# Patient Record
Sex: Female | Born: 1950 | Race: White | Hispanic: No | Marital: Married | Smoking: Never smoker
Health system: Southern US, Community
[De-identification: ages and names within clinical notes are randomized; demographics above are authoritative.]

## PROBLEM LIST (undated history)

## (undated) DIAGNOSIS — I447 Left bundle-branch block, unspecified: Secondary | ICD-10-CM

## (undated) DIAGNOSIS — F0390 Unspecified dementia without behavioral disturbance: Secondary | ICD-10-CM

## (undated) DIAGNOSIS — IMO0001 Reserved for inherently not codable concepts without codable children: Secondary | ICD-10-CM

## (undated) DIAGNOSIS — Z803 Family history of malignant neoplasm of breast: Secondary | ICD-10-CM

## (undated) DIAGNOSIS — I428 Other cardiomyopathies: Secondary | ICD-10-CM

## (undated) DIAGNOSIS — F419 Anxiety disorder, unspecified: Secondary | ICD-10-CM

## (undated) DIAGNOSIS — Z8619 Personal history of other infectious and parasitic diseases: Secondary | ICD-10-CM

## (undated) DIAGNOSIS — E785 Hyperlipidemia, unspecified: Secondary | ICD-10-CM

## (undated) DIAGNOSIS — Z9889 Other specified postprocedural states: Secondary | ICD-10-CM

## (undated) DIAGNOSIS — Z800 Family history of malignant neoplasm of digestive organs: Secondary | ICD-10-CM

## (undated) DIAGNOSIS — G43909 Migraine, unspecified, not intractable, without status migrainosus: Secondary | ICD-10-CM

## (undated) DIAGNOSIS — T7840XA Allergy, unspecified, initial encounter: Secondary | ICD-10-CM

## (undated) DIAGNOSIS — Z9011 Acquired absence of right breast and nipple: Secondary | ICD-10-CM

## (undated) DIAGNOSIS — I10 Essential (primary) hypertension: Secondary | ICD-10-CM

## (undated) DIAGNOSIS — I972 Postmastectomy lymphedema syndrome: Secondary | ICD-10-CM

## (undated) DIAGNOSIS — N83201 Unspecified ovarian cyst, right side: Secondary | ICD-10-CM

## (undated) DIAGNOSIS — J189 Pneumonia, unspecified organism: Secondary | ICD-10-CM

## (undated) DIAGNOSIS — C801 Malignant (primary) neoplasm, unspecified: Secondary | ICD-10-CM

## (undated) DIAGNOSIS — IMO0002 Reserved for concepts with insufficient information to code with codable children: Principal | ICD-10-CM

## (undated) DIAGNOSIS — R112 Nausea with vomiting, unspecified: Secondary | ICD-10-CM

## (undated) DIAGNOSIS — I5042 Chronic combined systolic (congestive) and diastolic (congestive) heart failure: Secondary | ICD-10-CM

## (undated) DIAGNOSIS — I340 Nonrheumatic mitral (valve) insufficiency: Secondary | ICD-10-CM

## (undated) DIAGNOSIS — K219 Gastro-esophageal reflux disease without esophagitis: Secondary | ICD-10-CM

## (undated) DIAGNOSIS — G4700 Insomnia, unspecified: Secondary | ICD-10-CM

## (undated) DIAGNOSIS — N83202 Unspecified ovarian cyst, left side: Principal | ICD-10-CM

## (undated) HISTORY — DX: Personal history of other infectious and parasitic diseases: Z86.19

## (undated) HISTORY — DX: Unspecified ovarian cyst, left side: N83.202

## (undated) HISTORY — PX: MASTECTOMY: SHX3

## (undated) HISTORY — PX: BREAST EXCISIONAL BIOPSY: SUR124

## (undated) HISTORY — DX: Left bundle-branch block, unspecified: I44.7

## (undated) HISTORY — DX: Reserved for inherently not codable concepts without codable children: IMO0001

## (undated) HISTORY — DX: Family history of malignant neoplasm of digestive organs: Z80.0

## (undated) HISTORY — PX: CARPAL TUNNEL RELEASE: SHX101

## (undated) HISTORY — PX: PORT-A-CATH REMOVAL: SHX5289

## (undated) HISTORY — DX: Family history of malignant neoplasm of breast: Z80.3

## (undated) HISTORY — PX: NASAL SINUS SURGERY: SHX719

## (undated) HISTORY — DX: Allergy, unspecified, initial encounter: T78.40XA

## (undated) HISTORY — DX: Hyperlipidemia, unspecified: E78.5

## (undated) HISTORY — PX: WRIST FRACTURE SURGERY: SHX121

## (undated) HISTORY — DX: Unspecified ovarian cyst, right side: N83.201

---

## 1989-07-12 HISTORY — PX: MANDIBLE SURGERY: SHX707

## 1998-03-20 ENCOUNTER — Other Ambulatory Visit: Admission: RE | Admit: 1998-03-20 | Discharge: 1998-03-20 | Payer: Self-pay | Admitting: Obstetrics and Gynecology

## 1998-12-09 ENCOUNTER — Ambulatory Visit (HOSPITAL_COMMUNITY): Admission: RE | Admit: 1998-12-09 | Discharge: 1998-12-09 | Payer: Self-pay | Admitting: Internal Medicine

## 1998-12-17 ENCOUNTER — Emergency Department (HOSPITAL_COMMUNITY): Admission: EM | Admit: 1998-12-17 | Discharge: 1998-12-17 | Payer: Self-pay | Admitting: Emergency Medicine

## 1998-12-17 ENCOUNTER — Encounter: Payer: Self-pay | Admitting: Emergency Medicine

## 1999-04-17 ENCOUNTER — Other Ambulatory Visit: Admission: RE | Admit: 1999-04-17 | Discharge: 1999-04-17 | Payer: Self-pay | Admitting: Obstetrics and Gynecology

## 2000-03-10 ENCOUNTER — Encounter: Payer: Self-pay | Admitting: Emergency Medicine

## 2000-03-10 ENCOUNTER — Emergency Department (HOSPITAL_COMMUNITY): Admission: EM | Admit: 2000-03-10 | Discharge: 2000-03-10 | Payer: Self-pay | Admitting: Emergency Medicine

## 2000-04-08 ENCOUNTER — Ambulatory Visit (HOSPITAL_COMMUNITY): Admission: RE | Admit: 2000-04-08 | Discharge: 2000-04-08 | Payer: Self-pay | Admitting: Internal Medicine

## 2000-04-08 ENCOUNTER — Encounter: Payer: Self-pay | Admitting: Internal Medicine

## 2000-04-14 ENCOUNTER — Encounter (INDEPENDENT_AMBULATORY_CARE_PROVIDER_SITE_OTHER): Payer: Self-pay | Admitting: *Deleted

## 2000-04-14 ENCOUNTER — Ambulatory Visit (HOSPITAL_BASED_OUTPATIENT_CLINIC_OR_DEPARTMENT_OTHER): Admission: RE | Admit: 2000-04-14 | Discharge: 2000-04-14 | Payer: Self-pay | Admitting: Surgery

## 2000-04-28 ENCOUNTER — Other Ambulatory Visit: Admission: RE | Admit: 2000-04-28 | Discharge: 2000-04-28 | Payer: Self-pay | Admitting: Obstetrics and Gynecology

## 2001-06-21 ENCOUNTER — Other Ambulatory Visit: Admission: RE | Admit: 2001-06-21 | Discharge: 2001-06-21 | Payer: Self-pay | Admitting: Obstetrics and Gynecology

## 2002-02-15 ENCOUNTER — Ambulatory Visit (HOSPITAL_COMMUNITY): Admission: RE | Admit: 2002-02-15 | Discharge: 2002-02-15 | Payer: Self-pay | Admitting: Family Medicine

## 2002-02-15 ENCOUNTER — Encounter: Payer: Self-pay | Admitting: Family Medicine

## 2002-02-15 ENCOUNTER — Encounter: Admission: RE | Admit: 2002-02-15 | Discharge: 2002-02-15 | Payer: Self-pay | Admitting: Family Medicine

## 2002-07-12 DIAGNOSIS — Z9011 Acquired absence of right breast and nipple: Secondary | ICD-10-CM

## 2002-07-12 HISTORY — PX: OTHER SURGICAL HISTORY: SHX169

## 2002-07-12 HISTORY — DX: Acquired absence of right breast and nipple: Z90.11

## 2002-07-26 ENCOUNTER — Other Ambulatory Visit: Admission: RE | Admit: 2002-07-26 | Discharge: 2002-07-26 | Payer: Self-pay | Admitting: Obstetrics and Gynecology

## 2002-08-31 ENCOUNTER — Encounter: Payer: Self-pay | Admitting: Obstetrics and Gynecology

## 2002-08-31 ENCOUNTER — Encounter: Admission: RE | Admit: 2002-08-31 | Discharge: 2002-08-31 | Payer: Self-pay | Admitting: Obstetrics and Gynecology

## 2002-08-31 ENCOUNTER — Encounter (INDEPENDENT_AMBULATORY_CARE_PROVIDER_SITE_OTHER): Payer: Self-pay | Admitting: *Deleted

## 2002-08-31 ENCOUNTER — Other Ambulatory Visit: Admission: RE | Admit: 2002-08-31 | Discharge: 2002-08-31 | Payer: Self-pay | Admitting: Radiology

## 2002-09-10 ENCOUNTER — Ambulatory Visit (HOSPITAL_COMMUNITY): Admission: RE | Admit: 2002-09-10 | Discharge: 2002-09-11 | Payer: Self-pay | Admitting: Surgery

## 2002-09-10 ENCOUNTER — Encounter: Payer: Self-pay | Admitting: Surgery

## 2002-09-11 ENCOUNTER — Encounter: Payer: Self-pay | Admitting: Surgery

## 2002-09-14 ENCOUNTER — Encounter: Admission: RE | Admit: 2002-09-14 | Discharge: 2002-09-14 | Payer: Self-pay | Admitting: Otolaryngology

## 2002-09-14 ENCOUNTER — Ambulatory Visit (HOSPITAL_COMMUNITY): Admission: RE | Admit: 2002-09-14 | Discharge: 2002-09-14 | Payer: Self-pay | Admitting: Surgery

## 2002-09-14 ENCOUNTER — Encounter: Payer: Self-pay | Admitting: Surgery

## 2002-09-17 ENCOUNTER — Ambulatory Visit (HOSPITAL_BASED_OUTPATIENT_CLINIC_OR_DEPARTMENT_OTHER): Admission: RE | Admit: 2002-09-17 | Discharge: 2002-09-18 | Payer: Self-pay | Admitting: Surgery

## 2002-09-17 ENCOUNTER — Encounter (INDEPENDENT_AMBULATORY_CARE_PROVIDER_SITE_OTHER): Payer: Self-pay | Admitting: *Deleted

## 2002-09-17 ENCOUNTER — Encounter: Payer: Self-pay | Admitting: Surgery

## 2002-10-01 ENCOUNTER — Encounter: Payer: Self-pay | Admitting: Oncology

## 2002-10-01 ENCOUNTER — Ambulatory Visit: Admission: RE | Admit: 2002-10-01 | Discharge: 2002-10-25 | Payer: Self-pay | Admitting: *Deleted

## 2002-10-01 ENCOUNTER — Encounter: Admission: RE | Admit: 2002-10-01 | Discharge: 2002-10-01 | Payer: Self-pay | Admitting: Oncology

## 2002-10-01 ENCOUNTER — Ambulatory Visit (HOSPITAL_COMMUNITY): Admission: RE | Admit: 2002-10-01 | Discharge: 2002-10-01 | Payer: Self-pay | Admitting: Oncology

## 2002-10-02 ENCOUNTER — Ambulatory Visit (HOSPITAL_COMMUNITY): Admission: RE | Admit: 2002-10-02 | Discharge: 2002-10-02 | Payer: Self-pay | Admitting: Oncology

## 2002-10-02 ENCOUNTER — Encounter: Payer: Self-pay | Admitting: Oncology

## 2002-11-13 ENCOUNTER — Encounter: Payer: Self-pay | Admitting: Surgery

## 2002-11-13 ENCOUNTER — Ambulatory Visit (HOSPITAL_BASED_OUTPATIENT_CLINIC_OR_DEPARTMENT_OTHER): Admission: RE | Admit: 2002-11-13 | Discharge: 2002-11-14 | Payer: Self-pay | Admitting: Surgery

## 2002-11-13 ENCOUNTER — Encounter (INDEPENDENT_AMBULATORY_CARE_PROVIDER_SITE_OTHER): Payer: Self-pay | Admitting: *Deleted

## 2002-11-23 ENCOUNTER — Encounter: Payer: Self-pay | Admitting: Oncology

## 2002-11-23 ENCOUNTER — Ambulatory Visit (HOSPITAL_COMMUNITY): Admission: RE | Admit: 2002-11-23 | Discharge: 2002-11-23 | Payer: Self-pay | Admitting: Oncology

## 2002-11-23 ENCOUNTER — Encounter: Payer: Self-pay | Admitting: Cardiology

## 2003-02-12 ENCOUNTER — Encounter: Payer: Self-pay | Admitting: Oncology

## 2003-02-12 ENCOUNTER — Encounter: Admission: RE | Admit: 2003-02-12 | Discharge: 2003-02-12 | Payer: Self-pay | Admitting: Oncology

## 2003-05-14 ENCOUNTER — Ambulatory Visit: Admission: RE | Admit: 2003-05-14 | Discharge: 2003-08-09 | Payer: Self-pay | Admitting: *Deleted

## 2003-05-17 ENCOUNTER — Ambulatory Visit (HOSPITAL_COMMUNITY): Admission: RE | Admit: 2003-05-17 | Discharge: 2003-05-17 | Payer: Self-pay | Admitting: Oncology

## 2003-08-27 ENCOUNTER — Other Ambulatory Visit: Admission: RE | Admit: 2003-08-27 | Discharge: 2003-08-27 | Payer: Self-pay | Admitting: Obstetrics and Gynecology

## 2003-09-06 ENCOUNTER — Ambulatory Visit: Admission: RE | Admit: 2003-09-06 | Discharge: 2003-09-06 | Payer: Self-pay | Admitting: *Deleted

## 2003-09-13 ENCOUNTER — Encounter: Payer: Self-pay | Admitting: Internal Medicine

## 2003-09-13 ENCOUNTER — Ambulatory Visit: Admission: RE | Admit: 2003-09-13 | Discharge: 2003-09-13 | Payer: Self-pay | Admitting: Oncology

## 2003-10-01 ENCOUNTER — Encounter: Admission: RE | Admit: 2003-10-01 | Discharge: 2003-10-01 | Payer: Self-pay | Admitting: Oncology

## 2003-10-29 ENCOUNTER — Ambulatory Visit (HOSPITAL_BASED_OUTPATIENT_CLINIC_OR_DEPARTMENT_OTHER): Admission: RE | Admit: 2003-10-29 | Discharge: 2003-10-29 | Payer: Self-pay | Admitting: Surgery

## 2004-01-29 ENCOUNTER — Other Ambulatory Visit: Admission: RE | Admit: 2004-01-29 | Discharge: 2004-01-29 | Payer: Self-pay | Admitting: Obstetrics and Gynecology

## 2004-07-22 ENCOUNTER — Ambulatory Visit: Payer: Self-pay | Admitting: Oncology

## 2004-08-19 ENCOUNTER — Encounter: Admission: RE | Admit: 2004-08-19 | Discharge: 2004-11-17 | Payer: Self-pay | Admitting: Oncology

## 2004-09-04 ENCOUNTER — Other Ambulatory Visit: Admission: RE | Admit: 2004-09-04 | Discharge: 2004-09-04 | Payer: Self-pay | Admitting: Obstetrics and Gynecology

## 2004-09-17 ENCOUNTER — Ambulatory Visit (HOSPITAL_COMMUNITY): Admission: RE | Admit: 2004-09-17 | Discharge: 2004-09-17 | Payer: Self-pay | Admitting: Oncology

## 2004-10-01 ENCOUNTER — Encounter: Admission: RE | Admit: 2004-10-01 | Discharge: 2004-10-01 | Payer: Self-pay | Admitting: Surgery

## 2004-12-29 ENCOUNTER — Emergency Department (HOSPITAL_COMMUNITY): Admission: EM | Admit: 2004-12-29 | Discharge: 2004-12-29 | Payer: Self-pay | Admitting: Family Medicine

## 2005-01-25 ENCOUNTER — Ambulatory Visit: Payer: Self-pay | Admitting: Oncology

## 2005-02-02 ENCOUNTER — Encounter: Admission: RE | Admit: 2005-02-02 | Discharge: 2005-02-02 | Payer: Self-pay | Admitting: Oncology

## 2005-02-28 ENCOUNTER — Emergency Department (HOSPITAL_COMMUNITY): Admission: EM | Admit: 2005-02-28 | Discharge: 2005-02-28 | Payer: Self-pay | Admitting: Family Medicine

## 2005-03-05 ENCOUNTER — Other Ambulatory Visit: Admission: RE | Admit: 2005-03-05 | Discharge: 2005-03-05 | Payer: Self-pay | Admitting: Obstetrics and Gynecology

## 2005-03-09 ENCOUNTER — Ambulatory Visit (HOSPITAL_COMMUNITY): Admission: RE | Admit: 2005-03-09 | Discharge: 2005-03-09 | Payer: Self-pay | Admitting: Family Medicine

## 2005-04-06 ENCOUNTER — Ambulatory Visit: Payer: Self-pay | Admitting: Oncology

## 2005-04-14 ENCOUNTER — Ambulatory Visit (HOSPITAL_COMMUNITY): Admission: RE | Admit: 2005-04-14 | Discharge: 2005-04-14 | Payer: Self-pay | Admitting: Neurology

## 2005-05-04 ENCOUNTER — Encounter: Admission: RE | Admit: 2005-05-04 | Discharge: 2005-07-07 | Payer: Self-pay | Admitting: Oncology

## 2005-09-16 ENCOUNTER — Other Ambulatory Visit: Admission: RE | Admit: 2005-09-16 | Discharge: 2005-09-16 | Payer: Self-pay | Admitting: Obstetrics & Gynecology

## 2005-10-04 ENCOUNTER — Encounter: Admission: RE | Admit: 2005-10-04 | Discharge: 2005-10-04 | Payer: Self-pay | Admitting: Surgery

## 2005-11-09 ENCOUNTER — Ambulatory Visit: Payer: Self-pay | Admitting: Oncology

## 2005-11-10 LAB — CBC WITH DIFFERENTIAL/PLATELET
BASO%: 0.7 % (ref 0.0–2.0)
EOS%: 5.1 % (ref 0.0–7.0)
HCT: 38.7 % (ref 34.8–46.6)
LYMPH%: 34.3 % (ref 14.0–48.0)
MCH: 30.5 pg (ref 26.0–34.0)
MCHC: 34.5 g/dL (ref 32.0–36.0)
MCV: 88.4 fL (ref 81.0–101.0)
MONO#: 0.4 10*3/uL (ref 0.1–0.9)
MONO%: 6.7 % (ref 0.0–13.0)
NEUT%: 53.2 % (ref 39.6–76.8)
Platelets: 287 10*3/uL (ref 145–400)
RBC: 4.38 10*6/uL (ref 3.70–5.32)
WBC: 6.3 10*3/uL (ref 3.9–10.0)

## 2005-11-10 LAB — COMPREHENSIVE METABOLIC PANEL
ALT: 28 U/L (ref 0–40)
Alkaline Phosphatase: 94 U/L (ref 39–117)
Creatinine, Ser: 0.9 mg/dL (ref 0.4–1.2)
Sodium: 140 mEq/L (ref 135–145)
Total Bilirubin: 0.3 mg/dL (ref 0.3–1.2)
Total Protein: 7 g/dL (ref 6.0–8.3)

## 2005-11-11 ENCOUNTER — Ambulatory Visit (HOSPITAL_COMMUNITY): Admission: RE | Admit: 2005-11-11 | Discharge: 2005-11-11 | Payer: Self-pay | Admitting: Oncology

## 2005-11-29 ENCOUNTER — Ambulatory Visit (HOSPITAL_COMMUNITY): Admission: RE | Admit: 2005-11-29 | Discharge: 2005-11-29 | Payer: Self-pay | Admitting: Oncology

## 2005-11-29 ENCOUNTER — Encounter: Payer: Self-pay | Admitting: Vascular Surgery

## 2005-12-13 ENCOUNTER — Encounter: Admission: RE | Admit: 2005-12-13 | Discharge: 2006-03-13 | Payer: Self-pay | Admitting: Oncology

## 2006-03-14 ENCOUNTER — Encounter: Admission: RE | Admit: 2006-03-14 | Discharge: 2006-06-12 | Payer: Self-pay | Admitting: Oncology

## 2006-05-08 ENCOUNTER — Emergency Department (HOSPITAL_COMMUNITY): Admission: EM | Admit: 2006-05-08 | Discharge: 2006-05-08 | Payer: Self-pay | Admitting: Family Medicine

## 2006-05-18 ENCOUNTER — Ambulatory Visit: Payer: Self-pay | Admitting: Oncology

## 2006-05-20 ENCOUNTER — Encounter: Admission: RE | Admit: 2006-05-20 | Discharge: 2006-05-20 | Payer: Self-pay | Admitting: Oncology

## 2006-05-20 LAB — CBC WITH DIFFERENTIAL/PLATELET
BASO%: 1.3 % (ref 0.0–2.0)
EOS%: 3.9 % (ref 0.0–7.0)
LYMPH%: 34.1 % (ref 14.0–48.0)
MCHC: 34.4 g/dL (ref 32.0–36.0)
MONO#: 0.4 10*3/uL (ref 0.1–0.9)
RBC: 4.55 10*6/uL (ref 3.70–5.32)
WBC: 5.6 10*3/uL (ref 3.9–10.0)
lymph#: 1.9 10*3/uL (ref 0.9–3.3)

## 2006-05-31 ENCOUNTER — Encounter: Admission: RE | Admit: 2006-05-31 | Discharge: 2006-08-29 | Payer: Self-pay | Admitting: Oncology

## 2006-06-01 LAB — COMPREHENSIVE METABOLIC PANEL
ALT: 20 U/L (ref 0–35)
AST: 21 U/L (ref 0–37)
CO2: 31 mEq/L (ref 19–32)
Chloride: 100 mEq/L (ref 96–112)
Sodium: 140 mEq/L (ref 135–145)
Total Bilirubin: 0.4 mg/dL (ref 0.3–1.2)
Total Protein: 7.4 g/dL (ref 6.0–8.3)

## 2006-06-01 LAB — CANCER ANTIGEN 27.29: CA 27.29: 26 U/mL (ref 0–39)

## 2006-06-07 ENCOUNTER — Encounter: Admission: RE | Admit: 2006-06-07 | Discharge: 2006-06-07 | Payer: Self-pay | Admitting: Family Medicine

## 2006-06-08 LAB — BASIC METABOLIC PANEL
BUN: 16 mg/dL (ref 6–23)
CO2: 26 mEq/L (ref 19–32)
Glucose, Bld: 99 mg/dL (ref 70–99)
Potassium: 4.1 mEq/L (ref 3.5–5.3)

## 2006-06-17 ENCOUNTER — Emergency Department (HOSPITAL_COMMUNITY): Admission: EM | Admit: 2006-06-17 | Discharge: 2006-06-17 | Payer: Self-pay | Admitting: Emergency Medicine

## 2006-08-30 ENCOUNTER — Encounter: Admission: RE | Admit: 2006-08-30 | Discharge: 2006-11-28 | Payer: Self-pay | Admitting: Oncology

## 2006-09-25 ENCOUNTER — Emergency Department (HOSPITAL_COMMUNITY): Admission: EM | Admit: 2006-09-25 | Discharge: 2006-09-25 | Payer: Self-pay | Admitting: Family Medicine

## 2006-10-07 ENCOUNTER — Encounter: Admission: RE | Admit: 2006-10-07 | Discharge: 2006-10-07 | Payer: Self-pay | Admitting: Surgery

## 2006-10-23 ENCOUNTER — Emergency Department (HOSPITAL_COMMUNITY): Admission: EM | Admit: 2006-10-23 | Discharge: 2006-10-23 | Payer: Self-pay | Admitting: Family Medicine

## 2006-10-27 ENCOUNTER — Encounter: Payer: Self-pay | Admitting: Cardiology

## 2006-11-10 ENCOUNTER — Ambulatory Visit: Payer: Self-pay | Admitting: Oncology

## 2006-11-11 ENCOUNTER — Emergency Department (HOSPITAL_COMMUNITY): Admission: EM | Admit: 2006-11-11 | Discharge: 2006-11-12 | Payer: Self-pay | Admitting: Emergency Medicine

## 2006-11-14 LAB — CBC WITH DIFFERENTIAL/PLATELET
BASO%: 0.9 % (ref 0.0–2.0)
Basophils Absolute: 0.1 10*3/uL (ref 0.0–0.1)
EOS%: 4.1 % (ref 0.0–7.0)
HCT: 36.2 % (ref 34.8–46.6)
HGB: 12.9 g/dL (ref 11.6–15.9)
LYMPH%: 33.7 % (ref 14.0–48.0)
MCH: 30.7 pg (ref 26.0–34.0)
MCHC: 35.7 g/dL (ref 32.0–36.0)
MONO#: 0.4 10*3/uL (ref 0.1–0.9)
NEUT%: 53.7 % (ref 39.6–76.8)
Platelets: 284 10*3/uL (ref 145–400)
lymph#: 1.8 10*3/uL (ref 0.9–3.3)

## 2006-11-17 LAB — FOLLICLE STIMULATING HORMONE: FSH: 88.5 m[IU]/mL

## 2006-11-17 LAB — VITAMIN D PNL(25-HYDRXY+1,25-DIHY)-BLD: Vit D, 25-Hydroxy: 30 ng/mL (ref 20–57)

## 2006-11-17 LAB — COMPREHENSIVE METABOLIC PANEL
ALT: 25 U/L (ref 0–35)
BUN: 16 mg/dL (ref 6–23)
CO2: 26 mEq/L (ref 19–32)
Calcium: 9 mg/dL (ref 8.4–10.5)
Chloride: 107 mEq/L (ref 96–112)
Creatinine, Ser: 0.81 mg/dL (ref 0.40–1.20)
Total Bilirubin: 0.3 mg/dL (ref 0.3–1.2)

## 2006-11-17 LAB — CANCER ANTIGEN 27.29: CA 27.29: 29 U/mL (ref 0–39)

## 2006-11-22 ENCOUNTER — Other Ambulatory Visit: Admission: RE | Admit: 2006-11-22 | Discharge: 2006-11-22 | Payer: Self-pay | Admitting: Obstetrics and Gynecology

## 2006-12-15 ENCOUNTER — Ambulatory Visit (HOSPITAL_COMMUNITY): Admission: RE | Admit: 2006-12-15 | Discharge: 2006-12-15 | Payer: Self-pay | Admitting: Oncology

## 2007-01-25 ENCOUNTER — Encounter: Admission: RE | Admit: 2007-01-25 | Discharge: 2007-04-25 | Payer: Self-pay | Admitting: Oncology

## 2007-04-26 ENCOUNTER — Encounter: Admission: RE | Admit: 2007-04-26 | Discharge: 2007-06-29 | Payer: Self-pay | Admitting: Oncology

## 2007-05-22 ENCOUNTER — Ambulatory Visit (HOSPITAL_COMMUNITY): Admission: RE | Admit: 2007-05-22 | Discharge: 2007-05-22 | Payer: Self-pay | Admitting: Family Medicine

## 2007-05-22 ENCOUNTER — Encounter: Admission: RE | Admit: 2007-05-22 | Discharge: 2007-05-22 | Payer: Self-pay | Admitting: Oncology

## 2007-05-23 ENCOUNTER — Ambulatory Visit: Payer: Self-pay | Admitting: Oncology

## 2007-05-25 LAB — CBC WITH DIFFERENTIAL/PLATELET
Eosinophils Absolute: 0.3 10*3/uL (ref 0.0–0.5)
LYMPH%: 39.4 % (ref 14.0–48.0)
MCV: 85.4 fL (ref 81.0–101.0)
MONO%: 7.9 % (ref 0.0–13.0)
NEUT#: 2.7 10*3/uL (ref 1.5–6.5)
Platelets: 260 10*3/uL (ref 145–400)
RBC: 4.81 10*6/uL (ref 3.70–5.32)

## 2007-05-25 LAB — COMPREHENSIVE METABOLIC PANEL
Alkaline Phosphatase: 74 U/L (ref 39–117)
BUN: 13 mg/dL (ref 6–23)
Glucose, Bld: 107 mg/dL — ABNORMAL HIGH (ref 70–99)
Sodium: 139 mEq/L (ref 135–145)
Total Bilirubin: 0.3 mg/dL (ref 0.3–1.2)
Total Protein: 7.1 g/dL (ref 6.0–8.3)

## 2007-05-29 LAB — VITAMIN D PNL(25-HYDRXY+1,25-DIHY)-BLD
Vit D, 1,25-Dihydroxy: 31 pg/mL (ref 6–62)
Vit D, 25-Hydroxy: 32 ng/mL (ref 30–89)

## 2007-07-17 ENCOUNTER — Encounter: Admission: RE | Admit: 2007-07-17 | Discharge: 2007-09-27 | Payer: Self-pay | Admitting: Orthopedic Surgery

## 2007-09-25 ENCOUNTER — Encounter: Admission: RE | Admit: 2007-09-25 | Discharge: 2007-10-30 | Payer: Self-pay | Admitting: Oncology

## 2007-10-13 ENCOUNTER — Ambulatory Visit (HOSPITAL_COMMUNITY): Admission: RE | Admit: 2007-10-13 | Discharge: 2007-10-13 | Payer: Self-pay | Admitting: *Deleted

## 2007-10-18 ENCOUNTER — Encounter: Payer: Self-pay | Admitting: Cardiology

## 2007-11-17 ENCOUNTER — Encounter: Payer: Self-pay | Admitting: Cardiology

## 2007-11-27 ENCOUNTER — Other Ambulatory Visit: Admission: RE | Admit: 2007-11-27 | Discharge: 2007-11-27 | Payer: Self-pay | Admitting: Obstetrics and Gynecology

## 2008-01-02 ENCOUNTER — Encounter: Admission: RE | Admit: 2008-01-02 | Discharge: 2008-01-02 | Payer: Self-pay | Admitting: Surgery

## 2008-04-16 ENCOUNTER — Ambulatory Visit (HOSPITAL_COMMUNITY): Admission: RE | Admit: 2008-04-16 | Discharge: 2008-04-16 | Payer: Self-pay | Admitting: Family Medicine

## 2008-04-19 ENCOUNTER — Ambulatory Visit (HOSPITAL_COMMUNITY): Admission: RE | Admit: 2008-04-19 | Discharge: 2008-04-19 | Payer: Self-pay | Admitting: Family Medicine

## 2008-04-19 ENCOUNTER — Ambulatory Visit: Payer: Self-pay | Admitting: Oncology

## 2008-04-23 LAB — CBC WITH DIFFERENTIAL/PLATELET
Basophils Absolute: 0.1 10*3/uL (ref 0.0–0.1)
Eosinophils Absolute: 0.4 10*3/uL (ref 0.0–0.5)
HGB: 14 g/dL (ref 11.6–15.9)
MCV: 84.7 fL (ref 81.0–101.0)
MONO%: 7.1 % (ref 0.0–13.0)
NEUT#: 4 10*3/uL (ref 1.5–6.5)
RDW: 12.7 % (ref 11.3–14.5)
lymph#: 2.3 10*3/uL (ref 0.9–3.3)

## 2008-04-24 LAB — COMPREHENSIVE METABOLIC PANEL
Albumin: 4.3 g/dL (ref 3.5–5.2)
BUN: 23 mg/dL (ref 6–23)
CO2: 27 mEq/L (ref 19–32)
Calcium: 9.4 mg/dL (ref 8.4–10.5)
Chloride: 103 mEq/L (ref 96–112)
Glucose, Bld: 85 mg/dL (ref 70–99)
Potassium: 4.2 mEq/L (ref 3.5–5.3)

## 2008-04-24 LAB — VITAMIN D 25 HYDROXY (VIT D DEFICIENCY, FRACTURES): Vit D, 25-Hydroxy: 29 ng/mL — ABNORMAL LOW (ref 30–89)

## 2008-08-02 ENCOUNTER — Emergency Department (HOSPITAL_COMMUNITY): Admission: EM | Admit: 2008-08-02 | Discharge: 2008-08-02 | Payer: Self-pay | Admitting: Family Medicine

## 2008-09-27 ENCOUNTER — Ambulatory Visit: Payer: Self-pay | Admitting: Oncology

## 2008-10-01 LAB — CBC WITH DIFFERENTIAL/PLATELET
Basophils Absolute: 0 10*3/uL (ref 0.0–0.1)
Eosinophils Absolute: 0.2 10*3/uL (ref 0.0–0.5)
HCT: 39.3 % (ref 34.8–46.6)
HGB: 13.4 g/dL (ref 11.6–15.9)
LYMPH%: 22.5 % (ref 14.0–49.7)
MCV: 87.3 fL (ref 79.5–101.0)
MONO%: 4.6 % (ref 0.0–14.0)
NEUT#: 5.6 10*3/uL (ref 1.5–6.5)
NEUT%: 70.3 % (ref 38.4–76.8)
Platelets: 246 10*3/uL (ref 145–400)
RDW: 13.8 % (ref 11.2–14.5)

## 2008-10-01 LAB — COMPREHENSIVE METABOLIC PANEL
Albumin: 3.6 g/dL (ref 3.5–5.2)
Alkaline Phosphatase: 61 U/L (ref 39–117)
BUN: 11 mg/dL (ref 6–23)
Glucose, Bld: 87 mg/dL (ref 70–99)
Potassium: 4.4 mEq/L (ref 3.5–5.3)

## 2008-11-06 ENCOUNTER — Ambulatory Visit: Payer: Self-pay | Admitting: Vascular Surgery

## 2008-11-06 ENCOUNTER — Inpatient Hospital Stay (HOSPITAL_COMMUNITY): Admission: EM | Admit: 2008-11-06 | Discharge: 2008-11-08 | Payer: Self-pay | Admitting: Emergency Medicine

## 2008-11-06 ENCOUNTER — Encounter (INDEPENDENT_AMBULATORY_CARE_PROVIDER_SITE_OTHER): Payer: Self-pay | Admitting: Internal Medicine

## 2008-11-27 ENCOUNTER — Encounter: Payer: Self-pay | Admitting: Cardiology

## 2008-12-12 ENCOUNTER — Encounter: Admission: RE | Admit: 2008-12-12 | Discharge: 2008-12-12 | Payer: Self-pay | Admitting: Otolaryngology

## 2009-01-02 ENCOUNTER — Encounter: Admission: RE | Admit: 2009-01-02 | Discharge: 2009-01-02 | Payer: Self-pay | Admitting: Surgery

## 2009-01-08 ENCOUNTER — Encounter: Admission: RE | Admit: 2009-01-08 | Discharge: 2009-01-08 | Payer: Self-pay | Admitting: Surgery

## 2009-01-20 ENCOUNTER — Ambulatory Visit (HOSPITAL_COMMUNITY): Admission: RE | Admit: 2009-01-20 | Discharge: 2009-01-20 | Payer: Self-pay | Admitting: Oncology

## 2009-01-23 ENCOUNTER — Ambulatory Visit (HOSPITAL_COMMUNITY): Admission: RE | Admit: 2009-01-23 | Discharge: 2009-01-23 | Payer: Self-pay | Admitting: Family Medicine

## 2009-01-24 ENCOUNTER — Ambulatory Visit (HOSPITAL_COMMUNITY): Admission: RE | Admit: 2009-01-24 | Discharge: 2009-01-24 | Payer: Self-pay | Admitting: Family Medicine

## 2009-02-11 ENCOUNTER — Encounter: Admission: RE | Admit: 2009-02-11 | Discharge: 2009-02-11 | Payer: Self-pay | Admitting: Gastroenterology

## 2009-02-15 ENCOUNTER — Emergency Department (HOSPITAL_COMMUNITY): Admission: EM | Admit: 2009-02-15 | Discharge: 2009-02-15 | Payer: Self-pay | Admitting: Family Medicine

## 2009-04-19 ENCOUNTER — Observation Stay (HOSPITAL_COMMUNITY): Admission: EM | Admit: 2009-04-19 | Discharge: 2009-04-21 | Payer: Self-pay | Admitting: Emergency Medicine

## 2009-04-21 ENCOUNTER — Ambulatory Visit: Payer: Self-pay | Admitting: Vascular Surgery

## 2009-04-21 ENCOUNTER — Encounter (INDEPENDENT_AMBULATORY_CARE_PROVIDER_SITE_OTHER): Payer: Self-pay | Admitting: Internal Medicine

## 2009-05-22 ENCOUNTER — Encounter: Admission: RE | Admit: 2009-05-22 | Discharge: 2009-05-22 | Payer: Self-pay | Admitting: Oncology

## 2009-05-29 ENCOUNTER — Ambulatory Visit: Payer: Self-pay | Admitting: Oncology

## 2009-06-03 LAB — CBC WITH DIFFERENTIAL/PLATELET
Basophils Absolute: 0 10*3/uL (ref 0.0–0.1)
Eosinophils Absolute: 0.3 10*3/uL (ref 0.0–0.5)
HCT: 37.6 % (ref 34.8–46.6)
HGB: 12.9 g/dL (ref 11.6–15.9)
MONO#: 0.4 10*3/uL (ref 0.1–0.9)
NEUT#: 2.4 10*3/uL (ref 1.5–6.5)
RDW: 14 % (ref 11.2–14.5)
lymph#: 2.1 10*3/uL (ref 0.9–3.3)

## 2009-06-03 LAB — COMPREHENSIVE METABOLIC PANEL
Alkaline Phosphatase: 69 U/L (ref 39–117)
CO2: 29 mEq/L (ref 19–32)
Creatinine, Ser: 0.91 mg/dL (ref 0.40–1.20)
Glucose, Bld: 91 mg/dL (ref 70–99)
Total Bilirubin: 0.3 mg/dL (ref 0.3–1.2)

## 2009-06-04 LAB — VITAMIN D 25 HYDROXY (VIT D DEFICIENCY, FRACTURES): Vit D, 25-Hydroxy: 33 ng/mL (ref 30–89)

## 2009-06-04 LAB — CANCER ANTIGEN 27.29: CA 27.29: 27 U/mL (ref 0–39)

## 2009-09-08 ENCOUNTER — Encounter: Payer: Self-pay | Admitting: Cardiology

## 2009-09-24 ENCOUNTER — Encounter (HOSPITAL_COMMUNITY): Admission: RE | Admit: 2009-09-24 | Discharge: 2009-10-24 | Payer: Self-pay | Admitting: Surgery

## 2009-09-30 DIAGNOSIS — E785 Hyperlipidemia, unspecified: Secondary | ICD-10-CM

## 2009-10-01 ENCOUNTER — Ambulatory Visit: Payer: Self-pay | Admitting: Cardiology

## 2009-10-01 DIAGNOSIS — R5383 Other fatigue: Secondary | ICD-10-CM

## 2009-10-01 DIAGNOSIS — R5381 Other malaise: Secondary | ICD-10-CM | POA: Insufficient documentation

## 2009-10-01 DIAGNOSIS — R0989 Other specified symptoms and signs involving the circulatory and respiratory systems: Secondary | ICD-10-CM | POA: Insufficient documentation

## 2009-10-01 DIAGNOSIS — R0600 Dyspnea, unspecified: Secondary | ICD-10-CM | POA: Insufficient documentation

## 2009-10-09 LAB — CONVERTED CEMR LAB
ALT: 32 units/L (ref 0–35)
AST: 26 units/L (ref 0–37)
Basophils Relative: 1.2 % (ref 0.0–3.0)
Bilirubin, Direct: 0 mg/dL (ref 0.0–0.3)
Calcium: 9.3 mg/dL (ref 8.4–10.5)
Chloride: 105 meq/L (ref 96–112)
Creatinine, Ser: 0.9 mg/dL (ref 0.4–1.2)
Direct LDL: 220.5 mg/dL
Eosinophils Relative: 4.9 % (ref 0.0–5.0)
HCT: 40.8 % (ref 36.0–46.0)
Monocytes Relative: 7.2 % (ref 3.0–12.0)
Neutrophils Relative %: 49.8 % (ref 43.0–77.0)
Platelets: 251 10*3/uL (ref 150.0–400.0)
Pro B Natriuretic peptide (BNP): 60 pg/mL (ref 0.0–100.0)
RBC: 4.54 M/uL (ref 3.87–5.11)
Sodium: 139 meq/L (ref 135–145)
TSH: 3.7 microintl units/mL (ref 0.35–5.50)
Total Bilirubin: 0.2 mg/dL — ABNORMAL LOW (ref 0.3–1.2)
Total CHOL/HDL Ratio: 5
Total Protein: 7 g/dL (ref 6.0–8.3)
WBC: 6 10*3/uL (ref 4.5–10.5)

## 2009-10-10 ENCOUNTER — Ambulatory Visit: Payer: Self-pay

## 2009-10-10 ENCOUNTER — Encounter: Payer: Self-pay | Admitting: Cardiology

## 2009-10-10 ENCOUNTER — Ambulatory Visit: Payer: Self-pay | Admitting: Cardiovascular Disease

## 2009-10-10 ENCOUNTER — Ambulatory Visit (HOSPITAL_COMMUNITY): Admission: RE | Admit: 2009-10-10 | Discharge: 2009-10-10 | Payer: Self-pay | Admitting: Cardiology

## 2009-10-14 ENCOUNTER — Ambulatory Visit: Payer: Self-pay | Admitting: Cardiology

## 2009-10-22 ENCOUNTER — Emergency Department (HOSPITAL_COMMUNITY): Admission: EM | Admit: 2009-10-22 | Discharge: 2009-10-23 | Payer: Self-pay | Admitting: Emergency Medicine

## 2009-10-23 ENCOUNTER — Telehealth: Payer: Self-pay | Admitting: Cardiology

## 2009-10-27 ENCOUNTER — Telehealth (INDEPENDENT_AMBULATORY_CARE_PROVIDER_SITE_OTHER): Payer: Self-pay | Admitting: *Deleted

## 2009-10-27 ENCOUNTER — Encounter (HOSPITAL_COMMUNITY): Admission: RE | Admit: 2009-10-27 | Discharge: 2009-11-26 | Payer: Self-pay | Admitting: Surgery

## 2009-10-28 ENCOUNTER — Ambulatory Visit: Payer: Self-pay | Admitting: Cardiovascular Disease

## 2009-10-28 ENCOUNTER — Encounter (HOSPITAL_COMMUNITY): Admission: RE | Admit: 2009-10-28 | Discharge: 2010-01-09 | Payer: Self-pay | Admitting: Cardiology

## 2009-10-28 ENCOUNTER — Ambulatory Visit: Payer: Self-pay | Admitting: Cardiology

## 2009-10-28 ENCOUNTER — Ambulatory Visit: Payer: Self-pay

## 2009-11-04 LAB — CONVERTED CEMR LAB
GFR calc non Af Amer: 68.17 mL/min (ref >60)
Potassium: 3.9 meq/L (ref 3.5–5.1)
Sodium: 142 meq/L (ref 135–145)

## 2009-11-06 ENCOUNTER — Encounter: Admission: RE | Admit: 2009-11-06 | Discharge: 2010-02-04 | Payer: Self-pay | Admitting: Otolaryngology

## 2009-11-13 ENCOUNTER — Ambulatory Visit: Payer: Self-pay | Admitting: Cardiology

## 2009-11-27 ENCOUNTER — Encounter (HOSPITAL_COMMUNITY): Admission: RE | Admit: 2009-11-27 | Discharge: 2009-12-27 | Payer: Self-pay | Admitting: Surgery

## 2009-12-11 ENCOUNTER — Emergency Department (HOSPITAL_COMMUNITY): Admission: EM | Admit: 2009-12-11 | Discharge: 2009-12-11 | Payer: Self-pay | Admitting: Emergency Medicine

## 2010-02-20 ENCOUNTER — Encounter: Admission: RE | Admit: 2010-02-20 | Discharge: 2010-02-20 | Payer: Self-pay | Admitting: Obstetrics and Gynecology

## 2010-02-24 ENCOUNTER — Ambulatory Visit (HOSPITAL_BASED_OUTPATIENT_CLINIC_OR_DEPARTMENT_OTHER): Admission: RE | Admit: 2010-02-24 | Discharge: 2010-02-24 | Payer: Self-pay | Admitting: Orthopedic Surgery

## 2010-04-29 ENCOUNTER — Encounter: Admission: RE | Admit: 2010-04-29 | Discharge: 2010-04-29 | Payer: Self-pay | Admitting: Oral Surgery

## 2010-05-04 ENCOUNTER — Ambulatory Visit: Payer: Self-pay | Admitting: Cardiology

## 2010-05-11 LAB — CONVERTED CEMR LAB
AST: 20 units/L (ref 0–37)
Alkaline Phosphatase: 66 units/L (ref 39–117)
Bilirubin, Direct: 0.1 mg/dL (ref 0.0–0.3)
Cholesterol: 225 mg/dL — ABNORMAL HIGH (ref 0–200)
Direct LDL: 145.4 mg/dL
Total Bilirubin: 0.5 mg/dL (ref 0.3–1.2)
Total CHOL/HDL Ratio: 6
VLDL: 46.4 mg/dL — ABNORMAL HIGH (ref 0.0–40.0)

## 2010-05-12 ENCOUNTER — Encounter: Payer: Self-pay | Admitting: Cardiology

## 2010-05-12 ENCOUNTER — Ambulatory Visit: Payer: Self-pay | Admitting: Cardiology

## 2010-05-19 ENCOUNTER — Telehealth (INDEPENDENT_AMBULATORY_CARE_PROVIDER_SITE_OTHER): Payer: Self-pay | Admitting: *Deleted

## 2010-05-26 ENCOUNTER — Telehealth: Payer: Self-pay | Admitting: Cardiology

## 2010-06-01 ENCOUNTER — Ambulatory Visit: Payer: Self-pay | Admitting: Oncology

## 2010-06-03 LAB — CBC WITH DIFFERENTIAL/PLATELET
BASO%: 1.1 % (ref 0.0–2.0)
Basophils Absolute: 0.1 10*3/uL (ref 0.0–0.1)
EOS%: 6.2 % (ref 0.0–7.0)
Eosinophils Absolute: 0.3 10*3/uL (ref 0.0–0.5)
HCT: 39.1 % (ref 34.8–46.6)
HGB: 13.6 g/dL (ref 11.6–15.9)
LYMPH%: 39.1 % (ref 14.0–49.7)
MCH: 30.3 pg (ref 25.1–34.0)
MCHC: 34.7 g/dL (ref 31.5–36.0)
MCV: 87.3 fL (ref 79.5–101.0)
MONO#: 0.3 10*3/uL (ref 0.1–0.9)
MONO%: 7 % (ref 0.0–14.0)
NEUT#: 2.2 10*3/uL (ref 1.5–6.5)
NEUT%: 46.6 % (ref 38.4–76.8)
Platelets: 262 10*3/uL (ref 145–400)
RBC: 4.48 10*6/uL (ref 3.70–5.45)
RDW: 13.3 % (ref 11.2–14.5)
WBC: 4.8 10*3/uL (ref 3.9–10.3)
lymph#: 1.9 10*3/uL (ref 0.9–3.3)

## 2010-06-04 LAB — COMPREHENSIVE METABOLIC PANEL
ALT: 21 U/L (ref 0–35)
AST: 22 U/L (ref 0–37)
Albumin: 4 g/dL (ref 3.5–5.2)
Alkaline Phosphatase: 72 U/L (ref 39–117)
BUN: 13 mg/dL (ref 6–23)
CO2: 28 mEq/L (ref 19–32)
Calcium: 9.4 mg/dL (ref 8.4–10.5)
Chloride: 104 mEq/L (ref 96–112)
Creatinine, Ser: 0.98 mg/dL (ref 0.40–1.20)
Glucose, Bld: 92 mg/dL (ref 70–99)
Potassium: 3.9 mEq/L (ref 3.5–5.3)
Sodium: 141 mEq/L (ref 135–145)
Total Bilirubin: 0.3 mg/dL (ref 0.3–1.2)
Total Protein: 6.4 g/dL (ref 6.0–8.3)

## 2010-06-04 LAB — VITAMIN D 25 HYDROXY (VIT D DEFICIENCY, FRACTURES): Vit D, 25-Hydroxy: 33 ng/mL (ref 30–89)

## 2010-06-04 LAB — CANCER ANTIGEN 27.29: CA 27.29: 31 U/mL (ref 0–39)

## 2010-06-26 ENCOUNTER — Encounter
Admission: RE | Admit: 2010-06-26 | Discharge: 2010-06-26 | Payer: Self-pay | Source: Home / Self Care | Attending: Orthopaedic Surgery | Admitting: Orthopaedic Surgery

## 2010-06-29 ENCOUNTER — Emergency Department (HOSPITAL_COMMUNITY)
Admission: EM | Admit: 2010-06-29 | Discharge: 2010-06-29 | Payer: Self-pay | Source: Home / Self Care | Admitting: Emergency Medicine

## 2010-07-16 ENCOUNTER — Telehealth: Payer: Self-pay | Admitting: Cardiology

## 2010-07-17 ENCOUNTER — Telehealth: Payer: Self-pay | Admitting: Cardiology

## 2010-07-20 ENCOUNTER — Telehealth: Payer: Self-pay | Admitting: Cardiology

## 2010-07-22 ENCOUNTER — Encounter (HOSPITAL_COMMUNITY)
Admission: RE | Admit: 2010-07-22 | Discharge: 2010-08-11 | Payer: Self-pay | Source: Home / Self Care | Attending: Orthopaedic Surgery | Admitting: Orthopaedic Surgery

## 2010-08-01 ENCOUNTER — Other Ambulatory Visit: Payer: Self-pay | Admitting: Oncology

## 2010-08-01 DIAGNOSIS — Z9010 Acquired absence of unspecified breast and nipple: Secondary | ICD-10-CM

## 2010-08-01 DIAGNOSIS — Z9011 Acquired absence of right breast and nipple: Secondary | ICD-10-CM

## 2010-08-02 ENCOUNTER — Encounter: Payer: Self-pay | Admitting: Oncology

## 2010-08-02 ENCOUNTER — Encounter: Payer: Self-pay | Admitting: Obstetrics and Gynecology

## 2010-08-03 ENCOUNTER — Encounter: Payer: Self-pay | Admitting: Family Medicine

## 2010-08-11 NOTE — Progress Notes (Signed)
Summary: sample  of zetia  Phone Note Call from Patient Call back at Home Phone [phone]   Caller: Patient c [phone] Reason for Call: Talk to Nurse Summary of Call: sample of zetia, pt will be in [place] today for physical therapy @ 10:30  Initial call taken by: Lorne Skeens,  May 26, 2010 8:24 AM  Follow-up for Phone Call        Pt aware of samples at front waiting for her. Stanton Kidney, EMT-P  May 26, 2010 10:08 AM

## 2010-08-11 NOTE — Cardiovascular Report (Signed)
Summary: [facility]   [facility]   Imported By: Roderic Ovens 10/20/2009 11:01:34  _____________________________________________________________________  External Attachment:    Type:   Image     Comment:   External Document

## 2010-08-11 NOTE — Progress Notes (Signed)
     Follow-up for Phone Call       Follow-up by: Judithe Modest CMA,  May 19, 2010 2:22 PM    Pt called very upset regarding 30 day supply sent into [facility] pharmacy on 05/15/2010.  [facility] shipped 30 day and charged her 50 dollars to do so.  When she called them the representative she talked to at [facility] got into a yelling match with her (per pt report) and refused to hear her complaint.  Pt does not wish to use [facility] ever again in the future and wants to go back to [facility].  I called [facility] to see if we could get the pt another 2 month supply for the rate of the 3 she should have initially been sent and was informed that it was going to be another 50 dollars to do it this way.  Then I was informed that there is nothing they could do about the fact that a 30 day supply had already been shipped.  They did however offer a one time credit of 25$ back to her account and this would cover the difference the pt was paying from [facility] pharmacy to [facility].  I spoke with the representative at length and put in another customer complaint for pt.  The representative I spoke to was very helpful in crediting the pts account   DO NOT SEND RX TO [facility]!!!!!!  Judithe Modest, CMA

## 2010-08-11 NOTE — Progress Notes (Signed)
Summary: Nuclear pre procedure  Phone Note Outgoing Call Call back at Home Phone [phone]   Call placed by: Rea College, CMA,  October 27, 2009 11:50 AM Call placed to: Patient Summary of Call: Reviewed information on Myoview Information Sheet (see scanned document for further details).  Spoke with patient.       Nuclear Med Background Indications for Stress Test: Evaluation for Ischemia   History: Echo, Heart Catheterization, History of Chemo, Myocardial Perfusion Study  History Comments: '09 [id number]; 5/09 Cath:normal, EF=60%;  10/10/09 Echo:EF=55%, mild-moderate MR/TR  Symptoms: DOE    Nuclear Pre-Procedure Cardiac Risk Factors: Family History - CAD, Lipids Height (in): 62.25 Tech Comments: Carotid bruit, no stenosis

## 2010-08-11 NOTE — Assessment & Plan Note (Signed)
Summary: Kylie Tucker   Primary Provider:  Dr. Regino Schultze   History of Present Illness: 60 yo with history of hyperlipidemia and strong family history of CAD returns for cardiology followup.  When I initially saw her, she reported new-onset exertional dyspnea.  Given these symptoms and her family history, we did a Lexiscan myoview (4/11) which showed no ischemia or infarction.  Echo was suggestive of diastolic dysfunction.  I started her on low dose of Lasix.  She is not sure that it has helped much.  She is still short of breath walking up a flight of steps but she is able to exercise regularly at the [facility].   As mentioned, multiple members of the patient's family have had premature CAD. She has hyperlipidemia but has been unable to tolerate Crestor or a second statin that she thinks was pravastatin due to muscle  pain.  LDL has been very high.  I started her on Livalo but she developed abdominal pain and vomiting while on this medication.  She stopped it, then restarted it, and the abdominal pain and vomiting returned.  She did not want to start Niaspan due to concerns about side effects.   She did start Zetia.   Labs (3/11): K 3.8, creatinine 0.9, BNP 60, HCT 40.8, HDL 64, LDL 220, TSH normal Labs (2/95): K 3.9, creatinine 0.9 Labs (10/11): HCT 38, LDL 145, TGs 232  Current Medications (verified): 1)  Effexor Xr 37.5 Mg Xr24h-Cap (Venlafaxine Hcl) .... Take One Capsule Once Daily 2)  Arimidex 1 Mg Tabs (Anastrozole) .... Take One Tablet Once Daily 3)  Protonix 40 Mg Tbec (Pantoprazole Sodium) .... As Needed For Reflux 4)  Clarinex 5 Mg Tabs (Desloratadine) .... Take One Tablet Once Daily 5)  Xyzal 5 Mg Tabs (Levocetirizine Dihydrochloride) .... Once Daily At Bedtime Kylie)  Nasonex 50 Mcg/act Susp (Mometasone Furoate) .... Uad At Bedtime 7)  Furosemide 20 Mg Tabs (Furosemide) .... One Tablet Daily 8)  Potassium Chloride Crys Cr 20 Meq Cr-Tabs (Potassium Chloride Crys Cr) .... Take One Tablet By  Mouth Daily 9)  Zetia 10 Mg Tabs (Ezetimibe) .... One Tablet Daily  Allergies (verified): 1)  ! * Biaxin 2)  ! * Codeine 3)  ! * Sulfa 4)  ! * Vicodin 5)  ! * Augmentin Kylie)  ! * Ceftin 7)  ! * Darvocet-N 100 8)  ! * Keflex 9)  ! * Levaquin 10)  ! * All Cillins 11)  ! * Pravastatin 12)  ! Crestor  Past History:  Past Medical History: Last updated: 11/13/2009 1. Breast cancer s/p right mastectomy as well as chemotherapy and radiation.  She has chronic right arm lymphedema. 2. Colon polyps 3. GERD 4. PUD: Avoids aspirin due to history of ulcer.  5. Cardiac workup in past with negative myoview in 2009.  Given her risk factors, she ended up having a catheterization anyway which showed no angiographic coronary disease and EF 60% (5/09).   Kylie. TAH (7/10) 7. Vertigo 8. Hyperlipidemia: She has been intolerant to Crestor and pravastatin (she thinks) in the past due to myalgias.  Livalo caused abdominal pain and vomiting.  9. Exertional dyspnea: echo (4/11) was suggestive of diastolic CHF, showing EF 55%, moderate (pseudonormal) diastolic dysfunction, mild-moderate MR, moderate TR, mild biatrial enlargement.  Lexiscan myoview was a normal study with EF 71% and no evidence for ischemia or infarction.  10.  Carotid US (3/11): no significant stenosis.   Family History: Reviewed history from 10/01/2009 and no changes required.  Kylie uncles had MIs, as early as in their 61s.  Father with MI in his 38s.  Mother with MI in her 39s.  Brother with diabetes.   Social History: Reviewed history from 10/01/2009 and no changes required. Tobacco Use - No.  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 Neck:  Neck supple, no JVD. No masses, thyromegaly or abnormal cervical nodes. Lungs:  Clear bilaterally to auscultation and percussion. Heart:  Non-displaced PMI, chest non-tender; regular rate and rhythm, S1, S2 without murmurs, rubs or gallops. Carotid upstroke normal, no bruit.  Pedals normal pulses. No edema, no varicosities. Abdomen:  Bowel sounds positive; abdomen soft and non-tender without masses, organomegaly, or hernias noted. No hepatosplenomegaly. Extremities:  No clubbing or cyanosis. Neurologic:  Alert and oriented x 3. Psych:  Normal affect.   Impression & Recommendations:  Problem # 1:  DYSPNEA ON EXERTION (ICD-786.09) Patient does have moderate diastolic dysfunction on echo.  Lexiscan myoview showed no ischemia or infarction.  I started her on Lasix.  She is not sure how much it has helped, but symptoms have not worsened. Continue current Lasix and KCl for now.  She needs to increase her exercise level and work on weight loss.   She never smoked and has no history of asthma.   Problem # 2:  HYPERLIPIDEMIA (ICD-272.4) Very high LDL initially.  Unable to tolerate Crestor, pravastatin, or Livalo.  She does not want to try Niaspan due to concern about side effects.  She has been on Zetia, and LDL has fallen from 220 => 145.  Continue Zetia.   Problem # 3:  CAROTID BRUIT (ICD-785.9) No significant carotid stenosis.  There is probably turbulent flow in a tortuous carotid causing the whooshing sound she hears.   Other Orders: EKG w/ Interpretation (93000)  Patient Instructions: 1)  Your physician recommends that you schedule a follow-up appointment as needed 2)  Your physician recommends that you continue on your current medications as directed. Please refer to the Current Medication list given to you today.

## 2010-08-11 NOTE — Letter (Signed)
Summary: [facility] Office Note  [facility] Office Note   Imported By: Roderic Ovens 10/20/2009 16:11:55  _____________________________________________________________________  External Attachment:    Type:   Image     Comment:   External Document

## 2010-08-11 NOTE — Assessment & Plan Note (Signed)
Summary: np6/dyspnea/family hx of early heart disease/jml   Primary Provider:  Dr. Nobie Putnam  CC:  new patient.  dyspnea and family Hx of heart disease.  Pt states she is out of breath going up steps.  Pt also states that she is hearing a "hard swooshing noise" when she lays on her right side.  Marland Kitchen  History of Present Illness: 60 yo with history of hyperlipidemia and strong family history of CAD presents for cardiology evaluation.  Patient has had a cardiac workup in the past, with normal myoview in 2009 done because of atypical chest pain.  This was followed by a normal heart catheterization, also in 2009.  Recently, the patient has noted exertional shortness of breath.  This has been going on for about 2 months.  She is short of breath after climbing a flight of steps or after walking less than 1 block.  She does not have any chest pain.  No orthopnea or PND. No congestion, coughing, wheezing, fever.  No history of asthma or smoking.  She also is concerned about a "whooshing" noise that she hears in her right ear while sleeping.    As mentioned, multiple members of the patient's family have had premature CAD. She has hyperlipidemia but has been unable to tolerate Crestor or a second statin that she thinks was pravastatin.  She is on Zetia.    ECG: NSR, normal.  Current Medications (verified): 1)  Effexor Xr 37.5 Mg Xr24h-Cap (Venlafaxine Hcl) .... Take One Capsule Once Daily 2)  Arimidex 1 Mg Tabs (Anastrozole) .... Take One Tablet Once Daily 3)  Protonix 40 Mg Tbec (Pantoprazole Sodium) .... As Needed For Reflux 4)  Clarinex 5 Mg Tabs (Desloratadine) .... Take One Tablet Once Daily 5)  Xyzal 5 Mg Tabs (Levocetirizine Dihydrochloride) .... Once Daily At Bedtime 6)  Nasonex 50 Mcg/act Susp (Mometasone Furoate) .... Uad At Bedtime 7)  Zetia 10 Mg Tabs (Ezetimibe) .... Pt Not Currently Taking  Allergies (verified): 1)  ! * Biaxin 2)  ! * Codeine 3)  ! * Sulfa 4)  ! * Vicodin 5)  ! *  Augmentin 6)  ! * Ceftin 7)  ! * Darvocet-N 100 8)  ! * Keflex 9)  ! * Levaquin 10)  ! * All Cillins  Past History:  Past Medical History: 1. Breast cancer s/p right mastectomy as well as chemotherapy and radiation.  She has chronic right arm lymphedema. 2. Colon polyps 3. GERD 4. PUD: Avoids aspirin due to history of ulcer.  5. Cardiac workup in past with negative myoview in 2009.  Given her risk factors, she ended up having a catheterization anyway which showed no angiographic coronary disease (5/09).   6. TAH (7/10) 7. Vertigo 8. Hyperlipidemia: Patient is taking Zetia.  She has been intolerant to Crestor and pravastatin (she thinks) in the past due to myalgias.    Family History: 6 uncles had MIs, as early as in their 92s.  Father with MI in his 29s.  Mother with MI in her 60s.  Brother with diabetes.   Social History: Tobacco Use - No.  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   Vital Signs:  Patient profile:   60 year old female Height:      62.25 inches Weight:      149 pounds BMI:  27.13 Pulse rate:   65 / minute Pulse rhythm:   regular BP sitting:   128 / 84  (left arm) Cuff size:   large  Vitals Entered By: Judithe Modest CMA (October 01, 2009 10:47 AM)  Physical Exam  General:  Well developed, well nourished, in no acute distress. Head:  normocephalic and atraumatic Nose:  no deformity, discharge, inflammation, or lesions Mouth:  Teeth, gums and palate normal. Oral mucosa normal. Neck:  Neck supple, no JVD. No masses, thyromegaly or abnormal cervical nodes. Lungs:  Clear bilaterally to auscultation and percussion. Heart:  Non-displaced PMI, chest non-tender; regular rate and rhythm, S1, S2 without murmurs, rubs or gallops. Carotid upstroke normal, no bruit.  Pedals normal pulses. No edema, no varicosities. Abdomen:  Bowel  sounds positive; abdomen soft and non-tender without masses, organomegaly, or hernias noted. No hepatosplenomegaly. Msk:  Back normal, normal gait. Muscle strength and tone normal. Extremities:  Right arm lymphedema Neurologic:  Alert and oriented x 3. Skin:  Intact without lesions or rashes. Psych:  Normal affect.   Impression & Recommendations:  Problem # 1:  DYSPNEA ON EXERTION (ICD-786.09) Patient reports new exertional dyspnea over the last 2 months.  She does not appear volume overloaded on exam.  No orthopnea or PND.  She does not have any chest pain.  She had a heart catheterization in 5/09 with no angiographic CAD.  I will start with an echocardiogram to assess LV systolic and diastolic function.  Will also get a BNP, CBC, and TSH.    Problem # 2:  CAROTID BRUIT (ICD-785.9) Patient hears a "whooshing" sound in her right ear.  Possibly hearing carotid turbulence.  Will get carotid dopplers.    Problem # 3:  HYPERLIPIDEMIA (ICD-272.4) Will get lipids/LFTs. Pitivastatin (Livalo) would be an option if needs more control as this statin is less likely to cause myalgias.   Other Orders: TLB-BMP (Basic Metabolic Panel-BMET) (80048-METABOL) TLB-BNP (B-Natriuretic Peptide) (83880-BNPR) TLB-CBC Platelet - w/Differential (85025-CBCD) TLB-Hepatic/Liver Function Pnl (80076-HEPATIC) TLB-Lipid Panel (80061-LIPID) TLB-TSH (Thyroid Stimulating Hormone) (84443-TSH) Echocardiogram (Echo) Carotid Duplex (Carotid Duplex)  Patient Instructions: 1)  Your physician has requested that you have a carotid duplex. This test is an ultrasound of the carotid arteries in your neck. It looks at blood flow through these arteries that supply the brain with blood. Allow one hour for this exam. There are no restrictions or special instructions. 2)  Your physician has requested that you have an echocardiogram.  Echocardiography is a painless test that uses sound waves to create images of your heart. It provides  your doctor with information about the size and shape of your heart and how well your heart's chambers and valves are working.  This procedure takes approximately one hour. There are no restrictions for this procedure. 3)  Your physician recommends that you have lab today: BMP/BNP/LIPID/LIVER/CBC/TSH 4)  Your physician recommends that you schedule a follow-up appointment with Dr Shirlee Latch  after testing is completed in about 2 weeks

## 2010-08-11 NOTE — Cardiovascular Report (Signed)
Summary: [facility] Cath Report  [facility] Cath Report   Imported By: Roderic Ovens 10/13/2009 16:52:32  _____________________________________________________________________  External Attachment:    Type:   Image     Comment:   External Document

## 2010-08-11 NOTE — Progress Notes (Signed)
Summary: sided effect from sample meds  Phone Note Call from Patient Call back at Home Phone [phone]   Caller: Patient Reason for Call: Talk to Nurse Complaint: Nausea/Vomiting/Diarrhea Summary of Call: pt on new meds livalo -statin drug. went to er last night. c/o  sided effect from meds. stomach pain.  Initial call taken by: Lorne Skeens,  October 23, 2009 9:53 AM  Follow-up for Phone Call        I spoke with the pt and she went to the ER last night for abdominal pain, loose stools and vomiting.  The pt spoke with her pharmacist and was told that the Livalo could be causing her symptoms.  The pt started this medication on 10/14/09.  The pt c/o dizziness, ear popping and nausea for the last 5 weeks.  Yesterday was the first time the pt had vomiting,  loose bowel movements and fluttering in her stomach.  The pt went to the ER for evaluation and her LABS and Abdominal US were within normal. I instructed the pt to hold Livalo for the next 7-10 days to see if her symptoms improved.  The pt will call the office after this time to let us know how she is feeling.  The pt agreed with plan. I will forward this information to Dr Shirlee Latch for his review.  Follow-up by: Julieta Gutting, RN, BSN,  October 23, 2009 10:18 AM

## 2010-08-11 NOTE — Letter (Signed)
Summary: [facility] Echo, EKG, Duplex 2008  [facility] Echo, EKG, Duplex 2008   Imported By: Roderic Ovens 10/20/2009 10:58:33  _____________________________________________________________________  External Attachment:    Type:   Image     Comment:   External Document

## 2010-08-11 NOTE — Assessment & Plan Note (Signed)
Summary: F/U @ 12:15 CAROTID AND ECHO DONE 10-10-09/SL   Primary Provider:  Dr. Nobie Putnam   History of Present Illness: 60 yo with history of hyperlipidemia and strong family history of CAD presents for cardiology followup.  Patient has had a cardiac workup in the past, with normal myoview in 2009 done because of atypical chest pain.  This was followed by a normal heart catheterization, also in 2009.  Recently, the patient has noted exertional shortness of breath.  This has been going on for about 3 months now.  She is short of breath after climbing a flight of steps or after walking less than 1 block.  She does not have any chest pain.  No orthopnea or PND. No congestion, coughing, wheezing, fever.  No history of asthma or smoking.  Echo showed moderate diastolic dysfunction with preserved LV systolic function, mild to moderate MR, mild to moderate TR.  BNP was not significantly elevated (60).    Patient also has been having episodes of vertigo.  This seems separate from the dyspnea.  She is seeing an ENT doctor soon.   As mentioned, multiple members of the patient's family have had premature CAD. She has hyperlipidemia but has been unable to tolerate Crestor or a second statin that she thinks was pravastatin due to muscle  pain.  LDL has been very high.   Labs (3/11): K 3.8, creatinine 0.9, BNP 60, HCT 40.8, HDL 64, LDL 220, TSH normal  Current Medications (verified): 1)  Effexor Xr 37.5 Mg Xr24h-Cap (Venlafaxine Hcl) .... Take One Capsule Once Daily 2)  Arimidex 1 Mg Tabs (Anastrozole) .... Take One Tablet Once Daily 3)  Protonix 40 Mg Tbec (Pantoprazole Sodium) .... As Needed For Reflux 4)  Clarinex 5 Mg Tabs (Desloratadine) .... Take One Tablet Once Daily 5)  Xyzal 5 Mg Tabs (Levocetirizine Dihydrochloride) .... Once Daily At Bedtime 6)  Nasonex 50 Mcg/act Susp (Mometasone Furoate) .... Uad At Bedtime 7)  Livalo 2 Mg Tabs (Pitavastatin Calcium) .... One Daily--Hasnt Started Yet  Allergies  (verified): 1)  ! * Biaxin 2)  ! * Codeine 3)  ! * Sulfa 4)  ! * Vicodin 5)  ! * Augmentin 6)  ! * Ceftin 7)  ! * Darvocet-N 100 8)  ! * Keflex 9)  ! * Levaquin 10)  ! * All Cillins 11)  ! * Pravastatin 12)  ! Crestor  Past History:  Past Medical History: 1. Breast cancer s/p right mastectomy as well as chemotherapy and radiation.  She has chronic right arm lymphedema. 2. Colon polyps 3. GERD 4. PUD: Avoids aspirin due to history of ulcer.  5. Cardiac workup in past with negative myoview in 2009.  Given her risk factors, she ended up having a catheterization anyway which showed no angiographic coronary disease and EF 60% (5/09).   6. TAH (7/10) 7. Vertigo 8. Hyperlipidemia: Patient is taking Zetia.  She has been intolerant to Crestor and pravastatin (she thinks) in the past due to myalgias.   9. Exertional dyspnea: echo (4/11) was suggestive of diastolic CHF, showing EF 55%, moderate (pseudonormal) diastolic dysfunction, mild-moderate MR, moderate TR, mild biatrial enlargement.  10.  Carotid US (3/11): no significant stenosis.   Family History: Reviewed history from 10/01/2009 and no changes required. 6 uncles had MIs, as early as in their 41s.  Father with MI in his 91s.  Mother with MI in her 62s.  Brother with diabetes.   Social History: Reviewed history from 10/01/2009 and no changes  required. Tobacco Use - No.  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   Vital Signs:  Patient profile:   60 year old female Height:      62.25 inches Weight:      147 pounds BMI:     26.77 Pulse rate:   87 / minute Resp:     18 per minute BP sitting:   112 / 66  (left arm)  Vitals Entered By: Marrion Coy, CNA (October 14, 2009 12:16 PM)  Physical Exam  General:  Well developed, well nourished, in no acute distress. Neck:  Neck supple, no JVD. No  masses, thyromegaly or abnormal cervical nodes. Lungs:  Clear bilaterally to auscultation and percussion. Heart:  Non-displaced PMI, chest non-tender; regular rate and rhythm, S1, S2 without murmurs, rubs or gallops. Carotid upstroke normal, no bruit.  Pedals normal pulses. No edema, no varicosities. Abdomen:  Bowel sounds positive; abdomen soft and non-tender without masses, organomegaly, or hernias noted. No hepatosplenomegaly. Extremities:  No clubbing or cyanosis. Neurologic:  Alert and oriented x 3. Psych:  Normal affect.   Impression & Recommendations:  Problem # 1:  DYSPNEA ON EXERTION (ICD-786.09) Patient continues to have significant exertional dyspnea which is new for her.  Echo showed probable moderate diastolic dysfunction, which certainly may be contributing.   - Start Lasix 20 mg daily with 20 mEq KCl to see if this helps symptomatically.   - Given ongoing significant exertional dyspnea, will set her up for Lexiscan myoview to rule out ischemia as a cause (? ischemic diastolic dysfunction).    Problem # 2:  CAROTID BRUIT (ICD-785.9) No significant carotid stenosis.  There is probably turbulent flow in a tortuous carotid causing the whooshing sound she hears.   Problem # 3:  HYPERLIPIDEMIA (ICD-272.4) LDL was extremely high in 8/29 (220).  I am going to start her on Livalo at 2 mg daily. Hopefully she will be able to avoid side effects your   Other Orders: Nuclear Stress Test (Nuc Stress Test)  Patient Instructions: 1)  Your physician has recommended you make the following change in your medication:  2)  Start Lasix(furosemide) 20mg  daily 3)  Start KCL(potassium) daily 4)  Your physician has requested that you have an lexiscan myoview.  For further information please visit [email or web address]  Please follow instruction sheet, as given. 5)  Your physician recommends that you return for lab work in: 2 weeks--BMP 786.09 6)  Your physician recommends that you schedule a  follow-up appointment in: 1 month with Dr Shirlee Latch. Prescriptions: POTASSIUM CHLORIDE CRYS CR 20 MEQ CR-TABS (POTASSIUM CHLORIDE CRYS CR) Take one tablet by mouth daily  #30 x 6   Entered by:   Katina Dung, RN, BSN   Authorized by:   Marca Ancona, MD   Signed by:   Katina Dung, RN, BSN on 10/14/2009   Method used:   Electronically to        [facility] (retail)       [address]       [place], [place]  [address]       Ph: [phone]       Fax: [phone]   RxID:   [id number] POTASSIUM CHLORIDE CR 10 MEQ CR-CAPS (POTASSIUM CHLORIDE) Take one tablet by mouth daily  #30  x 6   Entered by:   Katina Dung, RN, BSN   Authorized by:   Marca Ancona, MD   Signed by:   Katina Dung, RN, BSN on 10/14/2009   Method used:   Electronically to        [facility] (retail)       [address]       [place], [place]  [address]       Ph: [phone]       Fax: [phone]   RxID:   [id number] FUROSEMIDE 20 MG TABS (FUROSEMIDE) one tablet daily  #30 x 6   Entered by:   Katina Dung, RN, BSN   Authorized by:   Marca Ancona, MD   Signed by:   Katina Dung, RN, BSN on 10/14/2009   Method used:   Electronically to        [facility] (retail)       [address]       [place], [place]  [address]       Ph: [phone]       Fax: [phone]   RxID:   [id number]

## 2010-08-11 NOTE — Assessment & Plan Note (Signed)
Summary: Cardiology Nuclear Study  Nuclear Med Background Indications for Stress Test: Evaluation for Ischemia   History: Echo, Heart Catheterization, History of Chemo, Myocardial Perfusion Study  History Comments: '09 DVV:OHYWVP; 5/09 Cath:normal, EF=60%;  10/10/09 Echo:EF=55%, mild-moderate MR/TR  Symptoms: DOE, Palpitations, SOB    Nuclear Pre-Procedure Cardiac Risk Factors: Family History - CAD, Lipids Caffeine/Decaff Intake: none NPO After: 10:00 PM Lungs: clear IV 0.9% NS with Angio Cath: 20g     IV Site: (L) Forearm IV Started by: Stanton Kidney EMT-P Chest Size (in) 34     Cup Size C     Height (in): 62.25 Weight (lb): 146 BMI: 26.59 Tech Comments: This patient was very symptomatic about 5 minutes into recovery. She was very nauseated and had a very bad headache.  Nuclear Med Study 1 or 2 day study:  1 day     Stress Test Type:  Eugenie Birks Reading MD:  Charlton Haws, MD     Referring MD:  D.McLean Resting Radionuclide:  Technetium 9m Tetrofosmin     Resting Radionuclide Dose:  10.6 mCi  Stress Radionuclide:  Technetium 72m Tetrofosmin     Stress Radionuclide Dose:  33 mCi   Stress Protocol   Lexiscan: 0.4 mg   Stress Test Technologist:  Milana Na EMT-P     Nuclear Technologist:  Domenic Polite CNMT  Rest Procedure  Myocardial perfusion imaging was performed at rest 45 minutes following the intravenous administration of Myoview Technetium 24m Tetrofosmin.  Stress Procedure  The patient received IV Lexiscan 0.4 mg over 15-seconds.  Myoview injected at 30-seconds.  There were no significant changes with infusion.  Quantitative spect images were obtained after a 45 minute delay.  QPS Raw Data Images:  Normal; no motion artifact; normal heart/lung ratio. Stress Images:  NI: Uniform and normal uptake of tracer in all myocardial segments. Rest Images:  Normal homogeneous uptake in all areas of the myocardium. Subtraction (SDS):  Normal Transient Ischemic  Dilatation:  1  (Normal <1.22)  Lung/Heart Ratio:  .35  (Normal <0.45)  Quantitative Gated Spect Images QGS EDV:  58 ml QGS ESV:  17 ml QGS EF:  71 % QGS cine images:  normal  Findings Normal nuclear study      Overall Impression  Exercise Capacity: Lexiscan BP Response: Normal blood pressure response. Clinical Symptoms: Nausea ECG Impression: No significant ST segment change suggestive of ischemia. Overall Impression: Normal stress nuclear study.  Appended Document: Cardiology Nuclear Study normal  Appended Document: Cardiology Nuclear Study NA   Appended Document: Cardiology Nuclear Study NA  Appended Document: Cardiology Nuclear Study discussed results with pt by telphone   Clinical Lists Changes  Medications: Removed medication of LIVALO 2 MG TABS (PITAVASTATIN CALCIUM) one daily--hasnt started yet     pt states unable to take Livalo due to GI distress -

## 2010-08-11 NOTE — Assessment & Plan Note (Signed)
Summary: 1 month rov/sl   Primary Provider:  Dr. Nobie Putnam  CC:  f73m/pt now going to physical therapy.Marland Kitchen  History of Present Illness: 60 yo with history of hyperlipidemia and strong family history of CAD returns for cardiology followup.  Patient has had a cardiac workup in the past, with normal myoview in 2009 done because of atypical chest pain.  This was followed by a normal heart catheterization, also in 2009.  Recently, the patient has noted exertional shortness of breath.  This has been going on for about 3 months now.  She is short of breath after climbing a flight of steps or after walking less than 1 block.  She does not have any chest pain.  No orthopnea or PND. No congestion, coughing, wheezing, fever.  No history of asthma or smoking.  Echo showed moderate diastolic dysfunction with preserved LV systolic function, mild to moderate MR, mild to moderate TR.  BNP was not significantly elevated (60).  Given the moderate diastolic dysfunction on echo, I had her try Lasix 20 mg daily.  She is not sure if this is helping her exertional symptomas as she has not been very active recently (because of vertigo).  Given the new onset exertional dyspnea and her family history, we did a Lexiscan myoview (4/11) which showed no ischemia or infarction.   Patient has been having episodes of vertigo.  She saw an ENT doctor and is doing physical therapy to help with the vertigo.   As mentioned, multiple members of the patient's family have had premature CAD. She has hyperlipidemia but has been unable to tolerate Crestor or a second statin that she thinks was pravastatin due to muscle  pain.  LDL has been very high.  I started her on Livalo but she developed abdominal pain and vomiting while on this medication.  She stopped it, then restarted it, and the abdominal pain and vomiting returned.    Labs (3/11): K 3.8, creatinine 0.9, BNP 60, HCT 40.8, HDL 64, LDL 220, TSH normal Labs (4/74): K 3.9, creatinine  0.9  Current Medications (verified): 1)  Effexor Xr 37.5 Mg Xr24h-Cap (Venlafaxine Hcl) .... Take One Capsule Once Daily 2)  Arimidex 1 Mg Tabs (Anastrozole) .... Take One Tablet Once Daily 3)  Protonix 40 Mg Tbec (Pantoprazole Sodium) .... As Needed For Reflux 4)  Clarinex 5 Mg Tabs (Desloratadine) .... Take One Tablet Once Daily 5)  Xyzal 5 Mg Tabs (Levocetirizine Dihydrochloride) .... Once Daily At Bedtime 6)  Nasonex 50 Mcg/act Susp (Mometasone Furoate) .... Uad At Bedtime 7)  Furosemide 20 Mg Tabs (Furosemide) .... One Tablet Daily 8)  Potassium Chloride Crys Cr 20 Meq Cr-Tabs (Potassium Chloride Crys Cr) .... Take One Tablet By Mouth Daily  Allergies (verified): 1)  ! * Biaxin 2)  ! * Codeine 3)  ! * Sulfa 4)  ! * Vicodin 5)  ! * Augmentin 6)  ! * Ceftin 7)  ! * Darvocet-N 100 8)  ! * Keflex 9)  ! * Levaquin 10)  ! * All Cillins 11)  ! * Pravastatin 12)  ! Crestor  Past History:  Past Medical History: 1. Breast cancer s/p right mastectomy as well as chemotherapy and radiation.  She has chronic right arm lymphedema. 2. Colon polyps 3. GERD 4. PUD: Avoids aspirin due to history of ulcer.  5. Cardiac workup in past with negative myoview in 2009.  Given her risk factors, she ended up having a catheterization anyway which showed no angiographic coronary disease  and EF 60% (5/09).   6. TAH (7/10) 7. Vertigo 8. Hyperlipidemia: She has been intolerant to Crestor and pravastatin (she thinks) in the past due to myalgias.  Livalo caused abdominal pain and vomiting.  9. Exertional dyspnea: echo (4/11) was suggestive of diastolic CHF, showing EF 55%, moderate (pseudonormal) diastolic dysfunction, mild-moderate MR, moderate TR, mild biatrial enlargement.  Lexiscan myoview was a normal study with EF 71% and no evidence for ischemia or infarction.  10.  Carotid US (3/11): no significant stenosis.   Family History: Reviewed history from 10/01/2009 and no changes required. 6 uncles had  MIs, as early as in their 64s.  Father with MI in his 51s.  Mother with MI in her 28s.  Brother with diabetes.   Social History: Reviewed history from 10/01/2009 and no changes required. Tobacco Use - No.  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   Vital Signs:  Patient profile:   60 year old female Height:      62.25 inches Weight:      147 pounds BMI:     26.77 Pulse rate:   84 / minute Pulse rhythm:   regular BP sitting:   116 / 76  (left arm) Cuff size:   regular  Vitals Entered By: Judithe Modest CMA (Nov 13, 2009 11:28 AM)  Physical Exam  General:  Well developed, well nourished, in no acute distress. Neck:  Neck supple, no JVD. No masses, thyromegaly or abnormal cervical nodes. Lungs:  Clear bilaterally to auscultation and percussion. Heart:  Non-displaced PMI, chest non-tender; regular rate and rhythm, S1, S2 without murmurs, rubs or gallops. Carotid upstroke normal, no bruit.  Pedals normal pulses. No edema, no varicosities. Abdomen:  Bowel sounds positive; abdomen soft and non-tender without masses, organomegaly, or hernias noted. No hepatosplenomegaly. Extremities:  No clubbing or cyanosis. Neurologic:  Alert and oriented x 3. Psych:  Normal affect.   Impression & Recommendations:  Problem # 1:  DYSPNEA ON EXERTION (ICD-786.09) Patient does have moderate diastolic dysfunction on echo.  Lexiscan myoview showed no ischemia or infarction.  I have started her on Lasix.  She is not sure how much it has helped because she has not been very acitve (due to vertigo). Continue current Lasix and KCl for now.   Problem # 2:  HYPERLIPIDEMIA (ICD-272.4) Very high LDL.  Unable to tolerate Crestor, pravastatin, or Livalo.  She does not want to try Niaspan due to concern about side effects.  I will have her start Zetia 10 mg daily.   Patient  Instructions: 1)  Your physician has recommended you make the following change in your medication:  2)  Start Zetia 10mg  daily 3)  Your physician recommends that you schedule a follow-up appointment in: 6 months with Dr Shirlee Latch.   4)  Your physician recommends that you return for a FASTING lipid profile/liver profile a few days before your 6 month appointment with Dr Shirlee Latch.  786.09 Prescriptions: ZETIA 10 MG TABS (EZETIMIBE) one tablet daily  #30 x 6   Entered by:   Katina Dung, RN, BSN   Authorized by:   Marca Ancona, MD   Signed by:   Katina Dung, RN, BSN on 11/13/2009   Method used:   Electronically to        [facility] (retail)  [address]       [place], [place]  [address]       Ph: [phone]       Fax: [phone]   RxID:   [id number]

## 2010-08-11 NOTE — Letter (Signed)
Summary: [facility] Office Note  [facility] Office Note   Imported By: Roderic Ovens 10/20/2009 11:43:47  _____________________________________________________________________  External Attachment:    Type:   Image     Comment:   External Document

## 2010-08-13 ENCOUNTER — Encounter (HOSPITAL_COMMUNITY)
Admission: RE | Admit: 2010-08-13 | Discharge: 2010-08-13 | Disposition: A | Discharge status: Home / Self Care | Payer: 59 | Source: Ambulatory Visit | Attending: Orthopaedic Surgery | Admitting: Orthopaedic Surgery

## 2010-08-13 DIAGNOSIS — IMO0001 Reserved for inherently not codable concepts without codable children: Secondary | ICD-10-CM | POA: Insufficient documentation

## 2010-08-13 DIAGNOSIS — M542 Cervicalgia: Secondary | ICD-10-CM | POA: Insufficient documentation

## 2010-08-13 DIAGNOSIS — M6281 Muscle weakness (generalized): Secondary | ICD-10-CM | POA: Insufficient documentation

## 2010-08-13 NOTE — Progress Notes (Signed)
Summary: refill sent to wrong pharmacy and pt req refills  Phone Note Refill Request Message from:  Patient on [facility]  Refills Requested: Medication #1:  ZETIA 10 MG TABS one tablet daily. rx was to be called into [facility]-pt also requesting samples to last until can get mail order   Method Requested: Telephone to Pharmacy Initial call taken by: Glynda Jaeger,  July 20, 2010 11:33 AM    Prescriptions: ZETIA 10 MG TABS (EZETIMIBE) one tablet daily  #90 x 3   Entered by:   Caralee Ates CMA   Authorized by:   Marca Ancona, MD   Signed by:   Caralee Ates CMA on 07/20/2010   Method used:   Faxed to ...       [facility] (mail-order)       [address] [place], [place]  [address]       Ph: [phone]       Fax: [phone]   RxID:   [id number]

## 2010-08-13 NOTE — Progress Notes (Signed)
Summary: refill-ZETIA  Phone Note Refill Request Message from:  Patient on July 17, 2010 11:24 AM  Refills Requested: Medication #1:  ZETIA 10 MG TABS one tablet daily. [facility] [phone] with 90 day supply with refills  Initial call taken by: Judie Grieve,  July 17, 2010 11:25 AM    Prescriptions: ZETIA 10 MG TABS (EZETIMIBE) one tablet daily  #30 x 6   Entered by:   Caralee Ates CMA   Authorized by:   Marca Ancona, MD   Signed by:   Caralee Ates CMA on 07/17/2010   Method used:   Electronically to        [facility] (retail)       [address]       [place], [place]  [address]       Ph: [phone]       Fax: [phone]   RxID:   [id number]

## 2010-08-13 NOTE — Progress Notes (Signed)
Summary: re samples of zetia  Phone Note Call from Patient   Caller: Patient [phone] Reason for Call: Talk to Nurse Summary of Call: pt calling req samples of zetia/.Marland KitchenMarland KitchenMarland KitchenBurnett Kanaris checked and we do not have any samples of medication but pt would like a call to be advised what to do cause she only has one pill left Initial call taken by: Glynda Jaeger,  July 16, 2010 9:26 AM  Follow-up for Phone Call        I talked with pt--she is going to check with [facility]-this is her current Rx mail order company to see how much a 90 day supply of Zetia would be --she will call me back to let me know if she wants 90 day supply to [facility] or 30 day supply locally to [facility]

## 2010-08-18 ENCOUNTER — Ambulatory Visit (HOSPITAL_COMMUNITY)
Admission: RE | Admit: 2010-08-18 | Discharge: 2010-08-18 | Disposition: A | Discharge status: Home / Self Care | Payer: 59 | Source: Ambulatory Visit | Attending: Orthopaedic Surgery | Admitting: Orthopaedic Surgery

## 2010-08-18 DIAGNOSIS — M6281 Muscle weakness (generalized): Secondary | ICD-10-CM | POA: Insufficient documentation

## 2010-08-18 DIAGNOSIS — M542 Cervicalgia: Secondary | ICD-10-CM | POA: Insufficient documentation

## 2010-08-18 DIAGNOSIS — IMO0001 Reserved for inherently not codable concepts without codable children: Secondary | ICD-10-CM | POA: Insufficient documentation

## 2010-08-20 ENCOUNTER — Ambulatory Visit (HOSPITAL_COMMUNITY)
Admission: RE | Admit: 2010-08-20 | Discharge: 2010-08-20 | Disposition: A | Discharge status: Home / Self Care | Payer: 59 | Source: Ambulatory Visit | Attending: Orthopaedic Surgery | Admitting: Orthopaedic Surgery

## 2010-08-20 DIAGNOSIS — IMO0001 Reserved for inherently not codable concepts without codable children: Secondary | ICD-10-CM | POA: Insufficient documentation

## 2010-08-20 DIAGNOSIS — M542 Cervicalgia: Secondary | ICD-10-CM | POA: Insufficient documentation

## 2010-08-20 DIAGNOSIS — M6281 Muscle weakness (generalized): Secondary | ICD-10-CM | POA: Insufficient documentation

## 2010-08-25 ENCOUNTER — Ambulatory Visit (HOSPITAL_COMMUNITY): Payer: 59 | Admitting: Physical Therapy

## 2010-08-27 ENCOUNTER — Ambulatory Visit (HOSPITAL_COMMUNITY)
Admission: RE | Admit: 2010-08-27 | Discharge: 2010-08-27 | Disposition: A | Discharge status: Home / Self Care | Payer: 59 | Source: Ambulatory Visit | Attending: Orthopaedic Surgery | Admitting: Orthopaedic Surgery

## 2010-08-27 DIAGNOSIS — IMO0001 Reserved for inherently not codable concepts without codable children: Secondary | ICD-10-CM | POA: Insufficient documentation

## 2010-08-27 DIAGNOSIS — M6281 Muscle weakness (generalized): Secondary | ICD-10-CM | POA: Insufficient documentation

## 2010-08-27 DIAGNOSIS — M542 Cervicalgia: Secondary | ICD-10-CM | POA: Insufficient documentation

## 2010-09-01 ENCOUNTER — Ambulatory Visit (HOSPITAL_COMMUNITY)
Admission: RE | Admit: 2010-09-01 | Discharge: 2010-09-01 | Disposition: A | Discharge status: Home / Self Care | Payer: 59 | Source: Ambulatory Visit

## 2010-09-03 ENCOUNTER — Ambulatory Visit (HOSPITAL_COMMUNITY): Payer: 59

## 2010-09-21 LAB — POCT URINALYSIS DIPSTICK
Hgb urine dipstick: NEGATIVE
Protein, ur: NEGATIVE mg/dL
Specific Gravity, Urine: 1.025 (ref 1.005–1.030)
Urobilinogen, UA: 0.2 mg/dL (ref 0.0–1.0)
pH: 6 (ref 5.0–8.0)

## 2010-09-21 LAB — GC/CHLAMYDIA PROBE AMP, GENITAL
Chlamydia, DNA Probe: NEGATIVE
GC Probe Amp, Genital: NEGATIVE

## 2010-09-28 LAB — POCT URINALYSIS DIP (DEVICE)
Bilirubin Urine: NEGATIVE
Glucose, UA: NEGATIVE mg/dL
Nitrite: NEGATIVE

## 2010-09-30 LAB — DIFFERENTIAL
Eosinophils Absolute: 0.3 10*3/uL (ref 0.0–0.7)
Eosinophils Relative: 4 % (ref 0–5)
Lymphs Abs: 2.2 10*3/uL (ref 0.7–4.0)
Monocytes Absolute: 0.5 10*3/uL (ref 0.1–1.0)

## 2010-09-30 LAB — CBC
MCHC: 35 g/dL (ref 30.0–36.0)
Platelets: 275 10*3/uL (ref 150–400)
RBC: 4.67 MIL/uL (ref 3.87–5.11)
WBC: 7.4 10*3/uL (ref 4.0–10.5)

## 2010-09-30 LAB — URINALYSIS, ROUTINE W REFLEX MICROSCOPIC
Bilirubin Urine: NEGATIVE
Glucose, UA: NEGATIVE mg/dL
Hgb urine dipstick: NEGATIVE
Ketones, ur: NEGATIVE mg/dL
Specific Gravity, Urine: 1.014 (ref 1.005–1.030)
pH: 8.5 — ABNORMAL HIGH (ref 5.0–8.0)

## 2010-09-30 LAB — COMPREHENSIVE METABOLIC PANEL
ALT: 24 U/L (ref 0–35)
AST: 25 U/L (ref 0–37)
Albumin: 4 g/dL (ref 3.5–5.2)
CO2: 30 mEq/L (ref 19–32)
Calcium: 9.8 mg/dL (ref 8.4–10.5)
Chloride: 102 mEq/L (ref 96–112)
GFR calc Af Amer: >60 mL/min (ref >60)
GFR calc non Af Amer: 57 mL/min — ABNORMAL LOW (ref >60)
Sodium: 139 mEq/L (ref 135–145)

## 2010-09-30 LAB — LIPASE, BLOOD: Lipase: 21 U/L (ref 11–59)

## 2010-10-15 LAB — CBC
HCT: 31.9 % — ABNORMAL LOW (ref 36.0–46.0)
HCT: 33.9 % — ABNORMAL LOW (ref 36.0–46.0)
HCT: 34.3 % — ABNORMAL LOW (ref 36.0–46.0)
Hemoglobin: 11.7 g/dL — ABNORMAL LOW (ref 12.0–15.0)
MCV: 86.9 fL (ref 78.0–100.0)
MCV: 90.1 fL (ref 78.0–100.0)
Platelets: 252 10*3/uL (ref 150–400)
RBC: 3.81 MIL/uL — ABNORMAL LOW (ref 3.87–5.11)
RBC: 3.94 MIL/uL (ref 3.87–5.11)
RDW: 13.6 % (ref 11.5–15.5)
RDW: 14.4 % (ref 11.5–15.5)
WBC: 10.6 10*3/uL — ABNORMAL HIGH (ref 4.0–10.5)
WBC: 12.1 10*3/uL — ABNORMAL HIGH (ref 4.0–10.5)
WBC: 9.2 10*3/uL (ref 4.0–10.5)

## 2010-10-15 LAB — DIFFERENTIAL
Basophils Absolute: 0 10*3/uL (ref 0.0–0.1)
Basophils Relative: 0 % (ref 0–1)
Eosinophils Relative: 2 % (ref 0–5)
Eosinophils Relative: 3 % (ref 0–5)
Lymphocytes Relative: 16 % (ref 12–46)
Lymphs Abs: 1.9 10*3/uL (ref 0.7–4.0)
Monocytes Absolute: 0.1 10*3/uL (ref 0.1–1.0)
Monocytes Absolute: 0.7 10*3/uL (ref 0.1–1.0)
Neutro Abs: 7.8 10*3/uL — ABNORMAL HIGH (ref 1.7–7.7)

## 2010-10-15 LAB — CULTURE, BLOOD (ROUTINE X 2)
Culture: NO GROWTH
Culture: NO GROWTH

## 2010-10-15 LAB — BASIC METABOLIC PANEL
Chloride: 102 mEq/L (ref 96–112)
Creatinine, Ser: 1.25 mg/dL — ABNORMAL HIGH (ref 0.4–1.2)
GFR calc Af Amer: 53 mL/min — ABNORMAL LOW (ref >60)
GFR calc non Af Amer: 44 mL/min — ABNORMAL LOW (ref >60)
GFR calc non Af Amer: 47 mL/min — ABNORMAL LOW (ref >60)
Potassium: 3.6 mEq/L (ref 3.5–5.1)
Potassium: 4.5 mEq/L (ref 3.5–5.1)
Sodium: 142 mEq/L (ref 135–145)

## 2010-10-21 LAB — CBC
HCT: 33.3 % — ABNORMAL LOW (ref 36.0–46.0)
HCT: 33.4 % — ABNORMAL LOW (ref 36.0–46.0)
HCT: 46.5 % — ABNORMAL HIGH (ref 36.0–46.0)
Hemoglobin: 11.2 g/dL — ABNORMAL LOW (ref 12.0–15.0)
Hemoglobin: 11.4 g/dL — ABNORMAL LOW (ref 12.0–15.0)
MCHC: 33.6 g/dL (ref 30.0–36.0)
MCV: 88.8 fL (ref 78.0–100.0)
MCV: 89 fL (ref 78.0–100.0)
Platelets: 185 10*3/uL (ref 150–400)
Platelets: 224 10*3/uL (ref 150–400)
RBC: 3.75 MIL/uL — ABNORMAL LOW (ref 3.87–5.11)
RDW: 13 % (ref 11.5–15.5)
RDW: 14.2 % (ref 11.5–15.5)
WBC: 9.3 10*3/uL (ref 4.0–10.5)

## 2010-10-21 LAB — CULTURE, BLOOD (ROUTINE X 2)

## 2010-10-21 LAB — DIFFERENTIAL
Basophils Absolute: 0.3 10*3/uL — ABNORMAL HIGH (ref 0.0–0.1)
Basophils Relative: 2 % — ABNORMAL HIGH (ref 0–1)
Eosinophils Relative: 0 % (ref 0–5)
Lymphocytes Relative: 8 % — ABNORMAL LOW (ref 12–46)

## 2010-10-21 LAB — BASIC METABOLIC PANEL
CO2: 25 mEq/L (ref 19–32)
Chloride: 108 mEq/L (ref 96–112)
GFR calc Af Amer: >60 mL/min (ref >60)
Potassium: 3.7 mEq/L (ref 3.5–5.1)
Sodium: 135 mEq/L (ref 135–145)

## 2010-10-21 LAB — POCT I-STAT, CHEM 8
BUN: 13 mg/dL (ref 6–23)
HCT: 49 % — ABNORMAL HIGH (ref 36.0–46.0)
Hemoglobin: 16.7 g/dL — ABNORMAL HIGH (ref 12.0–15.0)
Sodium: 138 mEq/L (ref 135–145)
TCO2: 28 mmol/L (ref 0–100)

## 2010-11-24 NOTE — Discharge Summary (Signed)
Kylie Tucker, Kylie Tucker              ACCOUNT NO.:  [id number]   MEDICAL RECORD NO.:  [id number]          PATIENT TYPE:  INP   LOCATION:  1305                         FACILITY:  [facility]   PHYSICIAN:  Eduard Clos, MDDATE OF BIRTH:  22-Dec-1950   DATE OF ADMISSION:  11/05/2008  DATE OF DISCHARGE:                               DISCHARGE SUMMARY   PRIMARY CARE PHYSICIAN:  Patrica Duel, M.D.   HOSPITAL COURSE:  A 60 year old female with known history of breast  cancer in 2004, status post mastectomy with radiation therapy and  chemotherapy who works as a Location manager, was having increasing  swelling and erythema with a temperature of 102, was admitted for right  upper extremity cellulitis.  The patient was started on vancomycin and  ciprofloxacin with cultures obtained that are negative at this time.  The patient's temperature has subsided.  She has already received 3 days  of IV antibiotics.  On admission the patient had a white blood cell  count of 10.6 which has decreased to 8.3.  The patient's erythema has  completely resolved.  The pain has subsided.  We did do a Doppler of the  upper extremity to rule out deep venous thrombosis which was also  negative.  At this time, as the patient's condition has largely  resolved, we will be sending the patient home on p.o. doxycycline for 10  more days and an outpatient referral for physical therapy for chronic  lymphedema.  At the time of this dictation, the patient is  hemodynamically stable.   PROCEDURES DONE DURING THIS STAY:  Doppler of the upper extremity which  was negative for deep venous thrombosis.   PERTINENT LABORATORY DATA:  Cultures to date are negative.   FINAL DIAGNOSES:  1. Right upper extremity cellulitis.  2. History of chronic lymphedema of the right upper arm.  3. History of carcinoma of breast, status post mastectomy, radiation      and chemotherapy.  4. Chronic allergies.  5. Migraines.  6.  Hyperlipidemia.   DISCHARGE MEDICATIONS:  1. Doxycycline 100 mg p.o. b.i.d. for 10 days.  2. Arimidex 1 mg p.o. daily.  3. Effexor 37.5 mg p.o. b.i.d.  4. Nasacort 1 spray each nostril twice daily.  5. Xyzal 10 mg nightly.  6. Clarinex 5 mg p.o. daily.  7. Relpax 40 mg p.r.n. for headache.  8. Zometa yearly.   PLAN:  The patient is advised to follow up with her primary care  physician within a weeks time on Nov 11, 2008.  The patient is to have a  regular diet and to follow with physical therapy for her chronic  lymphedema.  In the event of any recurrent symptoms, the patient is  advised to go to the nearest emergency room.      Eduard Clos, MD  Electronically Signed     ANK/MEDQ  D:  11/08/2008  T:  11/08/2008  Job:  [id number]   cc:   Patrica Duel, M.D.  Fax: [phone]

## 2010-11-24 NOTE — H&P (Signed)
NAMELAYKIN, Kylie NO.:  [id number]   MEDICAL RECORD NO.:  [id number]          PATIENT TYPE:  INP   LOCATION:  1305                         FACILITY:  [facility]   PHYSICIAN:  Vania Rea, M.D. DATE OF BIRTH:  1950/07/21   DATE OF ADMISSION:  11/06/2008  DATE OF DISCHARGE:                              HISTORY & PHYSICAL   PRIMARY CARE PHYSICIAN:  Kylie Tucker in [place].   CHIEF COMPLAINT:  Pain and swelling of the right arm since yesterday.   HISTORY OF PRESENT ILLNESS:  This is a 60 year old Caucasian lady with a  history of breast cancer in 2004, status post mastectomy, radiation  therapy and chemotherapy, who works as a Location manager and as for the  past 2 days has not been feeling well.  She has been having chills and  yesterday noted a temperature of 102, while at work and later in the  evening note noticed pain, swelling and redness of her right upper  extremity.  The patient called her oncologist and Dr. Truett Perna, who was  on-call for Dr. Arbutus Ped advised her to come to the emergency room at  [facility] for evaluation.  The patient was evaluated in the emergency  room and the hospitalist service called to assist with management.  She  denies any chest pains or shortness of breath.  She denies any history  of trauma to the right upper extremity.  She has previously had  cellulitis in the right arm, last episode about 4-5 months ago.   PAST MEDICAL HISTORY:  1. Cancer of the right breast, status post mastectomy, radiation      therapy and chemotherapy.  Radiation therapy completed in February      2005.  2. Lymphedema of the right arm.  3. Hyperlipidemia.  4. Chronic allergies.  5. Migraines.   MEDICATIONS:  1. Arimidex 1 mg daily.  2. Effexor 37.5 mg twice daily.  3. Nasacort p.r.n.  4. Xyzal 10 mg nightly.  5. Clarinex 5 mg daily.  6. Relpax 40 mg p.r.n., last use 2 days ago.  7. Zometa yearly.   ALLERGIES:  1. AUGMENTIN.  2.  BIAXIN.  3. CEFTIN.  4. CODEINE.  5. KEFLEX.  6. SULFA.  7. DARVOCET-N.  8. VICODIN.   SOCIAL HISTORY:  She denies tobacco, alcohol or illicit drug use.  She  lives with her husband.  Her children are grown and live away.  She  works as a Location manager in a [employer].   FAMILY HISTORY:  Both parents are deceased, her father with an acute MI  and mother had hypertension.   REVIEW OF SYSTEMS:  Other than noted above, a 10-point review of systems  is unremarkable.   PHYSICAL EXAMINATION:  GENERAL:  A pleasant middle-aged Caucasian lady  reclining in the stretcher complaining of thirst VITAL SIGNS:  Temperature is 100.0, pulse 106, respiration 18, blood pressure 115/56.  She is saturating at 95% on room air.  HEENT:  Pupils are round and equal.  Mucous membranes pink and  anicteric.  She is moderately dehydrated.  NECK:  No cervical lymphadenopathy  or thyromegaly.  No jugulovenous  distention.  CHEST:  Clear to auscultation bilaterally.  She is status post right  mastectomy.  CARDIOVASCULAR:  Regular rhythm.  No murmur.  ABDOMEN:  Obese, soft and nontender.  No masses.  EXTREMITIES:  In the right upper extremity she has extensive brawny  edema.  It is erythematous and tender to touch, however, there is  sparing of the hand.  Lower extremity is without edema.  She has 2+  dorsalis pedis pulses bilaterally.  CENTRAL NERVOUS SYSTEM:  Cranial nerves II-XII are grossly intact and  she has no focal neurologic deficit.   LABORATORY DATA:  Her white count is 10.6, hemoglobin 16.2, platelets  224.  She has 87% neutrophils and her absolute granulocyte count is 9.2.  Her serum chemistry is reviewed and is essentially normal.  There is no  abnormality obvious.  BUN is 13 and creatinine is 1.1.   ASSESSMENT:  1. Cellulitis of the right upper extremity.  2. History of right mastectomy for carcinoma of the breast with      residual right upper extremity lymphedema.  3. History of  migraines.   PLAN:  We will admit this lady for treatments with intravenous  vancomycin and Avelox pending any definitive results from blood  cultures.  We will go ahead and get blood cultures, although the patient  has already received a dose of vancomycin.  We will recommend elevation  of the right upper extremity as much as possible.  Courtesy consult with  Dr. Truett Perna.  Other plans as per orders.      Vania Rea, M.D.  Electronically Signed     LC/MEDQ  D:  11/06/2008  T:  11/06/2008  Job:  [id number]   cc:   Lajuana Matte, MD  Fax: [phone]   Leighton Roach. Truett Perna, M.D.  Fax: [phone]   Patrica Duel, M.D.  Fax: [phone]

## 2010-11-27 NOTE — Op Note (Signed)
Kylie Tucker, Kylie Tucker                         ACCOUNT NO.:  [id number]   MEDICAL RECORD NO.:  [id number]                   PATIENT TYPE:  AMB   LOCATION:  DSC                                  FACILITY:  [facility]   PHYSICIAN:  Sandria Bales. Ezzard Standing, M.D.               DATE OF BIRTH:  Feb 03, 1951   DATE OF PROCEDURE:  09/17/2002  DATE OF DISCHARGE:                                 OPERATIVE REPORT   PREOPERATIVE DIAGNOSIS:  Infiltrating ductal carcinoma of the right breast.   POSTOPERATIVE DIAGNOSIS:  Infiltrating ductal carcinoma of the right breast  with positive sentinel lymph node on touch prep.   PROCEDURES:  1. Right breast partial mastectomy with the closest margins being deep.  2. Right sentinel lymph node biopsy with injection of isosulfan blue.  3. Right axillary lymph node dissection.   SURGEON:  Sandria Bales. Ezzard Standing, M.D.   ANESTHESIA:  General endotracheal.   ESTIMATED BLOOD LOSS:  100 mL.   DRAINS:  A 16 Jackson-Pratt drain was left in place at the end of the  procedure.   CLINICAL NOTE:  The patient is a 60 year old white female who was found to  have a mass in her right breast at about the 9 o'clock position.  She went  to the [facility], where she underwent a core biopsy by Rolan Bucco L. Kearney Hard,  M.D.  This showed an invasive mammary carcinoma.   Discussion carried out with the patient of the options of treatment,  including mastectomy with and without reconstruction versus lumpectomy.  The  patient was felt to be a good candidate for a lumpectomy and sentinel lymph  node biopsy.  Discussion carried out over the possibilities of needing to do  a full axillary node dissection, the potential risks, including but not  limited to bleeding, infection, seroma, and lymphedema.   DESCRIPTION OF PROCEDURE:  The patient presents to the operating room.  She  had her nipple injected with the radiocolloid in nuclear medicine.  She was  given a general endotracheal anesthetic.  Her right  breast and axilla were  prepped with Betadine solution and sterilely draped.   First I injected her subareolar space with about 0.75 mL of isosulfan blue.  I then used an ultrasound probe to confirm the palpable mass at the 9  o'clock position approximately 2 cm off the edge of the areola.  I then  prepped her right breast, sterilely draped it.   I first went for the axillary lymph node, which was found along behind the  pectoralis major muscle in the anterior aspect of the axilla.  We cut down  directly over a blue node, which was probably about 1.6 or 1.7 cm in  diameter.  It had counts of 650, the background was 15.  I sent this block  of tissue over to Dr. Debby Bud.  I marked this lymph node with a suture.  On  the single lymph node that Dr. Debby Bud identified, the touch prep was  positive, so she was felt to have a positive axillary lymph node.  There  were at least two other lymph nodes in the specimen which he was going to  send through for permanents.   I went on and did a right excisional breast biopsy, which was basically a  right partial mastectomy.  I went 3 or 4 cm medial to the mass kind of going  under the areola.  I went down to the chest wall.  I tried to get at least 2  cm around the entire mass area and sent it to pathology for them to evaluate  it.  I put a long suture in the medial aspect, a short suture cephalad.  Dr.  Debby Bud said the closest margin was the deep margin, which actually down to  the chest wall was only 3 mm.   I then turned my attention back to the axillary area, where I had left a  sponge.  I then excised the remainder of the axillary tissue, going well  into the pectoralis minor muscle.  I then swept the axillary contents down,  identifying the axillary vein.  There were two tributaries of the axillary  vein coming off inferiorly.  I also identified the long thoracic nerve of  Bell and the thoracodorsal nerves, and both were felt to be spared during   the dissection.   The axillary contents were swept down and sent to pathology.  Hemostasis was  controlled with clips, Vicryl ligatures.  The wound was irrigated.  A  Jackson-Pratt drain which was fully perforated was brought out through an  anterior inferior stab wound.  The skin was closed with subcutaneous 3-0  Vicryl sutures and skin staples, and the drain was sewn in place with a 2-0  nylon.   The patient tolerated the procedure well, was transported to the recovery  room in good condition.  Sponge and needle count were correct at the end of  the case.                                               Sandria Bales. Ezzard Standing, M.D.    DHN/MEDQ  D:  09/17/2002  T:  09/18/2002  Job:  [id number]   cc:   L. Lupe Carney, M.D.  [address] [place]  [place] [address]  Fax: [phone]   Edwena Felty. Romine, M.D.  [address]  [place]  [place] [address]  Fax: [phone]   Trude Mcburney. [place], M.D.  [address]  [place]  [place]  [address]  Fax: [phone]

## 2010-11-27 NOTE — Op Note (Signed)
NAMEGUNEET, Kylie Tucker                         ACCOUNT NO.:  [id number]   MEDICAL RECORD NO.:  [id number]                   PATIENT TYPE:  AMB   LOCATION:  DSC                                  FACILITY:  [facility]   PHYSICIAN:  Sandria Bales. Ezzard Standing, M.D.               DATE OF BIRTH:  [birth year]   DATE OF PROCEDURE:  10/29/2003  DATE OF DISCHARGE:                                 OPERATIVE REPORT   CCS #[id number]   PREOPERATIVE DIAGNOSIS:  Status post chemotherapy, planned removal of Port-A-  Cath.   POSTOPERATIVE DIAGNOSIS:  Status post chemotherapy for  right breast cancer.  Planned Port-A-Cath removal.   OPERATION PERFORMED:  Removal of Port-A-Cath.   SURGEON:  Sandria Bales. Ezzard Standing, M.D.   ANESTHESIA:  10 mm of 1% Xylocaine.   COMPLICATIONS:  None.   INDICATIONS FOR PROCEDURE:  Ms. [name] is a 60 year old white female who has  been in treatment for right breast carcinoma.  She has completed her  chemotherapy  and now comes for removal of her Port-A-Cath.   DESCRIPTION OF PROCEDURE:  The patient was placed in supine position.  Her  left upper chest was prepped with Betadine solution and sterilely draped.  I  infiltrated around the Port-A-Cath and removed the Port-A-Cath and its  reservoir and tubing without difficulty closing the incision with  interrupted and running 5-0 Vicryl suture.  The patient wanted to take the  Port-A-Cath home with her so this was cleaned and given to her.  She will  see me back in two weeks for follow-up.                                               Sandria Bales. Ezzard Standing, M.D.    DHN/MEDQ  D:  10/29/2003  T:  10/30/2003  Job:  [id number]   cc:   Valentino Hue. Magrinat, M.D.  [address] [facility]  [place]  [place] [address]  Fax: [phone]   Elmer Sow. Dorna Bloom, M.D.  [address] - [facility]  [place]  [place] [address]  Fax: [phone]   L. Lupe Carney, M.D.  [address] [place]  [place] [address]  Fax: [phone]

## 2010-11-27 NOTE — Op Note (Signed)
[place]  Patient:    Kylie Tucker, Kylie Tucker                      MRN: [id number] Proc. Date: 04/14/00 Adm. Date:  [id number] Attending:  Andre Lefort CC:         Lupe Carney, MD  Amedeo Kinsman, M.D.  Stephani Police, M.D.   Operative Report  DATE OF BIRTH:  1951/06/22  PREOPERATIVE DIAGNOSIS:  Cyst of left breast with irregular wall.  POSTOPERATIVE DIAGNOSIS:  Cyst of left breast with irregular wall, final pathology pending.  PROCEDURE:  Left breast biopsy.  SURGEON:  Sandria Bales. Ezzard Standing, M.D.  ASSISTANT:  None.  ANESTHESIA:  Approximately 15 cc of 1% Xylocaine with MAC anesthesia.  COMPLICATIONS:  None.  INDICATIONS:  Kylie Tucker is a 60 year old white female, who has a mass in her left breast at the 12 oclock position, which by ultrasound shows an irregular posterior wall.  She now comes for excision of this mass.  DESCRIPTION OF PROCEDURE:  I marked the patients breast before anesthesia and then prepped her breast with Betadine solution and sterilely draped.  This mass is right at the 12 oclock position of the left breast, approximately 2-3 cm off the edge of the areolar.  I made a direct incision over this mass, excised this cystic mass completely without getting into the cyst.  The block of breast tissue I excised was about 3 x 3 cm.  The wound was then irrigated.  Hemostasis was controlled with Bovie electrocautery and 3-0 Vicryl sutures.  The subcutaneous tissues were closed with 3-0 Vicryl suture.  The skin closed with a 5-0 Vicryl suture, painted with tincture of benzoin and Steri-Strips and sterilely dressed.  The patient tolerated the procedure well, was transported to the recovery room in good condition.  Sponge and needle count were correct at the end of the case. DD:  04/14/00 TD:  04/15/00 Job: [id number]

## 2010-11-27 NOTE — Op Note (Signed)
Kylie Tucker, Kylie Tucker                         ACCOUNT NO.:  [id number]   MEDICAL RECORD NO.:  [id number]                   PATIENT TYPE:  AMB   LOCATION:  DSC                                  FACILITY:  [facility]   PHYSICIAN:  Sandria Bales. Ezzard Standing, M.D.               DATE OF BIRTH:  01-21-1951   DATE OF PROCEDURE:  11/13/2002  DATE OF DISCHARGE:                                 OPERATIVE REPORT   PREOPERATIVE DIAGNOSIS:  Right breast cancer with three of seven nodes  involved.   POSTOPERATIVE DIAGNOSIS:  Right breast cancer with three of seven lymph  nodes involved.  Anticipate chemotherapy.   OPERATION PERFORMED:  Right simple mastectomy, left subclavian Life-Port.   SURGEON:  Sandria Bales. Ezzard Standing, M.D.   ANESTHESIA:  General endotracheal.   ESTIMATED BLOOD LOSS:  Minimal.   INDICATIONS FOR PROCEDURE:  The patient is a 60 year old white female who  underwent a right breast lumpectomy and axillary node dissection.  Her final  pathology revealed three separate postoperative cancer in her right breast  with the largest being 1.2 cm.  She had three of seven lymph nodes positive.  Because of the multifocal nature of her disease, the closeness of original  biopsies, it was felt that she would be best served with a right mastectomy  for local control.   I discussed with her the options of simple mastectomy both with and without  reconstruction.  At this time she did not want to undergo reconstruction.  She has seen Raymond Gurney C. Magrinat, M.D. who also is anticipating chemotherapy  and planned to place a subclavian Life-Port in her at the same time.  The  indications and potential complications were discussed with the patient.  Potential complications included but not limited to bleeding, infection,  pneumothorax and nerve injury.   DESCRIPTION OF PROCEDURE:  The patient was taken to the recovery room.  Her  chest was prepped with Betadine solution.  She had a roll placed behind her  back.  The  left arm was tucked, the right arm out to her side.  I first did  the mastectomy.  I made an elliptical incision which included her nipple,  her prior biopsy site.  It went out to the midaxillary line and went  medially to the lateral edge of the sternum, superiorly to approximately two  fingerbreadths below the clavicle, laterally to the latissimus dorsi muscle  and inferiorly to the investing fascia of the rectus abdominis muscle and I  elevated skin flaps in all these directions.  I then elevated the breast off  the pectoralis major muscle and off the chest wall and sent the specimen  intact whole.  I marked the long suture on the medial aspect of the breast,  a short suture cephalic and again she had the nipple and skin area orienting  anterior and posterior.   Hemostasis is controlled with Bovie electrocautery and  3-0 Vicryl sutures.  I then placed a 10 mm flat Jackson-Pratt drain in place.  I closed the  subcutaneous tissues with an interrupted 3-0 Vicryl suture, the subcuticular  5-0 Vicryl suture.  I then turned my attention to the Port-A-Cath placement.  The patient was placed in Trendelenburg position.  I accessed the left  subclavian vein with a single stick, threaded a guidewire through the 14  gauge needle into the subclavian vein and checked this with fluoroscopy.  I  then developed the reservoir site in the left upper inner breast.  I ran the  Silastic tubing from the reservoir site to the subclavian stick site into  the vein using the 8.4 French introducer and placed the tip of the catheter  at the junction of the superior vena cava and the right atrium.  I then  attached the reservoir to the Silastic tubing and sewed it in place with a 3-  0 Vicryl suture.  The reservoir lay flat.  I flushed the entire first with  dilute and then 5mL of concentrated heparin 100 units per mL.  Checked again  with fluoroscopy showing the reservoir, the tubing, the tip of the catheter.  I  then closed the wound with 3-0 Vicryl suture, the skin with a 5-0 Vicryl  suture, painted each wound with tincture of benzoin and Steri-Stripped the  wounds and sterilely dressed with the breast wound on the right side and the  Port-A-Cath wound.  The patient tolerated the procedure well.  She will be  kept overnight for instruction of the Jackson-Pratt drain and pain control.                                                Sandria Bales. Ezzard Standing, M.D.    DHN/MEDQ  D:  11/13/2002  T:  11/13/2002  Job:  [id number]   cc:   L. Lupe Carney, M.D.  [address] [place]  [place] [address]  Fax: [phone]   Edwena Felty. Romine, M.D.  [address]  [place]  [place] [address]  Fax: [phone]   Valentino Hue. Magrinat, M.D.  [address] [facility]  [place]  [place] [address]  Fax: [phone]   Elmer Sow. Dorna Bloom, M.D.  [address] - [facility]  [place]  [place]  [address]  Fax: [phone]

## 2011-02-16 ENCOUNTER — Other Ambulatory Visit: Payer: Self-pay | Admitting: Oncology

## 2011-02-16 ENCOUNTER — Encounter (HOSPITAL_BASED_OUTPATIENT_CLINIC_OR_DEPARTMENT_OTHER): Payer: 59 | Admitting: Oncology

## 2011-02-16 DIAGNOSIS — C50419 Malignant neoplasm of upper-outer quadrant of unspecified female breast: Secondary | ICD-10-CM

## 2011-02-16 DIAGNOSIS — Z170 Estrogen receptor positive status [ER+]: Secondary | ICD-10-CM

## 2011-02-16 LAB — COMPREHENSIVE METABOLIC PANEL
ALT: 20 U/L (ref 0–35)
Albumin: 3.6 g/dL (ref 3.5–5.2)
CO2: 27 mEq/L (ref 19–32)
Calcium: 9.6 mg/dL (ref 8.4–10.5)
Chloride: 103 mEq/L (ref 96–112)
Glucose, Bld: 136 mg/dL — ABNORMAL HIGH (ref 70–99)
Sodium: 141 mEq/L (ref 135–145)
Total Protein: 7.1 g/dL (ref 6.0–8.3)

## 2011-02-16 LAB — CBC WITH DIFFERENTIAL/PLATELET
BASO%: 0.6 % (ref 0.0–2.0)
Basophils Absolute: 0 10*3/uL (ref 0.0–0.1)
EOS%: 3.5 % (ref 0.0–7.0)
MCH: 30.3 pg (ref 25.1–34.0)
MCHC: 35 g/dL (ref 31.5–36.0)
MCV: 86.6 fL (ref 79.5–101.0)
MONO%: 6.1 % (ref 0.0–14.0)
NEUT%: 53.9 % (ref 38.4–76.8)
RDW: 14.1 % (ref 11.2–14.5)
lymph#: 2.7 10*3/uL (ref 0.9–3.3)

## 2011-02-17 LAB — CANCER ANTIGEN 27.29: CA 27.29: 26 U/mL (ref 0–39)

## 2011-02-22 ENCOUNTER — Ambulatory Visit: Payer: Self-pay

## 2011-02-24 ENCOUNTER — Ambulatory Visit (HOSPITAL_COMMUNITY)
Admission: RE | Admit: 2011-02-24 | Discharge: 2011-02-24 | Disposition: A | Discharge status: Home / Self Care | Payer: 59 | Source: Ambulatory Visit | Attending: Oncology | Admitting: Oncology

## 2011-02-24 ENCOUNTER — Other Ambulatory Visit (HOSPITAL_COMMUNITY): Payer: Self-pay

## 2011-02-24 ENCOUNTER — Ambulatory Visit
Admission: RE | Admit: 2011-02-24 | Discharge: 2011-02-24 | Disposition: A | Discharge status: Home / Self Care | Payer: 59 | Source: Ambulatory Visit | Attending: Oncology | Admitting: Oncology

## 2011-02-24 DIAGNOSIS — C50919 Malignant neoplasm of unspecified site of unspecified female breast: Secondary | ICD-10-CM | POA: Insufficient documentation

## 2011-02-24 DIAGNOSIS — Z9011 Acquired absence of right breast and nipple: Secondary | ICD-10-CM

## 2011-02-24 DIAGNOSIS — Z803 Family history of malignant neoplasm of breast: Secondary | ICD-10-CM | POA: Insufficient documentation

## 2011-02-24 DIAGNOSIS — Z9010 Acquired absence of unspecified breast and nipple: Secondary | ICD-10-CM

## 2011-02-24 MED ORDER — GADOBENATE DIMEGLUMINE 529 MG/ML IV SOLN
15.0000 mL | Freq: Once | INTRAVENOUS | Status: AC | PRN
  Administered 2011-02-24: 15 mL via INTRAVENOUS

## 2011-03-05 ENCOUNTER — Other Ambulatory Visit: Payer: Self-pay | Admitting: Gastroenterology

## 2011-03-05 ENCOUNTER — Other Ambulatory Visit (HOSPITAL_COMMUNITY): Payer: Self-pay | Admitting: Gastroenterology

## 2011-03-05 DIAGNOSIS — R1011 Right upper quadrant pain: Secondary | ICD-10-CM

## 2011-03-08 ENCOUNTER — Ambulatory Visit
Admission: RE | Admit: 2011-03-08 | Discharge: 2011-03-08 | Disposition: A | Discharge status: Home / Self Care | Payer: 59 | Source: Ambulatory Visit | Attending: Gastroenterology | Admitting: Gastroenterology

## 2011-03-08 DIAGNOSIS — R1011 Right upper quadrant pain: Secondary | ICD-10-CM

## 2011-03-23 ENCOUNTER — Encounter (HOSPITAL_COMMUNITY)
Admission: RE | Admit: 2011-03-23 | Discharge: 2011-03-23 | Disposition: A | Discharge status: Home / Self Care | Payer: 59 | Source: Ambulatory Visit | Attending: Gastroenterology | Admitting: Gastroenterology

## 2011-03-23 DIAGNOSIS — R1011 Right upper quadrant pain: Secondary | ICD-10-CM | POA: Insufficient documentation

## 2011-03-23 MED ORDER — TECHNETIUM TC 99M MEBROFENIN IV KIT
5.0000 | PACK | Freq: Once | INTRAVENOUS | Status: AC | PRN
  Administered 2011-03-23: 5 via INTRAVENOUS

## 2011-03-31 ENCOUNTER — Other Ambulatory Visit: Payer: Self-pay | Admitting: Gastroenterology

## 2011-04-11 ENCOUNTER — Emergency Department (HOSPITAL_COMMUNITY): Payer: 59

## 2011-04-11 ENCOUNTER — Inpatient Hospital Stay (HOSPITAL_COMMUNITY)
Admission: EM | Admit: 2011-04-11 | Discharge: 2011-04-14 | DRG: 603 | Disposition: A | Discharge status: Home / Self Care | Payer: 59 | Attending: Family Medicine | Admitting: Family Medicine

## 2011-04-11 DIAGNOSIS — Z853 Personal history of malignant neoplasm of breast: Secondary | ICD-10-CM

## 2011-04-11 DIAGNOSIS — IMO0002 Reserved for concepts with insufficient information to code with codable children: Principal | ICD-10-CM | POA: Diagnosis present

## 2011-04-11 DIAGNOSIS — I890 Lymphedema, not elsewhere classified: Secondary | ICD-10-CM | POA: Diagnosis present

## 2011-04-11 DIAGNOSIS — D72829 Elevated white blood cell count, unspecified: Secondary | ICD-10-CM | POA: Diagnosis present

## 2011-04-11 DIAGNOSIS — E785 Hyperlipidemia, unspecified: Secondary | ICD-10-CM | POA: Diagnosis present

## 2011-04-11 HISTORY — DX: Acquired absence of right breast and nipple: Z90.11

## 2011-04-11 HISTORY — DX: Reserved for concepts with insufficient information to code with codable children: IMO0002

## 2011-04-11 HISTORY — DX: Migraine, unspecified, not intractable, without status migrainosus: G43.909

## 2011-04-11 HISTORY — DX: Postmastectomy lymphedema syndrome: I97.2

## 2011-04-11 HISTORY — DX: Malignant (primary) neoplasm, unspecified: C80.1

## 2011-04-11 LAB — CBC
HCT: 42.3 % (ref 36.0–46.0)
MCH: 29.8 pg (ref 26.0–34.0)
MCHC: 33.8 g/dL (ref 30.0–36.0)
MCV: 88.1 fL (ref 78.0–100.0)
RDW: 14.1 % (ref 11.5–15.5)

## 2011-04-11 LAB — BASIC METABOLIC PANEL
BUN: 16 mg/dL (ref 6–23)
Calcium: 9.3 mg/dL (ref 8.4–10.5)
Creatinine, Ser: 0.85 mg/dL (ref 0.50–1.10)
GFR calc Af Amer: >60 mL/min (ref >60)
GFR calc non Af Amer: >60 mL/min (ref >60)
Glucose, Bld: 101 mg/dL — ABNORMAL HIGH (ref 70–99)
Potassium: 3.9 mEq/L (ref 3.5–5.1)

## 2011-04-11 LAB — DIFFERENTIAL
Basophils Absolute: 0 10*3/uL (ref 0.0–0.1)
Eosinophils Relative: 1 % (ref 0–5)
Lymphocytes Relative: 7 % — ABNORMAL LOW (ref 12–46)
Monocytes Absolute: 0.7 10*3/uL (ref 0.1–1.0)
Monocytes Relative: 5 % (ref 3–12)

## 2011-04-11 NOTE — H&P (Signed)
Kylie Tucker, Kylie Tucker NO.:  [id number]  MEDICAL RECORD NO.:  [id number]  LOCATION:  [facility]                         FACILITY:  [facility]  PHYSICIAN:  Andreas Blower, MD       DATE OF BIRTH:  October 01, 1950  DATE OF ADMISSION:  04/11/2011 DATE OF DISCHARGE:                             HISTORY & PHYSICAL   PRIMARY CARE PHYSICIAN:  Kirk Ruths, MD  CHIEF COMPLAINT:  Right upper extremity pain, redness, and swelling.  HISTORY OF PRESENT ILLNESS:  Ms. Kylie Tucker is a 60 year old Caucasian female with history of breast cancer status post mastectomy of her right breast with chemotherapy and radiation in 2004 who has chronic lymphedema of the right upper extremity with a history of cellulitis who presents with complaints of increasing swelling and redness in her left upper extremity.  She reports that when she went to bed yesterday she did not notice any redness or any swelling in her right upper extremity, however, when she woke up this morning around 8 o'clock, she noted that her right arm was swollen and red which extended all the way from her wrist to her right axilla.  As a result, she presented to the ER for further evaluation.  She reports that she has had this type of symptoms before and has had worse symptoms than previously.  The patient was started on vancomycin.  Given her cellulitis, Hospitalist Service was consulted.  The patient denies any recent fevers, but does complain of being chills.  Denies any nausea or vomiting.  Denies any chest pain or shortness of breath.  Denies any abdominal pain, diarrhea, headaches, or vision changes.  REVIEW OF SYSTEMS:  All systems were reviewed with the patient and were positive as per HPI, otherwise all other systems are negative.  PAST MEDICAL HISTORY: 1. History of right upper extremity cellulitis. 2. History of chronic lymphedema of the right upper extremity. 3. History of carcinoma of breast, status post mastectomy,  radiation     and chemotherapy. 4. History of chronic allergies. 5. History of migraines. 6. Hyperlipidemia.  SOCIAL HISTORY:  The patient is married, does not smoke, does not drink any alcohol.  FAMILY HISTORY:  Both parents are deceased.  Father had acute MI. Mother had hypertension.  PHYSICAL EXAM:  VITAL SIGNS:  Temperature is 97.8, blood pressure is 118/74, heart rate 82, respirations 18, and saturating at 97% on room air. GENERAL:  The patient was alert and oriented, did not appear to be in acute distress, was lying in bed comfortably. HEENT:  Extraocular motions are intact.  Pupils are equal and round. Had moist mucous membranes. NECK:  Supple. HEART:  Regular with S1 and S2. LUNGS:  Clear to auscultation bilaterally. ABDOMEN:  Soft, nontender, and nondistended.  Positive bowel sounds. EXTREMITIES:  Right upper extremity is swollen, has redness from right wrist which extends all the way to the right axilla. NEURO:  Cranial nerves II-XII grossly intact and 5/5 motor strength in the upper as well as lower extremities.  RADIOLOGY/IMAGING:  The patient had an x-ray of the right arm which showed dorsal soft tissue swelling.  No osseous abnormality.  LABS:  CBC shows a white count of  13.5, hemoglobin 14.3, hematocrit 42.3, and platelet count 256.  Electrolytes normal with a BUN of 16 and creatinine 0.85.  ASSESSMENT/PLAN: 1. Right upper extremity cellulitis.  Given the patient's multiple     drug allergies, we will have the patient on vancomycin and     doxycycline.  Further titration of antibiotics based on clinical     course.  We will send for blood cultures x2 if not already sent in     the ER.  We will also get right upper extremity Dopplers to     evaluate for DVT. 2. History of breast cancer.  Further management as per Dr. Truett Perna,     her oncologist as outpatient. 3. Hyperlipidemia.  Continue home medications. 4. Leukocytosis, likely due to right extremity  cellulitis. 5. History of chronic lymphedema in the right extremity.  Monitor for     now.  Does not appear to be overly swollen as compared to her left     upper extremity. Management as indicated above. 6. Prophylaxis.  Lovenox for DVT prophylaxis. 7. Code status.  The patient is a full code.  Time spent on admission talking to the patient, patient's husband and coordinating care was 50 minutes.     Andreas Blower, MD     SR/MEDQ  D:  04/11/2011  T:  04/11/2011  Job:  [id number]  Electronically Signed by Wardell Heath Kilian Schwartz  on 04/11/2011 04:58:06 PM

## 2011-04-12 ENCOUNTER — Other Ambulatory Visit (HOSPITAL_COMMUNITY): Payer: 59

## 2011-04-12 ENCOUNTER — Inpatient Hospital Stay (HOSPITAL_COMMUNITY): Payer: 59

## 2011-04-12 LAB — BASIC METABOLIC PANEL
Calcium: 8.4 mg/dL (ref 8.4–10.5)
GFR calc Af Amer: 72 mL/min — ABNORMAL LOW (ref >90)
GFR calc non Af Amer: 62 mL/min — ABNORMAL LOW (ref >90)
Potassium: 3.9 mEq/L (ref 3.5–5.1)
Sodium: 136 mEq/L (ref 135–145)

## 2011-04-12 LAB — CBC
MCH: 29.4 pg (ref 26.0–34.0)
MCHC: 33 g/dL (ref 30.0–36.0)
Platelets: 233 10*3/uL (ref 150–400)
RDW: 15 % (ref 11.5–15.5)

## 2011-04-13 ENCOUNTER — Encounter (HOSPITAL_COMMUNITY): Payer: Self-pay

## 2011-04-13 DIAGNOSIS — M7989 Other specified soft tissue disorders: Secondary | ICD-10-CM

## 2011-04-13 DIAGNOSIS — M79609 Pain in unspecified limb: Secondary | ICD-10-CM

## 2011-04-13 LAB — BASIC METABOLIC PANEL
BUN: 14 mg/dL (ref 6–23)
Chloride: 107 mEq/L (ref 96–112)
GFR calc Af Amer: 85 mL/min — ABNORMAL LOW (ref >90)
GFR calc non Af Amer: 73 mL/min — ABNORMAL LOW (ref >90)
Glucose, Bld: 101 mg/dL — ABNORMAL HIGH (ref 70–99)
Potassium: 3.9 mEq/L (ref 3.5–5.1)
Sodium: 141 mEq/L (ref 135–145)

## 2011-04-13 LAB — CBC
HCT: 36.7 % (ref 36.0–46.0)
Hemoglobin: 12.2 g/dL (ref 12.0–15.0)
MCHC: 33.2 g/dL (ref 30.0–36.0)
RDW: 14.7 % (ref 11.5–15.5)
WBC: 10.8 10*3/uL — ABNORMAL HIGH (ref 4.0–10.5)

## 2011-04-13 MED ORDER — IOHEXOL 300 MG/ML  SOLN
100.0000 mL | Freq: Once | INTRAMUSCULAR | Status: AC | PRN
  Administered 2011-04-13: 100 mL via INTRAVENOUS

## 2011-04-14 LAB — BASIC METABOLIC PANEL
BUN: 13 mg/dL (ref 6–23)
CO2: 31 mEq/L (ref 19–32)
Calcium: 8.8 mg/dL (ref 8.4–10.5)
Chloride: 104 mEq/L (ref 96–112)
Creatinine, Ser: 0.92 mg/dL (ref 0.50–1.10)
GFR calc Af Amer: 77 mL/min — ABNORMAL LOW (ref >90)
GFR calc non Af Amer: 66 mL/min — ABNORMAL LOW (ref >90)
Glucose, Bld: 95 mg/dL (ref 70–99)
Potassium: 4.1 mEq/L (ref 3.5–5.1)
Sodium: 141 mEq/L (ref 135–145)

## 2011-04-14 LAB — CBC
HCT: 36.5 % (ref 36.0–46.0)
Hemoglobin: 11.9 g/dL — ABNORMAL LOW (ref 12.0–15.0)
MCH: 28.8 pg (ref 26.0–34.0)
MCHC: 32.6 g/dL (ref 30.0–36.0)
MCV: 88.4 fL (ref 78.0–100.0)
Platelets: 257 10*3/uL (ref 150–400)
RBC: 4.13 MIL/uL (ref 3.87–5.11)
RDW: 14.6 % (ref 11.5–15.5)
WBC: 8.7 10*3/uL (ref 4.0–10.5)

## 2011-04-14 NOTE — Discharge Summary (Signed)
NAMEEUSTOLIA, DRENNEN              ACCOUNT NO.:  [id number]  MEDICAL RECORD NO.:  [id number]  LOCATION:  5007                         FACILITY:  [facility]  PHYSICIAN:  Thad Ranger, MD       DATE OF BIRTH:  Oct 24, 1950  DATE OF ADMISSION:  04/11/2011 DATE OF DISCHARGE:  04/14/2011                        DISCHARGE SUMMARY - REFERRING   PRIMARY CARE PHYSICIAN:  Kirk Ruths, MD  FINAL DISCHARGE DIAGNOSIS: 1. Right upper extremity cellulitis. 2. Right arm lymphedema. 3. History of breast cancer. 4. Hyperlipidemia. 5. Leukocytosis likely secondary to right extremity cellulitis,     improved.  FINAL DISCHARGE MEDICATIONS: 1. Doxycycline 100 mg p.o. b.i.d. for 10 more days. 2. Restoril 25 mg p.o. every 6 hours as needed for itching. 3. Alrex 1 drop each eye twice daily. 4. Carafate 1 tablet p.o. t.i.d. before meals. 5. Clarinex 5 mg p.o. daily as needed. 6. Effexor 37.5 one tablet p.o. b.i.d. 7. Flonase nasal spray one spray nasally daily at bedtime. 8. Multivitamin 1 tablet p.o. daily. 9. Protonix 40 mg p.o. daily at bedtime. 10.Sooth eye drops 1 drop in both eyes daily as needed. 11.Ursodiol 250 mg p.o. b.i.d. 12.Vitamin D2 1000 units 1 tablet p.o. daily. 13.Xyzal 5 mg p.o. daily at bedtime. 14.Xanax 3 mg p.o. daily as needed for anxiety. 15.Zetia 10 mg p.o. daily at bedtime.  BRIEF HISTORY OF PRESENT ILLNESS AT THE TIME OF ADMISSION:  Briefly, Ms. Kalina is a 60 year old female with a history of breast cancer, status post mastectomy of her right breast with chemotherapy and radiation in 2000, who has chronic lymphedema of the right upper extremity presented with cellulitis, right upper extremity pain, redness and swelling.  The patient was admitted to the Hospitalist Service.  RADIOLOGICAL DATA:  Right forearm x-ray dorsal forearm soft tissue swelling, no osseous abnormality.  Right humerus no acute findings.  CT of the forearm showed subcutaneous edema along the  forearm extending from the distal humerus, elbow to wrist may reflect cellulitis, but no drainable abscess or osteomyelitis.  No definitive findings of deep muscular compartment inflammation/involvement.  Doppler ultrasound of the right upper extremity was obtained which showed no DVT.  BRIEF HOSPITALIZATION COURSE:  Ms. Ketter is a 60 year old female who was admitted with cellulitis of the right upper extremity, superimposed on chronic lymphedema. 1. Right upper extremity cellulitis.  Given the patient's multiple     drug allergies, the patient was placed on vancomycin and     doxycycline.  She did have leukocytosis at the time of admission     with a white count of 13.5 which normalized to 8.7 at the time of     discharge.  The patient is on day 4 of vancomycin inpatient.  She     will be discharged on doxycycline for 10 days to complete the     course.  The patient was also recommended to continue her arm     elevated and also have a lymphedema clinic followup.  CT of the     right arm and Doppler ultrasounds were done to rule out any abscess, necrotizing fasciitis or any clot.  The patient will be     discharged home today.  She otherwise remained stable during the     hospitalization.  PHYSICAL EXAMINATION:  DISCHARGE VITAL SIGNS:  Temperature 97.7, pulse 70, respirations 16, blood pressure 137/68, O2 sat 98% on room air. GENERAL:  The patient is alert, awake and oriented x3, not in acute distress. HEENT:  Anicteric sclerae, pink conjunctivae, pupils reactive to light and accommodation.  EOMI. NECK:  Supple.  No lymphadenopathy, no JVD. CVS:  S1-S2 clear. CHEST:  Clear to auscultation bilaterally. ABDOMEN:  Soft, nontender, nondistended.  Normal bowel sounds. EXTREMITIES:  Right arm cellulitis significantly improved, chronic lymphedema.  DISCHARGE FOLLOWUP:  With Dr. Karleen Hampshire within next 7-10 days for hospital followup.  Discharge time 35 minutes.     Thad Ranger, MD     RR/MEDQ  D:  04/14/2011  T:  04/14/2011  Job:  [id number]  cc:   Kirk Ruths, M.D.  Electronically Signed by Andres Labrum RAI  on 04/14/2011 03:17:46 PM

## 2011-04-17 LAB — CULTURE, BLOOD (ROUTINE X 2)
Culture  Setup Time: 201209301703
Culture: NO GROWTH

## 2011-05-24 ENCOUNTER — Telehealth: Payer: Self-pay | Admitting: Oncology

## 2011-05-24 ENCOUNTER — Ambulatory Visit (HOSPITAL_BASED_OUTPATIENT_CLINIC_OR_DEPARTMENT_OTHER): Payer: 59 | Admitting: Oncology

## 2011-05-24 VITALS — BP 130/83 | HR 80 | Temp 97.8°F | Wt 152.0 lb

## 2011-05-24 DIAGNOSIS — Z853 Personal history of malignant neoplasm of breast: Secondary | ICD-10-CM

## 2011-05-24 DIAGNOSIS — IMO0002 Reserved for concepts with insufficient information to code with codable children: Secondary | ICD-10-CM

## 2011-05-24 DIAGNOSIS — C50919 Malignant neoplasm of unspecified site of unspecified female breast: Secondary | ICD-10-CM

## 2011-05-24 DIAGNOSIS — Z9010 Acquired absence of unspecified breast and nipple: Secondary | ICD-10-CM

## 2011-05-24 NOTE — Progress Notes (Signed)
ID: Kylie Tucker   Interval History: Kylie Tucker returns today for followup of her breast cancer. The interval history is generally unremarkable she continues to [employer] her old job in [place]. She and her husband are planning to go to the beach where they have a small condo for the holidays..  ROS:  Aside from the problem with the right upper extremity cellulitis she has some GERD symptoms which of course are not new a little bit of seasonal allergy and her frequent headaches which have not changed in intensity or frequency, have not been accompanied by visual changes dizziness nausea vomiting photophobia or stiff neck a detailed review of systems was otherwise noncontributory    Medications: I have reviewed the patient's current medications. Dr Matthias Hughs has recently added sucralfate to her list.        Objective: Vital signs in last 24 hours: BP 130/83  Pulse 80  Temp(Src) 97.8 F (36.6 C) (Oral)  Wt 152 lb (68.947 kg)   Physical Exam:    Sclerae unicteric  Oropharynx clear  No peripheral adenopathy  Lungs clear -- no rales or rhonchi  Heart regular rate and rhythm  Abdomen benign  MSK no focal spinal tenderness, no peripheral edema  Neuro nonfocal  Breast exam: The right breast is status post mastectomy there is no evidence of local recurrence the left breast shows no suspicious masses  Lab Results:   BMET    Component Value Date/Time   NA 141 04/14/2011 0523   K 4.1 04/14/2011 0523   CL 104 04/14/2011 0523   CO2 31 04/14/2011 0523   GLUCOSE 95 04/14/2011 0523   BUN 13 04/14/2011 0523   CREATININE 0.92 04/14/2011 0523   CALCIUM 8.8 04/14/2011 0523   GFRNONAA 66* 04/14/2011 0523   GFRAA 77* 04/14/2011 0523     CMP     Component Value Date/Time   NA 141 04/14/2011 0523   K 4.1 04/14/2011 0523   CL 104 04/14/2011 0523   CO2 31 04/14/2011 0523   GLUCOSE 95 04/14/2011 0523   BUN 13 04/14/2011 0523   CREATININE 0.92 04/14/2011 0523   CALCIUM 8.8 04/14/2011 0523   PROT 7.1  02/16/2011 1626   ALBUMIN 3.6 02/16/2011 1626   AST 21 02/16/2011 1626   ALT 20 02/16/2011 1626   ALKPHOS 99 02/16/2011 1626   BILITOT 0.2* 02/16/2011 1626   GFRNONAA 66* 04/14/2011 0523   GFRAA 77* 04/14/2011 0523    CBC    Component Value Date/Time   WBC 8.7 04/14/2011 0523   RBC 4.13 04/14/2011 0523   HGB 11.9* 04/14/2011 0523   HGB 13.0 02/16/2011 1626   HCT 36.5 04/14/2011 0523   HCT 37.3 02/16/2011 1626   PLT 257 04/14/2011 0523   PLT 228 02/16/2011 1626   MCV 88.4 04/14/2011 0523   MCV 86.6 02/16/2011 1626   MCH 28.8 04/14/2011 0523   MCHC 32.6 04/14/2011 0523   RDW 14.6 04/14/2011 0523   LYMPHSABS 1.0 04/11/2011 1300   MONOABS 0.7 04/11/2011 1300   EOSABS 0.1 04/11/2011 1300   EOSABS 0.3 02/16/2011 1626   BASOSABS 0.0 04/11/2011 1300   BASOSABS 0.0 02/16/2011 1626        Studies/Results: She had a left mammogram and bilateral breast MRIs in August most showing no evidence of disease recurrence  Assessment: 60 year old Kylie Tucker woman is post right mastectomy May of 2004 for a 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 She took tamoxifen between December of 2004 and July of 2006 at which point she was switched to anastrozole, completing her 5 years of anastrozole in December of 2011   Plan: He is doing fine from a breast cancer point of view. I have offered her a compression sleeve but she prefers to read her right upper extremity because she can control the pressure better that way she says. I am moving her appointment next year to September so it will be closer to her mammography and no changes and we are only following her with observation at this point. She knows to call for any problems that may develop before the next visit  MAGRINAT,GUSTAV C 05/24/2011

## 2011-05-24 NOTE — Telephone Encounter (Signed)
Gv pt appt for Sept2013

## 2011-06-28 ENCOUNTER — Emergency Department (HOSPITAL_COMMUNITY)
Admission: EM | Admit: 2011-06-28 | Discharge: 2011-06-28 | Disposition: A | Discharge status: Home / Self Care | Payer: 59 | Attending: Emergency Medicine | Admitting: Emergency Medicine

## 2011-06-28 ENCOUNTER — Encounter (HOSPITAL_COMMUNITY): Payer: Self-pay | Admitting: Emergency Medicine

## 2011-06-28 DIAGNOSIS — L03113 Cellulitis of right upper limb: Secondary | ICD-10-CM

## 2011-06-28 DIAGNOSIS — M79609 Pain in unspecified limb: Secondary | ICD-10-CM | POA: Insufficient documentation

## 2011-06-28 DIAGNOSIS — IMO0002 Reserved for concepts with insufficient information to code with codable children: Secondary | ICD-10-CM | POA: Insufficient documentation

## 2011-06-28 LAB — DIFFERENTIAL
Basophils Absolute: 0.1 10*3/uL (ref 0.0–0.1)
Eosinophils Absolute: 0.2 10*3/uL (ref 0.0–0.7)
Lymphocytes Relative: 36 % (ref 12–46)
Lymphs Abs: 2.5 10*3/uL (ref 0.7–4.0)
Neutrophils Relative %: 53 % (ref 43–77)

## 2011-06-28 LAB — POCT I-STAT, CHEM 8
BUN: 17 mg/dL (ref 6–23)
Creatinine, Ser: 1 mg/dL (ref 0.50–1.10)
Hemoglobin: 13.6 g/dL (ref 12.0–15.0)
Potassium: 3.9 mEq/L (ref 3.5–5.1)
Sodium: 142 mEq/L (ref 135–145)

## 2011-06-28 LAB — CBC
Platelets: 270 10*3/uL (ref 150–400)
RBC: 4.54 MIL/uL (ref 3.87–5.11)
WBC: 6.8 10*3/uL (ref 4.0–10.5)

## 2011-06-28 MED ORDER — DOXYCYCLINE HYCLATE 100 MG PO CAPS: 100.0000 mg | ORAL_CAPSULE | Freq: Two times a day (BID) | ORAL | Status: AC

## 2011-06-28 MED ORDER — DOXYCYCLINE HYCLATE 100 MG PO TABS
100.0000 mg | ORAL_TABLET | Freq: Once | ORAL | Status: AC
  Administered 2011-06-28: 100 mg via ORAL
  Filled 2011-06-28: qty 1

## 2011-06-28 NOTE — ED Notes (Signed)
Pet pt, came to the hospital because she has cellulitis in R arm. R arm is edematous, red, and warm to touch. Pain started on 06/27/11. Pulses and capillary refill in intact. Pt has hx of cellulitis in R arm since 2004. Pt does not have lymph nodes in R arm. Will continue to monitor.

## 2011-06-28 NOTE — ED Provider Notes (Signed)
History     CSN: [id number] Arrival date & time: 06/28/2011  2:38 PM   First MD Initiated Contact with Patient 06/28/11 2031      Chief Complaint  Patient presents with  . Arm Pain    (Consider location/radiation/quality/duration/timing/severity/associated sxs/prior treatment) HPI Comments: Ms. Marshell Levan has a history of frequent cellulitis of the right upper extremity.  She has a history of right mastectomy.  She has been hospitalized several times in the past receiving vancomycin for the cellulitis last being November of this year.  Yesterday.  She noticed the arm felt warm and was pink in color.  She normally has lymphadenopathy of the postmastectomy.  She has no myalgias.  No fever   Patient is a 60 y.o. female presenting with arm pain.  Arm Pain This is a recurrent problem. The current episode started yesterday. The problem occurs constantly. The problem has been gradually worsening. Pertinent negatives include no arthralgias, fever, joint swelling, myalgias, numbness or weakness. The symptoms are aggravated by nothing. She has tried nothing for the symptoms.    Past Medical History  Diagnosis Date  . Cancer rt breast  . Cellulitis and abscess of upper arm and forearm   . Postmastectomy lymphedema rt side  . History of right mastectomy   . Migraines     History reviewed. No pertinent past surgical history.  History reviewed. No pertinent family history.  History  Substance Use Topics  . Smoking status: Not on file  . Smokeless tobacco: Not on file  . Alcohol Use: Not on file    OB History    Grav Para Term Preterm Abortions TAB SAB Ect Mult Living                  Review of Systems  Constitutional: Negative for fever.  HENT: Negative.   Eyes: Negative.   Respiratory: Negative.   Cardiovascular: Negative.   Gastrointestinal: Negative.   Genitourinary: Negative.   Musculoskeletal: Negative for myalgias, joint swelling and arthralgias.  Skin: Positive for color  change. Negative for wound.  Neurological: Negative for weakness and numbness.  Hematological: Negative.   Psychiatric/Behavioral: Negative.     Allergies  Acetaminophen; Cefuroxime axetil; Cephalexin; Clarithromycin; Codeine; Hydrocodone-acetaminophen; NWG:NFAOZHYQMVH+QIONGEXBM+WUXLKGMWNU acid+aspartame; Levofloxacin; Morphine and related; Ondansetron; Propoxyphene n-acetaminophen; Rosuvastatin; Sulfonamide derivatives; and Vioxx  Home Medications   Current Outpatient Rx  Name Route Sig Dispense Refill  . ALPRAZOLAM ER 0.5 MG PO TB24 Oral Take 0.5 mg by mouth 2 (two) times daily as needed. For anxiety     . CALCIUM CARBONATE 600 MG PO TABS Oral Take 600 mg by mouth 2 (two) times daily with a meal.      . VITAMIN D 1000 UNITS PO TABS Oral Take 2,000 Units by mouth daily.      . CYCLOSPORINE 0.05 % OP EMUL  1 drop 2 (two) times daily.      Marland Kitchen EZETIMIBE 10 MG PO TABS Oral Take 10 mg by mouth daily.      Marland Kitchen FLUTICASONE PROPIONATE 50 MCG/ACT NA SUSP Nasal Place 2 sprays into the nose daily.      Marland Kitchen HYDROXYZINE HCL 25 MG PO TABS Oral Take 25 mg by mouth every 6 (six) hours as needed. For itching     . LEVOCETIRIZINE DIHYDROCHLORIDE 5 MG PO TABS Oral Take 5 mg by mouth every evening.      Carma Leaven M PLUS PO TABS Oral Take 1 tablet by mouth daily.      Marland Kitchen PANTOPRAZOLE SODIUM  40 MG PO TBEC Oral Take 40 mg by mouth daily.      . VENLAFAXINE HCL 37.5 MG PO CP24 Oral Take 37.5 mg by mouth 2 (two) times daily.      Marland Kitchen DOXYCYCLINE HYCLATE 100 MG PO CAPS Oral Take 1 capsule (100 mg total) by mouth 2 (two) times daily. 20 capsule 0    BP 123/73  Pulse 73  Temp(Src) 97.3 F (36.3 C) (Oral)  Resp 16  Ht 5\' 2"  (1.575 m)  Wt 148 lb (67.132 kg)  BMI 27.07 kg/m2  SpO2 98%  Physical Exam  Constitutional: She is oriented to person, place, and time. She appears well-developed and well-nourished.  HENT:  Head: Normocephalic.  Eyes: Pupils are equal, round, and reactive to light.  Neck: Normal range of  motion.  Cardiovascular: Normal rate.   Pulmonary/Chest: Breath sounds normal.  Musculoskeletal:       Right arm swollen, which patient states is normal.   Warm to touch, has positive adenopathy in the axilla, which is tender.  Good, strong distal pulses.  Full range of motion of all joints, wrist, elbow and shoulder, no open areas. , the discoloration extends to the anterior shoulder/chest wall   Neurological: She is oriented to person, place, and time.  Skin: There is erythema.  Psychiatric: She has a normal mood and affect.    ED Course  Procedures (including critical care time)  Labs Reviewed  POCT I-STAT, CHEM 8 - Abnormal; Notable for the following:    Glucose, Bld 121 (*)    All other components within normal limits  CBC  DIFFERENTIAL  I-STAT, CHEM 8   No results found.   1. Cellulitis of arm, right     Discussed lab results with patient and choice of antibiotics with Dr. Judd Lien  Patient will watch arm carefully for increased erythema, swelling, pain.  She was instructed not to wrap the arm tomorrow to keep it elevated as much as possible.  She may return to work on Wednesday, and wrapped the arm per her normal routine to avoid abrasions nicks in the skin  MDM  Will obtain CBC i-STAT        Arman Filter, NP 06/28/11 2042  Arman Filter, NP 06/28/11 2311  Arman Filter, NP 06/28/11 2311

## 2011-06-28 NOTE — ED Notes (Signed)
Pt c/o cellulitis in left arm; pt with redness and swelling in left arm; pt sts hx of same; CMS intact

## 2011-06-30 NOTE — ED Provider Notes (Signed)
Medical screening examination/treatment/procedure(s) were performed by non-physician practitioner and as supervising physician I was immediately available for consultation/collaboration.   Geoffery Lyons, MD 06/30/11 [phone]

## 2011-08-26 ENCOUNTER — Encounter (INDEPENDENT_AMBULATORY_CARE_PROVIDER_SITE_OTHER): Payer: Self-pay | Admitting: Surgery

## 2011-08-30 ENCOUNTER — Telehealth (INDEPENDENT_AMBULATORY_CARE_PROVIDER_SITE_OTHER): Payer: Self-pay | Admitting: Surgery

## 2011-08-30 NOTE — Telephone Encounter (Signed)
I left the patient a voicemail the times that are available.  I do not have very early or late available.  The dr has limited time in April.  I told her to call back and schedule what she could do.

## 2011-09-06 ENCOUNTER — Other Ambulatory Visit (HOSPITAL_COMMUNITY): Payer: Self-pay | Admitting: Internal Medicine

## 2011-09-06 DIAGNOSIS — J0110 Acute frontal sinusitis, unspecified: Secondary | ICD-10-CM

## 2011-09-08 ENCOUNTER — Encounter (HOSPITAL_COMMUNITY): Payer: Self-pay

## 2011-09-08 ENCOUNTER — Ambulatory Visit (HOSPITAL_COMMUNITY)
Admission: RE | Admit: 2011-09-08 | Discharge: 2011-09-08 | Disposition: A | Discharge status: Home / Self Care | Payer: 59 | Source: Ambulatory Visit | Attending: Internal Medicine | Admitting: Internal Medicine

## 2011-09-08 DIAGNOSIS — J328 Other chronic sinusitis: Secondary | ICD-10-CM | POA: Insufficient documentation

## 2011-09-08 DIAGNOSIS — J0110 Acute frontal sinusitis, unspecified: Secondary | ICD-10-CM | POA: Insufficient documentation

## 2011-09-08 DIAGNOSIS — C50911 Malignant neoplasm of unspecified site of right female breast: Secondary | ICD-10-CM | POA: Insufficient documentation

## 2011-09-08 DIAGNOSIS — R51 Headache: Secondary | ICD-10-CM | POA: Insufficient documentation

## 2011-09-27 ENCOUNTER — Encounter (INDEPENDENT_AMBULATORY_CARE_PROVIDER_SITE_OTHER): Payer: Self-pay | Admitting: Surgery

## 2011-09-29 ENCOUNTER — Encounter (INDEPENDENT_AMBULATORY_CARE_PROVIDER_SITE_OTHER): Payer: Self-pay | Admitting: Surgery

## 2011-09-29 ENCOUNTER — Encounter (INDEPENDENT_AMBULATORY_CARE_PROVIDER_SITE_OTHER): Payer: Self-pay | Admitting: General Surgery

## 2011-09-29 ENCOUNTER — Ambulatory Visit (INDEPENDENT_AMBULATORY_CARE_PROVIDER_SITE_OTHER): Payer: 59 | Admitting: Surgery

## 2011-09-29 VITALS — BP 114/72 | HR 72 | Temp 97.8°F | Resp 16 | Ht 62.25 in | Wt 152.0 lb

## 2011-09-29 DIAGNOSIS — Z9011 Acquired absence of right breast and nipple: Secondary | ICD-10-CM

## 2011-09-29 DIAGNOSIS — Z9010 Acquired absence of unspecified breast and nipple: Secondary | ICD-10-CM

## 2011-09-29 NOTE — Progress Notes (Signed)
[facility]  Kylie Kin, MD,  FACS [address] [place]  [address] [place], [place]    [address] Phone:  [phone] FAX:  [phone]   Re:   Kylie Tucker (was Kylie Tucker) DOB:   09/02/1950 MRN:   [id number]  ASSESSMENT AND PLAN: 1.  T1, N1 right breast cancer.  (3/7 nodes positive)  Right mastectomy - 11/13/2002.  Sees Dr. Patrice Paradise.  Disease free.  Will see me back in one year.  2.  Lymphedema right arm.  HISTORY OF PRESENT ILLNESS: Chief Complaint  Patient presents with  . Breast Cancer Long Term Follow Up    Rt mastectomy     Kylie Tucker is a 61 y.o. (DOB: April 17, 1951)  white female who is a patient of Kylie Ruths, MD, MD and comes to me today for follow up of right breast cancer.  Doing well.  No change in her lymphedema.  No new mass or area of concern.  In good spirits.   PHYSICAL EXAM: BP 114/72  Pulse 72  Temp(Src) 97.8 F (36.6 C) (Temporal)  Resp 16  Ht 5' 2.25" (1.581 m)  Wt 152 lb (68.947 kg)  BMI 27.58 kg/m2  HEENT:  Pupils equal.  Dentition good.  No injury. NECK:  Supple.  No thyroid mass. LYMPH NODES:  No cervical, supraclavicular, or axillary adenopathy. BREASTS -  RIGHT:  Absent.  No palpable mass or nodule.   LEFT:  No palpable mass or nodule.  No nipple discharge. UPPER EXTREMITIES:  No evidence of lymphedema.  DATA REVIEWED: Mammogram  02/24/2011 - negative  Kylie Kin, MD, FACS Office:  [phone]

## 2012-01-01 ENCOUNTER — Emergency Department (HOSPITAL_COMMUNITY)
Admission: EM | Admit: 2012-01-01 | Discharge: 2012-01-01 | Disposition: A | Discharge status: Home / Self Care | Payer: 59 | Attending: Emergency Medicine | Admitting: Emergency Medicine

## 2012-01-01 ENCOUNTER — Encounter (HOSPITAL_COMMUNITY): Payer: Self-pay | Admitting: Physical Medicine and Rehabilitation

## 2012-01-01 DIAGNOSIS — E785 Hyperlipidemia, unspecified: Secondary | ICD-10-CM | POA: Insufficient documentation

## 2012-01-01 DIAGNOSIS — Z9010 Acquired absence of unspecified breast and nipple: Secondary | ICD-10-CM | POA: Insufficient documentation

## 2012-01-01 DIAGNOSIS — Z923 Personal history of irradiation: Secondary | ICD-10-CM | POA: Insufficient documentation

## 2012-01-01 DIAGNOSIS — Z881 Allergy status to other antibiotic agents status: Secondary | ICD-10-CM | POA: Insufficient documentation

## 2012-01-01 DIAGNOSIS — Z9221 Personal history of antineoplastic chemotherapy: Secondary | ICD-10-CM | POA: Insufficient documentation

## 2012-01-01 DIAGNOSIS — Z888 Allergy status to other drugs, medicaments and biological substances status: Secondary | ICD-10-CM | POA: Insufficient documentation

## 2012-01-01 DIAGNOSIS — Z853 Personal history of malignant neoplasm of breast: Secondary | ICD-10-CM | POA: Insufficient documentation

## 2012-01-01 DIAGNOSIS — Z8249 Family history of ischemic heart disease and other diseases of the circulatory system: Secondary | ICD-10-CM | POA: Insufficient documentation

## 2012-01-01 DIAGNOSIS — B029 Zoster without complications: Secondary | ICD-10-CM | POA: Insufficient documentation

## 2012-01-01 DIAGNOSIS — Z803 Family history of malignant neoplasm of breast: Secondary | ICD-10-CM | POA: Insufficient documentation

## 2012-01-01 MED ORDER — VALACYCLOVIR HCL 1 G PO TABS: 1000.0000 mg | ORAL_TABLET | Freq: Three times a day (TID) | ORAL | Status: AC

## 2012-01-01 MED ORDER — HYDROXYZINE HCL 25 MG PO TABS
25.0000 mg | ORAL_TABLET | Freq: Once | ORAL | Status: AC
  Administered 2012-01-01: 25 mg via ORAL
  Filled 2012-01-01: qty 1

## 2012-01-01 MED ORDER — OXYCODONE HCL 5 MG PO TABS: 5.0000 mg | ORAL_TABLET | Freq: Once | ORAL | Status: DC

## 2012-01-01 MED ORDER — HYDROXYZINE HCL 25 MG PO TABS: 25.0000 mg | ORAL_TABLET | Freq: Three times a day (TID) | ORAL | Status: AC | PRN

## 2012-01-01 MED ORDER — VALACYCLOVIR HCL 500 MG PO TABS
1000.0000 mg | ORAL_TABLET | ORAL | Status: AC
  Administered 2012-01-01: 1000 mg via ORAL
  Filled 2012-01-01: qty 2

## 2012-01-01 MED ORDER — OXYCODONE HCL 5 MG PO TABS
10.0000 mg | ORAL_TABLET | Freq: Once | ORAL | Status: AC
  Administered 2012-01-01: 10 mg via ORAL
  Filled 2012-01-01: qty 2

## 2012-01-01 MED ORDER — OXYCODONE HCL 5 MG PO TABS
ORAL_TABLET | ORAL | Status: DC
Start: 2012-01-01 — End: 2013-05-28

## 2012-01-01 NOTE — ED Notes (Signed)
Patients bp palpated was 130

## 2012-01-01 NOTE — ED Provider Notes (Signed)
History   This chart was scribed for Cyndra Numbers, MD scribed by Magnus Sinning. The patient was seen in room TR05C/TR05C seen at 18:26   CSN: [id number]  Arrival date & time 01/01/12  1536   First MD Initiated Contact with Patient 01/01/12 1733      Chief Complaint  Patient presents with  . Rash    (Consider location/radiation/quality/duration/timing/severity/associated sxs/prior treatment) HPI Kylie Tucker is a 61 y.o. female who presents to the Emergency Department complaining of constant moderate stinging pruritic rash located at bilateral ribcage areas with associated nausea onset 3 days.Patient states that areas began as three small areas on her right side just inferior to the axilla that has now spread to both her right and left sides of ribcage.  She explains she has hx of cellulitis, which she explains is not similar to current areas.Patient states that she was seen today by PCP and given Cipro for respiratory issue, a steroid cream for current rash.  She reports that her PCP thought the rash might initially be shingles but then felt the distribution did not fit wit that diagnosis.  Patient came to the ED when she reports the stinging and itching pain worsened.  She has history of chicken pox as a child.. Denies vomiting, fevers, hx of shingles.   Past Medical History  Diagnosis Date  . Cellulitis and abscess of upper arm and forearm   . Postmastectomy lymphedema rt side  . History of right mastectomy 2004    chemo/ radiation  . Migraines   . Cancer rt breast    2004/ chemo/ radiation  . Allergy   . Hyperlipidemia     Past Surgical History  Procedure Date  . Rt mastectomy 2004  . Nasal sinus surgery 1990/2001  . Breast lumpectomy     right  . Mandible surgery 1991    Family History  Problem Relation Age of Onset  . Heart disease Father   . Cancer Maternal Aunt     breast  . Heart disease Paternal Uncle   . Heart disease Paternal Grandmother   . Heart  disease Paternal Grandfather     History  Substance Use Topics  . Smoking status: Never Smoker   . Smokeless tobacco: Not on file  . Alcohol Use: No   Review of Systems 10 Systems reviewed and are negative for acute change except as noted in the HPI. Allergies  Acetaminophen; Amoxicillin-pot clavulanate; Cefuroxime axetil; Cephalexin; Clarithromycin; Codeine; Hydrocodone-acetaminophen; Levofloxacin; Morphine and related; Ondansetron; Propoxyphene-acetaminophen; Rosuvastatin; Sulfonamide derivatives; and Vioxx  Home Medications   Current Outpatient Rx  Name Route Sig Dispense Refill  . ALPRAZOLAM ER 0.5 MG PO TB24 Oral Take 0.5 mg by mouth 2 (two) times daily as needed. For anxiety     . BETAMETHASONE VALERATE 0.1 % EX CREA Topical Apply 1 application topically 2 (two) times daily.    Marland Kitchen CALCIUM CARBONATE 600 MG PO TABS Oral Take 600 mg by mouth 2 (two) times daily with a meal.      . VITAMIN D 2000 UNITS PO CAPS Oral Take 1 capsule by mouth daily.     . CYCLOBENZAPRINE HCL 10 MG PO TABS Oral Take 10 mg by mouth daily as needed. For pain    . EZETIMIBE 10 MG PO TABS Oral Take 10 mg by mouth daily.      Marland Kitchen FLUTICASONE PROPIONATE 50 MCG/ACT NA SUSP Nasal Place 2 sprays into the nose daily.      Marland Kitchen HYDROXYZINE HCL 25  MG PO TABS Oral Take 25 mg by mouth every 6 (six) hours as needed. For itching     . LEVOCETIRIZINE DIHYDROCHLORIDE 5 MG PO TABS Oral Take 5 mg by mouth every evening.      Marland Kitchen MELOXICAM 7.5 MG PO TABS Oral Take 7.5 mg by mouth Ad lib.     Carma Leaven M PLUS PO TABS Oral Take 1 tablet by mouth daily.      Marland Kitchen OVER THE COUNTER MEDICATION Ophthalmic Apply 2 drops to eye daily. rephresh eye drops. Uses in both eyes    . PANTOPRAZOLE SODIUM 40 MG PO TBEC Oral Take 40 mg by mouth daily.      . VENLAFAXINE HCL ER 37.5 MG PO CP24 Oral Take 37.5 mg by mouth 2 (two) times daily.        BP 170/76  Pulse 80  Temp 98.1 F (36.7 C) (Oral)  Resp 16  Ht 5\' 2"  (1.575 m)  Wt 150 lb (68.04 kg)   BMI 27.44 kg/m2  SpO2 99%  Physical Exam GEN: Well-developed, well-nourished female in no distress HEENT: Atraumatic, normocephalic.  EYES: PERRLA BL, no scleral icterus. NECK: Trachea midline, no meningismus CV: regular rate and rhythm.  PULM: No respiratory distress. S/p right mastectomy Neuro: cranial nerves grossly 2-12 intact, no abnormalities of strength or sensation, A and O x 3 MSK: Patient moves all 4 extremities symmetrically, no deformity, edema, or injury noted Skin: inferior to R axillary area there's a patch that is about 5 x 3 inches with vesicular lesions in it. Also 4 erythematous macular areas that are one inch in diameter and erythematous without fluctuance or induration that are consistent with insect bites.  2 are located along the right lateral chest wall one is more posterior on that side.  There is also an isolated identical lesion over the left anterior chest wall just inferior to the left breast.   Psych: no abnormality of mood ED Course  Procedures (including critical care time) DIAGNOSTIC STUDIES: Oxygen Saturation is 99% on room air, normal by my interpretation.    COORDINATION OF CARE:  19:39: EDMD provides information to d/c home with Valtrex to take 1000 mg TID.   1. Shingles       MDM  Patient was evaluated by myself and found to have 2 types of rashes.  There are 4 areas that appear consistent with insect bites.  The large patch over the patient's right side is very different in appearance and concerning for shingles.  Rash was not consistent with cellulitis, abscess, or other emergent conditions such as SJS or TEN.  Patient was given hydroxyzine for her itching and valtrex for shingles.  Patient was given oxycodone for pain after old inpatient records were reviewed with pharmacy and patient was found to be successfully treated with this without allergic reaction in the past.  Patient was discharged with prescriptions for all of these and told to  follow-up with her regular doctor if she does not experience significant improvement. I personally performed the services described in this documentation, which was scribed in my presence. The recorded information has been reviewed and considered.           Cyndra Numbers, MD 01/01/12 2242

## 2012-01-01 NOTE — Discharge Instructions (Signed)

## 2012-01-01 NOTE — ED Notes (Signed)
Pt presents to department for evaluation of rash to bilateral rib cage areas. Ongoing x3 days. Pt states sore red areas, started on R side. Also states severe itching. Airway intact, respirations unlabored. She is alert and oriented x4. No signs of acute distress noted.

## 2012-02-10 HISTORY — PX: ORIF METACARPAL FRACTURE: SUR940

## 2012-02-11 ENCOUNTER — Other Ambulatory Visit: Payer: Self-pay | Admitting: Orthopedic Surgery

## 2012-02-11 ENCOUNTER — Encounter (HOSPITAL_BASED_OUTPATIENT_CLINIC_OR_DEPARTMENT_OTHER): Payer: Self-pay | Admitting: *Deleted

## 2012-02-11 NOTE — Progress Notes (Signed)
This is pts rt hand-she had Rt mastectomy 04 Has lymphadema and had cellulitis in this arm-no iv or bp in rt arm

## 2012-02-14 ENCOUNTER — Encounter (HOSPITAL_BASED_OUTPATIENT_CLINIC_OR_DEPARTMENT_OTHER): Payer: Self-pay | Admitting: Anesthesiology

## 2012-02-14 ENCOUNTER — Encounter (HOSPITAL_BASED_OUTPATIENT_CLINIC_OR_DEPARTMENT_OTHER)
Admission: RE | Disposition: A | Discharge status: Home / Self Care | Payer: Self-pay | Source: Ambulatory Visit | Attending: Orthopedic Surgery

## 2012-02-14 ENCOUNTER — Ambulatory Visit (HOSPITAL_BASED_OUTPATIENT_CLINIC_OR_DEPARTMENT_OTHER): Payer: 59 | Admitting: Anesthesiology

## 2012-02-14 ENCOUNTER — Ambulatory Visit (HOSPITAL_BASED_OUTPATIENT_CLINIC_OR_DEPARTMENT_OTHER)
Admission: RE | Admit: 2012-02-14 | Discharge: 2012-02-14 | Disposition: A | Discharge status: Home / Self Care | Payer: 59 | Source: Ambulatory Visit | Attending: Orthopedic Surgery | Admitting: Orthopedic Surgery

## 2012-02-14 ENCOUNTER — Encounter (HOSPITAL_BASED_OUTPATIENT_CLINIC_OR_DEPARTMENT_OTHER): Payer: Self-pay | Admitting: Orthopedic Surgery

## 2012-02-14 DIAGNOSIS — W19XXXA Unspecified fall, initial encounter: Secondary | ICD-10-CM | POA: Insufficient documentation

## 2012-02-14 DIAGNOSIS — K219 Gastro-esophageal reflux disease without esophagitis: Secondary | ICD-10-CM | POA: Insufficient documentation

## 2012-02-14 DIAGNOSIS — S62309A Unspecified fracture of unspecified metacarpal bone, initial encounter for closed fracture: Secondary | ICD-10-CM | POA: Insufficient documentation

## 2012-02-14 HISTORY — DX: Gastro-esophageal reflux disease without esophagitis: K21.9

## 2012-02-14 HISTORY — PX: ORIF FINGER FRACTURE: SHX2122

## 2012-02-14 HISTORY — DX: Other specified postprocedural states: Z98.890

## 2012-02-14 HISTORY — DX: Nausea with vomiting, unspecified: R11.2

## 2012-02-14 SURGERY — OPEN REDUCTION INTERNAL FIXATION (ORIF) METACARPAL (FINGER) FRACTURE
Anesthesia: General | Site: Finger | Laterality: Right | Wound class: Clean

## 2012-02-14 MED ORDER — MIDAZOLAM HCL 5 MG/5ML IJ SOLN
INTRAMUSCULAR | Status: DC | PRN
  Administered 2012-02-14: 2 mg via INTRAVENOUS

## 2012-02-14 MED ORDER — VANCOMYCIN HCL IN DEXTROSE 1-5 GM/200ML-% IV SOLN
1000.0000 mg | INTRAVENOUS | Status: AC
  Administered 2012-02-14: 1000 mg via INTRAVENOUS

## 2012-02-14 MED ORDER — CHLORHEXIDINE GLUCONATE 4 % EX LIQD: 60.0000 mL | Freq: Once | CUTANEOUS | Status: DC

## 2012-02-14 MED ORDER — DOXYCYCLINE HYCLATE 100 MG PO TABS: 100.0000 mg | ORAL_TABLET | Freq: Two times a day (BID) | ORAL | Status: AC

## 2012-02-14 MED ORDER — DEXAMETHASONE SODIUM PHOSPHATE 4 MG/ML IJ SOLN
INTRAMUSCULAR | Status: DC | PRN
  Administered 2012-02-14: 10 mg via INTRAVENOUS

## 2012-02-14 MED ORDER — METOCLOPRAMIDE HCL 5 MG/ML IJ SOLN: 10.0000 mg | Freq: Once | INTRAMUSCULAR | Status: DC | PRN

## 2012-02-14 MED ORDER — FENTANYL CITRATE 0.05 MG/ML IJ SOLN
INTRAMUSCULAR | Status: DC | PRN
  Administered 2012-02-14 (×2): 25 ug via INTRAVENOUS

## 2012-02-14 MED ORDER — METOCLOPRAMIDE HCL 5 MG/ML IJ SOLN
INTRAMUSCULAR | Status: DC | PRN
  Administered 2012-02-14: 10 mg via INTRAVENOUS

## 2012-02-14 MED ORDER — LIDOCAINE HCL (CARDIAC) 20 MG/ML IV SOLN
INTRAVENOUS | Status: DC | PRN
  Administered 2012-02-14: 100 mg via INTRAVENOUS

## 2012-02-14 MED ORDER — PROPOFOL 10 MG/ML IV EMUL
INTRAVENOUS | Status: DC | PRN
  Administered 2012-02-14: 200 mg via INTRAVENOUS

## 2012-02-14 MED ORDER — SCOPOLAMINE 1 MG/3DAYS TD PT72
1.0000 | MEDICATED_PATCH | Freq: Once | TRANSDERMAL | Status: DC
  Administered 2012-02-14: 1.5 mg via TRANSDERMAL

## 2012-02-14 MED ORDER — OXYCODONE HCL 5 MG/5ML PO SOLN: 5.0000 mg | Freq: Once | ORAL | Status: AC | PRN

## 2012-02-14 MED ORDER — BUPIVACAINE HCL (PF) 0.25 % IJ SOLN
INTRAMUSCULAR | Status: DC | PRN
  Administered 2012-02-14: 7 mL

## 2012-02-14 MED ORDER — HYDROMORPHONE HCL PF 1 MG/ML IJ SOLN
0.2500 mg | INTRAMUSCULAR | Status: DC | PRN
  Administered 2012-02-14: 0.25 mg via INTRAVENOUS
  Administered 2012-02-14: 0.5 mg via INTRAVENOUS

## 2012-02-14 MED ORDER — OXYCODONE HCL 5 MG PO TABS
5.0000 mg | ORAL_TABLET | Freq: Once | ORAL | Status: AC | PRN
  Administered 2012-02-14: 5 mg via ORAL

## 2012-02-14 MED ORDER — LACTATED RINGERS IV SOLN
INTRAVENOUS | Status: DC
  Administered 2012-02-14 (×3): via INTRAVENOUS

## 2012-02-14 MED ORDER — GLYCOPYRROLATE 0.2 MG/ML IJ SOLN
INTRAMUSCULAR | Status: DC | PRN
  Administered 2012-02-14: 0.2 mg via INTRAVENOUS

## 2012-02-14 SURGICAL SUPPLY — 55 items
BANDAGE GAUZE ELAST BULKY 4 IN (GAUZE/BANDAGES/DRESSINGS) ×1 IMPLANT
BIT DRILL QC 1.1MM (BIT) IMPLANT
BLADE MINI RND TIP GREEN BEAV (BLADE) IMPLANT
BLADE SURG 15 STRL LF DISP TIS (BLADE) ×1 IMPLANT
BLADE SURG 15 STRL SS (BLADE) ×2
BNDG CMPR 9X4 STRL LF SNTH (GAUZE/BANDAGES/DRESSINGS) ×1
BNDG COHESIVE 3X5 TAN STRL LF (GAUZE/BANDAGES/DRESSINGS) ×11 IMPLANT
BNDG ESMARK 4X9 LF (GAUZE/BANDAGES/DRESSINGS) ×2 IMPLANT
CHLORAPREP W/TINT 26ML (MISCELLANEOUS) ×2 IMPLANT
CLOTH BEACON ORANGE TIMEOUT ST (SAFETY) ×2 IMPLANT
CORDS BIPOLAR (ELECTRODE) ×2 IMPLANT
COVER MAYO STAND STRL (DRAPES) ×2 IMPLANT
COVER TABLE BACK 60X90 (DRAPES) ×2 IMPLANT
CUFF TOURNIQUET SINGLE 18IN (TOURNIQUET CUFF) ×1 IMPLANT
DECANTER SPIKE VIAL GLASS SM (MISCELLANEOUS) IMPLANT
DRAPE EXTREMITY T 121X128X90 (DRAPE) ×2 IMPLANT
DRAPE OEC MINIVIEW 54X84 (DRAPES) ×2 IMPLANT
DRAPE SURG 17X23 STRL (DRAPES) ×2 IMPLANT
GAUZE XEROFORM 1X8 LF (GAUZE/BANDAGES/DRESSINGS) ×2 IMPLANT
GLOVE BIO SURGEON STRL SZ 6.5 (GLOVE) ×2 IMPLANT
GLOVE BIOGEL PI IND STRL 8.5 (GLOVE) ×1 IMPLANT
GLOVE BIOGEL PI INDICATOR 8.5 (GLOVE) ×1
GLOVE ECLIPSE 6.5 STRL STRAW (GLOVE) ×1 IMPLANT
GLOVE SURG ORTHO 8.0 STRL STRW (GLOVE) ×2 IMPLANT
GOWN BRE IMP PREV XXLGXLNG (GOWN DISPOSABLE) ×2 IMPLANT
GOWN PREVENTION PLUS XLARGE (GOWN DISPOSABLE) ×1 IMPLANT
NEEDLE 27GAX1X1/2 (NEEDLE) ×1 IMPLANT
NS IRRIG 1000ML POUR BTL (IV SOLUTION) ×2 IMPLANT
PACK BASIN DAY SURGERY FS (CUSTOM PROCEDURE TRAY) ×2 IMPLANT
PAD CAST 3X4 CTTN HI CHSV (CAST SUPPLIES) ×1 IMPLANT
PADDING CAST ABS 3INX4YD NS (CAST SUPPLIES) ×1
PADDING CAST ABS 4INX4YD NS (CAST SUPPLIES) ×1
PADDING CAST ABS COTTON 3X4 (CAST SUPPLIES) IMPLANT
PADDING CAST ABS COTTON 4X4 ST (CAST SUPPLIES) ×1 IMPLANT
PADDING CAST COTTON 3X4 STRL (CAST SUPPLIES) ×2
PLATE H EXTENDED LEFT 1.5MM (Plate) ×1 IMPLANT
PROS DRILL BIT QC 1.1MM (BIT) ×2
SCREW CORTEX 1.5X10 (Screw) ×3 IMPLANT
SCREW CORTEX 1.5X9 (Screw) ×5 IMPLANT
SLEEVE SCD COMPRESS KNEE MED (MISCELLANEOUS) ×1 IMPLANT
SPLINT PLASTER CAST XFAST 3X15 (CAST SUPPLIES) IMPLANT
SPLINT PLASTER XTRA FASTSET 3X (CAST SUPPLIES) ×1
SPONGE GAUZE 4X4 12PLY (GAUZE/BANDAGES/DRESSINGS) ×2 IMPLANT
STOCKINETTE 4X48 STRL (DRAPES) ×2 IMPLANT
SUT CHROMIC 5 0 P 3 (SUTURE) IMPLANT
SUT ETHILON 4 0 PS 2 18 (SUTURE) ×1 IMPLANT
SUT MERSILENE 4 0 P 3 (SUTURE) IMPLANT
SUT VICRYL 4-0 PS2 18IN ABS (SUTURE) ×1 IMPLANT
SUT VICRYL RAPID 5 0 P 3 (SUTURE) IMPLANT
SUT VICRYL RAPIDE 4/0 PS 2 (SUTURE) ×2 IMPLANT
SYR BULB 3OZ (MISCELLANEOUS) ×2 IMPLANT
SYR CONTROL 10ML LL (SYRINGE) ×1 IMPLANT
TOWEL OR 17X24 6PK STRL BLUE (TOWEL DISPOSABLE) ×2 IMPLANT
UNDERPAD 30X30 INCONTINENT (UNDERPADS AND DIAPERS) ×2 IMPLANT
WATER STERILE IRR 1000ML POUR (IV SOLUTION) ×2 IMPLANT

## 2012-02-14 NOTE — H&P (Signed)
Kylie Tucker is 61 yo and not been seen in a year and a half.  She suffered a fall.   She is left-handed.  She complains of pain in her right hand and was seen at Urgent Care.  She was placed in a splint after x-rays revealed fracture of her 5th metacarpal.  She is complaining of moderate discomfort, occasional numbness to the finger.    ALLERGIES:   Codeine, Tylenol, Keflex, sulfa, Zofran, Biaxin and Vicodin.  MEDICATIONS:   Arimidex, Effexor, Zetia, Xyzal, Clarinex and Nasonex.  SURGICAL HISTORY:  Cellulitis right arm, breast surgery, axillary node dissection, sinus surgery.  FAMILY MEDICAL HISTORY:  Positive for diabetes, heart disease, high blood pressure and arthritis.  SOCIAL HISTORY:   She does not smoke or drink. 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  REVIEW OF SYSTEMS:   Positive for breast cancer, glasses, ringing in her ears, headaches, otherwise negative. Kylie Tucker is an 61 y.o. female.   Chief Complaint: fracture metacarpal rt little HPI:  See above  Past Medical History  Diagnosis Date  . Cellulitis and abscess of upper arm and forearm   . Postmastectomy lymphedema rt side  . History of right mastectomy 2004    chemo/ radiation  . Migraines   . Cancer rt breast    2004/ chemo/ radiation  . Allergy   . Hyperlipidemia   . PONV (postoperative nausea and vomiting)     cannot take zofran  . GERD (gastroesophageal reflux disease)     Past Surgical History  Procedure Date  . Rt mastectomy 2004    lymph nodes-7  . Nasal sinus surgery 1990/2001  . Breast lumpectomy     right  . Mandible surgery 1991  . Port-a-cath removal     in and now out  . Nasal sinus surgery   . Mouth surgery     Family History  Problem Relation Age of Onset  . Heart disease Father   . Cancer Maternal Aunt     breast  . Heart disease Paternal Uncle   . Heart disease Paternal Grandmother   . Heart disease Paternal Grandfather    Social History:  reports that she  has never smoked. She does not have any smokeless tobacco history on file. She reports that she does not drink alcohol or use illicit drugs.  Allergies:  Allergies  Allergen Reactions  . Acetaminophen   . Amoxicillin-Pot Clavulanate   . Cefuroxime Axetil   . Cephalexin   . Clarithromycin   . Codeine   . Hydrocodone-Acetaminophen   . Levofloxacin   . Morphine And Related   . Ondansetron   . Propoxyphene-Acetaminophen   . Rosuvastatin     REACTION: myalgia  . Sulfonamide Derivatives   . Tramadol Nausea And Vomiting  . Vioxx (Rofecoxib)   . Zofran (Ondansetron Hcl) Nausea And Vomiting    No prescriptions prior to admission    No results found for this or any previous visit (from the past 48 hour(s)).  No results found.   Pertinent items are noted in HPI.  Height 5\' 2"  (1.575 m), weight 68.04 kg (150 lb).  General appearance: alert, cooperative and appears stated age Head: Normocephalic, without obvious abnormality Neck: no adenopathy Resp: clear to auscultation bilaterally Cardio: regular rate and rhythm, S1, S2 normal, no murmur, click, rub or gallop GI: soft, non-tender; bowel sounds normal; no masses,  no organomegaly Extremities: extremities normal, atraumatic, no cyanosis or edema Pulses: 2+ and symmetric Skin: Skin  color, texture, turgor normal. No rashes or lesions Neurologic: Grossly normal Incision/Wound: na  Assessment/Plan RADIOGRAPHS:    X-rays reveal  a long, oblique fracture with displacement of 5th metacarpal shaft, right hand.  DIAGNOSIS:  5th metacarpal fracture right hand.  This is going to require ORIF due to displacement, rotation.   She is aware of risks and complications including infection, recurrence, increase in her lymphedema.   Kydan Shanholtzer R 02/14/2012, 1:00 PM

## 2012-02-14 NOTE — Op Note (Signed)
Dictated number: [id number]

## 2012-02-14 NOTE — Anesthesia Postprocedure Evaluation (Signed)
  Anesthesia Post-op Note  Patient: Kylie Tucker  Procedure(s) Performed: Procedure(s) (LRB): OPEN REDUCTION INTERNAL FIXATION (ORIF) METACARPAL (FINGER) FRACTURE (Right)  Patient Location: PACU  Anesthesia Type: General  Level of Consciousness: awake and alert   Airway and Oxygen Therapy: Patient Spontanous Breathing  Post-op Pain: mild  Post-op Assessment: Post-op Vital signs reviewed, Patient's Cardiovascular Status Stable, Respiratory Function Stable, Patent Airway, No signs of Nausea or vomiting, Adequate PO intake and Pain level controlled  Post-op Vital Signs: stable  Complications: No apparent anesthesia complications

## 2012-02-14 NOTE — Transfer of Care (Signed)
Immediate Anesthesia Transfer of Care Note  Patient: Kylie Tucker  Procedure(s) Performed: Procedure(s) (LRB): OPEN REDUCTION INTERNAL FIXATION (ORIF) METACARPAL (FINGER) FRACTURE (Right)  Patient Location: PACU  Anesthesia Type: General  Level of Consciousness: sedated and patient cooperative  Airway & Oxygen Therapy: Patient Spontanous Breathing and Patient connected to face mask oxygen  Post-op Assessment: Report given to PACU RN and Post -op Vital signs reviewed and stable  Post vital signs: Reviewed and stable  Complications: No apparent anesthesia complications

## 2012-02-14 NOTE — Brief Op Note (Signed)
02/14/2012  3:54 PM  PATIENT:  Kylie Tucker  61 y.o. female  PRE-OPERATIVE DIAGNOSIS:  FRACTURE RIGHT FIFTH METACARPAL  POST-OPERATIVE DIAGNOSIS:  same as preop  PROCEDURE:  Procedure(s) (LRB): OPEN REDUCTION INTERNAL FIXATION (ORIF) METACARPAL (FINGER) FRACTURE (Right)  SURGEON:  Surgeon(s) and Role:    * Nicki Reaper, MD - Primary  PHYSICIAN ASSISTANT:   ASSISTANTS: none   ANESTHESIA:   regional and general  EBL:  Total I/O In: 1000 [I.V.:1000] Out: -   BLOOD ADMINISTERED:none  DRAINS: none   LOCAL MEDICATIONS USED:  MARCAINE     SPECIMEN:  No Specimen  DISPOSITION OF SPECIMEN:  N/A  COUNTS:  YES  TOURNIQUET:   Total Tourniquet Time Documented: Upper Arm (Right) - 39 minutes  DICTATION: .Other Dictation: Dictation Number [id number]  PLAN OF CARE: Discharge to home after PACU  PATIENT DISPOSITION:  PACU - hemodynamically stable.

## 2012-02-14 NOTE — Anesthesia Preprocedure Evaluation (Signed)
Anesthesia Evaluation  Patient identified by MRN, date of birth, ID band Patient awake    Reviewed: Allergy & Precautions, H&P , NPO status , Patient's Chart, lab work & pertinent test results, reviewed documented beta blocker date and time   History of Anesthesia Complications (+) PONV  Airway Mallampati: II TM Distance: >3 FB Neck ROM: full    Dental   Pulmonary shortness of breath and with exertion,  breath sounds clear to auscultation        Cardiovascular negative cardio ROS  Rhythm:regular     Neuro/Psych  Headaches, negative psych ROS   GI/Hepatic Neg liver ROS, GERD-  Medicated and Controlled,  Endo/Other  negative endocrine ROS  Renal/GU negative Renal ROS  negative genitourinary   Musculoskeletal   Abdominal   Peds  Hematology negative hematology ROS (+)   Anesthesia Other Findings See surgeon's H&P   Patient refuses regional anesthesia in operative arm due to lymphedema history  Reproductive/Obstetrics negative OB ROS                           Anesthesia Physical Anesthesia Plan  ASA: II  Anesthesia Plan: General   Post-op Pain Management:    Induction: Intravenous  Airway Management Planned: LMA  Additional Equipment:   Intra-op Plan:   Post-operative Plan: Extubation in OR  Informed Consent: I have reviewed the patients History and Physical, chart, labs and discussed the procedure including the risks, benefits and alternatives for the proposed anesthesia with the patient or authorized representative who has indicated his/her understanding and acceptance.   Dental Advisory Given  Plan Discussed with: CRNA and Surgeon  Anesthesia Plan Comments:         Anesthesia Quick Evaluation

## 2012-02-15 ENCOUNTER — Encounter (HOSPITAL_BASED_OUTPATIENT_CLINIC_OR_DEPARTMENT_OTHER): Payer: Self-pay | Admitting: Orthopedic Surgery

## 2012-02-15 LAB — POCT HEMOGLOBIN-HEMACUE: Hemoglobin: 14.4 g/dL (ref 12.0–15.0)

## 2012-02-15 NOTE — Addendum Note (Signed)
Addendum  created 02/15/12 1003 by Cataract Jordain Radin, CRNA   Modules edited:Anesthesia Responsible Staff    

## 2012-02-15 NOTE — Op Note (Signed)
Kylie Tucker, Kylie Tucker              ACCOUNT NO.:  [id number]  MEDICAL RECORD NO.:  [id number]  LOCATION:                                 FACILITY:  PHYSICIAN:  Cindee Salt, M.D.       DATE OF BIRTH:  1950-09-09  DATE OF PROCEDURE:  02/14/2012 DATE OF DISCHARGE:                              OPERATIVE REPORT   PREOPERATIVE DIAGNOSIS:  Fractured fifth metacarpal, right hand.  POSTOPERATIVE DIAGNOSIS:  Fractured fifth metacarpal, right hand.  OPERATION:  Open reduction and internal fixation fifth metacarpal right hand.  SURGEON:  Cindee Salt, M.D.  ANESTHESIA:  General with wrist block.  ANESTHESIOLOGIST:  Janetta Hora. Gelene Mink, M.D.  HISTORY:  The patient is a 61 year old female with a history of a fall, fracture of her fifth metacarpal, right hand, this is short oblique in nature with displacement.  She is admitted for open reduction and internal fixation.  Is aware of risks and complications including infection, recurrence of injury to arteries, nerves, tendons, incomplete relief of symptoms, dystrophy.  In the preoperative area, the patient is seen, the extremity marked by both the patient and surgeon.  Antibiotic given.  PROCEDURE:  The patient was brought to the operating room where general anesthetic was carried out without difficulty.  She was prepped using ChloraPrep, supine position, right arm free.  A 3-minute dry time was allowed.  Time-out taken, confirming the patient and procedure.  The limb was exsanguinated with an Esmarch bandage.  Tourniquet placed high on the upper arm was inflated to 250 mmHg.  A straight incision was made over the fifth metacarpal, laterally carried down through subcutaneous tissue.  Bleeders were electrocauterized with bipolar.  Dissection carried down volar to the extensor tendons.  The periosteum incised. This was moderately thickened secondary to her lymphedema.  The periosteum elevated.  A comminuted fracture was immediately  apparent, relatively short oblique in nature.  This was able to be manipulated and reduced after removal of the granulation tissue.  This was just underneath the articular surface.  A modular hand 1.5 mm set was selected, the 8-hole honeycomb plate was then placed.  This allowed the screws to be placed over the oblique nature of the fracture with engagement of approximately 4 screws distally, 4 screws proximally into the fractured fragment distally.  This was reduced, the plate was contoured and applied with 9 and 10 mm screws.  This fixed the fracture in position.  AP and lateral x-rays reveal it was reduced.  The finger was placed through full flexion and full extension with no rotatory deformity.  The wound was copiously irrigated with saline.  The periosteum was then closed with figure-of-eight 4-0 Vicryl sutures, subcutaneous tissue with interrupted 4-0 Vicryl, and the skin with interrupted 5-0 nylon sutures.  A sterile compressive dressing, ulnar gutter splint dorsal palmar in nature was applied.  On deflation of the tourniquet, all fingers immediately pinked.  Prior to placement of the dressing, a local infiltration with 0.25% Marcaine was given to the area of the incision and a wrist block to the ulnar nerve given including the dorsal sensory branch with 0.25% Marcaine, approximately 7 mL was used.  On deflation of the  tourniquet, all fingers immediately pinked.  She was taken to the recovery room for observation.          ______________________________ Cindee Salt, M.D.     GK/MEDQ  D:  02/14/2012  T:  02/15/2012  Job:  [id number]

## 2012-02-15 NOTE — Addendum Note (Signed)
Addendum  created 02/15/12 1003 by Lance Coon, CRNA   Modules edited:Anesthesia Responsible Staff

## 2012-02-23 ENCOUNTER — Other Ambulatory Visit: Payer: Self-pay | Admitting: Oncology

## 2012-02-23 DIAGNOSIS — Z1231 Encounter for screening mammogram for malignant neoplasm of breast: Secondary | ICD-10-CM

## 2012-03-16 ENCOUNTER — Ambulatory Visit
Admission: RE | Admit: 2012-03-16 | Discharge: 2012-03-16 | Disposition: A | Discharge status: Home / Self Care | Payer: 59 | Source: Ambulatory Visit | Attending: Oncology | Admitting: Oncology

## 2012-03-16 DIAGNOSIS — Z1231 Encounter for screening mammogram for malignant neoplasm of breast: Secondary | ICD-10-CM

## 2012-03-20 ENCOUNTER — Other Ambulatory Visit: Payer: Self-pay | Admitting: Oral Surgery

## 2012-03-20 DIAGNOSIS — M26629 Arthralgia of temporomandibular joint, unspecified side: Secondary | ICD-10-CM

## 2012-03-22 ENCOUNTER — Ambulatory Visit
Admission: RE | Admit: 2012-03-22 | Discharge: 2012-03-22 | Disposition: A | Discharge status: Home / Self Care | Payer: 59 | Source: Ambulatory Visit | Attending: Oral Surgery | Admitting: Oral Surgery

## 2012-03-22 DIAGNOSIS — M26629 Arthralgia of temporomandibular joint, unspecified side: Secondary | ICD-10-CM

## 2012-04-06 ENCOUNTER — Telehealth: Payer: Self-pay | Admitting: Oncology

## 2012-04-06 NOTE — Telephone Encounter (Signed)
lmonvm adviisng the pt that her sept appts have been cancelled and r/s to November due to a change in the md's schedule.

## 2012-04-10 ENCOUNTER — Other Ambulatory Visit: Payer: 59

## 2012-04-10 ENCOUNTER — Ambulatory Visit: Payer: 59 | Admitting: Oncology

## 2012-04-19 ENCOUNTER — Other Ambulatory Visit (HOSPITAL_COMMUNITY): Payer: Self-pay | Admitting: Physician Assistant

## 2012-04-19 DIAGNOSIS — R6889 Other general symptoms and signs: Secondary | ICD-10-CM

## 2012-04-20 ENCOUNTER — Ambulatory Visit (HOSPITAL_COMMUNITY)
Admission: RE | Admit: 2012-04-20 | Discharge: 2012-04-20 | Disposition: A | Discharge status: Home / Self Care | Payer: 59 | Source: Ambulatory Visit | Attending: Physician Assistant | Admitting: Physician Assistant

## 2012-04-20 DIAGNOSIS — R6889 Other general symptoms and signs: Secondary | ICD-10-CM

## 2012-04-21 ENCOUNTER — Other Ambulatory Visit (HOSPITAL_COMMUNITY): Payer: Self-pay | Admitting: Physician Assistant

## 2012-04-21 DIAGNOSIS — R51 Headache: Secondary | ICD-10-CM

## 2012-04-24 ENCOUNTER — Ambulatory Visit (HOSPITAL_COMMUNITY)
Admission: RE | Admit: 2012-04-24 | Discharge: 2012-04-24 | Disposition: A | Discharge status: Home / Self Care | Payer: 59 | Source: Ambulatory Visit | Attending: Physician Assistant | Admitting: Physician Assistant

## 2012-04-24 DIAGNOSIS — H9319 Tinnitus, unspecified ear: Secondary | ICD-10-CM | POA: Insufficient documentation

## 2012-04-24 DIAGNOSIS — R51 Headache: Secondary | ICD-10-CM | POA: Insufficient documentation

## 2012-04-25 DIAGNOSIS — H01009 Unspecified blepharitis unspecified eye, unspecified eyelid: Secondary | ICD-10-CM | POA: Insufficient documentation

## 2012-04-25 DIAGNOSIS — H169 Unspecified keratitis: Secondary | ICD-10-CM | POA: Insufficient documentation

## 2012-04-25 DIAGNOSIS — E349 Endocrine disorder, unspecified: Secondary | ICD-10-CM | POA: Insufficient documentation

## 2012-06-01 ENCOUNTER — Other Ambulatory Visit (HOSPITAL_BASED_OUTPATIENT_CLINIC_OR_DEPARTMENT_OTHER): Payer: 59 | Admitting: Lab

## 2012-06-01 ENCOUNTER — Ambulatory Visit (HOSPITAL_BASED_OUTPATIENT_CLINIC_OR_DEPARTMENT_OTHER): Payer: 59 | Admitting: Oncology

## 2012-06-01 ENCOUNTER — Telehealth: Payer: Self-pay | Admitting: *Deleted

## 2012-06-01 VITALS — BP 129/80 | HR 80 | Temp 98.7°F | Resp 20 | Ht 62.0 in | Wt 150.9 lb

## 2012-06-01 DIAGNOSIS — C50919 Malignant neoplasm of unspecified site of unspecified female breast: Secondary | ICD-10-CM

## 2012-06-01 DIAGNOSIS — Z9011 Acquired absence of right breast and nipple: Secondary | ICD-10-CM

## 2012-06-01 DIAGNOSIS — Z853 Personal history of malignant neoplasm of breast: Secondary | ICD-10-CM

## 2012-06-01 LAB — COMPREHENSIVE METABOLIC PANEL (CC13)
ALT: 18 U/L (ref 0–55)
AST: 17 U/L (ref 5–34)
Albumin: 3.7 g/dL (ref 3.5–5.0)
BUN: 16 mg/dL (ref 7.0–26.0)
CO2: 29 mEq/L (ref 22–29)
Calcium: 9.7 mg/dL (ref 8.4–10.4)
Chloride: 106 mEq/L (ref 98–107)
Potassium: 4.4 mEq/L (ref 3.5–5.1)

## 2012-06-01 LAB — CBC WITH DIFFERENTIAL/PLATELET
BASO%: 1.1 % (ref 0.0–2.0)
Basophils Absolute: 0.1 10*3/uL (ref 0.0–0.1)
EOS%: 2.8 % (ref 0.0–7.0)
HCT: 39.4 % (ref 34.8–46.6)
HGB: 13.8 g/dL (ref 11.6–15.9)
MCH: 30.9 pg (ref 25.1–34.0)
MONO#: 0.6 10*3/uL (ref 0.1–0.9)
NEUT#: 4.5 10*3/uL (ref 1.5–6.5)
RDW: 13.6 % (ref 11.2–14.5)
WBC: 7.9 10*3/uL (ref 3.9–10.3)
lymph#: 2.6 10*3/uL (ref 0.9–3.3)

## 2012-06-01 NOTE — Progress Notes (Signed)
ID: Kylie Tucker   DOB: Mar 28, 1951  MR#: [id number]  WUJ#:[id number]  PCP: Kirk Ruths, MD GYN:  SU:  OTHER MD: Cindee Salt   HISTORY OF PRESENT ILLNESS: The patient palpated a lump in the lower outer quadrant of the right breast.  Mammography and ultrasound were performed on 08-31-02.  Mammogram was unremarkable, just showing dense breast tissue.  Ultrasound showed a single 9 mm. lesion at the 9:00 position, 5 cm. from the nipple, as well as several adjacent simple cysts.  Core biopsy of the lesion confirmed an invasive mammary carcinoma.  She also had bilateral breast MRI performed on 09-10-02, and this showed multiple enhancing parenchymal nodules bilaterally.  On 09-17-02, Dr. Ovidio Kin performed an excisional biopsy of the right breast mass as well as sentinel lymph node biopsy followed by completion axillary dissection.  The lumpectomy specimen contained multifocal invasive mammary carcinoma, at least three separate foci, measuring 1.2, 0.9 and 0.6 cm.  Invasive tumor came within 0.5 mm. of the caudal margin and 1 mm. of the cephalic margin.  The excisional biopsy specimen grossly measured 8.8 x 4.8 x 3.4 cm.  The tumor was tubulolobular.  The tumor was associated with lymphovascular invasion.  A total of two out of three sentinel lymph nodes were involved with metastatic adenocarcinoma, and from axillary dissection, one of four lymph nodes were involved, none within the extracapsular extension.  However, there was also an intramammary lymph node involved, for a total of four out of eight positive lymph nodes.  The tumor was ER positive at 47% and PR positive at 17%, had an elevation proliferation marker of 44%, and was HER-2/neu 3+ positive. Her subsequent history is as detailed below  INTERVAL HISTORY: Kylie Tucker returns for followup of her breast cancer history. Since her last visit here she fell and fractured her right hand. This is healing but has kept her out of work.  REVIEW OF  SYSTEMS: She complains of hearing loss, mostly ringing and buzzing in both years. She has some chronic sinus problems. She can be short of breath when climbing stairs, but is able to get all the way to the top without stopping. She has pain in both hips, particularly when she walks long distances. She has occasional headaches. Hot flashes continue to be a problem. She has severe migraines and is receiving "trigger point injections" for this. A detailed review of systems today was otherwise noncontributory  PAST MEDICAL HISTORY: Past Medical History  Diagnosis Date  . Cellulitis and abscess of upper arm and forearm   . Postmastectomy lymphedema rt side  . History of right mastectomy 2004    chemo/ radiation  . Migraines   . Cancer rt breast    2004/ chemo/ radiation  . Allergy   . Hyperlipidemia   . PONV (postoperative nausea and vomiting)     cannot take zofran  . GERD (gastroesophageal reflux disease)   She had some type of RT (?orthovoltage vs Cobalt) at [facility] in the late 70's to her lt. lower ribs to treat traumatic injury there, approx. 12 txs. No records available, no tattoos were left. Otherwise healthy except for some GE reflux.  She has undergone excision of breast cysts on the right in the 1980s and on the left in 2002.    PAST SURGICAL HISTORY: Past Surgical History  Procedure Date  . Rt mastectomy 2004    lymph nodes-7  . Nasal sinus surgery 1990/2001  . Breast lumpectomy     right  .  Mandible surgery 1991  . Port-a-cath removal     in and now out  . Nasal sinus surgery   . Mouth surgery   . Orif finger fracture 02/14/2012    Procedure: OPEN REDUCTION INTERNAL FIXATION (ORIF) METACARPAL (FINGER) FRACTURE;  Surgeon: Nicki Reaper, MD;  Location: [facility];  Service: Orthopedics;  Laterality: Right;  RIGHT FIFTH     FAMILY HISTORY Family History  Problem Relation Age of Onset  . Heart disease Father   . Cancer Maternal Aunt     breast  . Heart disease  Paternal Uncle   . Heart disease Paternal Grandmother   . Heart disease Paternal Grandfather   Her mother had rectal cancer, negative for breast or ovarian cancer.  GYNECOLOGIC HISTORY: GX P2   SOCIAL HISTORY: Married, has two children, works at [employer]	[place]  in [place],   ADVANCED DIRECTIVES: Not in place  HEALTH MAINTENANCE: History  Substance Use Topics  . Smoking status: Never Smoker   . Smokeless tobacco: Not on file  . Alcohol Use: No     Colonoscopy:  PAP:  Bone density:  Lipid panel:  Allergies  Allergen Reactions  . Acetaminophen   . Amoxicillin-Pot Clavulanate   . Cefuroxime Axetil   . Cephalexin   . Clarithromycin   . Codeine   . Hydrocodone-Acetaminophen   . Levofloxacin   . Morphine And Related   . Ondansetron   . Propoxyphene-Acetaminophen   . Rosuvastatin     REACTION: myalgia  . Sulfonamide Derivatives   . Tramadol Nausea And Vomiting  . Vioxx (Rofecoxib)   . Zofran (Ondansetron Hcl) Nausea And Vomiting    Current Outpatient Prescriptions  Medication Sig Dispense Refill  . ALPRAZolam (XANAX XR) 0.5 MG 24 hr tablet Take 0.5 mg by mouth 2 (two) times daily as needed. For anxiety       . betamethasone valerate (VALISONE) 0.1 % cream Apply 1 application topically 2 (two) times daily.      . calcium carbonate (OS-CAL) 600 MG TABS Take 600 mg by mouth 2 (two) times daily with a meal.        . Cholecalciferol (VITAMIN D) 2000 UNITS CAPS Take 1 capsule by mouth daily.       . cyclobenzaprine (FLEXERIL) 10 MG tablet Take 10 mg by mouth daily as needed. For pain      . ezetimibe (ZETIA) 10 MG tablet Take 10 mg by mouth daily.        . fluticasone (FLONASE) 50 MCG/ACT nasal spray Place 2 sprays into the nose daily.        . hydrOXYzine (ATARAX/VISTARIL) 25 MG tablet Take 25 mg by mouth every 6 (six) hours as needed. For itching       . levocetirizine (XYZAL) 5 MG tablet Take 5 mg by mouth every evening.        . meloxicam (MOBIC) 7.5 MG tablet  Take 7.5 mg by mouth Ad lib.       . Multiple Vitamins-Minerals (MULTIVITAMINS THER. W/MINERALS) TABS Take 1 tablet by mouth daily.        Marland Kitchen OVER THE COUNTER MEDICATION Apply 2 drops to eye daily. rephresh eye drops. Uses in both eyes      . oxyCODONE (OXY IR/ROXICODONE) 5 MG immediate release tablet Take 1-2 tabs by mouth every 6 hours when necessary pain.  30 tablet  0  . pantoprazole (PROTONIX) 40 MG tablet Take 40 mg by mouth daily.        Marland Kitchen  valACYclovir (VALTREX) 1000 MG tablet Take 1 tablet (1,000 mg total) by mouth 3 (three) times daily.  42 tablet  0  . venlafaxine (EFFEXOR-XR) 37.5 MG 24 hr capsule Take 37.5 mg by mouth 2 (two) times daily.          OBJECTIVE: Middle-aged white woman in no acute distress Filed Vitals:   06/01/12 1438  BP: 129/80  Pulse: 80  Temp: 98.7 F (37.1 C)  Resp: 20     Body mass index is 27.60 kg/(m^2).    ECOG FS: 2  Sclerae unicteric Oropharynx clear No cervical or supraclavicular adenopathy Lungs no rales or rhonchi Heart regular rate and rhythm Abd benign MSK no focal spinal tenderness, no peripheral edema Neuro: nonfocal Breasts: The right breast is status post mastectomy. There is no evidence of local recurrence. The left breast is unremarkable.   LAB RESULTS: Lab Results  Component Value Date   WBC 7.9 06/01/2012   NEUTROABS 4.5 06/01/2012   HGB 13.8 06/01/2012   HCT 39.4 06/01/2012   MCV 88.2 06/01/2012   PLT 275 06/01/2012      Chemistry      Component Value Date/Time   NA 142 06/28/2011 2200   K 3.9 06/28/2011 2200   CL 105 06/28/2011 2200   CO2 31 04/14/2011 0523   BUN 17 06/28/2011 2200   CREATININE 1.00 06/28/2011 2200      Component Value Date/Time   CALCIUM 8.8 04/14/2011 0523   ALKPHOS 99 02/16/2011 1626   AST 21 02/16/2011 1626   ALT 20 02/16/2011 1626   BILITOT 0.2* 02/16/2011 1626       Lab Results  Component Value Date   LABCA2 26 02/16/2011   LABCA2 26 02/16/2011    No components found with this basename:  [id number]    No results found for this basename: INR:1;PROTIME:1 in the last 168 hours  Urinalysis    Component Value Date/Time   COLORURINE YELLOW 10/23/2009 0057   APPEARANCEUR CLOUDY* 10/23/2009 0057   LABSPEC 1.025 06/29/2010 1418   PHURINE 6.0 06/29/2010 1418   GLUCOSEU NEGATIVE 06/29/2010 1418   HGBUR NEGATIVE 06/29/2010 1418   BILIRUBINUR NEGATIVE 06/29/2010 1418   KETONESUR NEGATIVE 06/29/2010 1418   PROTEINUR NEGATIVE 06/29/2010 1418   UROBILINOGEN 0.2 06/29/2010 1418   NITRITE NEGATIVE 06/29/2010 1418   LEUKOCYTESUR NEGATIVE Biochemical Testing Only. Please order routine urinalysis from main lab if confirmatory testing is needed. 06/29/2010 1418    STUDIES: No results found. Mammography 03/16/2012 was unremarkable. MRI/MR of the brain 04/24/2012 showed no metastatic disease  ASSESSMENT: 61 y.o. 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 She took tamoxifen between December of 2004 and July of 2006 at which point she was switched to anastrozole, completing her 5 years of anastrozole in December of 2011  PLAN: I am going to see Kylie Tucker one more time, when she completes her 10 year anniversary from the end of treatment a year from now. In the meantime she knows to call for any problems that may develop before the next visit.   MAGRINAT,GUSTAV C    06/01/2012

## 2012-06-01 NOTE — Telephone Encounter (Signed)
[phone] appointment lab and md

## 2012-06-20 ENCOUNTER — Other Ambulatory Visit: Payer: 59

## 2012-06-26 ENCOUNTER — Ambulatory Visit
Admission: RE | Admit: 2012-06-26 | Discharge: 2012-06-26 | Disposition: A | Discharge status: Home / Self Care | Payer: 59 | Source: Ambulatory Visit | Attending: Oncology | Admitting: Oncology

## 2012-06-26 DIAGNOSIS — Z9011 Acquired absence of right breast and nipple: Secondary | ICD-10-CM

## 2012-08-01 LAB — HM PAP SMEAR: HM Pap smear: NORMAL

## 2012-08-30 ENCOUNTER — Ambulatory Visit (INDEPENDENT_AMBULATORY_CARE_PROVIDER_SITE_OTHER): Payer: 59 | Admitting: Surgery

## 2012-08-30 ENCOUNTER — Encounter (INDEPENDENT_AMBULATORY_CARE_PROVIDER_SITE_OTHER): Payer: Self-pay | Admitting: Surgery

## 2012-08-30 VITALS — BP 126/72 | HR 66 | Temp 98.0°F | Resp 18 | Ht 62.0 in | Wt 143.0 lb

## 2012-08-30 DIAGNOSIS — Z9010 Acquired absence of unspecified breast and nipple: Secondary | ICD-10-CM

## 2012-08-30 NOTE — Progress Notes (Addendum)
[facility]  Kylie Kin, MD,  FACS [address] [place]  [address] [place], [place]    [address] Phone:  [phone] FAX:  [phone]   Re:   Kylie Tucker (was Kylie Tucker) DOB:   1950-07-28 MRN:   [id number]  ASSESSMENT AND PLAN: 1.  T1, N1 right breast cancer.  (3/7 nodes positive)  ER - 47%, PR - 17%, Ki67 - 44%, Her2Neu - 3+ positive  Right mastectomy - 11/13/2002.  Treated with 4 cycles doxorubicin/cyclophosphamide followed by paclitaxel x 12, then took anastrazole x 5 years  Sees Dr. Reece Agar. Magrinat.  Disease free.  She is now 10 years out and doing well.  Dr. Tresa Res, her Gyn, checks her breast/chest wall.  I made this her last visit for me.  Return PRN.  2.  Lymphedema right arm.  3.  Left shoulder pain.  Saw Dr. Ranell Patrick who injected her shoulder.  It is better.  HISTORY OF PRESENT ILLNESS: Chief Complaint  Patient presents with  . Routine Post Op    rt breast    Kylie Tucker is a 62 y.o. (DOB: [birth year])  white female who is a patient of MCGOUGH,WILLIAM M, MD and comes to me today for follow up of right breast cancer.  Doing well.  No change in her lymphedema.  The one problem she has had is left should pain.  She has seen Dr. Ranell Patrick, who injected her shoulder.  She is doing better now.  In good spirits.  PHYSICAL EXAM: BP 126/72  Pulse 66  Temp(Src) 98 F (36.7 C)  Resp 18  Ht 5\' 2"  (1.575 m)  Wt 143 lb (64.864 kg)  BMI 26.15 kg/m2  HEENT:  Pupils equal.  Dentition good.  No injury. NECK:  Supple.  No thyroid mass. LYMPH NODES:  No cervical, supraclavicular, or axillary adenopathy. BREASTS -  RIGHT:  Absent.  No palpable mass or nodule.   LEFT:  No palpable mass or nodule.  No nipple discharge. UPPER EXTREMITIES:  Right arm lymphedema.  Has sleeve on. Stable.  Left shoulder pain, better.  She has good ROM of her left shoulder.  DATA REVIEWED: Mammogram 03/16/2012 - negative  Kylie Kin, MD, FACS Office:  [phone]

## 2012-11-28 DIAGNOSIS — H04209 Unspecified epiphora, unspecified lacrimal gland: Secondary | ICD-10-CM | POA: Insufficient documentation

## 2012-11-30 ENCOUNTER — Telehealth (INDEPENDENT_AMBULATORY_CARE_PROVIDER_SITE_OTHER): Payer: Self-pay

## 2012-11-30 ENCOUNTER — Ambulatory Visit
Admission: RE | Admit: 2012-11-30 | Discharge: 2012-11-30 | Disposition: A | Discharge status: Home / Self Care | Payer: 59 | Source: Ambulatory Visit | Attending: Oncology | Admitting: Oncology

## 2012-11-30 ENCOUNTER — Telehealth: Payer: Self-pay | Admitting: *Deleted

## 2012-11-30 ENCOUNTER — Telehealth (INDEPENDENT_AMBULATORY_CARE_PROVIDER_SITE_OTHER): Payer: Self-pay | Admitting: General Surgery

## 2012-11-30 DIAGNOSIS — Z853 Personal history of malignant neoplasm of breast: Secondary | ICD-10-CM

## 2012-11-30 NOTE — Telephone Encounter (Signed)
Pt of Dr. Ezzard Standing had Rt masty in 2004, called today to report spotting in bra on the Lt now.  When she squeezes the Lt nipple, blood is expressed.  Will check with Dr. Ezzard Standing for advice.

## 2012-11-30 NOTE — Telephone Encounter (Signed)
Patient states she has an appointment with her GYN 12/01/12 she will have Ov notes faxed to [place].

## 2012-11-30 NOTE — Telephone Encounter (Signed)
Pt called to this RN to state concern due to onset of noted bloody discharge from left breast. Deyci noted blood today. Denies any other changes in breast including swelling or local pain.  Miquela stated " this really scares me ".  This RN arranged for diagnostic mammo with U/S to be done today at 330pm at the [facility]. Pt made aware of appointment and verbalized appreciation.

## 2012-12-01 ENCOUNTER — Telehealth: Payer: Self-pay | Admitting: Neurology

## 2012-12-01 ENCOUNTER — Other Ambulatory Visit: Payer: Self-pay | Admitting: *Deleted

## 2012-12-01 ENCOUNTER — Ambulatory Visit: Payer: Self-pay | Admitting: Nurse Practitioner

## 2012-12-01 DIAGNOSIS — N6452 Nipple discharge: Secondary | ICD-10-CM

## 2012-12-01 NOTE — Progress Notes (Signed)
This RN spoke with pt per  mammo and u/s yesterday - per MD review this RN scheduled pt for labs to follow up on nipple discharge concerns.

## 2012-12-01 NOTE — Telephone Encounter (Signed)
Patient is having problems of right side neck and  humming in ears(has seen ENT doctor)suggested to f/u with neurologist.  Informed patient that she should f/u first with pcp since there were no notes in ov of being seen the  side neck pain. She wants a referral to see a neurosurgeon and an MRI scheduled.

## 2012-12-08 ENCOUNTER — Other Ambulatory Visit (HOSPITAL_BASED_OUTPATIENT_CLINIC_OR_DEPARTMENT_OTHER): Payer: 59 | Admitting: Lab

## 2012-12-08 DIAGNOSIS — N6459 Other signs and symptoms in breast: Secondary | ICD-10-CM

## 2012-12-08 DIAGNOSIS — N6452 Nipple discharge: Secondary | ICD-10-CM

## 2012-12-08 LAB — CBC WITH DIFFERENTIAL/PLATELET
BASO%: 1.3 % (ref 0.0–2.0)
EOS%: 3.1 % (ref 0.0–7.0)
MCH: 29.6 pg (ref 25.1–34.0)
MCHC: 33.5 g/dL (ref 31.5–36.0)
RDW: 13.6 % (ref 11.2–14.5)
WBC: 6.2 10*3/uL (ref 3.9–10.3)
lymph#: 1.9 10*3/uL (ref 0.9–3.3)

## 2012-12-08 LAB — COMPREHENSIVE METABOLIC PANEL (CC13)
ALT: 19 U/L (ref 0–55)
AST: 18 U/L (ref 5–34)
Albumin: 3.6 g/dL (ref 3.5–5.0)
Alkaline Phosphatase: 75 U/L (ref 40–150)
Potassium: 4 mEq/L (ref 3.5–5.1)
Sodium: 142 mEq/L (ref 136–145)
Total Protein: 6.9 g/dL (ref 6.4–8.3)

## 2012-12-08 LAB — PROLACTIN: Prolactin: 6.2 ng/mL

## 2012-12-09 ENCOUNTER — Other Ambulatory Visit (HOSPITAL_COMMUNITY): Payer: Self-pay | Admitting: Oncology

## 2012-12-19 ENCOUNTER — Telehealth (INDEPENDENT_AMBULATORY_CARE_PROVIDER_SITE_OTHER): Payer: Self-pay

## 2012-12-19 NOTE — Telephone Encounter (Signed)
Follow up call Nipple discharge ; Patient states nipple discharge has disappeared. She is feeling good . Advised her to will call if she has further questions or concerns

## 2013-02-26 ENCOUNTER — Other Ambulatory Visit: Payer: Self-pay

## 2013-02-26 ENCOUNTER — Other Ambulatory Visit (INDEPENDENT_AMBULATORY_CARE_PROVIDER_SITE_OTHER): Payer: Self-pay | Admitting: Surgery

## 2013-02-26 DIAGNOSIS — Z1231 Encounter for screening mammogram for malignant neoplasm of breast: Secondary | ICD-10-CM

## 2013-03-21 ENCOUNTER — Ambulatory Visit: Payer: 59

## 2013-03-21 ENCOUNTER — Ambulatory Visit
Admission: RE | Admit: 2013-03-21 | Discharge: 2013-03-21 | Disposition: A | Discharge status: Home / Self Care | Payer: 59 | Source: Ambulatory Visit

## 2013-03-21 DIAGNOSIS — Z1231 Encounter for screening mammogram for malignant neoplasm of breast: Secondary | ICD-10-CM

## 2013-04-10 ENCOUNTER — Ambulatory Visit: Payer: 59

## 2013-04-19 ENCOUNTER — Other Ambulatory Visit (HOSPITAL_COMMUNITY): Payer: Self-pay | Admitting: Internal Medicine

## 2013-04-19 DIAGNOSIS — IMO0002 Reserved for concepts with insufficient information to code with codable children: Secondary | ICD-10-CM

## 2013-04-23 ENCOUNTER — Ambulatory Visit (HOSPITAL_COMMUNITY)
Admission: RE | Admit: 2013-04-23 | Discharge: 2013-04-23 | Disposition: A | Discharge status: Home / Self Care | Payer: 59 | Source: Ambulatory Visit | Attending: Internal Medicine | Admitting: Internal Medicine

## 2013-04-23 DIAGNOSIS — M545 Low back pain, unspecified: Secondary | ICD-10-CM | POA: Insufficient documentation

## 2013-04-23 DIAGNOSIS — M47817 Spondylosis without myelopathy or radiculopathy, lumbosacral region: Secondary | ICD-10-CM | POA: Insufficient documentation

## 2013-04-23 DIAGNOSIS — M51379 Other intervertebral disc degeneration, lumbosacral region without mention of lumbar back pain or lower extremity pain: Secondary | ICD-10-CM | POA: Insufficient documentation

## 2013-04-23 DIAGNOSIS — IMO0002 Reserved for concepts with insufficient information to code with codable children: Secondary | ICD-10-CM

## 2013-04-23 DIAGNOSIS — M5137 Other intervertebral disc degeneration, lumbosacral region: Secondary | ICD-10-CM | POA: Insufficient documentation

## 2013-04-30 ENCOUNTER — Other Ambulatory Visit: Payer: Self-pay | Admitting: Occupational Medicine

## 2013-04-30 ENCOUNTER — Ambulatory Visit: Payer: Self-pay

## 2013-04-30 DIAGNOSIS — R52 Pain, unspecified: Secondary | ICD-10-CM

## 2013-05-07 ENCOUNTER — Ambulatory Visit
Admission: RE | Admit: 2013-05-07 | Discharge: 2013-05-07 | Disposition: A | Discharge status: Home / Self Care | Payer: Worker's Compensation | Source: Ambulatory Visit | Attending: Emergency Medicine | Admitting: Emergency Medicine

## 2013-05-07 ENCOUNTER — Other Ambulatory Visit: Payer: Self-pay | Admitting: Emergency Medicine

## 2013-05-07 DIAGNOSIS — M25532 Pain in left wrist: Secondary | ICD-10-CM

## 2013-05-07 DIAGNOSIS — M79642 Pain in left hand: Secondary | ICD-10-CM

## 2013-05-08 ENCOUNTER — Other Ambulatory Visit: Payer: Self-pay | Admitting: Emergency Medicine

## 2013-05-08 DIAGNOSIS — M25532 Pain in left wrist: Secondary | ICD-10-CM

## 2013-05-12 ENCOUNTER — Other Ambulatory Visit: Payer: Self-pay

## 2013-05-13 ENCOUNTER — Ambulatory Visit
Admission: RE | Admit: 2013-05-13 | Discharge: 2013-05-13 | Disposition: A | Discharge status: Home / Self Care | Payer: Worker's Compensation | Source: Ambulatory Visit | Attending: Emergency Medicine | Admitting: Emergency Medicine

## 2013-05-13 DIAGNOSIS — M25532 Pain in left wrist: Secondary | ICD-10-CM

## 2013-05-24 ENCOUNTER — Other Ambulatory Visit: Payer: Self-pay | Admitting: Orthopedic Surgery

## 2013-05-28 ENCOUNTER — Encounter (HOSPITAL_BASED_OUTPATIENT_CLINIC_OR_DEPARTMENT_OTHER): Payer: Self-pay | Admitting: *Deleted

## 2013-05-28 NOTE — Progress Notes (Signed)
Pt here 8/13-did well-no labs needed

## 2013-05-29 ENCOUNTER — Telehealth: Payer: Self-pay | Admitting: *Deleted

## 2013-05-29 NOTE — Telephone Encounter (Signed)
Pt lm for me to cancel her appts for 05/31/13 because she's having surgery on 11.19.14...td

## 2013-05-30 ENCOUNTER — Ambulatory Visit (HOSPITAL_BASED_OUTPATIENT_CLINIC_OR_DEPARTMENT_OTHER)
Admission: RE | Admit: 2013-05-30 | Discharge: 2013-05-30 | Disposition: A | Discharge status: Home / Self Care | Payer: Worker's Compensation | Source: Ambulatory Visit | Attending: Orthopedic Surgery | Admitting: Orthopedic Surgery

## 2013-05-30 ENCOUNTER — Encounter (HOSPITAL_BASED_OUTPATIENT_CLINIC_OR_DEPARTMENT_OTHER): Payer: Self-pay | Admitting: Orthopedic Surgery

## 2013-05-30 ENCOUNTER — Encounter (HOSPITAL_BASED_OUTPATIENT_CLINIC_OR_DEPARTMENT_OTHER): Payer: Worker's Compensation | Admitting: Anesthesiology

## 2013-05-30 ENCOUNTER — Encounter (HOSPITAL_BASED_OUTPATIENT_CLINIC_OR_DEPARTMENT_OTHER)
Admission: RE | Disposition: A | Discharge status: Home / Self Care | Payer: Self-pay | Source: Ambulatory Visit | Attending: Orthopedic Surgery

## 2013-05-30 ENCOUNTER — Ambulatory Visit (HOSPITAL_BASED_OUTPATIENT_CLINIC_OR_DEPARTMENT_OTHER): Payer: Worker's Compensation | Admitting: Anesthesiology

## 2013-05-30 DIAGNOSIS — Z9010 Acquired absence of unspecified breast and nipple: Secondary | ICD-10-CM | POA: Insufficient documentation

## 2013-05-30 DIAGNOSIS — E785 Hyperlipidemia, unspecified: Secondary | ICD-10-CM | POA: Insufficient documentation

## 2013-05-30 DIAGNOSIS — Z885 Allergy status to narcotic agent status: Secondary | ICD-10-CM | POA: Insufficient documentation

## 2013-05-30 DIAGNOSIS — M65839 Other synovitis and tenosynovitis, unspecified forearm: Secondary | ICD-10-CM | POA: Insufficient documentation

## 2013-05-30 DIAGNOSIS — K219 Gastro-esophageal reflux disease without esophagitis: Secondary | ICD-10-CM | POA: Insufficient documentation

## 2013-05-30 DIAGNOSIS — Z853 Personal history of malignant neoplasm of breast: Secondary | ICD-10-CM | POA: Insufficient documentation

## 2013-05-30 HISTORY — PX: REPAIR EXTENSOR TENDON: SHX5382

## 2013-05-30 SURGERY — REPAIR, TENDON, EXTENSOR
Anesthesia: General | Site: Wrist | Laterality: Left

## 2013-05-30 MED ORDER — HYDROMORPHONE HCL PF 1 MG/ML IJ SOLN
0.2500 mg | INTRAMUSCULAR | Status: DC | PRN
  Administered 2013-05-30 (×6): 0.25 mg via INTRAVENOUS

## 2013-05-30 MED ORDER — MIDAZOLAM HCL 2 MG/2ML IJ SOLN
INTRAMUSCULAR | Status: AC
  Filled 2013-05-30: qty 2

## 2013-05-30 MED ORDER — PROPOFOL 10 MG/ML IV BOLUS
INTRAVENOUS | Status: DC | PRN
  Administered 2013-05-30: 150 mg via INTRAVENOUS

## 2013-05-30 MED ORDER — VANCOMYCIN HCL IN DEXTROSE 1-5 GM/200ML-% IV SOLN
INTRAVENOUS | Status: AC
  Filled 2013-05-30: qty 200

## 2013-05-30 MED ORDER — CHLORHEXIDINE GLUCONATE 4 % EX LIQD: 60.0000 mL | Freq: Once | CUTANEOUS | Status: DC

## 2013-05-30 MED ORDER — BUPIVACAINE-EPINEPHRINE PF 0.5-1:200000 % IJ SOLN
INTRAMUSCULAR | Status: DC | PRN
  Administered 2013-05-30: 30 mL via PERINEURAL

## 2013-05-30 MED ORDER — ONDANSETRON HCL 4 MG/2ML IJ SOLN: 4.0000 mg | Freq: Once | INTRAMUSCULAR | Status: DC | PRN

## 2013-05-30 MED ORDER — FENTANYL CITRATE 0.05 MG/ML IJ SOLN
INTRAMUSCULAR | Status: AC
  Filled 2013-05-30: qty 6

## 2013-05-30 MED ORDER — EPHEDRINE SULFATE 50 MG/ML IJ SOLN
INTRAMUSCULAR | Status: DC | PRN
  Administered 2013-05-30 (×2): 10 mg via INTRAVENOUS

## 2013-05-30 MED ORDER — FENTANYL CITRATE 0.05 MG/ML IJ SOLN
INTRAMUSCULAR | Status: AC
  Filled 2013-05-30: qty 2

## 2013-05-30 MED ORDER — LACTATED RINGERS IV SOLN
INTRAVENOUS | Status: DC
  Administered 2013-05-30: 12:00:00 via INTRAVENOUS
  Administered 2013-05-30: 20 mL/h via INTRAVENOUS

## 2013-05-30 MED ORDER — FENTANYL CITRATE 0.05 MG/ML IJ SOLN
INTRAMUSCULAR | Status: DC | PRN
  Administered 2013-05-30: 50 ug via INTRAVENOUS

## 2013-05-30 MED ORDER — OXYCODONE-ACETAMINOPHEN 10-325 MG PO TABS: 1.0000 | ORAL_TABLET | ORAL | Status: DC | PRN

## 2013-05-30 MED ORDER — MIDAZOLAM HCL 2 MG/2ML IJ SOLN
1.0000 mg | INTRAMUSCULAR | Status: DC | PRN
  Administered 2013-05-30: 2 mg via INTRAVENOUS

## 2013-05-30 MED ORDER — LIDOCAINE HCL (CARDIAC) 20 MG/ML IV SOLN
INTRAVENOUS | Status: DC | PRN
  Administered 2013-05-30: 60 mg via INTRAVENOUS

## 2013-05-30 MED ORDER — OXYCODONE HCL 5 MG PO TABS: 5.0000 mg | ORAL_TABLET | Freq: Once | ORAL | Status: DC | PRN

## 2013-05-30 MED ORDER — VANCOMYCIN HCL IN DEXTROSE 1-5 GM/200ML-% IV SOLN
1000.0000 mg | INTRAVENOUS | Status: AC
  Administered 2013-05-30: 1000 mg via INTRAVENOUS

## 2013-05-30 MED ORDER — HYDROMORPHONE HCL PF 1 MG/ML IJ SOLN
INTRAMUSCULAR | Status: AC
  Filled 2013-05-30: qty 1

## 2013-05-30 MED ORDER — MIDAZOLAM HCL 5 MG/5ML IJ SOLN
INTRAMUSCULAR | Status: DC | PRN
  Administered 2013-05-30: 1 mg via INTRAVENOUS

## 2013-05-30 MED ORDER — MEPERIDINE HCL 25 MG/ML IJ SOLN: 6.2500 mg | INTRAMUSCULAR | Status: DC | PRN

## 2013-05-30 MED ORDER — OXYCODONE HCL 5 MG/5ML PO SOLN: 5.0000 mg | Freq: Once | ORAL | Status: DC | PRN

## 2013-05-30 MED ORDER — PROMETHAZINE HCL 25 MG/ML IJ SOLN
12.5000 mg | Freq: Once | INTRAMUSCULAR | Status: AC
  Administered 2013-05-30: 12.5 mg via INTRAVENOUS

## 2013-05-30 MED ORDER — BUPIVACAINE HCL (PF) 0.25 % IJ SOLN
INTRAMUSCULAR | Status: AC
  Filled 2013-05-30: qty 30

## 2013-05-30 MED ORDER — FENTANYL CITRATE 0.05 MG/ML IJ SOLN
50.0000 ug | INTRAMUSCULAR | Status: DC | PRN
  Administered 2013-05-30: 100 ug via INTRAVENOUS

## 2013-05-30 MED ORDER — PROMETHAZINE HCL 25 MG/ML IJ SOLN
INTRAMUSCULAR | Status: AC
  Filled 2013-05-30: qty 1

## 2013-05-30 MED ORDER — DEXAMETHASONE SODIUM PHOSPHATE 10 MG/ML IJ SOLN
INTRAMUSCULAR | Status: DC | PRN
  Administered 2013-05-30: 4 mg via INTRAVENOUS

## 2013-05-30 SURGICAL SUPPLY — 80 items
BAG DECANTER FOR FLEXI CONT (MISCELLANEOUS) IMPLANT
BALL CTTN LRG ABS STRL LF (GAUZE/BANDAGES/DRESSINGS)
BANDAGE GAUZE ELAST BULKY 4 IN (GAUZE/BANDAGES/DRESSINGS) ×2 IMPLANT
BLADE MINI RND TIP GREEN BEAV (BLADE) ×1 IMPLANT
BLADE SURG 15 STRL LF DISP TIS (BLADE) ×1 IMPLANT
BLADE SURG 15 STRL SS (BLADE) ×2
BNDG CMPR 9X4 STRL LF SNTH (GAUZE/BANDAGES/DRESSINGS) ×1
BNDG COHESIVE 3X5 TAN STRL LF (GAUZE/BANDAGES/DRESSINGS) ×2 IMPLANT
BNDG ESMARK 4X9 LF (GAUZE/BANDAGES/DRESSINGS) ×1 IMPLANT
CHLORAPREP W/TINT 26ML (MISCELLANEOUS) ×2 IMPLANT
CORDS BIPOLAR (ELECTRODE) ×2 IMPLANT
COTTONBALL LRG STERILE PKG (GAUZE/BANDAGES/DRESSINGS) IMPLANT
COVER MAYO STAND STRL (DRAPES) ×2 IMPLANT
COVER TABLE BACK 60X90 (DRAPES) ×2 IMPLANT
CUFF TOURNIQUET SINGLE 18IN (TOURNIQUET CUFF) ×1 IMPLANT
DECANTER SPIKE VIAL GLASS SM (MISCELLANEOUS) IMPLANT
DRAIN TLS ROUND 10FR (DRAIN) IMPLANT
DRAPE EXTREMITY T 121X128X90 (DRAPE) ×2 IMPLANT
DRAPE OEC MINIVIEW 54X84 (DRAPES) IMPLANT
DRAPE SURG 17X23 STRL (DRAPES) ×2 IMPLANT
DRSG KUZMA FLUFF (GAUZE/BANDAGES/DRESSINGS) IMPLANT
GAUZE SPONGE 4X4 16PLY XRAY LF (GAUZE/BANDAGES/DRESSINGS) IMPLANT
GAUZE XEROFORM 1X8 LF (GAUZE/BANDAGES/DRESSINGS) ×2 IMPLANT
GLOVE BIO SURGEON STRL SZ 6.5 (GLOVE) ×1 IMPLANT
GLOVE BIOGEL PI IND STRL 7.0 (GLOVE) IMPLANT
GLOVE BIOGEL PI IND STRL 8.5 (GLOVE) ×1 IMPLANT
GLOVE BIOGEL PI INDICATOR 7.0 (GLOVE) ×2
GLOVE BIOGEL PI INDICATOR 8.5 (GLOVE) ×1
GLOVE ECLIPSE 6.5 STRL STRAW (GLOVE) ×1 IMPLANT
GLOVE SURG ORTHO 8.0 STRL STRW (GLOVE) ×2 IMPLANT
GOWN BRE IMP PREV XXLGXLNG (GOWN DISPOSABLE) ×2 IMPLANT
GOWN PREVENTION PLUS XLARGE (GOWN DISPOSABLE) ×3 IMPLANT
K-WIRE .035X4 (WIRE) IMPLANT
LOOP VESSEL MAXI BLUE (MISCELLANEOUS) IMPLANT
NDL KEITH (NEEDLE) IMPLANT
NEEDLE 27GAX1X1/2 (NEEDLE) IMPLANT
NEEDLE HYPO 22GX1.5 SAFETY (NEEDLE) IMPLANT
NEEDLE KEITH (NEEDLE) IMPLANT
NS IRRIG 1000ML POUR BTL (IV SOLUTION) ×2 IMPLANT
PACK BASIN DAY SURGERY FS (CUSTOM PROCEDURE TRAY) ×2 IMPLANT
PAD CAST 3X4 CTTN HI CHSV (CAST SUPPLIES) ×1 IMPLANT
PADDING CAST ABS 3INX4YD NS (CAST SUPPLIES)
PADDING CAST ABS 4INX4YD NS (CAST SUPPLIES)
PADDING CAST ABS COTTON 3X4 (CAST SUPPLIES) IMPLANT
PADDING CAST ABS COTTON 4X4 ST (CAST SUPPLIES) ×1 IMPLANT
PADDING CAST COTTON 3X4 STRL (CAST SUPPLIES) ×2
SLEEVE SCD COMPRESS KNEE MED (MISCELLANEOUS) IMPLANT
SLING ARM FOAM STRAP MED (SOFTGOODS) ×1 IMPLANT
SPLINT PLASTER CAST XFAST 3X15 (CAST SUPPLIES) IMPLANT
SPLINT PLASTER XTRA FASTSET 3X (CAST SUPPLIES) ×10
SPONGE GAUZE 4X4 12PLY (GAUZE/BANDAGES/DRESSINGS) ×2 IMPLANT
STOCKINETTE 4X48 STRL (DRAPES) ×2 IMPLANT
SUT CHROMIC 5 0 P 3 (SUTURE) IMPLANT
SUT ETHIBOND 3-0 V-5 (SUTURE) IMPLANT
SUT ETHILON 5 0 PC 1 (SUTURE) ×2 IMPLANT
SUT FIBERWIRE 2-0 18 17.9 3/8 (SUTURE)
SUT FIBERWIRE 4-0 18 TAPR NDL (SUTURE)
SUT MERSILENE 2.0 SH NDLE (SUTURE) IMPLANT
SUT MERSILENE 3 0 FS 1 (SUTURE) IMPLANT
SUT MERSILENE 4 0 P 3 (SUTURE) IMPLANT
SUT POLY BUTTON 15MM (SUTURE) IMPLANT
SUT PROLENE 2 0 SH DA (SUTURE) IMPLANT
SUT SILK 2 0 FS (SUTURE) IMPLANT
SUT SILK 4 0 PS 2 (SUTURE) IMPLANT
SUT STEEL 3 0 (SUTURE) IMPLANT
SUT STEEL 4 0 V 26 (SUTURE) IMPLANT
SUT VIC AB 3-0 PS1 18 (SUTURE)
SUT VIC AB 3-0 PS1 18XBRD (SUTURE) IMPLANT
SUT VIC AB 4-0 P-3 18XBRD (SUTURE) IMPLANT
SUT VIC AB 4-0 P3 18 (SUTURE) ×2
SUT VICRYL 4-0 PS2 18IN ABS (SUTURE) IMPLANT
SUT VICRYL RAPID 5 0 P 3 (SUTURE) IMPLANT
SUT VICRYL RAPIDE 4/0 PS 2 (SUTURE) ×2 IMPLANT
SUTURE FIBERWR 2-0 18 17.9 3/8 (SUTURE) IMPLANT
SUTURE FIBERWR 4-0 18 TAPR NDL (SUTURE) IMPLANT
SYR BULB 3OZ (MISCELLANEOUS) ×2 IMPLANT
SYR CONTROL 10ML LL (SYRINGE) IMPLANT
TOWEL OR 17X24 6PK STRL BLUE (TOWEL DISPOSABLE) ×4 IMPLANT
TUBE FEEDING 5FR 15 INCH (TUBING) IMPLANT
UNDERPAD 30X30 INCONTINENT (UNDERPADS AND DIAPERS) ×1 IMPLANT

## 2013-05-30 NOTE — Anesthesia Preprocedure Evaluation (Signed)
Anesthesia Evaluation  Patient identified by MRN, date of birth, ID band Patient awake    Reviewed: Allergy & Precautions, H&P , NPO status , Patient's Chart, lab work & pertinent test results  History of Anesthesia Complications (+) PONV  Airway Mallampati: I TM Distance: >3 FB Neck ROM: Full    Dental   Pulmonary          Cardiovascular     Neuro/Psych  Headaches,    GI/Hepatic GERD-  Medicated and Controlled,  Endo/Other    Renal/GU      Musculoskeletal   Abdominal   Peds  Hematology   Anesthesia Other Findings   Reproductive/Obstetrics                           Anesthesia Physical Anesthesia Plan  ASA: II  Anesthesia Plan: General   Post-op Pain Management:    Induction: Intravenous  Airway Management Planned: LMA  Additional Equipment:   Intra-op Plan:   Post-operative Plan: Extubation in OR  Informed Consent: I have reviewed the patients History and Physical, chart, labs and discussed the procedure including the risks, benefits and alternatives for the proposed anesthesia with the patient or authorized representative who has indicated his/her understanding and acceptance.     Plan Discussed with: CRNA and Surgeon  Anesthesia Plan Comments:         Anesthesia Quick Evaluation

## 2013-05-30 NOTE — Progress Notes (Signed)
Assisted Dr. Ossey with left, ultrasound guided, supraclavicular block. Side rails up, monitors on throughout procedure. See vital signs in flow sheet. Tolerated Procedure well. 

## 2013-05-30 NOTE — Brief Op Note (Signed)
05/30/2013  1:14 PM  PATIENT:  Kylie Tucker  62 y.o. female  PRE-OPERATIVE DIAGNOSIS:  EXTENSOR POLICIS LONGUS TENDONITIS LEFT  POST-OPERATIVE DIAGNOSIS:  EXTENSOR POLICIS LONGUS TENDONITIS LEFT  PROCEDURE:  Procedure(s): RELEASE TRANSPOSITION EXTENSOR POLLICUS LONGUS LEFT WRIST (Left)  SURGEON:  Surgeon(s) and Role:    * Nicki Reaper, MD - Primary  PHYSICIAN ASSISTANT:   ASSISTANTS: none   ANESTHESIA:   regional and general  EBL:  Total I/O In: 600 [I.V.:600] Out: -   BLOOD ADMINISTERED:none  DRAINS: none   LOCAL MEDICATIONS USED:  NONE  SPECIMEN:  No Specimen  DISPOSITION OF SPECIMEN:  N/A  COUNTS:  YES  TOURNIQUET:  * Missing tourniquet times found for documented tourniquets in log:  [id number] *  DICTATION: .Other Dictation: Dictation Number [id number]  PLAN OF CARE: Discharge to home after PACU  PATIENT DISPOSITION:  PACU - hemodynamically stable.

## 2013-05-30 NOTE — Op Note (Signed)
Dictation Number [id number]

## 2013-05-30 NOTE — Anesthesia Procedure Notes (Addendum)
Anesthesia Regional Block:  Supraclavicular block  Pre-Anesthetic Checklist: ,, timeout performed, Correct Patient, Correct Site, Correct Laterality, Correct Procedure, Correct Position, site marked, Risks and benefits discussed,  Surgical consent,  Pre-op evaluation,  At surgeon's request and post-op pain management  Laterality: Left  Prep: chloraprep       Needles:  Injection technique: Single-shot  Needle Type: Echogenic Stimulator Needle     Needle Length: 5cm 5 cm Needle Gauge: 21 and 21 G    Additional Needles:  Procedures: ultrasound guided (picture in chart) and nerve stimulator Supraclavicular block  Nerve Stimulator or Paresthesia:  Response: 0.4 mA,   Additional Responses:   Narrative:  Start time: 05/30/2013 12:15 PM End time: 05/30/2013 12:30 PM Injection made incrementally with aspirations every 5 mL.  Performed by: Personally  Anesthesiologist: Arta Bruce MD  Additional Notes: Monitors applied. Patient sedated. Sterile prep and drape,hand hygiene and sterile gloves were used. Relevant anatomy identified.Needle position confirmed.Local anesthetic injected incrementally after negative aspiration. Local anesthetic spread visualized around nerve(s). Vascular puncture avoided. No complications. Image printed for medical record.The patient tolerated the procedure well.       Supraclavicular block Procedure Name: LMA Insertion Date/Time: 05/30/2013 12:44 PM Performed by: Cindee Salt R Pre-anesthesia Checklist: Patient identified, Emergency Drugs available, Suction available and Patient being monitored Patient Re-evaluated:Patient Re-evaluated prior to inductionOxygen Delivery Method: Circle System Utilized Preoxygenation: Pre-oxygenation with 100% oxygen Intubation Type: IV induction Ventilation: Mask ventilation without difficulty LMA: LMA inserted LMA Size: 4.0 Number of attempts: 1 Airway Equipment and Method: bite block Placement Confirmation:  positive ETCO2 Tube secured with: Tape Dental Injury: Teeth and Oropharynx as per pre-operative assessment

## 2013-05-30 NOTE — Transfer of Care (Signed)
Immediate Anesthesia Transfer of Care Note  Patient: Kylie Tucker  Procedure(s) Performed: Procedure(s): RELEASE TRANSPOSITION EXTENSOR POLLICUS LONGUS LEFT WRIST (Left)  Patient Location: PACU  Anesthesia Type:GA combined with regional for post-op pain  Level of Consciousness: awake and patient cooperative  Airway & Oxygen Therapy: Patient Spontanous Breathing and Patient connected to face mask oxygen  Post-op Assessment: Report given to PACU RN and Post -op Vital signs reviewed and stable  Post vital signs: Reviewed and stable  Complications: No apparent anesthesia complications

## 2013-05-30 NOTE — H&P (Signed)
Kylie Tucker  is 62. She suffered a fall suffering a fracture of her distal radius nondisplaced left wrist on 04-30-13. She was then placed in a cast. This has been gradually healing but she is now complaining of pain with movement of her thumb and localizes this over the 3rd dorsal compartment area of her wrist.  X-rays reveal the fracture healing nondisplaced.  I have concerns that this is the prelim of a ruptured EPL. Resisted extension produces her pain for her.   PAST MEDICAL HISTORY: She has an allergy to Codeine, sulfa and Vicodin. She is on Arimidex, Effexor, Zetia, Clarinex, Nasonex and Relpax. She has had sinus surgery, breast surgery, dental surgery, and surgery on her right hand.   FAMILY H ISTORY: Positive for diabetes, heart disease, high BP and arthritis.  SOCIAL HISTORY: She does not smoke or drink. She is married and a Location manager.  REVIEW OF SYSTEMS: Positive for breast cancer, glasses, ringing in her ears, headaches, otherwise negative. Kylie Tucker is an 62 y.o. female.   Chief Complaint: Non displaced fracture left wrist epl tendinossis HPI: see above  Past Medical History  Diagnosis Date  . Cellulitis and abscess of upper arm and forearm   . Postmastectomy lymphedema rt side  . History of right mastectomy 2004    chemo/ radiation  . Migraines   . Cancer rt breast    2004/ chemo/ radiation  . Allergy   . Hyperlipidemia   . PONV (postoperative nausea and vomiting)     cannot take zofran  . GERD (gastroesophageal reflux disease)     Past Surgical History  Procedure Laterality Date  . Rt mastectomy  2004    lymph nodes-7-axillary node dissection  . Nasal sinus surgery  1990/2001  . Mandible surgery  1991  . Port-a-cath removal      in and now out  . Nasal sinus surgery    . Mouth surgery    . Orif finger fracture  02/14/2012    Procedure: OPEN REDUCTION INTERNAL FIXATION (ORIF) METACARPAL (FINGER) FRACTURE;  Surgeon: Nicki Reaper, MD;   Location: [facility];  Service: Orthopedics;  Laterality: Right;  RIGHT FIFTH   . Breast lumpectomy  2004     br bx  . Orif metacarpal fracture  8/13    rt     Family History  Problem Relation Age of Onset  . Heart disease Father   . Cancer Maternal Aunt     breast  . Heart disease Paternal Uncle   . Heart disease Paternal Grandmother   . Heart disease Paternal Grandfather    Social History:  reports that she has never smoked. She does not have any smokeless tobacco history on file. She reports that she does not drink alcohol or use illicit drugs.  Allergies:  Allergies  Allergen Reactions  . Acetaminophen   . Amoxicillin-Pot Clavulanate   . Cefuroxime Axetil   . Cephalexin   . Clarithromycin   . Codeine   . Hydrocodone-Acetaminophen   . Levofloxacin   . Morphine And Related   . Ondansetron   . Propoxyphene-Acetaminophen   . Rosuvastatin     REACTION: myalgia  . Sulfonamide Derivatives   . Tramadol Nausea And Vomiting  . Vioxx [Rofecoxib]   . Zofran [Ondansetron Hcl] Nausea And Vomiting    No prescriptions prior to admission    No results found for this or any previous visit (from the past 48 hour(s)).  No results found.   Pertinent  items are noted in HPI.  Height 5\' 2"  (1.575 m), weight 143 lb (64.864 kg).  General appearance: alert, cooperative and appears stated age Head: Normocephalic, without obvious abnormality Neck: no JVD Resp: clear to auscultation bilaterally Cardio: regular rate and rhythm, S1, S2 normal, no murmur, click, rub or gallop GI: soft, non-tender; bowel sounds normal; no masses,  no organomegaly Extremities: extremities normal, atraumatic, no cyanosis or edema Pulses: 2+ and symmetric Skin: Skin color, texture, turgor normal. No rashes or lesions Neurologic: Grossly normal Incision/Wound: na  Assessment/Plan Diagnosis: EPL tendonitis.  We would recommend release of the EPL tendon along with transposition to the  3rd dorsal compartment. This will be scheduled as an outpatient under regional anesthesia next week. She is maintained in her splint.  Marshal Schrecengost R 05/30/2013, 10:55 AM

## 2013-05-30 NOTE — Anesthesia Postprocedure Evaluation (Signed)
Anesthesia Post Note  Patient: Kylie Tucker  Procedure(s) Performed: Procedure(s) (LRB): RELEASE TRANSPOSITION EXTENSOR POLLICUS LONGUS LEFT WRIST (Left)  Anesthesia type: general  Patient location: PACU  Post pain: Pain level controlled  Post assessment: Patient's Cardiovascular Status Stable  Last Vitals:  Filed Vitals:   05/30/13 1530  BP: 154/69  Pulse: 77  Temp:   Resp: 12    Post vital signs: Reviewed and stable  Level of consciousness: sedated  Complications: No apparent anesthesia complications

## 2013-05-31 ENCOUNTER — Ambulatory Visit: Payer: 59 | Admitting: Oncology

## 2013-05-31 ENCOUNTER — Other Ambulatory Visit: Payer: 59 | Admitting: Lab

## 2013-05-31 ENCOUNTER — Encounter (HOSPITAL_BASED_OUTPATIENT_CLINIC_OR_DEPARTMENT_OTHER): Payer: Self-pay | Admitting: Orthopedic Surgery

## 2013-05-31 NOTE — Op Note (Signed)
Kylie Tucker, Kylie Tucker              ACCOUNT NO.:  [id number]  MEDICAL RECORD NO.:  [id number]  LOCATION:  RAD                           FACILITY:  [facility]  PHYSICIAN:  Cindee Salt, M.D.       DATE OF BIRTH:  May 09, 1951  DATE OF PROCEDURE:  05/30/2013 DATE OF DISCHARGE:  04/23/2013                              OPERATIVE REPORT   PREOPERATIVE DIAGNOSIS:  Status post distal radius fracture, left wrist, nondisplaced with extensor pollicis longus tenosynovitis, left wrist.  POSTOPERATIVE DIAGNOSIS:  Status post distal radius fracture, left wrist, nondisplaced with extensor pollicis longus tenosynovitis, left wrist.  OPERATION:  Transposition, tenolysis extensor pollicis longus tendon, left wrist.  SURGEON:  Cindee Salt, M.D.  ANESTHESIA:  Supraclavicular block general.  ANESTHESIOLOGIST:  Kaylyn Layer. Michelle Piper, M.D.  HISTORY:  The patient is a 62 year old female with a history of nondisplaced fracture of her left distal radius.  She has been doing well with conservative treatment, but has complained of pain with extension of her EPL tendon, and tenderness at Lister's tubercle.  She is admitted for tenolysis transposition of left EPL tendon.  She is aware of risks and complications including infection; recurrence of injury to arteries, nerves, tendons, incomplete relief of symptoms, dystrophy, possibility of rupture.  In the preoperative area, the patient was seen.  The extremity marked by both the patient and surgeon. Antibiotic given.  PROCEDURE IN DETAIL:  The patient was brought to the operating room where a supraclavicular block general anesthetic was carried out without difficulty.  She was prepped using ChloraPrep, supine position with the left arm free.  A 3-minute dry time was allowed.  Time-out taken, confirming the patient and the procedure.  The limb was exsanguinated with an Esmarch bandage, taking care to protect the distal radius fracture.  A tourniquet was placed high and the  arm was inflated to 250 mmHg.  A longitudinal incision was made over Lister's tubercle carried down through subcutaneous tissue.  Bleeders were electrocauterized. Sensory nerves were identified and protected.  Retractors placed. Fraying of the EPL tendon was immediately apparent.  The third dorsal compartment was then opened.  Tenosynovitis was present proximally. This was debrided.  The tendon was then removed from Lister's tubercle, brought onto the dorsal aspect.  The retinaculum was then repaired underneath this to maintain it in a transposed position.  The repair was done with 4-0 Vicryl sutures.  The wound was irrigated.  The subcutaneous tissue closed with interrupted 4-0 Vicryl and skin with a subcuticular 4-0 Vicryl Rapide sutures.  A sterile compressive dressing was applied, a splint.  X-rays confirmed no change in position of the fracture.  The patient tolerated the procedure well, and was taken to the recovery room for observation in satisfactory condition.  She will be discharged home to return in 1 week on oxycodone.          ______________________________ Cindee Salt, M.D.     GK/MEDQ  D:  05/30/2013  T:  05/31/2013  Job:  [id number]

## 2013-06-29 ENCOUNTER — Encounter: Payer: Self-pay | Admitting: Obstetrics and Gynecology

## 2013-07-07 ENCOUNTER — Encounter (HOSPITAL_COMMUNITY): Payer: Self-pay | Admitting: Emergency Medicine

## 2013-07-07 ENCOUNTER — Emergency Department (HOSPITAL_COMMUNITY)
Admission: EM | Admit: 2013-07-07 | Discharge: 2013-07-07 | Disposition: A | Discharge status: Home / Self Care | Payer: 59 | Attending: Emergency Medicine | Admitting: Emergency Medicine

## 2013-07-07 DIAGNOSIS — Z9010 Acquired absence of unspecified breast and nipple: Secondary | ICD-10-CM | POA: Insufficient documentation

## 2013-07-07 DIAGNOSIS — J069 Acute upper respiratory infection, unspecified: Secondary | ICD-10-CM | POA: Insufficient documentation

## 2013-07-07 DIAGNOSIS — Z872 Personal history of diseases of the skin and subcutaneous tissue: Secondary | ICD-10-CM | POA: Insufficient documentation

## 2013-07-07 DIAGNOSIS — R05 Cough: Secondary | ICD-10-CM

## 2013-07-07 DIAGNOSIS — Z853 Personal history of malignant neoplasm of breast: Secondary | ICD-10-CM | POA: Insufficient documentation

## 2013-07-07 DIAGNOSIS — IMO0002 Reserved for concepts with insufficient information to code with codable children: Secondary | ICD-10-CM | POA: Insufficient documentation

## 2013-07-07 DIAGNOSIS — Z79899 Other long term (current) drug therapy: Secondary | ICD-10-CM | POA: Insufficient documentation

## 2013-07-07 DIAGNOSIS — H9209 Otalgia, unspecified ear: Secondary | ICD-10-CM | POA: Insufficient documentation

## 2013-07-07 DIAGNOSIS — Z8679 Personal history of other diseases of the circulatory system: Secondary | ICD-10-CM | POA: Insufficient documentation

## 2013-07-07 DIAGNOSIS — K219 Gastro-esophageal reflux disease without esophagitis: Secondary | ICD-10-CM | POA: Insufficient documentation

## 2013-07-07 DIAGNOSIS — G43909 Migraine, unspecified, not intractable, without status migrainosus: Secondary | ICD-10-CM | POA: Insufficient documentation

## 2013-07-07 NOTE — ED Notes (Signed)
Cough, congestion, nasal and head congestion.

## 2013-07-07 NOTE — ED Provider Notes (Signed)
.  aats Medical screening examination/treatment/procedure(s) were performed by non-physician practitioner and as supervising physician I was immediately available for consultation/collaboration.  EKG Interpretation   None         Benny Lennert, MD 07/07/13 2344

## 2013-07-07 NOTE — ED Provider Notes (Signed)
CSN: [id number]     Arrival date & time 07/07/13  2105 History   First MD Initiated Contact with Patient 07/07/13 2154     Chief Complaint  Patient presents with  . Cough  . Nasal Congestion  . Otalgia   (Consider location/radiation/quality/duration/timing/severity/associated sxs/prior Treatment) HPI Comments: Patient presents to the emergency department with chief complaint of cough, and sinus congestion. She states that the symptoms have been ongoing for the past 2-3 weeks. She states that it recently worsened over the past week. She denies any fevers or chills. She states that the sinus pressure is quite painful. She is tried taking Levaquin, doxycycline, and Cipro with no relief. She has tried nasal sprays with no relief. She is tried taking ibuprofen for pain, because she cannot take anything else with some relief. No aggravating or alleviating factors.  The history is provided by the patient. No language interpreter was used.    Past Medical History  Diagnosis Date  . Cellulitis and abscess of upper arm and forearm   . Postmastectomy lymphedema rt side  . History of right mastectomy 2004    chemo/ radiation  . Migraines   . Cancer rt breast    2004/ chemo/ radiation  . Allergy   . Hyperlipidemia   . PONV (postoperative nausea and vomiting)     cannot take zofran  . GERD (gastroesophageal reflux disease)    Past Surgical History  Procedure Laterality Date  . Rt mastectomy  2004    lymph nodes-7-axillary node dissection  . Nasal sinus surgery  1990/2001  . Mandible surgery  1991  . Port-a-cath removal      in and now out  . Nasal sinus surgery    . Mouth surgery    . Orif finger fracture  02/14/2012    Procedure: OPEN REDUCTION INTERNAL FIXATION (ORIF) METACARPAL (FINGER) FRACTURE;  Surgeon: Nicki Reaper, MD;  Location: [facility];  Service: Orthopedics;  Laterality: Right;  RIGHT FIFTH   . Breast lumpectomy  2004     br bx  . Orif metacarpal fracture   8/13    rt   . Repair extensor tendon Left 05/30/2013    Procedure: RELEASE TRANSPOSITION EXTENSOR POLLICUS LONGUS LEFT WRIST;  Surgeon: Nicki Reaper, MD;  Location: [facility];  Service: Orthopedics;  Laterality: Left;   Family History  Problem Relation Age of Onset  . Heart disease Father   . Cancer Maternal Aunt     breast  . Heart disease Paternal Uncle   . Heart disease Paternal Grandmother   . Heart disease Paternal Grandfather    History  Substance Use Topics  . Smoking status: Never Smoker   . Smokeless tobacco: Not on file  . Alcohol Use: No   OB History   Grav Para Term Preterm Abortions TAB SAB Ect Mult Living                 Review of Systems  All other systems reviewed and are negative.    Allergies  Acetaminophen; Amoxicillin-pot clavulanate; Azithromycin; Cefuroxime axetil; Cephalexin; Clarithromycin; Codeine; Hydrocodone-acetaminophen; Levofloxacin; Morphine and related; Ondansetron; Propoxyphene n-acetaminophen; Rosuvastatin; Sulfonamide derivatives; Tramadol; Vioxx; and Zofran  Home Medications   Current Outpatient Rx  Name  Route  Sig  Dispense  Refill  . ALPRAZolam (XANAX XR) 0.5 MG 24 hr tablet   Oral   Take 0.5 mg by mouth 2 (two) times daily as needed. For anxiety          .  benzonatate (TESSALON) 100 MG capsule   Oral   Take 100 mg by mouth 3 (three) times daily as needed for cough.         . calcium carbonate (OS-CAL) 600 MG TABS   Oral   Take 600 mg by mouth 2 (two) times daily with a meal.           . Cholecalciferol (VITAMIN D) 2000 UNITS CAPS   Oral   Take 1 capsule by mouth daily.          . ciprofloxacin (CIPRO) 500 MG tablet   Oral   Take 500 mg by mouth 2 (two) times daily. 10 day course starting on 07/03/2013         . cyclobenzaprine (FLEXERIL) 10 MG tablet   Oral   Take 10 mg by mouth daily as needed. For pain         . eletriptan (RELPAX) 40 MG tablet   Oral   Take 40 mg by mouth as needed  for migraine or headache. One tablet by mouth at onset of headache. May repeat in 2 hours if headache persists or recurs.         Marland Kitchen ezetimibe (ZETIA) 10 MG tablet   Oral   Take 10 mg by mouth at bedtime.          . fluticasone (FLONASE) 50 MCG/ACT nasal spray   Nasal   Place 2 sprays into the nose daily.          Marland Kitchen ketotifen (REFRESH EYE ITCH RELIEF) 0.025 % ophthalmic solution   Both Eyes   Place 1 drop into both eyes 2 (two) times daily.         . Multiple Vitamins-Minerals (MULTIVITAMINS THER. W/MINERALS) TABS   Oral   Take 1 tablet by mouth daily.           . Olopatadine HCl (PATADAY) 0.2 % SOLN   Ophthalmic   Apply 1 drop to eye daily.         . pantoprazole (PROTONIX) 40 MG tablet   Oral   Take 40 mg by mouth daily as needed (for GERD).          Marland Kitchen venlafaxine (EFFEXOR-XR) 37.5 MG 24 hr capsule   Oral   Take 37.5 mg by mouth daily with breakfast.           BP 136/80  Pulse 97  Temp(Src) 98.1 F (36.7 C) (Oral)  Resp 18  Ht 5' 2.5" (1.588 m)  Wt 140 lb (63.504 kg)  BMI 25.18 kg/m2  SpO2 98% Physical Exam  Nursing note and vitals reviewed. Constitutional: She is oriented to person, place, and time. She appears well-developed and well-nourished.  HENT:  Head: Normocephalic and atraumatic.  Right Ear: External ear normal.  Left Ear: External ear normal.  Mouth/Throat: Oropharynx is clear and moist. No oropharyngeal exudate.  Swollen, erythematous turbinates, maxillary sinuses tender to palpation, oropharynx is clear, no tonsillar exudates, no signs of tonsillar or per abscess  Eyes: Conjunctivae and EOM are normal. Pupils are equal, round, and reactive to light.  Neck: Normal range of motion. Neck supple.  Cardiovascular: Normal rate, regular rhythm and normal heart sounds.   Pulmonary/Chest: Effort normal and breath sounds normal. No respiratory distress. She has no wheezes. She has no rales. She exhibits no tenderness.  Abdominal: Soft. Bowel  sounds are normal.  Musculoskeletal: Normal range of motion.  Neurological: She is alert and oriented to person, place, and time.  Skin:  Skin is warm and dry.  Psychiatric: She has a normal mood and affect. Her behavior is normal. Judgment and thought content normal.    ED Course  Procedures (including critical care time) Labs Review Labs Reviewed - No data to display Imaging Review No results found.  EKG Interpretation   None       MDM   1. Cough   2. URI (upper respiratory infection)     Patients symptoms are consistent with URI, likely viral etiology. Discussed that antibiotics are not indicated for viral infections. Pt will be discharged with symptomatic treatment.  Verbalizes understanding and is agreeable with plan. Pt is hemodynamically stable & in NAD prior to dc. Patient likely has sinusitis, but she is been being treated with Cipro, Levaquin and doxycycline for this. I discussed the patient with Dr. Estell Harpin, because the patient requests a CT scan. Dr. Estell Harpin, states the because this nonemergent condition, we'll forego CT tonight, but recommend ENT followup. Continue medications as prescribed. Followup with ENT. Patient understands and agrees with plan. She is stable and ready for discharge.    Roxy Horseman, PA-C 07/07/13 2243

## 2013-07-11 ENCOUNTER — Other Ambulatory Visit (HOSPITAL_COMMUNITY): Payer: Self-pay | Admitting: Internal Medicine

## 2013-07-11 ENCOUNTER — Ambulatory Visit (HOSPITAL_COMMUNITY)
Admission: RE | Admit: 2013-07-11 | Discharge: 2013-07-11 | Disposition: A | Discharge status: Home / Self Care | Payer: 59 | Source: Ambulatory Visit | Attending: Internal Medicine | Admitting: Internal Medicine

## 2013-07-11 DIAGNOSIS — R059 Cough, unspecified: Secondary | ICD-10-CM

## 2013-07-11 DIAGNOSIS — R062 Wheezing: Secondary | ICD-10-CM | POA: Insufficient documentation

## 2013-07-11 DIAGNOSIS — R05 Cough: Secondary | ICD-10-CM | POA: Insufficient documentation

## 2013-07-12 HISTORY — PX: CATARACT EXTRACTION: SUR2

## 2013-07-19 ENCOUNTER — Other Ambulatory Visit (HOSPITAL_COMMUNITY): Payer: Self-pay | Admitting: Internal Medicine

## 2013-07-19 DIAGNOSIS — J3489 Other specified disorders of nose and nasal sinuses: Secondary | ICD-10-CM

## 2013-07-23 ENCOUNTER — Ambulatory Visit (HOSPITAL_COMMUNITY)
Admission: RE | Admit: 2013-07-23 | Discharge: 2013-07-23 | Disposition: A | Discharge status: Home / Self Care | Payer: 59 | Source: Ambulatory Visit | Attending: Internal Medicine | Admitting: Internal Medicine

## 2013-07-23 DIAGNOSIS — J019 Acute sinusitis, unspecified: Secondary | ICD-10-CM | POA: Insufficient documentation

## 2013-07-23 DIAGNOSIS — J3489 Other specified disorders of nose and nasal sinuses: Secondary | ICD-10-CM | POA: Insufficient documentation

## 2013-07-23 DIAGNOSIS — H9209 Otalgia, unspecified ear: Secondary | ICD-10-CM | POA: Insufficient documentation

## 2013-08-02 ENCOUNTER — Telehealth: Payer: Self-pay | Admitting: Oncology

## 2013-08-02 NOTE — Telephone Encounter (Signed)
, °

## 2013-08-16 ENCOUNTER — Encounter: Payer: Self-pay | Admitting: Obstetrics and Gynecology

## 2013-08-22 ENCOUNTER — Ambulatory Visit (INDEPENDENT_AMBULATORY_CARE_PROVIDER_SITE_OTHER): Payer: 59 | Admitting: Obstetrics and Gynecology

## 2013-08-22 ENCOUNTER — Ambulatory Visit: Payer: Self-pay | Admitting: Obstetrics and Gynecology

## 2013-08-22 ENCOUNTER — Encounter: Payer: Self-pay | Admitting: Obstetrics and Gynecology

## 2013-08-22 VITALS — BP 139/79 | HR 77 | Resp 14 | Ht 62.5 in | Wt 140.0 lb

## 2013-08-22 DIAGNOSIS — N63 Unspecified lump in unspecified breast: Secondary | ICD-10-CM

## 2013-08-22 DIAGNOSIS — Z01419 Encounter for gynecological examination (general) (routine) without abnormal findings: Secondary | ICD-10-CM

## 2013-08-22 DIAGNOSIS — Z0000 Encounter for general adult medical examination without abnormal findings: Secondary | ICD-10-CM

## 2013-08-22 DIAGNOSIS — N6320 Unspecified lump in the left breast, unspecified quadrant: Secondary | ICD-10-CM

## 2013-08-22 LAB — POCT URINALYSIS DIPSTICK
Bilirubin, UA: NEGATIVE
Blood, UA: NEGATIVE
Glucose, UA: NEGATIVE
KETONES UA: NEGATIVE
Leukocytes, UA: NEGATIVE
Nitrite, UA: NEGATIVE
PH UA: 6
PROTEIN UA: NEGATIVE
UROBILINOGEN UA: NEGATIVE

## 2013-08-22 MED ORDER — VENLAFAXINE HCL ER 37.5 MG PO CP24: 37.5000 mg | ORAL_CAPSULE | Freq: Every day | ORAL | Status: DC

## 2013-08-22 NOTE — Patient Instructions (Signed)

## 2013-08-22 NOTE — Progress Notes (Signed)
GYNECOLOGY VISIT  PCP:  Katina Dung, MD  Referring provider:   HPI: 63 y.o.   Married  Caucasian  female   G4P2 with No LMP recorded. Patient is postmenopausal.   here for   Annual Exam Status post mastectomy for invasive breast cancer - infiltrating ductal with positive sentinel node. Also treated with chemotherapy, tamoxifen and arimidex.  History of right lymphedema.  Uses a compression sleeve.  History of right arm cellulitis.    Had discharge from left nipple last year.  It was discolored.  Went to the [facility] and had a normal left diagnostic mammogram and ultrasound where were benign.  This was ordered through Dr. Jana Hakim.  This has resolved.   Routine mammogram in August of the left was normal.    Takes Effexor for hot flashes.  Wants to continue.   Broken left wrist due to a fall at work in Fall 2014.  Had community acquired pneumonia end of December 2014.   Hgb:  PCP Urine:  Neg  GYNECOLOGIC HISTORY: No LMP recorded. Patient is postmenopausal. Sexually active:  yes Partner preference: female Contraception:   Menopausal Menopausal hormone therapy: no DES exposure:   no Blood transfusions:  no  Sexually transmitted diseases:   no GYN Procedures:  Lumpectomy 2004 Mammogram:  2014 normal               Pap:   2014 Neg History of abnormal pap smear:  Yes many years ago   OB History   Grav Para Term Preterm Abortions TAB SAB Ect Mult Living   4 2        1        LIFESTYLE: Exercise:    Cardio 4-5 days a week           Tobacco: no Alcohol: no Drug use:   no  OTHER HEALTH MAINTENANCE: Tetanus/TDap: less than 10 years Gardisil: no Influenza:  no Zostavax: yes  Bone density: 06/2012   Osteopenia.  Spine T score - 1.9.  Hip T score - 1.4. Colonoscopy: 2010 (polyps) repeat in 5 years  Cholesterol check: 2014 slightly elevated  (taking Zetia)  Family History  Problem Relation Age of Onset  . Heart disease Father   . Cancer Maternal Aunt     breast   . Heart disease Paternal Uncle   . Heart disease Paternal Grandmother   . Heart disease Paternal Grandfather   . Heart disease Mother   . Hypertension Mother   . Diabetes Brother     Patient Active Problem List   Diagnosis Date Noted  . Nipple discharge in female 12/01/2012  . History of breast cancer 11/30/2012  . History of right mastectomy, T1, N1, 11/13/2002.   Marland Kitchen FATIGUE 10/01/2009  . 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 DYSPNEA ON EXERTION 10/01/2009  . HYPERLIPIDEMIA 09/30/2009   Past Medical History  Diagnosis Date  . Cellulitis and abscess of upper arm and forearm   . Postmastectomy lymphedema rt side  . History of right mastectomy 2004    chemo/ radiation  . Migraines   . Cancer rt breast    2004/ chemo/ radiation  . Allergy   . Hyperlipidemia   . PONV (postoperative nausea and vomiting)     cannot take zofran  . GERD (gastroesophageal reflux disease)   . Sentinel node     Past Surgical History  Procedure Laterality Date  . Rt mastectomy  2004    lymph nodes-7-axillary node dissection  . Nasal sinus surgery  1990/2001  . Mandible surgery  1991  . Port-a-cath removal      in and now out  . Nasal sinus surgery    . Mouth surgery    . Orif finger fracture  02/14/2012    Procedure: OPEN REDUCTION INTERNAL FIXATION (ORIF) METACARPAL (FINGER) FRACTURE;  Surgeon: Wynonia Sours, MD;  Location: [place];  Service: Orthopedics;  Laterality: Right;  RIGHT FIFTH   . Breast lumpectomy  2004     br bx  . Orif metacarpal fracture  8/13    rt   . Repair extensor tendon Left 05/30/2013    Procedure: RELEASE TRANSPOSITION EXTENSOR POLLICUS LONGUS LEFT WRIST;  Surgeon: Wynonia Sours, MD;  Location: [place];  Service: Orthopedics;  Laterality: Left;  . Breast lumpectomy with axillary lymph node dissection    . Carpal tunnel release      ALLERGIES: Acetaminophen; Amoxicillin-pot clavulanate; Azithromycin; Cefuroxime axetil; Cephalexin;  Clarithromycin; Codeine; Hydrocodone-acetaminophen; Levofloxacin; Morphine and related; Ondansetron; Propoxyphene n-acetaminophen; Rosuvastatin; Sulfonamide derivatives; Tramadol; Vioxx; and Zofran  Current Outpatient Prescriptions  Medication Sig Dispense Refill  . ALPRAZolam (XANAX XR) 0.5 MG 24 hr tablet Take 0.5 mg by mouth 2 (two) times daily as needed. For anxiety       . calcium carbonate (OS-CAL) 600 MG TABS Take 600 mg by mouth 2 (two) times daily with a meal.        . Cholecalciferol (VITAMIN D) 2000 UNITS CAPS Take 1 capsule by mouth daily.       Marland Kitchen eletriptan (RELPAX) 40 MG tablet Take 40 mg by mouth as needed for migraine or headache. One tablet by mouth at onset of headache. May repeat in 2 hours if headache persists or recurs.      Marland Kitchen ezetimibe (ZETIA) 10 MG tablet Take 10 mg by mouth at bedtime.       . fluticasone (FLONASE) 50 MCG/ACT nasal spray Place 2 sprays into the nose daily.       Marland Kitchen ketotifen (REFRESH EYE ITCH RELIEF) 0.025 % ophthalmic solution Place 1 drop into both eyes 2 (two) times daily.      . Multiple Vitamins-Minerals (MULTIVITAMINS THER. W/MINERALS) TABS Take 1 tablet by mouth daily.        . Olopatadine HCl (PATADAY) 0.2 % SOLN Apply 1 drop to eye daily.      . pantoprazole (PROTONIX) 40 MG tablet Take 40 mg by mouth daily as needed (for GERD).       . Probiotic Product (PROBIOTIC DAILY PO) Take by mouth.      . venlafaxine (EFFEXOR-XR) 37.5 MG 24 hr capsule Take 37.5 mg by mouth daily with breakfast.       . cyclobenzaprine (FLEXERIL) 10 MG tablet Take 10 mg by mouth daily as needed. For pain      . promethazine (PHENERGAN) 25 MG tablet as needed.        No current facility-administered medications for this visit.     ROS:  Pertinent items are noted in HPI.  SOCIAL HISTORY:  Married.  Glass blower/designer.   PHYSICAL EXAMINATION:    BP 139/79  Pulse 77  Resp 14  Ht 5' 2.5" (1.588 m)  Wt 140 lb (63.504 kg)  BMI 25.18 kg/m2   Wt Readings from Last 3  Encounters:  08/22/13 140 lb (63.504 kg)  07/07/13 140 lb (63.504 kg)  05/30/13 145 lb 2 oz (65.828 kg)     Ht Readings from Last 3 Encounters:  08/22/13 5'  2.5" (1.588 m)  07/07/13 5' 2.5" (1.588 m)  05/30/13 5\' 2"  (1.575 m)    General appearance: alert, cooperative and appears stated age Head: Normocephalic, without obvious abnormality, atraumatic Neck: no adenopathy, supple, symmetrical, trachea midline and thyroid not enlarged, symmetric, no tenderness/mass/nodules Lungs: clear to auscultation bilaterally Breasts: Right breast absent.  Inspection small scar superiorly, No nipple retraction or dimpling, No nipple discharge or bleeding, No axillary or supraclavicular adenopathy,  0.75 cm lump at 7 o'clock noted. Heart: regular rate and rhythm Abdomen: soft, non-tender; no masses,  no organomegaly Extremities: extremities normal, atraumatic, no cyanosis or edema Skin: Skin color, texture, turgor normal. No rashes or lesions Lymph nodes: Cervical, supraclavicular, and axillary nodes normal. No abnormal inguinal nodes palpated Neurologic: Grossly normal  Pelvic: External genitalia:  no lesions              Urethra:  normal appearing urethra with no masses, tenderness or lesions              Bartholins and Skenes: normal                 Vagina: normal appearing vagina with normal color and discharge, no lesions              Cervix: normal appearance              Pap and high risk HPV testing done: no.            Bimanual Exam:  Uterus:  uterus is normal size, shape, consistency and nontender                                      Adnexa: normal adnexa in size, nontender and no masses                                      Rectovaginal: Confirms                                      Anus:  normal sphincter tone, no lesions, hemorrhoids noted.   ASSESSMENT  Left breast lump. History of right breast cancer.  Status post right mastectomy.  Osteopenia.    PLAN  Diagnostic left  mammogram and left breast ultrasound at [facility].  Pap smear and high risk HPV testing not indicated until 2019. Counseled on Ca, Vit D, weight bearing exercise. Bone density beginning of 2016.  Refill Effexor for one year. Medications per Epic orders Return annually or prn   An After Visit Summary was printed and given to the patient.

## 2013-08-22 NOTE — Progress Notes (Signed)
Patient scheduled for L dx Mammogram and L Breast U/S at [facility] imaging for 2/19 at 10:00. Patient agreeable to time/date/location. Placed in [facility] hold.

## 2013-08-30 ENCOUNTER — Ambulatory Visit
Admission: RE | Admit: 2013-08-30 | Discharge: 2013-08-30 | Disposition: A | Discharge status: Home / Self Care | Payer: 59 | Source: Ambulatory Visit | Attending: Obstetrics and Gynecology | Admitting: Obstetrics and Gynecology

## 2013-08-30 DIAGNOSIS — N6320 Unspecified lump in the left breast, unspecified quadrant: Secondary | ICD-10-CM

## 2013-09-03 ENCOUNTER — Telehealth: Payer: Self-pay | Admitting: Emergency Medicine

## 2013-09-03 NOTE — Telephone Encounter (Signed)
Message copied by Michele Mcalpine on Mon Sep 03, 2013  1:39 PM ------      Message from: Pike: Sun Sep 02, 2013 12:25 PM       Olivia Mackie,            Please contact the patient and schedule a recheck with me for mid March.      I know that her diagnostic mammogram and ultrasound were normal, but I would like to physically examine her again.            Please place her in your recall file for September. ------

## 2013-09-03 NOTE — Telephone Encounter (Signed)
Yes, I would still like to re-examine the patient.  I felt a lump at her visit. If I continue to feel a lump, I would recommend evaluation with her general surgeon.   Thanks!

## 2013-09-03 NOTE — Telephone Encounter (Signed)
Dr. Quincy Simmonds,.   Spoke with patient. She has an appointment with Dr. Jana Hakim on 3/3, she is wondering if you still need to see her she states "It doesn't make any difference to me, but I know I will get checked there too".  Do you still want her to come in with you?

## 2013-09-04 NOTE — Telephone Encounter (Addendum)
Spoke with patient, message from Dr. Quincy Simmonds given. Appointment scheduled for Breast Check with Dr. Quincy Simmonds for 3/12 at 2:00.   Encounter closed.

## 2013-09-10 ENCOUNTER — Other Ambulatory Visit: Payer: Self-pay | Admitting: *Deleted

## 2013-09-10 DIAGNOSIS — Z853 Personal history of malignant neoplasm of breast: Secondary | ICD-10-CM

## 2013-09-11 ENCOUNTER — Other Ambulatory Visit (HOSPITAL_BASED_OUTPATIENT_CLINIC_OR_DEPARTMENT_OTHER): Payer: 59

## 2013-09-11 ENCOUNTER — Ambulatory Visit (HOSPITAL_BASED_OUTPATIENT_CLINIC_OR_DEPARTMENT_OTHER): Payer: 59 | Admitting: Oncology

## 2013-09-11 ENCOUNTER — Telehealth: Payer: Self-pay | Admitting: Oncology

## 2013-09-11 VITALS — BP 136/84 | HR 83 | Temp 98.5°F | Resp 18 | Ht 62.5 in | Wt 141.9 lb

## 2013-09-11 DIAGNOSIS — Z853 Personal history of malignant neoplasm of breast: Secondary | ICD-10-CM

## 2013-09-11 DIAGNOSIS — R0989 Other specified symptoms and signs involving the circulatory and respiratory systems: Secondary | ICD-10-CM

## 2013-09-11 DIAGNOSIS — E785 Hyperlipidemia, unspecified: Secondary | ICD-10-CM

## 2013-09-11 DIAGNOSIS — Z9011 Acquired absence of right breast and nipple: Secondary | ICD-10-CM

## 2013-09-11 DIAGNOSIS — R0609 Other forms of dyspnea: Secondary | ICD-10-CM

## 2013-09-11 LAB — CBC WITH DIFFERENTIAL/PLATELET
BASO%: 1.3 % (ref 0.0–2.0)
Basophils Absolute: 0.1 10*3/uL (ref 0.0–0.1)
EOS%: 3.9 % (ref 0.0–7.0)
Eosinophils Absolute: 0.2 10*3/uL (ref 0.0–0.5)
HEMATOCRIT: 41 % (ref 34.8–46.6)
HGB: 13.4 g/dL (ref 11.6–15.9)
LYMPH%: 35.8 % (ref 14.0–49.7)
MCH: 28.7 pg (ref 25.1–34.0)
MCHC: 32.7 g/dL (ref 31.5–36.0)
MCV: 87.8 fL (ref 79.5–101.0)
MONO#: 0.4 10*3/uL (ref 0.1–0.9)
MONO%: 6.8 % (ref 0.0–14.0)
NEUT#: 3.2 10*3/uL (ref 1.5–6.5)
NEUT%: 52.2 % (ref 38.4–76.8)
PLATELETS: 289 10*3/uL (ref 145–400)
RBC: 4.67 10*6/uL (ref 3.70–5.45)
RDW: 14.9 % — ABNORMAL HIGH (ref 11.2–14.5)
WBC: 6.2 10*3/uL (ref 3.9–10.3)
lymph#: 2.2 10*3/uL (ref 0.9–3.3)

## 2013-09-11 LAB — COMPREHENSIVE METABOLIC PANEL (CC13)
ALT: 15 U/L (ref 0–55)
ANION GAP: 8 meq/L (ref 3–11)
AST: 18 U/L (ref 5–34)
Albumin: 3.9 g/dL (ref 3.5–5.0)
Alkaline Phosphatase: 78 U/L (ref 40–150)
BILIRUBIN TOTAL: 0.33 mg/dL (ref 0.20–1.20)
BUN: 9.3 mg/dL (ref 7.0–26.0)
CO2: 31 mEq/L — ABNORMAL HIGH (ref 22–29)
Calcium: 10 mg/dL (ref 8.4–10.4)
Chloride: 103 mEq/L (ref 98–109)
Creatinine: 0.8 mg/dL (ref 0.6–1.1)
Glucose: 80 mg/dl (ref 70–140)
Potassium: 4.7 mEq/L (ref 3.5–5.1)
Sodium: 141 mEq/L (ref 136–145)
Total Protein: 6.9 g/dL (ref 6.4–8.3)

## 2013-09-11 NOTE — Telephone Encounter (Signed)
, °

## 2013-09-11 NOTE — Progress Notes (Signed)
ID: Kylie Tucker   DOB: 1950-10-01  MR#: [id number]  KDC#:[id number]  PCP: Kirk Ruths, MD GYN: Wyvonnia Lora SU:  OTHER MD: Cindee Salt   HISTORY OF PRESENT ILLNESS: The patient palpated a lump in the lower outer quadrant of the right breast.  Mammography and ultrasound were performed on 08-31-02.  Mammogram was unremarkable, just showing dense breast tissue.  Ultrasound showed a single 9 mm. lesion at the 9:00 position, 5 cm. from the nipple, as well as several adjacent simple cysts.  Core biopsy of the lesion confirmed an invasive mammary carcinoma.  She also had bilateral breast MRI performed on 09-10-02, and this showed multiple enhancing parenchymal nodules bilaterally.  On 09-17-02, Dr. Ovidio Kin performed an excisional biopsy of the right breast mass as well as sentinel lymph node biopsy followed by completion axillary dissection.  The lumpectomy specimen contained multifocal invasive mammary carcinoma, at least three separate foci, measuring 1.2, 0.9 and 0.6 cm.  Invasive tumor came within 0.5 mm. of the caudal margin and 1 mm. of the cephalic margin.  The excisional biopsy specimen grossly measured 8.8 x 4.8 x 3.4 cm.  The tumor was tubulolobular.  The tumor was associated with lymphovascular invasion.  A total of two out of three sentinel lymph nodes were involved with metastatic adenocarcinoma, and from axillary dissection, one of four lymph nodes were involved, none within the extracapsular extension.  However, there was also an intramammary lymph node involved, for a total of four out of eight positive lymph nodes.  The tumor was ER positive at 47% and PR positive at 17%, had an elevation proliferation marker of 44%, and was HER-2/neu 3+ positive. Her subsequent history is as detailed below  INTERVAL HISTORY: Kylie Tucker returns for followup of her breast cancer history. Since her last visit here she fell and fractured her left wrist. This is healing but has kept her out of work. When  asked what her "worst problem" is however she says it is her insomnia, which is chronic.  REVIEW OF SYSTEMS: Aside from the left wrist problem she also has pain in the left shoulder area. She had upper respiratory infection which has cleared. She has pain in her lower back and some muscle aches here in there which are long-standing. She has tried Ambien for sleep but it "makes her confused". Her hearing is getting worse. She has seasonal sinus symptoms. She has rare headaches. Her hot flashes "about the same". Overall a detailed review of systems today was stable except for the recent fall and its aftermath  PAST MEDICAL HISTORY: Past Medical History  Diagnosis Date  . Cellulitis and abscess of upper arm and forearm   . Postmastectomy lymphedema rt side  . History of right mastectomy 2004    chemo/ radiation  . Migraines   . Cancer rt breast    2004/ chemo/ radiation  . Allergy   . Hyperlipidemia   . PONV (postoperative nausea and vomiting)     cannot take zofran  . GERD (gastroesophageal reflux disease)   . Sentinel node   She had some type of RT (?orthovoltage vs Cobalt) at [facility] in the late 70's to her lt. lower ribs to treat traumatic injury there, approx. 12 txs. No records available, no tattoos were left. Otherwise healthy except for some GE reflux.  She has undergone excision of breast cysts on the right in the 1980s and on the left in 2002.    PAST SURGICAL HISTORY: Past Surgical History  Procedure Laterality  Date  . Rt mastectomy  2004    lymph nodes-7-axillary node dissection  . Nasal sinus surgery  1990/2001  . Mandible surgery  1991  . Port-a-cath removal      in and now out  . Nasal sinus surgery    . Mouth surgery    . Orif finger fracture  02/14/2012    Procedure: OPEN REDUCTION INTERNAL FIXATION (ORIF) METACARPAL (FINGER) FRACTURE;  Surgeon: Wynonia Sours, MD;  Location: [place];  Service: Orthopedics;  Laterality: Right;  RIGHT FIFTH   . Breast  lumpectomy  2004     br bx  . Orif metacarpal fracture  8/13    rt   . Repair extensor tendon Left 05/30/2013    Procedure: RELEASE TRANSPOSITION EXTENSOR POLLICUS LONGUS LEFT WRIST;  Surgeon: Wynonia Sours, MD;  Location: [place];  Service: Orthopedics;  Laterality: Left;  . Breast lumpectomy with axillary lymph node dissection    . Carpal tunnel release      FAMILY HISTORY Family History  Problem Relation Age of Onset  . Heart disease Father   . Cancer Maternal Aunt     breast  . Heart disease Paternal Uncle   . Heart disease Paternal Grandmother   . Heart disease Paternal Grandfather   . Heart disease Mother   . Hypertension Mother   . Diabetes Brother   Her mother had rectal cancer, negative for breast or ovarian cancer.  GYNECOLOGIC HISTORY: GX P2   SOCIAL HISTORY: Married, has two children, works at [employer]  in [place],   [place]: Not in place  HEALTH MAINTENANCE: History  Substance Use Topics  . Smoking status: Never Smoker   . Smokeless tobacco: Never Used  . Alcohol Use: No     Colonoscopy:  PAP:  Bone density:  Lipid panel:  Allergies  Allergen Reactions  . Acetaminophen   . Amoxicillin-Pot Clavulanate   . Azithromycin     Heart palpitations   . Cefuroxime Axetil   . Cephalexin   . Clarithromycin   . Codeine   . Hydrocodone-Acetaminophen   . Levofloxacin   . Morphine And Related   . Ondansetron   . Propoxyphene N-Acetaminophen   . Rosuvastatin     REACTION: myalgia  . Sulfonamide Derivatives   . Tramadol Nausea And Vomiting  . Vioxx [Rofecoxib]   . Zofran [Ondansetron Hcl] Nausea And Vomiting    Current Outpatient Prescriptions  Medication Sig Dispense Refill  . ALPRAZolam (XANAX XR) 0.5 MG 24 hr tablet Take 0.5 mg by mouth 2 (two) times daily as needed. For anxiety       . calcium carbonate (OS-CAL) 600 MG TABS Take 600 mg by mouth 2 (two) times daily with a meal.        . Cholecalciferol  (VITAMIN D) 2000 UNITS CAPS Take 1 capsule by mouth daily.       . cyclobenzaprine (FLEXERIL) 10 MG tablet Take 10 mg by mouth daily as needed. For pain      . eletriptan (RELPAX) 40 MG tablet Take 40 mg by mouth as needed for migraine or headache. One tablet by mouth at onset of headache. May repeat in 2 hours if headache persists or recurs.      Marland Kitchen ezetimibe (ZETIA) 10 MG tablet Take 10 mg by mouth at bedtime.       . fluticasone (FLONASE) 50 MCG/ACT nasal spray Place 2 sprays into the nose daily.       Marland Kitchen  ketotifen (REFRESH EYE ITCH RELIEF) 0.025 % ophthalmic solution Place 1 drop into both eyes 2 (two) times daily.      . Multiple Vitamins-Minerals (MULTIVITAMINS THER. W/MINERALS) TABS Take 1 tablet by mouth daily.        . Olopatadine HCl (PATADAY) 0.2 % SOLN Apply 1 drop to eye daily.      . pantoprazole (PROTONIX) 40 MG tablet Take 40 mg by mouth daily as needed (for GERD).       . Probiotic Product (PROBIOTIC DAILY PO) Take by mouth.      . promethazine (PHENERGAN) 25 MG tablet as needed.       . venlafaxine XR (EFFEXOR-XR) 37.5 MG 24 hr capsule Take 1 capsule (37.5 mg total) by mouth daily with breakfast.  30 capsule  11   No current facility-administered medications for this visit.    OBJECTIVE: Middle-aged white woman in no acute distress Filed Vitals:   09/11/13 1506  BP: 136/84  Pulse: 83  Temp: 98.5 F (36.9 C)  Resp: 18     Body mass index is 25.52 kg/(m^2).    ECOG FS: 1  Sclerae unicteric, pupils round and equal and moist Oropharynx clear No cervical or supraclavicular adenopathy Lungs no rales or rhonchi Heart regular rate and rhythm Abd soft nontender positive bowel sounds MSK no focal spinal tenderness, left wrist in a splint Neuro: nonfocal, well oriented, positive affect Breasts: The right breast is status post mastectomy. There is no evidence of local recurrence. The right axilla is benign The left breast is unremarkable.   LAB RESULTS: Lab Results   Component Value Date   WBC 6.2 09/11/2013   NEUTROABS 3.2 09/11/2013   HGB 13.4 09/11/2013   HCT 41.0 09/11/2013   MCV 87.8 09/11/2013   PLT 289 09/11/2013      Chemistry      Component Value Date/Time   NA 142 12/08/2012 0810   NA 142 06/28/2011 2200   K 4.0 12/08/2012 0810   K 3.9 06/28/2011 2200   CL 103 12/08/2012 0810   CL 105 06/28/2011 2200   CO2 29 12/08/2012 0810   CO2 31 04/14/2011 0523   BUN 16.6 12/08/2012 0810   BUN 17 06/28/2011 2200   CREATININE 0.9 12/08/2012 0810   CREATININE 1.00 06/28/2011 2200      Component Value Date/Time   CALCIUM 9.1 12/08/2012 0810   CALCIUM 8.8 04/14/2011 0523   ALKPHOS 75 12/08/2012 0810   ALKPHOS 99 02/16/2011 1626   AST 18 12/08/2012 0810   AST 21 02/16/2011 1626   ALT 19 12/08/2012 0810   ALT 20 02/16/2011 1626   BILITOT 0.41 12/08/2012 0810   BILITOT 0.2* 02/16/2011 1626       Lab Results  Component Value Date   LABCA2 26 02/16/2011    No components found with this basename: [id number]    No results found for this basename: INR,  in the last 168 hours  Urinalysis    Component Value Date/Time   COLORURINE YELLOW 10/23/2009 0057   APPEARANCEUR CLOUDY* 10/23/2009 0057   LABSPEC 1.025 06/29/2010 1418   PHURINE 6.0 06/29/2010 1418   GLUCOSEU NEGATIVE 06/29/2010 [place] 06/29/2010 1418   BILIRUBINUR n 08/22/2013 [place] 06/29/2010 [place] 06/29/2010 [place] 06/29/2010 1418   UROBILINOGEN negative 08/22/2013 1416   UROBILINOGEN 0.2 06/29/2010 1418   NITRITE n 08/22/2013 1416   NITRITE NEGATIVE 06/29/2010 1418   LEUKOCYTESUR  Negative 08/22/2013 1416    STUDIES: Mm Digital Diagnostic Unilat L  08/30/2013   CLINICAL DATA:  History of right mastectomy. Palpable mass and tenderness in the 7 o'clock location of the left breast.  EXAM: DIGITAL DIAGNOSTIC  LEFT MAMMOGRAM WITH CAD  ULTRASOUND LEFT BREAST  COMPARISON:  03/21/2013 and earlier  ACR Breast Density Category c: The breast  tissue is heterogeneously dense, which may obscure small masses.  FINDINGS: No suspicious mass, distortion, or microcalcifications are identified to suggest presence of malignancy.  Mammographic images were processed with CAD.  On physical exam, I palpate no discrete mass in the lower inner quadrant of the left breast. The patient is slightly tender on physical exam.  Ultrasound is performed, showing normal appearing fibroglandular tissue throughout the lower inner quadrant of the left breast. No mass, distortion, or acoustic shadowing is demonstrated with ultrasound.  IMPRESSION: 1.  No mammographic or ultrasound evidence for malignancy. 2. No mammographic or sonographic abnormality in the 7 o'clock location of the left breast.  RECOMMENDATION: Left screening mammogram is suggested in September 2015.  I have discussed the findings and recommendations with the patient. Results were also provided in writing at the conclusion of the visit. If applicable, a reminder letter will be sent to the patient regarding the next appointment.  BI-RADS CATEGORY  1: Negative.   Electronically Signed   By: Shon Hale M.D.   On: 08/30/2013 10:53   ASSESSMENT: 63 y.o. 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 She took tamoxifen between December of 2004 and July of 2006 at which point she was switched to anastrozole, completing her 5 years of anastrozole in December of 2011  PLAN: I offered to "graduate" [name], but she feels more secure coming here on a once a year basis, and we can certainly accommodate that. We will transition her to our survivorship program once that gets off the ground. Otherwise I will see her again in April of next year, after her yearly mammogram. She knows to call for any problems that may  develop before that visit.  MAGRINAT,GUSTAV C    09/11/2013

## 2013-09-18 ENCOUNTER — Ambulatory Visit
Admission: RE | Admit: 2013-09-18 | Discharge: 2013-09-18 | Disposition: A | Discharge status: Home / Self Care | Payer: 59 | Source: Ambulatory Visit | Attending: Oncology | Admitting: Oncology

## 2013-09-18 DIAGNOSIS — R0609 Other forms of dyspnea: Secondary | ICD-10-CM

## 2013-09-18 DIAGNOSIS — R0989 Other specified symptoms and signs involving the circulatory and respiratory systems: Secondary | ICD-10-CM

## 2013-09-18 DIAGNOSIS — Z9011 Acquired absence of right breast and nipple: Secondary | ICD-10-CM

## 2013-09-18 MED ORDER — GADOBENATE DIMEGLUMINE 529 MG/ML IV SOLN
13.0000 mL | Freq: Once | INTRAVENOUS | Status: AC | PRN
  Administered 2013-09-18: 13 mL via INTRAVENOUS

## 2013-09-20 ENCOUNTER — Telehealth (INDEPENDENT_AMBULATORY_CARE_PROVIDER_SITE_OTHER): Payer: Self-pay

## 2013-09-20 ENCOUNTER — Ambulatory Visit (INDEPENDENT_AMBULATORY_CARE_PROVIDER_SITE_OTHER): Payer: 59 | Admitting: Obstetrics and Gynecology

## 2013-09-20 ENCOUNTER — Encounter: Payer: Self-pay | Admitting: Obstetrics and Gynecology

## 2013-09-20 VITALS — BP 140/74 | HR 83 | Resp 20 | Ht 62.5 in | Wt 141.0 lb

## 2013-09-20 DIAGNOSIS — N6452 Nipple discharge: Secondary | ICD-10-CM

## 2013-09-20 DIAGNOSIS — N6320 Unspecified lump in the left breast, unspecified quadrant: Secondary | ICD-10-CM

## 2013-09-20 DIAGNOSIS — N6459 Other signs and symptoms in breast: Secondary | ICD-10-CM

## 2013-09-20 DIAGNOSIS — N63 Unspecified lump in unspecified breast: Secondary | ICD-10-CM

## 2013-09-20 NOTE — Progress Notes (Signed)
Patient scheduled for appointment with Dr. Lucia Gaskins on 10/18/13 at 1130. Spoke with Triage Nurse, Caren Griffins and requested an earlier appointment with Dr. Lucia Gaskins and she states she will send a message to Dr. Lucia Gaskins and his assistant and return call to patient upon review of schedule.

## 2013-09-20 NOTE — Telephone Encounter (Signed)
Dr Arther Abbott office called requesting a sooner appt than 10-18-13. Pt has hx of right breast cancer in past and now has mass with nipple d/c in left breast. Advised I will send msg to Dr Pollie Friar assistant to review schedule with him for ability to move date up. Dr Arther Abbott office requests we call pt with change of date. Imaging has been done and is in epic. Pt can be reached at [phone] or [phone].

## 2013-09-20 NOTE — Progress Notes (Signed)
Patient ID: Kylie Tucker, female   DOB: [birth year], 63 y.o.   MRN: [id number]  Subjective  63 y.o. Married Caucasian female  G4P2 with No LMP recorded. Patient is postmenopausal.  here for a breast recheck for a mass found on routine exam in February.  Status post mastectomy for invasive breast cancer - infiltrating ductal with positive sentinel node.  Also treated with chemotherapy, tamoxifen and arimidex.  History of right lymphedema. Uses a compression sleeve. History of right arm cellulitis.  Had discharge from left nipple last year. It was discolored. Went to the [facility] and had a normal left diagnostic mammogram and ultrasound where were benign. This was ordered through Dr. Jana Hakim.  Patient perceives that this is still occurring. Routine mammogram in August of the left was normal.   Objective  Left breast - Inspection small scar superiorly, No nipple retraction or dimpling, Nipple discharge noted. No axillary or supraclavicular adenopathy, 0.75 cm lump at 7 o'clock noted.  DATA: History of right mastectomy. Palpable mass and  tenderness in the 7 o'clock location of the left breast.  EXAM:  DIGITAL DIAGNOSTIC LEFT MAMMOGRAM WITH CAD  ULTRASOUND LEFT BREAST  COMPARISON: 03/21/2013 and earlier  ACR Breast Density Category c: The breast tissue is heterogeneously  dense, which may obscure small masses.  FINDINGS:  No suspicious mass, distortion, or microcalcifications are  identified to suggest presence of malignancy.  Mammographic images were processed with CAD.  On physical exam, I palpate no discrete mass in the lower inner  quadrant of the left breast. The patient is slightly tender on  physical exam.  Ultrasound is performed, showing normal appearing fibroglandular  tissue throughout the lower inner quadrant of the left breast. No  mass, distortion, or acoustic shadowing is demonstrated with  ultrasound.  IMPRESSION:  1. No mammographic or ultrasound evidence for  malignancy.  2. No mammographic or sonographic abnormality in the 7 o'clock  location of the left breast.  RECOMMENDATION:  Left screening mammogram is suggested in September 2015.  I have discussed the findings and recommendations with the patient.  Results were also provided in writing at the conclusion of the  visit. If applicable, a reminder letter will be sent to the patient  regarding the next appointment.  BI-RADS CATEGORY 1: Negative.  Electronically Signed  By: Shon Hale M.D.  On: 08/30/2013 10:53    CLINICAL DATA  History of malignant right mastectomy 2004. Recent history of  palpable mass and tenderness 7 o'clock position of the left breast  with negative mammographic/sonographic evaluation 08/30/2013.  History of left nipple discharge.  LABS  Creatinine 0.8 drawn 09/11/2013 with GFR 73.  EXAM  BILATERAL BREAST MRI WITH AND WITHOUT CONTRAST  TECHNIQUE  Multiplanar, multisequence MR images of both breasts were obtained  prior to and following the intravenous administration of 45ml of  MultiHance.  THREE-DIMENSIONAL MR IMAGE RENDERING ON INDEPENDENT WORKSTATION:  Three-dimensional MR images were rendered by post-processing of the  original MR data on an independent workstation. The  three-dimensional MR images were interpreted, and findings are  reported in the following complete MRI report for this study. Three  dimensional images were evaluated at the independent DynaCad  workstation  COMPARISON  Previous exams including prior MRI 02/24/2011 and recent  mammogram/ultrasound 02/19/205  FINDINGS  Breast composition: c: Heterogeneous fibroglandular tissue.  Background parenchymal enhancement: Mild.  Right breast: No mass or abnormal enhancement over the mastectomy  site.  Left breast: No mass or abnormal enhancement.  Lymph nodes: No  abnormal appearing lymph nodes.  Ancillary findings: 2 liver cysts unchanged.  IMPRESSION  Unremarkable breast MRI without  evidence of malignancy. Prior right  mastectomy.  RECOMMENDATION  Recommend continued annual screening mammographic followup.  Recommend continued management of patient's left nipple discharge on  a clinical basis.  BI-RADS CATEGORY  1: Negative.  SIGNATURE  Electronically Signed  By: Marin Olp M.D.  On: 09/19/2013 09:49  Objective  Left breast - 0.75 cm mass at 7 o'clock.  No retractions or axillary nodes.  Clear left nipple discharge noted with expression.   Assessment  History of invasive right breast cancer.  Status post mastectomy. Left breast mass.  Negative diagnostic mammogram, ultrasound, and MRI.   Left nipple discharge.   Plan  Will check TSH and prolactin levels. Will have patient see her surgeon, Dr. Lucia Gaskins for further opinion and discussion if biopsy is warranted.   After visit summary to patient.   15 minutes face to face time of which over 50% was spent in counseling.

## 2013-09-21 ENCOUNTER — Telehealth: Payer: Self-pay | Admitting: Obstetrics and Gynecology

## 2013-09-21 LAB — PROLACTIN: Prolactin: 6.2 ng/mL

## 2013-09-21 LAB — TSH: TSH: 2.404 u[IU]/mL (ref 0.350–4.500)

## 2013-09-21 NOTE — Telephone Encounter (Signed)
Patient is now scheduled for 3/20 at 0845 with Dr. Lucia Gaskins. Message from Dr. Quincy Simmonds to advise nothing further needed at this time.  Patient will call back with any further needs.

## 2013-09-21 NOTE — Telephone Encounter (Signed)
Nothing further is needed. All imaging results are in Epic.   Thanks.

## 2013-09-21 NOTE — Telephone Encounter (Signed)
Call back to patient to advise, LMTCB.

## 2013-09-21 NOTE — Telephone Encounter (Signed)
Pt states she is calling Estill Bamberg back but there is no note or message

## 2013-09-21 NOTE — Telephone Encounter (Signed)
Returned patient call. Patient got message of normal lab results. Has an appointment with Dr.Newman next week. Anything we need to do before then?  Please advise.

## 2013-09-24 ENCOUNTER — Telehealth: Payer: Self-pay | Admitting: *Deleted

## 2013-09-24 NOTE — Telephone Encounter (Signed)
Pt left message stating she was to have an MRI of breast due to a new area of concern noted by GYN in L breast.  This RN obtained report showing MRI without evidence of concern.  Report will be given to MD.

## 2013-09-27 ENCOUNTER — Telehealth: Payer: Self-pay | Admitting: Obstetrics and Gynecology

## 2013-09-27 NOTE — Telephone Encounter (Signed)
Per Tru with Dr Jennell Corner office, patient is scheduled 03.20.2015 @ [phone]

## 2013-09-28 ENCOUNTER — Ambulatory Visit (INDEPENDENT_AMBULATORY_CARE_PROVIDER_SITE_OTHER): Payer: 59 | Admitting: Surgery

## 2013-09-28 ENCOUNTER — Encounter (INDEPENDENT_AMBULATORY_CARE_PROVIDER_SITE_OTHER): Payer: Self-pay | Admitting: Surgery

## 2013-09-28 VITALS — BP 138/90 | HR 80 | Temp 98.0°F | Resp 18 | Ht 64.0 in | Wt 140.0 lb

## 2013-09-28 DIAGNOSIS — Z9011 Acquired absence of right breast and nipple: Secondary | ICD-10-CM

## 2013-09-28 DIAGNOSIS — Z9010 Acquired absence of unspecified breast and nipple: Secondary | ICD-10-CM

## 2013-09-28 NOTE — Progress Notes (Signed)
[place], MD,  [address]  [place], [place]    [place] Phone:  [phone] FAX:  [phone]   Re:   Kylie Tucker (was Caprisha Bridgett) DOB:   [birth year] MRN:   [id number]  ASSESSMENT AND PLAN: 1.  T1, N1 right breast cancer.  (3/7 nodes positive)  ER - 47%, PR - 17%, Ki67 - 44%, Her2Neu - 3+ positive  Right mastectomy - 11/13/2002.  Treated with 4 cycles doxorubicin/cyclophosphamide followed by paclitaxel x 12, then took anastrazole x 5 years  Sees Dr. Darnell Level. Magrinat.  Disease free.  I will see her back in 6 months for a close recheck of her left breast.  2.  Questionable mass, 7 o'clock left breast   This was first noticed by Dr. Jani Files in January 2015.  I can feel some lumpiness to her left breast, but I do not feel a suspicious mass.  She has had a thorough workup with mammogram/US/MRI.  But I will follow her in 6 months. 3.  Minimal left nipple dscharge  This is straw colored fluid which is minimal. I would do nothing further at this time.  4.  Lymphedema right arm. 5.  Left shoulder pain.  She was seeing Dr. Veverly Fells, but now she is going to see Dr. Daylene Katayama. 6.  Left wrist injurt at work - [employer]  She had surgery by Dr. Fredna Dow - 04/30/2013.  She has been in physical therapy and now is going to address her left shoulder trouble.  HISTORY OF PRESENT ILLNESS: Chief Complaint  Patient presents with  . Breast Cancer Long Term Follow Up    Kylie Tucker is a 63 y.o. (DOB: [birth year])  white female who is a patient of MCGOUGH,WILLIAM M, MD and comes to me today for follow up of right breast cancer and a questionable mass in her left breast.  Dr. Quincy Simmonds felt something at Kylie 7 o'clock in Kylie left breast.  This has prompted a work up for any abnormality.  Kylie patient herself does not feel anything obvious in Kylie left breast. Her workup has included: MRI - 09/18/2013 - negative [place] of left breast - 08/30/2013 -  negative Mammogram - 03/21/2013 - negative  Her biggest issue is that she fell at work on 04/30/2013 and injured her left hand.  It appears she had a fracture of her left distal radius, nondisplaced. Dr. Fredna Dow operated on her left hand on 05/30/2013, she is still getting PT.  But now she is having trouble with her left shoulder and she is to see Dr. Daylene Katayama.  She has pain when she lifts Kylie arm.  SOCIAL HISTORY: Works at [employer] - she has been out of work since Oct 2014 because of her left wrist injury. She is hoping to retire this summer.  It is unclear whether she will try to go back to work.  PHYSICAL EXAM: BP 138/90  Pulse 80  Temp(Src) 98 F (36.7 C)  Resp 18  Ht _0  (1.626 m)  Wt 140 lb (63.504 kg)  BMI 24.02 kg/m2  HEENT:  Pupils equal.  Dentition good.  No injury. NECK:  Supple.  No thyroid mass. LYMPH NODES:  No cervical, supraclavicular, or axillary adenopathy. BREASTS -  RIGHT:  Absent.  No palpable mass or nodule.   LEFT:  No palpable mass or nodule.  Minimal straw colored nipple discharge.  Kylie amount of fluid is so small, I cannot tell which duct  this comes from. UPPER EXTREMITIES:  Right arm lymphedema.  Has sleeve on. Stable.  Left arm:  Scar on dorsum of left wrist.  She says that she can lift her left arm only 90 degrees without pain.  DATA REVIEWED: Mammogram 03/16/2012 - negative  Alphonsa Overall, MD, [place] Office:  [phone]

## 2013-10-18 ENCOUNTER — Ambulatory Visit (INDEPENDENT_AMBULATORY_CARE_PROVIDER_SITE_OTHER): Payer: 59 | Admitting: Surgery

## 2013-11-12 ENCOUNTER — Telehealth: Payer: Self-pay | Admitting: Obstetrics and Gynecology

## 2013-11-12 NOTE — Telephone Encounter (Signed)
Spoke with patient. Patient states that she has noticed blood when she has intercourse. "I do not know if it is my husband or if it is me. I had an aex not long ago and everything was okay but I want to get it checked out." Patient requesting appointment for tomorrow afternoon. Advised Dr.Silva is not in the office tomorrow. Patient requesting to see another provider as she would like to come in tomorrow for appointment stating "I really just want to get this checked out to make sure I am okay." Appointment scheduled for tomorrow at 1400 with Dr.Lathrop. Patient agreeable to date and time.  Routing to provider for final review. Patient agreeable to disposition. Will close encounter  CC:Dr.Lathrop

## 2013-11-12 NOTE — Telephone Encounter (Signed)
Patient says she is having some problems. No details given. Patient would like an appointment with Dr.Silva ASAP.

## 2013-11-13 ENCOUNTER — Other Ambulatory Visit: Payer: Self-pay | Admitting: Orthopedic Surgery

## 2013-11-13 ENCOUNTER — Encounter: Payer: Self-pay | Admitting: Gynecology

## 2013-11-13 ENCOUNTER — Ambulatory Visit (INDEPENDENT_AMBULATORY_CARE_PROVIDER_SITE_OTHER): Payer: 59 | Admitting: Gynecology

## 2013-11-13 VITALS — BP 118/66 | HR 88 | Resp 18 | Ht 64.0 in | Wt 140.0 lb

## 2013-11-13 DIAGNOSIS — N930 Postcoital and contact bleeding: Secondary | ICD-10-CM

## 2013-11-13 DIAGNOSIS — N950 Postmenopausal bleeding: Secondary | ICD-10-CM

## 2013-11-13 NOTE — Progress Notes (Signed)
Pt with personal history of breast cancer and tamoxifen use, reports bleeding after sex for last 1-85m.   Pt states she has blood when she wipes and is on the sheets.  pt reports none in the morning.  Pt reports tamoxifen for 3y and arimedex for 2 and off hormones for 5y.  Pt has never had bleeding before.  Menopause since 2004.  No vaginal lubricants.  Last coitus about 36h ago, bleeding noted, no pain. ROS: per HPI. PE: BP 118/66  Pulse 88  Resp 18  Ht 5\' 4"  (1.626 m)  Wt 140 lb (63.504 kg)  BMI 24.02 kg/m2 General appearance: alert, cooperative and appears stated age Abdomen: soft nontender, no rebouund Pelvic: cervix normal in appearance, external genitalia normal, no adnexal masses or tenderness, no cervical motion tenderness, urethra without abnormality or discharge, uterus normal size, shape, and consistency and vagina normal without discharge   Based on history and lack of physical finding of laceration or trauma, recommend an EMB.  Endometrial Biopsy Procedure Note   Procedure Details    The risks (including infection, bleeding, pain, and uterine perforation) and benefits of the procedure were explained to the patient and Written informed consent was obtained.    The patient was placed in the dorsal lithotomy position.  Bimanual exam showed the uterus to be in the anteroflexed position.  A Graves' speculum inserted in the vagina, and the cervix prepped with povidone iodine.  Xylocaine jelly was placed in endocervix and externally  A sharp tenaculum was applied to the anterior lip of the cervix for stabilization.  A Pipelle endometrial aspirator was used to sample the endometrium to a depth of 6cm.  Minimal tissue was obtained.  Pt felt faint at this point and repeat sampling was not able to be done.  Supportive care was given, including cool compress and ginger ale.  After pt was feeling better, we agreed to have PUS and reassess need for sampling.  Sample was sent for pathologic  examination.  Condition: Stable  Plan:  The patient was advised to call for any fever or for prolonged or severe pain or bleeding. She was advised to use NSAID as needed for mild to moderate pain. She was advised to avoid vaginal intercourse for 48 hours or until the bleeding has completely stopped.

## 2013-11-14 ENCOUNTER — Telehealth: Payer: Self-pay | Admitting: Gynecology

## 2013-11-14 NOTE — Telephone Encounter (Signed)
Called to check on pt after vagal response to EMB in office yesterday.  Pt states she is feeling well, 100% normal.  She states that over the last few years she has started to vagal with both procedures and needles.  She appreciates that call today.  We will get back with the results, she knows might be insufficient material

## 2013-11-16 ENCOUNTER — Telehealth: Payer: Self-pay | Admitting: Gynecology

## 2013-11-16 LAB — IPS OTHER TISSUE BIOPSY

## 2013-11-16 NOTE — Telephone Encounter (Signed)
Spoke with patient. Advised of $17 copay quoted by insurance company for [place]. Patient agreeable. Scheduled PUS.

## 2013-11-19 ENCOUNTER — Encounter (HOSPITAL_BASED_OUTPATIENT_CLINIC_OR_DEPARTMENT_OTHER): Payer: Self-pay | Admitting: *Deleted

## 2013-11-19 ENCOUNTER — Telehealth: Payer: Self-pay | Admitting: Gynecology

## 2013-11-19 NOTE — Telephone Encounter (Signed)
Message copied by Kassaundra Hair L on Mon Nov 19, 2013  2:54 PM ------      Message from: LATHROP, Jenniffer Vessels      Created: Mon Nov 19, 2013  8:57 AM       Inform biopsy is benign, atrophy c/w menopausal state ------ 

## 2013-11-19 NOTE — Telephone Encounter (Signed)
Message copied by Michele Mcalpine on Mon Nov 19, 2013  2:54 PM ------      Message from: Elveria Rising      Created: Mon Nov 19, 2013  8:57 AM       Inform biopsy is benign, atrophy c/w menopausal state ------

## 2013-11-19 NOTE — Telephone Encounter (Deleted)
Call pt 

## 2013-11-19 NOTE — Telephone Encounter (Signed)
Spoke with patient. She states that she has urinated x 2 today and with each bathroom visit she notices blood on the tissue after wiping. Not having to wear a pad or liner. Denies abdominal, flank and pelvic pain, denies fevers, denies vaginal discharge. States she feels well. I advised that some slight spotting after procedure is WNL. Patient has appointment tomorrow for PUS and consult with Dr. Charlies Constable. Advised patient that she should keep appointment as scheduled for follow up. She is agreeable. Advised to call back if bleeding increases, develops fevers, pelvic pain, or any vaginal discharge.   Patient is agreeable.  Advised would route to Dr. Charlies Constable as well for any further instructions.  Patient was given results of biopsy as well.

## 2013-11-19 NOTE — Telephone Encounter (Signed)
Patient has "blood and something that looks like tissue in her urine." Patient recently had a biopsy done, is this normal?

## 2013-11-20 ENCOUNTER — Ambulatory Visit (INDEPENDENT_AMBULATORY_CARE_PROVIDER_SITE_OTHER): Payer: 59

## 2013-11-20 ENCOUNTER — Ambulatory Visit (INDEPENDENT_AMBULATORY_CARE_PROVIDER_SITE_OTHER): Payer: 59 | Admitting: Gynecology

## 2013-11-20 VITALS — BP 138/70 | HR 64 | Resp 18 | Ht 62.5 in

## 2013-11-20 DIAGNOSIS — N950 Postmenopausal bleeding: Secondary | ICD-10-CM

## 2013-11-20 DIAGNOSIS — N930 Postcoital and contact bleeding: Secondary | ICD-10-CM

## 2013-11-20 DIAGNOSIS — Z09 Encounter for follow-up examination after completed treatment for conditions other than malignant neoplasm: Secondary | ICD-10-CM

## 2013-11-20 DIAGNOSIS — Z9229 Personal history of other drug therapy: Secondary | ICD-10-CM

## 2013-11-20 DIAGNOSIS — Z9221 Personal history of antineoplastic chemotherapy: Secondary | ICD-10-CM

## 2013-11-20 NOTE — Progress Notes (Signed)
      Pt here for PUS after EMB for minimal tissue done for PCB.  Pt reports that she noticed some red with void on Monday and again after her PUS today.  Images reviewed with pt, biopsy results of atrophic endometrium. Uterus is small, atrophic 5.3x3.3x2.5cm, EMS 2.65mm, adnexa negative, no FF. 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 rto in 42m to reassess To call for bleeding not related to vaginal penetration Pt agrees to plan. 20m spent in counseling, >50% face to face

## 2013-11-20 NOTE — Telephone Encounter (Signed)
Patient seen in office today for appointment with Dr. Charlies Constable. Will close encounter.

## 2013-11-21 ENCOUNTER — Telehealth: Payer: Self-pay | Admitting: Gynecology

## 2013-11-21 NOTE — Telephone Encounter (Signed)
i sent him a note thru epic, waiting to here

## 2013-11-21 NOTE — Telephone Encounter (Signed)
Dr. Charlies Constable, were you able to communicate with Dr. Jana Hakim regarding medication?   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

## 2013-11-21 NOTE — Discharge Instructions (Signed)

## 2013-11-21 NOTE — H&P (Signed)
Kylie Tucker is an 63 y.o. female.   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 She presents at the suggestion of Dr. Fredna Dow for evaluation of a painful left shoulder.  Dr. Fredna Dow has completed treatment of a minimally displaced left distal radius fracture sustained when she slipped off a stool onto a concrete floor at work on 04-30-13. She was treated with proper immobilization and developed synovitis of her extensor pollicis longus. To avoid rupture, she underwent synovectomy and rerouting of her extensor pollicis longus on 60-63-01. She has gone on to heal her wrist well.  During her recovery she has been noted to have significant discomfort in her left shoulder. She has pain with elevation. She has pain when she tries to work out and strengthen on the BTE exercise machine. At the present time she is not taking any medications. She is referred for an upper extremity orthopaedic consult.    Past Medical History  Diagnosis Date  . Cellulitis and abscess of upper arm and forearm   . Postmastectomy lymphedema rt side  . History of right mastectomy 2004    chemo/ radiation  . Migraines   . Cancer rt breast    2004/ chemo/ radiation  . Allergy   . Hyperlipidemia   . PONV (postoperative nausea and vomiting)     cannot take zofran  . GERD (gastroesophageal reflux disease)   . Sentinel node   . Anxiety     Past Surgical History  Procedure Laterality Date  . Rt mastectomy  2004    lymph nodes-7-axillary node dissection  . Nasal sinus surgery  1990/2001  . Mandible surgery  1991  . Port-a-cath removal      in and now out  . Nasal sinus surgery    . Mouth surgery    . Orif finger fracture  02/14/2012    Procedure: OPEN REDUCTION INTERNAL FIXATION (ORIF) METACARPAL (FINGER) FRACTURE;  Surgeon: Wynonia Sours, MD;  Location: [facility];  Service: Orthopedics;   Laterality: Right;  RIGHT FIFTH   . Breast lumpectomy  2004     br bx  . Orif metacarpal fracture  8/13    rt   . Repair extensor tendon Left 05/30/2013    Procedure: RELEASE TRANSPOSITION EXTENSOR POLLICUS LONGUS LEFT WRIST;  Surgeon: Wynonia Sours, MD;  Location: [place];  Service: Orthopedics;  Laterality: Left;  . Breast lumpectomy with axillary lymph node dissection    . Carpal tunnel release    . Wrist arthroscopy Left 2014    Family History  Problem Relation Age of Onset  . Heart disease Father   . Cancer Maternal Aunt     breast  . Heart disease Paternal Uncle   . Heart disease Paternal Grandmother   . Heart disease Paternal Grandfather   . Heart disease Mother   . Hypertension Mother   . Diabetes Brother    Social History:  reports that she has never smoked. She has never used smokeless tobacco. She reports that she does not drink alcohol or use illicit drugs.  Allergies:  Allergies  Allergen Reactions  . Acetaminophen Nausea Only  . Amoxicillin-Pot Clavulanate Nausea Only    flushing  . Azithromycin     Heart palpitations   . Cefuroxime Axetil Nausea Only  . Cephalexin Nausea Only  . Clarithromycin Nausea Only  . Codeine Itching  . Hydrocodone-Acetaminophen Hives  .  Levofloxacin Nausea Only  . Morphine And Related Other (See Comments)    Hallucinations  . Ondansetron Nausea And Vomiting  . Propoxyphene N-Acetaminophen Nausea And Vomiting  . Rosuvastatin     REACTION: myalgia  . Sulfonamide Derivatives Itching  . Tramadol Nausea And Vomiting  . Vioxx [Rofecoxib] Nausea And Vomiting  . Zofran [Ondansetron Hcl] Nausea And Vomiting    No prescriptions prior to admission    No results found for this or any previous visit (from the past 48 hour(s)).  US Transvaginal Non-ob  11/20/2013   SEE PROGRESS NOTES FOR RESULTS     Pertinent items are noted in HPI.  Height 5' 2.5" (1.588 m), weight 63.504 kg (140 lb).  General appearance:  alert Head: Normocephalic, without obvious abnormality Neck: supple, symmetrical, trachea midline Resp: clear to auscultation bilaterally Cardio: regular rate and rhythm GI: normal findings: bowel sounds normal Extremities:  Her shoulder ROM reveals combined elevation right shoulder 170, left shoulder 165. She can externally rotate her right and left shoulder 90 degrees. She can internally rotate to T12 bilaterally. She does not show signs of adhesive capsulitis. She has pain on rapid abduction, cross torso adduction, positive impingement signs all on the left. She has some discomfort with resisted forward flexion and scaption. She does not have a positive drop sign. She does not have pseudoparalysis.   Plain x-rays of her shoulder demonstrate marked AC degenerative arthritis and sclerosis at the greater tuberosity on her internal rotation film.   She had a MRI obtained 10/15/13 at [facility].  We reviewed the films directly.  Her films were reviewed by radiologist, Orson Eva.  Dr. Lulu Riding noted rotator cuff tendinopathy, significant abnormal signal in the humeral head.  There was tendinosis of the supraspinatus, infraspinatus, the  long head of the biceps was intact, capsular labral structures were intact.  She has a very large inferior projecting osteophyte that is clearly indenting the rotator cuff even with her arm in a neutral position.    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  Multiple drug allergies and intolerances.  Plan: To the OR for left SA with SAD/DCR and RC repair as needed.The procedure, risks,benefits and post-op course were discussed with the patient at length and they were in agreement with the plan.  We anticipate perioperative plexus block use for pain management.  Kylie Tucker multiple drug intolerances will make post operative pain management  challenging. Kylie Tucker has been able to tolerate oxycodone without tylenol.  Kylie Tucker 11/21/2013, 3:28 PM  H&P documentation: 11/22/2013  -History and Physical Reviewed  -Patient has been re-examined  -No change in the plan of care  Cammie Sickle, MD

## 2013-11-21 NOTE — Telephone Encounter (Signed)
Patient was seen yesterday and her prescription was not sent to her pharmacy. Patient does not know the name of the prescription. Patient says Dr.Lathrop was going to check with her oncologist before calling in her prescription.

## 2013-11-22 ENCOUNTER — Ambulatory Visit (HOSPITAL_BASED_OUTPATIENT_CLINIC_OR_DEPARTMENT_OTHER)
Admission: RE | Admit: 2013-11-22 | Discharge: 2013-11-23 | Disposition: A | Discharge status: Home / Self Care | Payer: Worker's Compensation | Source: Ambulatory Visit | Attending: Orthopedic Surgery | Admitting: Orthopedic Surgery

## 2013-11-22 ENCOUNTER — Encounter (HOSPITAL_BASED_OUTPATIENT_CLINIC_OR_DEPARTMENT_OTHER)
Admission: RE | Disposition: A | Discharge status: Home / Self Care | Payer: Self-pay | Source: Ambulatory Visit | Attending: Orthopedic Surgery

## 2013-11-22 ENCOUNTER — Encounter (HOSPITAL_BASED_OUTPATIENT_CLINIC_OR_DEPARTMENT_OTHER): Payer: Worker's Compensation | Admitting: Anesthesiology

## 2013-11-22 ENCOUNTER — Encounter (HOSPITAL_BASED_OUTPATIENT_CLINIC_OR_DEPARTMENT_OTHER): Payer: Self-pay

## 2013-11-22 ENCOUNTER — Ambulatory Visit (HOSPITAL_BASED_OUTPATIENT_CLINIC_OR_DEPARTMENT_OTHER): Payer: Worker's Compensation | Admitting: Anesthesiology

## 2013-11-22 DIAGNOSIS — E785 Hyperlipidemia, unspecified: Secondary | ICD-10-CM | POA: Insufficient documentation

## 2013-11-22 DIAGNOSIS — Z880 Allergy status to penicillin: Secondary | ICD-10-CM | POA: Insufficient documentation

## 2013-11-22 DIAGNOSIS — M19019 Primary osteoarthritis, unspecified shoulder: Secondary | ICD-10-CM | POA: Insufficient documentation

## 2013-11-22 DIAGNOSIS — Z923 Personal history of irradiation: Secondary | ICD-10-CM | POA: Insufficient documentation

## 2013-11-22 DIAGNOSIS — Z9010 Acquired absence of unspecified breast and nipple: Secondary | ICD-10-CM | POA: Insufficient documentation

## 2013-11-22 DIAGNOSIS — M7580 Other shoulder lesions, unspecified shoulder: Secondary | ICD-10-CM

## 2013-11-22 DIAGNOSIS — Y9289 Other specified places as the place of occurrence of the external cause: Secondary | ICD-10-CM | POA: Insufficient documentation

## 2013-11-22 DIAGNOSIS — M25819 Other specified joint disorders, unspecified shoulder: Secondary | ICD-10-CM | POA: Insufficient documentation

## 2013-11-22 DIAGNOSIS — M75120 Complete rotator cuff tear or rupture of unspecified shoulder, not specified as traumatic: Secondary | ICD-10-CM | POA: Diagnosis present

## 2013-11-22 DIAGNOSIS — K219 Gastro-esophageal reflux disease without esophagitis: Secondary | ICD-10-CM | POA: Insufficient documentation

## 2013-11-22 DIAGNOSIS — S43439A Superior glenoid labrum lesion of unspecified shoulder, initial encounter: Secondary | ICD-10-CM | POA: Insufficient documentation

## 2013-11-22 DIAGNOSIS — Z9221 Personal history of antineoplastic chemotherapy: Secondary | ICD-10-CM | POA: Insufficient documentation

## 2013-11-22 DIAGNOSIS — Z885 Allergy status to narcotic agent status: Secondary | ICD-10-CM | POA: Insufficient documentation

## 2013-11-22 DIAGNOSIS — S43429A Sprain of unspecified rotator cuff capsule, initial encounter: Secondary | ICD-10-CM | POA: Insufficient documentation

## 2013-11-22 DIAGNOSIS — Z886 Allergy status to analgesic agent status: Secondary | ICD-10-CM | POA: Insufficient documentation

## 2013-11-22 DIAGNOSIS — Z881 Allergy status to other antibiotic agents status: Secondary | ICD-10-CM | POA: Insufficient documentation

## 2013-11-22 DIAGNOSIS — Z882 Allergy status to sulfonamides status: Secondary | ICD-10-CM | POA: Insufficient documentation

## 2013-11-22 DIAGNOSIS — Z888 Allergy status to other drugs, medicaments and biological substances status: Secondary | ICD-10-CM | POA: Insufficient documentation

## 2013-11-22 DIAGNOSIS — F411 Generalized anxiety disorder: Secondary | ICD-10-CM | POA: Insufficient documentation

## 2013-11-22 DIAGNOSIS — Z853 Personal history of malignant neoplasm of breast: Secondary | ICD-10-CM | POA: Insufficient documentation

## 2013-11-22 DIAGNOSIS — W07XXXA Fall from chair, initial encounter: Secondary | ICD-10-CM | POA: Insufficient documentation

## 2013-11-22 HISTORY — PX: SHOULDER ARTHROSCOPY WITH ROTATOR CUFF REPAIR AND SUBACROMIAL DECOMPRESSION: SHX5686

## 2013-11-22 HISTORY — DX: Anxiety disorder, unspecified: F41.9

## 2013-11-22 SURGERY — SHOULDER ARTHROSCOPY WITH ROTATOR CUFF REPAIR AND SUBACROMIAL DECOMPRESSION
Anesthesia: General | Site: Shoulder | Laterality: Left

## 2013-11-22 MED ORDER — HYDROMORPHONE HCL PF 1 MG/ML IJ SOLN: 0.2500 mg | INTRAMUSCULAR | Status: DC | PRN

## 2013-11-22 MED ORDER — ONDANSETRON HCL 4 MG/2ML IJ SOLN: 4.0000 mg | Freq: Once | INTRAMUSCULAR | Status: AC | PRN

## 2013-11-22 MED ORDER — OXYCODONE HCL 5 MG PO TABS: ORAL_TABLET | ORAL | Status: DC

## 2013-11-22 MED ORDER — CHLORHEXIDINE GLUCONATE 4 % EX LIQD: 60.0000 mL | Freq: Once | CUTANEOUS | Status: DC

## 2013-11-22 MED ORDER — OXYCODONE HCL 5 MG PO TABS
5.0000 mg | ORAL_TABLET | Freq: Once | ORAL | Status: AC | PRN
  Administered 2013-11-22: 5 mg via ORAL
  Filled 2013-11-22: qty 1

## 2013-11-22 MED ORDER — ONDANSETRON HCL 4 MG/2ML IJ SOLN
INTRAMUSCULAR | Status: DC | PRN
  Administered 2013-11-22: 4 mg via INTRAVENOUS

## 2013-11-22 MED ORDER — OXYCODONE HCL 5 MG/5ML PO SOLN: 5.0000 mg | Freq: Once | ORAL | Status: AC | PRN

## 2013-11-22 MED ORDER — VANCOMYCIN HCL IN DEXTROSE 1-5 GM/200ML-% IV SOLN
1000.0000 mg | INTRAVENOUS | Status: AC
  Administered 2013-11-22: 1000 mg via INTRAVENOUS

## 2013-11-22 MED ORDER — FENTANYL CITRATE 0.05 MG/ML IJ SOLN
INTRAMUSCULAR | Status: DC | PRN
  Administered 2013-11-22: 100 ug via INTRAVENOUS

## 2013-11-22 MED ORDER — FENTANYL CITRATE 0.05 MG/ML IJ SOLN
INTRAMUSCULAR | Status: AC
  Filled 2013-11-22: qty 2

## 2013-11-22 MED ORDER — PROPOFOL 10 MG/ML IV BOLUS
INTRAVENOUS | Status: DC | PRN
  Administered 2013-11-22: 150 mg via INTRAVENOUS

## 2013-11-22 MED ORDER — PANTOPRAZOLE SODIUM 40 MG PO TBEC: 40.0000 mg | DELAYED_RELEASE_TABLET | Freq: Every day | ORAL | Status: DC | PRN

## 2013-11-22 MED ORDER — MIDAZOLAM HCL 2 MG/2ML IJ SOLN
1.0000 mg | INTRAMUSCULAR | Status: DC | PRN
  Administered 2013-11-22: 2 mg via INTRAVENOUS

## 2013-11-22 MED ORDER — LACTATED RINGERS IV SOLN
INTRAVENOUS | Status: DC
Start: 2013-11-22 — End: 2013-11-22
  Administered 2013-11-22 (×2): via INTRAVENOUS

## 2013-11-22 MED ORDER — VANCOMYCIN HCL IN DEXTROSE 1-5 GM/200ML-% IV SOLN
INTRAVENOUS | Status: AC
  Filled 2013-11-22: qty 200

## 2013-11-22 MED ORDER — MIDAZOLAM HCL 2 MG/2ML IJ SOLN
INTRAMUSCULAR | Status: AC
  Filled 2013-11-22: qty 2

## 2013-11-22 MED ORDER — SUCCINYLCHOLINE CHLORIDE 20 MG/ML IJ SOLN
INTRAMUSCULAR | Status: DC | PRN
  Administered 2013-11-22: 100 mg via INTRAVENOUS

## 2013-11-22 MED ORDER — DEXAMETHASONE SODIUM PHOSPHATE 4 MG/ML IJ SOLN
INTRAMUSCULAR | Status: DC | PRN
  Administered 2013-11-22: 4 mg via PERINEURAL
  Administered 2013-11-22: 5 mg via INTRAVENOUS

## 2013-11-22 MED ORDER — SODIUM CHLORIDE 0.9 % IR SOLN
Status: DC | PRN
  Administered 2013-11-22: 1

## 2013-11-22 MED ORDER — SODIUM CHLORIDE 0.9 % IV SOLN
INTRAVENOUS | Status: DC
  Administered 2013-11-22: 10 mL/h via INTRAVENOUS

## 2013-11-22 MED ORDER — FENTANYL CITRATE 0.05 MG/ML IJ SOLN
50.0000 ug | INTRAMUSCULAR | Status: DC | PRN
  Administered 2013-11-22: 100 ug via INTRAVENOUS

## 2013-11-22 MED ORDER — PROPOFOL 10 MG/ML IV BOLUS
INTRAVENOUS | Status: AC
  Filled 2013-11-22: qty 80

## 2013-11-22 MED ORDER — ROPIVACAINE HCL 5 MG/ML IJ SOLN
INTRAMUSCULAR | Status: DC | PRN
  Administered 2013-11-22: 30 mL via PERINEURAL

## 2013-11-22 MED ORDER — OXYCODONE HCL 5 MG PO TABS
5.0000 mg | ORAL_TABLET | ORAL | Status: DC | PRN
Start: 2013-11-22 — End: 2013-11-23
  Administered 2013-11-23: 10 mg via ORAL
  Filled 2013-11-22 (×2): qty 1

## 2013-11-22 MED ORDER — METOCLOPRAMIDE HCL 5 MG/ML IJ SOLN
5.0000 mg | Freq: Three times a day (TID) | INTRAMUSCULAR | Status: DC | PRN
  Administered 2013-11-22: 10 mg via INTRAVENOUS
  Filled 2013-11-22 (×2): qty 2

## 2013-11-22 MED ORDER — VANCOMYCIN HCL IN DEXTROSE 1-5 GM/200ML-% IV SOLN
1000.0000 mg | Freq: Two times a day (BID) | INTRAVENOUS | Status: AC
  Administered 2013-11-22: 1000 mg via INTRAVENOUS

## 2013-11-22 MED ORDER — METOCLOPRAMIDE HCL 5 MG PO TABS: 5.0000 mg | ORAL_TABLET | Freq: Three times a day (TID) | ORAL | Status: DC | PRN

## 2013-11-22 MED ORDER — EPHEDRINE SULFATE 50 MG/ML IJ SOLN
INTRAMUSCULAR | Status: DC | PRN
  Administered 2013-11-22 (×2): 10 mg via INTRAVENOUS
  Administered 2013-11-22: 5 mg via INTRAVENOUS
  Administered 2013-11-22: 15 mg via INTRAVENOUS

## 2013-11-22 MED ORDER — PROMETHAZINE HCL 25 MG PO TABS
25.0000 mg | ORAL_TABLET | Freq: Three times a day (TID) | ORAL | Status: DC | PRN
  Administered 2013-11-22: 25 mg via ORAL
  Filled 2013-11-22: qty 1

## 2013-11-22 SURGICAL SUPPLY — 86 items
ANCH SUT SWLK 19.1 CLS EYLT TL (Anchor) ×1 IMPLANT
ANCH SUT SWLK 19.1X4.75 VT (Anchor) ×2 IMPLANT
ANCHOR PEEK 4.75X19.1 SWLK C (Anchor) ×4 IMPLANT
ANCHOR SUT BIO SW 4.75 W/FIB (Anchor) ×2 IMPLANT
BANDAGE ADH SHEER 1  50/CT (GAUZE/BANDAGES/DRESSINGS) IMPLANT
BLADE 15 SAFETY STRL DISP (BLADE) ×2 IMPLANT
BLADE AVERAGE 25MMX9MM (BLADE)
BLADE AVERAGE 25X9 (BLADE) IMPLANT
BLADE CUTTER MENIS 5.5 (BLADE) IMPLANT
BLADE SURG 15 STRL LF DISP TIS (BLADE) IMPLANT
BLADE SURG 15 STRL SS (BLADE) ×6
BUR EGG 3PK/BX (BURR) IMPLANT
BUR OVAL 6.0 (BURR) ×3 IMPLANT
CANISTER SUCT 3000ML (MISCELLANEOUS) IMPLANT
CANNULA TWIST IN 8.25X7CM (CANNULA) IMPLANT
CLEANER CAUTERY TIP 5X5 PAD (MISCELLANEOUS) IMPLANT
CLOSURE WOUND 1/2 X4 (GAUZE/BANDAGES/DRESSINGS) ×1
CUTTER MENISCUS  4.2MM (BLADE) ×2
CUTTER MENISCUS 4.2MM (BLADE) ×1 IMPLANT
DECANTER SPIKE VIAL GLASS SM (MISCELLANEOUS) IMPLANT
DRAPE INCISE IOBAN 66X45 STRL (DRAPES) ×3 IMPLANT
DRAPE STERI 35X30 U-POUCH (DRAPES) ×3 IMPLANT
DRAPE SURG 17X23 STRL (DRAPES) ×3 IMPLANT
DRAPE U-SHAPE 47X51 STRL (DRAPES) ×3 IMPLANT
DRAPE U-SHAPE 76X120 STRL (DRAPES) ×6 IMPLANT
DRSG PAD ABDOMINAL 8X10 ST (GAUZE/BANDAGES/DRESSINGS) ×3 IMPLANT
DURAPREP 26ML APPLICATOR (WOUND CARE) ×3 IMPLANT
ELECT REM PT RETURN 9FT ADLT (ELECTROSURGICAL) ×3
ELECTRODE REM PT RTRN 9FT ADLT (ELECTROSURGICAL) IMPLANT
GAUZE SPONGE 4X4 12PLY STRL (GAUZE/BANDAGES/DRESSINGS) ×3 IMPLANT
GLOVE BIO SURGEON STRL SZ 6.5 (GLOVE) ×1 IMPLANT
GLOVE BIO SURGEONS STRL SZ 6.5 (GLOVE) ×1
GLOVE BIOGEL M STRL SZ7.5 (GLOVE) ×3 IMPLANT
GLOVE BIOGEL PI IND STRL 7.0 (GLOVE) IMPLANT
GLOVE BIOGEL PI IND STRL 8 (GLOVE) ×2 IMPLANT
GLOVE BIOGEL PI INDICATOR 7.0 (GLOVE) ×2
GLOVE BIOGEL PI INDICATOR 8 (GLOVE) ×4
GLOVE ORTHO TXT STRL SZ7.5 (GLOVE) ×3 IMPLANT
GOWN STRL REUS W/ TWL LRG LVL3 (GOWN DISPOSABLE) IMPLANT
GOWN STRL REUS W/TWL LRG LVL3 (GOWN DISPOSABLE) ×3
GOWN STRL REUS W/TWL XL LVL4 (GOWN DISPOSABLE) ×7 IMPLANT
MANIFOLD NEPTUNE II (INSTRUMENTS) ×3 IMPLANT
NDL SCORPION (NEEDLE) ×1 IMPLANT
NDL SUT 6 .5 CRC .975X.05 MAYO (NEEDLE) IMPLANT
NEEDLE MAYO TAPER (NEEDLE) ×3
NEEDLE MINI RC 24MM (NEEDLE) IMPLANT
NEEDLE SCORPION (NEEDLE) ×3 IMPLANT
PACK ARTHROSCOPY DSU (CUSTOM PROCEDURE TRAY) ×3 IMPLANT
PACK BASIN DAY SURGERY FS (CUSTOM PROCEDURE TRAY) ×3 IMPLANT
PAD CLEANER CAUTERY TIP 5X5 (MISCELLANEOUS)
PASSER SUT SWANSON 36MM LOOP (INSTRUMENTS) IMPLANT
PENCIL BUTTON HOLSTER BLD 10FT (ELECTRODE) ×2 IMPLANT
SLEEVE SCD COMPRESS KNEE MED (MISCELLANEOUS) ×3 IMPLANT
SLING ARM LRG ADULT FOAM STRAP (SOFTGOODS) IMPLANT
SLING ARM MED ADULT FOAM STRAP (SOFTGOODS) IMPLANT
SLING ARM SM FOAM STRAP (SOFTGOODS) ×2 IMPLANT
SPONGE LAP 4X18 X RAY DECT (DISPOSABLE) ×2 IMPLANT
STRIP CLOSURE SKIN 1/2X4 (GAUZE/BANDAGES/DRESSINGS) ×1 IMPLANT
SUCTION FRAZIER TIP 10 FR DISP (SUCTIONS) IMPLANT
SUT FIBERWIRE #2 38 T-5 BLUE (SUTURE)
SUT FIBERWIRE 3-0 18 TAPR NDL (SUTURE)
SUT PROLENE 1 CT (SUTURE) IMPLANT
SUT PROLENE 3 0 PS 2 (SUTURE) ×3 IMPLANT
SUT TIGER TAPE 7 IN WHITE (SUTURE) IMPLANT
SUT VIC AB 0 CT1 27 (SUTURE)
SUT VIC AB 0 CT1 27XBRD ANBCTR (SUTURE) IMPLANT
SUT VIC AB 0 SH 27 (SUTURE) ×2 IMPLANT
SUT VIC AB 2-0 SH 27 (SUTURE) ×3
SUT VIC AB 2-0 SH 27XBRD (SUTURE) IMPLANT
SUT VIC AB 3-0 SH 27 (SUTURE)
SUT VIC AB 3-0 SH 27X BRD (SUTURE) IMPLANT
SUT VIC AB 3-0 X1 27 (SUTURE) IMPLANT
SUTURE FIBERWR #2 38 T-5 BLUE (SUTURE) IMPLANT
SUTURE FIBERWR 3-0 18 TAPR NDL (SUTURE) IMPLANT
SYR 3ML 23GX1 SAFETY (SYRINGE) IMPLANT
SYR BULB 3OZ (MISCELLANEOUS) IMPLANT
TAPE FIBER 2MM 7IN #2 BLUE (SUTURE) ×2 IMPLANT
TAPE PAPER 3X10 WHT MICROPORE (GAUZE/BANDAGES/DRESSINGS) ×3 IMPLANT
TOWEL OR 17X24 6PK STRL BLUE (TOWEL DISPOSABLE) ×3 IMPLANT
TOWEL OR NON WOVEN STRL DISP B (DISPOSABLE) ×2 IMPLANT
TUBE CONNECTING 20'X1/4 (TUBING) ×2
TUBE CONNECTING 20X1/4 (TUBING) ×3 IMPLANT
TUBING ARTHROSCOPY IRRIG 16FT (MISCELLANEOUS) ×3 IMPLANT
WAND STAR VAC 90 (SURGICAL WAND) ×1 IMPLANT
WATER STERILE IRR 1000ML POUR (IV SOLUTION) ×3 IMPLANT
YANKAUER SUCT BULB TIP NO VENT (SUCTIONS) IMPLANT

## 2013-11-22 NOTE — Anesthesia Preprocedure Evaluation (Signed)
Anesthesia Evaluation  Patient identified by MRN, date of birth, ID band Patient awake    Reviewed: Allergy & Precautions, H&P , NPO status , Patient's Chart, lab work & pertinent test results  History of Anesthesia Complications (+) PONV  Airway Mallampati: I TM Distance: >3 FB Neck ROM: Full    Dental   Pulmonary          Cardiovascular     Neuro/Psych    GI/Hepatic   Endo/Other    Renal/GU      Musculoskeletal   Abdominal   Peds  Hematology   Anesthesia Other Findings   Reproductive/Obstetrics                           Anesthesia Physical Anesthesia Plan  ASA: II  Anesthesia Plan: General   Post-op Pain Management:    Induction: Intravenous  Airway Management Planned: Oral ETT  Additional Equipment:   Intra-op Plan:   Post-operative Plan: Extubation in OR  Informed Consent: I have reviewed the patients History and Physical, chart, labs and discussed the procedure including the risks, benefits and alternatives for the proposed anesthesia with the patient or authorized representative who has indicated his/her understanding and acceptance.     Plan Discussed with: CRNA and Surgeon  Anesthesia Plan Comments:         Anesthesia Quick Evaluation

## 2013-11-22 NOTE — Anesthesia Procedure Notes (Addendum)
Anesthesia Regional Block:  Interscalene brachial plexus block  Pre-Anesthetic Checklist: ,, timeout performed, Correct Patient, Correct Site, Correct Laterality, Correct Procedure, Correct Position, site marked, Risks and benefits discussed,  Surgical consent,  Pre-op evaluation,  At surgeon's request and post-op pain management  Laterality: Left  Prep: chloraprep       Needles:  Injection technique: Single-shot  Needle Type: Echogenic Stimulator Needle     Needle Length: 9cm 9 cm Needle Gauge: 21 and 21 G    Additional Needles:  Procedures: ultrasound guided (picture in chart) and nerve stimulator Interscalene brachial plexus block  Nerve Stimulator or Paresthesia:  Response: 0.4 mA,   Additional Responses:   Narrative:  Start time: 11/22/2013 10:10 AM End time: 11/22/2013 10:20 AM Injection made incrementally with aspirations every 5 mL. Anesthesiologist: Lillia Abed MD  Additional Notes: Monitors applied. Patient sedated. Sterile prep and drape,hand hygiene and sterile gloves were used. Relevant anatomy identified.Needle position confirmed.Local anesthetic injected incrementally after negative aspiration. Local anesthetic spread visualized around nerve(s). Vascular puncture avoided. No complications. Image printed for medical record.The patient tolerated the procedure well.

## 2013-11-22 NOTE — Anesthesia Postprocedure Evaluation (Signed)
  Anesthesia Post-op Note  Patient: Kylie Tucker  Procedure(s) Performed: Procedure(s): LEFT SHOULDER ARTHROSCOPY WITH SUBACROMIAL DECOMPRESSION DISTAL CLAVICLE RESECTION REPAIR  ROTATOR CUFF AS NEEDED  (Left)  Patient Location: PACU  Anesthesia Type:GA combined with regional for post-op pain  Level of Consciousness: awake, alert  and oriented  Airway and Oxygen Therapy: Patient Spontanous Breathing and Patient connected to nasal cannula oxygen  Post-op Pain: none  Post-op Assessment: Post-op Vital signs reviewed  Post-op Vital Signs: Reviewed  Last Vitals:  Filed Vitals:   11/22/13 1436  BP:   Pulse: 88  Temp:   Resp: 15    Complications: No apparent anesthesia complications

## 2013-11-22 NOTE — Progress Notes (Signed)
AssistedDr. Ossey with left, ultrasound guided, interscalene  block. Side rails up, monitors on throughout procedure. See vital signs in flow sheet. Tolerated Procedure well.  

## 2013-11-22 NOTE — Brief Op Note (Signed)
11/22/2013  1:29 PM  PATIENT:  Kylie Tucker  63 y.o. 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  SURGEON:  Surgeon(s) and Role:    * Cammie Sickle., MD - Primary  PHYSICIAN ASSISTANT:   ASSISTANTS: Kylie Tucker.A-C   ANESTHESIA:   general  EBL:  Total I/O In: 1500 [I.V.:1500] Out: -   BLOOD ADMINISTERED:none  DRAINS: none   LOCAL MEDICATIONS USED:  Ropivacaine plexus block  SPECIMEN:  No Specimen  DISPOSITION OF SPECIMEN:  N/A  COUNTS:  YES  TOURNIQUET:  * No tourniquets in log *  DICTATION: .Other Dictation: Dictation Number [id number]  PLAN OF CARE: Admit for overnight observation  PATIENT DISPOSITION:  PACU - hemodynamically stable.   Delay start of Pharmacological VTE agent (>24hrs) due to surgical blood loss or risk of bleeding: not applicable

## 2013-11-22 NOTE — Transfer of Care (Signed)
Immediate Anesthesia Transfer of Care Note  Patient: Kylie Tucker  Procedure(s) Performed: Procedure(s): LEFT SHOULDER ARTHROSCOPY WITH SUBACROMIAL DECOMPRESSION DISTAL CLAVICLE RESECTION REPAIR  ROTATOR CUFF AS NEEDED  (Left)  Patient Location: PACU  Anesthesia Type:GA combined with regional for post-op pain  Level of Consciousness: awake, alert , oriented and patient cooperative  Airway & Oxygen Therapy: Patient Spontanous Breathing and Patient connected to face mask oxygen  Post-op Assessment: Report given to PACU RN and Post -op Vital signs reviewed and stable  Post vital signs: Reviewed and stable  Complications: No apparent anesthesia complications

## 2013-11-22 NOTE — Op Note (Signed)
[id number] 

## 2013-11-23 NOTE — Op Note (Signed)
NAMEABRYANNA, Tucker              ACCOUNT NO.:  [id number]  MEDICAL RECORD NO.:  [id number]  LOCATION:                                 FACILITY:  PHYSICIAN:  Youlanda Mighty. Quintel Mccalla, M.D. DATE OF BIRTH:  [birth year]  DATE OF PROCEDURE:  11/22/2013 DATE OF DISCHARGE:                              OPERATIVE REPORT   PREOPERATIVE DIAGNOSES:  Stage II impingement rule out partial-thickness rotator cuff tear right shoulder, and very unfavorable acromioclavicular anatomy, with advanced degenerative arthritis of acromioclavicular joint.  POSTOPERATIVE DIAGNOSES:  Type 3 SLAP lesion with unstable biceps origin, very severe acromioclavicular degenerative arthritis of shoulder with stage III impingement, and full-thickness supraspinatus rotator cuff tear, and extensive 60-70% PASTA tear of supraspinatus and infraspinatus due to chronic stage III impingement, and a Buford complex noted with an anatomic variant of the superior glenohumeral ligament.  OPERATION: 1. Diagnostic arthroscopy, left shoulder confirming the type 3 SLAP     lesion, Buford complex, PASTA tear of supraspinatus-infraspinatus     with full-thickness tear of supraspinatus. 2. Debridement of type 3 SLAP lesion with biceps tenotomy, and     significant debridement of capsulitis and granulation tissue within     the glenohumeral joint. 3. Arthroscopic subacromial decompression with bursectomy,     coracoacromial ligament release, and extensive acromioplasty     leveling the type 3 acromion to a type 1 morphology. 4. Arthroscopic distal clavicle resection. 5. Biceps tenodesis supra pectoral. 6. Repair of supraspinatus rotator cuff tendon and reconstructing the     foot print of infraspinatus with double row technique.  OPERATING SURGEON:  Youlanda Mighty. Mikeya Tomasetti, MD  ASSISTANT:  [name], PA  ANESTHESIA:  General by endotracheal technique supplemented by a ropivacaine and steroid plexus block.  SUPERVISING  ANESTHESIOLOGIST:  Crissie Sickles. Conrad [place], MD  INDICATIONS:  Kylie Tucker is a 63 year old woman employed at [employer].  Kylie Tucker has a complex past medical history.  Kylie Tucker is status post breast cancer treatment with lymph node resection and a history of post mastectomy lymphedema.  Kylie Tucker has reflux, headaches, postoperative nausea and vomiting, and extensive drug intolerances and allergies.  Kylie Tucker recently underwent treatment of a left wrist fracture, and extensor pollicis longus tendinopathy by my partner, Dr. Fredna Tucker. During her treatment, Kylie Tucker reported to Dr. Fredna Tucker that Kylie Tucker was having progressive left shoulder pain.  Plain films demonstrated unfavorable anatomy at the [employer] joint.  Dr. Fredna Tucker suspected a rotator cuff tear.  An MRI was ordered at [place] on [address] in [place].  The films were read remotely by the [facility] radiologist to reveal Kylie Tucker arthritis, and rotator cuff tendinopathy.  Upon over reading the films it was apparent there were considerable problems with the rotator cuff probably a near full-thickness tear, and very severe arthritis at the [facility] joint with a type 3 acromion and marked impingement on the rotator cuff.  There were extensive areas of partial- thickness tearing of the supraspinatus rotator cuff tendon.  Due to Ms. Kylie Tucker's significant impairment, we advised proceeding with diagnostic arthroscopy and appropriate repairs.  After detailed informed consent, Kylie Tucker is brought to the operating room at this time.  Preoperatively, Kylie Tucker was interviewed by Dr. Conrad [place] of Anesthesia.  He noted multiple drug allergies.  He recommended ropivacaine plexus block augmented by steroid too along its duration mostly to allow pain relief due to her multiple analgesic and tenolysis.  Kylie Tucker was noted to have allergies to penicillin and cephalosporins.  We provided 1 g of vancomycin beginning in the holding area.  After detailed informed consent by Dr. Conrad [place], a ropivacaine plexus block was placed  in the holding area providing excellent anesthesia of the left arm and forequarter.  Kylie Tucker left arm was marked as the proper surgical site per protocol with marking pen.  Kylie Tucker was subsequently transferred to room 1 of the [facility] and placed in supine position upon the operating table.  Under Dr. Everlene Other direct supervision, general endotracheal anesthesia was induced followed by careful positioning in the beach-chair position with the aid of a torso and head holder designed for shoulder arthroscopy.  Passive compression devices were applied to her calves to prevent deep vein thrombosis.  All bony prominences were padded.  Her right arm was carefully positioned with the aid of a Mayo stand and pillows for protection of the ulnar nerve.  Care was taken to be sure that no straps or clothing articles were tied around her waist, and midsection of her legs.  A warming blanket was employed.  Kylie Tucker had no difficulties with the IV vancomycin administration.  The left arm and hand were prepped with DuraPrep and draped with impervious arthroscopy drapes.  Following a routine surgical time-out, we proceeded to instrument the shoulder from an anterior approach with a switching stick and placed a scope through the standard posterior viewing portal with blunt technique.  Diagnostic arthroscopy immediately revealed that Kylie Tucker had an unstable type 3 SLAP lesion with a positive peel back test and most of the labrum hanging within the joint.  Kylie Tucker had a Buford complex anteriorly and instability of the anterior-superior glenohumeral ligament origin due to the SLAP lesion.  The middle glenohumeral ligament and inferior glenohumeral ligament were normal. The hyaline articular cartilage surfaces of the humeral head and glenoid were normal.  The posterior labrum was frayed but otherwise stable.  We carefully inspected the supraspinatus and infraspinatus articular surfaces.  There was a  60-70% PASTA tear of the entire supraspinatus posterior to the long head of the biceps and the infraspinatus had about a 60% PASTA tear anteriorly.  There was an area of full-thickness tearing in the watershed zone.  This was documented with the digital camera.  With identification of a type 3 SLAP lesion, we elected to proceed with a biceps tenodesis at age 44.  The cutting cautery was used to release 95% of the biceps tendon, followed by debridement of the greater tuberosity, and the deep surface of the rotator cuff tendons.  Given the fact that we will perform a supra pectoral biceps tenodesis, we elected to repair the rotator cuff through a mini incision splitting the anterior middle thirds of the deltoid.  After completion of her intra-articular debridement.  We then proceeded to subacromial arthroscopy followed by bursectomy, release of coracoacromial ligament, leveling the acromion to a type 1 morphology with removal of large anterior osteophyte, and a very large medial osteophyte at the [facility] joint.  The distal 1 cm of the clavicle was excised, and the meniscus debrided. Hemostasis was achieved with bipolar cautery.  After photographic documentation of the decompression, we proceeded to perform a 2-cm muscle- splitting incision, placed a Chung wrist retractor, identified the tear, identified the long head of the  biceps, completed the biceps tenotomy, and grasped the biceps tendon with 4 passes of a FiberTape. The FiberTape was then passed through the rotator cuff anteriorly in the rotator interval followed by a double diamondback double row repair of the rotator cuff utilizing FiberTape and a medial mattress suture from the medial swivel lock.  Two lateral swivel locks were used to finish the repair.  There was a small dog-ear posteriorly that was repaired by tendon resection and use of a mattress suture of 0 Vicryl.  The subacromial decompression was quite adequate.  The  lateral margin of the acromion was tapered with a hand rasp.  The deltoid split was then repaired with figure-of-eight suture of 0 Vicryl, followed by repair of the skin with 2-0 Vicryl and intradermal 3-0 Prolene.  The scope was replaced in the glenohumeral joint and a final debridement of the biceps stump was accomplished with a suction shaver.  Photographic documentation of the final footprint of rotator cuff repair was accomplished.  We achieved anatomic apposition of the supraspinatus and infraspinatus to their proper footprints on the greater tuberosity. The portals were repaired with intradermal 3-0 Prolene and Steri-Strips. Ms. Foulk was placed in a sling, awakened from general anesthesia and transferred to the recovery room with stable vital signs.  We will admit her to the recovery care center for observation of vital signs, and anticipate providing pain medicine in the form of oxycodone without Tylenol as Kylie Tucker has acetaminophen intolerance.  Kylie Tucker will be provided a second dose of vancomycin 500 mg in the early morning of Nov 23, 2013.     Youlanda Mighty Nakeya Adinolfi, M.D.     RVS/MEDQ  D:  11/22/2013  T:  11/23/2013  Job:  [id number]

## 2013-11-23 NOTE — Telephone Encounter (Signed)
Spoke with patient. Advised patient that Dr.lathrop has contacted Dr.Magrinat but has not yet received a response. Patient states that Dr.Lathrop may have to call the office of Dr.Magrinat and ask to speak with the triage nurse Lowella Bandy) to get a response. Advised would send a message to Dr.Lathrop to let her know and would get back to the patient just as soon as we receive a response from [place]. Patient agreeable.

## 2013-11-23 NOTE — Telephone Encounter (Signed)
Patient is calling regarding the mediation.

## 2013-11-26 ENCOUNTER — Encounter (HOSPITAL_BASED_OUTPATIENT_CLINIC_OR_DEPARTMENT_OTHER): Payer: Self-pay | Admitting: Orthopedic Surgery

## 2013-11-27 NOTE — Telephone Encounter (Signed)
Routing to Dr. Charlies Constable for review.

## 2013-11-27 NOTE — Telephone Encounter (Signed)
Pt is calling to see if Dr. Charlies Constable has talked with Dr.Magrinat yet?

## 2013-11-28 ENCOUNTER — Telehealth (INDEPENDENT_AMBULATORY_CARE_PROVIDER_SITE_OTHER): Payer: Self-pay

## 2013-11-28 NOTE — Telephone Encounter (Signed)
Pt called in stating she had a work related injury to her right arm and ended up having shoulder surgery last week. She has lymphedema in this arm from her breast cancer surgery by Dr Lucia Gaskins. She needs a letter to be faxed to [facility] stating she has to keep her arm wrapped due to her lymphedema. She asks for Glenda to speak to Dr Lucia Gaskins about to see if he will be willing to fax letter to [phone] attn [facility].

## 2013-11-29 ENCOUNTER — Telehealth: Payer: Self-pay | Admitting: *Deleted

## 2013-11-29 ENCOUNTER — Encounter (INDEPENDENT_AMBULATORY_CARE_PROVIDER_SITE_OTHER): Payer: Self-pay | Admitting: Surgery

## 2013-11-29 NOTE — Telephone Encounter (Signed)
I left a message with Dr. Starleen Arms nurse at this time to see if he would be able to address this concern.   Routing to Dr. Charlies Constable, unsure if Dr. Jana Hakim has messaged back?  Left detailed message, okay per designated party release form, to advise message was sent and phone call was made and will update her when receive response.

## 2013-11-29 NOTE — Telephone Encounter (Signed)
Called & spoke to Tracey/Dr. Charlies Constable @ [phone] & informed that Dr Jana Hakim gave OK for localized vaginal estrogen per GYN.

## 2013-11-29 NOTE — Telephone Encounter (Signed)
Patient calling to check on the status of this call. The patient is worried that this is not being addressed. I reassured her we are actively working on getting her an answer according to the notes below.

## 2013-11-29 NOTE — Telephone Encounter (Signed)
Kylie Tucker at Dr. Starleen Arms office called and state that Dr. Jana Hakim is okay with the use of localized estrogen with Kylie Tucker.

## 2013-11-30 ENCOUNTER — Other Ambulatory Visit: Payer: Self-pay | Admitting: Gynecology

## 2013-11-30 DIAGNOSIS — N952 Postmenopausal atrophic vaginitis: Secondary | ICD-10-CM

## 2013-11-30 DIAGNOSIS — N950 Postmenopausal bleeding: Secondary | ICD-10-CM

## 2013-11-30 MED ORDER — ESTRADIOL 10 MCG VA TABS: 1.0000 | ORAL_TABLET | VAGINAL | Status: DC

## 2013-11-30 NOTE — Telephone Encounter (Signed)
Order sent in, pt to use nightly for 2w then twice a week thereafter, i should see her back in 72m to reassess the vagina.  She should call if she has any issues/questions

## 2013-11-30 NOTE — Telephone Encounter (Signed)
Detailed message left per designated party release form on mobile. Instructions and message from Dr. Charlies Constable given.  Advised to call back as needed or if questions.   Routing to provider for final review. Patient agreeable to disposition. Will close encounter

## 2013-12-13 ENCOUNTER — Ambulatory Visit (HOSPITAL_COMMUNITY)
Admission: RE | Admit: 2013-12-13 | Discharge: 2013-12-13 | Disposition: A | Discharge status: Home / Self Care | Payer: Worker's Compensation | Source: Ambulatory Visit | Attending: Surgery | Admitting: Surgery

## 2013-12-13 DIAGNOSIS — F411 Generalized anxiety disorder: Secondary | ICD-10-CM | POA: Insufficient documentation

## 2013-12-13 DIAGNOSIS — I890 Lymphedema, not elsewhere classified: Secondary | ICD-10-CM | POA: Insufficient documentation

## 2013-12-13 DIAGNOSIS — I972 Postmastectomy lymphedema syndrome: Secondary | ICD-10-CM | POA: Insufficient documentation

## 2013-12-13 DIAGNOSIS — IMO0001 Reserved for inherently not codable concepts without codable children: Secondary | ICD-10-CM | POA: Insufficient documentation

## 2013-12-13 DIAGNOSIS — M79609 Pain in unspecified limb: Secondary | ICD-10-CM | POA: Insufficient documentation

## 2013-12-13 NOTE — Evaluation (Signed)
Physical Therapy Evaluation  Patient Details  Name: Kylie Tucker MRN: [id number] Date of Birth: July 18, 1950  Today's Date: 12/13/2013 Time: 3235-5732 PT Time Calculation (min): 75 min Charge:  Evaluation 1520-1549; manual 2025-4270              Visit#: 1 of 12  Re-eval: 01/12/14    Authorization: work comp    Past Medical History:  Past Medical History  Diagnosis Date  . Cellulitis and abscess of upper arm and forearm   . Postmastectomy lymphedema rt side  . History of right mastectomy 2004    chemo/ radiation  . Migraines   . Cancer rt breast    2004/ chemo/ radiation  . Allergy   . Hyperlipidemia   . PONV (postoperative nausea and vomiting)     cannot take zofran  . GERD (gastroesophageal reflux disease)   . Sentinel node   . Anxiety    Past Surgical History:  Past Surgical History  Procedure Laterality Date  . Rt mastectomy  2004    lymph nodes-7-axillary node dissection  . Nasal sinus surgery  1990/2001  . Mandible surgery  1991  . Port-a-cath removal      in and now out  . Nasal sinus surgery    . Mouth surgery    . Orif finger fracture  02/14/2012    Procedure: OPEN REDUCTION INTERNAL FIXATION (ORIF) METACARPAL (FINGER) FRACTURE;  Surgeon: Kylie Sours, MD;  Location: [place];  Service: Orthopedics;  Laterality: Right;  RIGHT FIFTH   . Breast lumpectomy  2004     br bx  . Orif metacarpal fracture  8/13    rt   . Repair extensor tendon Left 05/30/2013    Procedure: RELEASE TRANSPOSITION EXTENSOR POLLICUS LONGUS LEFT WRIST;  Surgeon: Kylie Sours, MD;  Location: [place];  Service: Orthopedics;  Laterality: Left;  . Breast lumpectomy with axillary lymph node dissection    . Carpal tunnel release    . Wrist arthroscopy Left 2014  . Shoulder arthroscopy with rotator cuff repair and subacromial decompression Left 11/22/2013    Procedure: LEFT SHOULDER ARTHROSCOPY WITH SUBACROMIAL DECOMPRESSION DISTAL CLAVICLE RESECTION REPAIR   ROTATOR CUFF AS NEEDED ;  Surgeon: Kylie Tucker., MD;  Location: [place];  Service: Orthopedics;  Laterality: Left;    Subjective Symptoms/Limitations Symptoms: Kylie Tucker states she got hurt at work on 04/30/2014 when a stool went out from under her she became injured putting her Lt arm out to break the fall .  She continued to have pain in her Lt arm and ended up having surgery on her left arm on and opted to have surery on 11/22/2013.  When they went in it was noted that she had a rotator cuff tear.  She therfore has not been able to wrap herself and has come to therpay for decongestive manual techniques   Pertinent History: Kylie Tucker had a Rt mastectomy on 11/13/2002 with 7 lymphnodes removed and 3 postitive.  She recieved chemo and radiation.  She has been using compression wraps since 2005 and wraps at night she has a Reidsleeve but does not like it.   Pain Assessment Currently in Pain?: Yes Pain Score:  (achey from the edema.) Pain Location: Arm Pain Orientation: Right    Balance Screening  fell off stool at work but no loss of balance falls   Assessment    Date 12/13/2013 12/13/2013     left right  MCP 16.20 19.30    wrist 16.1 16.1    4cm 18.20 17.20    8cm 21.60 19.30    12 cm 26.30 22.10    16cm 28.70 23.70    20cm 29.70 24.40    24cm 29.20 25.70    28cm 31.50 27.70    32cm 35.20 31.10    36cm 37.70 33.80    40cm      44cm      48cm      52cm      56cm      58cm      60cm            Sum of squares 8222.14 6482.92 0.00 0.00  Total Volume 2617.189 6301.6010 0 0           Exercise/Treatments   Manual Therapy Manual Therapy: Other (comment) Other Manual Therapy: Pt recieved decongestve manual techniques inculding supraclavicular deep and superfical abdominal.  Pt then had foam cut for UE followed by compression wrapping with multilayer shortstretch bandages and foam.    Physical Therapy Assessment and Plan PT Assessment and  Plan Clinical Impression Statement: PT is normally I in self wrapping to contain her lymphedema in her Lt arm, however she has had rotator cuff surgery on her Rt UE and has been unable to wrap herself.  She had her husband try but it caused increased swelling in her hand.  Pt states she normally does not wrap her fingers or her hand she begins at her wrist and she does not use foam.  Therapist explained the advantage of using foarm and wrappin fingers and hand.  Pt agreed to foam and hand wrapping will wait and see how she does without wrapping the fingers.  Pt will benefit from skilled therapeutic intervention in order to improve on the following deficits: Increased edema Rehab Potential: Good PT Frequency: Min 3X/week PT Duration: 4 weeks PT Treatment/Interventions: Patient/family education;Manual techniques PT Plan: Pt to recieve total decongestive technique     Goals PT Short Term Goals Time to Complete Short Term Goals: 2 weeks PT Short Term Goal 1: Decrease volume by 30% PT Long Term Goals Time to Complete Long Term Goals: 4 weeks PT Long Term Goal 1: Pt volume to be decreased by 60%  Problem List Patient Active Problem List   Diagnosis Date Noted  . Lymphedema of arm 12/13/2013  . Complete rupture of rotator cuff 11/22/2013  . Nipple discharge in female 12/01/2012  . History of breast cancer 11/30/2012  . History of right mastectomy, T1, N1, 11/13/2002.   Marland Kitchen FATIGUE 10/01/2009  . 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 DYSPNEA ON EXERTION 10/01/2009  . HYPERLIPIDEMIA 09/30/2009       Kylie Tucker    Kylie Tucker 12/13/2013, 5:12 PM  Physician Documentation Your signature is required to indicate approval of the treatment plan as stated above.  Please sign and either send electronically or make a copy of this report for your files and return this physician signed original.   Please mark one 1.__approve of plan  2. ___approve of plan with the following  conditions.   ______________________________                                                          _____________________ Physician Signature  Date  

## 2013-12-17 ENCOUNTER — Ambulatory Visit (HOSPITAL_COMMUNITY)
Admission: RE | Admit: 2013-12-17 | Discharge: 2013-12-17 | Disposition: A | Discharge status: Home / Self Care | Payer: 59 | Source: Ambulatory Visit | Attending: Family Medicine | Admitting: Family Medicine

## 2013-12-17 DIAGNOSIS — IMO0001 Reserved for inherently not codable concepts without codable children: Secondary | ICD-10-CM | POA: Insufficient documentation

## 2013-12-17 DIAGNOSIS — I972 Postmastectomy lymphedema syndrome: Secondary | ICD-10-CM | POA: Insufficient documentation

## 2013-12-17 NOTE — Progress Notes (Signed)
Physical Therapy Treatment Patient Details  Name: Kylie Tucker MRN: [id number] Date of Birth: [birth year]  Today's Date: 12/17/2013 Time: [phone] PT Time Calculation (min): 40 min Charge:  Manual [phone] Visit#: 2 of 12  Re-eval: 01/12/14    Authorization: work comp  Subjective: Symptoms/Limitations Symptoms: Kylie Tucker states that she felt the foam was in the way and would rather not wrap her arm with the foam ths time.   Pain Assessment Currently in Pain?: No/denies    Exercise/Treatments     Manual Therapy Other Manual Therapy: Pt recieved decongestive manual techniques including supraclavicular, deepa and superficial abdominal followed bye routing fluid from Rt UE to Lt UE and Rt inguinal nodes.  Decongestive therapy was completed both anteriorly and posteriorly followed by application of tube sleeve and multiple short stretch bandabes from hand to shoulder .   Physical Therapy Assessment and Plan PT Assessment and Plan Clinical Impression Statement: Pt states she sees no difference wrapping UE with or without foam and therefore would prefer to be wrapped without foam.  Pt declined finger wrapping PT Plan: Pt to recieve total decongestive technique     Goals    Problem List Patient Active Problem List   Diagnosis Date Noted  . Lymphedema of arm 12/13/2013  . Complete rupture of rotator cuff 11/22/2013  . Nipple discharge in female 12/01/2012  . History of breast cancer 11/30/2012  . History of right mastectomy, T1, N1, 11/13/2002.   Marland Kitchen FATIGUE 10/01/2009  . 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 DYSPNEA ON EXERTION 10/01/2009  . HYPERLIPIDEMIA 09/30/2009       GP    Leeroy Cha 12/17/2013, 10:43 AM

## 2013-12-21 ENCOUNTER — Ambulatory Visit (HOSPITAL_COMMUNITY)
Admission: RE | Admit: 2013-12-21 | Discharge: 2013-12-21 | Disposition: A | Discharge status: Home / Self Care | Payer: 59 | Source: Ambulatory Visit | Attending: Family Medicine | Admitting: Family Medicine

## 2013-12-21 NOTE — Progress Notes (Signed)
Physical Therapy Treatment Patient Details  Name: Kylie Tucker MRN: [id number] Date of Birth: [birth year]  Today's Date: 12/21/2013 Time: 3335-4562 PT Time Calculation (min): 45 min Charge:  Manual 5638-9373 Visit#: 3 of 23  Re-eval: 01/12/14    Authorization: work comp  Subjective: Symptoms/Limitations Symptoms: Pt hurting in her Rt shoulder (recent rotator cuff repair)   Exercise/Treatments      Manual Therapy Other Manual Therapy: Pt recieved decongestive manual techniques including supraclavicular, deepa and superficial abdominal followed bye routing fluid from Rt UE to Lt UE and Rt inguinal nodes.  Decongestive therapy was completed both anteriorly and posteriorly followed by application of tube sleeve and multiple short stretch bandabes from hand to shoulder .   Physical Therapy Assessment and Plan PT Assessment and Plan Clinical Impression Statement: Pt states that the wrap was a little too tight but she managed to keep it on until yesterday.  Pt was only able to come two times this week due to schedule conflicts.  No significant chance in edma noted.  PT Plan: give pt sheet on self massage if she does not already have one        Problem List Patient Active Problem List   Diagnosis Date Noted  . Lymphedema of arm 12/13/2013  . Complete rupture of rotator cuff 11/22/2013  . Nipple discharge in female 12/01/2012  . History of breast cancer 11/30/2012  . History of right mastectomy, T1, N1, 11/13/2002.   Marland Kitchen FATIGUE 10/01/2009  . 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 DYSPNEA ON EXERTION 10/01/2009  . HYPERLIPIDEMIA 09/30/2009       GP    RUSSELL,CINDY 12/21/2013, 5:19 PM

## 2013-12-25 ENCOUNTER — Ambulatory Visit (HOSPITAL_COMMUNITY)
Admission: RE | Admit: 2013-12-25 | Discharge: 2013-12-25 | Disposition: A | Discharge status: Home / Self Care | Payer: 59 | Source: Ambulatory Visit | Attending: Family Medicine | Admitting: Family Medicine

## 2013-12-25 NOTE — Progress Notes (Signed)
Physical Therapy Treatment Patient Details  Name: Kylie Tucker MRN: [id number] Date of Birth: 1950/11/02  Today's Date: 12/25/2013 Time: 1583-0940 PT Time Calculation (min): 42 min  Visit#: 4 of 23  Re-eval: 01/12/14 Authorization: work comp  Charges:  Manual 40  Subjective: Symptoms/Limitations Symptoms: Pt states she is unable to use her pump due to hard to get on with her Lt UE.  States no pain or complaints. Pain Assessment Currently in Pain?: No/denies   Objective: Manual Therapy Other Manual Therapy: Pt received decongestive manual techniques including supraclavicular, deep and superficial abdominal node clearance followed by routing fluid from Rt UE to Lt axillary and Rt inguinal nodes. Decongestive therapy was completed both anteriorly and posteriorly followed by application of lotion, #7 netting, cellona foam and 5 layers of short stretch bandages from hand to shoulder   Physical Therapy Assessment and Plan PT Assessment and Plan Clinical Impression Statement: Pt shown the newer reidsleeve nighttime garment.  Pt interested in ordering new one; [employer] contacted and order to be faxed to MD.  Pt refused foam use but used cellona padding prior to short stretch bandaging.  PT Plan: Continue manual lymph drainage and bandaging; measure next visit and check on orders for reidsleeve.     Problem List Patient Active Problem List   Diagnosis Date Noted  . Lymphedema of arm 12/13/2013  . Complete rupture of rotator cuff 11/22/2013  . Nipple discharge in female 12/01/2012  . History of breast cancer 11/30/2012  . History of right mastectomy, T1, N1, 11/13/2002.   Marland Kitchen FATIGUE 10/01/2009  . 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 DYSPNEA ON EXERTION 10/01/2009  . HYPERLIPIDEMIA 09/30/2009      Teena Irani, PTA/CLT 12/25/2013, 4:52 PM

## 2013-12-28 ENCOUNTER — Ambulatory Visit (HOSPITAL_COMMUNITY)
Admission: RE | Admit: 2013-12-28 | Discharge: 2013-12-28 | Disposition: A | Discharge status: Home / Self Care | Payer: 59 | Source: Ambulatory Visit | Attending: Family Medicine | Admitting: Family Medicine

## 2013-12-28 NOTE — Progress Notes (Signed)
Physical Therapy Treatment Patient Details  Name: Kylie Tucker MRN: [id number] Date of Birth: 30-Nov-1950  Today's Date: 12/28/2013 Time: 1520-1607 PT Time Calculation (min): 47 min Charge:  Manual 2440-1027 Visit#: 5 of 12  Re-eval: 01/12/14    Authorization: work comp  Subjective: Symptoms/Limitations Symptoms: Pt states when she took her bandages off she had a scratch on her arm.feels as it maybe from the netting the department uses therefore she brought her own. Pain Assessment Currently in Pain?: No/denies     Exercise/Treatments   Manual Therapy Other Manual Therapy: Pt recieved manual decongestive techniques including supraclavicular, deep and superficial abdominal followed by routing fluid from Rt UE to Lt UE and Rt inguinal nodes.  Decongestive therapy was completed both anteriorly and posteriorly followed by application of tube sleeve and multiple short stretch bandabes from hand to shoulder .   Physical Therapy Assessment and Plan PT Assessment and Plan Clinical Impression Statement: Pt continues to have greatest congestion in upper arm and back.  Pt response favorably with manual. PT Plan: Measure next visti.     Goals  progressing  Problem List Patient Active Problem List   Diagnosis Date Noted  . Lymphedema of arm 12/13/2013  . Complete rupture of rotator cuff 11/22/2013  . Nipple discharge in female 12/01/2012  . History of breast cancer 11/30/2012  . History of right mastectomy, T1, N1, 11/13/2002.   Marland Kitchen FATIGUE 10/01/2009  . 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 DYSPNEA ON EXERTION 10/01/2009  . HYPERLIPIDEMIA 09/30/2009       GP    RUSSELL,CINDY 12/28/2013, 4:51 PM

## 2013-12-31 ENCOUNTER — Ambulatory Visit (HOSPITAL_COMMUNITY)
Admission: RE | Admit: 2013-12-31 | Discharge: 2013-12-31 | Disposition: A | Discharge status: Home / Self Care | Payer: 59 | Source: Ambulatory Visit | Attending: Family Medicine | Admitting: Family Medicine

## 2013-12-31 NOTE — Progress Notes (Addendum)
Physical Therapy Treatment Patient Details  Name: JESSABELLE MARKIEWICZ MRN: [id number] Date of Birth: [birth year]  Today's Date: 12/31/2013 Time: 5883-2549 PT Time Calculation (min): 10 min Charge:  Manual 1520-1607 Visit#: 6 of 12  Re-eval: 01/12/14    Authorization: work comp  Subjective: Symptoms/Limitations Symptoms: Pt states she can not seem to get the upper swelling from her arm out   Exercise/Treatments Date 12/13/2013 12/13/2013 12/31/2013   left right   MCP 16.20 19.30 19.2  wrist 16.1 16.1 16.20  4cm 18.20 17.20 18.70  8cm 21.60 19.30 22.00  12 cm 26.30 22.10 26.30  16cm 28.70 23.70 28.40  20cm 29.70 24.40 30.20  24cm 29.20 25.70 30.70  28cm 31.50 27.70 33.30  32cm 35.20 31.10 36.00  36cm 37.70 33.80 38.30  40cm     44cm     48cm     52cm     56cm     58cm     60cm          Sum of squares 8222.14 6482.92 8689.33  Total Volume 2617.189 8264.1583 2765.9006       Numerator C23-F23    Demominator C23-B23         Answer B25/B26                                      Pt received manual lymph techniques routing fluid from Rt axillary to Lt axillary and Rt inguinal area.  Pt arm was then wrapped with cotton and short stretch bandages.    Physical Therapy Assessment and Plan PT Assessment and Plan Clinical Impression Statement: Pt has acturally gained fluid, (may be secondary to weather being very hot and humid ).  Therapist to apporach the possibilty of using foam to assist with decongestion again. PT Plan: request the use of foam,(possibly cut  1/2 inch foam for increased comfort).     Problem List Patient Active Problem List   Diagnosis Date Noted  . Lymphedema of arm 12/13/2013  . Complete rupture of rotator cuff 11/22/2013  . Nipple discharge in female 12/01/2012  . History of breast cancer 11/30/2012  . History of right mastectomy, T1, N1, 11/13/2002.   Marland Kitchen FATIGUE 10/01/2009  . 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 DYSPNEA ON EXERTION 10/01/2009  .  HYPERLIPIDEMIA 09/30/2009       GP    RUSSELL,CINDY 12/31/2013, 5:02 PM

## 2014-01-02 ENCOUNTER — Telehealth (HOSPITAL_COMMUNITY): Payer: Self-pay

## 2014-01-02 ENCOUNTER — Ambulatory Visit (HOSPITAL_COMMUNITY): Payer: Self-pay | Admitting: Physical Therapy

## 2014-01-04 ENCOUNTER — Ambulatory Visit (HOSPITAL_COMMUNITY)
Admission: RE | Admit: 2014-01-04 | Discharge: 2014-01-04 | Disposition: A | Discharge status: Home / Self Care | Payer: 59 | Source: Ambulatory Visit | Attending: Family Medicine | Admitting: Family Medicine

## 2014-01-04 NOTE — Progress Notes (Signed)
Physical Therapy Treatment Patient Details  Name: Kylie Tucker MRN: [id number] Date of Birth: 02-20-1951  Today's Date: 01/04/2014 Time: 1100-1155 PT Time Calculation (min): 55 min Charge  Manual 1100-1145 Visit#: 7 of 12  Re-eval: 01/12/14   Subjective: Symptoms/Limitations Symptoms: Discussed with pt the benefits of using foam  to decrease swelling.  Therapist and pt agreed to try 1/2" foam to see if this was more comfortable for the pt.  Pt refuses to have hand wrapped stating that it never swells and she does not use a hand gaunlet.        Exercise/Treatments  Manual Therapy Other Manual Therapy: Pt recieved manual decongestive techniques including supraclavicular,deep and superficial abdominal followed by routing fluid via intraaxillary and Rt axillary-inguinal anastomsis.  Foam was cut to fit pt arm.  Pt arm was then wrapped with foam and multilayer stretch bandages   Physical Therapy Assessment and Plan PT Assessment and Plan Clinical Impression Statement: Pt agreed to try foam again to see if this brings swelling down.  Pt told to take two bandages off if she notices her hand swelling.  PT Plan: Assess how 1/2" foam does for pt.        Problem List Patient Active Problem List   Diagnosis Date Noted  . Lymphedema of arm 12/13/2013  . Complete rupture of rotator cuff 11/22/2013  . Nipple discharge in female 12/01/2012  . History of breast cancer 11/30/2012  . History of right mastectomy, T1, N1, 11/13/2002.   Marland Kitchen FATIGUE 10/01/2009  . 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 DYSPNEA ON EXERTION 10/01/2009  . HYPERLIPIDEMIA 09/30/2009       GP    RUSSELL,CINDY 01/04/2014, 4:45 PM

## 2014-01-07 ENCOUNTER — Telehealth (HOSPITAL_COMMUNITY): Payer: Self-pay

## 2014-01-14 ENCOUNTER — Ambulatory Visit (HOSPITAL_COMMUNITY): Payer: Self-pay | Admitting: Physical Therapy

## 2014-01-16 ENCOUNTER — Ambulatory Visit (HOSPITAL_COMMUNITY)
Admission: RE | Admit: 2014-01-16 | Discharge: 2014-01-16 | Disposition: A | Discharge status: Home / Self Care | Payer: Worker's Compensation | Source: Ambulatory Visit | Attending: Family Medicine | Admitting: Family Medicine

## 2014-01-16 DIAGNOSIS — I972 Postmastectomy lymphedema syndrome: Secondary | ICD-10-CM | POA: Diagnosis not present

## 2014-01-16 DIAGNOSIS — IMO0001 Reserved for inherently not codable concepts without codable children: Secondary | ICD-10-CM | POA: Diagnosis present

## 2014-01-16 NOTE — Progress Notes (Addendum)
Physical Therapy Treatment Patient Details  Name: Kylie Tucker MRN: [id number] Date of Birth: 01-08-1951  Today's Date: 01/16/2014 Time: 9735-3299 PT Time Calculation (min): 40 min Visit#: 8 of 12  Re-eval: 01/12/14 Charges:  Manual 38   Subjective: Symptoms/Limitations Symptoms: Pt states she left her house and forgot her bandages today.  Pt states she got a call from [facility] regarding her reidsleeve and is unsure if they wanted the therapist to measure.  Contacted Samantha. Pain Assessment Currently in Pain?: No/denies   Objective: Manual Therapy Other Manual Therapy: Pt recieved manual decongestive techniques including supraclavicular,deep and superficial abdominal followed by routing fluid via intraaxillary and Rt axillary-inguinal anastomsis.    Physical Therapy Assessment and Plan PT Assessment and Plan Clinical Impression Statement: Did not hear back from [facility] prior to patient leaving.  PT reported she can tell notable decrease in edema and induration in Rt UE.  Pt instructed to bandage her Rt UE when returns home.  Pt verbalized understanding. PT Plan: Continue manual lymph draiange and bandaging.      Problem List Patient Active Problem List   Diagnosis Date Noted  . Lymphedema of arm 12/13/2013  . Complete rupture of rotator cuff 11/22/2013  . Nipple discharge in female 12/01/2012  . History of breast cancer 11/30/2012  . History of right mastectomy, T1, N1, 11/13/2002.   Marland Kitchen FATIGUE 10/01/2009  . 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 DYSPNEA ON EXERTION 10/01/2009  . HYPERLIPIDEMIA 09/30/2009      Teena Irani, PTA/CLT 01/16/2014, 12:05 PM

## 2014-01-18 ENCOUNTER — Ambulatory Visit (HOSPITAL_COMMUNITY)
Admission: RE | Admit: 2014-01-18 | Discharge: 2014-01-18 | Disposition: A | Discharge status: Home / Self Care | Payer: Worker's Compensation | Source: Ambulatory Visit | Attending: Family Medicine | Admitting: Family Medicine

## 2014-01-18 DIAGNOSIS — IMO0001 Reserved for inherently not codable concepts without codable children: Secondary | ICD-10-CM | POA: Diagnosis not present

## 2014-01-18 NOTE — Progress Notes (Signed)
Physical Therapy Treatment Patient Details  Name: Kylie Tucker MRN: [id number] Date of Birth: 08-01-1950  Today's Date: 01/18/2014 Time: 0930-1015 PT Time Calculation (min): 45 min Visit#: 9 of 12  Re-eval: 01/12/14 Charges:  Manual 45     Subjective: Symptoms/Limitations Symptoms: Pt comes today with her bandages.  States her elbow continues to be the area of most concern.   Pain Assessment Currently in Pain?: No/denies  Objective: Manual Therapy Other Manual Therapy: Pt received manual decongestive techniques including supraclavicular,deep and superficial abdominal followed by routing fluid via interinguinal (Rt to Lt)  and Rt axillo-inguinal pathways. Rt UE was then wrapped with 1/2" foam and multilayer short stretch bandages from wrist to shoulder.  Physical Therapy Assessment and Plan PT Assessment and Plan Clinical Impression Statement: Pt requests hand and fingers not be bandaged today.  Rt UE continues to decompress with current therapy.  More cotton was placed into elbow region today.  Overall comfort voiced following bandaging.   PT Plan: Continue manual lymph draiange and bandaging. Follow up with contact from [employer] for reidsleeve.     Problem List Patient Active Problem List   Diagnosis Date Noted  . Lymphedema of arm 12/13/2013  . Complete rupture of rotator cuff 11/22/2013  . Nipple discharge in female 12/01/2012  . History of breast cancer 11/30/2012  . History of right mastectomy, T1, N1, 11/13/2002.   Marland Kitchen FATIGUE 10/01/2009  . 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 DYSPNEA ON EXERTION 10/01/2009  . HYPERLIPIDEMIA 09/30/2009      Teena Irani, PTA/CLT 01/18/2014, 11:18 AM

## 2014-01-21 ENCOUNTER — Ambulatory Visit (HOSPITAL_COMMUNITY)
Admission: RE | Admit: 2014-01-21 | Discharge: 2014-01-21 | Disposition: A | Discharge status: Home / Self Care | Payer: Worker's Compensation | Source: Ambulatory Visit | Attending: Family Medicine | Admitting: Family Medicine

## 2014-01-21 DIAGNOSIS — IMO0001 Reserved for inherently not codable concepts without codable children: Secondary | ICD-10-CM | POA: Diagnosis not present

## 2014-01-21 NOTE — Progress Notes (Signed)
Physical Therapy Treatment Patient Details  Name: Kylie Tucker MRN: [id number] Date of Birth: Feb 23, 1951  Today's Date: 01/21/2014 Time: 1025-1115 PT Time Calculation (min): 50 min Charge:  Manual 1025-1115. Visit#: 10 of 12  Re-eval: 01/12/14   Authorization: work comp     Subjective: Symptoms/Limitations Symptoms: Pt states that the 1/2" foam worked better for her than the 1/4"  able to keep last wraps on until Sunday.   Exercise/Treatments     12/13/2013 12/13/2013 12/31/2013 01/21/2014  left right right right  16.20 19.30 19.2 18.8  16.1 16.1 16.20 16.00  18.20 17.20 18.70 19.00  21.60 19.30 22.00 21.80  26.30 22.10 26.30 24.90  28.70 23.70 28.40 28.50  29.70 24.40 30.20 28.30  29.20 25.70 30.70 28.50  31.50 27.70 33.30 31.50  35.20 31.10 36.00 35.00  37.70 33.80 38.00 36.50                                          82 22.14 6482.92 8666.44 8040.58  2617.189 5701.7793 2758.6145 9030.092    Manual Therapy Other Manual Therapy: Pt recieved manual decongestive techniques including supraclavicular, deep and superficial abdominal; routing fluid from Rt UE to Lt using intraxillary anastomosis as well as routing Rt axiallary to Rt inguinal using both anterior and posterior anastomosis.  Pt was then wrapped from wrist to axillary area using 1/2" foam and multilayer short stretch bandages.    Physical Therapy Assessment and Plan PT Assessment and Plan Clinical Impression Statement: Pt has decongested since we began using 1/2' foam but is still above original measurements.  Will continue with manual and bandaging .    Goals  progressing   Problem List Patient Active Problem List   Diagnosis Date Noted  . Lymphedema of arm 12/13/2013  . Complete rupture of rotator cuff 11/22/2013  . Nipple discharge in female 12/01/2012  . History of breast cancer 11/30/2012  . History of right mastectomy, T1, N1, 11/13/2002.   Marland Kitchen FATIGUE 10/01/2009  . 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  DYSPNEA ON EXERTION 10/01/2009  . HYPERLIPIDEMIA 09/30/2009       GP    RUSSELL,CINDY 01/21/2014, 11:26 AM

## 2014-01-23 ENCOUNTER — Ambulatory Visit (HOSPITAL_COMMUNITY)
Admission: RE | Admit: 2014-01-23 | Discharge: 2014-01-23 | Disposition: A | Discharge status: Home / Self Care | Payer: Worker's Compensation | Source: Ambulatory Visit | Attending: Family Medicine | Admitting: Family Medicine

## 2014-01-23 DIAGNOSIS — IMO0001 Reserved for inherently not codable concepts without codable children: Secondary | ICD-10-CM | POA: Diagnosis not present

## 2014-01-23 NOTE — Progress Notes (Signed)
Physical Therapy Treatment Patient Details  Name: Kylie Tucker MRN: [id number] Date of Birth: [birth year]  Today's Date: 01/23/2014 Time: 3235-5732 PT Time Calculation (min): 47 min Visit#: 11 of 12  Re-eval: 01/12/14 Charges: manual 45  Subjective: Symptoms/Limitations Symptoms: Pt states she is doing well.  States she can tell the bandaging is helping to decompress her Rt LE.  Pt states she got a good workout on her Lt UE in therapy last time and it was a little sore. Pain Assessment Currently in Pain?: No/denies   Objective: Manual Therapy Other Manual Therapy: Pt recieved manual decongestive techniques including supraclavicular, deep and superficial abdominal; routing fluid from Rt UE to Lt using intraxillary anastomosis as well as routing Rt axiallary to Rt inguinal using both anterior and posterior anastomosis. Pt was then wrapped from wrist to axillary area using 1/2" foam and multilayer short stretch bandages  Physical Therapy Assessment and Plan PT Assessment and Plan Clinical Impression Statement: Pt continues to respond well to manual lymph drainage and bandaging.  No swelling noted into hand but will keep check on this.  Message sent to [facility] regarding reidsleeve to follow up with patient.   PT Plan: Continue manual lymph draiange and bandaging. Pt needs re-evaluation (due on 01/12/14).  Measure bilateral UE's next visit.      Problem List Patient Active Problem List   Diagnosis Date Noted  . Lymphedema of arm 12/13/2013  . Complete rupture of rotator cuff 11/22/2013  . Nipple discharge in female 12/01/2012  . History of breast cancer 11/30/2012  . History of right mastectomy, T1, N1, 11/13/2002.   Marland Kitchen FATIGUE 10/01/2009  . 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 DYSPNEA ON EXERTION 10/01/2009  . HYPERLIPIDEMIA 09/30/2009         Teena Irani, PTA/CLT 01/23/2014, 4:20 PM

## 2014-01-25 ENCOUNTER — Ambulatory Visit (HOSPITAL_COMMUNITY)
Admission: RE | Admit: 2014-01-25 | Discharge: 2014-01-25 | Disposition: A | Discharge status: Home / Self Care | Payer: Worker's Compensation | Source: Ambulatory Visit | Attending: Family Medicine | Admitting: Family Medicine

## 2014-01-25 DIAGNOSIS — IMO0001 Reserved for inherently not codable concepts without codable children: Secondary | ICD-10-CM | POA: Diagnosis not present

## 2014-01-25 NOTE — Evaluation (Signed)
Physical Therapy Evaluation  Patient Details  Name: Kylie Tucker MRN: [id number] Date of Birth: Apr 12, 1951  Today's Date: 01/25/2014 Time: 1120-1230 PT Time Calculation (min): 23 min Charge:  Manual 1031-5945             Visit#: 12 of 12  Re-eval: 02/24/14 Assessment Diagnosis: Lymphedema Rt LE   Authorization: work comp    Past Medical History:  Past Medical History  Diagnosis Date  . Cellulitis and abscess of upper arm and forearm   . Postmastectomy lymphedema rt side  . History of right mastectomy 2004    chemo/ radiation  . Migraines   . Cancer rt breast    2004/ chemo/ radiation  . Allergy   . Hyperlipidemia   . PONV (postoperative nausea and vomiting)     cannot take zofran  . GERD (gastroesophageal reflux disease)   . Sentinel node   . Anxiety    Past Surgical History:  Past Surgical History  Procedure Laterality Date  . Rt mastectomy  2004    lymph nodes-7-axillary node dissection  . Nasal sinus surgery  1990/2001  . Mandible surgery  1991  . Port-a-cath removal      in and now out  . Nasal sinus surgery    . Mouth surgery    . Orif finger fracture  02/14/2012    Procedure: OPEN REDUCTION INTERNAL FIXATION (ORIF) METACARPAL (FINGER) FRACTURE;  Surgeon: Wynonia Sours, MD;  Location: [place];  Service: Orthopedics;  Laterality: Right;  RIGHT FIFTH   . Breast lumpectomy  2004     br bx  . Orif metacarpal fracture  8/13    rt   . Repair extensor tendon Left 05/30/2013    Procedure: RELEASE TRANSPOSITION EXTENSOR POLLICUS LONGUS LEFT WRIST;  Surgeon: Wynonia Sours, MD;  Location: [place];  Service: Orthopedics;  Laterality: Left;  . Breast lumpectomy with axillary lymph node dissection    . Carpal tunnel release    . Wrist arthroscopy Left 2014  . Shoulder arthroscopy with rotator cuff repair and subacromial decompression Left 11/22/2013    Procedure: LEFT SHOULDER ARTHROSCOPY WITH SUBACROMIAL DECOMPRESSION DISTAL CLAVICLE  RESECTION REPAIR  ROTATOR CUFF AS NEEDED ;  Surgeon: Cammie Sickle., MD;  Location: [place];  Service: Orthopedics;  Laterality: Left;    Subjective Symptoms/Limitations Symptoms: Pt  states that the last time she was wrapped by the time she got home she was uncomfortable and a few hours latter she noted increased swelling in her hand.  She took the bandages off and noted she had increased swelling in her whole arm.  She had her husband rewrap her arm but she is concerned now and would like to be wrapped from the hand to the axilla instead of wrist to axilla. Pain Assessment Currently in Pain?: No/denies (feels heavy)  Assessment    Date 12/13/2013 12/13/2013 12/31/2013 01/21/2014 01/25/2014   right left right right left.  MCP 16.20 19.30 19.2 18.8 19  wrist 16.1 16.1 16.20 16.00 16.40  4cm 18.20 17.20 18.70 19.00 17.10  8cm 21.60 19.30 22.00 21.80 19.30  12 cm 26.30 22.10 26.30 24.90 22.30  16cm 28.70 23.70 28.40 28.50 23.90  20cm 29.70 24.40 30.20 28.30 24.50  24cm 29.20 25.70 30.70 28.50 25.30  28cm 31.50 27.70 33.30 31.50 27.50  32cm 35.20 31.10 36.00 35.00 31.50  36cm 37.70 33.80 38.00 36.50 34.10  40cm       44cm  48cm       52cm       56cm       58cm       60cm              Sum of squares 8222.14 6482.92 8666.44 8040.58 6515.01  Total Volume 2617.189 8921.1941 2758.6145 7408.144 8185.6314            Exercise/Treatments  Manual Therapy Other Manual Therapy: Pt recieved manual decongestive techniques including supraclavicular, deep and superficial abdominal; routing fluid from Rt UE to Lt using intraxillary anastomosis as well as routing Rt axiallary to Rt inguinal using both anterior and posterior anastomosis. Pt was then wrapped from hand  to axillary area using 1/2" foam and multilayer short stretch bandages  Physical Therapy Assessment and Plan PT Assessment and Plan Clinical Impression Statement: Pt had increased swelling in fingers hand and  arm today compared to Monday.  Will assess how pt does with wrapping from hand to axilla.  Pt was continuing to increase in volume of Rt UE until therapist added foam to wrapping,(initally pt refused foam with bandaging techniques).  Once foam was added in volume of LE volume had decreased 199.22 CC from max amount but only 57.79 cc since initial visit.  Will concentrate on upper arm and posterior thorax with manual as this appears to be where the majority of swelling is.  Re-measure next week if volume has not continued to decreased suggest referral back to MD to assess.  PT Plan: continue with total decongestive techniques.     Goals PT Short Term Goals Time to Complete Short Term Goals: 2 weeks PT Short Term Goal 1: Decrease volume by 30% PT Short Term Goal 1 - Progress: Progressing toward goal PT Long Term Goals Time to Complete Long Term Goals: 4 weeks PT Long Term Goal 1: Pt volume to be decreased by 60% PT Long Term Goal 1 - Progress: Progressing toward goal  Problem List Patient Active Problem List   Diagnosis Date Noted  . Lymphedema of arm 12/13/2013  . Complete rupture of rotator cuff 11/22/2013  . Nipple discharge in female 12/01/2012  . History of breast cancer 11/30/2012  . History of right mastectomy, T1, N1, 11/13/2002.   Marland Kitchen FATIGUE 10/01/2009  . 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 DYSPNEA ON EXERTION 10/01/2009  . HYPERLIPIDEMIA 09/30/2009       GP    Opel Lejeune,CINDY 01/25/2014, 3:48 PM  Physician Documentation Your signature is required to indicate approval of the treatment plan as stated above.  Please sign and either send electronically or make a copy of this report for your files and return this physician signed original.   Please mark one 1.__approve of plan  2. ___approve of plan with the following conditions.   ______________________________                                                          _____________________ Physician Signature  Date  

## 2014-01-28 ENCOUNTER — Ambulatory Visit (HOSPITAL_COMMUNITY)
Admission: RE | Admit: 2014-01-28 | Discharge: 2014-01-28 | Disposition: A | Discharge status: Home / Self Care | Payer: Worker's Compensation | Source: Ambulatory Visit | Attending: Family Medicine | Admitting: Family Medicine

## 2014-01-28 DIAGNOSIS — IMO0001 Reserved for inherently not codable concepts without codable children: Secondary | ICD-10-CM | POA: Diagnosis not present

## 2014-01-28 NOTE — Progress Notes (Addendum)
Physical Therapy Treatment Patient Details  Name: KYLIEE ORTEGO MRN: [id number] Date of Birth: 16-Nov-1950  Today's Date: 01/28/2014 Time: 1020-1120 PT Time Calculation (min): 60 min  Visit#: 13 of 24  Re-eval: 02/26/14 Authorization: work comp  Charges:  Manual 50  Subjective: Symptoms/Limitations Symptoms: Pt states she was unable to keep the bandages on, having to remove them by the afternoon due to tightness in the hand and fingers.  Pt states her fingers began turning purple.  Continued congestion in the elbow and now into the wrist and hands.  Pain Assessment Currently in Pain?: No/denies   Objective: Manual Therapy Pt received manual decongestive techniques including short neck, deep and superficial abdominal followed by routing fluid interaxillary  (Rt to Lt) and Rt axillo-inguinal pathways.  Rt UE was then wrapped with 1/2" foam and multilayer short stretch bandages from base of hand to shoulder   Physical Therapy Assessment and Plan PT Assessment and Plan Clinical Impression Statement: Added 1/2" foam to dorsum of hand to increase decompression/edema reduction.  Discussed possiblitity of wrapping fingers too next visit if does not decompress.  Cotton foam wrapped at wrists and elbows to cushion bony prominences and encourage even compression.  PT Plan: continue with total decongestive techniques. Assess effectiveness of adding foam to dorsum     Problem List Patient Active Problem List   Diagnosis Date Noted  . Lymphedema of arm 12/13/2013  . Complete rupture of rotator cuff 11/22/2013  . Nipple discharge in female 12/01/2012  . History of breast cancer 11/30/2012  . History of right mastectomy, T1, N1, 11/13/2002.   Marland Kitchen FATIGUE 10/01/2009  . 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 DYSPNEA ON EXERTION 10/01/2009  . HYPERLIPIDEMIA 09/30/2009       Teena Irani, PTA/CLT 01/28/2014, 3:27 PM

## 2014-01-30 ENCOUNTER — Ambulatory Visit (HOSPITAL_COMMUNITY)
Admission: RE | Admit: 2014-01-30 | Discharge: 2014-01-30 | Disposition: A | Discharge status: Home / Self Care | Payer: Worker's Compensation | Source: Ambulatory Visit | Attending: Family Medicine | Admitting: Family Medicine

## 2014-01-30 ENCOUNTER — Ambulatory Visit (HOSPITAL_COMMUNITY): Payer: Self-pay | Admitting: Physical Therapy

## 2014-01-30 DIAGNOSIS — IMO0001 Reserved for inherently not codable concepts without codable children: Secondary | ICD-10-CM | POA: Diagnosis not present

## 2014-01-30 NOTE — Progress Notes (Signed)
Physical Therapy Treatment Patient Details  Name: Kylie Tucker MRN: [id number] Date of Birth: 02/10/1951  Today's Date: 01/30/2014 Time: 1520-1610 PT Time Calculation (min): 50 min Visit#: 14 of 24  Re-eval: 02/26/14 Charges:  Manual 48'  Subjective: Symptoms/Limitations Symptoms: Pt states the bandages were much more comfortable and was able to keep them on. Pain Assessment Currently in Pain?: No/denies   Objective: Manual Therapy Manual Therapy: Other (comment) Other Manual Therapy: Pt received manual decongestive techniques including short neck, deep and superficial abdominal followed by routing fluid interaxillary (Rt to Lt) and Rt axillo-inguinal pathways. Rt UE was then wrapped with 1/2" foam and multilayer short stretch bandages from base of hand to shoulder  Physical Therapy Assessment and Plan PT Assessment and Plan Clinical Impression Statement: PT required more cotton around thumb and edge of hand to decrease pressure. Overall improvment without edema into fingers and hand today with extension of bandaging.  Pt with good complaince. PT Plan: continue with total decongestive techniques. Will need measurements next visit.      Problem List Patient Active Problem List   Diagnosis Date Noted  . Lymphedema of arm 12/13/2013  . Complete rupture of rotator cuff 11/22/2013  . Nipple discharge in female 12/01/2012  . History of breast cancer 11/30/2012  . History of right mastectomy, T1, N1, 11/13/2002.   Marland Kitchen FATIGUE 10/01/2009  . 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 DYSPNEA ON EXERTION 10/01/2009  . HYPERLIPIDEMIA 09/30/2009       Teena Irani, PTA/CLT 01/30/2014, 4:41 PM

## 2014-02-01 ENCOUNTER — Ambulatory Visit (HOSPITAL_COMMUNITY): Payer: Self-pay | Admitting: Physical Therapy

## 2014-02-04 ENCOUNTER — Ambulatory Visit (HOSPITAL_COMMUNITY)
Admission: RE | Admit: 2014-02-04 | Discharge: 2014-02-04 | Disposition: A | Discharge status: Home / Self Care | Payer: Worker's Compensation | Source: Ambulatory Visit | Attending: Surgery | Admitting: Surgery

## 2014-02-04 DIAGNOSIS — IMO0001 Reserved for inherently not codable concepts without codable children: Secondary | ICD-10-CM | POA: Diagnosis not present

## 2014-02-04 NOTE — Progress Notes (Signed)
Physical Therapy Treatment Patient Details  Name: Kylie Tucker MRN: [id number] Date of Birth: December 18, 1950  Today's Date: 02/04/2014 Time: 1100-1145 PT Time Calculation (min): 45 min Charge:  Manual 5790-3833  Visit#: 15 of 24  Re-eval: 02/26/14    Authorization: work comp  Subjective: Symptoms/Limitations Symptoms: Pt went to the beach over the weekend.  Was fitted for a new garment as well as a Reidsleeve today.  Pain Assessment Currently in Pain?: No/denies   Exercise/Treatments      Manual Therapy Other Manual Therapy: Pt received manual decongestive techniques including short neck, deep and superficial abdominal followed by routing fluid interaxillary (Rt to Lt) and Rt axillo-inguinal pathways. Rt UE was then wrapped with 1/2" foam and multilayer short stretch bandages from base of hand to shoulder  Physical Therapy Assessment and Plan PT Assessment and Plan Clinical Impression Statement: Pt has noted decrerased edema.  Increased cotton used for padding.  PT Plan: continue with total decongestive techniques. Remeasure next visit.         Problem List Patient Active Problem List   Diagnosis Date Noted  . Lymphedema of arm 12/13/2013  . Complete rupture of rotator cuff 11/22/2013  . Nipple discharge in female 12/01/2012  . History of breast cancer 11/30/2012  . History of right mastectomy, T1, N1, 11/13/2002.   Marland Kitchen FATIGUE 10/01/2009  . 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 DYSPNEA ON EXERTION 10/01/2009  . HYPERLIPIDEMIA 09/30/2009       GP    RUSSELL,CINDY 02/04/2014, 4:14 PM

## 2014-02-06 ENCOUNTER — Ambulatory Visit (HOSPITAL_COMMUNITY)
Admission: RE | Admit: 2014-02-06 | Discharge: 2014-02-06 | Disposition: A | Discharge status: Home / Self Care | Payer: Worker's Compensation | Source: Ambulatory Visit | Attending: Surgery | Admitting: Surgery

## 2014-02-06 DIAGNOSIS — IMO0001 Reserved for inherently not codable concepts without codable children: Secondary | ICD-10-CM | POA: Diagnosis not present

## 2014-02-06 NOTE — Progress Notes (Signed)
Physical Therapy Treatment Patient Details  Name: LAVEYAH ORIOL MRN: [id number] Date of Birth: [birth year]  Today's Date: 02/06/2014 Time: 8325-4982 PT Time Calculation (min): 50 min Visit#: 16 of 24  Re-eval: 02/26/14 Charges:  Manual 45    Subjective: Symptoms/Limitations Symptoms: Pt states her arm is doing much better.  States she is excited to try out her new sleeve. Pain Assessment Currently in Pain?: No/denies   Objective: Manual Therapy Other Manual Therapy: Pt received manual decongestive techniques including short neck, deep and superficial abdominal followed by routing fluid interaxillary (Rt to Lt) and Rt axillo-inguinal pathways. Rt UE was then wrapped with 1/2" foam and multilayer short stretch bandages from base of hand to shoulder  Physical Therapy Assessment and Plan PT Assessment and Plan Clinical Impression Statement: Pt with overall comfort reported with bandages.  Improved decompression since wrapping hand. PT Plan: continue with total decongestive techniques. Remeasure next visit.      Problem List Patient Active Problem List   Diagnosis Date Noted  . Lymphedema of arm 12/13/2013  . Complete rupture of rotator cuff 11/22/2013  . Nipple discharge in female 12/01/2012  . History of breast cancer 11/30/2012  . History of right mastectomy, T1, N1, 11/13/2002.   Marland Kitchen FATIGUE 10/01/2009  . 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 DYSPNEA ON EXERTION 10/01/2009  . HYPERLIPIDEMIA 09/30/2009       GP    Teena Irani, PTA/CLT 02/06/2014, 1:43 PM

## 2014-02-08 ENCOUNTER — Ambulatory Visit (HOSPITAL_COMMUNITY)
Admission: RE | Admit: 2014-02-08 | Discharge: 2014-02-08 | Disposition: A | Discharge status: Home / Self Care | Payer: Worker's Compensation | Source: Ambulatory Visit | Attending: Surgery | Admitting: Surgery

## 2014-02-08 DIAGNOSIS — IMO0001 Reserved for inherently not codable concepts without codable children: Secondary | ICD-10-CM | POA: Diagnosis not present

## 2014-02-08 NOTE — Progress Notes (Signed)
Physical Therapy Treatment Patient Details  Name: Kylie Tucker MRN: [id number] Date of Birth: 03-04-1951  Today's Date: 02/08/2014 Time: 1105-1210 PT Time Calculation (min): 65 min Charge:  Manual 1110-1210 Visit#: 17 of 24  Re-eval: 02/26/14    Authorization: work comp  Subjective: Symptoms/Limitations Symptoms: Pt states that she is glad the humidity is decreasing as it swells her arm      Exercise/Treatments        12/13/2013 12/13/2013 12/31/2013 01/21/2014 01/25/2014 02/08/2014    left right right right left. right   MCP 16.20 19.30 19.2 18.8 19 18.6   wrist 16.1 16.1 16.20 16.00 16.40 16.00   4cm 18.20 17.20 18.70 19.00 17.10 19.00   8cm 21.60 19.30 22.00 21.80 19.30 20.90   12 cm 26.30 22.10 26.30 24.90 22.30 24.70   16cm 28.70 23.70 28.40 28.50 23.90 27.00   20cm 29.70 24.40 30.20 28.30 24.50 28.30   24cm 29.20 25.70 30.70 28.50 25.30 29.90   28cm 31.50 27.70 33.30 31.50 27.50 32.60   32cm 35.20 31.10 36.00 35.00 31.50 37.00   36cm 37.70 33.80 38.00 36.50 34.10 37.60   40cm         44cm         48cm         52cm         56cm         58cm         60cm                  Sum of squares 8222.14 6482.92 8666.44 8040.58 6515.01 #REF! % volume lost  Total Volume 2617.189 4944.9675 2758.6145 9163.846 6599.3570 #REF! 100     Manual Therapy Other Manual Therapy:  Pt received manual decongestive techniques including short neck, deep and superficial abdominal followed by routing fluid interaxillary (Rt to Lt) and Rt axillo-inguinal pathways. Pt had manual completed both in supine and prone to access pathways.  Rt UE was then wrapped with 1/2" foam and multilayer short stretch bandages from base of hand to shoulder  Physical Therapy Assessment and Plan PT Assessment and Plan Clinical Impression Statement: Pt hand and forearm has decongested but UE has increased.  Consider four times a week to get ahead of congestion  PT Plan: Emphasize UE with manual decongestive techniqes.          Problem List Patient Active Problem List   Diagnosis Date Noted  . Lymphedema of arm 12/13/2013  . Complete rupture of rotator cuff 11/22/2013  . Nipple discharge in female 12/01/2012  . History of breast cancer 11/30/2012  . History of right mastectomy, T1, N1, 11/13/2002.   Marland Kitchen FATIGUE 10/01/2009  . 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 DYSPNEA ON EXERTION 10/01/2009  . HYPERLIPIDEMIA 09/30/2009       GP    RUSSELL,CINDY 02/08/2014, 3:14 PM

## 2014-02-11 ENCOUNTER — Ambulatory Visit (HOSPITAL_COMMUNITY)
Admission: RE | Admit: 2014-02-11 | Discharge: 2014-02-11 | Disposition: A | Discharge status: Home / Self Care | Payer: Worker's Compensation | Source: Ambulatory Visit | Attending: Surgery | Admitting: Surgery

## 2014-02-11 DIAGNOSIS — I972 Postmastectomy lymphedema syndrome: Secondary | ICD-10-CM | POA: Insufficient documentation

## 2014-02-11 DIAGNOSIS — IMO0001 Reserved for inherently not codable concepts without codable children: Secondary | ICD-10-CM | POA: Insufficient documentation

## 2014-02-11 NOTE — Progress Notes (Addendum)
Physical Therapy Treatment Patient Details  Name: Kylie Tucker MRN: [id number] Date of Birth: 1950/10/29  Today's Date: 02/11/2014 Time: 1015-1108 PT Time Calculation (min): 53 min Charge:  Manual 1015-1105 Visit#: 18 of 25  Re-eval: 02/26/14    Authorization: work comp     Subjective: Symptoms/Limitations Symptoms: Pt has no complaints  Pain Assessment Currently in Pain?: No/denies    Exercise/Treatments   Manual Therapy Other Manual Therapy: Pt received manual decongestive techniques including short neck, deep and superficial abdominal followed by routing fluid interaxillary (Rt to Lt) and Rt axillo-inguinal pathways. Pt had manual completed both in supine and prone to access pathways.  Rt UE was then wrapped with 1/2" foam and multilayer short stretch bandages from base of hand to shoulder  Physical Therapy Assessment and Plan PT Assessment and Plan Clinical Impression Statement: Pt had increased time spent routing fluid using intraaxillary and RT axillary-inguinal  anastomosis both anteriorly and posteriorly to decrease proximal congestion. PT Plan: continue to focus on proximal congestion        Problem List Patient Active Problem List   Diagnosis Date Noted  . Lymphedema of arm 12/13/2013  . Complete rupture of rotator cuff 11/22/2013  . Nipple discharge in female 12/01/2012  . History of breast cancer 11/30/2012  . History of right mastectomy, T1, N1, 11/13/2002.   Marland Kitchen FATIGUE 10/01/2009  . 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 DYSPNEA ON EXERTION 10/01/2009  . HYPERLIPIDEMIA 09/30/2009       GP    RUSSELL,CINDY 02/11/2014, 4:29 PM

## 2014-02-13 ENCOUNTER — Ambulatory Visit (HOSPITAL_COMMUNITY)
Admission: RE | Admit: 2014-02-13 | Discharge: 2014-02-13 | Disposition: A | Discharge status: Home / Self Care | Payer: Worker's Compensation | Source: Ambulatory Visit | Attending: Surgery | Admitting: Surgery

## 2014-02-13 DIAGNOSIS — IMO0001 Reserved for inherently not codable concepts without codable children: Secondary | ICD-10-CM | POA: Diagnosis not present

## 2014-02-13 NOTE — Progress Notes (Signed)
Physical Therapy Treatment Patient Details  Name: Kylie Tucker MRN: [id number] Date of Birth: Dec 04, 1950  Today's Date: 02/13/2014 Time: 48 (Front desk having difficulty signing pt in)-1350 PT Time Calculation (min): 40 min Charge:  Manual 1310-1350 Visit#: 19 of 25  Re-eval: 02/26/14    Authorization: work comp  Subjective: Symptoms/Limitations Symptoms: Pt states bandages felt better last visit states she is looking forward to recieving her new Reidsleeve  Pain Assessment Currently in Pain?: No/denies   Exercise/Treatments   Manual Therapy Other Manual Therapy: Pt received manual decongestive techniques including short neck, deep and superficial abdominal followed by routing fluid interaxillary (Rt to Lt) and Rt axillo-inguinal pathways. Pt had manual completed both in supine and prone to access pathways.  Rt UE was then wrapped with 1/2" foam and multilayer short stretch bandages from base of hand to shoulder  Physical Therapy Assessment and Plan PT Assessment and Plan Clinical Impression Statement: decreased congestion noted in trunk area.  Pt  appears to have decongested with last wrapping and with focusing on upper arm.  PT Plan: continue to focus on proximal congestion remeasure next treatment.         Problem List Patient Active Problem List   Diagnosis Date Noted  . Lymphedema of arm 12/13/2013  . Complete rupture of rotator cuff 11/22/2013  . Nipple discharge in female 12/01/2012  . History of breast cancer 11/30/2012  . History of right mastectomy, T1, N1, 11/13/2002.   Marland Kitchen FATIGUE 10/01/2009  . 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 DYSPNEA ON EXERTION 10/01/2009  . HYPERLIPIDEMIA 09/30/2009       GP    Dyonna Jaspers,CINDY PT/CLT 02/13/2014, 4:07 PM

## 2014-02-20 ENCOUNTER — Ambulatory Visit (HOSPITAL_COMMUNITY)
Admission: RE | Admit: 2014-02-20 | Discharge: 2014-02-20 | Disposition: A | Discharge status: Home / Self Care | Payer: Worker's Compensation | Source: Ambulatory Visit | Attending: Surgery | Admitting: Surgery

## 2014-02-20 DIAGNOSIS — IMO0001 Reserved for inherently not codable concepts without codable children: Secondary | ICD-10-CM | POA: Diagnosis not present

## 2014-02-20 NOTE — Progress Notes (Signed)
Physical Therapy Treatment Patient Details  Name: Kylie Tucker MRN: [id number] Date of Birth: 06/13/1951  Today's Date: 02/20/2014 Time: 0459-9774 PT Time Calculation (min): 48 min Charge:  Manual 1423-9532 Visit#: 20 of 25  Re-eval: 02/26/14    Authorization: work comp  Subjective: Symptoms/Limitations Symptoms: Pt states that she was unable to sleep last night due to the therapy at the orthopedic office being done on her Lt arm.  Pt has not had bandages on since Sunday.  Pain Assessment Currently in Pain?: Yes Pain Score: 5  Pain Location: Arm Pain Orientation: Left (we are working with RT UE; Lt shoulder has had recent rotator cuff surgery )   Exercise/Treatments      Manual Therapy Other Manual Therapy:  Pt received manual decongestive techniques including short neck, deep and superficial abdominal followed by routing fluid interaxillary (Rt to Lt) and Rt axillo-inguinal pathways. Pt had manual completed both in supine and prone to access pathways.  Rt UE was then wrapped with 1/2" foam and multilayer short stretch bandages from base of hand to shoulder  Physical Therapy Assessment and Plan PT Assessment and Plan Clinical Impression Statement: Pt has not had treatement in a week due to therapy office changing location.  We will wait until next visit to remeasure patient. PT Plan: remeasure for volumes.     Problem List Patient Active Problem List   Diagnosis Date Noted  . Lymphedema of arm 12/13/2013  . Complete rupture of rotator cuff 11/22/2013  . Nipple discharge in female 12/01/2012  . History of breast cancer 11/30/2012  . History of right mastectomy, T1, N1, 11/13/2002.   Marland Kitchen FATIGUE 10/01/2009  . 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 DYSPNEA ON EXERTION 10/01/2009  . HYPERLIPIDEMIA 09/30/2009       GP    Lissete Maestas,CINDY 02/20/2014, 4:28 PM

## 2014-02-22 ENCOUNTER — Ambulatory Visit (HOSPITAL_COMMUNITY): Payer: Self-pay | Admitting: Physical Therapy

## 2014-02-25 ENCOUNTER — Ambulatory Visit (HOSPITAL_COMMUNITY): Payer: Self-pay | Admitting: Physical Therapy

## 2014-02-27 ENCOUNTER — Ambulatory Visit (HOSPITAL_COMMUNITY)
Admission: RE | Admit: 2014-02-27 | Discharge: 2014-02-27 | Disposition: A | Discharge status: Home / Self Care | Payer: Worker's Compensation | Source: Ambulatory Visit | Attending: Surgery | Admitting: Surgery

## 2014-02-27 DIAGNOSIS — IMO0001 Reserved for inherently not codable concepts without codable children: Secondary | ICD-10-CM | POA: Diagnosis not present

## 2014-02-27 NOTE — Progress Notes (Signed)
Physical Therapy Treatment Patient Details  Name: Kylie Tucker MRN: [id number] Date of Birth: 30-May-1951  Today's Date: 02/27/2014 Time: 1530-1620 PT Time Calculation (min): 50 min Charge:  Manual 1530-1620 Visit#: 21 of 25   Authorization: work comp    Subjective: Symptoms/Limitations Symptoms: Pt has just returned from [employer] states now that school will begin she will be going to her beach house as often.  Pain Assessment Currently in Pain?: No/denies    Exercise/Treatments    Manual Therapy Other Manual Therapy: Pt received manual decongestive techniques including short neck, deep and superficial abdominal followed by routing fluid interaxillary (Rt to Lt) and Rt axillo-inguinal pathways. Pt had manual completed both in supine and prone to access pathways.  Rt UE was then wrapped with 1/2" foam and multilayer short stretch bandages from base of hand to shoulder  Physical Therapy Assessment and Plan PT Assessment and Plan Clinical Impression Statement: Pt was not remeasured seconday to not recieving any manual for a week will remeasure at the end of this week.  PT Plan: remeasue Friday.      Goals   progressing Problem List Patient Active Problem List   Diagnosis Date Noted  . Lymphedema of arm 12/13/2013  . Complete rupture of rotator cuff 11/22/2013  . Nipple discharge in female 12/01/2012  . History of breast cancer 11/30/2012  . History of right mastectomy, T1, N1, 11/13/2002.   Marland Kitchen FATIGUE 10/01/2009  . 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 DYSPNEA ON EXERTION 10/01/2009  . HYPERLIPIDEMIA 09/30/2009       GP    RUSSELL,CINDY 02/27/2014, 5:23 PM

## 2014-02-28 ENCOUNTER — Other Ambulatory Visit: Payer: Self-pay | Admitting: Dermatology

## 2014-03-01 ENCOUNTER — Ambulatory Visit (HOSPITAL_COMMUNITY)
Admission: RE | Admit: 2014-03-01 | Discharge: 2014-03-01 | Disposition: A | Discharge status: Home / Self Care | Payer: Worker's Compensation | Source: Ambulatory Visit | Attending: Surgery | Admitting: Surgery

## 2014-03-01 DIAGNOSIS — IMO0001 Reserved for inherently not codable concepts without codable children: Secondary | ICD-10-CM | POA: Diagnosis not present

## 2014-03-01 NOTE — Progress Notes (Signed)
Physical Therapy Treatment Patient Details  Name: KATIYA FIKE MRN: [id number] Date of Birth: [birth year]  Today's Date: 03/01/2014 Time: 1515-1600 PT Time Calculation (min): 45 min  Visit#: 2 of 25    Authorization: work comp   Subjective: Symptoms/Limitations Symptoms: Pt has Headache.  Bandages were fine last time. Pain Assessment Currently in Pain?: Yes Pain Score: 7  Pain Location: Head     Exercise/Treatments    Manual Therapy Other Manual Therapy:  Pt received manual decongestive techniques including short neck, deep and superficial abdominal followed by routing fluid interaxillary (Rt to Lt) and Rt axillo-inguinal pathways. Pt had manual completed both in supine and prone to access pathways.  Rt UE was then wrapped with 1/2" foam and multilayer short stretch bandages from base of hand to shoulder  Physical Therapy Assessment and Plan PT Assessment and Plan Clinical Impression Statement: Pt has noted congestion posterior aspect of thoracic area; increased manual time was spent in this area PT Plan: remeasue Monday     Problem List Patient Active Problem List   Diagnosis Date Noted  . Lymphedema of arm 12/13/2013  . Complete rupture of rotator cuff 11/22/2013  . Nipple discharge in female 12/01/2012  . History of breast cancer 11/30/2012  . History of right mastectomy, T1, N1, 11/13/2002.   Marland Kitchen FATIGUE 10/01/2009  . 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 DYSPNEA ON EXERTION 10/01/2009  . HYPERLIPIDEMIA 09/30/2009       GP    Maryagnes Carrasco,CINDY 03/01/2014, 4:52 PM

## 2014-03-04 ENCOUNTER — Ambulatory Visit (HOSPITAL_COMMUNITY): Payer: Self-pay | Admitting: Physical Therapy

## 2014-03-04 ENCOUNTER — Other Ambulatory Visit: Payer: Self-pay

## 2014-03-04 DIAGNOSIS — Z1231 Encounter for screening mammogram for malignant neoplasm of breast: Secondary | ICD-10-CM

## 2014-03-06 ENCOUNTER — Ambulatory Visit (HOSPITAL_COMMUNITY)
Admission: RE | Admit: 2014-03-06 | Discharge: 2014-03-06 | Disposition: A | Discharge status: Home / Self Care | Payer: Worker's Compensation | Source: Ambulatory Visit | Attending: Surgery | Admitting: Surgery

## 2014-03-06 DIAGNOSIS — IMO0001 Reserved for inherently not codable concepts without codable children: Secondary | ICD-10-CM | POA: Diagnosis not present

## 2014-03-06 NOTE — Progress Notes (Addendum)
Physical Therapy Treatment Patient Details  Name: Kylie Tucker MRN: [id number] Date of Birth: September 21, 1950  Today's Date: 03/06/2014 Time: 0174 (pt stuck in traffic)-1615 PT Time Calculation (min): 45 min Charge: manual 1530-1615. Visit#: 23 of 25  Re-eval: 02/26/14    Authorization: work comp  Subjective: Symptoms/Limitations Symptoms: Pt has Headache.  Bandages were fine last time. Pain Assessment Pain Score: 7  Pain Orientation: Left (we are working on Rt but Lt UE has had recent  Rotator cuff repain)    Exercise/Treatments 02/08/2014 03/06/2014  right right  18.6 18.3  16.00 16.00  19.00 18.30  20.90 21.70  24.70 24.50  27.00 27.20  28.30 29.20  29.90 29.20  32.60 32.20  37.00 34.50  37.60 37.00                          8279.28 8038.13  2635.3776 9449.6759         Manual Therapy Other Manual Therapy:  Pt received manual decongestive techniques including short neck, deep and superficial abdominal followed by routing fluid interaxillary (Rt to Lt) and Rt axillo-inguinal pathways. Pt had manual completed both in supine and prone to access pathways.  Rt UE was then wrapped with 1/2" foam and multilayer short stretch bandages from base of hand to shoulder  Physical Therapy Assessment and Plan PT Assessment and Plan Clinical Impression Statement: Pt continues to have congestion inferior to axilla both anterior and posteriorly but is less than last treatment.  Total fluid loss since 02/03/2014 is 76.76 cc but pt has only been her for 6 visits secondary to vacations since 7/26. PT Plan: Continue with total decongestive techniques; see if pt can come more frequently as treatment will be more effective.      Goals    Problem List Patient Active Problem List   Diagnosis Date Noted  . Lymphedema of arm 12/13/2013  . Complete rupture of rotator cuff 11/22/2013  . Nipple discharge in female 12/01/2012  . History of breast cancer 11/30/2012  . History of right  mastectomy, T1, N1, 11/13/2002.   Marland Kitchen FATIGUE 10/01/2009  . 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 DYSPNEA ON EXERTION 10/01/2009  . HYPERLIPIDEMIA 09/30/2009       GP    Asiyah Pineau,CINDY 03/06/2014, 4:33 PM

## 2014-03-08 ENCOUNTER — Ambulatory Visit (HOSPITAL_COMMUNITY): Payer: Worker's Compensation | Admitting: Physical Therapy

## 2014-03-11 ENCOUNTER — Ambulatory Visit (HOSPITAL_COMMUNITY)
Admission: RE | Admit: 2014-03-11 | Discharge: 2014-03-11 | Disposition: A | Discharge status: Home / Self Care | Payer: Worker's Compensation | Source: Ambulatory Visit | Attending: Surgery | Admitting: Surgery

## 2014-03-11 DIAGNOSIS — IMO0001 Reserved for inherently not codable concepts without codable children: Secondary | ICD-10-CM | POA: Diagnosis not present

## 2014-03-11 NOTE — Progress Notes (Signed)
Physical Therapy Treatment Patient Details  Name: Kylie Tucker MRN: [id number] Date of Birth: 1951/03/11  Today's Date: 03/11/2014 Time: 1515-1600 PT Time Calculation (min): 45 min  Visit#: 24 of 25  Re-eval: 02/27/14  Authorization: work comp  Charges:  Manual 45  Subjective: Symptoms/Limitations Symptoms: Pt comes today with bandaging intact.  Garment fitter to meet here today for compression garment.  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 Pt had manual completed both in supine and prone to access pathways. Rt UE was then wrapped with 1/2" foam and multilayer short stretch bandages from base of hand to shoulder  Physical Therapy Assessment and Plan PT Assessment and Plan Clinical Impression Statement: Pt unable to wear compression garment brought by fitters today due to irritating skin.  Pt is allergic to silicone, wool and nylon and fitter was not aware of this.  To try cotton Juzo sleeve to see if patient can wear without irritation.   manual lymph drainage and bandaging was completed today.   PT Plan: Continue with total decongestive techniques.  Re-evaluate next visit.     Problem List Patient Active Problem List   Diagnosis Date Noted  . Lymphedema of arm 12/13/2013  . Complete rupture of rotator cuff 11/22/2013  . Nipple discharge in female 12/01/2012  . History of breast cancer 11/30/2012  . History of right mastectomy, T1, N1, 11/13/2002.   Marland Kitchen FATIGUE 10/01/2009  . 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 DYSPNEA ON EXERTION 10/01/2009  . HYPERLIPIDEMIA 09/30/2009       Teena Irani,  PTA/CLT 03/11/2014, 6:25 PM

## 2014-03-12 ENCOUNTER — Other Ambulatory Visit: Payer: Self-pay | Admitting: Gastroenterology

## 2014-03-13 ENCOUNTER — Ambulatory Visit (HOSPITAL_COMMUNITY)
Admission: RE | Admit: 2014-03-13 | Discharge: 2014-03-13 | Disposition: A | Discharge status: Home / Self Care | Payer: Worker's Compensation | Source: Ambulatory Visit | Attending: Surgery | Admitting: Surgery

## 2014-03-13 DIAGNOSIS — IMO0001 Reserved for inherently not codable concepts without codable children: Secondary | ICD-10-CM | POA: Insufficient documentation

## 2014-03-13 DIAGNOSIS — I972 Postmastectomy lymphedema syndrome: Secondary | ICD-10-CM | POA: Diagnosis not present

## 2014-03-13 NOTE — Progress Notes (Signed)
Physical Therapy Re-evaluation  Patient Details  Name: Kylie Tucker MRN: [id number] Date of Birth: Sep 21, 1950  Today's Date: 03/13/2014 Time: 1515-1600 PT Time Calculation (min): 45 min         Visit#: 25 of 37  Re-eval: 02/27/14 Charges:  Manual 40 Authorization: work comp (due to unable to to self massage/don pump or bandage with Lt UE due to rotator cuff repair)      Subjective Symptoms/Limitations Symptoms: Pt states her Rt arm is feeling better, however she is still unable to bandage or use her compression pump due to her Lt shoulder surgery.  Pain Assessment Currently in Pain?: No/denies  Objective: Manual Therapy Other Manual Therapy: Pt received manual decongestive techniques including short neck, deep and superficial abdominal followed by routing fluid interaxillary (Rt to Lt) and Rt axillo-inguinal pathways. Pt had manual completed both in supine and prone to access pathways. Rt UE was then wrapped with 1/2" foam and multilayer short stretch bandages from base of hand to shoulder   Date 02/08/2014 03/13/2014   right right  MCP 18.60 18.60  WRIST 16 16  4cm 19.00 20.00  8cm 20.90 22.20  12 cm 24.70 25.50  16cm 27.00 27.60  20cm 28.30 28.10  24cm 29.90 30.50  28cm 32.60 32.70  32cm 37.00 34.70  36cm 37.60 36.00      Sum of squares 8279.28 8196.05  Total Volume 2635.378 2608.885    Physical Therapy Assessment and Plan PT Assessment and Plan Clinical Impression Statement: Pt with continued reduction in Rt UE, though minimal since 02/08/14 of 26.5 cc.  Pt was unable to wear compression garment brought by fitters due to skin irritation.  Pt to receive new garment in next couple weeks (juzo cotton).  Pt will need continued massage and bandaging until receives garment and husband may assist with donning/doffing.   PT Frequency: Min 3X/week PT Duration: 4 weeks PT Plan: Continue with total decongestive techniques X 4 more weeks or until receives compression sleeve.     Problem List Patient Active Problem List   Diagnosis Date Noted  . Lymphedema of arm 12/13/2013  . Complete rupture of rotator cuff 11/22/2013  . Nipple discharge in female 12/01/2012  . History of breast cancer 11/30/2012  . History of right mastectomy, T1, N1, 11/13/2002.   Marland Kitchen FATIGUE 10/01/2009  . 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 DYSPNEA ON EXERTION 10/01/2009  . HYPERLIPIDEMIA 09/30/2009       Teena Irani, PTA/CLT 03/13/2014, 6:31 PM

## 2014-03-20 ENCOUNTER — Ambulatory Visit (HOSPITAL_COMMUNITY)
Admission: RE | Admit: 2014-03-20 | Discharge: 2014-03-20 | Disposition: A | Discharge status: Home / Self Care | Payer: Worker's Compensation | Source: Ambulatory Visit | Attending: Surgery | Admitting: Surgery

## 2014-03-20 DIAGNOSIS — IMO0001 Reserved for inherently not codable concepts without codable children: Secondary | ICD-10-CM | POA: Diagnosis not present

## 2014-03-20 NOTE — Progress Notes (Signed)
Physical Therapy Treatment Patient Details  Name: Kylie Tucker MRN: [id number] Date of Birth: [birth year]  Today's Date: 03/20/2014 Time: 1015-1100 PT Time Calculation (min): 45 min Visit#: 26 of 37  Re-eval: 02/27/14 Authorization: work comp (due to unable to to self massage/don pump or bandage with Lt UE due to rotator cuff repair)  Charges:  Manual 40  Subjective: Symptoms/Limitations Symptoms: Pt comes today with Reidsleeve to insure proper fit.  Pt also questioning about compression pump. Pain Assessment Currently in Pain?: No/denies   Objective: Manual Therapy Other Manual Therapy: Pt received manual decongestive techniques including short neck, deep and superficial abdominal followed by routing fluid interaxillary (Rt to Lt) and Rt axillo-inguinal pathways. Pt had manual completed both in supine and prone to access pathways. Rt UE was then wrapped with cotton and multilayer short stretch bandages from base of hand to shoulder  Physical Therapy Assessment and Plan PT Assessment and Plan Clinical Impression Statement: Pt placed in intermittent sequential pump at clinic and explained mechanism of action.  Pt is to meet with representative on Friday.  Reidsleeve donned with proper fit and comfort noted.  Pt request no foam be used with bandaging today.  Moisturized prior to bandaging using cotton padding and 5 layers of short stretch bandaging.  Pt reported comfort at completion of session with even compression palpated up UE.  Nicoletta Dress contacted to remind of patients appointment here at the clinic on Friday per patient request.  PT Duration: 4 weeks PT Plan: Continue with total decongestive techniques.  [name] to come from [facility] next visit (prior to appt) to consult on compression pump.      Problem List Patient Active Problem List   Diagnosis Date Noted  . Lymphedema of arm 12/13/2013  . Complete rupture of rotator cuff 11/22/2013  . Nipple discharge in  female 12/01/2012  . History of breast cancer 11/30/2012  . History of right mastectomy, T1, N1, 11/13/2002.   Marland Kitchen FATIGUE 10/01/2009  . 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 DYSPNEA ON EXERTION 10/01/2009  . HYPERLIPIDEMIA 09/30/2009     Teena Irani, PTA/CLT 03/20/2014, 11:38 AM

## 2014-03-22 ENCOUNTER — Ambulatory Visit (HOSPITAL_COMMUNITY)
Admission: RE | Admit: 2014-03-22 | Discharge: 2014-03-22 | Disposition: A | Discharge status: Home / Self Care | Payer: Worker's Compensation | Source: Ambulatory Visit | Attending: Surgery | Admitting: Surgery

## 2014-03-22 DIAGNOSIS — IMO0001 Reserved for inherently not codable concepts without codable children: Secondary | ICD-10-CM | POA: Diagnosis not present

## 2014-03-22 NOTE — Progress Notes (Signed)
Physical Therapy Treatment Patient Details  Name: Kylie Tucker MRN: [id number] Date of Birth: May 04, 1951  Today's Date: 03/22/2014 Time: 9562-1308 PT Time Calculation (min): 45 min Charge:  Manual [id number] Visit#: 27 of 37  Re-eval: 02/27/14     Subjective: Symptoms/Limitations Symptoms: PT complains of fluid in upper arm and in arm pit area      Exercise/Treatments     Manual Therapy Other Manual Therapy: Pt received manual decongestive techniques including short neck, deep and superficial abdominal followed by routing fluid interaxillary (Rt to Lt) and Rt axillo-inguinal pathways. Pt had manual completed both in supine and prone to access pathways. Rt UE was then wrapped with cotton and multilayer short stretch bandages from base of hand to shoulder  Physical Therapy Assessment and Plan PT Assessment and Plan Clinical Impression Statement: Suggested that pt wear compression in chest area to relieve congestion in underarm and inferior to underarm.  Pt refused foam therefore pt was wrapped with short stretch bandages only. [name] did not show for appointment . PT Plan: Continue with total decongestive techniques.      Goals    Problem List Patient Active Problem List   Diagnosis Date Noted  . Lymphedema of arm 12/13/2013  . Complete rupture of rotator cuff 11/22/2013  . Nipple discharge in female 12/01/2012  . History of breast cancer 11/30/2012  . History of right mastectomy, T1, N1, 11/13/2002.   Marland Kitchen FATIGUE 10/01/2009  . 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 DYSPNEA ON EXERTION 10/01/2009  . HYPERLIPIDEMIA 09/30/2009       GP    Kylie Tucker,Kylie Tucker 03/22/2014, 5:10 PM

## 2014-03-25 ENCOUNTER — Ambulatory Visit (HOSPITAL_COMMUNITY)
Admission: RE | Admit: 2014-03-25 | Discharge: 2014-03-25 | Disposition: A | Discharge status: Home / Self Care | Payer: Worker's Compensation | Source: Ambulatory Visit | Attending: Surgery | Admitting: Surgery

## 2014-03-25 DIAGNOSIS — IMO0001 Reserved for inherently not codable concepts without codable children: Secondary | ICD-10-CM | POA: Diagnosis not present

## 2014-03-25 NOTE — Progress Notes (Signed)
Physical Therapy Treatment Patient Details  Name: Kylie Tucker MRN: [id number] Date of Birth: 14-Sep-1950  Today's Date: 03/25/2014 Time: 4098-1191 PT Time Calculation (min): 40 min Visit#: 28 of 37  Re-eval: 04/05/14 Charges:  Manual 40   Subjective: Symptoms/Limitations Symptoms: Pt states [name] (rep from [employer]) did not contact her until today.  He is meeting her here on her next appointment on Wednesday. Pt states she has not had a chance to check on a compression top.  Still having some congestion sub axillary region. Pain Assessment Currently in Pain?: No/denies   Objective: Manual Therapy Other Manual Therapy: Pt received manual manual lymph drainage including short neck, deep and superficial abdominal followed by routing fluid interaxillary (Rt to Lt) and Rt axillo-inguinal pathways. Pt had manual completed both in supine and prone to access pathways. Rt UE was then wrapped with cotton and multilayer short stretch bandages from base of hand to shouldertaken   Physical Therapy Assessment and Plan PT Assessment and Plan Clinical Impression Statement: Manual lymph drainage completed f/b moisturizing Rt UE and bandaging using 5 layers of short stretch bandaging, cotton padding and soft stockinette.  Pt with adequate compression and comfort reported at end of session. PT Plan: Continue with total decongestive techniques.       Problem List Patient Active Problem List   Diagnosis Date Noted  . Lymphedema of arm 12/13/2013  . Complete rupture of rotator cuff 11/22/2013  . Nipple discharge in female 12/01/2012  . History of breast cancer 11/30/2012  . History of right mastectomy, T1, N1, 11/13/2002.   Marland Kitchen FATIGUE 10/01/2009  . 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 DYSPNEA ON EXERTION 10/01/2009  . HYPERLIPIDEMIA 09/30/2009      Teena Irani, PTA/CLT 03/25/2014, 4:06 PM

## 2014-03-26 ENCOUNTER — Ambulatory Visit
Admission: RE | Admit: 2014-03-26 | Discharge: 2014-03-26 | Disposition: A | Discharge status: Home / Self Care | Payer: 59 | Source: Ambulatory Visit

## 2014-03-26 DIAGNOSIS — Z1231 Encounter for screening mammogram for malignant neoplasm of breast: Secondary | ICD-10-CM

## 2014-03-27 ENCOUNTER — Ambulatory Visit (HOSPITAL_COMMUNITY)
Admission: RE | Admit: 2014-03-27 | Discharge: 2014-03-27 | Disposition: A | Discharge status: Home / Self Care | Payer: Worker's Compensation | Source: Ambulatory Visit | Attending: Surgery | Admitting: Surgery

## 2014-03-27 DIAGNOSIS — IMO0001 Reserved for inherently not codable concepts without codable children: Secondary | ICD-10-CM | POA: Diagnosis not present

## 2014-03-27 NOTE — Progress Notes (Addendum)
Physical Therapy Treatment Patient Details  Name: Kylie Tucker MRN: [id number] Date of Birth: 10/31/1950  Today's Date: 03/27/2014 Time: 1530-1610 PT Time Calculation (min): 40 min Charge:  Manual 1530-1610  Visit#: 29 of 37  Re-eval: 04/05/14   Subjective: Symptoms/Limitations Symptoms: Pt states Kylie Tucker medical rep is to meet her at the facility today.  Rep came and stayed with pt from 3:15-3:30.   Pain Assessment Currently in Pain?: No/denies   Exercise/Treatments Manual Therapy Other Manual Therapy: manual techniques used including supraclavicular, deep and superfical abdominal followed by routing fluid using intraaxillary anastomosis as well as Rt inguinal-axillary pathway.  Manual completed both anterior and posteriorly .  UE was then wrapped using short stretch bandaging no foam was used secondary to pt request.   Physical Therapy Assessment and Plan PT Assessment and Plan Clinical Impression Statement: PT appears to have less congestion sub axillary area.   PT Plan: remeasure pt next treatment.     Problem List Patient Active Problem List   Diagnosis Date Noted  . Lymphedema of arm 12/13/2013  . Complete rupture of rotator cuff 11/22/2013  . Nipple discharge in female 12/01/2012  . History of breast cancer 11/30/2012  . History of right mastectomy, T1, N1, 11/13/2002.   Marland Kitchen FATIGUE 10/01/2009  . 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 DYSPNEA ON EXERTION 10/01/2009  . HYPERLIPIDEMIA 09/30/2009       GP    Kylie Tucker,Kylie Tucker 03/27/2014, 4:47 PM

## 2014-03-29 ENCOUNTER — Ambulatory Visit (HOSPITAL_COMMUNITY)
Admission: RE | Admit: 2014-03-29 | Discharge: 2014-03-29 | Disposition: A | Discharge status: Home / Self Care | Payer: Worker's Compensation | Source: Ambulatory Visit | Attending: Surgery | Admitting: Surgery

## 2014-03-29 DIAGNOSIS — IMO0001 Reserved for inherently not codable concepts without codable children: Secondary | ICD-10-CM | POA: Diagnosis not present

## 2014-03-29 NOTE — Progress Notes (Signed)
Physical Therapy Treatment Patient Details  Name: Kylie Tucker MRN: [id number] Date of Birth: 30-Aug-1950  Today's Date: 03/29/2014 Time: 1530-1620 PT Time Calculation (min): 50 min Charge:  Manual 1530-1620  Visit#: 30 of 37  Re-eval: 04/05/14   Subjective: Symptoms/Limitations Symptoms: Pt states that she kept her bandages on until this morning.  Pain Assessment Currently in Pain?: No/denies     Exercise/Treatments   03/29/2014  RT  18.6  16.70  19.00  21.80  24.80  27.50  28.30  30.20  33.80  36.30  38.50                  8487.69  2701.7166     Manual Therapy Other Manual Therapy: manual techniques used including supraclavicular, deep and superfical abdominal followed by routing fluid using intraaxillary anastomosis as well as Rt inguinal-axillary pathway.  Manual completed both anterior and posteriorly .  UE was then wrapped using foam, and multilayer short stretch bandaging   Physical Therapy Assessment and Plan PT Assessment and Plan Clinical Impression Statement: Pt with increased volumes by 143 cc.  Therapist presumes this is secondary to pt no wanting to be foamed wrapped.  Therapist consulted with pt about needing extra compression; pt agreed to foam wrapping today  PT Plan: continue with decongestive techniques until pt recieves day garment then D/C         Problem List Patient Active Problem List   Diagnosis Date Noted  . Lymphedema of arm 12/13/2013  . Complete rupture of rotator cuff 11/22/2013  . Nipple discharge in female 12/01/2012  . History of breast cancer 11/30/2012  . History of right mastectomy, T1, N1, 11/13/2002.   Marland Kitchen FATIGUE 10/01/2009  . 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 DYSPNEA ON EXERTION 10/01/2009  . HYPERLIPIDEMIA 09/30/2009       GP    Akeema Broder,CINDY 03/29/2014, 4:29 PM

## 2014-04-01 ENCOUNTER — Ambulatory Visit (HOSPITAL_COMMUNITY)
Admission: RE | Admit: 2014-04-01 | Discharge: 2014-04-01 | Disposition: A | Discharge status: Home / Self Care | Payer: Worker's Compensation | Source: Ambulatory Visit | Attending: Surgery | Admitting: Surgery

## 2014-04-01 DIAGNOSIS — IMO0001 Reserved for inherently not codable concepts without codable children: Secondary | ICD-10-CM | POA: Diagnosis not present

## 2014-04-01 NOTE — Progress Notes (Signed)
Physical Therapy Treatment Patient Details  Name: Kylie Tucker MRN: [id number] Date of Birth: 1950/09/24  Today's Date: 04/01/2014 Time: 1515-1600 PT Time Calculation (min): 45 min Visit#: 31 of 37  Re-eval: 04/05/14 Charges:  Manual 40   Subjective: Symptoms/Limitations Symptoms: Pt states the bandages were comfortable over the weekend.  STates she used her reidsleeve last night.  Reports she is getting a pump with thoracic attachement. Pain Assessment Currently in Pain?: No/denies   Objective: Manual Therapy Other Manual Therapy: manual techniques used including supraclavicular, deep and superfical abdominal followed by routing fluid using intraaxillary anastomosis as well as Rt inguinal-axillary pathway. Manual completed both anterior and posteriorly . UE moisturized  then wrapped using 1/2" foam and short stretch bandaging   Physical Therapy Assessment and Plan PT Assessment and Plan Clinical Impression Statement: Pt with noted reduction in UE since resuming foam for decompression.  Pt continues to complete HEP. PT Plan: continue with decongestive techniques until pt recieves day garment then D/C      Problem List Patient Active Problem List   Diagnosis Date Noted  . Lymphedema of arm 12/13/2013  . Complete rupture of rotator cuff 11/22/2013  . Nipple discharge in female 12/01/2012  . History of breast cancer 11/30/2012  . History of right mastectomy, T1, N1, 11/13/2002.   Marland Kitchen FATIGUE 10/01/2009  . 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 DYSPNEA ON EXERTION 10/01/2009  . HYPERLIPIDEMIA 09/30/2009      Teena Irani, PTA/CLT 04/01/2014, 5:19 PM

## 2014-04-03 ENCOUNTER — Ambulatory Visit (HOSPITAL_COMMUNITY)
Admission: RE | Admit: 2014-04-03 | Discharge: 2014-04-03 | Disposition: A | Discharge status: Home / Self Care | Payer: Worker's Compensation | Source: Ambulatory Visit | Attending: Surgery | Admitting: Surgery

## 2014-04-03 DIAGNOSIS — IMO0001 Reserved for inherently not codable concepts without codable children: Secondary | ICD-10-CM | POA: Diagnosis not present

## 2014-04-03 NOTE — Progress Notes (Signed)
Physical Therapy Treatment Patient Details  Name: Kylie Tucker MRN: [id number] Date of Birth: 1950/09/02  Today's Date: 04/03/2014 Time: 1515-1600 PT Time Calculation (min): 45 min Visit#: 32 of 37  Re-eval: 04/05/14 Charges:  Manual 45  Subjective: Symptoms/Limitations Symptoms: Pt comes today with Rt UE self bandaged without foam.  No complaints of pain or discomfort. Pain Assessment Currently in Pain?: No/denies   Objective: Manual Therapy Other Manual Therapy: manual techniques used including supraclavicular, deep and superfical abdominal followed by routing fluid using intraaxillary anastomosis as well as Rt inguinal-axillary pathway. Manual completed both anterior and posteriorly . UE moisturized then wrapped using 1/2" foam and short stretch bandaging   Physical Therapy Assessment and Plan PT Assessment and Plan Clinical Impression Statement: Continued reduction noted in Rt UE.  Pt now able to complete self bandaging.   PT Plan: continue with decongestive techniques until pt recieves day garment then D/C.   Measure next visit.     Problem List Patient Active Problem List   Diagnosis Date Noted  . Lymphedema of arm 12/13/2013  . Complete rupture of rotator cuff 11/22/2013  . Nipple discharge in female 12/01/2012  . History of breast cancer 11/30/2012  . History of right mastectomy, T1, N1, 11/13/2002.   Marland Kitchen FATIGUE 10/01/2009  . 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 DYSPNEA ON EXERTION 10/01/2009  . HYPERLIPIDEMIA 09/30/2009       GP    Teena Irani, PTA/CLT 04/03/2014, 5:15 PM

## 2014-04-05 ENCOUNTER — Ambulatory Visit (HOSPITAL_COMMUNITY)
Admission: RE | Admit: 2014-04-05 | Discharge: 2014-04-05 | Disposition: A | Discharge status: Home / Self Care | Payer: Worker's Compensation | Source: Ambulatory Visit | Attending: Surgery | Admitting: Surgery

## 2014-04-05 DIAGNOSIS — IMO0001 Reserved for inherently not codable concepts without codable children: Secondary | ICD-10-CM | POA: Diagnosis not present

## 2014-04-05 NOTE — Evaluation (Signed)
Physical Therapy Evaluation  Patient Details  Name: Kylie Tucker MRN: [id number] Date of Birth: 02/07/1951  Today's Date: 04/05/2014 Time: 1520-1610 PT Time Calculation (min): 50 min Charge:  Manual 1520-1610              Visit#: 33 of 39  Re-eval: 04/19/14   Past Medical History:  Past Medical History  Diagnosis Date  . Cellulitis and abscess of upper arm and forearm   . Postmastectomy lymphedema rt side  . History of right mastectomy 2004    chemo/ radiation  . Migraines   . Cancer rt breast    2004/ chemo/ radiation  . Allergy   . Hyperlipidemia   . PONV (postoperative nausea and vomiting)     cannot take zofran  . GERD (gastroesophageal reflux disease)   . Sentinel node   . Anxiety    Past Surgical History:  Past Surgical History  Procedure Laterality Date  . Rt mastectomy  2004    lymph nodes-7-axillary node dissection  . Nasal sinus surgery  1990/2001  . Mandible surgery  1991  . Port-a-cath removal      in and now out  . Nasal sinus surgery    . Mouth surgery    . Orif finger fracture  02/14/2012    Procedure: OPEN REDUCTION INTERNAL FIXATION (ORIF) METACARPAL (FINGER) FRACTURE;  Surgeon: Wynonia Sours, MD;  Location: [place];  Service: Orthopedics;  Laterality: Right;  RIGHT FIFTH   . Breast lumpectomy  2004     br bx  . Orif metacarpal fracture  8/13    rt   . Repair extensor tendon Left 05/30/2013    Procedure: RELEASE TRANSPOSITION EXTENSOR POLLICUS LONGUS LEFT WRIST;  Surgeon: Wynonia Sours, MD;  Location: [place];  Service: Orthopedics;  Laterality: Left;  . Breast lumpectomy with axillary lymph node dissection    . Carpal tunnel release    . Wrist arthroscopy Left 2014  . Shoulder arthroscopy with rotator cuff repair and subacromial decompression Left 11/22/2013    Procedure: LEFT SHOULDER ARTHROSCOPY WITH SUBACROMIAL DECOMPRESSION DISTAL CLAVICLE RESECTION REPAIR  ROTATOR CUFF AS NEEDED ;  Surgeon: Cammie Sickle., MD;  Location: [place];  Service: Orthopedics;  Laterality: Left;    Subjective Symptoms/Limitations Symptoms: Pt states she has not heard from rep. about her garment  Pain Assessment Currently in Pain?: No/denies  Assessment     Volume initially on Rt 2617 cc  03/29/2014 04/05/2014  RT Rt   18.6 18.1  16.70 16.30  19.00 17.70  21.80 20.70  24.80 24.30  27.50 26.70  28.30 28.00  30.20 28.80  33.80 33.00  36.30 34.40  38.50 38.50                          8487.69 8006.51  2701.7166 9485.4627       Exercise/Treatments  Manual Therapy Other Manual Therapy: manual techniques used including supraclavicular, deep and superfical abdominal followed by routing fluid using intraaxillary anastomosis as well as Rt inguinal-axillary pathway. Manual completed both anterior and posteriorly . UE moisturized then wrapped using 1/2" foam and short stretch bandaging   Physical Therapy Assessment and Plan PT Assessment and Plan Clinical Impression Statement: Pt has had noted decreased congestion since now using foam with bandaging, (decreased 150cc since last week).  Overall edma remains only slightly less than when initially seen, however, due to pt having rotator cuff surgery on  her LT LE she is unable to wrap herself.  Pt has obtained a night garment and is waiting for her day garment once she has aquiered this we will discontinue services .   PT Frequency: Min 3X/week PT Duration:  (2 weeks (pt will have been seen for a total of 39 visits)) PT Plan: continue with decongestive techniques until pt recieves day garment then D/C.      Goals PT Short Term Goals Time to Complete Short Term Goals: 2 weeks PT Short Term Goal 1: Decrease volume by 30% PT Short Term Goal 1 - Progress: Progressing toward goal PT Long Term Goals Time to Complete Long Term Goals: 4 weeks PT Long Term Goal 1: Pt volume to be decreased by 60% PT Long Term Goal 1 - Progress: Not  met  Problem List Patient Active Problem List   Diagnosis Date Noted  . Lymphedema of arm 12/13/2013  . Complete rupture of rotator cuff 11/22/2013  . Nipple discharge in female 12/01/2012  . History of breast cancer 11/30/2012  . History of right mastectomy, T1, N1, 11/13/2002.   Marland Kitchen FATIGUE 10/01/2009  . 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 DYSPNEA ON EXERTION 10/01/2009  . HYPERLIPIDEMIA 09/30/2009       GP    RUSSELL,CINDY 04/05/2014, 5:16 PM  Physician Documentation Your signature is required to indicate approval of the treatment plan as stated above.  Please sign and either send electronically or make a copy of this report for your files and return this physician signed original.   Please mark one 1.__approve of plan  2. ___approve of plan with the following conditions.   ______________________________                                                          _____________________ Physician Signature                                                                                                             Date

## 2014-04-08 ENCOUNTER — Ambulatory Visit (HOSPITAL_COMMUNITY)
Admission: RE | Admit: 2014-04-08 | Discharge: 2014-04-08 | Disposition: A | Discharge status: Home / Self Care | Payer: Worker's Compensation | Source: Ambulatory Visit | Attending: Surgery | Admitting: Surgery

## 2014-04-08 DIAGNOSIS — IMO0001 Reserved for inherently not codable concepts without codable children: Secondary | ICD-10-CM | POA: Diagnosis not present

## 2014-04-08 NOTE — Progress Notes (Signed)
Physical Therapy Treatment Patient Details  Name: Kylie Tucker MRN: [id number] Date of Birth: Dec 28, 1950  Today's Date: 04/08/2014 Time: 3716-9678 PT Time Calculation (min): 41 min Visit#: 34 of 37  Re-eval: 04/19/14 Charges:  Manual 40   Subjective: Symptoms/Limitations Symptoms: Pt states she bandaged her arm over the weekend but she didnt use the foam.  States she is eager to get her day/night garments and doesnt understand what is taking so long. Pain Assessment Currently in Pain?: No/denies   Objective: Manual Therapy Other Manual Therapy: manual techniques used including supraclavicular, deep and superfical abdominal followed by routing fluid using intraaxillary anastomosis as well as Rt inguinal-axillary pathway. Manual completed both anterior and posteriorly . UE moisturized then wrapped using 1/2" foam and short stretch bandaging   Physical Therapy Assessment and Plan PT Assessment and Plan Clinical Impression Statement: Notable decreased congestion as compared to last week.    Left message with fitter to inquire on ETA of garment as patient is ready to go into sleeve and discontinue services.  Encoraged patient to continue bandaging UE until sleeve is received.   PT Frequency: Min 3X/week PT Duration:  (2 weeks (pt will have been seen for a total of 39 visits)) PT Plan: continue with decongestive techniques until pt recieves day garment then D/C.       Problem List Patient Active Problem List   Diagnosis Date Noted  . Lymphedema of arm 12/13/2013  . Complete rupture of rotator cuff 11/22/2013  . Nipple discharge in female 12/01/2012  . History of breast cancer 11/30/2012  . History of right mastectomy, T1, N1, 11/13/2002.   Marland Kitchen FATIGUE 10/01/2009  . 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 DYSPNEA ON EXERTION 10/01/2009  . HYPERLIPIDEMIA 09/30/2009      Teena Irani, PTA/CLT 04/08/2014, 5:16 PM

## 2014-04-10 ENCOUNTER — Ambulatory Visit (HOSPITAL_COMMUNITY)
Admission: RE | Admit: 2014-04-10 | Discharge: 2014-04-10 | Disposition: A | Discharge status: Home / Self Care | Payer: Worker's Compensation | Source: Ambulatory Visit | Attending: Surgery | Admitting: Surgery

## 2014-04-10 DIAGNOSIS — IMO0001 Reserved for inherently not codable concepts without codable children: Secondary | ICD-10-CM | POA: Diagnosis not present

## 2014-04-10 NOTE — Progress Notes (Signed)
Physical Therapy Treatment Patient Details  Name: Kylie Tucker MRN: [id number] Date of Birth: 05-22-1951  Today's Date: 04/10/2014 Time: 1525-1610 PT Time Calculation (min): 45 min Manual 1525-1610 Visit#: 35 of 37  Re-eval: 04/19/14    Subjective: Symptoms/Limitations Symptoms: Pt states that she still has not recieved garment.   Pain Assessment Currently in Pain?: No/denies    Exercise/Treatments   Manual Therapy Other Manual Therapy: manual techniques used including supraclavicular, deep and superfical abdominal followed by routing fluid using intraaxillary anastomosis as well as Rt inguinal-axillary pathway. Manual completed both anterior and posteriorly . UE moisturized then wrapped using 1/2" foam and short stretch bandaging   Physical Therapy Assessment and Plan PT Assessment and Plan Clinical Impression Statement: Pt has noted increased congestion but took bandages off to wash them yesterday.  Extra time spent in subaxillary area.  PT Plan: continue with decongestive techniques until pt recieves day garment then D/C.          Problem List Patient Active Problem List   Diagnosis Date Noted  . Lymphedema of arm 12/13/2013  . Complete rupture of rotator cuff 11/22/2013  . Nipple discharge in female 12/01/2012  . History of breast cancer 11/30/2012  . History of right mastectomy, T1, N1, 11/13/2002.   Marland Kitchen FATIGUE 10/01/2009  . 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 DYSPNEA ON EXERTION 10/01/2009  . HYPERLIPIDEMIA 09/30/2009       GP    Nakota Elsen,CINDY 04/10/2014, 4:51 PM

## 2014-04-12 ENCOUNTER — Ambulatory Visit (HOSPITAL_COMMUNITY)
Admission: RE | Admit: 2014-04-12 | Discharge: 2014-04-12 | Disposition: A | Discharge status: Home / Self Care | Payer: Worker's Compensation | Source: Ambulatory Visit | Attending: Family Medicine | Admitting: Family Medicine

## 2014-04-12 DIAGNOSIS — I890 Lymphedema, not elsewhere classified: Secondary | ICD-10-CM | POA: Insufficient documentation

## 2014-04-12 DIAGNOSIS — M5489 Other dorsalgia: Secondary | ICD-10-CM | POA: Diagnosis not present

## 2014-04-12 DIAGNOSIS — Z5189 Encounter for other specified aftercare: Secondary | ICD-10-CM | POA: Diagnosis not present

## 2014-04-12 NOTE — Progress Notes (Signed)
Physical Therapy Treatment Patient Details  Name: Kylie Tucker MRN: [id number] Date of Birth: [birth year]  Today's Date: 04/12/2014 Time: 1520-1614 PT Time Calculation (min): 71 min Charge:  Manual 3664-4034  Visit#: 36 of 37  Re-eval: 04/19/14    Authorization: work comp (due to unable to to self massage/don pump or bandage with Lt UE due to rotator cuff repair)  Subjective: Symptoms/Limitations Symptoms: Pt is still waiting for her compression sleeve.  Pain Assessment Currently in Pain?: No/denies      Exercise/Treatments      Manual Therapy Other Manual Therapy: manual techniques used including supraclavicular, deep and superfical abdominal followed by routing fluid using intraaxillary anastomosis as well as Rt inguinal-axillary pathway. Manual completed both anterior and posteriorly . UE moisturized then wrapped using 1/2" foam and short stretch bandaging   Physical Therapy Assessment and Plan PT Assessment and Plan Clinical Impression Statement: Pt remeasured today with only a 10 cc decrease in volume vs. last Friday.  Pt urged to self massage when she takes the bandages off to wash.  PT Plan: continue with decongestive techniques until pt recieves day garment then D/C.      Goals  progressing   Problem List Patient Active Problem List   Diagnosis Date Noted  . Lymphedema of arm 12/13/2013  . Complete rupture of rotator cuff 11/22/2013  . Nipple discharge in female 12/01/2012  . History of breast cancer 11/30/2012  . History of right mastectomy, T1, N1, 11/13/2002.   Marland Kitchen FATIGUE 10/01/2009  . 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 DYSPNEA ON EXERTION 10/01/2009  . HYPERLIPIDEMIA 09/30/2009       GP    RUSSELL,CINDY 04/12/2014, 5:06 PM

## 2014-04-16 ENCOUNTER — Ambulatory Visit (HOSPITAL_COMMUNITY)
Admission: RE | Admit: 2014-04-16 | Discharge: 2014-04-16 | Disposition: A | Discharge status: Home / Self Care | Payer: Worker's Compensation | Source: Ambulatory Visit | Attending: Surgery | Admitting: Surgery

## 2014-04-16 DIAGNOSIS — Z5189 Encounter for other specified aftercare: Secondary | ICD-10-CM | POA: Diagnosis not present

## 2014-04-16 NOTE — Progress Notes (Signed)
Physical Therapy Treatment Patient Details  Name: Kylie Tucker MRN: [id number] Date of Birth: Jan 20, 1951  Today's Date: 04/16/2014 Time: 1150-1228 Visit#: 10 of 53  Re-eval: 04/19/14 Authorization: work comp (due to unable to to self massage/don pump or bandage with Lt UE due to rotator cuff repair)  Charges:  Manual 38  Subjective: Symptoms/Limitations Symptoms: Pt states she still has not heard back from [place].  suggested she call her.  States she bandaged her arm over the weekend and just removed the bandage prior to appointment this morning. Pain Assessment Currently in Pain?: No/denies  Objective: Manual Therapy Other Manual Therapy: manual techniques used including supraclavicular, deep and superfical abdominal followed by routing fluid using intraaxillary anastomosis as well as Rt inguinal-axillary pathway. Manual completed both anterior and posteriorly . UE moisturized then wrapped using 1/2" foam and short stretch bandaging  Physical Therapy Assessment and Plan PT Assessment and Plan Clinical Impression Statement: Contacted fitter regarding delay in receiving compression garment.  Pt reports compliance with reidsleeve at night, however states it is sliding.  Discussed with fitter who states she is not wearing it tight enough on her arm and is to check with the delay in her garment.  Relayed message to patient.  continued MLD and bandaging for Rt UE.   PT Plan: Re-evaluate next visit; anticipate discharge to [facility] as patient is now bandaging Rt UE independently.       Problem List Patient Active Problem List   Diagnosis Date Noted  . Lymphedema of arm 12/13/2013  . Complete rupture of rotator cuff 11/22/2013  . Nipple discharge in female 12/01/2012  . History of breast cancer 11/30/2012  . History of right mastectomy, T1, N1, 11/13/2002.   Marland Kitchen FATIGUE 10/01/2009  . 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 DYSPNEA ON EXERTION 10/01/2009  . HYPERLIPIDEMIA 09/30/2009       Teena Irani, PTA/CLT 04/16/2014, 2:33 PM

## 2014-04-18 ENCOUNTER — Ambulatory Visit (HOSPITAL_COMMUNITY)
Admission: RE | Admit: 2014-04-18 | Discharge: 2014-04-18 | Disposition: A | Discharge status: Home / Self Care | Payer: Worker's Compensation | Source: Ambulatory Visit | Attending: Surgery | Admitting: Surgery

## 2014-04-18 DIAGNOSIS — Z5189 Encounter for other specified aftercare: Secondary | ICD-10-CM | POA: Diagnosis not present

## 2014-04-18 NOTE — Progress Notes (Signed)
Physical Therapy Treatment Patient Details  Name: Kylie Tucker MRN: [id number] Date of Birth: [birth year]  Today's Date: 04/18/2014 Time: 1150-1237 PT Time Calculation (min): 47 min Charge: manual [id number]  Visit#: 38 of 24  Re-eval: 04/19/14    Authorization: work comp (due to unable to to self massage/don pump or bandage with Lt UE due to rotator cuff repair)    Subjective: Symptoms/Limitations Symptoms: Pt to department very upset as her garment has taken a long time to come in, the garment came in and it is the wrong garment.   Pt has contacted [employer], ( company that supplied the garment).  Pain Assessment Currently in Pain?: No/denies   Exercise/Treatments  Manual Therapy Other Manual Therapy: manual techniques used including supraclavicular, deep and superfical abdominal followed by routing fluid using intraaxillary anastomosis as well as Rt inguinal-axillary pathway. Manual completed both anterior and posteriorly .  Pt did not bring in enough short stretch bandages nor did she bring in foam.  UE wrapped in cotton and then used short stretch bandages from hand to shoulder.   Therapist suggested pt to have husband use one or two more short stretch bandages to increase compression.  Pt to be reassessed next visit.    Physical Therapy Assessment and Plan PT Assessment and Plan Clinical Impression Statement: Pt recieved wrong compression garment.  Pt to contact [place].  Pt unable to be wrapped properly secondary to pt not bringing foam or correct number of short stretch bandages, (Pt states she was so upset over not getting the correct garment she just wasnt thinking.)   PT Plan: Re-evaluated next visit; anticipate discharge to HEP    Problem List Patient Active Problem List   Diagnosis Date Noted  . Lymphedema of arm 12/13/2013  . Complete rupture of rotator cuff 11/22/2013  . Nipple discharge in female 12/01/2012  . History of breast cancer 11/30/2012  .  History of right mastectomy, T1, N1, 11/13/2002.   Marland Kitchen FATIGUE 10/01/2009  . 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 DYSPNEA ON EXERTION 10/01/2009  . HYPERLIPIDEMIA 09/30/2009       GP    RUSSELL,CINDY 04/18/2014, 3:18 PM

## 2014-04-23 ENCOUNTER — Ambulatory Visit (HOSPITAL_COMMUNITY)
Admission: RE | Admit: 2014-04-23 | Discharge: 2014-04-23 | Disposition: A | Discharge status: Home / Self Care | Payer: Worker's Compensation | Source: Ambulatory Visit | Attending: Surgery | Admitting: Surgery

## 2014-04-23 DIAGNOSIS — Z5189 Encounter for other specified aftercare: Secondary | ICD-10-CM | POA: Diagnosis not present

## 2014-04-23 NOTE — Progress Notes (Signed)
Physical Therapy Treatment Patient Details  Name: Kylie Tucker MRN: [id number] Date of Birth: 29-May-1951  Today's Date: 04/23/2014 Time: 1525-1628 PT Time Calculation (min): 63 min Charge manual [id number] Visit#: 7 of 40  Re-eval: 04/19/14    Authorization: work comp (due to unable to to self massage/don pump or bandage with Lt UE due to rotator cuff repair)  Subjective: Symptoms/Limitations Symptoms: Pt states that she has not been able to wrap herself with her compression garments. She states when she can she has her husband assist her but she is not sure how effective,(tight) they are getting the bandages.   04/12/2014 04/23/2014 04/23/2014  Rt Rt LT   18.6 19.4 18.5  16.20 15.80 26.50  19.00 18.00 17.60  21.00 20.90 20.00  23.80 23.70 24.40  26.30 25.80 25.00  27.60 28.40 26.20  28.80 29.40 26.50  32.00 33.00 29.00  35.30 36.50 33  38.00 37.90 35.30                                  7973.82 8142.72 7539.40  [id number] 2591.9092 9381.0175       Exercise/Treatments          Manual Therapy Other Manual Therapy: manual techniques used including supraclavicular, deep and superfical abdominal followed by routing fluid using intraaxillary anastomosis as well as Rt inguinal-axillary pathway. Manual completed both anterior and posteriorly . UE moisturized then wrapped using 1/2" foam and short stretch bandaging  Physical Therapy Assessment and Plan PT Assessment and Plan Clinical Impression Statement: Pt swelling has increased approximately 100 cc from last measurent.  Pt encouraged to be completing self massaging techniques.  Pt has increased edema along posterior aspect of scapula.  Pt encouraged to obtain ASSET compression and see if this decreased localized swelling.  Pt will go without therapy for one week and will return to be remeasured to assess if coming to therapy is helping any longer as the last three weeks pt volume has been about the same.  PT Plan:  Remeasure again next week if volume is within 100cc discharge to HEP.  Pt has night garment, is speaking to company about pump and is hopefully obtaining the correct day garment.  Speak to pt about her old compression garment ant the possibility of wearing the old sleeve until she obtains her new sleeve.      Goals    Problem List Patient Active Problem List   Diagnosis Date Noted  . Lymphedema of arm 12/13/2013  . Complete rupture of rotator cuff 11/22/2013  . Nipple discharge in female 12/01/2012  . History of breast cancer 11/30/2012  . History of right mastectomy, T1, N1, 11/13/2002.   Marland Kitchen FATIGUE 10/01/2009  . 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 DYSPNEA ON EXERTION 10/01/2009  . HYPERLIPIDEMIA 09/30/2009       GP    Shanaya Schneck,CINDY 04/23/2014, 5:10 PM

## 2014-04-25 ENCOUNTER — Ambulatory Visit (HOSPITAL_COMMUNITY): Payer: Self-pay | Admitting: Physical Therapy

## 2014-04-30 ENCOUNTER — Ambulatory Visit (HOSPITAL_COMMUNITY)
Admission: RE | Admit: 2014-04-30 | Discharge: 2014-04-30 | Disposition: A | Discharge status: Home / Self Care | Payer: Worker's Compensation | Source: Ambulatory Visit | Attending: Surgery | Admitting: Surgery

## 2014-04-30 DIAGNOSIS — Z5189 Encounter for other specified aftercare: Secondary | ICD-10-CM | POA: Diagnosis not present

## 2014-04-30 NOTE — Evaluation (Signed)
Physical Therapy Evaluation  Patient Details  Name: Kylie Tucker MRN: [id number] Date of Birth: 27-Feb-1951  Today's Date: 04/30/2014 Time: 1520-1605 PT Time Calculation (min): 45 min Charge manual              Visit#: 40 of 48  Re-eval: 05/21/14    Authorization: work comp (due to unable to to self massage/don pump or bandage with Lt UE due to rotator cuff repair)    Past Medical History:  Past Medical History  Diagnosis Date  . Cellulitis and abscess of upper arm and forearm   . Postmastectomy lymphedema rt side  . History of right mastectomy 2004    chemo/ radiation  . Migraines   . Cancer rt breast    2004/ chemo/ radiation  . Allergy   . Hyperlipidemia   . PONV (postoperative nausea and vomiting)     cannot take zofran  . GERD (gastroesophageal reflux disease)   . Sentinel node   . Anxiety    Past Surgical History:  Past Surgical History  Procedure Laterality Date  . Rt mastectomy  2004    lymph nodes-7-axillary node dissection  . Nasal sinus surgery  1990/2001  . Mandible surgery  1991  . Port-a-cath removal      in and now out  . Nasal sinus surgery    . Mouth surgery    . Orif finger fracture  02/14/2012    Procedure: OPEN REDUCTION INTERNAL FIXATION (ORIF) METACARPAL (FINGER) FRACTURE;  Surgeon: Kylie Sours, MD;  Location: [facility];  Service: Orthopedics;  Laterality: Right;  RIGHT FIFTH   . Breast lumpectomy  2004     br bx  . Orif metacarpal fracture  8/13    rt   . Repair extensor tendon Left 05/30/2013    Procedure: RELEASE TRANSPOSITION EXTENSOR POLLICUS LONGUS LEFT WRIST;  Surgeon: Kylie Sours, MD;  Location: [facility];  Service: Orthopedics;  Laterality: Left;  . Breast lumpectomy with axillary lymph node dissection    . Carpal tunnel release    . Wrist arthroscopy Left 2014  . Shoulder arthroscopy with rotator cuff repair and subacromial decompression Left 11/22/2013    Procedure: LEFT SHOULDER ARTHROSCOPY WITH  SUBACROMIAL DECOMPRESSION DISTAL CLAVICLE RESECTION REPAIR  ROTATOR CUFF AS NEEDED ;  Surgeon: Kylie Tucker., MD;  Location: [facility];  Service: Orthopedics;  Laterality: Left;    Subjective Symptoms/Limitations Symptoms: Pt states she has been self massaging her arm and wrapping it the best she can and she does feel like her swelling in her arm maybe down some.  Pt states that her level of discomfort from the swelling in the posterior thoracic area has increased greatly since not coming to theapy.  Pain Assessment Currently in Pain?: Yes Pain Score: 5  Pain Location: Back Pain Orientation: Right;Upper     Assessment   04/23/2014 04/23/2014 04/30/2014  Rt LT  RT   19.4 18.5 18  15.80 26.50 15.70  18.00 17.60 17.70  20.90 20.00 19.90  23.70 24.40 24.60  25.80 25.00 26.90  28.40 26.20 27.00  29.40 26.50 29.90  33.00 29.00 33.30  36.50 33 36.20  37.90 35.30 37.50                                  8142.72 7539.40 8057.15  2025.4270 6237.6283 1517.6160    Exercise/Treatments   Manual Therapy Other Manual Therapy:  manual techniques used including supraclavicular, deep and superfical abdominal followed by routing fluid using intraaxillary anastomosis as well as Rt inguinal-axillary pathway. Manual completed both anterior and posteriorly . UE moisturized then wrapped using 1/2" foam and short stretch bandaging  Physical Therapy Assessment and Plan PT Assessment and Plan Clinical Impression Statement: Pt swelling decreased by 35 cc with pt doing self wrapping and massaging.  Pt states that she had significant discomfort with thoracic area however and she can not get to the thoracic area.  Educated about diaphragmic breathing and asked pt if her husbanc would come to the next several sessions to learn how to complete manual techniques which she agreed upon.   Therapist has also recommended [employer] garment in the past to patient to provide comprssion in  thoracic area.  Therapist suggested several department stores where pt can puchase garment.  PT Frequency: Min 3X/week PT Duration:  (2-3 weeks) PT Plan: Educate husband in manual decongestive techniques with emphasis on posterior thoracic area.     Goals PT Short Term Goals PT Short Term Goal 1: Decrease volume by 30% PT Short Term Goal 1 - Progress: Met PT Long Term Goals PT Long Term Goal 1: Pt volume to be decreased by 60% PT Long Term Goal 1 - Progress: Progressing toward goal  Problem List Patient Active Problem List   Diagnosis Date Noted  . Lymphedema of arm 12/13/2013  . Complete rupture of rotator cuff 11/22/2013  . Nipple discharge in female 12/01/2012  . History of breast cancer 11/30/2012  . History of right mastectomy, T1, N1, 11/13/2002.   Marland Kitchen FATIGUE 10/01/2009  . 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 DYSPNEA ON EXERTION 10/01/2009  . HYPERLIPIDEMIA 09/30/2009       GP    Kylie Tucker 04/30/2014, 5:12 PM  Physician Documentation Your signature is required to indicate approval of the treatment plan as stated above.  Please sign and either send electronically or make a copy of this report for your files and return this physician signed original.   Please mark one 1.__approve of plan  2. ___approve of plan with the following conditions.   ______________________________                                                          _____________________ Physician Signature                                                                                                             Date

## 2014-05-02 ENCOUNTER — Ambulatory Visit (HOSPITAL_COMMUNITY)
Admission: RE | Admit: 2014-05-02 | Discharge: 2014-05-02 | Disposition: A | Discharge status: Home / Self Care | Payer: Worker's Compensation | Source: Ambulatory Visit | Attending: Physical Therapy | Admitting: Physical Therapy

## 2014-05-02 DIAGNOSIS — Z5189 Encounter for other specified aftercare: Secondary | ICD-10-CM | POA: Diagnosis not present

## 2014-05-03 NOTE — Progress Notes (Signed)
Physical Therapy Treatment Patient Details  Name: Kylie Tucker MRN: [id number] Date of Birth: [birth year]  Today's Date: 05/03/2014 Time: 1522-1604 PT Time Calculation (min): 42 min Charges:  Manual 42  Visit#: 41 of 48  Re-eval: 05/21/14 Authorization: work comp (due to unable to to self massage/don pump or bandage with Lt UE due to rotator cuff repair)    Subjective: Symptoms/Limitations Symptoms: Pt states she has not heard back from [employer], the fitter.  Pt did not bring her husband with her today for massage education.  Pt states she has appointment with [facility] to see about getting compression in her trunk area.  Pt with sciatica today making mobility uncomfortable.  Pain Assessment Currently in Pain?: Yes Pain Score: 8  Pain Location: Back Pain Orientation: Right;Mid   Objective:  Manual Therapy Other Manual Therapy: manual techniques used including supraclavicular, deep and superfical abdominal followed by routing fluid using intraaxillary anastomosis as well as Rt inguinal-axillary pathway. Manual completed both anterior and posteriorly . UE moisturized then wrapped using 1/2" foam and short stretch bandaging  Physical Therapy Assessment and Plan PT Assessment and Plan Clinical Impression Statement: suggested patient inquire about a latex/silicone free sleeve at [facility].  Pt reports she has one and cannot remember why she quit wearing it.  Reminded patient to bring husband to treatments to instruct on massage.  Pt verbalized understanding. PT Frequency: Min 3X/week PT Duration:  (2-3 weeks) PT Plan: Educate husband in manual decongestive techniques with emphasis on posterior thoracic area. Continue complete decongestive therapy.     Problem List Patient Active Problem List   Diagnosis Date Noted  . Lymphedema of arm 12/13/2013  . Complete rupture of rotator cuff 11/22/2013  . Nipple discharge in female 12/01/2012  . History of breast  cancer 11/30/2012  . History of right mastectomy, T1, N1, 11/13/2002.   Marland Kitchen FATIGUE 10/01/2009  . 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 DYSPNEA ON EXERTION 10/01/2009  . HYPERLIPIDEMIA 09/30/2009      Teena Irani, PTA/CLT 05/03/2014, 2:38 PM

## 2014-05-07 ENCOUNTER — Ambulatory Visit (HOSPITAL_COMMUNITY): Payer: Worker's Compensation | Admitting: Physical Therapy

## 2014-05-08 ENCOUNTER — Other Ambulatory Visit: Payer: Self-pay | Admitting: Ophthalmology

## 2014-05-08 ENCOUNTER — Other Ambulatory Visit (HOSPITAL_COMMUNITY): Payer: Self-pay | Admitting: Physician Assistant

## 2014-05-08 DIAGNOSIS — J018 Other acute sinusitis: Secondary | ICD-10-CM

## 2014-05-09 ENCOUNTER — Ambulatory Visit (HOSPITAL_COMMUNITY)
Admission: RE | Admit: 2014-05-09 | Discharge: 2014-05-09 | Disposition: A | Discharge status: Home / Self Care | Payer: Worker's Compensation | Source: Ambulatory Visit | Attending: Surgery | Admitting: Surgery

## 2014-05-09 DIAGNOSIS — Z5189 Encounter for other specified aftercare: Secondary | ICD-10-CM | POA: Diagnosis not present

## 2014-05-09 NOTE — Progress Notes (Signed)
Physical Therapy Discharge  Patient Details  Name: Kylie Tucker MRN: [id number] Date of Birth: Jan 03, 1951  Today's Date: 05/09/2014 Time: 1520-1555 PT Time Calculation (min): 35 min           Visit#: 42 of 48  Re-eval: 05/21/14 Charges:  Manual 35     Subjective Symptoms/Limitations Symptoms: Pt states she is bandaging her own UE now and feels she is ready to be discharged from therapy.  States he is no longer having sciatica. Pain Assessment Currently in Pain?: No/denies  Objective: 04/23/2014  04/23/2014  04/30/2014   Rt  LT  RT   19.4  18.5  18   15.80  26.50  15.70   18.00  17.60  17.70   20.90  20.00  19.90   23.70  24.40  24.60   25.80  25.00  26.90   28.40  26.20  27.00   29.40  26.50  29.90   33.00  29.00  33.30   36.50  33  36.20   37.90  35.30  37.50           8142.72  7539.40  8057.15   1937.9024  0973.5329  [phone]     Manual decongestive therapy completed for Rt UE routing fluid to Lt axillary and Rt inguinal nodes.  Bandaging was not completed today as patient did not bring them to session.  Pt instructed to self bandage when returns home.   Physical Therapy Assessment and Plan PT Assessment and Plan Clinical Impression Statement: Pt has completed complete decongestive therapy X 4 months (42 sessions) with overall reduction of 24%.  Pt with overall loss of 52.52 cc since initial evaluation on 12/13/13.  Pt is now wearing her reidsleeve at night and self bandaging during the day independently.  Pt was unsuccessful in finding a sleeve that was comfortable and did not have silicone or latex so has decided to continue self bandaging.  Pt additionally has an intermittent sequential compression pump she uses as well. PT Plan: Recommend Discharge to home maintenance per patient request/therapist discussion.      Goals PT Short Term Goals PT Short Term Goal 1: Decrease volume by 30% PT Short Term Goal 1 - Progress: Not met (volume decreased 24% total) PT  Long Term Goals PT Long Term Goal 1: Pt volume to be decreased by 60% PT Long Term Goal 1 - Progress: Not met  Problem List Patient Active Problem List   Diagnosis Date Noted  . Lymphedema of arm 12/13/2013  . Complete rupture of rotator cuff 11/22/2013  . Nipple discharge in female 12/01/2012  . History of breast cancer 11/30/2012  . History of right mastectomy, T1, N1, 11/13/2002.   Marland Kitchen FATIGUE 10/01/2009  . 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 DYSPNEA ON EXERTION 10/01/2009  . HYPERLIPIDEMIA 09/30/2009      Kylie Tucker, PTA/CLT 05/09/2014, 4:15 PM

## 2014-05-10 ENCOUNTER — Ambulatory Visit (HOSPITAL_COMMUNITY)
Admission: RE | Admit: 2014-05-10 | Discharge: 2014-05-10 | Disposition: A | Discharge status: Home / Self Care | Payer: 59 | Source: Ambulatory Visit | Attending: Physician Assistant | Admitting: Physician Assistant

## 2014-05-10 DIAGNOSIS — R51 Headache: Secondary | ICD-10-CM | POA: Insufficient documentation

## 2014-05-10 DIAGNOSIS — J018 Other acute sinusitis: Secondary | ICD-10-CM

## 2014-05-13 ENCOUNTER — Encounter (HOSPITAL_BASED_OUTPATIENT_CLINIC_OR_DEPARTMENT_OTHER): Payer: Self-pay | Admitting: Orthopedic Surgery

## 2014-06-19 ENCOUNTER — Other Ambulatory Visit (HOSPITAL_COMMUNITY): Payer: Self-pay

## 2014-06-24 ENCOUNTER — Encounter (HOSPITAL_COMMUNITY): Admission: RE | Payer: Self-pay | Source: Ambulatory Visit

## 2014-06-24 ENCOUNTER — Ambulatory Visit (HOSPITAL_COMMUNITY): Admission: RE | Admit: 2014-06-24 | Payer: 59 | Source: Ambulatory Visit | Admitting: Ophthalmology

## 2014-06-24 SURGERY — PARS PLANA VITRECTOMY WITH 25 GAUGE
Anesthesia: Monitor Anesthesia Care | Laterality: Right

## 2014-07-12 DIAGNOSIS — I447 Left bundle-branch block, unspecified: Secondary | ICD-10-CM

## 2014-07-12 HISTORY — DX: Left bundle-branch block, unspecified: I44.7

## 2014-08-23 ENCOUNTER — Ambulatory Visit: Payer: 59 | Admitting: Obstetrics and Gynecology

## 2014-08-26 ENCOUNTER — Ambulatory Visit: Payer: Self-pay | Admitting: Obstetrics and Gynecology

## 2014-09-10 ENCOUNTER — Encounter: Payer: Self-pay | Admitting: Nurse Practitioner

## 2014-09-10 ENCOUNTER — Ambulatory Visit (INDEPENDENT_AMBULATORY_CARE_PROVIDER_SITE_OTHER): Payer: 59 | Admitting: Nurse Practitioner

## 2014-09-10 VITALS — BP 122/70 | HR 82 | Ht 62.75 in | Wt 142.4 lb

## 2014-09-10 DIAGNOSIS — Z01419 Encounter for gynecological examination (general) (routine) without abnormal findings: Secondary | ICD-10-CM

## 2014-09-10 DIAGNOSIS — Z0000 Encounter for general adult medical examination without abnormal findings: Secondary | ICD-10-CM

## 2014-09-10 LAB — POCT URINALYSIS DIPSTICK
Leukocytes, UA: NEGATIVE
PH UA: 5
Urobilinogen, UA: NEGATIVE

## 2014-09-10 MED ORDER — VENLAFAXINE HCL ER 37.5 MG PO CP24: 37.5000 mg | ORAL_CAPSULE | Freq: Every day | ORAL | Status: DC

## 2014-09-10 NOTE — Progress Notes (Signed)
64 y.o. G3P2 Married  Caucasian Fe here for annual exam. Overall is doing well.  She is no longer using the Vagifem - felt like she had more vaginal discharge than she felt comfortable with.  Does use OTC lubrication for SA.  No dyspareunia.  She had surgery for shoulder scope 5 2015.  Since then but thinks not related, she has been having sleeping problems.  This maybe has been as long as 1-2 years.  She has a sleep study which was negative for sleep apnea.   Unable to go sleep.  Then awakens at 3-4 am and unable to go back to sleep.   Patient's last menstrual period was 01/10/2003.          Sexually active: Yes.    The current method of family planning is post menopausal status.    Exercising: Yes.    cardio 5x/wk Smoker:  no  Health Maintenance: Pap:  08/01/12 NEG  HR HPV negative  MMG:  03/26/14 Breast Density Category C; Bi-Rads 1: negative  Colonoscopy:  2015- 1 polyp f/u in 2021 BMD:   06/26/12 T Score spine -1.7; left hip neck -1.6 TDaP:  07/12/2004 Labs done by Michaelene Song, MD ; Urine: Negative    reports that she has never smoked. She has never used smokeless tobacco. She reports that she does not drink alcohol or use illicit drugs.  Past Medical History  Diagnosis Date  . Cellulitis and abscess of upper arm and forearm   . Postmastectomy lymphedema rt side  . History of right mastectomy 2004    chemo/ radiation  . Migraines   . Cancer rt breast    2004/ chemo/ radiation  . Allergy   . Hyperlipidemia   . PONV (postoperative nausea and vomiting)     cannot take zofran  . GERD (gastroesophageal reflux disease)   . Sentinel node   . Anxiety     Past Surgical History  Procedure Laterality Date  . Rt mastectomy  2004    lymph nodes-7-axillary node dissection  . Nasal sinus surgery  1990/2001  . Mandible surgery  1991  . Port-a-cath removal      in and now out  . Nasal sinus surgery    . Mouth surgery    . Orif finger fracture  02/14/2012    Procedure: OPEN REDUCTION  INTERNAL FIXATION (ORIF) METACARPAL (FINGER) FRACTURE;  Surgeon: Wynonia Sours, MD;  Location: [facility];  Service: Orthopedics;  Laterality: Right;  RIGHT FIFTH   . Breast lumpectomy  2004     br bx  . Orif metacarpal fracture  8/13    rt   . Repair extensor tendon Left 05/30/2013    Procedure: RELEASE TRANSPOSITION EXTENSOR POLLICUS LONGUS LEFT WRIST;  Surgeon: Wynonia Sours, MD;  Location: [facility];  Service: Orthopedics;  Laterality: Left;  . Breast lumpectomy with axillary lymph node dissection    . Carpal tunnel release    . Wrist arthroscopy Left 2014  . Shoulder arthroscopy with rotator cuff repair and subacromial decompression Left 11/22/2013    Procedure: LEFT SHOULDER ARTHROSCOPY WITH SUBACROMIAL DECOMPRESSION DISTAL CLAVICLE RESECTION REPAIR  ROTATOR CUFF AS NEEDED ;  Surgeon: Cammie Sickle., MD;  Location: [facility];  Service: Orthopedics;  Laterality: Left;  . Cataract extraction Left 07/12/2013    Current Outpatient Prescriptions  Medication Sig Dispense Refill  . ALPRAZolam (XANAX XR) 0.5 MG 24 hr tablet Take 0.5 mg by mouth 2 (two)  times daily as needed. For anxiety     . calcium carbonate (OS-CAL) 600 MG TABS Take 600 mg by mouth 2 (two) times daily with a meal.      . Cholecalciferol (VITAMIN D) 2000 UNITS CAPS Take 1 capsule by mouth daily.     . cyclobenzaprine (FLEXERIL) 10 MG tablet Take 10 mg by mouth daily as needed. For pain    . eletriptan (RELPAX) 40 MG tablet Take 40 mg by mouth as needed for migraine or headache. One tablet by mouth at onset of headache. May repeat in 2 hours if headache persists or recurs.    Marland Kitchen ezetimibe (ZETIA) 10 MG tablet Take 10 mg by mouth at bedtime.     . fluticasone (FLONASE) 50 MCG/ACT nasal spray Place 2 sprays into the nose daily.     Marland Kitchen ketotifen (REFRESH EYE ITCH RELIEF) 0.025 % ophthalmic solution Place 1 drop into both eyes 2 (two) times daily.    . Multiple Vitamins-Minerals  (MULTIVITAMINS THER. W/MINERALS) TABS Take 1 tablet by mouth daily.      . Olopatadine HCl (PATADAY) 0.2 % SOLN Apply 1 drop to eye daily.    Marland Kitchen oxyCODONE (OXY IR/ROXICODONE) 5 MG immediate release tablet 1 or 2 tabs every 4 hours as needed for pain 30 tablet 0  . pantoprazole (PROTONIX) 40 MG tablet Take 40 mg by mouth daily as needed (for GERD).     . Probiotic Product (PROBIOTIC DAILY PO) Take by mouth.    . promethazine (PHENERGAN) 25 MG tablet Take 25 mg by mouth every 8 (eight) hours as needed.     . venlafaxine XR (EFFEXOR-XR) 37.5 MG 24 hr capsule Take 1 capsule (37.5 mg total) by mouth daily with breakfast. 30 capsule 11  . Estradiol (VAGIFEM) 10 MCG TABS vaginal tablet Place 1 tablet (10 mcg total) vaginally 2 (two) times a week. Insert nightly for 2w, twice a week thereafter (Patient not taking: Reported on 09/10/2014) 18 tablet 4   No current facility-administered medications for this visit.    Family History  Problem Relation Age of Onset  . Heart disease Father   . Cancer Maternal Aunt     breast  . Heart disease Paternal Uncle   . Heart disease Paternal Grandmother   . Heart disease Paternal Grandfather   . Heart disease Mother   . Hypertension Mother   . Diabetes Brother     ROS:  Pertinent items are noted in HPI.  Otherwise, a comprehensive ROS was negative.  Exam:   BP 122/70 mmHg  Pulse 82  Ht 5' 2.75" (1.594 m)  Wt 142 lb 6.4 oz (64.592 kg)  BMI 25.42 kg/m2  LMP 01/10/2003 Height: 5' 2.75" (159.4 cm) Ht Readings from Last 3 Encounters:  09/10/14 5' 2.75" (1.594 m)  11/22/13 5' 2.25" (1.581 m)  11/20/13 5' 2.5" (1.588 m)    General appearance: alert, cooperative and appears stated age Head: Normocephalic, without obvious abnormality, atraumatic Neck: no adenopathy, supple, symmetrical, trachea midline and thyroid normal to inspection and palpation Lungs: clear to auscultation bilaterally Breasts: normal appearance, no masses or tenderness, positive  findings: right total mastectomy, no mass on the left. Heart: regular rate and rhythm Abdomen: soft, non-tender; no masses,  no organomegaly Extremities: extremities normal, atraumatic, no cyanosis or edema Skin: Skin color, texture, turgor normal. No rashes or lesions Lymph nodes: Cervical, supraclavicular, and axillary nodes normal. No abnormal inguinal nodes palpated Neurologic: Grossly normal   Pelvic: External genitalia:  no lesions  Urethra:  normal appearing urethra with no masses, tenderness or lesions              Bartholin's and Skene's: normal                 Vagina: normal appearing vagina with normal color and discharge, no lesions              Cervix: anteverted              Pap taken: No. 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  Still under Oncologist care   Treatment for right arm lymphadenopathy.  Osteopenia  History of postcoital bleeding with negative endo biopsy & PUS 11/2013  Multiple drug allergies  Multiple Ortho problems  Insomnia - chronic  P:   Reviewed health and wellness pertinent to exam  Pap smear not taken today  Mammogram is due 9/16  Will not refill Vagifem at this time  Refill on Effexor 37.5 mg for a year  Counseled on breast self exam, mammography screening, adequate intake of calcium and vitamin D, diet and exercise return annually or prn  An After Visit Summary was printed and given to the patient.

## 2014-09-10 NOTE — Patient Instructions (Signed)

## 2014-09-11 NOTE — Progress Notes (Signed)
Encounter reviewed by Dr. Lincoln Ginley Silva.  

## 2014-10-15 ENCOUNTER — Ambulatory Visit (HOSPITAL_BASED_OUTPATIENT_CLINIC_OR_DEPARTMENT_OTHER): Payer: 59 | Admitting: Oncology

## 2014-10-15 ENCOUNTER — Telehealth: Payer: Self-pay | Admitting: Oncology

## 2014-10-15 ENCOUNTER — Other Ambulatory Visit (HOSPITAL_BASED_OUTPATIENT_CLINIC_OR_DEPARTMENT_OTHER): Payer: 59

## 2014-10-15 VITALS — BP 154/75 | HR 75 | Temp 98.1°F | Resp 18 | Ht 62.75 in | Wt 144.1 lb

## 2014-10-15 DIAGNOSIS — R0989 Other specified symptoms and signs involving the circulatory and respiratory systems: Secondary | ICD-10-CM

## 2014-10-15 DIAGNOSIS — Z170 Estrogen receptor positive status [ER+]: Secondary | ICD-10-CM

## 2014-10-15 DIAGNOSIS — Z9221 Personal history of antineoplastic chemotherapy: Secondary | ICD-10-CM | POA: Diagnosis not present

## 2014-10-15 DIAGNOSIS — C50911 Malignant neoplasm of unspecified site of right female breast: Secondary | ICD-10-CM

## 2014-10-15 DIAGNOSIS — Z79810 Long term (current) use of selective estrogen receptor modulators (SERMs): Secondary | ICD-10-CM

## 2014-10-15 DIAGNOSIS — R0689 Other abnormalities of breathing: Secondary | ICD-10-CM

## 2014-10-15 DIAGNOSIS — Z853 Personal history of malignant neoplasm of breast: Secondary | ICD-10-CM

## 2014-10-15 DIAGNOSIS — Z923 Personal history of irradiation: Secondary | ICD-10-CM | POA: Diagnosis not present

## 2014-10-15 DIAGNOSIS — R0600 Dyspnea, unspecified: Secondary | ICD-10-CM

## 2014-10-15 DIAGNOSIS — Z9011 Acquired absence of right breast and nipple: Secondary | ICD-10-CM

## 2014-10-15 DIAGNOSIS — Z79811 Long term (current) use of aromatase inhibitors: Secondary | ICD-10-CM

## 2014-10-15 DIAGNOSIS — C50919 Malignant neoplasm of unspecified site of unspecified female breast: Secondary | ICD-10-CM

## 2014-10-15 DIAGNOSIS — I890 Lymphedema, not elsewhere classified: Secondary | ICD-10-CM

## 2014-10-15 LAB — CBC WITH DIFFERENTIAL/PLATELET
BASO%: 1.1 % (ref 0.0–2.0)
BASOS ABS: 0.1 10*3/uL (ref 0.0–0.1)
EOS ABS: 0.2 10*3/uL (ref 0.0–0.5)
EOS%: 3.2 % (ref 0.0–7.0)
HCT: 41.8 % (ref 34.8–46.6)
HEMOGLOBIN: 14 g/dL (ref 11.6–15.9)
LYMPH%: 36.5 % (ref 14.0–49.7)
MCH: 29.7 pg (ref 25.1–34.0)
MCHC: 33.5 g/dL (ref 31.5–36.0)
MCV: 88.7 fL (ref 79.5–101.0)
MONO#: 0.5 10*3/uL (ref 0.1–0.9)
MONO%: 7.3 % (ref 0.0–14.0)
NEUT#: 3.3 10*3/uL (ref 1.5–6.5)
NEUT%: 51.9 % (ref 38.4–76.8)
PLATELETS: 278 10*3/uL (ref 145–400)
RBC: 4.71 10*6/uL (ref 3.70–5.45)
RDW: 13.3 % (ref 11.2–14.5)
WBC: 6.3 10*3/uL (ref 3.9–10.3)
lymph#: 2.3 10*3/uL (ref 0.9–3.3)

## 2014-10-15 LAB — COMPREHENSIVE METABOLIC PANEL (CC13)
ALT: 16 U/L (ref 0–55)
AST: 19 U/L (ref 5–34)
Albumin: 3.9 g/dL (ref 3.5–5.0)
Alkaline Phosphatase: 87 U/L (ref 40–150)
Anion Gap: 10 mEq/L (ref 3–11)
BILIRUBIN TOTAL: 0.32 mg/dL (ref 0.20–1.20)
BUN: 15.1 mg/dL (ref 7.0–26.0)
CHLORIDE: 105 meq/L (ref 98–109)
CO2: 26 meq/L (ref 22–29)
Calcium: 9.2 mg/dL (ref 8.4–10.4)
Creatinine: 0.9 mg/dL (ref 0.6–1.1)
EGFR: 65 mL/min/{1.73_m2} — AB (ref >90)
Glucose: 95 mg/dl (ref 70–140)
POTASSIUM: 4.1 meq/L (ref 3.5–5.1)
Sodium: 142 mEq/L (ref 136–145)
TOTAL PROTEIN: 7.1 g/dL (ref 6.4–8.3)

## 2014-10-15 NOTE — Progress Notes (Signed)
ID: Kylie Tucker   DOB: 02/11/1951  MR#: [id number]  XTG#:[id number]  PCP: Glo Herring., MD GYN: Rolly Pancake SU:  OTHER MD: Daryll Brod   HISTORY OF PRESENT ILLNESS: From the original intake note:  The patient palpated a lump in the lower outer quadrant of the right breast.  Mammography and ultrasound were performed on 08-31-02.  Mammogram was unremarkable, just showing dense breast tissue.  Ultrasound showed a single 9 mm. lesion at the 9:00 position, 5 cm. from the nipple, as well as several adjacent simple cysts.  Core biopsy of the lesion confirmed an invasive mammary carcinoma.  She also had bilateral breast MRI performed on 09-10-02, and this showed multiple enhancing parenchymal nodules bilaterally.  On 09-17-02, Dr. Alphonsa Overall performed an excisional biopsy of the right breast mass as well as sentinel lymph node biopsy followed by completion axillary dissection.  The lumpectomy specimen contained multifocal invasive mammary carcinoma, at least three separate foci, measuring 1.2, 0.9 and 0.6 cm.  Invasive tumor came within 0.5 mm. of the caudal margin and 1 mm. of the cephalic margin.  The excisional biopsy specimen grossly measured 8.8 x 4.8 x 3.4 cm.  The tumor was tubulolobular.  The tumor was associated with lymphovascular invasion.  A total of two out of three sentinel lymph nodes were involved with metastatic adenocarcinoma, and from axillary dissection, one of four lymph nodes were involved, none within the extracapsular extension.  However, there was also an intramammary lymph node involved, for a total of four out of eight positive lymph nodes.  The tumor was ER positive at 47% and PR positive at 17%, had an elevation proliferation marker of 44%, and was HER-2/neu 3+ positive. Her subsequent history is as detailed below  INTERVAL HISTORY: [name] returns for followup of her breast cancer. The interval history is generally unremarkable. She exercises on a treadmill, does cardio,  and enjoys biking. She finally retired in June and she and her husband have been doing a lot of traveling.   REVIEW OF SYSTEMS: She has significant insomnia problems. These are not due to hot flashes or nocturia. She complains of hearing loss tinnitus and sinus problems. She has some aches and pains here and there but these are not more persistent or intense than before. She has a history of ocular migraines. Again these are not changed. A detailed review of systems today was noncontributory  PAST MEDICAL HISTORY: Past Medical History  Diagnosis Date  . Cellulitis and abscess of upper arm and forearm   . Postmastectomy lymphedema rt side  . History of right mastectomy 2004    chemo/ radiation  . Migraines   . Cancer rt breast    2004/ chemo/ radiation  . Allergy   . Hyperlipidemia   . PONV (postoperative nausea and vomiting)     cannot take zofran  . GERD (gastroesophageal reflux disease)   . Sentinel node   . Anxiety   She had some type of RT (?orthovoltage vs Cobalt) at [facility] in the late 70's to her lt. lower ribs to treat traumatic injury there, approx. 64 txs. No records available, no tattoos were left. Otherwise healthy except for some GE reflux.  She has undergone excision of breast cysts on the right in the 1980s and on the left in 2002.    PAST SURGICAL HISTORY: Past Surgical History  Procedure Laterality Date  . Rt mastectomy  2004    lymph nodes-7-axillary node dissection  . Nasal sinus surgery  1990/2001  .  Mandible surgery  1991  . Port-a-cath removal      in and now out  . Nasal sinus surgery    . Mouth surgery    . Orif finger fracture  02/14/2012    Procedure: OPEN REDUCTION INTERNAL FIXATION (ORIF) METACARPAL (FINGER) FRACTURE;  Surgeon: Wynonia Sours, MD;  Location: [facility];  Service: Orthopedics;  Laterality: Right;  RIGHT FIFTH   . Breast lumpectomy  2004     br bx  . Orif metacarpal fracture  8/13    rt   . Repair extensor tendon Left  05/30/2013    Procedure: RELEASE TRANSPOSITION EXTENSOR POLLICUS LONGUS LEFT WRIST;  Surgeon: Wynonia Sours, MD;  Location: [facility];  Service: Orthopedics;  Laterality: Left;  . Breast lumpectomy with axillary lymph node dissection    . Carpal tunnel release    . Wrist arthroscopy Left 2014  . Shoulder arthroscopy with rotator cuff repair and subacromial decompression Left 11/22/2013    Procedure: LEFT SHOULDER ARTHROSCOPY WITH SUBACROMIAL DECOMPRESSION DISTAL CLAVICLE RESECTION REPAIR  ROTATOR CUFF AS NEEDED ;  Surgeon: Cammie Sickle., MD;  Location: [facility];  Service: Orthopedics;  Laterality: Left;  . Cataract extraction Left 07/12/2013    FAMILY HISTORY Family History  Problem Relation Age of Onset  . Heart disease Father   . Cancer Maternal Aunt     breast  . Heart disease Paternal Uncle   . Heart disease Paternal Grandmother   . Heart disease Paternal Grandfather   . Heart disease Mother   . Hypertension Mother   . Diabetes Brother   Her mother had rectal cancer, negative for breast or ovarian cancer.  GYNECOLOGIC HISTORY: GX P2   SOCIAL HISTORY: Married, has two children, worked at [employer]  in [place], but retired June 2015   ADVANCED DIRECTIVES: Husband iis healthcare power of attorney  HEALTH MAINTENANCE: History  Substance Use Topics  . Smoking status: Never Smoker   . Smokeless tobacco: Never Used  . Alcohol Use: No     Colonoscopy:  PAP:  Bone density:  Lipid panel:  Allergies  Allergen Reactions  . Acetaminophen Nausea Only  . Amoxicillin-Pot Clavulanate Nausea Only    flushing  . Azithromycin     Heart palpitations   . Cefuroxime Axetil Nausea Only  . Cephalexin Nausea Only  . Clarithromycin Nausea Only  . Codeine Itching  . Hydrocodone-Acetaminophen Hives  . Levofloxacin Nausea Only  . Morphine And Related Other (See Comments)    Hallucinations  . Ondansetron Nausea And Vomiting  .  Propoxyphene N-Acetaminophen Nausea And Vomiting  . Rosuvastatin     REACTION: myalgia  . Sulfonamide Derivatives Itching  . Tramadol Nausea And Vomiting  . Vioxx [Rofecoxib] Nausea And Vomiting  . Zofran [Ondansetron Hcl] Nausea And Vomiting    Current Outpatient Prescriptions  Medication Sig Dispense Refill  . ALPRAZolam (XANAX XR) 0.5 MG 24 hr tablet Take 0.5 mg by mouth 2 (two) times daily as needed. For anxiety     . calcium carbonate (OS-CAL) 600 MG TABS Take 600 mg by mouth 2 (two) times daily with a meal.      . Cholecalciferol (VITAMIN D) 2000 UNITS CAPS Take 1 capsule by mouth daily.     . cyclobenzaprine (FLEXERIL) 10 MG tablet Take 10 mg by mouth daily as needed. For pain    . eletriptan (RELPAX) 40 MG tablet Take 40 mg by mouth as needed for migraine or headache.  One tablet by mouth at onset of headache. May repeat in 2 hours if headache persists or recurs.    . Estradiol (VAGIFEM) 10 MCG TABS vaginal tablet Place 1 tablet (10 mcg total) vaginally 2 (two) times a week. Insert nightly for 2w, twice a week thereafter (Patient not taking: Reported on 09/10/2014) 18 tablet 4  . ezetimibe (ZETIA) 10 MG tablet Take 10 mg by mouth at bedtime.     . fluticasone (FLONASE) 50 MCG/ACT nasal spray Place 2 sprays into the nose daily.     Marland Kitchen ketotifen (REFRESH EYE ITCH RELIEF) 0.025 % ophthalmic solution Place 1 drop into both eyes 2 (two) times daily.    . Multiple Vitamins-Minerals (MULTIVITAMINS THER. W/MINERALS) TABS Take 1 tablet by mouth daily.      . Olopatadine HCl (PATADAY) 0.2 % SOLN Apply 1 drop to eye daily.    Marland Kitchen oxyCODONE (OXY IR/ROXICODONE) 5 MG immediate release tablet 1 or 2 tabs every 4 hours as needed for pain 30 tablet 0  . pantoprazole (PROTONIX) 40 MG tablet Take 40 mg by mouth daily as needed (for GERD).     . Probiotic Product (PROBIOTIC DAILY PO) Take by mouth.    . promethazine (PHENERGAN) 25 MG tablet Take 25 mg by mouth every 8 (eight) hours as needed.     .  venlafaxine XR (EFFEXOR-XR) 37.5 MG 24 hr capsule Take 1 capsule (37.5 mg total) by mouth daily with breakfast. 90 capsule 3   No current facility-administered medications for this visit.    OBJECTIVE: Middle-aged white woman who appears well Filed Vitals:   10/15/14 1342  BP: 154/75  Pulse: 75  Temp: 98.1 F (36.7 C)  Resp: 18     Body mass index is 25.72 kg/(m^2).    ECOG FS: 1  Sclerae unicteric, pupils equal and reactive Oropharynx clear and moist-- no thrush or other lesions No cervical or supraclavicular adenopathy Lungs no rales or rhonchi Heart regular rate and rhythm Abd soft, nontender, positive bowel sounds MSK no focal spinal tenderness, no upper extremity lymphedema Neuro: nonfocal, well oriented, appropriate affect Breasts: The right breast is status post mastectomy. There is no evidence of chest wall recurrence. The right axilla is benign per the left chest is unremarkable.     LAB RESULTS: Lab Results  Component Value Date   WBC 6.3 10/15/2014   NEUTROABS 3.3 10/15/2014   HGB 14.0 10/15/2014   HCT 41.8 10/15/2014   MCV 88.7 10/15/2014   PLT 278 10/15/2014      Chemistry      Component Value Date/Time   NA 141 09/11/2013 1455   NA 142 06/28/2011 2200   K 4.7 09/11/2013 1455   K 3.9 06/28/2011 2200   CL 103 12/08/2012 0810   CL 105 06/28/2011 2200   CO2 31* 09/11/2013 1455   CO2 31 04/14/2011 0523   BUN 9.3 09/11/2013 1455   BUN 17 06/28/2011 2200   CREATININE 0.8 09/11/2013 1455   CREATININE 1.00 06/28/2011 2200      Component Value Date/Time   CALCIUM 10.0 09/11/2013 1455   CALCIUM 8.8 04/14/2011 0523   ALKPHOS 78 09/11/2013 1455   ALKPHOS 99 02/16/2011 1626   AST 18 09/11/2013 1455   AST 21 02/16/2011 1626   ALT 15 09/11/2013 1455   ALT 20 02/16/2011 1626   BILITOT 0.33 09/11/2013 1455   BILITOT 0.2* 02/16/2011 1626       Lab Results  Component Value Date   LABCA2 26 02/16/2011  No components found for: LABCA125  No  results for input(s): INR in the last 168 hours.  Urinalysis    Component Value Date/Time   COLORURINE YELLOW 10/23/2009 0057   APPEARANCEUR CLOUDY* 10/23/2009 0057   LABSPEC 1.025 06/29/2010 1418   PHURINE 6.0 06/29/2010 1418   GLUCOSEU NEGATIVE 06/29/2010 1418   HGBUR NEGATIVE 06/29/2010 1418   BILIRUBINUR - 09/10/2014 1130   BILIRUBINUR NEGATIVE 06/29/2010 1418   KETONESUR NEGATIVE 06/29/2010 1418   PROTEINUR - 09/10/2014 1130   PROTEINUR NEGATIVE 06/29/2010 1418   UROBILINOGEN negative 09/10/2014 1130   UROBILINOGEN 0.2 06/29/2010 1418   NITRITE - 09/10/2014 1130   NITRITE NEGATIVE 06/29/2010 1418   LEUKOCYTESUR Negative 09/10/2014 1130    STUDIES: CLINICAL DATA: History of right mastectomy. Palpable mass and tenderness in the 7 o'clock location of the left breast.  EXAM: DIGITAL DIAGNOSTIC LEFT MAMMOGRAM WITH CAD  ULTRASOUND LEFT BREAST  COMPARISON: 03/21/2013 and earlier  ACR Breast Density Category c: The breast tissue is heterogeneously dense, which may obscure small masses.  FINDINGS: No suspicious mass, distortion, or microcalcifications are identified to suggest presence of malignancy.  Mammographic images were processed with CAD.  On physical exam, I palpate no discrete mass in the lower inner quadrant of the left breast. The patient is slightly tender on physical exam.  Ultrasound is performed, showing normal appearing fibroglandular tissue throughout the lower inner quadrant of the left breast. No mass, distortion, or acoustic shadowing is demonstrated with ultrasound.  IMPRESSION: 1. No mammographic or ultrasound evidence for malignancy. 2. No mammographic or sonographic abnormality in the 7 o'clock location of the left breast.  RECOMMENDATION: Left screening mammogram is suggested in September 2015.  I have discussed the findings and recommendations with the patient. Results were also provided in writing at the conclusion of  the visit. If applicable, a reminder letter will be sent to the patient regarding the next appointment.  BI-RADS CATEGORY 1: Negative.   Electronically Signed  By: Shon Hale M.D.  On: 08/30/2013 10:53  ASSESSMENT: 64 y.o. 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 She took tamoxifen between December of 2004 and July of 2006 at which point she was switched to anastrozole, completing her 5 years of anastrozole in December of 2011  PLAN: Marya Amsler going to continue to see [name] on a yearly basis at her request although she knows that would be comfortable with her "graduating". We do have a new survivorship clinic and I think she may be a good candidate for that. Otherwise she will return to see me in one year. She knows to call for any problems that may develop before her next visit here.   Taje Littler C    10/15/2014

## 2014-10-15 NOTE — Telephone Encounter (Signed)
per pof to sch pt appt-gave pt copy of sch °

## 2014-10-16 ENCOUNTER — Other Ambulatory Visit: Payer: Self-pay | Admitting: *Deleted

## 2014-10-16 DIAGNOSIS — Z9221 Personal history of antineoplastic chemotherapy: Secondary | ICD-10-CM

## 2014-10-16 DIAGNOSIS — C50911 Malignant neoplasm of unspecified site of right female breast: Secondary | ICD-10-CM

## 2014-10-16 DIAGNOSIS — Z780 Asymptomatic menopausal state: Secondary | ICD-10-CM

## 2014-11-21 ENCOUNTER — Telehealth: Payer: Self-pay | Admitting: Oncology

## 2014-11-21 ENCOUNTER — Telehealth: Payer: Self-pay | Admitting: *Deleted

## 2014-11-21 ENCOUNTER — Other Ambulatory Visit: Payer: Self-pay | Admitting: *Deleted

## 2014-11-21 DIAGNOSIS — M8580 Other specified disorders of bone density and structure, unspecified site: Secondary | ICD-10-CM

## 2014-11-21 DIAGNOSIS — Z9221 Personal history of antineoplastic chemotherapy: Secondary | ICD-10-CM

## 2014-11-21 DIAGNOSIS — Z853 Personal history of malignant neoplasm of breast: Secondary | ICD-10-CM

## 2014-11-21 DIAGNOSIS — C50911 Malignant neoplasm of unspecified site of right female breast: Secondary | ICD-10-CM

## 2014-11-21 NOTE — Telephone Encounter (Signed)
Reviewed pt's chart.  Noted pt has history of ER/PR + breast cancer , s/p chemo and AI therapy- now completed and under yearly follow up for survivorship.  Prior bone density showed pt as osteopenic.  Order placed by this RN for bone density for evaluation and possible need for therapy.

## 2014-11-21 NOTE — Telephone Encounter (Signed)
Confirmed Bone density for May.

## 2014-11-21 NOTE — Telephone Encounter (Signed)
TC from patient regarding the need for a Bone Density Scan. Her last one was December 2013. Her PCP told her she may need another one, but needs recommendation from Dr. Jana Hakim as the scan have been set up through his office.

## 2014-11-29 ENCOUNTER — Ambulatory Visit
Admission: RE | Admit: 2014-11-29 | Discharge: 2014-11-29 | Disposition: A | Discharge status: Home / Self Care | Payer: 59 | Source: Ambulatory Visit | Attending: Oncology | Admitting: Oncology

## 2014-11-29 DIAGNOSIS — Z853 Personal history of malignant neoplasm of breast: Secondary | ICD-10-CM

## 2014-11-29 DIAGNOSIS — C50911 Malignant neoplasm of unspecified site of right female breast: Secondary | ICD-10-CM

## 2014-11-29 DIAGNOSIS — M8580 Other specified disorders of bone density and structure, unspecified site: Secondary | ICD-10-CM

## 2014-11-29 DIAGNOSIS — Z9221 Personal history of antineoplastic chemotherapy: Secondary | ICD-10-CM

## 2014-12-12 ENCOUNTER — Telehealth: Payer: Self-pay

## 2014-12-12 NOTE — Telephone Encounter (Signed)
Bone density dtd 11/29/14 [facility]d from [facility].  Reviewed by Dr. Jana Hakim.  Sent to scan.

## 2015-01-15 ENCOUNTER — Other Ambulatory Visit (HOSPITAL_COMMUNITY): Payer: Self-pay | Admitting: Physician Assistant

## 2015-01-15 DIAGNOSIS — M5412 Radiculopathy, cervical region: Secondary | ICD-10-CM

## 2015-01-15 DIAGNOSIS — M5020 Other cervical disc displacement, unspecified cervical region: Secondary | ICD-10-CM

## 2015-01-15 DIAGNOSIS — M5030 Other cervical disc degeneration, unspecified cervical region: Secondary | ICD-10-CM

## 2015-01-27 ENCOUNTER — Ambulatory Visit (HOSPITAL_COMMUNITY)
Admission: RE | Admit: 2015-01-27 | Discharge: 2015-01-27 | Disposition: A | Discharge status: Home / Self Care | Payer: Commercial Managed Care - HMO | Source: Ambulatory Visit | Attending: Physician Assistant | Admitting: Physician Assistant

## 2015-01-27 DIAGNOSIS — M479 Spondylosis, unspecified: Secondary | ICD-10-CM | POA: Insufficient documentation

## 2015-01-27 DIAGNOSIS — M542 Cervicalgia: Secondary | ICD-10-CM | POA: Diagnosis present

## 2015-01-27 DIAGNOSIS — M4802 Spinal stenosis, cervical region: Secondary | ICD-10-CM | POA: Insufficient documentation

## 2015-01-27 DIAGNOSIS — M5412 Radiculopathy, cervical region: Secondary | ICD-10-CM

## 2015-01-27 DIAGNOSIS — M5020 Other cervical disc displacement, unspecified cervical region: Secondary | ICD-10-CM

## 2015-01-27 DIAGNOSIS — M5030 Other cervical disc degeneration, unspecified cervical region: Secondary | ICD-10-CM

## 2015-02-24 ENCOUNTER — Other Ambulatory Visit: Payer: Self-pay

## 2015-02-24 DIAGNOSIS — Z1231 Encounter for screening mammogram for malignant neoplasm of breast: Secondary | ICD-10-CM

## 2015-03-26 ENCOUNTER — Ambulatory Visit (INDEPENDENT_AMBULATORY_CARE_PROVIDER_SITE_OTHER): Payer: Commercial Managed Care - HMO | Admitting: Cardiovascular Disease

## 2015-03-26 ENCOUNTER — Encounter: Payer: Self-pay | Admitting: Cardiovascular Disease

## 2015-03-26 VITALS — BP 116/62 | HR 75 | Ht 62.75 in | Wt 142.4 lb

## 2015-03-26 DIAGNOSIS — I447 Left bundle-branch block, unspecified: Secondary | ICD-10-CM | POA: Insufficient documentation

## 2015-03-26 DIAGNOSIS — R0600 Dyspnea, unspecified: Secondary | ICD-10-CM | POA: Diagnosis not present

## 2015-03-26 NOTE — Patient Instructions (Signed)
Medication Instructions:  Your physician recommends that you continue on your current medications as directed. Please refer to the Current Medication list given to you today.   Labwork: None Ordered   Testing/Procedures: Your physician has requested that you have an echocardiogram. Echocardiography is a painless test that uses sound waves to create images of your heart. It provides your doctor with information about the size and shape of your heart and how well your heart's chambers and valves are working. This procedure takes approximately one hour. There are no restrictions for this procedure.  Your physician has requested that you have a lexiscan myoview. For further information please visit [email or web address]. Please follow instruction sheet, as given.    Follow-Up: Your physician wants you to follow-up in: 6 months with Dr. Acie Fredrickson.  You will receive a reminder letter in the mail two months in advance. If you don't receive a letter, please call our office to schedule the follow-up appointment.

## 2015-03-26 NOTE — Progress Notes (Signed)
Cardiology Office Note   Date:  03/26/2015   ID:  Gar Tucker, DOB 1950-11-03, MRN [id number]  PCP:  Glo Herring., MD  Cardiologist:   Thayer Headings, MD   No chief complaint on file.  Problem List:  1. Atypical chest pain 2. Breast cancer ( right breast , 2004) s/p surgery and XRT.  3. LBBB    History of Present Illness: Kylie Tucker is a 64 y.o. female who presents for evaluation of some atypical chest pain . Has lots of fatigue. Has leg heaviness and fatigue. Has some back pain - sciatic nerve issues  The sharp chest pain would ease if she changed position or takes a deep breath  Does not get any exercise recently - mostly due to back pain    She had a normal cardiac cath in the remote past   Past Medical History  Diagnosis Date  . Cellulitis and abscess of upper arm and forearm   . Postmastectomy lymphedema rt side  . History of right mastectomy 2004    chemo/ radiation  . Migraines   . Cancer rt breast    2004/ chemo/ radiation  . Allergy   . Hyperlipidemia   . PONV (postoperative nausea and vomiting)     cannot take zofran  . GERD (gastroesophageal reflux disease)   . Sentinel node   . Anxiety     Past Surgical History  Procedure Laterality Date  . Rt mastectomy  2004    lymph nodes-7-axillary node dissection  . Nasal sinus surgery  1990/2001  . Mandible surgery  1991  . Port-a-cath removal      in and now out  . Nasal sinus surgery    . Mouth surgery    . Orif finger fracture  02/14/2012    Procedure: OPEN REDUCTION INTERNAL FIXATION (ORIF) METACARPAL (FINGER) FRACTURE;  Surgeon: Wynonia Sours, MD;  Location: [facility];  Service: Orthopedics;  Laterality: Right;  RIGHT FIFTH   . Breast lumpectomy  2004     br bx  . Orif metacarpal fracture  8/13    rt   . Repair extensor tendon Left 05/30/2013    Procedure: RELEASE TRANSPOSITION EXTENSOR POLLICUS LONGUS LEFT WRIST;  Surgeon: Wynonia Sours, MD;  Location: [facility];  Service: Orthopedics;  Laterality: Left;  . Breast lumpectomy with axillary lymph node dissection    . Carpal tunnel release    . Wrist arthroscopy Left 2014  . Shoulder arthroscopy with rotator cuff repair and subacromial decompression Left 11/22/2013    Procedure: LEFT SHOULDER ARTHROSCOPY WITH SUBACROMIAL DECOMPRESSION DISTAL CLAVICLE RESECTION REPAIR  ROTATOR CUFF AS NEEDED ;  Surgeon: Cammie Sickle., MD;  Location: [facility];  Service: Orthopedics;  Laterality: Left;  . Cataract extraction Left 07/12/2013     Current Outpatient Prescriptions  Medication Sig Dispense Refill  . ALPRAZOLAM XR 1 MG 24 hr tablet Take 1 mg by mouth at bedtime. TO HELP WITH SLEEP    . calcium carbonate (OS-CAL) 600 MG TABS Take 600 mg by mouth 2 (two) times daily with a meal.      . Cholecalciferol (VITAMIN D) 2000 UNITS CAPS Take 1 capsule by mouth daily.     Marland Kitchen eletriptan (RELPAX) 40 MG tablet Take 40 mg by mouth as needed for migraine or headache. One tablet by mouth at onset of headache. May repeat in 2 hours if headache persists or recurs.    Marland Kitchen ezetimibe (  ZETIA) 10 MG tablet Take 10 mg by mouth at bedtime.     . fluticasone (FLONASE) 50 MCG/ACT nasal spray Place 2 sprays into both nostrils daily.     . Multiple Vitamins-Minerals (MULTIVITAMINS THER. W/MINERALS) TABS Take 1 tablet by mouth daily.      . Olopatadine HCl (PATADAY) 0.2 % SOLN Place 1 drop into both eyes daily.     . pantoprazole (PROTONIX) 40 MG tablet Take 40 mg by mouth daily as needed (for GERD).     . Probiotic Product (PROBIOTIC DAILY PO) Take by mouth daily.     . promethazine (PHENERGAN) 25 MG tablet Take 25 mg by mouth every 8 (eight) hours as needed for nausea or vomiting.     . venlafaxine XR (EFFEXOR-XR) 37.5 MG 24 hr capsule Take 1 capsule (37.5 mg total) by mouth daily with breakfast. 90 capsule 3   No current facility-administered medications for this visit.    Allergies:   Acetaminophen;  Amoxicillin-pot clavulanate; Azithromycin; Cefuroxime axetil; Cephalexin; Clarithromycin; Codeine; Hydrocodone-acetaminophen; Levofloxacin; Morphine and related; Ondansetron; Propoxyphene n-acetaminophen; Rosuvastatin; Sulfonamide derivatives; Tramadol; Vioxx; and Zofran    Social History:  The patient  reports that she has never smoked. She has never used smokeless tobacco. She reports that she does not drink alcohol or use illicit drugs.   Family History:  The patient's family history includes Cancer in her maternal aunt; Diabetes in her brother; Heart disease in her father, mother, paternal grandfather, paternal grandmother, and paternal uncle; Hypertension in her mother.    ROS:  Please see the history of present illness.    Review of Systems: Constitutional:  denies fever, chills, diaphoresis, appetite change and fatigue.  HEENT: denies photophobia, eye pain, redness, hearing loss, ear pain, congestion, sore throat, rhinorrhea, sneezing, neck pain, neck stiffness and tinnitus.  Respiratory: denies SOB, DOE, cough, chest tightness, and wheezing.  Cardiovascular: denies chest pain, palpitations and leg swelling.  Gastrointestinal: denies nausea, vomiting, abdominal pain, diarrhea, constipation, blood in stool.  Genitourinary: denies dysuria, urgency, frequency, hematuria, flank pain and difficulty urinating.  Musculoskeletal: denies  myalgias, back pain, joint swelling, arthralgias and gait problem.   Skin: denies pallor, rash and wound.  Neurological: denies dizziness, seizures, syncope, weakness, light-headedness, numbness and headaches.   Hematological: denies adenopathy, easy bruising, personal or family bleeding history.  Psychiatric/ Behavioral: denies suicidal ideation, mood changes, confusion, nervousness, sleep disturbance and agitation.       All other systems are reviewed and negative.    PHYSICAL EXAM: VS:  BP 116/62 mmHg  Pulse 75  Ht 5' 2.75" (1.594 m)  Wt 64.592 kg  (142 lb 6.4 oz)  BMI 25.42 kg/m2  SpO2 97%  LMP 01/10/2003 , BMI Body mass index is 25.42 kg/(m^2). GEN: Well nourished, well developed, in no acute distress HEENT: normal Neck: no JVD, carotid bruits, or masses Cardiac: RRR; no murmurs, rubs, or gallops,no edema , + chest wall pain above left breast  Respiratory:  clear to auscultation bilaterally, normal work of breathing GI: soft, nontender, nondistended, + BS MS: no deformity or atrophy, right arm is wrapped in an ace wrap  Skin: warm and dry, no rash Neuro:  Strength and sensation are intact Psych: normal   EKG:  EKG is ordered today. The ekg ordered today demonstrates NSR at 75.  LBBB  - the LBBB is new since previous tracing    Recent Labs: 10/15/2014: ALT 16; BUN 15.1; Creatinine 0.9; HGB 14.0; Platelets 278; Potassium 4.1; Sodium 142  Lipid Panel    Component Value Date/Time   CHOL 225* 05/04/2010 0827   TRIG 232.0* 05/04/2010 0827   HDL 38.20* 05/04/2010 0827   CHOLHDL 6 05/04/2010 0827   VLDL 46.4* 05/04/2010 0827   LDLDIRECT 145.4 05/04/2010 0827      Wt Readings from Last 3 Encounters:  03/26/15 64.592 kg (142 lb 6.4 oz)  10/15/14 65.363 kg (144 lb 1.6 oz)  09/10/14 64.592 kg (142 lb 6.4 oz)      Other studies Reviewed: Additional studies/ records that were reviewed today include: . Review of the above records demonstrates:    ASSESSMENT AND PLAN:  1.  LBBB - new since 2011.   Has had adriamycin.   Will get an echo to evaluate her left ventricular function. We'll also get a [employer] study for further evaluation of possible ischemia.  2. Chest wall pain. She clearly has costochondritis. Ive recommended  that she take Motrin as needed.  3. Fatigue: I suspect this is because she does not sleep well. I doubt that this as a cardiac etiology.  I'll see her in 6 months for follow-up visit.    Current medicines are reviewed at length with the patient today.  The patient does not have concerns  regarding medicines.  The following changes have been made:  no change  Labs/ tests ordered today include:  No orders of the defined types were placed in this encounter.     Disposition:   FU with me in 6 months      Vieva Brummitt, Wonda Cheng, MD  03/26/2015 3:53 PM    [facility] [place], [place], [place]  [address] Phone: [phone]; Fax: [phone]   [facility]  [address] [place], [place]  [address] [phone]   Fax [phone]

## 2015-04-02 ENCOUNTER — Telehealth (HOSPITAL_COMMUNITY): Payer: Self-pay | Admitting: *Deleted

## 2015-04-02 NOTE — Telephone Encounter (Signed)
Patient given detailed instructions per Myocardial Perfusion Study Information Sheet for test on 04/07/15 at 1130. Patient notified to arrive 15 minutes early and that it is imperative to arrive on time for appointment to keep from having the test rescheduled.  If you need to cancel or reschedule your appointment, please call the office within 24 hours of your appointment. Failure to do so may result in a cancellation of your appointment, and a $50 no show fee. Patient verbalized understanding. Hubbard Robinson, RN

## 2015-04-03 ENCOUNTER — Encounter: Payer: Self-pay | Admitting: Cardiology

## 2015-04-04 ENCOUNTER — Other Ambulatory Visit: Payer: Self-pay

## 2015-04-04 ENCOUNTER — Ambulatory Visit
Admission: RE | Admit: 2015-04-04 | Discharge: 2015-04-04 | Disposition: A | Discharge status: Home / Self Care | Payer: Commercial Managed Care - HMO | Source: Ambulatory Visit

## 2015-04-04 DIAGNOSIS — Z1231 Encounter for screening mammogram for malignant neoplasm of breast: Secondary | ICD-10-CM

## 2015-04-07 ENCOUNTER — Other Ambulatory Visit: Payer: Self-pay

## 2015-04-07 ENCOUNTER — Ambulatory Visit (HOSPITAL_COMMUNITY): Payer: Commercial Managed Care - HMO | Attending: Cardiology

## 2015-04-07 ENCOUNTER — Ambulatory Visit (HOSPITAL_BASED_OUTPATIENT_CLINIC_OR_DEPARTMENT_OTHER): Payer: Commercial Managed Care - HMO

## 2015-04-07 ENCOUNTER — Telehealth: Payer: Self-pay | Admitting: Nurse Practitioner

## 2015-04-07 DIAGNOSIS — R0600 Dyspnea, unspecified: Secondary | ICD-10-CM | POA: Insufficient documentation

## 2015-04-07 DIAGNOSIS — I340 Nonrheumatic mitral (valve) insufficiency: Secondary | ICD-10-CM | POA: Diagnosis not present

## 2015-04-07 DIAGNOSIS — R42 Dizziness and giddiness: Secondary | ICD-10-CM | POA: Diagnosis not present

## 2015-04-07 DIAGNOSIS — R002 Palpitations: Secondary | ICD-10-CM | POA: Insufficient documentation

## 2015-04-07 DIAGNOSIS — Z853 Personal history of malignant neoplasm of breast: Secondary | ICD-10-CM | POA: Diagnosis not present

## 2015-04-07 DIAGNOSIS — I447 Left bundle-branch block, unspecified: Secondary | ICD-10-CM | POA: Insufficient documentation

## 2015-04-07 DIAGNOSIS — R5383 Other fatigue: Secondary | ICD-10-CM | POA: Diagnosis not present

## 2015-04-07 DIAGNOSIS — R079 Chest pain, unspecified: Secondary | ICD-10-CM | POA: Diagnosis not present

## 2015-04-07 DIAGNOSIS — R0609 Other forms of dyspnea: Secondary | ICD-10-CM | POA: Diagnosis not present

## 2015-04-07 LAB — MYOCARDIAL PERFUSION IMAGING
CHL CUP NUCLEAR SDS: 1
CHL CUP RESTING HR STRESS: 67 {beats}/min
LV sys vol: 41 mL
LVDIAVOL: 89 mL
Peak HR: 103 {beats}/min
RATE: 0.36
SRS: 7
SSS: 8
TID: 0.85

## 2015-04-07 MED ORDER — TECHNETIUM TC 99M SESTAMIBI GENERIC - CARDIOLITE
10.7000 | Freq: Once | INTRAVENOUS | Status: AC | PRN
  Administered 2015-04-07: 11 via INTRAVENOUS

## 2015-04-07 MED ORDER — REGADENOSON 0.4 MG/5ML IV SOLN
0.4000 mg | Freq: Once | INTRAVENOUS | Status: AC
  Administered 2015-04-07: 0.4 mg via INTRAVENOUS

## 2015-04-07 MED ORDER — TECHNETIUM TC 99M SESTAMIBI GENERIC - CARDIOLITE
32.7000 | Freq: Once | INTRAVENOUS | Status: AC | PRN
  Administered 2015-04-07: 32.7 via INTRAVENOUS

## 2015-04-07 MED ORDER — CARVEDILOL 3.125 MG PO TABS: 3.1250 mg | ORAL_TABLET | Freq: Two times a day (BID) | ORAL | Status: DC

## 2015-04-07 NOTE — Telephone Encounter (Signed)
Reviewed results of myoview with patient who verbalized understanding and thanked me for the call

## 2015-04-07 NOTE — Telephone Encounter (Signed)
-----   Message from Thayer Headings, MD sent at 04/07/2015  2:07 PM EDT ----- LV function is mildly - moderately depressed.   Possibly due to the LBBB or the hx of adriamycin therapy. Please start Coreg 3.125 bid. Please work her into my schedule in about 4 weeks .

## 2015-04-07 NOTE — Telephone Encounter (Signed)
Reviewed echo results and plan of care with patient.  Rx for Coreg sent to pharmacy and follow-up appointment scheduled for 10/27.  I advised her I will call back with results of the lexiscan myoview.  She verbalized understanding and agreement.

## 2015-04-08 ENCOUNTER — Telehealth: Payer: Self-pay | Admitting: Cardiovascular Disease

## 2015-04-08 NOTE — Telephone Encounter (Signed)
New message    pateint calling    Pt c/o medication issue:  1. Name of Medication: carvedilol 3.125 mg  2. How are you currently taking this medication (dosage and times per day)? Haven't started   3. Are you having a reaction (difficulty breathing--STAT)? No   4. What is your medication issue? Reading the sided effect of medication has some questions.

## 2015-04-08 NOTE — Telephone Encounter (Signed)
Left message for patient to call back  

## 2015-04-10 NOTE — Telephone Encounter (Signed)
Spoke with patient who has questions about Carvedilol including interaction with Ibuprofen which she takes occasionally for shoulder pain.  I advised her that she may continue to take Ibuprofen or other NSAIDs prn. I reviewed with her the long term side effects of NSAIDs and she verbalized understanding and agreement and states she does not take daily.  We also discussed the benefit of Carvedilol for depressed LV function; she has questions about the progression of her symptoms since she hasn't taken the chemotherapy since 2004.  I advised her that Dr. Acie Fredrickson can discuss that with her further at her appointment in 4 weeks.  I advised her to start the Carvedilol and to call back if she has questions or concerns.  I advised her that some fatigue is likely for the first 1-2 weeks of therapy.  She verbalized understanding and agreement with plan of care and thanked me for answering her questions.

## 2015-04-15 ENCOUNTER — Other Ambulatory Visit (HOSPITAL_COMMUNITY): Payer: Self-pay | Admitting: Orthopedic Surgery

## 2015-04-15 ENCOUNTER — Ambulatory Visit (HOSPITAL_COMMUNITY)
Admission: RE | Admit: 2015-04-15 | Discharge: 2015-04-15 | Disposition: A | Discharge status: Home / Self Care | Payer: Commercial Managed Care - HMO | Source: Ambulatory Visit | Attending: Cardiovascular Disease | Admitting: Cardiovascular Disease

## 2015-04-15 DIAGNOSIS — M79604 Pain in right leg: Secondary | ICD-10-CM | POA: Diagnosis not present

## 2015-04-15 DIAGNOSIS — E785 Hyperlipidemia, unspecified: Secondary | ICD-10-CM | POA: Insufficient documentation

## 2015-05-06 ENCOUNTER — Encounter: Payer: Self-pay | Admitting: Cardiovascular Disease

## 2015-05-09 ENCOUNTER — Ambulatory Visit (INDEPENDENT_AMBULATORY_CARE_PROVIDER_SITE_OTHER): Payer: Commercial Managed Care - HMO | Admitting: Cardiovascular Disease

## 2015-05-09 ENCOUNTER — Encounter: Payer: Self-pay | Admitting: Cardiovascular Disease

## 2015-05-09 VITALS — BP 130/88 | HR 71 | Ht 62.75 in | Wt 143.8 lb

## 2015-05-09 DIAGNOSIS — I5022 Chronic systolic (congestive) heart failure: Secondary | ICD-10-CM

## 2015-05-09 MED ORDER — OLMESARTAN MEDOXOMIL 5 MG PO TABS: 5.0000 mg | ORAL_TABLET | Freq: Every day | ORAL | Status: DC

## 2015-05-09 MED ORDER — CARVEDILOL 6.25 MG PO TABS: 6.2500 mg | ORAL_TABLET | Freq: Two times a day (BID) | ORAL | Status: DC

## 2015-05-09 NOTE — Patient Instructions (Addendum)
Medication Instructions:  INCREASE Coreg (Carvedilol) to 6.25 mg twice daily START Benicar 5 mg once daily  Labwork: None Ordered   Testing/Procedures: None Ordered   Follow-Up: Your physician recommends that you schedule a follow-up appointment in: 3 months with Dr. Acie Fredrickson    If you need a refill on your cardiac medications before your next appointment, please call your pharmacy.   Thank you for choosing [facility]! Christen Bame, RN [phone]

## 2015-05-09 NOTE — Progress Notes (Signed)
Cardiology Office Note   Date:  05/09/2015   ID:  Kylie Tucker, DOB 21-Mar-1951, MRN [id number]  PCP:  Glo Herring., MD  Cardiologist:   Thayer Headings, MD   Chief Complaint  Patient presents with  . Hypertension   Problem List:  1. Atypical chest pain 2. Breast cancer ( right breast , 2004) s/p surgery and XRT.  3. LBBB    History of Present Illness: Kylie Tucker is a 64 y.o. female who presents for evaluation of some atypical chest pain . Has lots of fatigue. Has leg heaviness and fatigue. Has some back pain - sciatic nerve issues  The sharp chest pain would ease if she changed position or takes a deep breath  Does not get any exercise recently - mostly due to back pain   She had a normal cardiac cath in the remote past   Oct. 28, 2016:  Kylie Tucker had an echo that revealed moderate LV dysfunction  Left ventricle: The cavity size was normal. Systolic function was mildly to moderately reduced. The estimated ejection fraction was in the range of 40% to 45%. Moderate diffuse hypokinesis with no identifiable regional variations. Features are consistent with a pseudonormal left ventricular filling pattern, with concomitant abnormal relaxation and increased filling pressure (grade 2 diastolic dysfunction). - Ventricular septum: Septal motion showed paradox. These changes are consistent with a left bundle branch block. - Mitral valve: There was moderate regurgitation directed centrally     Past Medical History  Diagnosis Date  . Cellulitis and abscess of upper arm and forearm   . Postmastectomy lymphedema rt side  . History of right mastectomy 2004    chemo/ radiation  . Migraines   . Cancer ([facility]) rt breast    2004/ chemo/ radiation  . Allergy   . Hyperlipidemia   . PONV (postoperative nausea and vomiting)     cannot take zofran  . GERD (gastroesophageal reflux disease)   . Sentinel node   . Anxiety     Past Surgical History    Procedure Laterality Date  . Rt mastectomy  2004    lymph nodes-7-axillary node dissection  . Nasal sinus surgery  1990/2001  . Mandible surgery  1991  . Port-a-cath removal      in and now out  . Nasal sinus surgery    . Mouth surgery    . Orif finger fracture  02/14/2012    Procedure: OPEN REDUCTION INTERNAL FIXATION (ORIF) METACARPAL (FINGER) FRACTURE;  Surgeon: Wynonia Sours, MD;  Location: [facility];  Service: Orthopedics;  Laterality: Right;  RIGHT FIFTH   . Breast lumpectomy  2004     br bx  . Orif metacarpal fracture  8/13    rt   . Repair extensor tendon Left 05/30/2013    Procedure: RELEASE TRANSPOSITION EXTENSOR POLLICUS LONGUS LEFT WRIST;  Surgeon: Wynonia Sours, MD;  Location: [facility];  Service: Orthopedics;  Laterality: Left;  . Breast lumpectomy with axillary lymph node dissection    . Carpal tunnel release    . Wrist arthroscopy Left 2014  . Shoulder arthroscopy with rotator cuff repair and subacromial decompression Left 11/22/2013    Procedure: LEFT SHOULDER ARTHROSCOPY WITH SUBACROMIAL DECOMPRESSION DISTAL CLAVICLE RESECTION REPAIR  ROTATOR CUFF AS NEEDED ;  Surgeon: Cammie Sickle., MD;  Location: [facility];  Service: Orthopedics;  Laterality: Left;  . Cataract extraction Left 07/12/2013     Current Outpatient Prescriptions  Medication Sig  Dispense Refill  . ALPRAZOLAM XR 1 MG 24 hr tablet Take 2 mg by mouth at bedtime. TO HELP WITH SLEEP    . calcium carbonate (OS-CAL) 600 MG TABS Take 600 mg by mouth 2 (two) times daily with a meal.      . carvedilol (COREG) 3.125 MG tablet Take 1 tablet (3.125 mg total) by mouth 2 (two) times daily with a meal. 60 tablet 11  . Cholecalciferol (VITAMIN D) 2000 UNITS CAPS Take 1 capsule by mouth daily.     Marland Kitchen eletriptan (RELPAX) 40 MG tablet Take 40 mg by mouth as needed for migraine or headache. One tablet by mouth at onset of headache. May repeat in 2 hours if headache persists or  recurs.    Marland Kitchen ezetimibe (ZETIA) 10 MG tablet Take 10 mg by mouth at bedtime.     . fluticasone (FLONASE) 50 MCG/ACT nasal spray Place 2 sprays into both nostrils daily.     . Multiple Vitamins-Minerals (MULTIVITAMINS THER. W/MINERALS) TABS Take 1 tablet by mouth daily.      . Olopatadine HCl (PATADAY) 0.2 % SOLN Place 1 drop into both eyes daily.     . pantoprazole (PROTONIX) 40 MG tablet Take 40 mg by mouth daily as needed (for GERD).     . Probiotic Product (PROBIOTIC DAILY PO) Take by mouth daily.     . promethazine (PHENERGAN) 25 MG tablet Take 25 mg by mouth every 8 (eight) hours as needed for nausea or vomiting.     . venlafaxine XR (EFFEXOR-XR) 37.5 MG 24 hr capsule Take 1 capsule (37.5 mg total) by mouth daily with breakfast. 90 capsule 3   No current facility-administered medications for this visit.    Allergies:   Acetaminophen; Amoxicillin-pot clavulanate; Azithromycin; Cefuroxime axetil; Cephalexin; Clarithromycin; Codeine; Hydrocodone-acetaminophen; Levofloxacin; Morphine and related; Ondansetron; Propoxyphene n-acetaminophen; Rosuvastatin; Sulfonamide derivatives; Tramadol; Vioxx; and Zofran    Social History:  The patient  reports that she has never smoked. She has never used smokeless tobacco. She reports that she does not drink alcohol or use illicit drugs.   Family History:  The patient's family history includes Cancer in her maternal aunt; Diabetes in her brother; Heart disease in her father, mother, paternal grandfather, paternal grandmother, and paternal uncle; Hypertension in her mother.    ROS:  Please see the history of present illness.    Review of Systems: Constitutional:  denies fever, chills, diaphoresis, appetite change and fatigue.  HEENT: denies photophobia, eye pain, redness, hearing loss, ear pain, congestion, sore throat, rhinorrhea, sneezing, neck pain, neck stiffness and tinnitus.  Respiratory: denies SOB, DOE, cough, chest tightness, and wheezing.    Cardiovascular: denies chest pain, palpitations and leg swelling.  Gastrointestinal: denies nausea, vomiting, abdominal pain, diarrhea, constipation, blood in stool.  Genitourinary: denies dysuria, urgency, frequency, hematuria, flank pain and difficulty urinating.  Musculoskeletal: denies  myalgias, back pain, joint swelling, arthralgias and gait problem.   Skin: denies pallor, rash and wound.  Neurological: denies dizziness, seizures, syncope, weakness, light-headedness, numbness and headaches.   Hematological: denies adenopathy, easy bruising, personal or family bleeding history.  Psychiatric/ Behavioral: denies suicidal ideation, mood changes, confusion, nervousness, sleep disturbance and agitation.       All other systems are reviewed and negative.    PHYSICAL EXAM: VS:  BP 130/88 mmHg  Pulse 71  Ht 5' 2.75" (1.594 m)  Wt 143 lb 12.8 oz (65.227 kg)  BMI 25.67 kg/m2  SpO2 98%  LMP 01/10/2003 , BMI Body mass index is  25.67 kg/(m^2). GEN: Well nourished, well developed, in no acute distress HEENT: normal Neck: no JVD, carotid bruits, or masses Cardiac: RRR; no murmurs,  Reverse splitting of the second heart sound   Respiratory:  clear to auscultation bilaterally, normal work of breathing GI: soft, nontender, nondistended, + BS MS: no deformity or atrophy, right arm is wrapped in an ace wrap  Skin: warm and dry, no rash Neuro:  Strength and sensation are intact Psych: normal   EKG:  EKG is ordered today. The ekg ordered today demonstrates NSR at 75.  LBBB  - the LBBB is new since previous tracing    Recent Labs: 10/15/2014: ALT 16; BUN 15.1; Creatinine 0.9; HGB 14.0; Platelets 278; Potassium 4.1; Sodium 142    Lipid Panel    Component Value Date/Time   CHOL 225* 05/04/2010 0827   TRIG 232.0* 05/04/2010 0827   HDL 38.20* 05/04/2010 0827   CHOLHDL 6 05/04/2010 0827   VLDL 46.4* 05/04/2010 0827   LDLDIRECT 145.4 05/04/2010 0827      Wt Readings from Last 3  Encounters:  05/09/15 143 lb 12.8 oz (65.227 kg)  04/07/15 142 lb (64.411 kg)  03/26/15 142 lb 6.4 oz (64.592 kg)      Other studies Reviewed: Additional studies/ records that were reviewed today include: . Review of the above records demonstrates:    ASSESSMENT AND PLAN:  1.  LBBB - new since 2011.   Has had adriamycin.  Echocardiogram showed very mild left ventricular dysfunction. The Myoview study showed no evidence of ischemia and showed normal left ventricular function.   2. Chest wall pain. She clearly has costochondritis. Ive recommended  that she take Motrin as needed.  3. Fatigue: I suspect this is because she does not sleep well. I doubt that this as a cardiac etiology.  4. Chronic systolic congestive heart failure: She has mildly depressed left ventricle systolic motion. We'll increase the carvedilol to 6.25 mg twice a day. We will start her on Benicar 5 mg a day.  I'll see her back in the office in 3 months for follow-up visit.   I'll see her in 6 months for follow-up visit.    Current medicines are reviewed at length with the patient today.  The patient does not have concerns regarding medicines.  The following changes have been made:  no change  Labs/ tests ordered today include:  No orders of the defined types were placed in this encounter.     Disposition:   FU with me in 3 months      Nahser, Wonda Cheng, MD  05/09/2015 2:04 PM    [facility] [place], [place], [place]  [address] Phone: [phone]; Fax: [phone]   [facility]  [address] [place],   [address] [phone]   Fax [phone]

## 2015-05-12 ENCOUNTER — Telehealth: Payer: Self-pay | Admitting: Cardiovascular Disease

## 2015-05-12 NOTE — Telephone Encounter (Signed)
Patient complaining of weakness, dull headache, tired all the time, and decreased BP. Patient's BP 107/67, HR 70 and BP 99/56, HR 72. Patient recently started Benicar 5 mg and Coreg increased to 6.25 mg by mouth BID. Patient stated her symptoms started after medication changes. Encouraged patient to increase her fluids for now and have a salty snack. Will forward to Dr. Acie Fredrickson for further advisement.

## 2015-05-12 NOTE — Telephone Encounter (Signed)
Called and left message for patient to call back.

## 2015-05-12 NOTE — Telephone Encounter (Signed)
° °  Pt c/o BP issue: STAT if pt c/o blurred vision, one-sided weakness or slurred speech  1. What are your last 5 BP readings?   107/63   99-56  2. Are you having any other symptoms (ex. Dizziness, headache, blurred vision, passed out)? headache, blurred vision  3. What is your BP issue? LOW BP

## 2015-05-12 NOTE — Telephone Encounter (Signed)
Hold benicar for now. Have her continue to measure her BP. See if she can be worked into an appt. In about 4-6 weeks ( instead of 3 months )

## 2015-05-13 NOTE — Telephone Encounter (Signed)
F/u    Pt returning Pam's call.

## 2015-05-13 NOTE — Telephone Encounter (Signed)
Spoke with patient and advised her of sooner follow-up needed.  She is scheduled to see Dr. Acie Fredrickson on 12/2 and is aware to hold Benicar, monitor BP and to call with questions or concerns prior to 12/2 follow-up.  She thanked me for the call.

## 2015-05-13 NOTE — Telephone Encounter (Signed)
Agree with note from Michelle Swinyer, RN  

## 2015-05-13 NOTE — Telephone Encounter (Signed)
Left message to call back  

## 2015-05-19 ENCOUNTER — Telehealth: Payer: Self-pay | Admitting: Cardiovascular Disease

## 2015-05-19 NOTE — Telephone Encounter (Signed)
Called patient. She stated that she felt like she had been having allergy for the last couple of days and wants to see what OTC med we would recommend for her.  Has CHF.  I recommended mucinex (without the decongestant) or Coricidin (without the decongestant).  I recommended that she consult the pharmacist to help her with the selection.

## 2015-05-19 NOTE — Telephone Encounter (Signed)
New Message  Pt c/o medication issue: 1. Name of Medication: cough medication ([place])   4. What is your medication issue? Pt called states that she is experiencing head allergies. Req a call back to determine which med allergy medications will work well with her heart condition. Please call

## 2015-06-13 ENCOUNTER — Encounter: Payer: Self-pay | Admitting: Cardiovascular Disease

## 2015-06-13 ENCOUNTER — Ambulatory Visit (INDEPENDENT_AMBULATORY_CARE_PROVIDER_SITE_OTHER): Payer: Commercial Managed Care - HMO | Admitting: Cardiovascular Disease

## 2015-06-13 VITALS — BP 132/72 | HR 70 | Ht 63.0 in | Wt 143.0 lb

## 2015-06-13 DIAGNOSIS — H539 Unspecified visual disturbance: Secondary | ICD-10-CM | POA: Insufficient documentation

## 2015-06-13 DIAGNOSIS — I5022 Chronic systolic (congestive) heart failure: Secondary | ICD-10-CM | POA: Diagnosis not present

## 2015-06-13 DIAGNOSIS — R51 Headache: Secondary | ICD-10-CM

## 2015-06-13 DIAGNOSIS — F329 Major depressive disorder, single episode, unspecified: Secondary | ICD-10-CM | POA: Insufficient documentation

## 2015-06-13 DIAGNOSIS — F419 Anxiety disorder, unspecified: Secondary | ICD-10-CM | POA: Insufficient documentation

## 2015-06-13 DIAGNOSIS — F32A Depression, unspecified: Secondary | ICD-10-CM | POA: Insufficient documentation

## 2015-06-13 DIAGNOSIS — R519 Headache, unspecified: Secondary | ICD-10-CM | POA: Insufficient documentation

## 2015-06-13 DIAGNOSIS — H25019 Cortical age-related cataract, unspecified eye: Secondary | ICD-10-CM | POA: Insufficient documentation

## 2015-06-13 DIAGNOSIS — J329 Chronic sinusitis, unspecified: Secondary | ICD-10-CM | POA: Insufficient documentation

## 2015-06-13 DIAGNOSIS — Z853 Personal history of malignant neoplasm of breast: Secondary | ICD-10-CM | POA: Insufficient documentation

## 2015-06-13 DIAGNOSIS — Z7952 Long term (current) use of systemic steroids: Secondary | ICD-10-CM | POA: Insufficient documentation

## 2015-06-13 DIAGNOSIS — G43109 Migraine with aura, not intractable, without status migrainosus: Secondary | ICD-10-CM | POA: Insufficient documentation

## 2015-06-13 NOTE — Patient Instructions (Signed)
Medication Instructions:  Your physician recommends that you continue on your current medications as directed. Please refer to the Current Medication list given to you today.   Labwork: None Ordered   Testing/Procedures: None Ordered   Follow-Up: Your physician recommends that you schedule a follow-up appointment in: 3 months with Dr. Nahser   If you need a refill on your cardiac medications before your next appointment, please call your pharmacy.   Thank you for choosing [facility]! Lynda Capistran, RN [phone]    

## 2015-06-13 NOTE — Progress Notes (Signed)
Cardiology Office Note   Date:  06/13/2015   ID:  Kylie Tucker, DOB 06-Jan-1951, MRN [id number]  PCP:  Glo Herring., MD  Cardiologist:   Thayer Headings, MD   No chief complaint on file.  Problem List:  1. Atypical chest pain 2. Breast cancer ( right breast , 2004) s/p surgery and XRT.  3. LBBB    History of Present Illness: Kylie Tucker is a 64 y.o. female who presents for evaluation of some atypical chest pain . Has lots of fatigue. Has leg heaviness and fatigue. Has some back pain - sciatic nerve issues  The sharp chest pain would ease if she changed position or takes a deep breath  Does not get any exercise recently - mostly due to back pain   She had a normal cardiac cath in the remote past   Oct. 28, 2016:  Kylie Tucker had an echo that revealed moderate LV dysfunction  Left ventricle: The cavity size was normal. Systolic function was mildly to moderately reduced. The estimated ejection fraction was in the range of 40% to 45%. Moderate diffuse hypokinesis with no identifiable regional variations. Features are consistent with a pseudonormal left ventricular filling pattern, with concomitant abnormal relaxation and increased filling pressure (grade 2 diastolic dysfunction). - Ventricular septum: Septal motion showed paradox. These changes are consistent with a left bundle branch block. - Mitral valve: There was moderate regurgitation directed centrally   Dec. 2, 2016:  Seen for follow up Tolerating thecoreg.   Did not tolerate the Benicar.  Avoiding salt. Still is very fatigued - especially with walking or any exertion .  Past Medical History  Diagnosis Date  . Cellulitis and abscess of upper arm and forearm   . Postmastectomy lymphedema rt side  . History of right mastectomy 2004    chemo/ radiation  . Migraines   . Cancer ([place]) rt breast    2004/ chemo/ radiation  . Allergy   . Hyperlipidemia   . PONV (postoperative nausea  and vomiting)     cannot take zofran  . GERD (gastroesophageal reflux disease)   . Sentinel node   . Anxiety     Past Surgical History  Procedure Laterality Date  . Rt mastectomy  2004    lymph nodes-7-axillary node dissection  . Nasal sinus surgery  1990/2001  . Mandible surgery  1991  . Port-a-cath removal      in and now out  . Nasal sinus surgery    . Mouth surgery    . Orif finger fracture  02/14/2012    Procedure: OPEN REDUCTION INTERNAL FIXATION (ORIF) METACARPAL (FINGER) FRACTURE;  Surgeon: Wynonia Sours, MD;  Location: [facility];  Service: Orthopedics;  Laterality: Right;  RIGHT FIFTH   . Breast lumpectomy  2004     br bx  . Orif metacarpal fracture  8/13    rt   . Repair extensor tendon Left 05/30/2013    Procedure: RELEASE TRANSPOSITION EXTENSOR POLLICUS LONGUS LEFT WRIST;  Surgeon: Wynonia Sours, MD;  Location: [facility];  Service: Orthopedics;  Laterality: Left;  . Breast lumpectomy with axillary lymph node dissection    . Carpal tunnel release    . Wrist arthroscopy Left 2014  . Shoulder arthroscopy with rotator cuff repair and subacromial decompression Left 11/22/2013    Procedure: LEFT SHOULDER ARTHROSCOPY WITH SUBACROMIAL DECOMPRESSION DISTAL CLAVICLE RESECTION REPAIR  ROTATOR CUFF AS NEEDED ;  Surgeon: Cammie Sickle., MD;  Location: [facility];  Service: Orthopedics;  Laterality: Left;  . Cataract extraction Left 07/12/2013     Current Outpatient Prescriptions  Medication Sig Dispense Refill  . ALPRAZOLAM XR 1 MG 24 hr tablet Take 2 mg by mouth at bedtime. TO HELP WITH SLEEP    . calcium carbonate (OS-CAL) 600 MG TABS Take 600 mg by mouth 2 (two) times daily with a meal.      . carvedilol (COREG) 6.25 MG tablet Take 1 tablet (6.25 mg total) by mouth 2 (two) times daily with a meal. 60 tablet 11  . Cholecalciferol (VITAMIN D) 2000 UNITS CAPS Take 1 capsule by mouth daily.     Marland Kitchen eletriptan (RELPAX) 40 MG tablet Take  40 mg by mouth as needed for migraine or headache. One tablet by mouth at onset of headache. May repeat in 2 hours if headache persists or recurs.    Marland Kitchen ezetimibe (ZETIA) 10 MG tablet Take 10 mg by mouth at bedtime.     . fluticasone (FLONASE) 50 MCG/ACT nasal spray Place 2 sprays into both nostrils daily.     . Multiple Vitamins-Minerals (MULTIVITAMINS THER. W/MINERALS) TABS Take 1 tablet by mouth daily.      . Olopatadine HCl (PATADAY) 0.2 % SOLN Place 1 drop into both eyes daily.     . pantoprazole (PROTONIX) 40 MG tablet Take 40 mg by mouth daily as needed (for GERD).     . Probiotic Product (PROBIOTIC DAILY PO) Take by mouth daily.     . promethazine (PHENERGAN) 25 MG tablet Take 25 mg by mouth every 8 (eight) hours as needed for nausea or vomiting.     . venlafaxine XR (EFFEXOR-XR) 37.5 MG 24 hr capsule Take 1 capsule (37.5 mg total) by mouth daily with breakfast. 90 capsule 3   No current facility-administered medications for this visit.    Allergies:   Acetaminophen; Amoxicillin-pot clavulanate; Azithromycin; Cefuroxime axetil; Cephalexin; Hydrocodone-acetaminophen; Hydrocodone-acetaminophen; Levofloxacin; Morphine and related; Ondansetron; Propoxyphene n-acetaminophen; Rosuvastatin; Sulfonamide derivatives; Tramadol; Vioxx; Zofran; Clarithromycin; Codeine; and Sulfamethoxazole    Social History:  The patient  reports that she has never smoked. She has never used smokeless tobacco. She reports that she does not drink alcohol or use illicit drugs.   Family History:  The patient's family history includes Cancer in her maternal aunt; Diabetes in her brother; Heart disease in her father, mother, paternal grandfather, paternal grandmother, and paternal uncle; Hypertension in her mother.    ROS:  Please see the history of present illness.    Review of Systems: Constitutional:  denies fever, chills, diaphoresis, appetite change and fatigue.  HEENT: denies photophobia, eye pain, redness,  hearing loss, ear pain, congestion, sore throat, rhinorrhea, sneezing, neck pain, neck stiffness and tinnitus.  Respiratory: denies SOB, DOE, cough, chest tightness, and wheezing.  Cardiovascular: denies chest pain, palpitations and leg swelling.  Gastrointestinal: denies nausea, vomiting, abdominal pain, diarrhea, constipation, blood in stool.  Genitourinary: denies dysuria, urgency, frequency, hematuria, flank pain and difficulty urinating.  Musculoskeletal: denies  myalgias, back pain, joint swelling, arthralgias and gait problem.   Skin: denies pallor, rash and wound.  Neurological: denies dizziness, seizures, syncope, weakness, light-headedness, numbness and headaches.   Hematological: denies adenopathy, easy bruising, personal or family bleeding history.  Psychiatric/ Behavioral: denies suicidal ideation, mood changes, confusion, nervousness, sleep disturbance and agitation.       All other systems are reviewed and negative.    PHYSICAL EXAM: VS:  BP 132/72 mmHg  Pulse 70  Ht 5\' 3"  (  1.6 m)  Wt 143 lb (64.864 kg)  BMI 25.34 kg/m2  SpO2 98%  LMP 01/10/2003 , BMI Body mass index is 25.34 kg/(m^2). GEN: Well nourished, well developed, in no acute distress HEENT: normal Neck: no JVD, carotid bruits, or masses Cardiac: RRR; no murmurs,  Reverse splitting of the second heart sound   Respiratory:  clear to auscultation bilaterally, normal work of breathing GI: soft, nontender, nondistended, + BS MS: no deformity or atrophy, right arm is wrapped in an ace wrap  Skin: warm and dry, no rash Neuro:  Strength and sensation are intact Psych: normal   EKG:  EKG is ordered today. The ekg ordered today demonstrates NSR at 75.  LBBB  - the LBBB is new since previous tracing    Recent Labs: 10/15/2014: ALT 16; BUN 15.1; Creatinine 0.9; HGB 14.0; Platelets 278; Potassium 4.1; Sodium 142    Lipid Panel    Component Value Date/Time   CHOL 225* 05/04/2010 0827   TRIG 232.0* 05/04/2010  0827   HDL 38.20* 05/04/2010 0827   CHOLHDL 6 05/04/2010 0827   VLDL 46.4* 05/04/2010 0827   LDLDIRECT 145.4 05/04/2010 0827      Wt Readings from Last 3 Encounters:  06/13/15 143 lb (64.864 kg)  05/09/15 143 lb 12.8 oz (65.227 kg)  04/07/15 142 lb (64.411 kg)      Other studies Reviewed: Additional studies/ records that were reviewed today include: . Review of the above records demonstrates:    ASSESSMENT AND PLAN:  1.  LBBB - new since 2011.   Has had adriamycin.  Echocardiogram showed very mild left ventricular dysfunction. The Myoview study showed no evidence of ischemia and showed normal left ventricular function.   2. Chest wall pain. She clearly has costochondritis. Ive recommended  that she take Motrin as needed.  3. Fatigue: I suspect this is because she does not sleep well. I doubt that this as a cardiac etiology. She still has lots of fatigue.   We discussed the possibility of holding her Coreg for several weeks to see if the fatigue gets better.     4. Chronic systolic congestive heart failure: She has mildly depressed left ventricle systolic motion.  She is tolerating the Coreg well.  Did not tolerate the Benicar.  Continue for now  Will see her in 3 months .    Current medicines are reviewed at length with the patient today.  The patient does not have concerns regarding medicines.  The following changes have been made:  no change  Labs/ tests ordered today include:  No orders of the defined types were placed in this encounter.     Disposition:   FU with me in 3 months      Kylie Tucker, Wonda Cheng, MD  06/13/2015 11:16 AM    [facility] [place], [place], [place]  [address] Phone: [phone]; Fax: [phone]   [facility]  [address] [place], [place]  [address] [phone]   Fax [phone]

## 2015-06-19 ENCOUNTER — Telehealth: Payer: Self-pay

## 2015-06-19 NOTE — Telephone Encounter (Signed)
Sees Dr Ace Gins, she wants pt to take sermorelin acetate, compound similar to growth hormone stimulating factor, it is a compound of bioidentical 29 amino acids that stimulate the pituitary. Pt wants doctors opinion before she will take it. Pt will have Dr Ace Gins fax information about this to Dr Jana Hakim.

## 2015-07-13 DIAGNOSIS — Z8619 Personal history of other infectious and parasitic diseases: Secondary | ICD-10-CM

## 2015-07-13 HISTORY — DX: Personal history of other infectious and parasitic diseases: Z86.19

## 2015-08-05 ENCOUNTER — Ambulatory Visit: Payer: Commercial Managed Care - HMO | Admitting: Cardiovascular Disease

## 2015-09-12 ENCOUNTER — Ambulatory Visit (INDEPENDENT_AMBULATORY_CARE_PROVIDER_SITE_OTHER): Payer: Commercial Managed Care - HMO | Admitting: Cardiovascular Disease

## 2015-09-12 ENCOUNTER — Encounter: Payer: Self-pay | Admitting: Cardiovascular Disease

## 2015-09-12 VITALS — BP 130/80 | HR 64 | Ht 63.0 in | Wt 139.0 lb

## 2015-09-12 DIAGNOSIS — R0600 Dyspnea, unspecified: Secondary | ICD-10-CM | POA: Diagnosis not present

## 2015-09-12 DIAGNOSIS — I5022 Chronic systolic (congestive) heart failure: Secondary | ICD-10-CM

## 2015-09-12 DIAGNOSIS — I447 Left bundle-branch block, unspecified: Secondary | ICD-10-CM

## 2015-09-12 MED ORDER — CARVEDILOL 3.125 MG PO TABS: 3.1250 mg | ORAL_TABLET | Freq: Two times a day (BID) | ORAL | Status: DC

## 2015-09-12 NOTE — Progress Notes (Signed)
Cardiology Office Note   Date:  09/12/2015   ID:  [name], DOB 22-Aug-1950, MRN [id number]  PCP:  Glo Herring., MD  Cardiologist:   Thayer Headings, MD   No chief complaint on file.  Problem List:  1. Atypical chest pain 2. Breast cancer ( right breast , 2004) s/p surgery and XRT.  3. LBBB  4. Chronic combined systolic and diastolic CHF    History of Present Illness: Kylie Tucker is a 65 y.o. female who presents for evaluation of some atypical chest pain . Has lots of fatigue. Has leg heaviness and fatigue. Has some back pain - sciatic nerve issues  The sharp chest pain would ease if she changed position or takes a deep breath  Does not get any exercise recently - mostly due to back pain   She had a normal cardiac cath in the remote past   Oct. 28, 2016:  Kyiah had an echo that revealed moderate LV dysfunction  Left ventricle: The cavity size was normal. Systolic function was mildly to moderately reduced. The estimated ejection fraction was in the range of 40% to 45%. Moderate diffuse hypokinesis with no identifiable regional variations. Features are consistent with a pseudonormal left ventricular filling pattern, with concomitant abnormal relaxation and increased filling pressure (grade 2 diastolic dysfunction). - Ventricular septum: Septal motion showed paradox. These changes are consistent with a left bundle branch block. - Mitral valve: There was moderate regurgitation directed centrally   Dec. 2, 2016:  Seen for follow up Tolerating thecoreg.   Did not tolerate the Benicar.  Avoiding salt. Still is very fatigued - especially with walking or any exertion.  September 12, 2015:  Doing well.    Is taking Coreg 6. 25 mg only once a day .  Does not tolerate the higher dose.   Past Medical History  Diagnosis Date  . Cellulitis and abscess of upper arm and forearm   . Postmastectomy lymphedema rt side  . History of right  mastectomy 2004    chemo/ radiation  . Migraines   . Cancer ([facility]) rt breast    2004/ chemo/ radiation  . Allergy   . Hyperlipidemia   . PONV (postoperative nausea and vomiting)     cannot take zofran  . GERD (gastroesophageal reflux disease)   . Sentinel node   . Anxiety     Past Surgical History  Procedure Laterality Date  . Rt mastectomy  2004    lymph nodes-7-axillary node dissection  . Nasal sinus surgery  1990/2001  . Mandible surgery  1991  . Port-a-cath removal      in and now out  . Nasal sinus surgery    . Mouth surgery    . Orif finger fracture  02/14/2012    Procedure: OPEN REDUCTION INTERNAL FIXATION (ORIF) METACARPAL (FINGER) FRACTURE;  Surgeon: Wynonia Sours, MD;  Location: [facility];  Service: Orthopedics;  Laterality: Right;  RIGHT FIFTH   . Breast lumpectomy  2004     br bx  . Orif metacarpal fracture  8/13    rt   . Repair extensor tendon Left 05/30/2013    Procedure: RELEASE TRANSPOSITION EXTENSOR POLLICUS LONGUS LEFT WRIST;  Surgeon: Wynonia Sours, MD;  Location: [facility];  Service: Orthopedics;  Laterality: Left;  . Breast lumpectomy with axillary lymph node dissection    . Carpal tunnel release    . Wrist arthroscopy Left 2014  . Shoulder arthroscopy with rotator cuff  repair and subacromial decompression Left 11/22/2013    Procedure: LEFT SHOULDER ARTHROSCOPY WITH SUBACROMIAL DECOMPRESSION DISTAL CLAVICLE RESECTION REPAIR  ROTATOR CUFF AS NEEDED ;  Surgeon: Cammie Sickle., MD;  Location: [facility];  Service: Orthopedics;  Laterality: Left;  . Cataract extraction Left 07/12/2013     Current Outpatient Prescriptions  Medication Sig Dispense Refill  . ALPRAZOLAM XR 1 MG 24 hr tablet Take 2 mg by mouth at bedtime. TO HELP WITH SLEEP    . calcium carbonate (OS-CAL) 600 MG TABS Take 600 mg by mouth 2 (two) times daily with a meal.      . carvedilol (COREG) 6.25 MG tablet Take 1 tablet (6.25 mg total) by mouth  2 (two) times daily with a meal. 60 tablet 11  . Cholecalciferol (VITAMIN D) 2000 UNITS CAPS Take 1 capsule by mouth daily.     Marland Kitchen eletriptan (RELPAX) 40 MG tablet Take 40 mg by mouth as needed for migraine or headache. One tablet by mouth at onset of headache. May repeat in 2 hours if headache persists or recurs.    Marland Kitchen ezetimibe (ZETIA) 10 MG tablet Take 10 mg by mouth at bedtime.     . fluticasone (FLONASE) 50 MCG/ACT nasal spray Place 2 sprays into both nostrils daily.     . Multiple Vitamins-Minerals (MULTIVITAMINS THER. W/MINERALS) TABS Take 1 tablet by mouth daily.      . Olopatadine HCl (PATADAY) 0.2 % SOLN Place 1 drop into both eyes daily.     . pantoprazole (PROTONIX) 40 MG tablet Take 40 mg by mouth daily as needed (for GERD).     . Probiotic Product (PROBIOTIC DAILY PO) Take by mouth daily.     . promethazine (PHENERGAN) 25 MG tablet Take 25 mg by mouth every 8 (eight) hours as needed for nausea or vomiting.     . venlafaxine XR (EFFEXOR-XR) 37.5 MG 24 hr capsule Take 1 capsule (37.5 mg total) by mouth daily with breakfast. 90 capsule 3   No current facility-administered medications for this visit.    Allergies:   Acetaminophen; Amoxicillin-pot clavulanate; Azithromycin; Cefuroxime axetil; Cephalexin; Hydrocodone-acetaminophen; Hydrocodone-acetaminophen; Levofloxacin; Morphine and related; Ondansetron; Propoxyphene n-acetaminophen; Rosuvastatin; Sulfonamide derivatives; Tramadol; Vioxx; Zofran; Clarithromycin; Codeine; and Sulfamethoxazole    Social History:  The patient  reports that she has never smoked. She has never used smokeless tobacco. She reports that she does not drink alcohol or use illicit drugs.   Family History:  The patient's family history includes Cancer in her maternal aunt; Diabetes in her brother; Heart disease in her father, mother, paternal grandfather, paternal grandmother, and paternal uncle; Hypertension in her mother.    ROS:  Please see the history of  present illness.    Review of Systems: Constitutional:  denies fever, chills, diaphoresis, appetite change and fatigue.  HEENT: denies photophobia, eye pain, redness, hearing loss, ear pain, congestion, sore throat, rhinorrhea, sneezing, neck pain, neck stiffness and tinnitus.  Respiratory: denies SOB, DOE, cough, chest tightness, and wheezing.  Cardiovascular: denies chest pain, palpitations and leg swelling.  Gastrointestinal: denies nausea, vomiting, abdominal pain, diarrhea, constipation, blood in stool.  Genitourinary: denies dysuria, urgency, frequency, hematuria, flank pain and difficulty urinating.  Musculoskeletal: denies  myalgias, back pain, joint swelling, arthralgias and gait problem.   Skin: denies pallor, rash and wound.  Neurological: denies dizziness, seizures, syncope, weakness, light-headedness, numbness and headaches.   Hematological: denies adenopathy, easy bruising, personal or family bleeding history.  Psychiatric/ Behavioral: denies suicidal ideation, mood changes,  confusion, nervousness, sleep disturbance and agitation.       All other systems are reviewed and negative.    PHYSICAL EXAM: VS:  BP 130/80 mmHg  Pulse 64  Ht 5\' 3"  (1.6 m)  Wt 139 lb (63.05 kg)  BMI 24.63 kg/m2  LMP 01/10/2003 , BMI Body mass index is 24.63 kg/(m^2). GEN: Well nourished, well developed, in no acute distress HEENT: normal Neck: no JVD, carotid bruits, or masses Cardiac: RRR; no murmurs,  Reverse splitting of the second heart sound   Respiratory:  clear to auscultation bilaterally, normal work of breathing GI: soft, nontender, nondistended, + BS MS: no deformity or atrophy, right arm is wrapped in an ace wrap  Skin: warm and dry, no rash Neuro:  Strength and sensation are intact Psych: normal   EKG:  EKG is ordered today. The ekg ordered today demonstrates NSR at 75.  LBBB  - the LBBB is new since previous tracing    Recent Labs: 10/15/2014: ALT 16; BUN 15.1; Creatinine  0.9; HGB 14.0; Platelets 278; Potassium 4.1; Sodium 142    Lipid Panel    Component Value Date/Time   CHOL 225* 05/04/2010 0827   TRIG 232.0* 05/04/2010 0827   HDL 38.20* 05/04/2010 0827   CHOLHDL 6 05/04/2010 0827   VLDL 46.4* 05/04/2010 0827   LDLDIRECT 145.4 05/04/2010 0827      Wt Readings from Last 3 Encounters:  09/12/15 139 lb (63.05 kg)  06/13/15 143 lb (64.864 kg)  05/09/15 143 lb 12.8 oz (65.227 kg)      Other studies Reviewed: Additional studies/ records that were reviewed today include: . Review of the above records demonstrates:    ASSESSMENT AND PLAN:  1.  LBBB - new since 2011.   Has had adriamycin.  Echocardiogram showed very mild left ventricular dysfunction. The Myoview study showed no evidence of ischemia and showed normal left ventricular function.   2. Chest wall pain. She clearly has costochondritis. Ive recommended  that she take Motrin as needed.  3. Fatigue: I suspect this is because she does not sleep well. I doubt that this as a cardiac etiology. She still has lots of fatigue.   We discussed the possibility of holding her Coreg for several weeks to see if the fatigue gets better.     4. Chronic combined systolic and diastolic  congestive heart failure: She has mildly depressed left ventricle systolic motion.  She is tolerating the Coreg but  Only takes 6. 25 once a day l.  Did not tolerate the Benicar.  Continue for now.   Will change the Coreg to 3. 125 BID   Will see her in 6 months .    Current medicines are reviewed at length with the patient today.  The patient does not have concerns regarding medicines.  The following changes have been made:  no change  Labs/ tests ordered today include:  No orders of the defined types were placed in this encounter.     Disposition:   FU with me in 6 months      Nahser, Wonda Cheng, MD  09/12/2015 9:33 AM    [facility] [place], [place], [place]  [address] Phone: [phone]; Fax: [phone]   [facility]  [address] [place],   [address] [phone]   Fax [phone]

## 2015-09-12 NOTE — Patient Instructions (Signed)
Medication Instructions:  Your physician has recommended you make the following change in your medication:  1. Change Coreg ( 3.125 mg ) twice a day    Labwork: -None  Testing/Procedures: -None  Follow-Up: Your physician wants you to follow-up in: 6 month with Dr. Acie Fredrickson.  You will receive a reminder letter in the mail two months in advance. If you don't receive a letter, please call our office to schedule the follow-up appointment.   Any Other Special Instructions Will Be Listed Below (If Applicable).     If you need a refill on your cardiac medications before your next appointment, please call your pharmacy.

## 2015-09-23 ENCOUNTER — Ambulatory Visit (INDEPENDENT_AMBULATORY_CARE_PROVIDER_SITE_OTHER): Payer: Commercial Managed Care - HMO | Admitting: Cardiology

## 2015-09-23 ENCOUNTER — Other Ambulatory Visit: Payer: Self-pay | Admitting: Cardiology

## 2015-09-23 ENCOUNTER — Telehealth: Payer: Self-pay | Admitting: Cardiovascular Disease

## 2015-09-23 ENCOUNTER — Encounter: Payer: Self-pay | Admitting: Cardiology

## 2015-09-23 ENCOUNTER — Ambulatory Visit (INDEPENDENT_AMBULATORY_CARE_PROVIDER_SITE_OTHER): Payer: Commercial Managed Care - HMO

## 2015-09-23 VITALS — BP 132/70 | HR 67 | Ht 63.0 in | Wt 135.0 lb

## 2015-09-23 DIAGNOSIS — R002 Palpitations: Secondary | ICD-10-CM

## 2015-09-23 DIAGNOSIS — I5022 Chronic systolic (congestive) heart failure: Secondary | ICD-10-CM

## 2015-09-23 DIAGNOSIS — R0602 Shortness of breath: Secondary | ICD-10-CM | POA: Diagnosis not present

## 2015-09-23 DIAGNOSIS — R079 Chest pain, unspecified: Secondary | ICD-10-CM | POA: Diagnosis not present

## 2015-09-23 DIAGNOSIS — R071 Chest pain on breathing: Secondary | ICD-10-CM

## 2015-09-23 DIAGNOSIS — R0600 Dyspnea, unspecified: Secondary | ICD-10-CM

## 2015-09-23 DIAGNOSIS — I447 Left bundle-branch block, unspecified: Secondary | ICD-10-CM

## 2015-09-23 LAB — BRAIN NATRIURETIC PEPTIDE: Brain Natriuretic Peptide: 60.4 pg/mL (ref <100)

## 2015-09-23 LAB — TROPONIN I: Troponin I: <0.01 ng/mL (ref <=0.05)

## 2015-09-23 LAB — D-DIMER, QUANTITATIVE: D-Dimer, Quant: 0.27 ug/mL-FEU (ref 0.00–0.48)

## 2015-09-23 NOTE — Progress Notes (Signed)
Cardiology Office Note   Date:  09/23/2015   ID:  Kylie Tucker, DOB May 24, 1951, MRN [id number]  PCP:  Glo Herring., MD    Chief Complaint  Patient presents with  . Appointment    has stopped Effexor due to side effects and fatigue. Has not ate today in case for lab work.       History of Present Illness: Kylie Tucker is a 65 y.o. female who presents for palpitations and chest pain. She was recently seen by Dr. Acie Fredrickson for chest pain that he felt was chest wall due to costochondritis.  She was diagnosed with a LBBB.  She also was complaining of fatigue which he felt was due to poor sleep and not cardiac in etiology.  She has a history chronic combined systolic/diastolic CHF and is on BB.  She has a history of adriamycin induced DCM with EF on echo last fall 40-45% with diffuse HK.  She also has moderate MR.   She has had a normal cath in the past.  She says that last night when she went to bed she laid on her back and noticed her her heart beating abnormally.  It felt like it was beating hard.  She rolled over to her left side and it continued.  For 2 hours she kept rolling over to try to get the feeling to go away.  She feel into a light sleep and she felt like someone squeezed her chest with pressure and could not breathe.  This lasted at least 3 minutes.  She rolled over on her back and woke up and it went away.  She was terrified and could not go back to sleep.  The SOB resolved when she went back on her back.  Since that episode she has not had any further discomfort.  She now presents today for evaluation.  She is feeling fine now with no CP but has some mild SOB.  She denies any LE edema although she has had a baker's cyst.      Past Medical History  Diagnosis Date  . Cellulitis and abscess of upper arm and forearm   . Postmastectomy lymphedema rt side  . History of right mastectomy 2004    chemo/ radiation  . Migraines   . Cancer ([place]) rt breast   2004/ chemo/ radiation  . Allergy   . Hyperlipidemia   . PONV (postoperative nausea and vomiting)     cannot take zofran  . GERD (gastroesophageal reflux disease)   . Sentinel node   . Anxiety     Past Surgical History  Procedure Laterality Date  . Rt mastectomy  2004    lymph nodes-7-axillary node dissection  . Nasal sinus surgery  1990/2001  . Mandible surgery  1991  . Port-a-cath removal      in and now out  . Nasal sinus surgery    . Mouth surgery    . Orif finger fracture  02/14/2012    Procedure: OPEN REDUCTION INTERNAL FIXATION (ORIF) METACARPAL (FINGER) FRACTURE;  Surgeon: Wynonia Sours, MD;  Location: [facility];  Service: Orthopedics;  Laterality: Right;  RIGHT FIFTH   . Breast lumpectomy  2004     br bx  . Orif metacarpal fracture  8/13    rt   . Repair extensor tendon Left 05/30/2013    Procedure: RELEASE TRANSPOSITION EXTENSOR POLLICUS LONGUS LEFT WRIST;  Surgeon: Wynonia Sours, MD;  Location: [facility];  Service: Orthopedics;  Laterality: Left;  . Breast lumpectomy with axillary lymph node dissection    . Carpal tunnel release    . Wrist arthroscopy Left 2014  . Shoulder arthroscopy with rotator cuff repair and subacromial decompression Left 11/22/2013    Procedure: LEFT SHOULDER ARTHROSCOPY WITH SUBACROMIAL DECOMPRESSION DISTAL CLAVICLE RESECTION REPAIR  ROTATOR CUFF AS NEEDED ;  Surgeon: Cammie Sickle., MD;  Location: [facility];  Service: Orthopedics;  Laterality: Left;  . Cataract extraction Left 07/12/2013     Current Outpatient Prescriptions  Medication Sig Dispense Refill  . ALPRAZOLAM XR 1 MG 24 hr tablet Take 2 mg by mouth at bedtime. TO HELP WITH SLEEP    . calcium carbonate (OS-CAL) 600 MG TABS Take 600 mg by mouth 2 (two) times daily with a meal.      . carvedilol (COREG) 3.125 MG tablet Take 1 tablet (3.125 mg total) by mouth 2 (two) times daily with a meal. 60 tablet 9  . Cholecalciferol (VITAMIN D) 2000  UNITS CAPS Take 1 capsule by mouth daily.     Marland Kitchen eletriptan (RELPAX) 40 MG tablet Take 40 mg by mouth as needed for migraine or headache. One tablet by mouth at onset of headache. May repeat in 2 hours if headache persists or recurs.    Marland Kitchen ezetimibe (ZETIA) 10 MG tablet Take 10 mg by mouth at bedtime.     . fluticasone (FLONASE) 50 MCG/ACT nasal spray Place 2 sprays into both nostrils daily.     . Multiple Vitamins-Minerals (MULTIVITAMINS THER. W/MINERALS) TABS Take 1 tablet by mouth daily.      . Olopatadine HCl (PATADAY) 0.2 % SOLN Place 1 drop into both eyes daily.     . pantoprazole (PROTONIX) 40 MG tablet Take 40 mg by mouth daily as needed (for GERD).     . Probiotic Product (PROBIOTIC DAILY PO) Take by mouth daily.     . promethazine (PHENERGAN) 25 MG tablet Take 25 mg by mouth every 8 (eight) hours as needed for nausea or vomiting.     . venlafaxine XR (EFFEXOR-XR) 37.5 MG 24 hr capsule Take 1 capsule (37.5 mg total) by mouth daily with breakfast. 90 capsule 3   No current facility-administered medications for this visit.    Allergies:   Acetaminophen; Amoxicillin-pot clavulanate; Azithromycin; Cefuroxime axetil; Cephalexin; Hydrocodone-acetaminophen; Hydrocodone-acetaminophen; Levofloxacin; Morphine and related; Ondansetron; Propoxyphene n-acetaminophen; Rosuvastatin; Sulfonamide derivatives; Tramadol; Vioxx; Zofran; Clarithromycin; Codeine; and Sulfamethoxazole    Social History:  The patient  reports that she has never smoked. She has never used smokeless tobacco. She reports that she does not drink alcohol or use illicit drugs.   Family History:  The patient's family history includes Cancer in her maternal aunt; Diabetes in her brother; Heart disease in her father, mother, paternal grandfather, paternal grandmother, and paternal uncle; Hypertension in her mother.    ROS:  Please see the history of present illness.   Otherwise, review of systems are positive for none.   All other  systems are reviewed and negative.    PHYSICAL EXAM: VS:  BP 132/70 mmHg  Pulse 67  Ht 5\' 3"  (1.6 m)  Wt 135 lb (61.236 kg)  BMI 23.92 kg/m2  LMP 01/10/2003 , BMI Body mass index is 23.92 kg/(m^2). GEN: Well nourished, well developed, in no acute distress HEENT: normal Neck: no JVD, carotid bruits, or masses Cardiac: RRR; no murmurs, rubs, or gallops,no edema  Respiratory:  clear to auscultation bilaterally, normal work of breathing GI: soft, nontender, nondistended, + BS MS: no deformity or atrophy Skin: warm and dry, no rash Neuro:  Strength and sensation are intact Psych: euthymic mood, full affect   EKG:  EKG is ordered today. The ekg ordered today demonstrates NSR with LBBB   Recent Labs: 10/15/2014: ALT 16; BUN 15.1; Creatinine 0.9; HGB 14.0; Platelets 278; Potassium 4.1; Sodium 142    Lipid Panel    Component Value Date/Time   CHOL 225* 05/04/2010 0827   TRIG 232.0* 05/04/2010 0827   HDL 38.20* 05/04/2010 0827   CHOLHDL 6 05/04/2010 0827   VLDL 46.4* 05/04/2010 0827   LDLDIRECT 145.4 05/04/2010 0827      Wt Readings from Last 3 Encounters:  09/23/15 135 lb (61.236 kg)  09/12/15 139 lb (63.05 kg)  06/13/15 143 lb (64.864 kg)    ASSESSMENT AND PLAN:  1. LBBB - new since 2011.Echocardiogram showed very mild left ventricular dysfunction. The Myoview study showed no evidence of ischemia and showed normal left ventricular function in 2011 and remote history of normal coronary arteries on cath.   2. H/O Chest wall pain secondary to costochondritis. Continue  Motrin as needed.  3. 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  EF on last assessment of 40-45%.  4. Chronic combined systolic and diastolic congestive heart failure: She has mildly depressed left ventricle systolic motion. She is tolerating the Coreg but Only takes 6. 25 once a day due to fatigue.  She appears euvolemic on exam.    5.  Palpitations -  I have recommended that she wear an event monitor to make sure  she does not have any PAF.    6 . Chest pain that was very brief and only lasted 3 minutes. She described it as a pressure that prevented her from taking a deep breath.  She says that she was asleep but only light sleep during the whole thing and when she rolled over on her back she woke up and it went away.  ? Etiology.  Unlikely due to cardiac etiology.  SHe has had a normal cath in the past and 2 normal  Nuclear stress tests, the last being several months ago.  I will check a stat troponin and D-dimer.  If normal will defer to Dr. Acie Fredrickson for further workup.    Current medicines are reviewed at length with the patient today.  The patient does not have concerns regarding medicines.  The following changes have been made:  no change  Labs/ tests ordered today: See above Assessment and Plan No orders of the defined types were placed in this encounter.     Disposition:   FU with  Dr. Acie Fredrickson in 2 weeks  Signed, Sueanne Margarita, MD  09/23/2015 11:56 AM    [address], [place], [place]  [address] Phone: [phone]; Fax: [phone]

## 2015-09-23 NOTE — Telephone Encounter (Signed)
Returned call to patient.She stated she had a episode last night after she laid down.Stated she felt like her heart was beating hard.Stated she had a severe squeezing pain in chest that lasted appox 3 mins.Stated when she had squeezing she became sob,could not talk.Stated she could not sleep after episode made her very nervous and scared.Stated she has not had any chest pain or sob this morning.Stated she would like to be seen.Appointment scheduled with DOD Dr.Turner this morning at 11:30 am.Advised to go to ER if she has chest pain returns.

## 2015-09-23 NOTE — Telephone Encounter (Signed)
New Msg---Kylie Tucker is calling because on last night she had so much chest pressure , that felt like someone was squeezing the breath out of her. Couldn't speak , was able to turn over to her onto her back it stopped.   Thanks

## 2015-09-23 NOTE — Patient Instructions (Signed)
Medication Instructions:  Your physician recommends that you continue on your current medications as directed. Please refer to the Current Medication list given to you today.   Labwork: TODAY: BNP, DDIMER, Troponin  Testing/Procedures: Your physician has recommended that you wear an event monitor. Event monitors are medical devices that record the heart's electrical activity. Doctors most often [place] these monitors to diagnose arrhythmias. Arrhythmias are problems with the speed or rhythm of the heartbeat. The monitor is a small, portable device. You can wear one while you do your normal daily activities. This is usually used to diagnose what is causing palpitations/syncope (passing out).  Follow-Up: Your physician recommends that you schedule a follow-up appointment in: 2 WEEKS with Dr. Acie Fredrickson.  Any Other Special Instructions Will Be Listed Below (If Applicable).     If you need a refill on your cardiac medications before your next appointment, please call your pharmacy.

## 2015-09-23 NOTE — Addendum Note (Signed)
Addended by: Harland German A on: 09/23/2015 01:28 PM   Modules accepted: Medications

## 2015-09-24 ENCOUNTER — Encounter: Payer: Self-pay | Admitting: Cardiology

## 2015-09-26 ENCOUNTER — Encounter: Payer: Self-pay | Admitting: Obstetrics and Gynecology

## 2015-09-26 ENCOUNTER — Ambulatory Visit (INDEPENDENT_AMBULATORY_CARE_PROVIDER_SITE_OTHER): Payer: Commercial Managed Care - HMO | Admitting: Obstetrics and Gynecology

## 2015-09-26 VITALS — BP 150/82 | HR 70 | Resp 14 | Ht 62.5 in | Wt 135.4 lb

## 2015-09-26 DIAGNOSIS — Z01419 Encounter for gynecological examination (general) (routine) without abnormal findings: Secondary | ICD-10-CM | POA: Diagnosis not present

## 2015-09-26 NOTE — Progress Notes (Signed)
Patient ID: Kylie Tucker, female   DOB: [birth year], 65 y.o.   MRN: [id number] 65 y.o. G3P2 Married Caucasian female here for annual exam.    Patient reports vaginal odor after her examination is completed.  Cutting back on caffeine, sweets, and sodium and has lost weight.  Weighed 142 last year.   Wearing a Holter Monitor for new dx left BBB.  Off Effexor now for one week. No real hot flashes yet.   Notes nipple discharge that is cream colored. Not all the time.  Dr. Lucia Gaskins is aware of this and has evaluated this.  Normal TSH and prolactin in 2015.   Retired in June last year.  Worked for [employer].   PCP:  Redmond School, MD   Patient's last menstrual period was 01/10/2003.          Sexually active: Yes.   female The current method of family planning is post menopausal status.    Exercising: No.   Smoker:  no  Health Maintenance: Pap:  08-01-12 Neg:Neg HR HPV History of abnormal Pap:  Yes, hx cryotherapy to cervix in her 30s MMG:  04-04-15 Unilateral Left/Density Cat.C/Rt.mastectomy/Neg/BiRads1:[facility] Colonoscopy:  2015 polyp with Dr. Buccini;next due 2020 BMD:12-02-14   Result  Osteopenia.  Dr. Jana Hakim currently following this. TDaP:  Up to date with PCP Screening Labs:  Hb today: PCP, Urine today: PCP   reports that she has never smoked. She has never used smokeless tobacco. She reports that she does not drink alcohol or use illicit drugs.  Past Medical History  Diagnosis Date  . Cellulitis and abscess of upper arm and forearm   . Postmastectomy lymphedema rt side  . History of right mastectomy 2004    chemo/ radiation  . Migraines   . Cancer ([place]) rt breast    2004/ chemo/ radiation  . Allergy   . Hyperlipidemia   . PONV (postoperative nausea and vomiting)     cannot take zofran  . GERD (gastroesophageal reflux disease)   . Sentinel node   . Anxiety   . Left bundle branch block 2016    Past Surgical History  Procedure Laterality Date  . Rt  mastectomy  2004    lymph nodes-7-axillary node dissection  . Nasal sinus surgery  1990/2001  . Mandible surgery  1991  . Port-a-cath removal      in and now out  . Nasal sinus surgery    . Mouth surgery    . Orif finger fracture  02/14/2012    Procedure: OPEN REDUCTION INTERNAL FIXATION (ORIF) METACARPAL (FINGER) FRACTURE;  Surgeon: Wynonia Sours, MD;  Location: [facility];  Service: Orthopedics;  Laterality: Right;  RIGHT FIFTH   . Breast lumpectomy  2004     br bx  . Orif metacarpal fracture  8/13    rt   . Repair extensor tendon Left 05/30/2013    Procedure: RELEASE TRANSPOSITION EXTENSOR POLLICUS LONGUS LEFT WRIST;  Surgeon: Wynonia Sours, MD;  Location: [facility];  Service: Orthopedics;  Laterality: Left;  . Breast lumpectomy with axillary lymph node dissection    . Carpal tunnel release    . Wrist arthroscopy Left 2014  . Shoulder arthroscopy with rotator cuff repair and subacromial decompression Left 11/22/2013    Procedure: LEFT SHOULDER ARTHROSCOPY WITH SUBACROMIAL DECOMPRESSION DISTAL CLAVICLE RESECTION REPAIR  ROTATOR CUFF AS NEEDED ;  Surgeon: Cammie Sickle., MD;  Location: [facility];  Service: Orthopedics;  Laterality: Left;  .  Cataract extraction Left 07/12/2013    Current Outpatient Prescriptions  Medication Sig Dispense Refill  . ALPRAZOLAM XR 1 MG 24 hr tablet Take 2 mg by mouth at bedtime. TO HELP WITH SLEEP    . calcium carbonate (OS-CAL) 600 MG TABS Take 600 mg by mouth 2 (two) times daily with a meal.      . carvedilol (COREG) 3.125 MG tablet Take 1 tablet (3.125 mg total) by mouth 2 (two) times daily with a meal. 60 tablet 9  . Cholecalciferol (VITAMIN D) 2000 UNITS CAPS Take 1 capsule by mouth daily.     Marland Kitchen eletriptan (RELPAX) 40 MG tablet Take 40 mg by mouth as needed for migraine or headache. One tablet by mouth at onset of headache. May repeat in 2 hours if headache persists or recurs.    Marland Kitchen ezetimibe (ZETIA) 10 MG  tablet Take 10 mg by mouth at bedtime.     . fluticasone (FLONASE) 50 MCG/ACT nasal spray Place 2 sprays into both nostrils daily.     . Multiple Vitamins-Minerals (MULTIVITAMINS THER. W/MINERALS) TABS Take 1 tablet by mouth daily.      . Olopatadine HCl (PATADAY) 0.2 % SOLN Place 1 drop into both eyes as needed.     . pantoprazole (PROTONIX) 40 MG tablet Take 40 mg by mouth daily as needed (for GERD).     . Probiotic Product (PROBIOTIC DAILY PO) Take by mouth daily.     . promethazine (PHENERGAN) 25 MG tablet Take 25 mg by mouth every 8 (eight) hours as needed for nausea or vomiting.      No current facility-administered medications for this visit.    Family History  Problem Relation Age of Onset  . Heart disease Father   . Cancer Maternal Aunt     breast  . Heart disease Paternal Uncle   . Heart disease Paternal Grandmother   . Heart disease Paternal Grandfather   . Heart disease Mother   . Hypertension Mother   . Diabetes Brother     ROS:  Pertinent items are noted in HPI.  Otherwise, a comprehensive ROS was negative.  Exam:   BP 150/82 mmHg  Pulse 70  Resp 14  Ht 5' 2.5" (1.588 m)  Wt 135 lb 6.4 oz (61.417 kg)  BMI 24.35 kg/m2  LMP 01/10/2003    General appearance: alert, cooperative and appears stated age Head: Normocephalic, without obvious abnormality, atraumatic Neck: no adenopathy, supple, symmetrical, trachea midline and thyroid normal to inspection and palpation Lungs: clear to auscultation bilaterally Breasts:left breast -  normal appearance, no masses or tenderness, Inspection negative, No nipple retraction or dimpling, No nipple discharge or bleeding, No axillary or supraclavicular adenopathy, right breast absent.  Heart: regular rate and rhythm.  Holter monitor on. Abdomen: soft, non-tender; bowel sounds normal; no masses,  no organomegaly Extremities: extremities normal, atraumatic, no cyanosis or edema Skin: Skin color, texture, turgor normal. No rashes or  lesions Lymph nodes: Cervical, supraclavicular, and axillary nodes normal. No abnormal inguinal nodes palpated Neurologic: Grossly normal  Pelvic: External genitalia:  no lesions              Urethra:  normal appearing urethra with no masses, tenderness or lesions              Bartholins and Skenes: normal                 Vagina: normal appearing vagina with normal color and discharge, no lesions  Cervix: no lesions              Pap taken: Yes. Bimanual Exam:  Uterus:  normal size, contour, position, consistency, mobility, non-tender              Adnexa: normal adnexa and no mass, fullness, tenderness              Rectovaginal: Yes.  .  Confirms.              Anus:  normal sphincter tone, no lesions  Chaperone was present for exam.  Assessment:   Well woman visit with normal exam. Hx right mastectomy.  Right lymphedema.  Left nipple discharge which has been evaluated with labs and general surgery consultation.  Hx cryotherapy.  Vaginal odor reported after examination was completed. Osteopenia.   Plan: Yearly mammogram recommended after age 42.  Recommended self breast exam.  Pap and HR HPV as above. Pap can detect vaginitis.  Discussed Calcium, Vitamin D, regular exercise program including cardiovascular and weight bearing exercise. Labs performed.  No..     Refills given on medications.  No..    Bone density due in 2018. Follow up annually and prn.     After visit summary provided.

## 2015-09-26 NOTE — Patient Instructions (Signed)

## 2015-09-30 LAB — IPS PAP TEST WITH HPV

## 2015-10-15 ENCOUNTER — Ambulatory Visit (INDEPENDENT_AMBULATORY_CARE_PROVIDER_SITE_OTHER): Payer: Commercial Managed Care - HMO | Admitting: Cardiovascular Disease

## 2015-10-15 ENCOUNTER — Encounter: Payer: Self-pay | Admitting: Cardiovascular Disease

## 2015-10-15 VITALS — BP 150/96 | HR 70 | Ht 62.5 in | Wt 129.8 lb

## 2015-10-15 DIAGNOSIS — R0600 Dyspnea, unspecified: Secondary | ICD-10-CM | POA: Diagnosis not present

## 2015-10-15 DIAGNOSIS — I447 Left bundle-branch block, unspecified: Secondary | ICD-10-CM | POA: Diagnosis not present

## 2015-10-15 DIAGNOSIS — I5022 Chronic systolic (congestive) heart failure: Secondary | ICD-10-CM

## 2015-10-15 MED ORDER — LOSARTAN POTASSIUM 50 MG PO TABS: 50.0000 mg | ORAL_TABLET | Freq: Every day | ORAL | Status: DC

## 2015-10-15 NOTE — Patient Instructions (Signed)
Medication Instructions:  STOP Carvedilol (Coreg) START Losartan 50 mg once daily   Labwork: Your physician recommends that you return for lab work in: 3 weeks for basic metabolic panel   Testing/Procedures: None Ordered   Follow-Up: Your physician wants you to follow-up in: 3 months with Dr. Acie Fredrickson.  You will receive a reminder letter in the mail two months in advance. If you don't receive a letter, please call our office to schedule the follow-up appointment.   If you need a refill on your cardiac medications before your next appointment, please call your pharmacy.   Thank you for choosing [facility]! Christen Bame, RN [phone]

## 2015-10-15 NOTE — Progress Notes (Signed)
Cardiology Office Note   Date:  10/15/2015   ID:  Kylie Tucker, DOB [birth year], MRN [id number]  PCP:  Glo Herring., MD  Cardiologist:   Thayer Headings, MD   Chief Complaint  Patient presents with  . Follow-up   Problem List:  1. Atypical chest pain 2. Breast cancer ( right breast , 2004) s/p surgery and XRT.  3. LBBB  4. Chronic combined systolic and diastolic CHF    History of Present Illness: Kylie Tucker is a 65 y.o. female who presents for evaluation of some atypical chest pain . Has lots of fatigue. Has leg heaviness and fatigue. Has some back pain - sciatic nerve issues  The sharp chest pain would ease if she changed position or takes a deep breath  Does not get any exercise recently - mostly due to back pain   She had a normal cardiac cath in the remote past   Oct. 28, 2016:  Michaele had an echo that revealed moderate LV dysfunction  Left ventricle: The cavity size was normal. Systolic function was mildly to moderately reduced. The estimated ejection fraction was in the range of 40% to 45%. Moderate diffuse hypokinesis with no identifiable regional variations. Features are consistent with a pseudonormal left ventricular filling pattern, with concomitant abnormal relaxation and increased filling pressure (grade 2 diastolic dysfunction). - Ventricular septum: Septal motion showed paradox. These changes are consistent with a left bundle branch block. - Mitral valve: There was moderate regurgitation directed centrally   Dec. 2, 2016:  Seen for follow up Tolerating thecoreg.   Did not tolerate the Benicar.  Avoiding salt. Still is very fatigued - especially with walking or any exertion.  September 12, 2015:  Doing well.    Is taking Coreg 6. 25 mg only once a day .  Does not tolerate the higher dose.   October 15, 2015:  Kylie Tucker is seen Today for follow-up visit. She was seen about 3 weeks ago with an episode of chest pain and  shortness breath that woke her up in the middle of the night.    It took her quite a long time to get relaxed before she got to sleep   The pain lasted for about 3-5 minutes.    Squeezing sensation .    Not related to eating or drinking .   She was seen by Dr. Radford Pax. Laboratory data at that time was normal including a normal d-dimer, normal troponin, and a normal BNP .  She thinks that it was "sleep paralysis"  (researched it online )  Turned from her right side to her back ,  Felt much better at that time  Was afraid to go back to sleep .   Has not had any other issues since that time. Has been exercising   Some ,   Has a right bakers cyst  - limits her walking  Has had some back injections that have also helped.  Has seen Dr. Percell Miller and he did not want to operate on this .   Is tolerating the coreg.  Brought a list a questions / concerns 1. Dry mouth -   2. Left hand fingers tingling - thinks its related to Coreg.  3.  BP has been elevated.   4. Stomach issues - rolling  Thinks these are related to her coreg.     Past Medical History  Diagnosis Date  . Cellulitis and abscess of upper arm and forearm   . Postmastectomy lymphedema  rt side  . History of right mastectomy 2004    chemo/ radiation  . Migraines   . Cancer ([place]) rt breast    2004/ chemo/ radiation  . Allergy   . Hyperlipidemia   . PONV (postoperative nausea and vomiting)     cannot take zofran  . GERD (gastroesophageal reflux disease)   . Sentinel node   . Anxiety   . Left bundle branch block 2016    Past Surgical History  Procedure Laterality Date  . Rt mastectomy  2004    lymph nodes-7-axillary node dissection  . Nasal sinus surgery  1990/2001  . Mandible surgery  1991  . Port-a-cath removal      in and now out  . Nasal sinus surgery    . Mouth surgery    . Orif finger fracture  02/14/2012    Procedure: OPEN REDUCTION INTERNAL FIXATION (ORIF) METACARPAL (FINGER) FRACTURE;  Surgeon: Wynonia Sours, MD;   Location: [facility];  Service: Orthopedics;  Laterality: Right;  RIGHT FIFTH   . Breast lumpectomy  2004     br bx  . Orif metacarpal fracture  8/13    rt   . Repair extensor tendon Left 05/30/2013    Procedure: RELEASE TRANSPOSITION EXTENSOR POLLICUS LONGUS LEFT WRIST;  Surgeon: Wynonia Sours, MD;  Location: [facility];  Service: Orthopedics;  Laterality: Left;  . Breast lumpectomy with axillary lymph node dissection    . Carpal tunnel release    . Wrist arthroscopy Left 2014  . Shoulder arthroscopy with rotator cuff repair and subacromial decompression Left 11/22/2013    Procedure: LEFT SHOULDER ARTHROSCOPY WITH SUBACROMIAL DECOMPRESSION DISTAL CLAVICLE RESECTION REPAIR  ROTATOR CUFF AS NEEDED ;  Surgeon: Cammie Sickle., MD;  Location: [facility];  Service: Orthopedics;  Laterality: Left;  . Cataract extraction Left 07/12/2013     Current Outpatient Prescriptions  Medication Sig Dispense Refill  . ALPRAZOLAM XR 2 MG 24 hr tablet Take 2 mg by mouth 2 (two) times daily.    . calcium carbonate (OS-CAL) 600 MG TABS Take 600 mg by mouth 2 (two) times daily with a meal.      . carvedilol (COREG) 3.125 MG tablet Take 1 tablet (3.125 mg total) by mouth 2 (two) times daily with a meal. 60 tablet 9  . Cholecalciferol (VITAMIN D) 2000 UNITS CAPS Take 1 capsule by mouth daily.     Marland Kitchen eletriptan (RELPAX) 40 MG tablet Take 40 mg by mouth as needed for migraine or headache. One tablet by mouth at onset of headache. May repeat in 2 hours if headache persists or recurs.    Marland Kitchen ezetimibe (ZETIA) 10 MG tablet Take 10 mg by mouth at bedtime.     . fluticasone (FLONASE) 50 MCG/ACT nasal spray Place 2 sprays into both nostrils daily.     . Multiple Vitamins-Minerals (MULTIVITAMINS THER. W/MINERALS) TABS Take 1 tablet by mouth daily.      . Olopatadine HCl (PATADAY) 0.2 % SOLN Place 1 drop into both eyes as needed.     . pantoprazole (PROTONIX) 40 MG tablet Take 40  mg by mouth daily as needed (for GERD).     . Probiotic Product (PROBIOTIC DAILY PO) Take by mouth daily.     . promethazine (PHENERGAN) 25 MG tablet Take 25 mg by mouth every 8 (eight) hours as needed for nausea or vomiting.      No current facility-administered medications for this visit.  Allergies:   Acetaminophen; Amoxicillin-pot clavulanate; Azithromycin; Cefuroxime axetil; Cephalexin; Hydrocodone-acetaminophen; Hydrocodone-acetaminophen; Levofloxacin; Morphine and related; Ondansetron; Propoxyphene n-acetaminophen; Rosuvastatin; Sulfonamide derivatives; Tramadol; Vioxx; Zofran; Clarithromycin; Codeine; and Sulfamethoxazole    Social History:  The patient  reports that she has never smoked. She has never used smokeless tobacco. She reports that she does not drink alcohol or use illicit drugs.   Family History:  The patient's family history includes Cancer in her maternal aunt; Diabetes in her brother; Heart disease in her father, mother, paternal grandfather, paternal grandmother, and paternal uncle; Hypertension in her mother.    ROS:  Please see the history of present illness.    Review of Systems: Constitutional:  denies fever, chills, diaphoresis, appetite change and fatigue.  HEENT: denies photophobia, eye pain, redness, hearing loss, ear pain, congestion, sore throat, rhinorrhea, sneezing, neck pain, neck stiffness and tinnitus.  Respiratory: denies SOB, DOE, cough, chest tightness, and wheezing.  Cardiovascular: denies chest pain, palpitations and leg swelling.  Gastrointestinal: admits to  Some stomach issues  Genitourinary: denies dysuria, urgency, frequency, hematuria, flank pain and difficulty urinating.  Musculoskeletal: denies  myalgias, back pain, joint swelling, arthralgias and gait problem.   Skin: denies pallor, rash and wound.  Neurological: denies dizziness, seizures, syncope, weakness, light-headedness, numbness and headaches.   Hematological: denies  adenopathy, easy bruising, personal or family bleeding history.  Psychiatric/ Behavioral: denies suicidal ideation, mood changes, confusion, nervousness, sleep disturbance and agitation.       All other systems are reviewed and negative.    PHYSICAL EXAM: VS:  BP 150/96 mmHg  Pulse 70  Ht 5' 2.5" (1.588 m)  Wt 129 lb 12.8 oz (58.877 kg)  BMI 23.35 kg/m2  LMP 01/10/2003 , BMI Body mass index is 23.35 kg/(m^2). GEN: Well nourished, well developed, in no acute distress HEENT: normal Neck: no JVD, carotid bruits, or masses Cardiac: RRR; no murmurs,  Reverse splitting of the second heart sound   Respiratory:  clear to auscultation bilaterally, normal work of breathing GI: soft, nontender, nondistended, + BS MS: no deformity or atrophy, right arm is wrapped in an ace wrap  Skin: warm and dry, no rash Neuro:  Strength and sensation are intact Psych: normal   EKG:  EKG is ordered today. The ekg ordered today demonstrates NSR at 75.  LBBB  - the LBBB is new since previous tracing    Recent Labs: No results found for requested labs within last 365 days.    Lipid Panel    Component Value Date/Time   CHOL 225* 05/04/2010 0827   TRIG 232.0* 05/04/2010 0827   HDL 38.20* 05/04/2010 0827   CHOLHDL 6 05/04/2010 0827   VLDL 46.4* 05/04/2010 0827   LDLDIRECT 145.4 05/04/2010 0827      Wt Readings from Last 3 Encounters:  10/15/15 129 lb 12.8 oz (58.877 kg)  09/26/15 135 lb 6.4 oz (61.417 kg)  09/23/15 135 lb (61.236 kg)      Other studies Reviewed: Additional studies/ records that were reviewed today include: . Review of the above records demonstrates:    ASSESSMENT AND PLAN:  1.  LBBB - new since 2011.   Has had adriamycin.  Echocardiogram showed very mild left ventricular dysfunction. The Myoview study showed no evidence of ischemia and showed normal left ventricular function.    2. Chest wall pain. She clearly has costochondritis. Ive recommended  that she take  Motrin as needed.  3. Fatigue: I suspect this is because she does not sleep well.  Will DC the coreg .   She seems to have lots of side effects from it   4. Chronic combined systolic and diastolic  congestive heart failure: She has mildly depressed left ventricle systolic motion.   She is not tolerating the coreg.   Will try Losartan 50 mg a day . Will check labs in 3 weeks.  Will see her in 3 months .    Current medicines are reviewed at length with the patient today.  The patient does not have concerns regarding medicines.  The following changes have been made:  no change  Labs/ tests ordered today include:  No orders of the defined types were placed in this encounter.     Disposition:   FU with me in 3 months      Renold Kozar, Wonda Cheng, MD  10/15/2015 2:12 PM    [facility] [place], [place], [place]  [address] Phone: [phone]; Fax: [phone]   [facility]  [address] [place], [place]  [address] [phone]   Fax [phone]

## 2015-10-17 ENCOUNTER — Other Ambulatory Visit: Payer: Self-pay | Admitting: *Deleted

## 2015-10-17 DIAGNOSIS — C50911 Malignant neoplasm of unspecified site of right female breast: Secondary | ICD-10-CM

## 2015-10-20 ENCOUNTER — Telehealth: Payer: Self-pay | Admitting: Oncology

## 2015-10-20 ENCOUNTER — Ambulatory Visit (HOSPITAL_BASED_OUTPATIENT_CLINIC_OR_DEPARTMENT_OTHER): Payer: Commercial Managed Care - HMO | Admitting: Nurse Practitioner

## 2015-10-20 ENCOUNTER — Other Ambulatory Visit (HOSPITAL_BASED_OUTPATIENT_CLINIC_OR_DEPARTMENT_OTHER): Payer: Commercial Managed Care - HMO

## 2015-10-20 ENCOUNTER — Encounter: Payer: Self-pay | Admitting: Nurse Practitioner

## 2015-10-20 VITALS — BP 144/74 | HR 75 | Temp 97.7°F | Resp 18 | Ht 62.5 in | Wt 130.0 lb

## 2015-10-20 DIAGNOSIS — C50911 Malignant neoplasm of unspecified site of right female breast: Secondary | ICD-10-CM

## 2015-10-20 DIAGNOSIS — Z853 Personal history of malignant neoplasm of breast: Secondary | ICD-10-CM

## 2015-10-20 LAB — COMPREHENSIVE METABOLIC PANEL
ALK PHOS: 60 U/L (ref 40–150)
ALT: 16 U/L (ref 0–55)
AST: 21 U/L (ref 5–34)
Albumin: 3.7 g/dL (ref 3.5–5.0)
Anion Gap: 6 mEq/L (ref 3–11)
BUN: 11.6 mg/dL (ref 7.0–26.0)
CO2: 30 meq/L — AB (ref 22–29)
CREATININE: 0.9 mg/dL (ref 0.6–1.1)
Calcium: 9.7 mg/dL (ref 8.4–10.4)
Chloride: 104 mEq/L (ref 98–109)
EGFR: 65 mL/min/{1.73_m2} — AB (ref >90)
GLUCOSE: 96 mg/dL (ref 70–140)
Potassium: 4 mEq/L (ref 3.5–5.1)
Sodium: 140 mEq/L (ref 136–145)
TOTAL PROTEIN: 6.7 g/dL (ref 6.4–8.3)
Total Bilirubin: 0.5 mg/dL (ref 0.20–1.20)

## 2015-10-20 LAB — CBC WITH DIFFERENTIAL/PLATELET
BASO%: 1 % (ref 0.0–2.0)
Basophils Absolute: 0.1 10*3/uL (ref 0.0–0.1)
EOS ABS: 0.2 10*3/uL (ref 0.0–0.5)
EOS%: 2.7 % (ref 0.0–7.0)
HCT: 40.4 % (ref 34.8–46.6)
HEMOGLOBIN: 13.3 g/dL (ref 11.6–15.9)
LYMPH%: 36.8 % (ref 14.0–49.7)
MCH: 29.4 pg (ref 25.1–34.0)
MCHC: 33 g/dL (ref 31.5–36.0)
MCV: 89.1 fL (ref 79.5–101.0)
MONO#: 0.4 10*3/uL (ref 0.1–0.9)
MONO%: 7.5 % (ref 0.0–14.0)
NEUT%: 52 % (ref 38.4–76.8)
NEUTROS ABS: 3.1 10*3/uL (ref 1.5–6.5)
Platelets: 259 10*3/uL (ref 145–400)
RBC: 4.53 10*6/uL (ref 3.70–5.45)
RDW: 14 % (ref 11.2–14.5)
WBC: 5.9 10*3/uL (ref 3.9–10.3)
lymph#: 2.2 10*3/uL (ref 0.9–3.3)

## 2015-10-20 NOTE — Telephone Encounter (Signed)
Gave patient avs report and appointments for April 2018. Central radiology scheduling will call patient re mri - patient aware.

## 2015-10-20 NOTE — Progress Notes (Signed)
ID: Kylie Tucker   DOB: 22-Jul-1950  MR#: [id number]  DHR#:[id number]  PCP: Glo Herring., MD GYN: Rolly Pancake SU:  OTHER MD: Daryll Brod  CHIEF COMPLAINT: right breast cancer  CURRENT TREATMENT: observation  BREAST CANCER HISTORY: From the original intake note:  The patient palpated a lump in the lower outer quadrant of the right breast.  Mammography and ultrasound were performed on 08-31-02.  Mammogram was unremarkable, just showing dense breast tissue.  Ultrasound showed a single 9 mm. lesion at the 9:00 position, 5 cm. from the nipple, as well as several adjacent simple cysts.  Core biopsy of the lesion confirmed an invasive mammary carcinoma.  She also had bilateral breast MRI performed on 09-10-02, and this showed multiple enhancing parenchymal nodules bilaterally.  On 09-17-02, Dr. Alphonsa Overall performed an excisional biopsy of the right breast mass as well as sentinel lymph node biopsy followed by completion axillary dissection.  The lumpectomy specimen contained multifocal invasive mammary carcinoma, at least three separate foci, measuring 1.2, 0.9 and 0.6 cm.  Invasive tumor came within 0.5 mm. of the caudal margin and 1 mm. of the cephalic margin.  The excisional biopsy specimen grossly measured 8.8 x 4.8 x 3.4 cm.  The tumor was tubulolobular.  The tumor was associated with lymphovascular invasion.  A total of two out of three sentinel lymph nodes were involved with metastatic adenocarcinoma, and from axillary dissection, one of four lymph nodes were involved, none within the extracapsular extension.  However, there was also an intramammary lymph node involved, for a total of four out of eight positive lymph nodes.  The tumor was ER positive at 47% and PR positive at 17%, had an elevation proliferation marker of 44%, and was HER-2/neu 3+ positive. Her subsequent history is as detailed below  INTERVAL HISTORY: Kylie Tucker returns for followup of her breast cancer. The interval history is  remarkable for the discovery of a left bundle branch block. She was started on losartan and is now symptom free. She continue to exercise lightly with no issues.   REVIEW OF SYSTEMS: Kylie Tucker has few physical complaints. She continue to wrap her left upper arm daily because of lymphedema. This does not cause her pain. She has mild joint aches else were. She does not sleep well at night. She has occasional migraines. She complains of hearing and vision changes. A detailed review of systems is otherwise entirely negative.   PAST MEDICAL HISTORY: Past Medical History  Diagnosis Date  . Cellulitis and abscess of upper arm and forearm   . Postmastectomy lymphedema rt side  . History of right mastectomy 2004    chemo/ radiation  . Migraines   . Cancer ([place]) rt breast    2004/ chemo/ radiation  . Allergy   . Hyperlipidemia   . PONV (postoperative nausea and vomiting)     cannot take zofran  . GERD (gastroesophageal reflux disease)   . Sentinel node   . Anxiety   . Left bundle branch block 2016  She had some type of RT (?orthovoltage vs [place]) at [facility] in the late 70's to her lt. lower ribs to treat traumatic injury there, approx. 12 txs. No records available, no tattoos were left. Otherwise healthy except for some GE reflux.  She has undergone excision of breast cysts on the right in the 1980s and on the left in 2002.    PAST SURGICAL HISTORY: Past Surgical History  Procedure Laterality Date  . Rt mastectomy  2004    lymph  nodes-7-axillary node dissection  . Nasal sinus surgery  1990/2001  . Mandible surgery  1991  . Port-a-cath removal      in and now out  . Nasal sinus surgery    . Mouth surgery    . Orif finger fracture  02/14/2012    Procedure: OPEN REDUCTION INTERNAL FIXATION (ORIF) METACARPAL (FINGER) FRACTURE;  Surgeon: Wynonia Sours, MD;  Location: [facility];  Service: Orthopedics;  Laterality: Right;  RIGHT FIFTH   . Breast lumpectomy  2004     br bx  . Orif  metacarpal fracture  8/13    rt   . Repair extensor tendon Left 05/30/2013    Procedure: RELEASE TRANSPOSITION EXTENSOR POLLICUS LONGUS LEFT WRIST;  Surgeon: Wynonia Sours, MD;  Location: [facility];  Service: Orthopedics;  Laterality: Left;  . Breast lumpectomy with axillary lymph node dissection    . Carpal tunnel release    . Wrist arthroscopy Left 2014  . Shoulder arthroscopy with rotator cuff repair and subacromial decompression Left 11/22/2013    Procedure: LEFT SHOULDER ARTHROSCOPY WITH SUBACROMIAL DECOMPRESSION DISTAL CLAVICLE RESECTION REPAIR  ROTATOR CUFF AS NEEDED ;  Surgeon: Cammie Sickle., MD;  Location: [facility];  Service: Orthopedics;  Laterality: Left;  . Cataract extraction Left 07/12/2013    FAMILY HISTORY Family History  Problem Relation Age of Onset  . Heart disease Father   . Cancer Maternal Aunt     breast  . Heart disease Paternal Uncle   . Heart disease Paternal Grandmother   . Heart disease Paternal Grandfather   . Heart disease Mother   . Hypertension Mother   . Diabetes Brother   Her mother had rectal cancer, negative for breast or ovarian cancer.  GYNECOLOGIC HISTORY: GX P2   SOCIAL HISTORY: Married, has two children, worked at [employer]  in [place], but retired June 2015   ADVANCED DIRECTIVES: Husband iis healthcare power of Wanship: Social History  Substance Use Topics  . Smoking status: Never Smoker   . Smokeless tobacco: Never Used  . Alcohol Use: No     Colonoscopy:  PAP:  Bone density:  Lipid panel:  Allergies  Allergen Reactions  . Acetaminophen Nausea Only    Other reaction(s): Other (See Comments)  . Amoxicillin-Pot Clavulanate Nausea Only    flushing  . Azithromycin     Heart palpitations   . Cefuroxime Axetil Nausea Only  . Cephalexin Nausea Only  . Hydrocodone-Acetaminophen Hives  . Hydrocodone-Acetaminophen     Other reaction(s): GI Upset (intolerance)   . Levofloxacin Nausea Only  . Morphine And Related Other (See Comments)    Hallucinations  . Ondansetron Nausea And Vomiting  . Propoxyphene N-Acetaminophen Nausea And Vomiting  . Rosuvastatin     REACTION: myalgia  . Sulfonamide Derivatives Itching  . Tramadol Nausea And Vomiting  . Vioxx [Rofecoxib] Nausea And Vomiting  . Zofran [Ondansetron Hcl] Nausea And Vomiting  . Clarithromycin Nausea Only and Anxiety  . Codeine Itching and Rash  . Sulfamethoxazole Rash    Current Outpatient Prescriptions  Medication Sig Dispense Refill  . ALPRAZOLAM XR 2 MG 24 hr tablet Take 2 mg by mouth 2 (two) times daily.    . calcium carbonate (OS-CAL) 600 MG TABS Take 600 mg by mouth 2 (two) times daily with a meal.      . Cholecalciferol (VITAMIN D) 2000 UNITS CAPS Take 1 capsule by mouth daily.     Marland Kitchen  ezetimibe (ZETIA) 10 MG tablet Take 10 mg by mouth at bedtime.     Marland Kitchen losartan (COZAAR) 50 MG tablet Take 1 tablet (50 mg total) by mouth daily. 30 tablet 11  . Multiple Vitamins-Minerals (MULTIVITAMINS THER. W/MINERALS) TABS Take 1 tablet by mouth daily.      . Olopatadine HCl (PATADAY) 0.2 % SOLN Place 1 drop into both eyes as needed.     . pantoprazole (PROTONIX) 40 MG tablet Take 40 mg by mouth daily as needed (for GERD).     . Probiotic Product (PROBIOTIC DAILY PO) Take by mouth daily.     Marland Kitchen eletriptan (RELPAX) 40 MG tablet Take 40 mg by mouth as needed for migraine or headache. Reported on 10/20/2015    . promethazine (PHENERGAN) 25 MG tablet Take 25 mg by mouth every 8 (eight) hours as needed for nausea or vomiting. Reported on 10/20/2015     No current facility-administered medications for this visit.    OBJECTIVE: Middle-aged white woman who appears well Filed Vitals:   10/20/15 1421  BP: 144/74  Pulse: 75  Temp: 97.7 F (36.5 C)  Resp: 18     Body mass index is 23.38 kg/(m^2).    ECOG FS: 1  Skin: warm, dry  HEENT: sclerae anicteric, conjunctivae pink, oropharynx clear. No thrush or  mucositis.  Lymph Nodes: No cervical or supraclavicular lymphadenopathy  Lungs: clear to auscultation bilaterally, no rales, wheezes, or rhonci  Heart: regular rate and rhythm  Abdomen: round, soft, non tender, positive bowel sounds  Musculoskeletal: No focal spinal tenderness, left upper extremity lymphedema wrapped in ace banadages Neuro: non focal, well oriented, positive affect  Breasts: right breast status post mastectomy. No evidence of recurrent diease. Right axilla benign. Left breast unremarkable.  LAB RESULTS: Lab Results  Component Value Date   WBC 5.9 10/20/2015   NEUTROABS 3.1 10/20/2015   HGB 13.3 10/20/2015   HCT 40.4 10/20/2015   MCV 89.1 10/20/2015   PLT 259 10/20/2015      Chemistry      Component Value Date/Time   NA 140 10/20/2015 1403   NA 142 06/28/2011 2200   K 4.0 10/20/2015 1403   K 3.9 06/28/2011 2200   CL 103 12/08/2012 0810   CL 105 06/28/2011 2200   CO2 30* 10/20/2015 1403   CO2 31 04/14/2011 0523   BUN 11.6 10/20/2015 1403   BUN 17 06/28/2011 2200   CREATININE 0.9 10/20/2015 1403   CREATININE 1.00 06/28/2011 2200      Component Value Date/Time   CALCIUM 9.7 10/20/2015 1403   CALCIUM 8.8 04/14/2011 0523   ALKPHOS 60 10/20/2015 1403   ALKPHOS 99 02/16/2011 1626   AST 21 10/20/2015 1403   AST 21 02/16/2011 1626   ALT 16 10/20/2015 1403   ALT 20 02/16/2011 1626   BILITOT 0.50 10/20/2015 1403   BILITOT 0.2* 02/16/2011 1626       Lab Results  Component Value Date   LABCA2 26 02/16/2011    No components found for: [id number]  No results for input(s): INR in the last 168 hours.  Urinalysis    Component Value Date/Time   COLORURINE YELLOW 10/23/2009 0057   APPEARANCEUR CLOUDY* 10/23/2009 0057   LABSPEC 1.025 06/29/2010 1418   PHURINE 6.0 06/29/2010 1418   GLUCOSEU NEGATIVE 06/29/2010 1418   HGBUR NEGATIVE 06/29/2010 1418   BILIRUBINUR - 09/10/2014 [place] 06/29/2010 1418   KETONESUR NEGATIVE 06/29/2010 1418    PROTEINUR - 09/10/2014 1130  PROTEINUR NEGATIVE 06/29/2010 1418   UROBILINOGEN negative 09/10/2014 1130   UROBILINOGEN 0.2 06/29/2010 1418   NITRITE - 09/10/2014 1130   NITRITE NEGATIVE 06/29/2010 1418   LEUKOCYTESUR Negative 09/10/2014 1130    STUDIES: No results found.  EXAM: DIGITAL SCREENING UNILATERAL LEFT MAMMOGRAM WITH TOMO AND CAD  COMPARISON: Previous exam(s).  ACR Breast Density Category c: The breast tissue is heterogeneously dense, which may obscure small masses.  FINDINGS: The patient has had a right mastectomy. There are no findings suspicious for malignancy.  Images were processed with CAD.  IMPRESSION: No mammographic evidence of malignancy. A result letter of this screening mammogram will be mailed directly to the patient.  RECOMMENDATION: Screening mammogram in one year. ([id number])  BI-RADS CATEGORY 1: Negative.   Electronically Signed  By: Fidela Salisbury M.D.  On: 04/07/2015 08:17  ASSESSMENT: 65 y.o. 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 She took tamoxifen between December of 2004 and July of 2006 at which point she was switched to anastrozole, completing her 5 years of anastrozole in December of 2011  PLAN:  Ania is doing well as far as her breast cancer is concerned. She is now 13 years out from her right mastectomy with no evidence of recurrent diease. She gets mammograms every fall and breast MRI's every other year because her breast tissue is dense. She is due for an MRI this year, and I have placed the appropriate orders during this visit.   Keari will return in 1 year for labs and a follow up visit, though she knows that she could "graduate" from observation at any time. She understands and agrees with  this plan. She has been encouraged to call with any issues that might arise before her next visit here.  Genelle Gather Boelter    10/20/2015

## 2015-10-23 ENCOUNTER — Other Ambulatory Visit: Payer: Self-pay

## 2015-11-06 ENCOUNTER — Other Ambulatory Visit: Payer: Self-pay | Admitting: *Deleted

## 2015-11-06 DIAGNOSIS — R922 Inconclusive mammogram: Secondary | ICD-10-CM

## 2015-11-06 DIAGNOSIS — Z853 Personal history of malignant neoplasm of breast: Secondary | ICD-10-CM

## 2015-11-06 DIAGNOSIS — C50911 Malignant neoplasm of unspecified site of right female breast: Secondary | ICD-10-CM

## 2015-11-07 ENCOUNTER — Other Ambulatory Visit: Payer: Commercial Managed Care - HMO | Admitting: *Deleted

## 2015-11-07 LAB — BASIC METABOLIC PANEL
BUN: 14 mg/dL (ref 7–25)
CALCIUM: 10.1 mg/dL (ref 8.6–10.4)
CO2: 29 mmol/L (ref 20–31)
Chloride: 103 mmol/L (ref 98–110)
Creat: 0.92 mg/dL (ref 0.50–0.99)
Glucose, Bld: 100 mg/dL — ABNORMAL HIGH (ref 65–99)
Potassium: 4.9 mmol/L (ref 3.5–5.3)
SODIUM: 143 mmol/L (ref 135–146)

## 2015-11-10 ENCOUNTER — Telehealth: Payer: Self-pay | Admitting: Cardiovascular Disease

## 2015-11-10 NOTE — Telephone Encounter (Signed)
Spoke with patient who states she did not have any adverse symptoms for the first week after starting Losartan on 4/7.  States starting having stomach issues on 4/20 including diarrhea and lack of appetite.  States has a stinging sensation on her tongue when she puts the pill in her mouth. She states yesterday she was having alternating feelings of being hot and having chills.  States her BP has been good prior to this weekend when the stomach problems became especially bothersome.  She had many of the same symptoms on carvedilol.  I asked her if she has tried taking the medication at a different time of day and she has had problems with taking the medicine both in the morning and evening without symptom relief. She denies that these stomach issues occur when aside from times she associates with taking losartan and carvedilol.  I advised her that I will forward to Dr. Acie Fredrickson for advice and call her back.  She verbalized understanding and agreement.

## 2015-11-10 NOTE — Telephone Encounter (Signed)
New Message  Pt requested to speak w/ RN concerning Losartan- pt c/o diarrhea, no appetite, some discomfort on her tongue. Pt also c/o chills. Please call back and discuss.

## 2015-11-11 ENCOUNTER — Encounter: Payer: Self-pay | Admitting: Oncology

## 2015-11-11 ENCOUNTER — Other Ambulatory Visit: Payer: Self-pay | Admitting: Oncology

## 2015-11-11 DIAGNOSIS — C50911 Malignant neoplasm of unspecified site of right female breast: Secondary | ICD-10-CM

## 2015-11-11 MED ORDER — NEBIVOLOL HCL 5 MG PO TABS: 5.0000 mg | ORAL_TABLET | Freq: Every day | ORAL | Status: DC

## 2015-11-11 NOTE — Telephone Encounter (Signed)
Spoke with patient and offered information about [facility] in [facility] where Dr. Acie Fredrickson recommends patients for integrated medicine.  I advised her that they do not take referrals, she will need to call them to discuss symptoms and schedule appointment.  I advised that I can place bystolic 5 mg samples at front desk for her to pick up if she would like to try this medication.  I advised her to d/c Losartan (she has not taken today and has been taking 1/2 tab for the past few days) and to start bystolic 5 mg tomorrow.  She verbalized understanding and agreement.

## 2015-11-11 NOTE — Telephone Encounter (Signed)
F/u  Pt following up from yesterday 5/1 p[hone note. Please call back and discuss.

## 2015-11-11 NOTE — Telephone Encounter (Signed)
She has many intolerences to multiple meds.  She may benefit from an integrated health doctor . Could try Bystolic 5 mg a day .

## 2015-11-12 ENCOUNTER — Telehealth: Payer: Self-pay | Admitting: Oncology

## 2015-11-12 ENCOUNTER — Other Ambulatory Visit: Payer: Self-pay | Admitting: Oncology

## 2015-11-12 ENCOUNTER — Other Ambulatory Visit: Payer: Self-pay | Admitting: *Deleted

## 2015-11-12 ENCOUNTER — Telehealth: Payer: Self-pay | Admitting: Cardiovascular Disease

## 2015-11-12 DIAGNOSIS — Z853 Personal history of malignant neoplasm of breast: Secondary | ICD-10-CM

## 2015-11-12 DIAGNOSIS — Z9011 Acquired absence of right breast and nipple: Secondary | ICD-10-CM

## 2015-11-12 NOTE — Telephone Encounter (Signed)
New message   Pt verbalized that RN will have samples at check in for pt to pick up   She wants to know the name of the medication so she can look it up  Its a betablocker

## 2015-11-12 NOTE — Telephone Encounter (Signed)
Spoke with patient and gave her name of bystolic which is the medication prescribed yesterday by Dr. Acie Fredrickson

## 2015-11-12 NOTE — Telephone Encounter (Signed)
Left voicemail to inform patient of mammogram scheduled for Sept and gave [facility] and [facility]

## 2015-11-18 ENCOUNTER — Emergency Department (HOSPITAL_COMMUNITY)
Admission: EM | Admit: 2015-11-18 | Discharge: 2015-11-18 | Disposition: A | Discharge status: Home / Self Care | Payer: Commercial Managed Care - HMO | Attending: Emergency Medicine | Admitting: Emergency Medicine

## 2015-11-18 ENCOUNTER — Encounter (HOSPITAL_COMMUNITY): Payer: Self-pay

## 2015-11-18 ENCOUNTER — Emergency Department (HOSPITAL_COMMUNITY): Payer: Commercial Managed Care - HMO

## 2015-11-18 DIAGNOSIS — Z872 Personal history of diseases of the skin and subcutaneous tissue: Secondary | ICD-10-CM | POA: Insufficient documentation

## 2015-11-18 DIAGNOSIS — R112 Nausea with vomiting, unspecified: Secondary | ICD-10-CM | POA: Diagnosis not present

## 2015-11-18 DIAGNOSIS — Z859 Personal history of malignant neoplasm, unspecified: Secondary | ICD-10-CM | POA: Diagnosis not present

## 2015-11-18 DIAGNOSIS — R109 Unspecified abdominal pain: Secondary | ICD-10-CM | POA: Insufficient documentation

## 2015-11-18 DIAGNOSIS — Z79899 Other long term (current) drug therapy: Secondary | ICD-10-CM | POA: Insufficient documentation

## 2015-11-18 DIAGNOSIS — E785 Hyperlipidemia, unspecified: Secondary | ICD-10-CM | POA: Diagnosis not present

## 2015-11-18 DIAGNOSIS — Z880 Allergy status to penicillin: Secondary | ICD-10-CM | POA: Diagnosis not present

## 2015-11-18 DIAGNOSIS — R194 Change in bowel habit: Secondary | ICD-10-CM | POA: Insufficient documentation

## 2015-11-18 DIAGNOSIS — G43909 Migraine, unspecified, not intractable, without status migrainosus: Secondary | ICD-10-CM | POA: Diagnosis not present

## 2015-11-18 DIAGNOSIS — Z86018 Personal history of other benign neoplasm: Secondary | ICD-10-CM | POA: Diagnosis not present

## 2015-11-18 DIAGNOSIS — F419 Anxiety disorder, unspecified: Secondary | ICD-10-CM | POA: Diagnosis not present

## 2015-11-18 DIAGNOSIS — Z923 Personal history of irradiation: Secondary | ICD-10-CM | POA: Diagnosis not present

## 2015-11-18 DIAGNOSIS — K219 Gastro-esophageal reflux disease without esophagitis: Secondary | ICD-10-CM | POA: Diagnosis not present

## 2015-11-18 DIAGNOSIS — R509 Fever, unspecified: Secondary | ICD-10-CM | POA: Diagnosis not present

## 2015-11-18 HISTORY — DX: Essential (primary) hypertension: I10

## 2015-11-18 LAB — COMPREHENSIVE METABOLIC PANEL
ALT: 15 U/L (ref 14–54)
ANION GAP: 11 (ref 5–15)
AST: 18 U/L (ref 15–41)
Albumin: 4 g/dL (ref 3.5–5.0)
Alkaline Phosphatase: 60 U/L (ref 38–126)
BUN: 11 mg/dL (ref 6–20)
CHLORIDE: 104 mmol/L (ref 101–111)
CO2: 26 mmol/L (ref 22–32)
Calcium: 9.6 mg/dL (ref 8.9–10.3)
Creatinine, Ser: 0.9 mg/dL (ref 0.44–1.00)
Glucose, Bld: 107 mg/dL — ABNORMAL HIGH (ref 65–99)
POTASSIUM: 4.3 mmol/L (ref 3.5–5.1)
Sodium: 141 mmol/L (ref 135–145)
Total Bilirubin: 0.4 mg/dL (ref 0.3–1.2)
Total Protein: 6.7 g/dL (ref 6.5–8.1)

## 2015-11-18 LAB — URINALYSIS, ROUTINE W REFLEX MICROSCOPIC
Bilirubin Urine: NEGATIVE
GLUCOSE, UA: NEGATIVE mg/dL
Hgb urine dipstick: NEGATIVE
Ketones, ur: NEGATIVE mg/dL
LEUKOCYTES UA: NEGATIVE
Nitrite: NEGATIVE
PH: 7 (ref 5.0–8.0)
Protein, ur: NEGATIVE mg/dL
SPECIFIC GRAVITY, URINE: 1.006 (ref 1.005–1.030)

## 2015-11-18 LAB — LIPASE, BLOOD: LIPASE: 31 U/L (ref 11–51)

## 2015-11-18 LAB — CBC
HEMATOCRIT: 41.4 % (ref 36.0–46.0)
HEMOGLOBIN: 13.5 g/dL (ref 12.0–15.0)
MCH: 29.4 pg (ref 26.0–34.0)
MCHC: 32.6 g/dL (ref 30.0–36.0)
MCV: 90.2 fL (ref 78.0–100.0)
Platelets: 301 10*3/uL (ref 150–400)
RBC: 4.59 MIL/uL (ref 3.87–5.11)
RDW: 13.2 % (ref 11.5–15.5)
WBC: 5.5 10*3/uL (ref 4.0–10.5)

## 2015-11-18 MED ORDER — IOPAMIDOL (ISOVUE-300) INJECTION 61%
INTRAVENOUS | Status: AC
Start: 2015-11-18 — End: 2015-11-18
  Administered 2015-11-18: 100 mL
  Filled 2015-11-18: qty 100

## 2015-11-18 NOTE — Discharge Instructions (Signed)
Your work up today does not show any emergent cause for your loose stools and intermittent abdominal pain.  Your labs are without acute abnormalities.  Urinalysis without signs of infection.  CT abdomen/pelvis without acute abnormalities.  It does show multiple small low-density structures in the liver likely represent cysts.  4 mm non-obstructing calculus in the right lower kidney.  Possible small hiatal hernia.  Please follow up with your primary care doctor.  Return to the ED if you experience sudden severe abdominal pain, bloody stools, or fever.   Abdominal Pain, Adult Many things can cause abdominal pain. Usually, abdominal pain is not caused by a disease and will improve without treatment. It can often be observed and treated at home. Your health care provider will do a physical exam and possibly order blood tests and X-rays to help determine the seriousness of your pain. However, in many cases, more time must pass before a clear cause of the pain can be found. Before that point, your health care provider may not know if you need more testing or further treatment. HOME CARE INSTRUCTIONS Monitor your abdominal pain for any changes. The following actions may help to alleviate any discomfort you are experiencing:  Only take over-the-counter or prescription medicines as directed by your health care provider.  Do not take laxatives unless directed to do so by your health care provider.  Try a clear liquid diet (broth, tea, or water) as directed by your health care provider. Slowly move to a bland diet as tolerated. SEEK MEDICAL CARE IF:  You have unexplained abdominal pain.  You have abdominal pain associated with nausea or diarrhea.  You have pain when you urinate or have a bowel movement.  You experience abdominal pain that wakes you in the night.  You have abdominal pain that is worsened or improved by eating food.  You have abdominal pain that is worsened with eating fatty  foods.  You have a fever. SEEK IMMEDIATE MEDICAL CARE IF:  Your pain does not go away within 2 hours.  You keep throwing up (vomiting).  Your pain is felt only in portions of the abdomen, such as the right side or the left lower portion of the abdomen.  You pass bloody or black tarry stools. MAKE SURE YOU:  Understand these instructions.  Will watch your condition.  Will get help right away if you are not doing well or get worse.   This information is not intended to replace advice given to you by your health care provider. Make sure you discuss any questions you have with your health care provider.   Document Released: 04/07/2005 Document Revised: 03/19/2015 Document Reviewed: 03/07/2013 Elsevier Interactive Patient Education [employer].

## 2015-11-18 NOTE — ED Provider Notes (Signed)
CSN: [id number]     Arrival date & time 11/18/15  1637 History  By signing my name below, I, Stephania Fragmin, attest that this documentation has been prepared under the direction and in the presence of Gloriann Loan, PA-C. Electronically Signed: Stephania Fragmin, ED Scribe. 11/18/2015. 6:42 PM.    Chief Complaint  Patient presents with  . Abdominal Pain   The history is provided by the patient. No language interpreter was used.    HPI Comments: Kylie Tucker is a 65 y.o. female who presents to the Emergency Department complaining of abdominal pain that began 5-6 weeks ago. She states she started having diarrhea for 3 weeks when she was started carvedilol in March. Per medical records, patient had switched from carvedilol to losartan due to side effects, and about 8 days ago, she was switched from losartan to Bystolic due to side effects from losartan. She states her abdominal pain has resolved, but diarrhea has turned to loose stools that she describes as "a soft mush." Patient reports subjective fever and chills, one episode of vomiting clear liquid when she bent over one day, and nausea. Patient reports she had seen her PCP about 4 days ago, had blood tests, and gave a stool sample. She reports her stool appeared to be "soft cow patty," and was malodorous. She denies any international travel or swimming in any ponds/lakes. Patient states she had cut her sodium out of her diet and has lost 13 pounds. She reports she takes Protonix for epigastric gurgling that she attributes to GERD. She notes she also takes probiotics. Last BM today.  She reports 1 BM/day.  She denies a history of any abdominal surgeries.  She denies CP, SOB, current abdominal pain, melena, hematochezia, hematemesis, urinary frequency, dysuria, or hematuria.    Past Medical History  Diagnosis Date  . Cellulitis and abscess of upper arm and forearm   . Postmastectomy lymphedema rt side  . History of right mastectomy 2004    chemo/ radiation   . Migraines   . Cancer ([place]) rt breast    2004/ chemo/ radiation  . Allergy   . Hyperlipidemia   . PONV (postoperative nausea and vomiting)     cannot take zofran  . GERD (gastroesophageal reflux disease)   . Sentinel node   . Anxiety   . Left bundle branch block 2016   Past Surgical History  Procedure Laterality Date  . Rt mastectomy  2004    lymph nodes-7-axillary node dissection  . Nasal sinus surgery  1990/2001  . Mandible surgery  1991  . Port-a-cath removal      in and now out  . Nasal sinus surgery    . Mouth surgery    . Orif finger fracture  02/14/2012    Procedure: OPEN REDUCTION INTERNAL FIXATION (ORIF) METACARPAL (FINGER) FRACTURE;  Surgeon: Wynonia Sours, MD;  Location: [facility];  Service: Orthopedics;  Laterality: Right;  RIGHT FIFTH   . Breast lumpectomy  2004     br bx  . Orif metacarpal fracture  8/13    rt   . Repair extensor tendon Left 05/30/2013    Procedure: RELEASE TRANSPOSITION EXTENSOR POLLICUS LONGUS LEFT WRIST;  Surgeon: Wynonia Sours, MD;  Location: [facility];  Service: Orthopedics;  Laterality: Left;  . Breast lumpectomy with axillary lymph node dissection    . Carpal tunnel release    . Wrist arthroscopy Left 2014  . Shoulder arthroscopy with rotator cuff repair and subacromial  decompression Left 11/22/2013    Procedure: LEFT SHOULDER ARTHROSCOPY WITH SUBACROMIAL DECOMPRESSION DISTAL CLAVICLE RESECTION REPAIR  ROTATOR CUFF AS NEEDED ;  Surgeon: Cammie Sickle., MD;  Location: [facility];  Service: Orthopedics;  Laterality: Left;  . Cataract extraction Left 07/12/2013   Family History  Problem Relation Age of Onset  . Heart disease Father   . Cancer Maternal Aunt     breast  . Heart disease Paternal Uncle   . Heart disease Paternal Grandmother   . Heart disease Paternal Grandfather   . Heart disease Mother   . Hypertension Mother   . Diabetes Brother    Social History  Substance Use Topics   . Smoking status: Never Smoker   . Smokeless tobacco: Never Used  . Alcohol Use: No   OB History    Gravida Para Term Preterm AB TAB SAB Ectopic Multiple Living   3 2        1      Review of Systems  Constitutional: Positive for fever (subjective) and chills.  Gastrointestinal: Positive for nausea, vomiting (one episode, clear liquid) and abdominal pain. Negative for blood in stool.  Genitourinary: Negative for dysuria and hematuria.  All other systems reviewed and are negative.     Allergies  Acetaminophen; Amoxicillin-pot clavulanate; Azithromycin; Cefuroxime axetil; Cephalexin; Hydrocodone-acetaminophen; Hydrocodone-acetaminophen; Levofloxacin; Morphine and related; Ondansetron; Propoxyphene n-acetaminophen; Rosuvastatin; Sulfonamide derivatives; Tramadol; Vioxx; Zofran; Clarithromycin; Codeine; and Sulfamethoxazole  Home Medications   Prior to Admission medications   Medication Sig Start Date End Date Taking? Authorizing Provider  ALPRAZOLAM XR 2 MG 24 hr tablet Take 2 mg by mouth 2 (two) times daily. 09/27/15   Historical Provider, MD  calcium carbonate (OS-CAL) 600 MG TABS Take 600 mg by mouth 2 (two) times daily with a meal.      Historical Provider, MD  Cholecalciferol (VITAMIN D) 2000 UNITS CAPS Take 1 capsule by mouth daily.     Historical Provider, MD  eletriptan (RELPAX) 40 MG tablet Take 40 mg by mouth as needed for migraine or headache. Reported on 10/20/2015    Historical Provider, MD  ezetimibe (ZETIA) 10 MG tablet Take 10 mg by mouth at bedtime.     Historical Provider, MD  Multiple Vitamins-Minerals (MULTIVITAMINS THER. W/MINERALS) TABS Take 1 tablet by mouth daily.      Historical Provider, MD  nebivolol (BYSTOLIC) 5 MG tablet Take 1 tablet (5 mg total) by mouth daily. 11/11/15   Thayer Headings, MD  Olopatadine HCl (PATADAY) 0.2 % SOLN Place 1 drop into both eyes as needed.     Historical Provider, MD  pantoprazole (PROTONIX) 40 MG tablet Take 40 mg by mouth daily as  needed (for GERD).     Historical Provider, MD  Probiotic Product (PROBIOTIC DAILY PO) Take by mouth daily.     Historical Provider, MD  promethazine (PHENERGAN) 25 MG tablet Take 25 mg by mouth every 8 (eight) hours as needed for nausea or vomiting. Reported on 10/20/2015 07/30/13   Historical Provider, MD   BP 165/84 mmHg  Pulse 67  Temp(Src) 98.3 F (36.8 C) (Oral)  Resp 16  SpO2 100%  LMP 01/10/2003 Physical Exam  Constitutional: She is oriented to person, place, and time. She appears well-developed and well-nourished.  Non-toxic appearance. She does not have a sickly appearance. She does not appear ill. No distress.  HENT:  Head: Normocephalic and atraumatic.  Mouth/Throat: Oropharynx is clear and moist.  Eyes: Conjunctivae and EOM are normal. Pupils are  equal, round, and reactive to light.  Neck: Normal range of motion. Neck supple. No tracheal deviation present.  Cardiovascular: Normal rate, regular rhythm and normal heart sounds.   No murmur heard. Pulmonary/Chest: Effort normal and breath sounds normal. No accessory muscle usage or stridor. No respiratory distress. She has no wheezes. She has no rhonchi. She has no rales.  Abdominal: Soft. Bowel sounds are normal. She exhibits no distension. There is no tenderness. There is no rebound and no guarding.  Musculoskeletal: Normal range of motion.  Lymphadenopathy:    She has no cervical adenopathy.  Neurological: She is alert and oriented to person, place, and time.  Speech clear without dysarthria.  Skin: Skin is warm and dry.  Psychiatric: She has a normal mood and affect. Her behavior is normal.  Nursing note and vitals reviewed.   ED Course  Procedures (including critical care time)  DIAGNOSTIC STUDIES: Oxygen Saturation is 100% on RA, normal by my interpretation.    COORDINATION OF CARE: 6:36 PM - Discussed treatment plan with pt at bedside which includes CT abdomen and pelvis. Pt verbalized understanding and agreed to  plan.   Labs Review Labs Reviewed  COMPREHENSIVE METABOLIC PANEL - Abnormal; Notable for the following:    Glucose, Bld 107 (*)    All other components within normal limits  LIPASE, BLOOD  CBC  URINALYSIS, ROUTINE W REFLEX MICROSCOPIC (NOT AT [facility])    Imaging Review No results found. I have personally reviewed and evaluated these images and lab results as part of my medical decision-making.   EKG Interpretation None      MDM   Final diagnoses:  Abdominal pain, unspecified abdominal location  Change in bowel habits   Patient presents with abdominal pain and softer stools than her normal.  No melena, hematochezia, or hematemesis.  Has had recent changes to cardiac medications due to intolerance.  Has been seen by PCP for this and recently had lab work and stool samples obtained this past week.  No recent travel to endemic areas, hx of abdominal surgeries, recent abx use, or swimming in pond/lake water.  No pain or complaints presently.  VSS, NAD.  Patient appears non-toxic or septic appearing.  On exam, heart RRR, lungs CTAB, abdomen soft and benign.  No rebound, guarding, or rigidity.  Doubt surgical abdomen.  DDx includes GERD, PUD, gastritis, malignancy, diverticulitis, IBD, medication intolerance.  Doubt mesenteric ischemia, perforated bowel, AAA, SBO.  Labs without acute abnormalities.  UA without signs of infection.  CT abdomen/pelvis without acute abnormalities.  It does show multiple small low-density structures in the liver likely represent cysts. 4 mm non-obstructing calculus in the right lower kidney.  Possible small hiatal hernia.  Patient has remained symptom free throughout her ED stay.  Evaluation does not show pathology requiring ongoing emergent intervention or admission. Pt is hemodynamically stable and mentating appropriately. Discussed findings/results and plan with patient/guardian, who agrees with plan. All questions answered. Return precautions discussed.  Patient  has good follow up with PCP.  I personally performed the services described in this documentation, which was scribed in my presence. The recorded information has been reviewed and is accurate.      Gloriann Loan, PA-C 11/18/15 [id number]  Daleen Bo, MD 11/18/15 [phone]

## 2015-11-18 NOTE — ED Notes (Signed)
Pt transported to CT ?

## 2015-11-18 NOTE — ED Notes (Addendum)
Pt here with abdominal pain that started 5-6 weeks ago. She states she started having diarrhea in March after starting her carvedilol. The diarrhea has subsided and turned into loose stools. Denies current abd pain.

## 2015-11-24 ENCOUNTER — Telehealth: Payer: Self-pay | Admitting: Cardiovascular Disease

## 2015-11-24 NOTE — Telephone Encounter (Signed)
New message  Pt c/o medication issue:  1. Name of Medication: nebivolol (BYSTOLIC) 5 MG tablet   4. What is your medication issue?  Pt called states that she found out that since she was placed on Several BP medications. She was having problems, but she  found that she had a bacterial infection and wanted a call back to discuss if the bacteria infection that she picked up has some "play" on the Bp medication. Being treated for Bacteria but will that interfere with her BP medications or her BP in general. Please call back to discuss that as well as retrieving samples of bystolic

## 2015-11-25 MED ORDER — NEBIVOLOL HCL 5 MG PO TABS: 5.0000 mg | ORAL_TABLET | Freq: Every day | ORAL | Status: DC

## 2015-11-25 NOTE — Telephone Encounter (Signed)
Spoke with patient who states she has been under stress over a gastrointestinal issue and wonders if this has caused BP to be elevated.  I advised her to continue to keep a log and to call back to report changes.  She states she is about to run out of the samples I gave her.  I advised her to pick up a Rx for #30 and to take those over the next few weeks and log BP.  She verbalized understanding and agreement to call back if her BP improves and she does not feel that the medication is needed.

## 2015-11-25 NOTE — Telephone Encounter (Signed)
Follow Up  Pt is following up on call from yesterday. Request a call back to discuss.

## 2015-12-05 ENCOUNTER — Telehealth: Payer: Self-pay | Admitting: Obstetrics and Gynecology

## 2015-12-05 ENCOUNTER — Ambulatory Visit (INDEPENDENT_AMBULATORY_CARE_PROVIDER_SITE_OTHER): Payer: Commercial Managed Care - HMO | Admitting: Obstetrics and Gynecology

## 2015-12-05 ENCOUNTER — Encounter: Payer: Self-pay | Admitting: Obstetrics and Gynecology

## 2015-12-05 VITALS — BP 142/82 | HR 70 | Ht 62.5 in | Wt 122.4 lb

## 2015-12-05 DIAGNOSIS — F411 Generalized anxiety disorder: Secondary | ICD-10-CM

## 2015-12-05 DIAGNOSIS — G4700 Insomnia, unspecified: Secondary | ICD-10-CM

## 2015-12-05 DIAGNOSIS — N951 Menopausal and female climacteric states: Secondary | ICD-10-CM | POA: Diagnosis not present

## 2015-12-05 DIAGNOSIS — F32A Depression, unspecified: Secondary | ICD-10-CM

## 2015-12-05 DIAGNOSIS — F329 Major depressive disorder, single episode, unspecified: Secondary | ICD-10-CM

## 2015-12-05 MED ORDER — EFFEXOR XR 37.5 MG PO CP24: 37.5000 mg | ORAL_CAPSULE | Freq: Every day | ORAL | Status: DC

## 2015-12-05 NOTE — Telephone Encounter (Signed)
Return call to patient. She has stopped her Effexor when felt fatigue and palpitations. Has had recent treatment for H-Pylori and change to BP medications by cardiology to [facility]. She states that she is feeling well on Bystolic and HTN is in control. She states she has been having increased hot flashes that are just "unbearable" and she states she is having trouble with sleep and reports that she feels like she is having depression symptoms. Denies SI/HI.  She states her depression symptoms make her feels like she doesn't want to get out of bed and states "I just don't feel right." She is requesting medication for hot flashes.   Advised with patient history and her requests, office visit recommended. Scheduled today with Dr. Quincy Simmonds at 1300 and patient agreeable.  Routing to provider for final review. Patient agreeable to disposition. Will close encounter.

## 2015-12-05 NOTE — Progress Notes (Signed)
Patient ID: Kylie Tucker, female   DOB: [birth year], 65 y.o.   MRN: [id number] GYNECOLOGY  VISIT   HPI: 65 y.o.   Married  Caucasian  female   G3P2 with Patient's last menstrual period was 01/10/2003.   here for menopausal symptoms; hot flashes, insomnia, crying spells and depression.  Diagnosed with LBBB.  Dr. Cathie Olden. Took Coreg and had diarrhea.  Switched to Losartan.  Felt like her heart was racing.  Returned to Dr. Cathie Olden and adjusted the dosage.  Had fluctuations in blood pressure.  Switched now to [employer].  Saw her PCP and was dx with H Pylori. Had a negative stool specimen.  Treated with Amoxicillin and Metronidazol, and just finished this.   Stopped Effexor when she was first placed on the Coreg. Taking Xanax 2 mg at hs for sleep. Feeling scared and like she does not want to go anywhere. Sadness.  Not sleeping well.  Chronic insomnia. Feels a lump in her throat and has decreased appetite.  Denies suicidal ideation or homicidal ideation.   PCP - Dr. Gerarda Fraction.   GYNECOLOGIC HISTORY: Patient's last menstrual period was 01/10/2003. Contraception:  Postmenopausal Menopausal hormone therapy:  none Last mammogram:  04-04-15 Unilateral Lt./Density C/Rt.mastectomy/Neg/BiRads1:[facility] Last pap smear:   09-26-15 Neg:Neg HR HPV        OB History    Gravida Para Term Preterm AB TAB SAB Ectopic Multiple Living   3 2        1          Patient Active Problem List   Diagnosis Date Noted  . Chest pain 09/23/2015  . Abnormal vision 06/13/2015  . Anxiety disorder 06/13/2015  . Cataract cortical, senile 06/13/2015  . Clinical depression 06/13/2015  . Cephalalgia 06/13/2015  . H/O malignant neoplasm of breast 06/13/2015  . 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 Sinus infection 06/13/2015  . Long term current use of systemic steroids 06/13/2015  . Chronic systolic CHF (congestive heart failure), NYHA class 1 ([place]) 05/09/2015  . LBBB (left bundle branch block) 03/26/2015  .  Lymphedema of arm 12/13/2013  . Complete rupture of rotator cuff 11/22/2013  . Nipple discharge in female 12/01/2012  . History of breast cancer 11/30/2012  . Epiphora 11/28/2012  . Disorder of endocrine system 04/25/2012  . Ceratitis 04/25/2012  . Blepharitis 04/25/2012  . Breast cancer, right breast ([place])   . FATIGUE 10/01/2009  . 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 Dyspnea 10/01/2009  . HYPERLIPIDEMIA 09/30/2009    Past Medical History  Diagnosis Date  . Cellulitis and abscess of upper arm and forearm   . Postmastectomy lymphedema rt side  . History of right mastectomy 2004    chemo/ radiation  . Migraines   . Cancer ([place]) rt breast    2004/ chemo/ radiation  . Allergy   . Hyperlipidemia   . PONV (postoperative nausea and vomiting)     cannot take zofran  . GERD (gastroesophageal reflux disease)   . Sentinel node   . Anxiety   . Left bundle branch block 2016  . Hypertension   . H/O Helicobacter infection 2017    Past Surgical History  Procedure Laterality Date  . Rt mastectomy  2004    lymph nodes-7-axillary node dissection  . Nasal sinus surgery  1990/2001  . Mandible surgery  1991  . Port-a-cath removal      in and now out  . Nasal sinus surgery    . Mouth surgery    . Orif finger fracture  02/14/2012    Procedure: OPEN REDUCTION INTERNAL FIXATION (ORIF) METACARPAL (FINGER) FRACTURE;  Surgeon: Wynonia Sours, MD;  Location: [place];  Service: Orthopedics;  Laterality: Right;  RIGHT FIFTH   . Breast lumpectomy  2004     br bx  . Orif metacarpal fracture  8/13    rt   . Repair extensor tendon Left 05/30/2013    Procedure: RELEASE TRANSPOSITION EXTENSOR POLLICUS LONGUS LEFT WRIST;  Surgeon: Wynonia Sours, MD;  Location: [place];  Service: Orthopedics;  Laterality: Left;  . Breast lumpectomy with axillary lymph node dissection    . Carpal tunnel release    . Wrist arthroscopy Left 2014  . Shoulder arthroscopy with rotator cuff repair  and subacromial decompression Left 11/22/2013    Procedure: LEFT SHOULDER ARTHROSCOPY WITH SUBACROMIAL DECOMPRESSION DISTAL CLAVICLE RESECTION REPAIR  ROTATOR CUFF AS NEEDED ;  Surgeon: Cammie Sickle., MD;  Location: [facility];  Service: Orthopedics;  Laterality: Left;  . Cataract extraction Left 07/12/2013    Current Outpatient Prescriptions  Medication Sig Dispense Refill  . ALPRAZOLAM XR 2 MG 24 hr tablet Take 2 mg by mouth 2 (two) times daily.    . calcium carbonate (OS-CAL) 600 MG TABS Take 600 mg by mouth 2 (two) times daily with a meal.      . Cholecalciferol (VITAMIN D) 2000 UNITS CAPS Take 1 capsule by mouth daily.     Marland Kitchen dicyclomine (BENTYL) 10 MG capsule Take 1 capsule by mouth as needed.    . eletriptan (RELPAX) 40 MG tablet Take 40 mg by mouth as needed for migraine or headache. Reported on 10/20/2015    . ezetimibe (ZETIA) 10 MG tablet Take 10 mg by mouth at bedtime.     . Multiple Vitamins-Minerals (MULTIVITAMINS THER. W/MINERALS) TABS Take 1 tablet by mouth daily.      . nebivolol (BYSTOLIC) 5 MG tablet Take 1 tablet (5 mg total) by mouth daily. 30 tablet 11  . Olopatadine HCl (PATADAY) 0.2 % SOLN Place 1 drop into both eyes as needed.     Marland Kitchen omeprazole (PRILOSEC) 40 MG capsule     . Probiotic Product (PROBIOTIC DAILY PO) Take by mouth daily.     . promethazine (PHENERGAN) 25 MG tablet Take 25 mg by mouth every 8 (eight) hours as needed for nausea or vomiting. Reported on 10/20/2015    . amoxicillin (AMOXIL) 500 MG capsule Reported on 12/05/2015    . EFFEXOR XR 37.5 MG 24 hr capsule Take 1 capsule (37.5 mg total) by mouth daily with breakfast. Reported on 12/05/2015 30 capsule 10  . metroNIDAZOLE (FLAGYL) 250 MG tablet Reported on 12/05/2015     No current facility-administered medications for this visit.     ALLERGIES: Acetaminophen; Amoxicillin-pot clavulanate; Azithromycin; Cefuroxime axetil; Cephalexin; Hydrocodone-acetaminophen; Hydrocodone-acetaminophen;  Levofloxacin; Morphine and related; Ondansetron; Propoxyphene n-acetaminophen; Rosuvastatin; Sulfonamide derivatives; Tramadol; Vioxx; Zofran; Clarithromycin; Codeine; and Sulfamethoxazole  Family History  Problem Relation Age of Onset  . Heart disease Father   . Cancer Maternal Aunt     breast  . Heart disease Paternal Uncle   . Heart disease Paternal Grandmother   . Heart disease Paternal Grandfather   . Heart disease Mother   . Hypertension Mother   . Diabetes Brother     Social History   Social History  . Marital Status: Married    Spouse Name: N/A  . Number of Children: N/A  . Years of Education: N/A  Occupational History  . Not on file.   Social History Main Topics  . Smoking status: Never Smoker   . Smokeless tobacco: Never Used  . Alcohol Use: No  . Drug Use: No  . Sexual Activity:    Partners: Male    Birth Control/ Protection: Post-menopausal   Other Topics Concern  . Not on file   Social History Narrative    ROS:  Pertinent items are noted in HPI.  PHYSICAL EXAMINATION:    BP 142/82 mmHg  Pulse 70  Ht 5' 2.5" (1.588 m)  Wt 122 lb 6.4 oz (55.52 kg)  BMI 22.02 kg/m2  LMP 01/10/2003    General appearance: alert, cooperative and appears stated age   Chaperone was present for exam.  ASSESSMENT  Depression.  Anxiety.  Insomnia.  Menopausal symptoms. Recent abrupt discontinuation of Effexor XR.  PLAN  Restart Effexor XR 37.5 mg.  I did a med check and this is ok to use with the Bystolic.  Referred to [place] counseling group.  Will refer to Dr. Sheralyn Boatman.  Will have patient return for a recheck appointment in 6 weeks.  States she can reach out to her husband if she is feeling sad or overwhelmed.  An After Visit Summary was printed and given to the patient.  ___25___ minutes face to face time of which over 50% was spent in counseling.

## 2015-12-05 NOTE — Telephone Encounter (Signed)
Patient is taking nebivolol (BYSTOLIC) 5 MG tablet and has stopped taking Effexor. Patient is asking to change the Effexor to "something non hormonal".

## 2015-12-07 ENCOUNTER — Encounter (HOSPITAL_COMMUNITY): Payer: Self-pay | Admitting: Emergency Medicine

## 2015-12-07 ENCOUNTER — Emergency Department (HOSPITAL_COMMUNITY): Payer: Commercial Managed Care - HMO

## 2015-12-07 ENCOUNTER — Emergency Department (HOSPITAL_COMMUNITY)
Admission: EM | Admit: 2015-12-07 | Discharge: 2015-12-07 | Disposition: A | Discharge status: Home / Self Care | Payer: Commercial Managed Care - HMO | Attending: Emergency Medicine | Admitting: Emergency Medicine

## 2015-12-07 DIAGNOSIS — I10 Essential (primary) hypertension: Secondary | ICD-10-CM | POA: Diagnosis not present

## 2015-12-07 DIAGNOSIS — R1011 Right upper quadrant pain: Secondary | ICD-10-CM | POA: Diagnosis not present

## 2015-12-07 DIAGNOSIS — R112 Nausea with vomiting, unspecified: Secondary | ICD-10-CM | POA: Diagnosis not present

## 2015-12-07 DIAGNOSIS — E785 Hyperlipidemia, unspecified: Secondary | ICD-10-CM | POA: Diagnosis not present

## 2015-12-07 DIAGNOSIS — R1010 Upper abdominal pain, unspecified: Secondary | ICD-10-CM

## 2015-12-07 DIAGNOSIS — Z859 Personal history of malignant neoplasm, unspecified: Secondary | ICD-10-CM | POA: Insufficient documentation

## 2015-12-07 LAB — CBC
HEMATOCRIT: 40.8 % (ref 36.0–46.0)
HEMOGLOBIN: 13.4 g/dL (ref 12.0–15.0)
MCH: 29.7 pg (ref 26.0–34.0)
MCHC: 32.8 g/dL (ref 30.0–36.0)
MCV: 90.5 fL (ref 78.0–100.0)
Platelets: 280 10*3/uL (ref 150–400)
RBC: 4.51 MIL/uL (ref 3.87–5.11)
RDW: 13.7 % (ref 11.5–15.5)
WBC: 7.1 10*3/uL (ref 4.0–10.5)

## 2015-12-07 LAB — COMPREHENSIVE METABOLIC PANEL
ALBUMIN: 4 g/dL (ref 3.5–5.0)
ALT: 22 U/L (ref 14–54)
ANION GAP: 7 (ref 5–15)
AST: 22 U/L (ref 15–41)
Alkaline Phosphatase: 43 U/L (ref 38–126)
BILIRUBIN TOTAL: 0.5 mg/dL (ref 0.3–1.2)
BUN: 11 mg/dL (ref 6–20)
CO2: 29 mmol/L (ref 22–32)
Calcium: 9.3 mg/dL (ref 8.9–10.3)
Chloride: 102 mmol/L (ref 101–111)
Creatinine, Ser: 1 mg/dL (ref 0.44–1.00)
GFR calc non Af Amer: 58 mL/min — ABNORMAL LOW (ref >60)
GLUCOSE: 110 mg/dL — AB (ref 65–99)
POTASSIUM: 4.3 mmol/L (ref 3.5–5.1)
Sodium: 138 mmol/L (ref 135–145)
TOTAL PROTEIN: 6.5 g/dL (ref 6.5–8.1)

## 2015-12-07 LAB — URINE MICROSCOPIC-ADD ON

## 2015-12-07 LAB — URINALYSIS, ROUTINE W REFLEX MICROSCOPIC
Glucose, UA: NEGATIVE mg/dL
Hgb urine dipstick: NEGATIVE
Ketones, ur: NEGATIVE mg/dL
NITRITE: NEGATIVE
PH: 5.5 (ref 5.0–8.0)
Protein, ur: NEGATIVE mg/dL
SPECIFIC GRAVITY, URINE: 1.024 (ref 1.005–1.030)

## 2015-12-07 LAB — LIPASE, BLOOD: Lipase: 30 U/L (ref 11–51)

## 2015-12-07 MED ORDER — METOCLOPRAMIDE HCL 10 MG PO TABS
10.0000 mg | ORAL_TABLET | Freq: Three times a day (TID) | ORAL | Status: DC | PRN
Start: 2015-12-07 — End: 2015-12-18

## 2015-12-07 MED ORDER — TRAMADOL HCL 50 MG PO TABS: 50.0000 mg | ORAL_TABLET | Freq: Four times a day (QID) | ORAL | Status: DC | PRN

## 2015-12-07 MED ORDER — FENTANYL CITRATE (PF) 100 MCG/2ML IJ SOLN
100.0000 ug | Freq: Once | INTRAMUSCULAR | Status: AC
  Administered 2015-12-07: 100 ug via INTRAVENOUS
  Filled 2015-12-07: qty 2

## 2015-12-07 MED ORDER — METOCLOPRAMIDE HCL 5 MG/ML IJ SOLN
10.0000 mg | Freq: Once | INTRAMUSCULAR | Status: AC
  Administered 2015-12-07: 10 mg via INTRAVENOUS
  Filled 2015-12-07: qty 2

## 2015-12-07 MED ORDER — DIATRIZOATE MEGLUMINE & SODIUM 66-10 % PO SOLN
15.0000 mL | Freq: Once | ORAL | Status: AC
Start: 2015-12-07 — End: 2015-12-07
  Administered 2015-12-07: 15 mL via ORAL

## 2015-12-07 MED ORDER — PROMETHAZINE HCL 25 MG PO TABS: 25.0000 mg | ORAL_TABLET | Freq: Four times a day (QID) | ORAL | Status: DC | PRN

## 2015-12-07 MED ORDER — SODIUM CHLORIDE 0.9 % IV SOLN
1000.0000 mL | Freq: Once | INTRAVENOUS | Status: AC
Start: 2015-12-07 — End: 2015-12-07
  Administered 2015-12-07: 1000 mL via INTRAVENOUS

## 2015-12-07 MED ORDER — IOPAMIDOL (ISOVUE-300) INJECTION 61%
100.0000 mL | Freq: Once | INTRAVENOUS | Status: AC | PRN
  Administered 2015-12-07: 100 mL via INTRAVENOUS

## 2015-12-07 MED ORDER — SODIUM CHLORIDE 0.9 % IV SOLN
1000.0000 mL | INTRAVENOUS | Status: DC
  Administered 2015-12-07: 1000 mL via INTRAVENOUS

## 2015-12-07 NOTE — ED Notes (Signed)
Pt reports abd pain , diarrhea x 2 months. Seen by PCP , Dx with gastric ulcere per pt. She sts also that prescribed meds not helping. Also reports nausea. denies GI bleed .

## 2015-12-07 NOTE — Discharge Instructions (Signed)

## 2015-12-07 NOTE — ED Provider Notes (Signed)
CSN: [id number]     Arrival date & time 12/07/15  [id number] History   First MD Initiated Contact with Patient 12/07/15 [phone]     Chief Complaint  Patient presents with  . Abdominal Pain      The history is provided by the patient and medical records.  Patient presents the emergency department with ongoing abdominal discomfort and diarrhea over the past 2 months.  She's been seen by gastroenterology as well as her primary care physician no clear etiology is found.  She is currently being treated for suspected gastric ulcer.  She has not had endoscopy.  She stated that her GI doctor did not think she would benefit from endoscopy for the patient.  She reports the pain is located in her upper abdomen and the diarrhea is watery.  No frank blood noted.  No fevers or chills.  Mild decreased oral intake  Past Medical History  Diagnosis Date  . Cellulitis and abscess of upper arm and forearm   . Postmastectomy lymphedema rt side  . History of right mastectomy 2004    chemo/ radiation  . Migraines   . Cancer ([place]) rt breast    2004/ chemo/ radiation  . Allergy   . Hyperlipidemia   . PONV (postoperative nausea and vomiting)     cannot take zofran  . GERD (gastroesophageal reflux disease)   . Sentinel node   . Anxiety   . Left bundle branch block 2016  . Hypertension   . H/O Helicobacter infection 2017   Past Surgical History  Procedure Laterality Date  . Rt mastectomy  2004    lymph nodes-7-axillary node dissection  . Nasal sinus surgery  1990/2001  . Mandible surgery  1991  . Port-a-cath removal      in and now out  . Nasal sinus surgery    . Mouth surgery    . Orif finger fracture  02/14/2012    Procedure: OPEN REDUCTION INTERNAL FIXATION (ORIF) METACARPAL (FINGER) FRACTURE;  Surgeon: Wynonia Sours, MD;  Location: [place];  Service: Orthopedics;  Laterality: Right;  RIGHT FIFTH   . Breast lumpectomy  2004     br bx  . Orif metacarpal fracture  8/13    rt   . Repair  extensor tendon Left 05/30/2013    Procedure: RELEASE TRANSPOSITION EXTENSOR POLLICUS LONGUS LEFT WRIST;  Surgeon: Wynonia Sours, MD;  Location: [place];  Service: Orthopedics;  Laterality: Left;  . Breast lumpectomy with axillary lymph node dissection    . Carpal tunnel release    . Wrist arthroscopy Left 2014  . Shoulder arthroscopy with rotator cuff repair and subacromial decompression Left 11/22/2013    Procedure: LEFT SHOULDER ARTHROSCOPY WITH SUBACROMIAL DECOMPRESSION DISTAL CLAVICLE RESECTION REPAIR  ROTATOR CUFF AS NEEDED ;  Surgeon: Cammie Sickle., MD;  Location: [place];  Service: Orthopedics;  Laterality: Left;  . Cataract extraction Left 07/12/2013   Family History  Problem Relation Age of Onset  . Heart disease Father   . Cancer Maternal Aunt     breast  . Heart disease Paternal Uncle   . Heart disease Paternal Grandmother   . Heart disease Paternal Grandfather   . Heart disease Mother   . Hypertension Mother   . Diabetes Brother    Social History  Substance Use Topics  . Smoking status: Never Smoker   . Smokeless tobacco: Never Used  . Alcohol Use: No   OB History  Gravida Para Term Preterm AB TAB SAB Ectopic Multiple Living   3 2        1      Review of Systems  All other systems reviewed and are negative.     Allergies  Acetaminophen; Amoxicillin-pot clavulanate; Azithromycin; Cefuroxime axetil; Cephalexin; Hydrocodone-acetaminophen; Hydrocodone-acetaminophen; Levofloxacin; Morphine and related; Ondansetron; Propoxyphene n-acetaminophen; Rosuvastatin; Sulfonamide derivatives; Tramadol; Vioxx; Zofran; Clarithromycin; Codeine; and Sulfamethoxazole  Home Medications   Prior to Admission medications   Medication Sig Start Date End Date Taking? Authorizing Provider  ALPRAZOLAM XR 2 MG 24 hr tablet Take 2 mg by mouth 2 (two) times daily. 09/27/15   Historical Provider, MD  amoxicillin (AMOXIL) 500 MG capsule Reported on  12/05/2015 11/19/15   Historical Provider, MD  calcium carbonate (OS-CAL) 600 MG TABS Take 600 mg by mouth 2 (two) times daily with a meal.      Historical Provider, MD  Cholecalciferol (VITAMIN D) 2000 UNITS CAPS Take 1 capsule by mouth daily.     Historical Provider, MD  dicyclomine (BENTYL) 10 MG capsule Take 1 capsule by mouth as needed. 11/14/15   Historical Provider, MD  EFFEXOR XR 37.5 MG 24 hr capsule Take 1 capsule (37.5 mg total) by mouth daily with breakfast. Reported on 12/05/2015 12/05/15   Nunzio Cobbs, MD  eletriptan (RELPAX) 40 MG tablet Take 40 mg by mouth as needed for migraine or headache. Reported on 10/20/2015    Historical Provider, MD  ezetimibe (ZETIA) 10 MG tablet Take 10 mg by mouth at bedtime.     Historical Provider, MD  metroNIDAZOLE (FLAGYL) 250 MG tablet Reported on 12/05/2015 11/19/15   Historical Provider, MD  Multiple Vitamins-Minerals (MULTIVITAMINS THER. W/MINERALS) TABS Take 1 tablet by mouth daily.      Historical Provider, MD  nebivolol (BYSTOLIC) 5 MG tablet Take 1 tablet (5 mg total) by mouth daily. 11/25/15   Thayer Headings, MD  Olopatadine HCl (PATADAY) 0.2 % SOLN Place 1 drop into both eyes as needed.     Historical Provider, MD  omeprazole (PRILOSEC) 40 MG capsule  11/19/15   Historical Provider, MD  Probiotic Product (PROBIOTIC DAILY PO) Take by mouth daily.     Historical Provider, MD  promethazine (PHENERGAN) 25 MG tablet Take 25 mg by mouth every 8 (eight) hours as needed for nausea or vomiting. Reported on 10/20/2015 07/30/13   Historical Provider, MD   BP 144/55 mmHg  Pulse 58  Temp(Src) 97.4 F (36.3 C) (Oral)  Resp 14  SpO2 94%  LMP 01/10/2003 Physical Exam  Constitutional: She is oriented to person, place, and time. She appears well-developed and well-nourished. No distress.  HENT:  Head: Normocephalic and atraumatic.  Eyes: EOM are normal.  Neck: Normal range of motion.  Cardiovascular: Normal rate, regular rhythm and normal heart  sounds.   Pulmonary/Chest: Effort normal and breath sounds normal.  Abdominal: Soft. She exhibits no distension.  Mild generalized upper abdominal tenderness without guarding or rebound  Musculoskeletal: Normal range of motion.  Neurological: She is alert and oriented to person, place, and time.  Skin: Skin is warm and dry.  Psychiatric: She has a normal mood and affect. Judgment normal.  Nursing note and vitals reviewed.   ED Course  Procedures (including critical care time) Labs Review Labs Reviewed  COMPREHENSIVE METABOLIC PANEL - Abnormal; Notable for the following:    Glucose, Bld 110 (*)    GFR calc non Af Amer 58 (*)    All other components within normal  limits  URINALYSIS, ROUTINE W REFLEX MICROSCOPIC (NOT AT [facility]) - Abnormal; Notable for the following:    Color, Urine AMBER (*)    APPearance CLOUDY (*)    Bilirubin Urine SMALL (*)    Leukocytes, UA SMALL (*)    All other components within normal limits  URINE MICROSCOPIC-ADD ON - Abnormal; Notable for the following:    Squamous Epithelial / LPF 0-5 (*)    Bacteria, UA RARE (*)    Casts HYALINE CASTS (*)    Crystals CA OXALATE CRYSTALS (*)    All other components within normal limits  LIPASE, BLOOD  CBC    Imaging Review US Abdomen Complete  12/07/2015  CLINICAL DATA:  Patient with right upper quadrant pain for 2 weeks. EXAM: ABDOMEN ULTRASOUND COMPLETE COMPARISON:  CT abdomen 11/18/2015. FINDINGS: Gallbladder: No gallstones or wall thickening visualized. No sonographic Murphy sign noted by sonographer. Common bile duct: Diameter: 4 mm Liver: Within the left hepatic lobe there is a 1.1 x 1.3 cm cyst. There is an additional 1.1 cm cyst within the right hepatic lobe. IVC: No abnormality visualized. Pancreas: Visualized portion unremarkable. Spleen: Size and appearance within normal limits. Right Kidney: Length: 9.9 cm. Echogenicity within normal limits. No mass or hydronephrosis visualized. Left Kidney: Length: 10.2 cm.  Echogenicity within normal limits. No mass or hydronephrosis visualized. Abdominal aorta: No aneurysm visualized. Other findings: None. IMPRESSION: No cholelithiasis or sonographic evidence for acute cholecystitis. Electronically Signed   By: Lovey Newcomer M.D.   On: 12/07/2015 11:54   Dg Abd 2 Views  12/07/2015  CLINICAL DATA:  Patient with history of abdominal pain and diarrhea for 2 months. EXAM: ABDOMEN - 2 VIEW COMPARISON:  CT 11/18/2015. FINDINGS: Stool is demonstrated within the cecum and ascending colon. Gas is present within nondilated loops of large and small bowel in a nonobstructed pattern. No evidence for pneumatosis, portal venous gas or free intraperitoneal air. Lung bases are clear. IMPRESSION: Nonobstructed bowel gas pattern. Stool within the cecum and ascending colon. Electronically Signed   By: Lovey Newcomer M.D.   On: 12/07/2015 10:08   I have personally reviewed and evaluated these images and lab results as part of my medical decision-making.   EKG Interpretation None      MDM   Final diagnoses:  Upper abdominal pain  Nausea & vomiting    CT imaging and focal right upper quadrant ultrasound without acute pathology.  Patient be referred back to her primary care physician and GI.  I think she may benefit from endoscopy.    Jola Schmidt, MD 12/10/15 [phone]

## 2015-12-10 ENCOUNTER — Telehealth: Payer: Self-pay | Admitting: Obstetrics and Gynecology

## 2015-12-10 NOTE — Telephone Encounter (Signed)
OK to take the Effexor XR at night. You may close the encounter.

## 2015-12-10 NOTE — Telephone Encounter (Signed)
Called patient regarding referral to psychiatry. Patient aware psychiatry office requests patient to call and schedule appointment.  During call, patient requested message sent to nurse regarding medication. Patient states she is taking effexor extended release each day at lunch time. She states she wakes up every morning around 2am with hot flashes until she is "soaking wet" and experiences insomnia with it. Patient agreeable to return call from nurse to advise.

## 2015-12-10 NOTE — Telephone Encounter (Signed)
Spoke with patient. Patient states that she has been taking her Effexor XR 37.5 mg daily mid-morning. Last night she reports she woke up at 2 am and was experiencing increased hot flashes and was unable to go back to sleep. "The hot flashes were terrible. I just cannot do that every night." Reports last time she was taking Effexor she took it at night and did not have any problems. Asking if she may start taking Effexor XR again after dinner. Advised she may switch to taking Effexor XR after dinner as she has done well taking at this time before. Advised if she has any trouble with taking rx at night she will need to contact the office. Advised I will send a message to [facility] regarding switch and return call with any further recommendations. She is agreeable.

## 2015-12-16 ENCOUNTER — Emergency Department (HOSPITAL_COMMUNITY)
Admission: EM | Admit: 2015-12-16 | Discharge: 2015-12-16 | Disposition: A | Discharge status: Home / Self Care | Payer: Commercial Managed Care - HMO | Attending: Emergency Medicine | Admitting: Emergency Medicine

## 2015-12-16 ENCOUNTER — Encounter (HOSPITAL_COMMUNITY): Payer: Self-pay | Admitting: Emergency Medicine

## 2015-12-16 DIAGNOSIS — I10 Essential (primary) hypertension: Secondary | ICD-10-CM | POA: Diagnosis not present

## 2015-12-16 DIAGNOSIS — G4700 Insomnia, unspecified: Secondary | ICD-10-CM | POA: Diagnosis not present

## 2015-12-16 DIAGNOSIS — Z853 Personal history of malignant neoplasm of breast: Secondary | ICD-10-CM | POA: Insufficient documentation

## 2015-12-16 DIAGNOSIS — R1010 Upper abdominal pain, unspecified: Secondary | ICD-10-CM | POA: Diagnosis present

## 2015-12-16 DIAGNOSIS — K219 Gastro-esophageal reflux disease without esophagitis: Secondary | ICD-10-CM | POA: Diagnosis not present

## 2015-12-16 LAB — COMPREHENSIVE METABOLIC PANEL
ALBUMIN: 4.1 g/dL (ref 3.5–5.0)
ALK PHOS: 52 U/L (ref 38–126)
ALT: 19 U/L (ref 14–54)
ANION GAP: 8 (ref 5–15)
AST: 22 U/L (ref 15–41)
BUN: 9 mg/dL (ref 6–20)
CALCIUM: 9.2 mg/dL (ref 8.9–10.3)
CHLORIDE: 103 mmol/L (ref 101–111)
CO2: 24 mmol/L (ref 22–32)
CREATININE: 0.76 mg/dL (ref 0.44–1.00)
GFR calc non Af Amer: >60 mL/min (ref >60)
GLUCOSE: 120 mg/dL — AB (ref 65–99)
Potassium: 3.7 mmol/L (ref 3.5–5.1)
SODIUM: 135 mmol/L (ref 135–145)
Total Bilirubin: 0.8 mg/dL (ref 0.3–1.2)
Total Protein: 6.6 g/dL (ref 6.5–8.1)

## 2015-12-16 LAB — URINALYSIS, ROUTINE W REFLEX MICROSCOPIC
BILIRUBIN URINE: NEGATIVE
GLUCOSE, UA: NEGATIVE mg/dL
HGB URINE DIPSTICK: NEGATIVE
Ketones, ur: 15 mg/dL — AB
Leukocytes, UA: NEGATIVE
Nitrite: NEGATIVE
PH: 8.5 — AB (ref 5.0–8.0)
Protein, ur: NEGATIVE mg/dL
SPECIFIC GRAVITY, URINE: 1.011 (ref 1.005–1.030)

## 2015-12-16 LAB — CBC
HCT: 41.6 % (ref 36.0–46.0)
HEMOGLOBIN: 14.2 g/dL (ref 12.0–15.0)
MCH: 29.9 pg (ref 26.0–34.0)
MCHC: 34.1 g/dL (ref 30.0–36.0)
MCV: 87.6 fL (ref 78.0–100.0)
Platelets: 300 10*3/uL (ref 150–400)
RBC: 4.75 MIL/uL (ref 3.87–5.11)
RDW: 13.2 % (ref 11.5–15.5)
WBC: 5.5 10*3/uL (ref 4.0–10.5)

## 2015-12-16 LAB — LIPASE, BLOOD: LIPASE: 27 U/L (ref 11–51)

## 2015-12-16 MED ORDER — LORAZEPAM 1 MG PO TABS: 1.0000 mg | ORAL_TABLET | Freq: Two times a day (BID) | ORAL | Status: DC | PRN

## 2015-12-16 MED ORDER — SODIUM CHLORIDE 0.9 % IV BOLUS (SEPSIS)
1000.0000 mL | Freq: Once | INTRAVENOUS | Status: AC
  Administered 2015-12-16: 1000 mL via INTRAVENOUS

## 2015-12-16 MED ORDER — SODIUM CHLORIDE 0.9 % IV SOLN: INTRAVENOUS | Status: DC

## 2015-12-16 MED ORDER — GI COCKTAIL ~~LOC~~
30.0000 mL | Freq: Once | ORAL | Status: AC
  Administered 2015-12-16: 30 mL via ORAL
  Filled 2015-12-16: qty 30

## 2015-12-16 MED ORDER — PROMETHAZINE HCL 25 MG/ML IJ SOLN
12.5000 mg | Freq: Once | INTRAMUSCULAR | Status: AC
  Administered 2015-12-16: 12.5 mg via INTRAVENOUS
  Filled 2015-12-16: qty 1

## 2015-12-16 NOTE — ED Provider Notes (Signed)
CSN: [id number]     Arrival date & time 12/16/15  1043 History   First MD Initiated Contact with Patient 12/16/15 1056     Chief Complaint  Patient presents with  . Abdominal Pain    x 2 days, recurrent  . Nausea    HPI Pt has been having trouble with insomnia with the last few days.  She feels like her mind has been racing and she cant rest.  She saw her PCP and was given a prescription for ramelton tablets.  SHe took it last evening and she continued to feel "wired".  She conitnues to have burning pain in her upper abdomen.  That started several months after a course of abx.  She did vomit this am. She has been evalauted in the past for this.  She had a CT scan.    She is scheduled to have an endoscopy on Thursday.  Past Medical History  Diagnosis Date  . Cellulitis and abscess of upper arm and forearm   . Postmastectomy lymphedema rt side  . History of right mastectomy 2004    chemo/ radiation  . Migraines   . Cancer ([place]) rt breast    2004/ chemo/ radiation  . Allergy   . Hyperlipidemia   . PONV (postoperative nausea and vomiting)     cannot take zofran  . GERD (gastroesophageal reflux disease)   . Sentinel node   . Anxiety   . Left bundle branch block 2016  . Hypertension   . H/O Helicobacter infection 2017   Past Surgical History  Procedure Laterality Date  . Rt mastectomy  2004    lymph nodes-7-axillary node dissection  . Nasal sinus surgery  1990/2001  . Mandible surgery  1991  . Port-a-cath removal      in and now out  . Nasal sinus surgery    . Mouth surgery    . Orif finger fracture  02/14/2012    Procedure: OPEN REDUCTION INTERNAL FIXATION (ORIF) METACARPAL (FINGER) FRACTURE;  Surgeon: Wynonia Sours, MD;  Location: [facility];  Service: Orthopedics;  Laterality: Right;  RIGHT FIFTH   . Breast lumpectomy  2004     br bx  . Orif metacarpal fracture  8/13    rt   . Repair extensor tendon Left 05/30/2013    Procedure: RELEASE TRANSPOSITION EXTENSOR  POLLICUS LONGUS LEFT WRIST;  Surgeon: Wynonia Sours, MD;  Location: [facility];  Service: Orthopedics;  Laterality: Left;  . Breast lumpectomy with axillary lymph node dissection    . Carpal tunnel release    . Wrist arthroscopy Left 2014  . Shoulder arthroscopy with rotator cuff repair and subacromial decompression Left 11/22/2013    Procedure: LEFT SHOULDER ARTHROSCOPY WITH SUBACROMIAL DECOMPRESSION DISTAL CLAVICLE RESECTION REPAIR  ROTATOR CUFF AS NEEDED ;  Surgeon: Cammie Sickle., MD;  Location: [facility];  Service: Orthopedics;  Laterality: Left;  . Cataract extraction Left 07/12/2013   Family History  Problem Relation Age of Onset  . Heart disease Father   . Cancer Maternal Aunt     breast  . Heart disease Paternal Uncle   . Heart disease Paternal Grandmother   . Heart disease Paternal Grandfather   . Heart disease Mother   . Hypertension Mother   . Diabetes Brother    Social History  Substance Use Topics  . Smoking status: Never Smoker   . Smokeless tobacco: Never Used  . Alcohol Use: No   OB  History    Gravida Para Term Preterm AB TAB SAB Ectopic Multiple Living   3 2        1      Review of Systems  All other systems reviewed and are negative.     Allergies  Acetaminophen; Amoxicillin-pot clavulanate; Azithromycin; Cefuroxime axetil; Cephalexin; Hydrocodone-acetaminophen; Hydrocodone-acetaminophen; Levofloxacin; Morphine and related; Ondansetron; Propoxyphene n-acetaminophen; Rosuvastatin; Sulfonamide derivatives; Tramadol; Trazodone and nefazodone; Vioxx; Zofran; Clarithromycin; Codeine; and Sulfamethoxazole  Home Medications   Prior to Admission medications   Medication Sig Start Date End Date Taking? Authorizing Provider  ALPRAZOLAM XR 2 MG 24 hr tablet Take 2 mg by mouth 2 (two) times daily. 09/27/15   Historical Provider, MD  amoxicillin (AMOXIL) 500 MG capsule Reported on 12/05/2015 11/19/15   Historical Provider, MD  calcium  carbonate (OS-CAL) 600 MG TABS Take 600 mg by mouth 2 (two) times daily with a meal.      Historical Provider, MD  Cholecalciferol (VITAMIN D) 2000 UNITS CAPS Take 1 capsule by mouth daily.     Historical Provider, MD  dicyclomine (BENTYL) 10 MG capsule Take 1 capsule by mouth as needed. 11/14/15   Historical Provider, MD  EFFEXOR XR 37.5 MG 24 hr capsule Take 1 capsule (37.5 mg total) by mouth daily with breakfast. Reported on 12/05/2015 12/05/15   Nunzio Cobbs, MD  eletriptan (RELPAX) 40 MG tablet Take 40 mg by mouth as needed for migraine or headache. Reported on 10/20/2015    Historical Provider, MD  ezetimibe (ZETIA) 10 MG tablet Take 10 mg by mouth at bedtime.     Historical Provider, MD  LORazepam (ATIVAN) 1 MG tablet Take 1 tablet (1 mg total) by mouth 3 times/day as needed-between meals & bedtime for anxiety or sleep. 12/16/15   Dorie Rank, MD  metoCLOPramide (REGLAN) 10 MG tablet Take 1 tablet (10 mg total) by mouth every 8 (eight) hours as needed for nausea or vomiting. 12/07/15   Jola Schmidt, MD  metroNIDAZOLE (FLAGYL) 250 MG tablet Reported on 12/05/2015 11/19/15   Historical Provider, MD  Multiple Vitamins-Minerals (MULTIVITAMINS THER. W/MINERALS) TABS Take 1 tablet by mouth daily.      Historical Provider, MD  nebivolol (BYSTOLIC) 5 MG tablet Take 1 tablet (5 mg total) by mouth daily. 11/25/15   Thayer Headings, MD  Olopatadine HCl (PATADAY) 0.2 % SOLN Place 1 drop into both eyes as needed.     Historical Provider, MD  omeprazole (PRILOSEC) 40 MG capsule  11/19/15   Historical Provider, MD  Probiotic Product (PROBIOTIC DAILY PO) Take by mouth daily.     Historical Provider, MD  promethazine (PHENERGAN) 25 MG tablet Take 1 tablet (25 mg total) by mouth every 6 (six) hours as needed for nausea or vomiting. 12/07/15   Jola Schmidt, MD  traMADol (ULTRAM) 50 MG tablet Take 1 tablet (50 mg total) by mouth every 6 (six) hours as needed. 12/07/15   Jola Schmidt, MD   BP 159/63 mmHg  Pulse 65   Temp(Src) 97.8 F (36.6 C) (Oral)  Resp 18  SpO2 96%  LMP 01/10/2003 Physical Exam  Constitutional: She appears well-developed and well-nourished. No distress.  HENT:  Head: Normocephalic and atraumatic.  Right Ear: External ear normal.  Left Ear: External ear normal.  Eyes: Conjunctivae are normal. Right eye exhibits no discharge. Left eye exhibits no discharge. No scleral icterus.  Neck: Neck supple. No tracheal deviation present.  Cardiovascular: Normal rate, regular rhythm and intact distal pulses.   Pulmonary/Chest: Effort  normal and breath sounds normal. No stridor. No respiratory distress. She has no wheezes. She has no rales.  Abdominal: Soft. Bowel sounds are normal. She exhibits no distension. There is tenderness in the epigastric area. There is no rebound and no guarding.  Musculoskeletal: She exhibits no edema or tenderness.  Neurological: She is alert. She has normal strength. No cranial nerve deficit (no facial droop, extraocular movements intact, no slurred speech) or sensory deficit. She exhibits normal muscle tone. She displays no seizure activity. Coordination normal.  Skin: Skin is warm and dry. No rash noted.  Psychiatric: Her mood appears anxious.  Nursing note and vitals reviewed.   ED Course  Procedures (including critical care time)  Medications  sodium chloride 0.9 % bolus 1,000 mL (1,000 mLs Intravenous New Bag/Given 12/16/15 1130)    And  0.9 %  sodium chloride infusion (not administered)  promethazine (PHENERGAN) injection 12.5 mg (12.5 mg Intravenous Given 12/16/15 1156)  gi cocktail (Maalox,Lidocaine,Donnatal) (30 mLs Oral Given 12/16/15 1130)    Labs Review Labs Reviewed  COMPREHENSIVE METABOLIC PANEL - Abnormal; Notable for the following:    Glucose, Bld 120 (*)    All other components within normal limits  URINALYSIS, ROUTINE W REFLEX MICROSCOPIC (NOT AT [facility]) - Abnormal; Notable for the following:    APPearance CLOUDY (*)    pH 8.5 (*)    Ketones,  ur 15 (*)    All other components within normal limits  LIPASE, BLOOD  CBC     MDM   Final diagnoses:  Gastroesophageal reflux disease, esophagitis presence not specified  Insomnia    Patient's laboratory tests are reassuring. She has normal LFTs, white count of lipase. She has been having issues with this abdominal discomfort for several months. Patient scheduled for an endoscopy this week. I doubt any acute emergency medical condition at this point. I think she is stable to follow up with the endoscopy as planned  Patient has been having trouble with insomnia. I think there could be a component of anxiety. I discussed adding trazodone however she took that previously and had adverse side effects. Recommend patient follow up with her primary care doctor. She may want to see a psychiatrist.  Will give a few tablets of ativan in the meantime.  Dorie Rank, MD 12/16/15 1332

## 2015-12-16 NOTE — Discharge Instructions (Signed)
Heartburn Heartburn is a type of pain or discomfort that can happen in the throat or chest. It is often described as a burning pain. It may also cause a bad taste in the mouth. Heartburn may feel worse when you lie down or bend over, and it is often worse at night. Heartburn may be caused by stomach contents that move back up into the esophagus (reflux). HOME CARE INSTRUCTIONS Take these actions to decrease your discomfort and to help avoid complications. Diet  Follow a diet as recommended by your health care provider. This may involve avoiding foods and drinks such as:  Coffee and tea (with or without caffeine).  Drinks that contain alcohol.  Energy drinks and sports drinks.  Carbonated drinks or sodas.  Chocolate and cocoa.  Peppermint and mint flavorings.  Garlic and onions.  Horseradish.  Spicy and acidic foods, including peppers, chili powder, curry powder, vinegar, hot sauces, and barbecue sauce.  Citrus fruit juices and citrus fruits, such as oranges, lemons, and limes.  Tomato-based foods, such as red sauce, chili, salsa, and pizza with red sauce.  Fried and fatty foods, such as donuts, french fries, potato chips, and high-fat dressings.  High-fat meats, such as hot dogs and fatty cuts of red and white meats, such as rib eye steak, sausage, ham, and bacon.  High-fat dairy items, such as whole milk, butter, and cream cheese.  Eat small, frequent meals instead of large meals.  Avoid drinking large amounts of liquid with your meals.  Avoid eating meals during the 2-3 hours before bedtime.  Avoid lying down right after you eat.  Do not exercise right after you eat. General Instructions  Pay attention to any changes in your symptoms.  Take over-the-counter and prescription medicines only as told by your health care provider. Do not take aspirin, ibuprofen, or other NSAIDs unless your health care provider told you to do so.  Do not use any tobacco products,  including cigarettes, chewing tobacco, and e-cigarettes. If you need help quitting, ask your health care provider.  Wear loose-fitting clothing. Do not wear anything tight around your waist that causes pressure on your abdomen.  Raise (elevate) the head of your bed about 6 inches (15 cm).  Try to reduce your stress, such as with yoga or meditation. If you need help reducing stress, ask your health care provider.  If you are overweight, reduce your weight to an amount that is healthy for you. Ask your health care provider for guidance about a safe weight loss goal.  Keep all follow-up visits as told by your health care provider. This is important. SEEK MEDICAL CARE IF:  You have new symptoms.  You have unexplained weight loss.  You have difficulty swallowing, or it hurts to swallow.  You have wheezing or a persistent cough.  Your symptoms do not improve with treatment.  You have frequent heartburn for more than two weeks. SEEK IMMEDIATE MEDICAL CARE IF:  You have pain in your arms, neck, jaw, teeth, or back.  You feel sweaty, dizzy, or light-headed.  You have chest pain or shortness of breath.  You vomit and your vomit looks like blood or coffee grounds.  Your stool is bloody or black.   This information is not intended to replace advice given to you by your health care provider. Make sure you discuss any questions you have with your health care provider.   Document Released: 11/14/2008 Document Revised: 03/19/2015 Document Reviewed: 10/23/2014 Elsevier Interactive Patient Education [employer]  Insomnia Insomnia is a sleep disorder that makes it difficult to fall asleep or to stay asleep. Insomnia can cause tiredness (fatigue), low energy, difficulty concentrating, mood swings, and poor performance at work or school.  There are three different ways to classify insomnia:  Difficulty falling asleep.  Difficulty staying asleep.  Waking up too early in the  morning. Any type of insomnia can be long-term (chronic) or short-term (acute). Both are common. Short-term insomnia usually lasts for three months or less. Chronic insomnia occurs at least three times a week for longer than three months. CAUSES  Insomnia may be caused by another condition, situation, or substance, such as:  Anxiety.  Certain medicines.  Gastroesophageal reflux disease (GERD) or other gastrointestinal conditions.  Asthma or other breathing conditions.  Restless legs syndrome, sleep apnea, or other sleep disorders.  Chronic pain.  Menopause. This may include hot flashes.  Stroke.  Abuse of alcohol, tobacco, or illegal drugs.  Depression.  Caffeine.   Neurological disorders, such as Alzheimer disease.  An overactive thyroid (hyperthyroidism). The cause of insomnia may not be known. RISK FACTORS Risk factors for insomnia include:  Gender. Women are more commonly affected than men.  Age. Insomnia is more common as you get older.  Stress. This may involve your professional or personal life.  Income. Insomnia is more common in people with lower income.  Lack of exercise.   Irregular work schedule or night shifts.  Traveling between different time zones. SIGNS AND SYMPTOMS If you have insomnia, trouble falling asleep or trouble staying asleep is the main symptom. This may lead to other symptoms, such as:  Feeling fatigued.  Feeling nervous about going to sleep.  Not feeling rested in the morning.  Having trouble concentrating.  Feeling irritable, anxious, or depressed. TREATMENT  Treatment for insomnia depends on the cause. If your insomnia is caused by an underlying condition, treatment will focus on addressing the condition. Treatment may also include:   Medicines to help you sleep.  Counseling or therapy.  Lifestyle adjustments. HOME CARE INSTRUCTIONS   Take medicines only as directed by your health care provider.  Keep regular  sleeping and waking hours. Avoid naps.  Keep a sleep diary to help you and your health care provider figure out what could be causing your insomnia. Include:   When you sleep.  When you wake up during the night.  How well you sleep.   How rested you feel the next day.  Any side effects of medicines you are taking.  What you eat and drink.   Make your bedroom a comfortable place where it is easy to fall asleep:  Put up shades or special blackout curtains to block light from outside.  Use a white noise machine to block noise.  Keep the temperature cool.   Exercise regularly as directed by your health care provider. Avoid exercising right before bedtime.  Use relaxation techniques to manage stress. Ask your health care provider to suggest some techniques that may work well for you. These may include:  Breathing exercises.  Routines to release muscle tension.  Visualizing peaceful scenes.  Cut back on alcohol, caffeinated beverages, and cigarettes, especially close to bedtime. These can disrupt your sleep.  Do not overeat or eat spicy foods right before bedtime. This can lead to digestive discomfort that can make it hard for you to sleep.  Limit screen use before bedtime. This includes:  Watching TV.  Using your smartphone, tablet, and computer.  Stick to a routine.  This can help you fall asleep faster. Try to do a quiet activity, brush your teeth, and go to bed at the same time each night.  Get out of bed if you are still awake after 15 minutes of trying to sleep. Keep the lights down, but try reading or doing a quiet activity. When you feel sleepy, go back to bed.  Make sure that you drive carefully. Avoid driving if you feel very sleepy.  Keep all follow-up appointments as directed by your health care provider. This is important. SEEK MEDICAL CARE IF:   You are tired throughout the day or have trouble in your daily routine due to sleepiness.  You continue to  have sleep problems or your sleep problems get worse. SEEK IMMEDIATE MEDICAL CARE IF:   You have serious thoughts about hurting yourself or someone else.   This information is not intended to replace advice given to you by your health care provider. Make sure you discuss any questions you have with your health care provider.   Document Released: 06/25/2000 Document Revised: 03/19/2015 Document Reviewed: 03/29/2014 Elsevier Interactive Patient Education [employer].

## 2015-12-16 NOTE — ED Notes (Addendum)
Patient states that since she was started on medications for H. Pylori she has had abd burning. Patient hasn't been able to sleep "past 2 and half nights and don't know why".

## 2015-12-16 NOTE — ED Notes (Signed)
MD at bedside. 

## 2015-12-16 NOTE — ED Notes (Signed)
Per EMS- Pt called EMS with c/o recurrent abdominal pain and nausea, burning sensation in stomach. Pt is alert, oriented, ambulatory

## 2015-12-18 ENCOUNTER — Other Ambulatory Visit: Payer: Self-pay | Admitting: Gastroenterology

## 2015-12-18 ENCOUNTER — Telehealth: Payer: Self-pay | Admitting: Obstetrics and Gynecology

## 2015-12-18 ENCOUNTER — Emergency Department (HOSPITAL_COMMUNITY)
Admission: EM | Admit: 2015-12-18 | Discharge: 2015-12-18 | Disposition: A | Discharge status: Home / Self Care | Payer: Commercial Managed Care - HMO | Attending: Emergency Medicine | Admitting: Emergency Medicine

## 2015-12-18 ENCOUNTER — Emergency Department (HOSPITAL_COMMUNITY): Payer: Commercial Managed Care - HMO

## 2015-12-18 ENCOUNTER — Encounter (HOSPITAL_COMMUNITY): Payer: Self-pay

## 2015-12-18 DIAGNOSIS — E785 Hyperlipidemia, unspecified: Secondary | ICD-10-CM | POA: Insufficient documentation

## 2015-12-18 DIAGNOSIS — Z79899 Other long term (current) drug therapy: Secondary | ICD-10-CM | POA: Insufficient documentation

## 2015-12-18 DIAGNOSIS — R1012 Left upper quadrant pain: Secondary | ICD-10-CM | POA: Insufficient documentation

## 2015-12-18 DIAGNOSIS — I10 Essential (primary) hypertension: Secondary | ICD-10-CM | POA: Insufficient documentation

## 2015-12-18 LAB — CBC WITH DIFFERENTIAL/PLATELET
BASOS ABS: 0.1 10*3/uL (ref 0.0–0.1)
Basophils Relative: 1 %
EOS PCT: 0 %
Eosinophils Absolute: 0 10*3/uL (ref 0.0–0.7)
HEMATOCRIT: 44.2 % (ref 36.0–46.0)
HEMOGLOBIN: 15.1 g/dL — AB (ref 12.0–15.0)
Lymphocytes Relative: 25 %
Lymphs Abs: 1.8 10*3/uL (ref 0.7–4.0)
MCH: 30.5 pg (ref 26.0–34.0)
MCHC: 34.2 g/dL (ref 30.0–36.0)
MCV: 89.3 fL (ref 78.0–100.0)
Monocytes Absolute: 0.6 10*3/uL (ref 0.1–1.0)
Monocytes Relative: 8 %
Neutro Abs: 4.8 10*3/uL (ref 1.7–7.7)
Neutrophils Relative %: 66 %
Platelets: 311 10*3/uL (ref 150–400)
RBC: 4.95 MIL/uL (ref 3.87–5.11)
RDW: 13.4 % (ref 11.5–15.5)
WBC: 7.3 10*3/uL (ref 4.0–10.5)

## 2015-12-18 LAB — COMPREHENSIVE METABOLIC PANEL
ALBUMIN: 4.4 g/dL (ref 3.5–5.0)
ALK PHOS: 58 U/L (ref 38–126)
ALT: 18 U/L (ref 14–54)
AST: 22 U/L (ref 15–41)
Anion gap: 7 (ref 5–15)
BILIRUBIN TOTAL: 0.6 mg/dL (ref 0.3–1.2)
BUN: 9 mg/dL (ref 6–20)
CO2: 27 mmol/L (ref 22–32)
CREATININE: 0.9 mg/dL (ref 0.44–1.00)
Calcium: 9.4 mg/dL (ref 8.9–10.3)
Chloride: 101 mmol/L (ref 101–111)
GFR calc Af Amer: >60 mL/min (ref >60)
GLUCOSE: 122 mg/dL — AB (ref 65–99)
Potassium: 3.7 mmol/L (ref 3.5–5.1)
Sodium: 135 mmol/L (ref 135–145)
TOTAL PROTEIN: 7.3 g/dL (ref 6.5–8.1)

## 2015-12-18 LAB — LIPASE, BLOOD: LIPASE: 18 U/L (ref 11–51)

## 2015-12-18 LAB — I-STAT CG4 LACTIC ACID, ED: Lactic Acid, Venous: 2.59 mmol/L (ref 0.5–2.0)

## 2015-12-18 MED ORDER — FENTANYL CITRATE (PF) 100 MCG/2ML IJ SOLN
50.0000 ug | Freq: Once | INTRAMUSCULAR | Status: AC
  Administered 2015-12-18: 50 ug via INTRAVENOUS
  Filled 2015-12-18: qty 2

## 2015-12-18 MED ORDER — METOCLOPRAMIDE HCL 10 MG PO TABS: 10.0000 mg | ORAL_TABLET | Freq: Three times a day (TID) | ORAL | Status: DC | PRN

## 2015-12-18 MED ORDER — SODIUM CHLORIDE 0.9 % IV BOLUS (SEPSIS)
1000.0000 mL | Freq: Once | INTRAVENOUS | Status: AC
  Administered 2015-12-18: 1000 mL via INTRAVENOUS

## 2015-12-18 MED ORDER — OMEPRAZOLE 20 MG PO CPDR: 20.0000 mg | DELAYED_RELEASE_CAPSULE | Freq: Every day | ORAL | Status: DC

## 2015-12-18 NOTE — ED Notes (Signed)
Pt reports she had an endoscopy around 0900 at [facility] and reports had some biopsies done.  Pt c/o abd pain x 1 hour.  Reports nausea, no vomiting.

## 2015-12-18 NOTE — ED Provider Notes (Signed)
CSN: [id number]     Arrival date & time 12/18/15  1204 History   First MD Initiated Contact with Patient 12/18/15 1216     Chief Complaint  Patient presents with  . S/O endoscopy, abd pain      (Consider location/radiation/quality/duration/timing/severity/associated sxs/prior Treatment) HPI  The patient is a 65 year old female, she presents to the hospital with several weeks of worsening abdominal pain. She has noted that this pain is worse after eating, it is located in the epigastrium, right and left upper quadrants, she was diagnosed with H. pylori stomach infection and given antibiotics which she states made the pain worse so she stopped taking them. She underwent endoscopy today and states that after endoscopy she has had severe abdominal pain. She did have some biopsies that were done. The patient has had abdominal pain for over 1 hour, it is severe, there is nausea but no vomiting. She denies any blood in her stools, she has not had anything to eat since last night. She has had a couple of CT scans in the last couple of weeks to evaluate for the source of her abdominal pain without obvious source. She was most recently seen 2 days ago at which time labs were unremarkable.  Past Medical History  Diagnosis Date  . Cellulitis and abscess of upper arm and forearm   . Postmastectomy lymphedema rt side  . History of right mastectomy 2004    chemo/ radiation  . Migraines   . Cancer ([place]) rt breast    2004/ chemo/ radiation  . Allergy   . Hyperlipidemia   . PONV (postoperative nausea and vomiting)     cannot take zofran  . GERD (gastroesophageal reflux disease)   . Sentinel node   . Anxiety   . Left bundle branch block 2016  . Hypertension   . H/O Helicobacter infection 2017   Past Surgical History  Procedure Laterality Date  . Rt mastectomy  2004    lymph nodes-7-axillary node dissection  . Nasal sinus surgery  1990/2001  . Mandible surgery  1991  . Port-a-cath removal      in  and now out  . Nasal sinus surgery    . Mouth surgery    . Orif finger fracture  02/14/2012    Procedure: OPEN REDUCTION INTERNAL FIXATION (ORIF) METACARPAL (FINGER) FRACTURE;  Surgeon: Wynonia Sours, MD;  Location: [place];  Service: Orthopedics;  Laterality: Right;  RIGHT FIFTH   . Breast lumpectomy  2004     br bx  . Orif metacarpal fracture  8/13    rt   . Repair extensor tendon Left 05/30/2013    Procedure: RELEASE TRANSPOSITION EXTENSOR POLLICUS LONGUS LEFT WRIST;  Surgeon: Wynonia Sours, MD;  Location: [place];  Service: Orthopedics;  Laterality: Left;  . Breast lumpectomy with axillary lymph node dissection    . Carpal tunnel release    . Wrist arthroscopy Left 2014  . Shoulder arthroscopy with rotator cuff repair and subacromial decompression Left 11/22/2013    Procedure: LEFT SHOULDER ARTHROSCOPY WITH SUBACROMIAL DECOMPRESSION DISTAL CLAVICLE RESECTION REPAIR  ROTATOR CUFF AS NEEDED ;  Surgeon: Cammie Sickle., MD;  Location: [place];  Service: Orthopedics;  Laterality: Left;  . Cataract extraction Left 07/12/2013   Family History  Problem Relation Age of Onset  . Heart disease Father   . Cancer Maternal Aunt     breast  . Heart disease Paternal Uncle   . Heart  disease Paternal Grandmother   . Heart disease Paternal Grandfather   . Heart disease Mother   . Hypertension Mother   . Diabetes Brother    Social History  Substance Use Topics  . Smoking status: Never Smoker   . Smokeless tobacco: Never Used  . Alcohol Use: No   OB History    Gravida Para Term Preterm AB TAB SAB Ectopic Multiple Living   3 2        1      Review of Systems  All other systems reviewed and are negative.     Allergies  Acetaminophen; Amoxicillin-pot clavulanate; Azithromycin; Cefuroxime axetil; Cephalexin; Hydrocodone-acetaminophen; Hydrocodone-acetaminophen; Levofloxacin; Morphine and related; Ondansetron; Propoxyphene n-acetaminophen;  Rosuvastatin; Sulfonamide derivatives; Tramadol; Trazodone and nefazodone; Vioxx; Zofran; Clarithromycin; Codeine; and Sulfamethoxazole  Home Medications   Prior to Admission medications   Medication Sig Start Date End Date Taking? Authorizing Provider  ALPRAZolam Duanne Moron) 0.5 MG tablet Take 0.5 mg by mouth 3 (three) times daily as needed for anxiety.  12/02/15  Yes Historical Provider, MD  ALPRAZOLAM XR 2 MG 24 hr tablet Take 2 mg by mouth 2 (two) times daily. 09/27/15  Yes Historical Provider, MD  calcium carbonate (OS-CAL) 600 MG TABS Take 600 mg by mouth 2 (two) times daily with a meal.     Yes Historical Provider, MD  Cholecalciferol (VITAMIN D) 2000 UNITS CAPS Take 1 capsule by mouth daily.    Yes Historical Provider, MD  dicyclomine (BENTYL) 10 MG capsule Take 1 capsule by mouth as needed. 11/14/15  Yes Historical Provider, MD  EFFEXOR XR 37.5 MG 24 hr capsule Take 1 capsule (37.5 mg total) by mouth daily with breakfast. Reported on 12/05/2015 12/05/15  Yes Brook E Yisroel Ramming, MD  ezetimibe (ZETIA) 10 MG tablet Take 10 mg by mouth at bedtime.    Yes Historical Provider, MD  Multiple Vitamins-Minerals (MULTIVITAMINS THER. W/MINERALS) TABS Take 1 tablet by mouth daily.     Yes Historical Provider, MD  nebivolol (BYSTOLIC) 5 MG tablet Take 1 tablet (5 mg total) by mouth daily. 11/25/15  Yes Thayer Headings, MD  Olopatadine HCl (PATADAY) 0.2 % SOLN Place 1 drop into both eyes as needed.    Yes Historical Provider, MD  Probiotic Product (PROBIOTIC DAILY PO) Take by mouth daily.    Yes Historical Provider, MD  promethazine (PHENERGAN) 25 MG tablet Take 1 tablet (25 mg total) by mouth every 6 (six) hours as needed for nausea or vomiting. 12/07/15  Yes Jola Schmidt, MD  eletriptan (RELPAX) 40 MG tablet Take 40 mg by mouth as needed for migraine or headache. Reported on 10/20/2015    Historical Provider, MD  LORazepam (ATIVAN) 1 MG tablet Take 1 tablet (1 mg total) by mouth 3 times/day as needed-between  meals & bedtime for anxiety or sleep. Patient not taking: Reported on 12/18/2015 12/16/15   Dorie Rank, MD  metoCLOPramide (REGLAN) 10 MG tablet Take 1 tablet (10 mg total) by mouth 3 (three) times daily as needed for nausea (headache / nausea). 12/18/15   Noemi Chapel, MD  omeprazole (PRILOSEC) 20 MG capsule Take 1 capsule (20 mg total) by mouth daily. 12/18/15   Noemi Chapel, MD   BP 167/80 mmHg  Pulse 73  Temp(Src) 98.1 F (36.7 C) (Tympanic)  Resp 20  Ht 5\' 2"  (1.575 m)  Wt 124 lb (56.246 kg)  BMI 22.67 kg/m2  SpO2 97%  LMP 01/10/2003 Physical Exam  Constitutional: She appears well-developed and well-nourished.  Uncomfortable appearing  HENT:  Head: Normocephalic and atraumatic.  Mouth/Throat: Oropharynx is clear and moist. No oropharyngeal exudate.  Eyes: Conjunctivae and EOM are normal. Pupils are equal, round, and reactive to light. Right eye exhibits no discharge. Left eye exhibits no discharge. No scleral icterus.  Neck: Normal range of motion. Neck supple. No JVD present. No thyromegaly present.  Cardiovascular: Normal rate, regular rhythm, normal heart sounds and intact distal pulses.  Exam reveals no gallop and no friction rub.   No murmur heard. Pulmonary/Chest: Effort normal and breath sounds normal. No respiratory distress. She has no wheezes. She has no rales.  Abdominal: Soft. Bowel sounds are normal. She exhibits no distension and no mass. There is tenderness (tender to palpation in the epigastrium, right and left upper quadrants, no tenderness in the lower abdomen.).  Musculoskeletal: Normal range of motion. She exhibits no edema or tenderness.  Lymphadenopathy:    She has no cervical adenopathy.  Neurological: She is alert. Coordination normal.  Skin: Skin is warm and dry. No rash noted. No erythema.  Psychiatric: She has a normal mood and affect. Her behavior is normal.  Nursing note and vitals reviewed.   ED Course  Procedures (including critical care time) Labs  Review Labs Reviewed  CBC WITH DIFFERENTIAL/PLATELET - Abnormal; Notable for the following:    Hemoglobin 15.1 (*)    All other components within normal limits  COMPREHENSIVE METABOLIC PANEL - Abnormal; Notable for the following:    Glucose, Bld 122 (*)    All other components within normal limits  I-STAT CG4 LACTIC ACID, ED - Abnormal; Notable for the following:    Lactic Acid, Venous 2.59 (*)    All other components within normal limits  LIPASE, BLOOD  I-STAT CG4 LACTIC ACID, ED    Imaging Review Dg Abd Acute W/chest  12/18/2015  CLINICAL DATA:  Severe abdominal pain. Status post endoscopy and biopsies today. EXAM: DG ABDOMEN ACUTE W/ 1V CHEST COMPARISON:  Chest radiographs dated 07/11/2013 and abdomen and pelvis CT dated 12/07/2015. FINDINGS: The cardiac silhouette remains borderline enlarged. Postmastectomy changes on the right with axillary surgical clips. Left nipple shadow. Clear lungs. No pneumomediastinum or pneumothorax. No pleural fluid. Gaseous distention of multiple small bowel loops without pathological dilatation. No free peritoneal air. Mild scoliosis. Lumbar spine degenerative changes. IMPRESSION: 1. Gas distended small bowel loops without obstruction. 2. Borderline cardiomegaly. Electronically Signed   By: Claudie Revering M.D.   On: 12/18/2015 13:42   I have personally reviewed and evaluated these images and lab results as part of my medical decision-making.    MDM   Final diagnoses:  Left upper quadrant pain    The patient is uncomfortable appearing but does not have a surgical abdomen. I will evaluate for possible perforation with plain films, labs, pain medication, reevaluate. The patient does not have peritoneal exam  Abd pain much better after meds - VS reassuring - labs and imaging unremarkable and with improving sx and pt spouse report of "normal EGD other than some irritation at the pylorus", no need for CT - no perforation - lots of gas - reglan and PPI,  Meds  given in ED:  Medications  sodium chloride 0.9 % bolus 1,000 mL (0 mLs Intravenous Stopped 12/18/15 1346)  fentaNYL (SUBLIMAZE) injection 50 mcg (50 mcg Intravenous Given 12/18/15 1237)    New Prescriptions   METOCLOPRAMIDE (REGLAN) 10 MG TABLET    Take 1 tablet (10 mg total) by mouth 3 (three) times daily as needed for nausea (headache /  nausea).   OMEPRAZOLE (PRILOSEC) 20 MG CAPSULE    Take 1 capsule (20 mg total) by mouth daily.      Noemi Chapel, MD 12/18/15 [phone]

## 2015-12-18 NOTE — ED Notes (Signed)
Pt made aware to return if symptoms worsen or if any life threatening symptoms occur.   

## 2015-12-18 NOTE — Telephone Encounter (Signed)
Spoke with patient. Advised patient that Effexor XR does come in a generic called Venlafaxine XR. Patient is agreeable and states she needed this information for starting on medicare at the end of the month. Reports she is currently getting the generic of Effexor. She will return call with any further questions.  Routing to provider for final review. Patient agreeable to disposition. Will close encounter.

## 2015-12-18 NOTE — ED Notes (Signed)
2.59 lactic acid reported to Dr. Sabra Heck.

## 2015-12-18 NOTE — Telephone Encounter (Signed)
Patient calling asking if Effexor XL comes in generic form.

## 2015-12-18 NOTE — Discharge Instructions (Signed)

## 2015-12-18 NOTE — ED Notes (Signed)
Dr. Sabra Heck notified that pt bp was 175/74, pttold to take bp medication.

## 2015-12-19 ENCOUNTER — Encounter (HOSPITAL_COMMUNITY): Payer: Self-pay | Admitting: Emergency Medicine

## 2015-12-19 ENCOUNTER — Emergency Department (HOSPITAL_COMMUNITY)
Admission: EM | Admit: 2015-12-19 | Discharge: 2015-12-19 | Disposition: A | Discharge status: Home / Self Care | Payer: Commercial Managed Care - HMO | Attending: Emergency Medicine | Admitting: Emergency Medicine

## 2015-12-19 ENCOUNTER — Emergency Department (HOSPITAL_COMMUNITY): Payer: Commercial Managed Care - HMO

## 2015-12-19 DIAGNOSIS — R112 Nausea with vomiting, unspecified: Secondary | ICD-10-CM | POA: Insufficient documentation

## 2015-12-19 DIAGNOSIS — I10 Essential (primary) hypertension: Secondary | ICD-10-CM | POA: Insufficient documentation

## 2015-12-19 DIAGNOSIS — R1011 Right upper quadrant pain: Secondary | ICD-10-CM

## 2015-12-19 DIAGNOSIS — K5900 Constipation, unspecified: Secondary | ICD-10-CM | POA: Diagnosis not present

## 2015-12-19 DIAGNOSIS — R52 Pain, unspecified: Secondary | ICD-10-CM

## 2015-12-19 DIAGNOSIS — E785 Hyperlipidemia, unspecified: Secondary | ICD-10-CM | POA: Diagnosis not present

## 2015-12-19 DIAGNOSIS — R1013 Epigastric pain: Secondary | ICD-10-CM | POA: Insufficient documentation

## 2015-12-19 DIAGNOSIS — M542 Cervicalgia: Secondary | ICD-10-CM | POA: Diagnosis not present

## 2015-12-19 DIAGNOSIS — Z79899 Other long term (current) drug therapy: Secondary | ICD-10-CM | POA: Diagnosis not present

## 2015-12-19 DIAGNOSIS — R109 Unspecified abdominal pain: Secondary | ICD-10-CM

## 2015-12-19 LAB — CBC WITH DIFFERENTIAL/PLATELET
BASOS ABS: 0 10*3/uL (ref 0.0–0.1)
Basophils Relative: 0 %
EOS PCT: 0 %
Eosinophils Absolute: 0 10*3/uL (ref 0.0–0.7)
HEMATOCRIT: 43.3 % (ref 36.0–46.0)
Hemoglobin: 15 g/dL (ref 12.0–15.0)
LYMPHS ABS: 1.7 10*3/uL (ref 0.7–4.0)
LYMPHS PCT: 24 %
MCH: 30.4 pg (ref 26.0–34.0)
MCHC: 34.6 g/dL (ref 30.0–36.0)
MCV: 87.8 fL (ref 78.0–100.0)
Monocytes Absolute: 0.5 10*3/uL (ref 0.1–1.0)
Monocytes Relative: 7 %
NEUTROS ABS: 4.9 10*3/uL (ref 1.7–7.7)
Neutrophils Relative %: 69 %
PLATELETS: 314 10*3/uL (ref 150–400)
RBC: 4.93 MIL/uL (ref 3.87–5.11)
RDW: 13.2 % (ref 11.5–15.5)
WBC: 7.1 10*3/uL (ref 4.0–10.5)

## 2015-12-19 LAB — COMPREHENSIVE METABOLIC PANEL
ALBUMIN: 4.3 g/dL (ref 3.5–5.0)
ALK PHOS: 57 U/L (ref 38–126)
ALT: 17 U/L (ref 14–54)
AST: 17 U/L (ref 15–41)
Anion gap: 11 (ref 5–15)
BUN: 8 mg/dL (ref 6–20)
CALCIUM: 9.4 mg/dL (ref 8.9–10.3)
CHLORIDE: 101 mmol/L (ref 101–111)
CO2: 22 mmol/L (ref 22–32)
CREATININE: 0.78 mg/dL (ref 0.44–1.00)
GFR calc Af Amer: >60 mL/min (ref >60)
GFR calc non Af Amer: >60 mL/min (ref >60)
GLUCOSE: 110 mg/dL — AB (ref 65–99)
Potassium: 3.6 mmol/L (ref 3.5–5.1)
SODIUM: 134 mmol/L — AB (ref 135–145)
Total Bilirubin: 0.8 mg/dL (ref 0.3–1.2)
Total Protein: 7.3 g/dL (ref 6.5–8.1)

## 2015-12-19 LAB — LACTIC ACID, PLASMA: LACTIC ACID, VENOUS: 1.1 mmol/L (ref 0.5–2.0)

## 2015-12-19 LAB — LIPASE, BLOOD: Lipase: 22 U/L (ref 11–51)

## 2015-12-19 MED ORDER — SODIUM CHLORIDE 0.9 % IV SOLN: INTRAVENOUS | Status: DC

## 2015-12-19 MED ORDER — GI COCKTAIL ~~LOC~~
30.0000 mL | Freq: Once | ORAL | Status: DC
  Filled 2015-12-19: qty 30

## 2015-12-19 MED ORDER — PROMETHAZINE HCL 25 MG/ML IJ SOLN
12.5000 mg | Freq: Once | INTRAMUSCULAR | Status: AC
  Administered 2015-12-19: 12.5 mg via INTRAVENOUS
  Filled 2015-12-19: qty 1

## 2015-12-19 MED ORDER — METOCLOPRAMIDE HCL 5 MG/ML IJ SOLN
10.0000 mg | Freq: Once | INTRAMUSCULAR | Status: AC
  Administered 2015-12-19: 10 mg via INTRAVENOUS
  Filled 2015-12-19: qty 2

## 2015-12-19 MED ORDER — DIATRIZOATE MEGLUMINE & SODIUM 66-10 % PO SOLN
ORAL | Status: AC
  Administered 2015-12-19: 30 mL
  Filled 2015-12-19: qty 30

## 2015-12-19 MED ORDER — PROMETHAZINE HCL 25 MG RE SUPP: 25.0000 mg | Freq: Every day | RECTAL | Status: DC | PRN

## 2015-12-19 MED ORDER — PROMETHAZINE HCL 25 MG PO TABS: 25.0000 mg | ORAL_TABLET | Freq: Four times a day (QID) | ORAL | Status: DC | PRN

## 2015-12-19 MED ORDER — IOPAMIDOL (ISOVUE-300) INJECTION 61%
100.0000 mL | Freq: Once | INTRAVENOUS | Status: AC | PRN
  Administered 2015-12-19: 100 mL via INTRAVENOUS

## 2015-12-19 MED ORDER — SODIUM CHLORIDE 0.9 % IV BOLUS (SEPSIS)
1000.0000 mL | Freq: Once | INTRAVENOUS | Status: AC
  Administered 2015-12-19: 1000 mL via INTRAVENOUS

## 2015-12-19 NOTE — ED Notes (Signed)
EDP at bedside updating patient and family. 

## 2015-12-19 NOTE — ED Notes (Signed)
Pt given Sprite to drink. Pt tolerating well at this time.  

## 2015-12-19 NOTE — ED Provider Notes (Signed)
CSN: [id number]     Arrival date & time 12/19/15  0815 History   By signing my name below, I, Eustaquio Maize, attest that this documentation has been prepared under the direction and in the presence of Merrily Pew, MD. Electronically Signed: Eustaquio Maize, ED Scribe. 12/19/2015. 8:55 AM.    Chief Complaint  Patient presents with  . Abdominal Pain   The history is provided by the patient. No language interpreter was used.  HPI Comments: Kylie Tucker is a 65 y.o. female who presents to the Emergency Department complaining of nausea, vomiting, and burning epigastric abdominal pain that began this morning. Pt describes emesis as yellow and foaming.Pt was here yesterday and was discharged, but states symptoms have returned. Pt reports that she took a blood pressure pill 4 hours ago and described lighting pain in her neck and throat. Pt also reports difficulty swallowing and weakness in legs. She has not taken anything for her symptoms. Last bowel movement yesterday prior to endoscopy. Pt denies rashes or diarrhea.  Per chart review: Pt was seen in ED on 12/16/2015 for recurrent abdominal pain, nausea, and vomiting. Her labs showed no acute findings at that time. She was seen again on 12/18/2015 s/p endoscopy for continuation of abdominal pain. Pt had blood work and abdominal series done with findings of gas distended small bowel loops without obstruction.    Past Medical History  Diagnosis Date  . Cellulitis and abscess of upper arm and forearm   . Postmastectomy lymphedema rt side  . History of right mastectomy 2004    chemo/ radiation  . Migraines   . Cancer ([place]) rt breast    2004/ chemo/ radiation  . Allergy   . Hyperlipidemia   . PONV (postoperative nausea and vomiting)     cannot take zofran  . GERD (gastroesophageal reflux disease)   . Sentinel node   . Anxiety   . Left bundle branch block 2016  . Hypertension   . H/O Helicobacter infection 2017   Past Surgical History   Procedure Laterality Date  . Rt mastectomy  2004    lymph nodes-7-axillary node dissection  . Nasal sinus surgery  1990/2001  . Mandible surgery  1991  . Port-a-cath removal      in and now out  . Nasal sinus surgery    . Mouth surgery    . Orif finger fracture  02/14/2012    Procedure: OPEN REDUCTION INTERNAL FIXATION (ORIF) METACARPAL (FINGER) FRACTURE;  Surgeon: Wynonia Sours, MD;  Location: [place];  Service: Orthopedics;  Laterality: Right;  RIGHT FIFTH   . Breast lumpectomy  2004     br bx  . Orif metacarpal fracture  8/13    rt   . Repair extensor tendon Left 05/30/2013    Procedure: RELEASE TRANSPOSITION EXTENSOR POLLICUS LONGUS LEFT WRIST;  Surgeon: Wynonia Sours, MD;  Location: [place];  Service: Orthopedics;  Laterality: Left;  . Breast lumpectomy with axillary lymph node dissection    . Carpal tunnel release    . Wrist arthroscopy Left 2014  . Shoulder arthroscopy with rotator cuff repair and subacromial decompression Left 11/22/2013    Procedure: LEFT SHOULDER ARTHROSCOPY WITH SUBACROMIAL DECOMPRESSION DISTAL CLAVICLE RESECTION REPAIR  ROTATOR CUFF AS NEEDED ;  Surgeon: Cammie Sickle., MD;  Location: [place];  Service: Orthopedics;  Laterality: Left;  . Cataract extraction Left 07/12/2013   Family History  Problem Relation Age of Onset  .  Heart disease Father   . Cancer Maternal Aunt     breast  . Heart disease Paternal Uncle   . Heart disease Paternal Grandmother   . Heart disease Paternal Grandfather   . Heart disease Mother   . Hypertension Mother   . Diabetes Brother    Social History  Substance Use Topics  . Smoking status: Never Smoker   . Smokeless tobacco: Never Used  . Alcohol Use: No   OB History    Gravida Para Term Preterm AB TAB SAB Ectopic Multiple Living   3 2        1      Review of Systems  Constitutional: Positive for chills.  Gastrointestinal: Positive for nausea, vomiting, abdominal  pain and constipation. Negative for diarrhea.  Musculoskeletal: Positive for neck pain.  Neurological: Positive for weakness.  All other systems reviewed and are negative.     Allergies  Acetaminophen; Amoxicillin-pot clavulanate; Azithromycin; Cefuroxime axetil; Cephalexin; Hydrocodone-acetaminophen; Hydrocodone-acetaminophen; Levofloxacin; Morphine and related; Ondansetron; Propoxyphene n-acetaminophen; Rosuvastatin; Sulfonamide derivatives; Tramadol; Trazodone and nefazodone; Vioxx; Zofran; Clarithromycin; Codeine; and Sulfamethoxazole  Home Medications   Prior to Admission medications   Medication Sig Start Date End Date Taking? Authorizing Provider  ALPRAZolam Duanne Moron) 0.5 MG tablet Take 0.5 mg by mouth 3 (three) times daily as needed for anxiety.  12/02/15  Yes Historical Provider, MD  calcium carbonate (OS-CAL) 600 MG TABS Take 600 mg by mouth 2 (two) times daily with a meal.     Yes Historical Provider, MD  Cholecalciferol (VITAMIN D) 2000 UNITS CAPS Take 1 capsule by mouth daily.    Yes Historical Provider, MD  dicyclomine (BENTYL) 10 MG capsule Take 1 capsule by mouth as needed for spasms.  11/14/15  Yes Historical Provider, MD  EFFEXOR XR 37.5 MG 24 hr capsule Take 1 capsule (37.5 mg total) by mouth daily with breakfast. Reported on 12/05/2015 12/05/15  Yes Brook E Yisroel Ramming, MD  eletriptan (RELPAX) 40 MG tablet Take 40 mg by mouth as needed for migraine or headache. Reported on 10/20/2015   Yes Historical Provider, MD  ezetimibe (ZETIA) 10 MG tablet Take 10 mg by mouth at bedtime.    Yes Historical Provider, MD  Multiple Vitamins-Minerals (MULTIVITAMINS THER. W/MINERALS) TABS Take 1 tablet by mouth daily.     Yes Historical Provider, MD  nebivolol (BYSTOLIC) 5 MG tablet Take 1 tablet (5 mg total) by mouth daily. 11/25/15  Yes Thayer Headings, MD  Olopatadine HCl (PATADAY) 0.2 % SOLN Place 1 drop into both eyes as needed.    Yes Historical Provider, MD  omeprazole (PRILOSEC) 20 MG  capsule Take 1 capsule (20 mg total) by mouth daily. 12/18/15  Yes Noemi Chapel, MD  Probiotic Product (PROBIOTIC DAILY PO) Take by mouth daily.    Yes Historical Provider, MD  ALPRAZOLAM XR 2 MG 24 hr tablet Take 2 mg by mouth 2 (two) times daily. 09/27/15   Historical Provider, MD  promethazine (PHENERGAN) 25 MG suppository Place 1 suppository (25 mg total) rectally daily as needed. 12/19/15   Merrily Pew, MD  promethazine (PHENERGAN) 25 MG tablet Take 1 tablet (25 mg total) by mouth every 6 (six) hours as needed for nausea or vomiting. 12/19/15   Merrily Pew, MD   BP 149/71 mmHg  Pulse 75  Temp(Src) 97.9 F (36.6 C) (Oral)  Resp 14  Ht 5\' 2"  (1.575 m)  Wt 122 lb (55.339 kg)  BMI 22.31 kg/m2  SpO2 97%  LMP 01/10/2003   Physical  Exam  Constitutional: She is oriented to person, place, and time. She appears well-developed and well-nourished. No distress.  HENT:  Head: Normocephalic and atraumatic.  DMM  Eyes: Conjunctivae and EOM are normal.  Neck: Neck supple. No tracheal deviation present.  Cardiovascular: Normal rate, regular rhythm and normal heart sounds.   Pulmonary/Chest: Effort normal and breath sounds normal. No respiratory distress. She has no wheezes. She has no rales.  Abdominal: Soft. Bowel sounds are normal. She exhibits no distension. There is tenderness in the right upper quadrant and epigastric area.  Musculoskeletal: Normal range of motion.  Neurological: She is alert and oriented to person, place, and time.  Skin: Skin is warm and dry.  Psychiatric: She has a normal mood and affect. Her behavior is normal.  Nursing note and vitals reviewed.   ED Course  Procedures  DIAGNOSTIC STUDIES: Oxygen Saturation is 98% on RA, normal by my interpretation.    COORDINATION OF CARE: 8:42 AM-Discussed treatment plan which includes CT A/P scan with pt at bedside and pt agreed to plan.   Labs Review Labs Reviewed  COMPREHENSIVE METABOLIC PANEL - Abnormal; Notable for the  following:    Sodium 134 (*)    Glucose, Bld 110 (*)    All other components within normal limits  LIPASE, BLOOD  LACTIC ACID, PLASMA  CBC WITH DIFFERENTIAL/PLATELET    Imaging Review US Transvaginal Non-ob  12/19/2015  CLINICAL DATA:  Right adnexal mass seen by CT in a patient with history of treated breast cancer. EXAM: TRANSABDOMINAL AND TRANSVAGINAL ULTRASOUND OF PELVIS TECHNIQUE: Both transabdominal and transvaginal ultrasound examinations of the pelvis were performed. Transabdominal technique was performed for global imaging of the pelvis including uterus, ovaries, adnexal regions, and pelvic cul-de-sac. It was necessary to proceed with endovaginal exam following the transabdominal exam to visualize the adnexa. COMPARISON:  CT of the abdomen pelvis 12/19/2015 FINDINGS: Uterus Measurements: 4.7 x 2.3 x 3.6 cm. No fibroids or other mass visualized. Endometrium Thickness: 2.7 mm.  No focal abnormality visualized. Right ovary Measurements: 2.8 x 2.3 x 2.2 cm. Right ovarian volume equals 7.2 cubic cm. It contains 2 cystic structures measuring 1.8 x 1.0 x 2.0 cm and 0.9 x 0.9 x 1.1 cm. Left ovary Measurements: 2.4 x 1.4 x 2.6 cm. Left ovarian volume equals 4.6 cubic cm. Normal appearance/no adnexal mass. Other findings No abnormal free fluid. IMPRESSION: Two less than 2 cm cystic structures on the right ovary, which may be physiologic, however follow-up with pelvic ultrasound in 3-6 months is recommended to assure stability or resolution. Electronically Signed   By: Fidela Salisbury M.D.   On: 12/19/2015 12:46   US Pelvis Complete  12/19/2015  CLINICAL DATA:  The patient presented to the emergency room with nausea and vomiting. A possible right adnexal mass was identified on CT from today. EXAM: TRANSABDOMINAL AND TRANSVAGINAL ULTRASOUND OF PELVIS TECHNIQUE: Both transabdominal and transvaginal ultrasound examinations of the pelvis were performed. Transabdominal technique was performed for global  imaging of the pelvis including uterus, ovaries, adnexal regions, and pelvic cul-de-sac. It was necessary to proceed with endovaginal exam following the transabdominal exam to visualize the ovaries and endometrium. COMPARISON:  CT scan from today and ultrasound of the pelvis from Nov 20, 2013 FINDINGS: Uterus Measurements: 4.7 x 2.3 x 3.6 cm. No fibroids or other mass visualized. Endometrium Thickness: 2.7 mm.  No focal abnormality visualized. Right ovary Measurements: 2.8 x 2.3 x 2.2 cm. Multiple small cystic areas are seen in the right ovary. It is  unclear whether this is 1 dominant multi septated cyst or several individual cysts. The largest single cystic component measures 1.8 x 1.0 x 2.0 cm. If taken as 1 a multi septated cystic mass, the mass would measure 2.2 x 1.6 cm. The previous study demonstrated multiple small sub cm cysts in the right ovary. Left ovary Measurements: 2.4 x 1.4 x 2.6 cm. A single 9 x 9 x 11 mm cyst is seen in the left ovary. Other findings No abnormal free fluid. IMPRESSION: 1. There are a either multiple adjacent cysts in the right ovary or 1 dominant multi septated cyst measuring 2.2 x 1.6 cm. Based on the previous study, I suspect there are multiple cysts rather than 1 septated cyst but the findings are unclear. Given the possibility of multiple thin septations within a single cystic mass, recommend gynecologic consultation. 2. There is a single 11 mm simple cyst in the left ovary requiring a follow-up ultrasound in 1 year. 3. No other abnormalities. Electronically Signed   By: Dorise Bullion III M.D   On: 12/19/2015 12:54   Ct Abdomen Pelvis W Contrast  12/19/2015  CLINICAL DATA:  Upper abdominal pain and nausea. History of treated right breast cancer in 2004. EXAM: CT ABDOMEN AND PELVIS WITH CONTRAST TECHNIQUE: Multidetector CT imaging of the abdomen and pelvis was performed using the standard protocol following bolus administration of intravenous contrast. CONTRAST:  132mL  ISOVUE-300 IOPAMIDOL (ISOVUE-300) INJECTION 61% COMPARISON:  CT of the abdomen pelvis 12/07/2015 FINDINGS: Lower chest:  No acute findings.  Post right mastectomy. Hepatobiliary: Numerous liver cysts. There is a 14 mm hypoattenuated sharply demarcated subcapsular left hepatic lobe mass which does not satisfy the criteria for simple cyst. Gallbladder has normal appearance. Pancreas: No mass, inflammatory changes, or other significant abnormality. Spleen: Within normal limits in size and appearance. Adrenals/Urinary Tract: There is a 12 mm left adrenal nodule. No evidence of suspicious renal masses. There is a 5 mm nonobstructing right lower pole renal calculus. Sub cm right renal cyst is seen. Stomach/Bowel: No evidence of obstruction, inflammatory process, or abnormal fluid collections. Vascular/Lymphatic: No pathologically enlarged lymph nodes. No evidence of abdominal aortic aneurysm. Reproductive: There is somewhat asymmetrically prominent appearance of the right ovary measuring 2.7 x 2.3 cm. Other: None. Musculoskeletal:  No suspicious bone lesions identified. IMPRESSION: Multiple liver cysts. 14 mm stable in size indeterminate subcapsular left hepatic lobe mass may also represent a cyst or a hemangioma. Attention on future follow-up is recommended. 12 mm left adrenal nodule, which may represent adrenal adenomas, however is incompletely characterized on this single phase study. No evidence of small-bowel subtraction, wall thickening or other inflammatory changes. Nonobstructing right lower pole renal calculus. Somewhat asymmetric masslike appearance of the right adnexa measuring 2.7 cm in maximum dimension. Correlation with pelvic ultrasound may be considered, especially given patient's history of treated breast cancer. Electronically Signed   By: Fidela Salisbury M.D.   On: 12/19/2015 09:58   Dg Abd Acute W/chest  12/18/2015  CLINICAL DATA:  Severe abdominal pain. Status post endoscopy and biopsies today.  EXAM: DG ABDOMEN ACUTE W/ 1V CHEST COMPARISON:  Chest radiographs dated 07/11/2013 and abdomen and pelvis CT dated 12/07/2015. FINDINGS: The cardiac silhouette remains borderline enlarged. Postmastectomy changes on the right with axillary surgical clips. Left nipple shadow. Clear lungs. No pneumomediastinum or pneumothorax. No pleural fluid. Gaseous distention of multiple small bowel loops without pathological dilatation. No free peritoneal air. Mild scoliosis. Lumbar spine degenerative changes. IMPRESSION: 1. Gas distended small bowel loops  without obstruction. 2. Borderline cardiomegaly. Electronically Signed   By: Claudie Revering M.D.   On: 12/18/2015 13:42   I have personally reviewed and evaluated these images and lab results as part of my medical decision-making.   EKG Interpretation   Date/Time:  Friday December 19 2015 09:36:35 EDT Ventricular Rate:  70 PR Interval:  173 QRS Duration: 154 QT Interval:  502 QTC Calculation: 542 R Axis:   -23 Text Interpretation:  Sinus rhythm Probable left atrial enlargement Left  bundle branch block LBBB new since last tracing Confirmed by [facility] MD,  [name] [phone]) on 12/19/2015 9:42:04 AM      MDM   Final diagnoses:  Abdominal pain, unspecified abdominal location  Non-intractable vomiting with nausea, vomiting of unspecified type    65 year old female 2 days for a endoscopy with biopsy here with nausea that improved with Phenergan. Some abdominal pain CT scan done to assure no complications of the endoscopy and this was negative. Patient tolerating by mouth prior to discharge. Repeat abdominal exam was still benign. Will continue following up with gastroenterology for further workup of her symptoms.  New Prescriptions: Discharge Medication List as of 12/19/2015  2:41 PM       I have personally and contemperaneously reviewed labs and imaging and used in my decision making as above.   A medical screening exam was performed and I feel the patient has  had an appropriate workup for their chief complaint at this time and likelihood of emergent condition existing is low and thus workup can continue on an outpatient basis.. Their vital signs are stable. They have been counseled on decision, discharge, follow up and which symptoms necessitate immediate return to the emergency department.  They verbally stated understanding and agreement with plan and discharged in stable condition.    I personally performed the services described in this documentation, which was scribed in my presence. The recorded information has been reviewed and is accurate.     Merrily Pew, MD 12/19/15 1536

## 2015-12-19 NOTE — ED Notes (Signed)
Pt c/o RT sided abdominal pain, n/v, and increase in gas that began early this morning. Pt seen and treated in ED yesterday for same complaint. Pt states when she was d/c, pain was gone, but has now returned. Pt had an upper endoscopy done yesterday morning with [facility] gastrology.

## 2015-12-22 ENCOUNTER — Telehealth: Payer: Self-pay | Admitting: Cardiovascular Disease

## 2015-12-22 ENCOUNTER — Other Ambulatory Visit (HOSPITAL_COMMUNITY): Payer: Self-pay | Admitting: Gastroenterology

## 2015-12-22 ENCOUNTER — Other Ambulatory Visit: Payer: Self-pay | Admitting: Gastroenterology

## 2015-12-22 DIAGNOSIS — E785 Hyperlipidemia, unspecified: Secondary | ICD-10-CM

## 2015-12-22 DIAGNOSIS — R1011 Right upper quadrant pain: Secondary | ICD-10-CM

## 2015-12-22 NOTE — Telephone Encounter (Signed)
New Message:  Pt called in requesting another medication in the place of Zetia because it is becoming to expensive. Please f/u with her.

## 2015-12-22 NOTE — Telephone Encounter (Signed)
Agree with repeating lab work and will go from there

## 2015-12-22 NOTE — Telephone Encounter (Signed)
Spoke with patient who called to ask about an alternative for zetia due to she is signing up for medicare in July.  I explained that it is different than a statin drug and advised that the cost should be lower soon as the medication is going generic.  After reviewing her chart, I asked her who originally prescribed the medication and she states she thinks it was Dr. Aundra Dubin many years ago.  We do not have recent lipid levels on her.  I scheduled her for fasting lab work tomorrow and advised that we will determine if the medication is still needed after lab results are available.  She verbalized understanding and agreement.

## 2015-12-23 ENCOUNTER — Telehealth: Payer: Self-pay | Admitting: Obstetrics and Gynecology

## 2015-12-23 ENCOUNTER — Other Ambulatory Visit: Payer: Commercial Managed Care - HMO

## 2015-12-23 NOTE — Telephone Encounter (Signed)
Patient says she is having problems sleeping and is taking effexor.

## 2015-12-23 NOTE — Telephone Encounter (Signed)
It is possible her Effexor is contributing to or causing her insomnia. She could consider coming off of the Effexor. Typically you should wean off of the Effexor. She is on the lowest dose of Effexor and should be able to stop it. On review of her chart, she has depression and anxiety and is on many medications.  I would recommend she speak with her primary, consider consultation with a psychiatrist to help with medication management. Her primary has much more experience with insomnia and medications for insomnia.  If she is having significant vasomotor symptoms she should make an appointment to come in and speak with Dr Quincy Simmonds.

## 2015-12-23 NOTE — Telephone Encounter (Signed)
Spoke with patient. Advised of message as seen below from [place]. Patient states that she is also experiencing increased hot flashes and night sweats that are keeping her up at night. "Last night it was terrible. They started at 2 am and did not stop." Advised patient she will need to be seen in the office for further evaluation of vasomotor symptoms, but will need to be seen with her PCP or consider consultation with a psychiatrist to help with insomnia medication management. Patient verbalizes understanding. Appointment scheduled for 12/24/2015 at 1:30 pm with Dr.Silva. She is agreeable to date and time.  Cc: Dr.Silva  Routing to covering provider for final review. Patient agreeable to disposition. Will close encounter.

## 2015-12-23 NOTE — Telephone Encounter (Signed)
Spoke with patient. Patient is currently taking Effexor 37.5 mg daily. Reports she is taking the medication in the morning and is unable to sleep at night. Reports she is unable to fall asleep. "I am not sleeping at all." States this has been going on for a while. "I am tired, but I cannot sleep." Reports this started after taking an antibiotic for a "bacterial" infection prescribed by another physician. Reports her PCP has tired her on Trazodone and Rozerem, but she is unable to take these due to side effects. Patient is very concerned and worked up about not sleeping. "Someone has got to help me." Advised I will speak with Dr.Silva or covering provider regarding her recommendations and return call. She is agreeable and verbalizes understanding.  Cc: Dr.Jertson for review as Dr.Silva is out of the office

## 2015-12-24 ENCOUNTER — Ambulatory Visit: Payer: Commercial Managed Care - HMO | Admitting: Obstetrics and Gynecology

## 2015-12-25 ENCOUNTER — Other Ambulatory Visit (INDEPENDENT_AMBULATORY_CARE_PROVIDER_SITE_OTHER): Payer: Commercial Managed Care - HMO | Admitting: *Deleted

## 2015-12-25 DIAGNOSIS — E785 Hyperlipidemia, unspecified: Secondary | ICD-10-CM | POA: Diagnosis not present

## 2015-12-25 LAB — COMPREHENSIVE METABOLIC PANEL
ALT: 15 U/L (ref 6–29)
AST: 15 U/L (ref 10–35)
Albumin: 3.9 g/dL (ref 3.6–5.1)
Alkaline Phosphatase: 55 U/L (ref 33–130)
BILIRUBIN TOTAL: 0.5 mg/dL (ref 0.2–1.2)
BUN: 10 mg/dL (ref 7–25)
CO2: 30 mmol/L (ref 20–31)
CREATININE: 0.87 mg/dL (ref 0.50–0.99)
Calcium: 9.1 mg/dL (ref 8.6–10.4)
Chloride: 103 mmol/L (ref 98–110)
GLUCOSE: 102 mg/dL — AB (ref 65–99)
Potassium: 4.2 mmol/L (ref 3.5–5.3)
SODIUM: 139 mmol/L (ref 135–146)
Total Protein: 5.9 g/dL — ABNORMAL LOW (ref 6.1–8.1)

## 2015-12-25 LAB — LIPID PANEL
CHOL/HDL RATIO: 3.9 ratio (ref <=5.0)
Cholesterol: 186 mg/dL (ref 125–200)
HDL: 48 mg/dL (ref >=46)
LDL Cholesterol: 117 mg/dL (ref <130)
Triglycerides: 103 mg/dL (ref <150)
VLDL: 21 mg/dL (ref <30)

## 2015-12-26 ENCOUNTER — Telehealth: Payer: Self-pay | Admitting: Cardiovascular Disease

## 2015-12-26 ENCOUNTER — Encounter: Payer: Self-pay | Admitting: Obstetrics and Gynecology

## 2015-12-26 ENCOUNTER — Ambulatory Visit (INDEPENDENT_AMBULATORY_CARE_PROVIDER_SITE_OTHER): Payer: Commercial Managed Care - HMO | Admitting: Obstetrics and Gynecology

## 2015-12-26 VITALS — BP 118/70 | HR 80 | Resp 16 | Wt 117.6 lb

## 2015-12-26 DIAGNOSIS — F32A Depression, unspecified: Secondary | ICD-10-CM

## 2015-12-26 DIAGNOSIS — F329 Major depressive disorder, single episode, unspecified: Secondary | ICD-10-CM | POA: Diagnosis not present

## 2015-12-26 DIAGNOSIS — G4700 Insomnia, unspecified: Secondary | ICD-10-CM | POA: Diagnosis not present

## 2015-12-26 DIAGNOSIS — N83201 Unspecified ovarian cyst, right side: Secondary | ICD-10-CM

## 2015-12-26 DIAGNOSIS — E278 Other specified disorders of adrenal gland: Secondary | ICD-10-CM

## 2015-12-26 DIAGNOSIS — F411 Generalized anxiety disorder: Secondary | ICD-10-CM | POA: Diagnosis not present

## 2015-12-26 DIAGNOSIS — N83202 Unspecified ovarian cyst, left side: Secondary | ICD-10-CM

## 2015-12-26 DIAGNOSIS — E279 Disorder of adrenal gland, unspecified: Secondary | ICD-10-CM | POA: Diagnosis not present

## 2015-12-26 LAB — THYROID PANEL WITH TSH
FREE THYROXINE INDEX: 3.1 (ref 1.4–3.8)
T3 UPTAKE: 37 % — AB (ref 22–35)
T4, Total: 8.4 ug/dL (ref 4.5–12.0)
TSH: 2.36 mIU/L

## 2015-12-26 NOTE — Telephone Encounter (Signed)
Pt calling to get results from lab work (done 12-25-15) pls call

## 2015-12-26 NOTE — Telephone Encounter (Signed)
Spoke with patient and reviewed lab results.  Patient states although zetia is on her list of medications, she has not been taking it due to problems with her digestive system.  She would like to stay off of it.  I advised her that she can discuss further with Dr. Acie Fredrickson at office visit on 7/13.  She verbalized understanding and agreement.

## 2015-12-26 NOTE — Progress Notes (Signed)
GYNECOLOGY  VISIT   HPI: 65 y.o.   Married  Caucasian  female   G3P2 with Patient's last menstrual period was 01/10/2003.   here for vasomotor issues. Pt states can't take Effexor because she says it keeps her awake.   Was doing well with Effexor prior to being tx with abx for H Pylori.  Has been on Effexor since 2004.  Unable to sleep now.  Patient has taken the extended release Xanax in the past, which she states works well.  Dr. Gerarda Fraction, her PCP, gave her regular Xanax to take tid.  She states this does not work well.  She is dealing with upper abdominal pain, nausea, vomiting, and weight loss and has had repeat visits to the emergency department over the last 2+ weeks.  She has had an endoscopy and imaging studies as well.  I was unaware of the imaging studies but starting doing research in [facility] while she was in front of me during this office visit.  Her CT scan on 12/19/15 showed liver cysts, a 12 mm left adrenal nodule and 2.7 cm right ovarian cyst.  Pelvic ultrasound on 12/19/15 showed:   CLINICAL DATA: Right adnexal mass seen by CT in a patient with history of treated breast cancer.  EXAM: TRANSABDOMINAL AND TRANSVAGINAL ULTRASOUND OF PELVIS  TECHNIQUE: Both transabdominal and transvaginal ultrasound examinations of the pelvis were performed. Transabdominal technique was performed for global imaging of the pelvis including uterus, ovaries, adnexal regions, and pelvic cul-de-sac. It was necessary to proceed with endovaginal exam following the transabdominal exam to visualize the adnexa.  COMPARISON: CT of the abdomen pelvis 12/19/2015  FINDINGS: Uterus  Measurements: 4.7 x 2.3 x 3.6 cm. No fibroids or other mass visualized.  Endometrium  Thickness: 2.7 mm. No focal abnormality visualized.  Right ovary  Measurements: 2.8 x 2.3 x 2.2 cm. Right ovarian volume equals 7.2 cubic cm. It contains 2 cystic structures measuring 1.8 x 1.0 x 2.0 cm and 0.9 x 0.9 x  1.1 cm.  Left ovary  Measurements: 2.4 x 1.4 x 2.6 cm. Left ovarian volume equals 4.6 cubic cm. Normal appearance/no adnexal mass.  Other findings  No abnormal free fluid.  IMPRESSION: Two less than 2 cm cystic structures on the right ovary, which may be physiologic, however follow-up with pelvic ultrasound in 3-6 months is recommended to assure stability or resolution.   Electronically Signed  By: Fidela Salisbury M.D.  On: 12/19/2015 12:46  Dx with left BBB in March.   Has a nuclear scan coming up of the gall bladder.   GYNECOLOGIC HISTORY: Patient's last menstrual period was 01/10/2003. Contraception:  Post-Menopausal Menopausal hormone therapy:  None Last mammogram:  04/04/15 BIRADS1 Last pap smear:   09/26/15-WNL        OB History    Gravida Para Term Preterm AB TAB SAB Ectopic Multiple Living   3 2        1          Patient Active Problem List   Diagnosis Date Noted  . Chest pain 09/23/2015  . Abnormal vision 06/13/2015  . Anxiety disorder 06/13/2015  . Cataract cortical, senile 06/13/2015  . Clinical depression 06/13/2015  . Cephalalgia 06/13/2015  . H/O malignant neoplasm of breast 06/13/2015  . 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 Sinus infection 06/13/2015  . Long term current use of systemic steroids 06/13/2015  . Chronic systolic CHF (congestive heart failure), NYHA class 1 ([place]) 05/09/2015  . LBBB (left bundle branch block) 03/26/2015  . Lymphedema  of arm 12/13/2013  . Complete rupture of rotator cuff 11/22/2013  . Nipple discharge in female 12/01/2012  . History of breast cancer 11/30/2012  . Epiphora 11/28/2012  . Disorder of endocrine system 04/25/2012  . Ceratitis 04/25/2012  . Blepharitis 04/25/2012  . Breast cancer, right breast ([place])   . FATIGUE 10/01/2009  . 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 Dyspnea 10/01/2009  . HYPERLIPIDEMIA 09/30/2009    Past Medical History  Diagnosis Date  . Cellulitis and abscess of upper arm and forearm    . Postmastectomy lymphedema rt side  . History of right mastectomy 2004    chemo/ radiation  . Migraines   . Cancer ([place]) rt breast    2004/ chemo/ radiation  . Allergy   . Hyperlipidemia   . PONV (postoperative nausea and vomiting)     cannot take zofran  . GERD (gastroesophageal reflux disease)   . Sentinel node   . Anxiety   . Left bundle branch block 2016  . Hypertension   . H/O Helicobacter infection 2017    Past Surgical History  Procedure Laterality Date  . Rt mastectomy  2004    lymph nodes-7-axillary node dissection  . Nasal sinus surgery  1990/2001  . Mandible surgery  1991  . Port-a-cath removal      in and now out  . Nasal sinus surgery    . Mouth surgery    . Orif finger fracture  02/14/2012    Procedure: OPEN REDUCTION INTERNAL FIXATION (ORIF) METACARPAL (FINGER) FRACTURE;  Surgeon: Wynonia Sours, MD;  Location: [place];  Service: Orthopedics;  Laterality: Right;  RIGHT FIFTH   . Breast lumpectomy  2004     br bx  . Orif metacarpal fracture  8/13    rt   . Repair extensor tendon Left 05/30/2013    Procedure: RELEASE TRANSPOSITION EXTENSOR POLLICUS LONGUS LEFT WRIST;  Surgeon: Wynonia Sours, MD;  Location: [facility];  Service: Orthopedics;  Laterality: Left;  . Breast lumpectomy with axillary lymph node dissection    . Carpal tunnel release    . Wrist arthroscopy Left 2014  . Shoulder arthroscopy with rotator cuff repair and subacromial decompression Left 11/22/2013    Procedure: LEFT SHOULDER ARTHROSCOPY WITH SUBACROMIAL DECOMPRESSION DISTAL CLAVICLE RESECTION REPAIR  ROTATOR CUFF AS NEEDED ;  Surgeon: Cammie Sickle., MD;  Location: [facility];  Service: Orthopedics;  Laterality: Left;  . Cataract extraction Left 07/12/2013    Current Outpatient Prescriptions  Medication Sig Dispense Refill  . ALPRAZolam (XANAX) 0.5 MG tablet Take 0.5 mg by mouth 3 (three) times daily as needed for anxiety.     . ALPRAZOLAM  XR 2 MG 24 hr tablet Take 2 mg by mouth 2 (two) times daily.    . calcium carbonate (OS-CAL) 600 MG TABS Take 600 mg by mouth 2 (two) times daily with a meal.      . Cholecalciferol (VITAMIN D) 2000 UNITS CAPS Take 1 capsule by mouth daily.     . EFFEXOR XR 37.5 MG 24 hr capsule Take 1 capsule (37.5 mg total) by mouth daily with breakfast. Reported on 12/05/2015 30 capsule 10  . eletriptan (RELPAX) 40 MG tablet Take 40 mg by mouth as needed for migraine or headache. Reported on 10/20/2015    . Multiple Vitamins-Minerals (MULTIVITAMINS THER. W/MINERALS) TABS Take 1 tablet by mouth daily.      . nebivolol (BYSTOLIC) 5 MG tablet Take 1 tablet (5 mg total)  by mouth daily. 30 tablet 11  . Olopatadine HCl (PATADAY) 0.2 % SOLN Place 1 drop into both eyes as needed.     . Probiotic Product (PROBIOTIC DAILY PO) Take by mouth daily.     . [DISCONTINUED] metoCLOPramide (REGLAN) 10 MG tablet Take 1 tablet (10 mg total) by mouth 3 (three) times daily as needed for nausea (headache / nausea). (Patient not taking: Reported on 12/19/2015) 20 tablet 0   No current facility-administered medications for this visit.     ALLERGIES: Acetaminophen; Amoxicillin-pot clavulanate; Azithromycin; Cefuroxime axetil; Cephalexin; Hydrocodone-acetaminophen; Hydrocodone-acetaminophen; Levofloxacin; Morphine and related; Ondansetron; Propoxyphene n-acetaminophen; Rosuvastatin; Sulfonamide derivatives; Tramadol; Trazodone and nefazodone; Vioxx; Zofran; Clarithromycin; Codeine; and Sulfamethoxazole  Family History  Problem Relation Age of Onset  . Heart disease Father   . Cancer Maternal Aunt     breast  . Heart disease Paternal Uncle   . Heart disease Paternal Grandmother   . Heart disease Paternal Grandfather   . Heart disease Mother   . Hypertension Mother   . Diabetes Brother     Social History   Social History  . Marital Status: Married    Spouse Name: N/A  . Number of Children: N/A  . Years of Education: N/A    Occupational History  . Not on file.   Social History Main Topics  . Smoking status: Never Smoker   . Smokeless tobacco: Never Used  . Alcohol Use: No  . Drug Use: No  . Sexual Activity:    Partners: Male    Birth Control/ Protection: Post-menopausal   Other Topics Concern  . Not on file   Social History Narrative    ROS:  Pertinent items are noted in HPI.  PHYSICAL EXAMINATION:    BP 118/70 mmHg  Pulse 80  Resp 16  Wt 117 lb 9.6 oz (53.343 kg)  LMP 01/10/2003    General appearance: alert, cooperative and appears stated age   ASSESSMENT  Insomnia.  Vasomotor symptoms. Anxiety and depression.  Upper abdominal pain with nausea, vomiting, and weight loss. Recent H Pylori.  Right adnexal mass.  PLAN  Will check thyroid function and CA125 today.  Plan for a repeat pelvic ultrasound in 3 months provided the CA125 is normal.  May continue on the Effexor.  It may be contributing to the insomnia, but patient has really be on this for a long period of time in the past.  I would like for her to see psychiatry for prescribing assistance for her anxiety and depression.  She is seeing Marya Amsler at [place]. She will follow up with GI regarding her abdominal pain and GI symptoms.      An After Visit Summary was printed and given to the patient.  __25____ minutes face to face time of which over 50% was spent in counseling.

## 2015-12-29 ENCOUNTER — Encounter: Payer: Self-pay | Admitting: Obstetrics and Gynecology

## 2015-12-29 ENCOUNTER — Telehealth: Payer: Self-pay | Admitting: Obstetrics and Gynecology

## 2015-12-29 LAB — CA 125: CA 125: 6 U/mL (ref <35)

## 2015-12-29 NOTE — Telephone Encounter (Signed)
Patient is calling for her recent lab results. °

## 2015-12-29 NOTE — Telephone Encounter (Signed)
Patient calling for CA125 test result. Advised patient this result is still pending. Will notify her as soon a available and reviewed by Dr Quincy Simmonds. Currently result is not available from lab yet.

## 2015-12-30 ENCOUNTER — Encounter: Payer: Self-pay | Admitting: Obstetrics and Gynecology

## 2015-12-30 NOTE — Telephone Encounter (Signed)
Dr Quincy Simmonds has sent patient My Chart message now with normal CA 125 results. Encounter closed.

## 2015-12-31 ENCOUNTER — Ambulatory Visit (HOSPITAL_COMMUNITY): Payer: Commercial Managed Care - HMO

## 2016-01-05 ENCOUNTER — Ambulatory Visit: Payer: Commercial Managed Care - HMO | Admitting: Licensed Clinical Social Worker

## 2016-01-06 ENCOUNTER — Telehealth: Payer: Self-pay | Admitting: Cardiovascular Disease

## 2016-01-06 ENCOUNTER — Other Ambulatory Visit: Payer: Self-pay | Admitting: *Deleted

## 2016-01-06 MED ORDER — NEBIVOLOL HCL 5 MG PO TABS: 5.0000 mg | ORAL_TABLET | Freq: Every day | ORAL | Status: DC

## 2016-01-06 NOTE — Telephone Encounter (Signed)
°*  STAT* If patient is at the pharmacy, call can be transferred to refill team.   1. Which medications need to be refilled? (please list name of each medication and dose if known) Bystolic-need this before her insurance expires on 01-09-16-please call today if possible  2. Which pharmacy/location (including street and city if local pharmacy) is medication to be sent to?[facility]-[phone] or [phone]  3. Do they need a 30 day or 90 day supply? 90 days

## 2016-01-12 ENCOUNTER — Other Ambulatory Visit: Payer: Self-pay | Admitting: Obstetrics and Gynecology

## 2016-01-12 NOTE — Telephone Encounter (Signed)
Patient is asking for a new prescription of EFFEXOR XR 37.5 MG 24 hr capsule to be faxed to [facility] mail order pharmacy  fx  [phone] and ph [phone]. Patient would like a 90 day supply.

## 2016-01-14 MED ORDER — EFFEXOR XR 37.5 MG PO CP24: 37.5000 mg | ORAL_CAPSULE | Freq: Every day | ORAL | Status: DC

## 2016-01-14 NOTE — Telephone Encounter (Signed)
Medication refill request: Effexor Last AEX:  09/10/14 PG Next AEX: 10/14/16 Last MMG (if hormonal medication request): 04/07/15 BIRADS1 R Mastectomy Refill authorized: 12/05/15 #30 10R. Pt needs new Rx sent to [facility]. Please advise. Thank you.

## 2016-01-20 ENCOUNTER — Telehealth: Payer: Self-pay | Admitting: Obstetrics and Gynecology

## 2016-01-20 MED ORDER — VENLAFAXINE HCL ER 37.5 MG PO CP24: 37.5000 mg | ORAL_CAPSULE | Freq: Every day | ORAL | Status: DC

## 2016-01-20 NOTE — Telephone Encounter (Signed)
Rx for Effexor XR 37.5 mg #90 2RF was sent to pharmacy on file on 01/14/2016. Patient states her copay will be $170 for brand and $8 for generic. Requesting rx be changed to generic. Rx for Venlafaxine XR 37.5 mg #90 2RF sent to mail order pharmacy on file. Patient is agreeable.  Routing to provider for final review. Patient agreeable to disposition. Will close encounter.

## 2016-01-20 NOTE — Telephone Encounter (Signed)
Patient says she will need the generic brand of effexor mailed to her mail order because the name brand is too expensive.

## 2016-01-21 ENCOUNTER — Ambulatory Visit (INDEPENDENT_AMBULATORY_CARE_PROVIDER_SITE_OTHER): Payer: PPO | Admitting: Obstetrics and Gynecology

## 2016-01-21 ENCOUNTER — Encounter: Payer: Self-pay | Admitting: Obstetrics and Gynecology

## 2016-01-21 VITALS — BP 140/82 | HR 60 | Ht 62.5 in | Wt 121.0 lb

## 2016-01-21 DIAGNOSIS — G4700 Insomnia, unspecified: Secondary | ICD-10-CM | POA: Diagnosis not present

## 2016-01-21 DIAGNOSIS — N83202 Unspecified ovarian cyst, left side: Secondary | ICD-10-CM

## 2016-01-21 DIAGNOSIS — E278 Other specified disorders of adrenal gland: Secondary | ICD-10-CM

## 2016-01-21 DIAGNOSIS — E279 Disorder of adrenal gland, unspecified: Secondary | ICD-10-CM

## 2016-01-21 DIAGNOSIS — N83201 Unspecified ovarian cyst, right side: Secondary | ICD-10-CM

## 2016-01-21 NOTE — Progress Notes (Signed)
Patient ID: Kylie Tucker, female   DOB: Jul 27, 1950, 65 y.o.   MRN: [id number] GYNECOLOGY  VISIT   HPI: 64 y.o.   Married  Caucasian  female   G3P2 with Patient's last menstrual period was 01/10/2003.   here for follow up.  Patient states sleeping better.  Did not see psychiatry.   Effexor has been refilled for one year.  Patient has been on this chronically.   Feels everything was affected from abx usage.  Now is feeling she cannot telling when she is satisfied after eating. Weight is the same. Asking for leptin, ghrelin, and cholecystokinin testing.  Will see GI in August and ask for that testing.   Concerned about the right ovarian cysts and nodule seen on her adrenal gland on the CT scan which was done in the ER Dept.  Asking to review her ovarian cyst and the plan for this again.  Happy that the CA125 was normal.  CA125 - 6 on 12/26/15.  Pelvic ultrasound 12/19/15:  CLINICAL DATA: The patient presented to the emergency room with nausea and vomiting. A possible right adnexal mass was identified on CT from today.  EXAM: TRANSABDOMINAL AND TRANSVAGINAL ULTRASOUND OF PELVIS  TECHNIQUE: Both transabdominal and transvaginal ultrasound examinations of the pelvis were performed. Transabdominal technique was performed for global imaging of the pelvis including uterus, ovaries, adnexal regions, and pelvic cul-de-sac. It was necessary to proceed with endovaginal exam following the transabdominal exam to visualize the ovaries and endometrium.  COMPARISON: CT scan from today and ultrasound of the pelvis from Nov 20, 2013  FINDINGS: Uterus  Measurements: 4.7 x 2.3 x 3.6 cm. No fibroids or other mass visualized.  Endometrium  Thickness: 2.7 mm. No focal abnormality visualized.  Right ovary  Measurements: 2.8 x 2.3 x 2.2 cm. Multiple small cystic areas are seen in the right ovary. It is unclear whether this is 1 dominant multi septated cyst or several  individual cysts. The largest single cystic component measures 1.8 x 1.0 x 2.0 cm. If taken as 1 a multi septated cystic mass, the mass would measure 2.2 x 1.6 cm. The previous study demonstrated multiple small sub cm cysts in the right ovary.  Left ovary  Measurements: 2.4 x 1.4 x 2.6 cm. A single 9 x 9 x 11 mm cyst is seen in the left ovary.  Other findings  No abnormal free fluid.  IMPRESSION: 1. There are a either multiple adjacent cysts in the right ovary or 1 dominant multi septated cyst measuring 2.2 x 1.6 cm. Based on the previous study, I suspect there are multiple cysts rather than 1 septated cyst but the findings are unclear. Given the possibility of multiple thin septations within a single cystic mass, recommend gynecologic consultation. 2. There is a single 11 mm simple cyst in the left ovary requiring a follow-up ultrasound in 1 year. 3. No other abnormalities.   Electronically Signed  By: Dorise Bullion III M.D  On: 12/19/2015 12:54   Shopping car fell in parking lot at supermarket.  Scraped her left arm and bruised her toe.  GYNECOLOGIC HISTORY: Patient's last menstrual period was 01/10/2003. Contraception:  Postmenopausal Menopausal hormone therapy:  None Last mammogram:  Unilateral Left/Density C/Rt.mastectomy/Neg/Birads1:[facility] Last pap smear:   09-26-15 Neg:Neg HR HPV        OB History    Gravida Para Term Preterm AB TAB SAB Ectopic Multiple Living   3 2        1  Patient Active Problem List   Diagnosis Date Noted  . Chest pain 09/23/2015  . Abnormal vision 06/13/2015  . Anxiety disorder 06/13/2015  . Cataract cortical, senile 06/13/2015  . Clinical depression 06/13/2015  . Cephalalgia 06/13/2015  . H/O malignant neoplasm of breast 06/13/2015  . 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 Sinus infection 06/13/2015  . Long term current use of systemic steroids 06/13/2015  . Chronic systolic CHF (congestive heart failure),  NYHA class 1 ([place]) 05/09/2015  . LBBB (left bundle branch block) 03/26/2015  . Lymphedema of arm 12/13/2013  . Complete rupture of rotator cuff 11/22/2013  . Nipple discharge in female 12/01/2012  . History of breast cancer 11/30/2012  . Epiphora 11/28/2012  . Disorder of endocrine system 04/25/2012  . Ceratitis 04/25/2012  . Blepharitis 04/25/2012  . Breast cancer, right breast ([place])   . FATIGUE 10/01/2009  . 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 Dyspnea 10/01/2009  . HYPERLIPIDEMIA 09/30/2009    Past Medical History  Diagnosis Date  . Cellulitis and abscess of upper arm and forearm   . Postmastectomy lymphedema rt side  . History of right mastectomy 2004    chemo/ radiation  . Migraines   . Cancer ([place]) rt breast    2004/ chemo/ radiation  . Allergy   . Hyperlipidemia   . PONV (postoperative nausea and vomiting)     cannot take zofran  . GERD (gastroesophageal reflux disease)   . Sentinel node   . Anxiety   . Left bundle branch block 2016  . Hypertension   . H/O Helicobacter infection 2017    Past Surgical History  Procedure Laterality Date  . Rt mastectomy  2004    lymph nodes-7-axillary node dissection  . Nasal sinus surgery  1990/2001  . Mandible surgery  1991  . Port-a-cath removal      in and now out  . Nasal sinus surgery    . Mouth surgery    . Orif finger fracture  02/14/2012    Procedure: OPEN REDUCTION INTERNAL FIXATION (ORIF) METACARPAL (FINGER) FRACTURE;  Surgeon: Wynonia Sours, MD;  Location: [facility];  Service: Orthopedics;  Laterality: Right;  RIGHT FIFTH   . Breast lumpectomy  2004     br bx  . Orif metacarpal fracture  8/13    rt   . Repair extensor tendon Left 05/30/2013    Procedure: RELEASE TRANSPOSITION EXTENSOR POLLICUS LONGUS LEFT WRIST;  Surgeon: Wynonia Sours, MD;  Location: [place];  Service: Orthopedics;  Laterality: Left;  . Breast lumpectomy with axillary lymph node dissection    . Carpal tunnel release    .  Wrist arthroscopy Left 2014  . Shoulder arthroscopy with rotator cuff repair and subacromial decompression Left 11/22/2013    Procedure: LEFT SHOULDER ARTHROSCOPY WITH SUBACROMIAL DECOMPRESSION DISTAL CLAVICLE RESECTION REPAIR  ROTATOR CUFF AS NEEDED ;  Surgeon: Cammie Sickle., MD;  Location: [facility];  Service: Orthopedics;  Laterality: Left;  . Cataract extraction Left 07/12/2013    Current Outpatient Prescriptions  Medication Sig Dispense Refill  . ALPRAZOLAM XR 2 MG 24 hr tablet Take 2 mg by mouth 2 (two) times daily.    . calcium carbonate (OS-CAL) 600 MG TABS Take 600 mg by mouth 2 (two) times daily with a meal.      . Cholecalciferol (VITAMIN D) 2000 UNITS CAPS Take 1 capsule by mouth daily.     Marland Kitchen eletriptan (RELPAX) 40 MG tablet Take 40 mg by mouth  as needed for migraine or headache. Reported on 10/20/2015    . Multiple Vitamins-Minerals (MULTIVITAMINS THER. W/MINERALS) TABS Take 1 tablet by mouth daily.      . nebivolol (BYSTOLIC) 5 MG tablet Take 1 tablet (5 mg total) by mouth daily. 90 tablet 3  . Olopatadine HCl (PATADAY) 0.2 % SOLN Place 1 drop into both eyes as needed.     . Probiotic Product (PROBIOTIC DAILY PO) Take by mouth daily.     Marland Kitchen venlafaxine XR (EFFEXOR-XR) 37.5 MG 24 hr capsule Take 1 capsule (37.5 mg total) by mouth daily with breakfast. 90 capsule 2  . [DISCONTINUED] metoCLOPramide (REGLAN) 10 MG tablet Take 1 tablet (10 mg total) by mouth 3 (three) times daily as needed for nausea (headache / nausea). (Patient not taking: Reported on 12/19/2015) 20 tablet 0   No current facility-administered medications for this visit.     ALLERGIES: Acetaminophen; Amoxicillin-pot clavulanate; Azithromycin; Cefuroxime axetil; Cephalexin; Hydrocodone-acetaminophen; Hydrocodone-acetaminophen; Levofloxacin; Morphine and related; Ondansetron; Propoxyphene n-acetaminophen; Rosuvastatin; Sulfonamide derivatives; Tramadol; Trazodone and nefazodone; Vioxx; Zofran;  Clarithromycin; Codeine; and Sulfamethoxazole  Family History  Problem Relation Age of Onset  . Heart disease Father   . Cancer Maternal Aunt     breast  . Heart disease Paternal Uncle   . Heart disease Paternal Grandmother   . Heart disease Paternal Grandfather   . Heart disease Mother   . Hypertension Mother   . Diabetes Brother     Social History   Social History  . Marital Status: Married    Spouse Name: N/A  . Number of Children: N/A  . Years of Education: N/A   Occupational History  . Not on file.   Social History Main Topics  . Smoking status: Never Smoker   . Smokeless tobacco: Never Used  . Alcohol Use: No  . Drug Use: No  . Sexual Activity:    Partners: Male    Birth Control/ Protection: Post-menopausal   Other Topics Concern  . Not on file   Social History Narrative    ROS:  Pertinent items are noted in HPI.  PHYSICAL EXAMINATION:    Ht 5' 2.5" (1.588 m)  LMP 01/10/2003    General appearance: alert, cooperative and appears stated age  ASSESSMENT  Insomnia.  Resolved spontaneously.  Anxiety and depression.  Lack of satiety.  Stable weight. Bilateral ovarian cysts.  Multicystic right ovary and simple left ovarian cyst.  Normal CA125.  Left adrenal nodule noted on CT scan.    PLAN  Discussion of Effexor and potential side effects.  Patient will continue on the medication and has refills for one year.  She will pursue evaluation of lack of satiety with her PCP or GI. Discussion of ovarian cysts and the reassuring evaluation to date. She understands that we have already planned for a recheck of her ovaries by ultrasound in Sept. In office. She will follow up with her PCP regarding the adrenal nodule seen on CT scan.    An After Visit Summary was printed and given to the patient.  _15____ minutes face to face time of which over 50% was spent in counseling.

## 2016-01-22 ENCOUNTER — Encounter: Payer: Self-pay | Admitting: Cardiovascular Disease

## 2016-01-22 ENCOUNTER — Ambulatory Visit (INDEPENDENT_AMBULATORY_CARE_PROVIDER_SITE_OTHER): Payer: PPO | Admitting: Cardiovascular Disease

## 2016-01-22 VITALS — BP 144/84 | HR 64 | Ht 62.5 in | Wt 121.8 lb

## 2016-01-22 DIAGNOSIS — I5022 Chronic systolic (congestive) heart failure: Secondary | ICD-10-CM | POA: Diagnosis not present

## 2016-01-22 DIAGNOSIS — I447 Left bundle-branch block, unspecified: Secondary | ICD-10-CM | POA: Diagnosis not present

## 2016-01-22 MED ORDER — NEBIVOLOL HCL 5 MG PO TABS: 5.0000 mg | ORAL_TABLET | Freq: Every day | ORAL | Status: DC

## 2016-01-22 NOTE — Patient Instructions (Signed)
Medication Instructions:  Your physician recommends that you continue on your current medications as directed. Please refer to the Current Medication list given to you today.   Labwork: None Ordered   Testing/Procedures: Your physician has requested that you have an echocardiogram in 3 months 1 week before your office visit. Echocardiography is a painless test that uses sound waves to create images of your heart. It provides your doctor with information about the size and shape of your heart and how well your heart's chambers and valves are working. This procedure takes approximately one hour. There are no restrictions for this procedure.   Follow-Up: Your physician recommends that you schedule a follow-up appointment in: 3 months with echo 1 week before.   If you need a refill on your cardiac medications before your next appointment, please call your pharmacy.   Thank you for choosing [facility]! Christen Bame, RN [phone]

## 2016-01-22 NOTE — [place] Notes (Signed)
Cardiology Office Note   Date:  01/22/2016   ID:  Kylie Tucker, DOB Mar 22, 1951, MRN [id number]  PCP:  Glo Herring., MD  Cardiologist:   Mertie Moores, MD   Chief Complaint  Patient presents with  . Follow-up   Problem List:  1. Atypical chest pain 2. Breast cancer ( right breast , 2004) s/p surgery and XRT.  3. LBBB  4. Chronic combined systolic and diastolic CHF    History of Present Illness: Kylie Tucker is a 65 y.o. female who presents for evaluation of some atypical chest pain . Has lots of fatigue. Has leg heaviness and fatigue. Has some back pain - sciatic nerve issues  The sharp chest pain would ease if she changed position or takes a deep breath  Does not get any exercise recently - mostly due to back pain   She had a normal cardiac cath in the remote past   Oct. 28, 2016:  Kylie Tucker had an echo that revealed moderate LV dysfunction  Left ventricle: The cavity size was normal. Systolic function was mildly to moderately reduced. The estimated ejection fraction was in the range of 40% to 45%. Moderate diffuse hypokinesis with no identifiable regional variations. Features are consistent with a pseudonormal left ventricular filling pattern, with concomitant abnormal relaxation and increased filling pressure (grade 2 diastolic dysfunction). - Ventricular septum: Septal motion showed paradox. These changes are consistent with a left bundle branch block. - Mitral valve: There was moderate regurgitation directed centrally   Dec. 2, 2016:  Seen for follow up Tolerating thecoreg.   Did not tolerate the Benicar.  Avoiding salt. Still is very fatigued - especially with walking or any exertion.  September 12, 2015:  Doing well.    Is taking Coreg 6. 25 mg only once a day .  Does not tolerate the higher dose.   October 15, 2015:  Kylie Tucker is seen Today for follow-up visit. She was seen about 3 weeks ago with an episode of chest pain and  shortness breath that woke her up in the middle of the night.    It took her quite a long time to get relaxed before she got to sleep   The pain lasted for about 3-5 minutes.    Squeezing sensation .    Not related to eating or drinking .   She was seen by Dr. Radford Pax. Laboratory data at that time was normal including a normal d-dimer, normal troponin, and a normal BNP .  She thinks that it was "sleep paralysis"  (researched it online )  Turned from her right side to her back ,  Felt much better at that time  Was afraid to go back to sleep .   Has not had any other issues since that time. Has been exercising   Some ,   Has a right bakers cyst  - limits her walking  Has had some back injections that have also helped.  Has seen Dr. Percell Miller and he did not want to operate on this .   Is tolerating the coreg.  Brought a list a questions / concerns 1. Dry mouth -   2. Left hand fingers tingling - thinks its related to Coreg.  3.  BP has been elevated.   4. Stomach issues - rolling  Thinks these are related to her coreg.   January 22, 2016:  Followed for chronic systolic CHF - EF [id number] by echo   Had an H. Pylori infection in March .  Lost lots of weight  Has lost 23 lbs since last year.  Threw her system off - has not felt well  Feeling better.  Tried Losartan  Had complications with Coreg.   Now on Bystolic  Did not tolerate the Losartan . No more CP  Breathing is going well Exercising some    Past Medical History  Diagnosis Date  . Cellulitis and abscess of upper arm and forearm   . Postmastectomy lymphedema rt side  . History of right mastectomy 2004    chemo/ radiation  . Migraines   . Cancer ([place]) rt breast    2004/ chemo/ radiation  . Allergy   . Hyperlipidemia   . PONV (postoperative nausea and vomiting)     cannot take zofran  . GERD (gastroesophageal reflux disease)   . Sentinel node   . Anxiety   . Left bundle branch block 2016  . Hypertension   . H/O  Helicobacter infection 2017    Past Surgical History  Procedure Laterality Date  . Rt mastectomy  2004    lymph nodes-7-axillary node dissection  . Nasal sinus surgery  1990/2001  . Mandible surgery  1991  . Port-a-cath removal      in and now out  . Nasal sinus surgery    . Mouth surgery    . Orif finger fracture  02/14/2012    Procedure: OPEN REDUCTION INTERNAL FIXATION (ORIF) METACARPAL (FINGER) FRACTURE;  Surgeon: Wynonia Sours, MD;  Location: [facility];  Service: Orthopedics;  Laterality: Right;  RIGHT FIFTH   . Breast lumpectomy  2004     br bx  . Orif metacarpal fracture  8/13    rt   . Repair extensor tendon Left 05/30/2013    Procedure: RELEASE TRANSPOSITION EXTENSOR POLLICUS LONGUS LEFT WRIST;  Surgeon: Wynonia Sours, MD;  Location: [facility];  Service: Orthopedics;  Laterality: Left;  . Breast lumpectomy with axillary lymph node dissection    . Carpal tunnel release    . Wrist arthroscopy Left 2014  . Shoulder arthroscopy with rotator cuff repair and subacromial decompression Left 11/22/2013    Procedure: LEFT SHOULDER ARTHROSCOPY WITH SUBACROMIAL DECOMPRESSION DISTAL CLAVICLE RESECTION REPAIR  ROTATOR CUFF AS NEEDED ;  Surgeon: Cammie Sickle., MD;  Location: [facility];  Service: Orthopedics;  Laterality: Left;  . Cataract extraction Left 07/12/2013     Current Outpatient Prescriptions  Medication Sig Dispense Refill  . ALPRAZOLAM XR 2 MG 24 hr tablet Take 2 mg by mouth 2 (two) times daily.    . calcium carbonate (OS-CAL) 600 MG TABS Take 600 mg by mouth 2 (two) times daily with a meal.      . Cholecalciferol (VITAMIN D) 2000 UNITS CAPS Take 1 capsule by mouth daily.     Marland Kitchen eletriptan (RELPAX) 40 MG tablet Take 40 mg by mouth as needed for migraine or headache. Reported on 10/20/2015    . Multiple Vitamins-Minerals (MULTIVITAMINS THER. W/MINERALS) TABS Take 1 tablet by mouth daily.      . nebivolol (BYSTOLIC) 5 MG tablet Take  1 tablet (5 mg total) by mouth daily. 90 tablet 3  . Olopatadine HCl (PATADAY) 0.2 % SOLN Place 1 drop into both eyes as needed.     . Probiotic Product (PROBIOTIC DAILY PO) Take 1 tablet by mouth daily.     Marland Kitchen venlafaxine XR (EFFEXOR-XR) 37.5 MG 24 hr capsule Take 1 capsule (37.5 mg total) by mouth daily with breakfast.  90 capsule 2  . [DISCONTINUED] metoCLOPramide (REGLAN) 10 MG tablet Take 1 tablet (10 mg total) by mouth 3 (three) times daily as needed for nausea (headache / nausea). (Patient not taking: Reported on 12/19/2015) 20 tablet 0   No current facility-administered medications for this visit.    Allergies:   Acetaminophen; Amoxicillin-pot clavulanate; Azithromycin; Cefuroxime axetil; Cephalexin; Hydrocodone-acetaminophen; Hydrocodone-acetaminophen; Levofloxacin; Morphine and related; Ondansetron; Propoxyphene n-acetaminophen; Rosuvastatin; Sulfonamide derivatives; Tramadol; Trazodone and nefazodone; Vioxx; Zofran; Clarithromycin; Codeine; and Sulfamethoxazole    Social History:  The patient  reports that she has never smoked. She has never used smokeless tobacco. She reports that she does not drink alcohol or use illicit drugs.   Family History:  The patient's family history includes Cancer in her maternal aunt; Diabetes in her brother; Heart disease in her father, mother, paternal grandfather, paternal grandmother, and paternal uncle; Hypertension in her mother.    ROS:  Please see the history of present illness.    Review of Systems: Constitutional:  denies fever, chills, diaphoresis, appetite change and fatigue.  HEENT: denies photophobia, eye pain, redness, hearing loss, ear pain, congestion, sore throat, rhinorrhea, sneezing, neck pain, neck stiffness and tinnitus.  Respiratory: denies SOB, DOE, cough, chest tightness, and wheezing.  Cardiovascular: denies chest pain, palpitations and leg swelling.  Gastrointestinal: admits to  Some stomach issues  Genitourinary: denies  dysuria, urgency, frequency, hematuria, flank pain and difficulty urinating.  Musculoskeletal: denies  myalgias, back pain, joint swelling, arthralgias and gait problem.   Skin: denies pallor, rash and wound.  Neurological: denies dizziness, seizures, syncope, weakness, light-headedness, numbness and headaches.   Hematological: denies adenopathy, easy bruising, personal or family bleeding history.  Psychiatric/ Behavioral: denies suicidal ideation, mood changes, confusion, nervousness, sleep disturbance and agitation.       All other systems are reviewed and negative.    PHYSICAL EXAM: VS:  BP 144/84 mmHg  Pulse 64  Ht 5' 2.5" (1.588 m)  Wt 121 lb 12.8 oz (55.248 kg)  BMI 21.91 kg/m2  LMP 01/10/2003 , BMI Body mass index is 21.91 kg/(m^2). GEN: Well nourished, well developed, in no acute distress HEENT: normal Neck: no JVD, carotid bruits, or masses Cardiac: RRR; no murmurs,  Reverse splitting of the second heart sound   Respiratory:  clear to auscultation bilaterally, normal work of breathing GI: soft, nontender, nondistended, + BS MS: no deformity or atrophy, right arm is wrapped in an ace wrap  Skin: warm and dry, no rash Neuro:  Strength and sensation are intact Psych: normal   EKG:  EKG is ordered today. The ekg ordered today demonstrates NSR at 75.  LBBB  - the LBBB is new since previous tracing    Recent Labs: 09/23/2015: Brain Natriuretic Peptide 60.4; Brain Natriuretic Peptide CANCELED 12/19/2015: Hemoglobin 15.0; Platelets 314 12/25/2015: ALT 15; BUN 10; Creat 0.87; Potassium 4.2; Sodium 139 12/26/2015: TSH 2.36    Lipid Panel    Component Value Date/Time   CHOL 186 12/25/2015 0855   TRIG 103 12/25/2015 0855   HDL 48 12/25/2015 0855   CHOLHDL 3.9 12/25/2015 0855   VLDL 21 12/25/2015 0855   LDLCALC 117 12/25/2015 0855   LDLDIRECT 145.4 05/04/2010 0827      Wt Readings from Last 3 Encounters:  01/22/16 121 lb 12.8 oz (55.248 kg)  01/21/16 121 lb (54.885  kg)  12/26/15 117 lb 9.6 oz (53.343 kg)      Other studies Reviewed: Additional studies/ records that were reviewed today include: . Review of the above  records demonstrates:    ASSESSMENT AND PLAN:  1.  LBBB - new since 2011.   Has had adriamycin.  Echocardiogram showed very mild left ventricular dysfunction. The Myoview study showed no evidence of ischemia and showed normal left ventricular function.    2. Chest wall pain. She clearly has costochondritis. Ive recommended  that she take Motrin as needed.  3. Fatigue: I suspect this is because she does not sleep well.   Will DC the coreg .   She seems to have lots of side effects from it   4. Chronic combined systolic and diastolic  congestive heart failure: She has mildly depressed left ventricle systolic motion.   She is not tolerating the coreg.   She's now on Bystolic 5 mg a day. We had long discussion about starting the losartan back that she wants to wait. Will get an echocardiogram in approximately 3 months. We'll see her in the office one week following that.  Current medicines are reviewed at length with the patient today.  The patient does not have concerns regarding medicines.  The following changes have been made:  no change  Labs/ tests ordered today include:  No orders of the defined types were placed in this encounter.     Disposition:   FU with me in 3 months      Mertie Moores, MD  01/22/2016 2:54 PM    [facility] [place], [place], [place]  [address] Phone: [phone]; Fax: [phone]   [facility]  [address] [place],   [address] [phone]   Fax [phone]

## 2016-01-26 DIAGNOSIS — Z6821 Body mass index (BMI) 21.0-21.9, adult: Secondary | ICD-10-CM | POA: Diagnosis not present

## 2016-01-26 DIAGNOSIS — F419 Anxiety disorder, unspecified: Secondary | ICD-10-CM | POA: Diagnosis not present

## 2016-01-26 DIAGNOSIS — K219 Gastro-esophageal reflux disease without esophagitis: Secondary | ICD-10-CM | POA: Diagnosis not present

## 2016-01-26 DIAGNOSIS — Z0000 Encounter for general adult medical examination without abnormal findings: Secondary | ICD-10-CM | POA: Diagnosis not present

## 2016-02-04 DIAGNOSIS — E7800 Pure hypercholesterolemia, unspecified: Secondary | ICD-10-CM | POA: Diagnosis not present

## 2016-02-04 DIAGNOSIS — I428 Other cardiomyopathies: Secondary | ICD-10-CM | POA: Diagnosis not present

## 2016-02-04 DIAGNOSIS — I10 Essential (primary) hypertension: Secondary | ICD-10-CM | POA: Diagnosis not present

## 2016-02-04 DIAGNOSIS — I447 Left bundle-branch block, unspecified: Secondary | ICD-10-CM | POA: Diagnosis not present

## 2016-02-05 ENCOUNTER — Telehealth: Payer: Self-pay | Admitting: Cardiovascular Disease

## 2016-02-05 NOTE — Telephone Encounter (Signed)
New message    Patient want the nurse / MD know she will not be coming back to see Dr. Acie Fredrickson   going to see Dr. Einar Gip. Instead.

## 2016-02-05 NOTE — Telephone Encounter (Signed)
Noted  

## 2016-02-06 NOTE — Telephone Encounter (Signed)
Noted  

## 2016-02-16 DIAGNOSIS — R1011 Right upper quadrant pain: Secondary | ICD-10-CM | POA: Diagnosis not present

## 2016-02-16 DIAGNOSIS — R112 Nausea with vomiting, unspecified: Secondary | ICD-10-CM | POA: Diagnosis not present

## 2016-02-16 DIAGNOSIS — R1013 Epigastric pain: Secondary | ICD-10-CM | POA: Diagnosis not present

## 2016-03-05 ENCOUNTER — Telehealth: Payer: Self-pay | Admitting: Obstetrics and Gynecology

## 2016-03-05 NOTE — Telephone Encounter (Signed)
Called patient to review benefits for procedure. Left voicemail to call back and review. °

## 2016-03-25 ENCOUNTER — Other Ambulatory Visit: Payer: PPO | Admitting: Obstetrics and Gynecology

## 2016-03-25 ENCOUNTER — Other Ambulatory Visit: Payer: PPO

## 2016-04-01 ENCOUNTER — Ambulatory Visit (INDEPENDENT_AMBULATORY_CARE_PROVIDER_SITE_OTHER): Payer: PPO | Admitting: Obstetrics and Gynecology

## 2016-04-01 ENCOUNTER — Encounter: Payer: Self-pay | Admitting: Obstetrics and Gynecology

## 2016-04-01 ENCOUNTER — Ambulatory Visit (INDEPENDENT_AMBULATORY_CARE_PROVIDER_SITE_OTHER): Payer: PPO

## 2016-04-01 VITALS — BP 120/70 | HR 60 | Resp 12 | Ht 62.5 in | Wt 120.6 lb

## 2016-04-01 DIAGNOSIS — Z803 Family history of malignant neoplasm of breast: Secondary | ICD-10-CM | POA: Diagnosis not present

## 2016-04-01 DIAGNOSIS — L659 Nonscarring hair loss, unspecified: Secondary | ICD-10-CM

## 2016-04-01 DIAGNOSIS — N83201 Unspecified ovarian cyst, right side: Secondary | ICD-10-CM

## 2016-04-01 DIAGNOSIS — N83202 Unspecified ovarian cyst, left side: Secondary | ICD-10-CM | POA: Diagnosis not present

## 2016-04-01 DIAGNOSIS — Z853 Personal history of malignant neoplasm of breast: Secondary | ICD-10-CM

## 2016-04-01 LAB — CBC
HEMATOCRIT: 42.9 % (ref 35.0–45.0)
HEMOGLOBIN: 14.1 g/dL (ref 11.7–15.5)
MCH: 29.9 pg (ref 27.0–33.0)
MCHC: 32.9 g/dL (ref 32.0–36.0)
MCV: 91.1 fL (ref 80.0–100.0)
MPV: 10.6 fL (ref 7.5–12.5)
Platelets: 294 10*3/uL (ref 140–400)
RBC: 4.71 MIL/uL (ref 3.80–5.10)
RDW: 13.3 % (ref 11.0–15.0)
WBC: 6.8 10*3/uL (ref 3.8–10.8)

## 2016-04-01 LAB — IRON: Iron: 69 ug/dL (ref 45–160)

## 2016-04-01 NOTE — [facility] Notes (Signed)
GYNECOLOGY  VISIT   HPI: 65 y.o.   Married  Caucasian  female   G3P2 with Patient's last menstrual period was 01/10/2003.   here for pelvic ultrasound for bilateral ovarian cysts.    Seen on 01/21/16 for visit to review ovarian cysts seen on CT scan through ER visit.  CA125 was 6 on 12/26/15. Pelvic ultrasound on 12/19/15 showed:  CLINICAL DATA: The patient presented to the emergency room with nausea and vomiting. A possible right adnexal mass was identified on CT from today.  EXAM: TRANSABDOMINAL AND TRANSVAGINAL ULTRASOUND OF PELVIS  TECHNIQUE: Both transabdominal and transvaginal ultrasound examinations of the pelvis were performed. Transabdominal technique was performed for global imaging of the pelvis including uterus, ovaries, adnexal regions, and pelvic cul-de-sac. It was necessary to proceed with endovaginal exam following the transabdominal exam to visualize the ovaries and endometrium.  COMPARISON: CT scan from today and ultrasound of the pelvis from Nov 20, 2013  FINDINGS: Uterus  Measurements: 4.7 x 2.3 x 3.6 cm. No fibroids or other mass visualized.  Endometrium  Thickness: 2.7 mm. No focal abnormality visualized.  Right ovary  Measurements: 2.8 x 2.3 x 2.2 cm. Multiple small cystic areas are seen in the right ovary. It is unclear whether this is 1 dominant multi septated cyst or several individual cysts. The largest single cystic component measures 1.8 x 1.0 x 2.0 cm. If taken as 1 a multi septated cystic mass, the mass would measure 2.2 x 1.6 cm. The previous study demonstrated multiple small sub cm cysts in the right ovary.  Left ovary  Measurements: 2.4 x 1.4 x 2.6 cm. A single 9 x 9 x 11 mm cyst is seen in the left ovary.  Other findings  No abnormal free fluid.  IMPRESSION: 1. There are a either multiple adjacent cysts in the right ovary or 1 dominant multi septated cyst measuring 2.2 x 1.6 cm. Based on the previous  study, I suspect there are multiple cysts rather than 1 septated cyst but the findings are unclear. Given the possibility of multiple thin septations within a single cystic mass, recommend gynecologic consultation. 2. There is a single 11 mm simple cyst in the left ovary requiring a follow-up ultrasound in 1 year. 3. No other abnormalities.   Electronically Signed  By: Dorise Bullion III M.D  On: 12/19/2015 12:54  ROS - trying to put on 10 pounds.  Some hair thinning.  No real hair loss.  Wants to test her iron level.  Had normal TFTs earlier this year.  Asking about genetic testing and ovarian removal. FH maternal aunt and maternal grandmother with breast cancer.  Patient is breast cancer survivor.  Mother's first cousins with breast cancer.   No family hx of ovarian, colon, or uterine cancer as far as patient knows.  GYNECOLOGIC HISTORY: Patient's last menstrual period was 01/10/2003. Contraception:  Postmenopause Menopausal hormone therapy:  none Last mammogram: 04/07/15 BIRADS1 neg, [facility] Last pap smear: 09/26/15 WNL neg HR HPV        OB History    Gravida Para Term Preterm AB Living   _0 SAB TAB Ectopic Multiple Live Births                     Patient Active Problem List   Diagnosis Date Noted  . Chest pain 09/23/2015  . Abnormal vision 06/13/2015  . Anxiety disorder 06/13/2015  . Cataract cortical,  senile 06/13/2015  . Clinical depression 06/13/2015  . Cephalalgia 06/13/2015  . H/O malignant neoplasm of breast 06/13/2015  . 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 Sinus infection 06/13/2015  . Long term current use of systemic steroids 06/13/2015  . Chronic systolic CHF (congestive heart failure), NYHA class 1 ([place]) 05/09/2015  . LBBB (left bundle branch block) 03/26/2015  . Lymphedema of arm 12/13/2013  . Complete rupture of rotator cuff 11/22/2013  . Nipple discharge in female 12/01/2012  . History of breast cancer 11/30/2012  .  Epiphora 11/28/2012  . Disorder of endocrine system 04/25/2012  . Ceratitis 04/25/2012  . Blepharitis 04/25/2012  . Breast cancer, right breast ([place])   . FATIGUE 10/01/2009  . 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 Dyspnea 10/01/2009  . HYPERLIPIDEMIA 09/30/2009    Past Medical History:  Diagnosis Date  . Allergy   . Anxiety   . Cancer ([place]) rt breast   2004/ chemo/ radiation  . Cellulitis and abscess of upper arm and forearm   . GERD (gastroesophageal reflux disease)   . H/O Helicobacter infection 2017  . History of right mastectomy 2004   chemo/ radiation  . Hyperlipidemia   . Hypertension   . Left bundle branch block 2016  . Migraines   . PONV (postoperative nausea and vomiting)    cannot take zofran  . Postmastectomy lymphedema rt side  . Sentinel node     Past Surgical History:  Procedure Laterality Date  . BREAST LUMPECTOMY  2004    br bx  . BREAST LUMPECTOMY WITH AXILLARY LYMPH NODE DISSECTION    . CARPAL TUNNEL RELEASE    . CATARACT EXTRACTION Left 07/12/2013  . MANDIBLE SURGERY  1991  . MOUTH SURGERY    . NASAL SINUS SURGERY  1990/2001  . NASAL SINUS SURGERY    . ORIF FINGER FRACTURE  02/14/2012   Procedure: OPEN REDUCTION INTERNAL FIXATION (ORIF) METACARPAL (FINGER) FRACTURE;  Surgeon: Wynonia Sours, MD;  Location: [facility];  Service: Orthopedics;  Laterality: Right;  RIGHT FIFTH   . ORIF METACARPAL FRACTURE  8/13   rt   . PORT-A-CATH REMOVAL     in and now out  . REPAIR EXTENSOR TENDON Left 05/30/2013   Procedure: RELEASE TRANSPOSITION EXTENSOR POLLICUS LONGUS LEFT WRIST;  Surgeon: Wynonia Sours, MD;  Location: [facility];  Service: Orthopedics;  Laterality: Left;  . rt mastectomy  2004   lymph nodes-7-axillary node dissection  . SHOULDER ARTHROSCOPY WITH ROTATOR CUFF REPAIR AND SUBACROMIAL DECOMPRESSION Left 11/22/2013   Procedure: LEFT SHOULDER ARTHROSCOPY WITH SUBACROMIAL DECOMPRESSION DISTAL CLAVICLE RESECTION REPAIR  ROTATOR CUFF AS  NEEDED ;  Surgeon: Cammie Sickle., MD;  Location: [facility];  Service: Orthopedics;  Laterality: Left;  . WRIST ARTHROSCOPY Left 2014    Current Outpatient Prescriptions  Medication Sig Dispense Refill  . ALPRAZOLAM XR 2 MG 24 hr tablet Take 2 mg by mouth daily.     . calcium carbonate (OS-CAL) 600 MG TABS Take 600 mg by mouth 2 (two) times daily with a meal.      . Cholecalciferol (VITAMIN D) 2000 UNITS CAPS Take 1 capsule by mouth daily.     Marland Kitchen eletriptan (RELPAX) 40 MG tablet Take 40 mg by mouth as needed for migraine or headache. Reported on 10/20/2015    . Multiple Vitamins-Minerals (MULTIVITAMINS THER. W/MINERALS) TABS Take 1 tablet by mouth daily.      . nebivolol (BYSTOLIC) 5 MG tablet Take 1 tablet (5  mg total) by mouth daily. 90 tablet 3  . Olopatadine HCl (PATADAY) 0.2 % SOLN Place 1 drop into both eyes as needed.     . Probiotic Product (PROBIOTIC DAILY PO) Take 1 tablet by mouth daily.     Marland Kitchen venlafaxine XR (EFFEXOR-XR) 37.5 MG 24 hr capsule Take 1 capsule (37.5 mg total) by mouth daily with breakfast. 90 capsule 2   No current facility-administered medications for this visit.      ALLERGIES: Acetaminophen; Amoxicillin-pot clavulanate; Azithromycin; Cefuroxime axetil; Cephalexin; Hydrocodone-acetaminophen; Hydrocodone-acetaminophen; Levofloxacin; Morphine and related; Ondansetron; Propoxyphene n-acetaminophen; Rosuvastatin; Sulfonamide derivatives; Tramadol; Trazodone and nefazodone; Vioxx [rofecoxib]; Zofran [ondansetron hcl]; Clarithromycin; Codeine; and Sulfamethoxazole  Family History  Problem Relation Age of Onset  . Heart disease Father   . Cancer Maternal Aunt     breast  . Heart disease Paternal Uncle   . Heart disease Paternal Grandmother   . Heart disease Paternal Grandfather   . Heart disease Mother   . Hypertension Mother   . Diabetes Brother     Social History   Social History  . Marital status: Married    Spouse name: N/A  . Number  of children: N/A  . Years of education: N/A   Occupational History  . Not on file.   Social History Main Topics  . Smoking status: Never Smoker  . Smokeless tobacco: Never Used  . Alcohol use No  . Drug use: No  . Sexual activity: Yes    Partners: Male    Birth control/ protection: Post-menopausal   Other Topics Concern  . Not on file   Social History Narrative  . No narrative on file    ROS:  Pertinent items are noted in HPI.  PHYSICAL EXAMINATION:    BP 120/70 (BP Location: Left Arm, Patient Position: Sitting, Cuff Size: Normal)   Pulse 60   Resp 12   Ht 5' 2.5" (1.588 m)   Wt 120 lb 9.6 oz (54.7 kg)   LMP 01/10/2003   BMI 21.71 kg/m     General appearance: alert, cooperative and appears stated age  Pelvic ultrasound today: Uterus no masses.  EMS 2 mm. Bilateral ovaries with several follicles 10 mm or less. No free fluid.   ASSESSMENT  Family and personal hx of breast cancer.  Stable bilateral ovarian cysts.  Hair thinning.  PLAN  Discussion of ovarian cysts and follicles.  Discussion of BRCA and genetic mutations that increase risk of breast, ovarian, and other cancers. Yearly pelvic ultrasound.  Referral for genetics counseling and potential testing. CBC, iron, ferritin.   An After Visit Summary was printed and given to the patient.  ___15___ minutes face to face time of which over 50% was spent in counseling.

## 2016-04-02 ENCOUNTER — Encounter: Payer: Self-pay | Admitting: Genetic Counselor

## 2016-04-02 ENCOUNTER — Telehealth: Payer: Self-pay | Admitting: Genetic Counselor

## 2016-04-02 LAB — FERRITIN: Ferritin: 86 ng/mL (ref 20–288)

## 2016-04-02 NOTE — Telephone Encounter (Signed)
Pt confirm appt, complete intake, mailed pt letter, faxed referring provider appt date/time

## 2016-04-05 ENCOUNTER — Ambulatory Visit
Admission: RE | Admit: 2016-04-05 | Discharge: 2016-04-05 | Disposition: A | Discharge status: Home / Self Care | Payer: PPO | Source: Ambulatory Visit | Attending: Oncology | Admitting: Oncology

## 2016-04-05 ENCOUNTER — Ambulatory Visit: Payer: Commercial Managed Care - HMO

## 2016-04-05 DIAGNOSIS — Z1231 Encounter for screening mammogram for malignant neoplasm of breast: Secondary | ICD-10-CM | POA: Diagnosis not present

## 2016-04-05 DIAGNOSIS — Z9011 Acquired absence of right breast and nipple: Secondary | ICD-10-CM

## 2016-04-05 DIAGNOSIS — Z853 Personal history of malignant neoplasm of breast: Secondary | ICD-10-CM

## 2016-04-08 DIAGNOSIS — L723 Sebaceous cyst: Secondary | ICD-10-CM | POA: Diagnosis not present

## 2016-04-08 DIAGNOSIS — L650 Telogen effluvium: Secondary | ICD-10-CM | POA: Diagnosis not present

## 2016-04-14 ENCOUNTER — Telehealth: Payer: Self-pay | Admitting: *Deleted

## 2016-04-14 NOTE — Telephone Encounter (Signed)
"  I have an appointment October 30 th with Genetics.  I've called my insurance.  Need to know if this genetic testing will be covered. I need to speak with Santiago Glad or someone who handles insurance."  Call [phone] [phone].

## 2016-04-26 ENCOUNTER — Ambulatory Visit
Admission: RE | Admit: 2016-04-26 | Discharge: 2016-04-26 | Disposition: A | Discharge status: Home / Self Care | Payer: PPO | Source: Ambulatory Visit | Attending: Oncology | Admitting: Oncology

## 2016-04-26 ENCOUNTER — Inpatient Hospital Stay: Admission: RE | Admit: 2016-04-26 | Payer: PPO | Source: Ambulatory Visit

## 2016-04-26 DIAGNOSIS — N6321 Unspecified lump in the left breast, upper outer quadrant: Secondary | ICD-10-CM | POA: Diagnosis not present

## 2016-04-26 DIAGNOSIS — N6320 Unspecified lump in the left breast, unspecified quadrant: Secondary | ICD-10-CM | POA: Diagnosis not present

## 2016-04-26 DIAGNOSIS — Z853 Personal history of malignant neoplasm of breast: Secondary | ICD-10-CM

## 2016-04-26 DIAGNOSIS — R922 Inconclusive mammogram: Secondary | ICD-10-CM

## 2016-04-26 DIAGNOSIS — C50911 Malignant neoplasm of unspecified site of right female breast: Secondary | ICD-10-CM

## 2016-04-26 MED ORDER — GADOBENATE DIMEGLUMINE 529 MG/ML IV SOLN
11.0000 mL | Freq: Once | INTRAVENOUS | Status: AC | PRN
  Administered 2016-04-26: 11 mL via INTRAVENOUS

## 2016-04-27 ENCOUNTER — Other Ambulatory Visit: Payer: Self-pay | Admitting: Oncology

## 2016-04-27 DIAGNOSIS — N6320 Unspecified lump in the left breast, unspecified quadrant: Secondary | ICD-10-CM

## 2016-04-27 NOTE — [facility] Notes (Signed)
Called patient and discussed the MRI results. She is agreeable to MRI guided biopsy. Order placed

## 2016-04-28 ENCOUNTER — Other Ambulatory Visit: Payer: Self-pay | Admitting: Oncology

## 2016-04-28 DIAGNOSIS — N63 Unspecified lump in unspecified breast: Secondary | ICD-10-CM

## 2016-05-06 ENCOUNTER — Ambulatory Visit
Admission: RE | Admit: 2016-05-06 | Discharge: 2016-05-06 | Disposition: A | Discharge status: Home / Self Care | Payer: PPO | Source: Ambulatory Visit | Attending: Oncology | Admitting: Oncology

## 2016-05-06 ENCOUNTER — Other Ambulatory Visit: Payer: Self-pay | Admitting: Oncology

## 2016-05-06 ENCOUNTER — Other Ambulatory Visit: Payer: PPO

## 2016-05-06 ENCOUNTER — Encounter: Payer: Self-pay | Admitting: Oncology

## 2016-05-06 DIAGNOSIS — N63 Unspecified lump in unspecified breast: Secondary | ICD-10-CM

## 2016-05-06 DIAGNOSIS — Z853 Personal history of malignant neoplasm of breast: Secondary | ICD-10-CM | POA: Diagnosis not present

## 2016-05-06 MED ORDER — GADOBENATE DIMEGLUMINE 529 MG/ML IV SOLN
11.0000 mL | Freq: Once | INTRAVENOUS | Status: AC | PRN
  Administered 2016-05-06: 11 mL via INTRAVENOUS

## 2016-05-07 ENCOUNTER — Encounter: Payer: Self-pay | Admitting: Genetic Counselor

## 2016-05-07 DIAGNOSIS — F419 Anxiety disorder, unspecified: Secondary | ICD-10-CM | POA: Diagnosis not present

## 2016-05-07 DIAGNOSIS — Z6822 Body mass index (BMI) 22.0-22.9, adult: Secondary | ICD-10-CM | POA: Diagnosis not present

## 2016-05-07 DIAGNOSIS — F410 Panic disorder [episodic paroxysmal anxiety] without agoraphobia: Secondary | ICD-10-CM | POA: Diagnosis not present

## 2016-05-07 DIAGNOSIS — Z23 Encounter for immunization: Secondary | ICD-10-CM | POA: Diagnosis not present

## 2016-05-07 DIAGNOSIS — Z79899 Other long term (current) drug therapy: Secondary | ICD-10-CM | POA: Diagnosis not present

## 2016-05-07 DIAGNOSIS — E669 Obesity, unspecified: Secondary | ICD-10-CM | POA: Diagnosis not present

## 2016-05-07 DIAGNOSIS — Z1389 Encounter for screening for other disorder: Secondary | ICD-10-CM | POA: Diagnosis not present

## 2016-05-10 ENCOUNTER — Ambulatory Visit (HOSPITAL_BASED_OUTPATIENT_CLINIC_OR_DEPARTMENT_OTHER): Payer: PPO | Admitting: Genetic Counselor

## 2016-05-10 ENCOUNTER — Other Ambulatory Visit: Payer: PPO

## 2016-05-10 ENCOUNTER — Encounter: Payer: Self-pay | Admitting: Genetic Counselor

## 2016-05-10 DIAGNOSIS — C50911 Malignant neoplasm of unspecified site of right female breast: Secondary | ICD-10-CM | POA: Diagnosis not present

## 2016-05-10 DIAGNOSIS — Z315 Encounter for genetic counseling: Secondary | ICD-10-CM | POA: Diagnosis not present

## 2016-05-10 DIAGNOSIS — Z170 Estrogen receptor positive status [ER+]: Principal | ICD-10-CM

## 2016-05-10 DIAGNOSIS — Z800 Family history of malignant neoplasm of digestive organs: Secondary | ICD-10-CM | POA: Diagnosis not present

## 2016-05-10 DIAGNOSIS — Z803 Family history of malignant neoplasm of breast: Secondary | ICD-10-CM | POA: Insufficient documentation

## 2016-05-10 DIAGNOSIS — Z853 Personal history of malignant neoplasm of breast: Secondary | ICD-10-CM | POA: Diagnosis not present

## 2016-05-10 NOTE — Progress Notes (Signed)
REFERRING PROVIDER: Redmond School, MD [place], [place] [address]  Lurline Del, MD  [name], MD PRIMARY PROVIDER:  Glo Herring., MD  PRIMARY REASON FOR VISIT:  1. Malignant neoplasm of right breast in female, estrogen receptor positive, unspecified site of breast ([place])   2. Family history of breast cancer   3. Family history of rectal cancer      HISTORY OF PRESENT ILLNESS:   Kylie Tucker, a 65 y.o. female, was seen for a [facility] cancer genetics consultation at the request of Dr. Jana Hakim due to a personal and family history of cancer.  Kylie Tucker presents to clinic today to discuss the possibility of a hereditary predisposition to cancer, genetic testing, and to further clarify her future cancer risks, as well as potential cancer risks for family members.   In 2004, at the age of 40, Kylie Tucker was diagnosed with cancer of the right breast. This was treated with chemotherapy, radiation and unilateral mastectomy.  She was placed on tamoxifen as well.       CANCER HISTORY:   No history exists.     HORMONAL RISK FACTORS:  Menarche was at age 49.  First live birth at age 60.  OCP use for approximately <5 years.  Ovaries intact: yes.  Hysterectomy: no.  Menopausal status: postmenopausal.  HRT use: 0 years. Colonoscopy: yes; total of 5 polyps and is on a 5 year schedule. Mammogram within the last year: yes. Number of breast biopsies: 1. Up to date with pelvic exams:  yes. Any excessive radiation exposure in the past:  no  Past Medical History:  Diagnosis Date  . Allergy   . Anxiety   . Bilateral ovarian cysts    Do yearly ultrasound.  . Cancer ([place]) rt breast   2004/ chemo/ radiation  . Cellulitis and abscess of upper arm and forearm   . Family history of breast cancer   . Family history of rectal cancer   . GERD (gastroesophageal reflux disease)   . H/O Helicobacter infection 2017  . History of right mastectomy 2004   chemo/  radiation  . Hyperlipidemia   . Hypertension   . Left bundle branch block 2016  . Migraines   . PONV (postoperative nausea and vomiting)    cannot take zofran  . Postmastectomy lymphedema rt side  . Sentinel node     Past Surgical History:  Procedure Laterality Date  . BREAST LUMPECTOMY  2004    br bx  . BREAST LUMPECTOMY WITH AXILLARY LYMPH NODE DISSECTION    . CARPAL TUNNEL RELEASE    . CATARACT EXTRACTION Left 07/12/2013  . MANDIBLE SURGERY  1991  . MOUTH SURGERY    . NASAL SINUS SURGERY  1990/2001  . NASAL SINUS SURGERY    . ORIF FINGER FRACTURE  02/14/2012   Procedure: OPEN REDUCTION INTERNAL FIXATION (ORIF) METACARPAL (FINGER) FRACTURE;  Surgeon: Wynonia Sours, MD;  Location: [facility];  Service: Orthopedics;  Laterality: Right;  RIGHT FIFTH   . ORIF METACARPAL FRACTURE  8/13   rt   . PORT-A-CATH REMOVAL     in and now out  . REPAIR EXTENSOR TENDON Left 05/30/2013   Procedure: RELEASE TRANSPOSITION EXTENSOR POLLICUS LONGUS LEFT WRIST;  Surgeon: Wynonia Sours, MD;  Location: [facility];  Service: Orthopedics;  Laterality: Left;  . rt mastectomy  2004   lymph nodes-7-axillary node dissection  . SHOULDER ARTHROSCOPY WITH ROTATOR CUFF REPAIR AND SUBACROMIAL DECOMPRESSION Left 11/22/2013  Procedure: LEFT SHOULDER ARTHROSCOPY WITH SUBACROMIAL DECOMPRESSION DISTAL CLAVICLE RESECTION REPAIR  ROTATOR CUFF AS NEEDED ;  Surgeon: Cammie Sickle., MD;  Location: [facility];  Service: Orthopedics;  Laterality: Left;  . WRIST ARTHROSCOPY Left 2014    Social History   Social History  . Marital status: Married    Spouse name: N/A  . Number of children: N/A  . Years of education: N/A   Social History Main Topics  . Smoking status: Never Smoker  . Smokeless tobacco: Never Used  . Alcohol use No  . Drug use: No  . Sexual activity: Yes    Partners: Male    Birth control/ protection: Post-menopausal   Other Topics Concern  . None    Social History Narrative  . None     FAMILY HISTORY:  We obtained a detailed, 4-generation family history.  Significant diagnoses are listed below: Family History  Problem Relation Age of Onset  . Heart disease Father   . Heart disease Mother   . Hypertension Mother   . Rectal cancer Mother     dx in her early 98s  . Colon polyps Mother   . Breast cancer Maternal Aunt     dx in her early 65s  . Heart disease Paternal Uncle   . Heart disease Paternal Grandmother   . Heart disease Paternal Grandfather   . Diabetes Brother   . Breast cancer Maternal Grandmother     dx in her 87s  . Breast cancer Cousin     dx in early 12s  . Breast cancer Cousin 65    The patient has two sons, one is deceased.  She has a brother who has never had cancer.  The patient's mother had colon polyps and was diagnosed with rectal cancer in her 42's.  The patient's mother had three sisters and four brothers.  One sister had breast cancer in her early 22's, and this sister had a daughter with breast cancer in her 46's.  The patient's maternal grandmother had breast cancer in her 50's as well.  The patient's father died of a heart attack.  He had 6 brothers who were cancer free, but one had a daughter with breast cancer at 45.  There is no other reported family history of cancer.  Patient's maternal ancestors are of [place] descent, and paternal ancestors are of [place] descent. There is no reported Ashkenazi Jewish ancestry. There is no known consanguinity.  GENETIC COUNSELING ASSESSMENT: Kylie Tucker is a 65 y.o. female with a personal and family history of breast cancer which is somewhat suggestive of a hereditary cancer syndrome and predisposition to cancer. We, therefore, discussed and recommended the following at today's visit.   DISCUSSION: We discussed that about 5-10% of breast cancer is hereditary with most cases due to BRCA mutations.  We discussed that other genes can sometimes be implicated in  hereditary breast cancer, including PALB2, ATM and CHEK2. Based on the age of onset of her mother's rectal cancer, this most likely is sporadic, however, we can offer testing of the colorectal cancer genes, in addition to the breast cancer genes, to determine additional hereditary risk. We reviewed the characteristics, features and inheritance patterns of hereditary cancer syndromes. We also discussed genetic testing, including the appropriate family members to test, the process of testing, insurance coverage and turn-around-time for results. We discussed the implications of a negative, positive and/or variant of uncertain significant result. We recommended Ms. Shankles pursue genetic testing for  the Comprehensive Cancer gene panel and MSH2 inversion analysis.  The Comprehensive Cancer Panel offered by [facility] includes sequencing and/or deletion duplication testing of the following 32 genes: APC, ATM, AXIN2, BARD1, BMPR1A, BRCA1, BRCA2, BRIP1, CDH1, CDK4, CDKN2A, CHEK2, EPCAM, FANCC, MLH1, MSH2, MSH6, MUTYH, NBN, PALB2, PMS2, POLD1, POLE, PTEN, RAD51C, RAD51D, SCG5/GREM1, SMAD4, STK11, TP53, VHL, and XRCC2.      Based on Ms. Gresham's personal and family history of cancer, she meets medical criteria for genetic testing. Despite that she meets criteria, she may still have an out of pocket cost. We discussed that if her out of pocket cost for testing is over $100, the laboratory will call and confirm whether she wants to proceed with testing.  If the out of pocket cost of testing is less than $100 she will be billed by the genetic testing laboratory.   PLAN: After considering the risks, benefits, and limitations, Ms. Mastro  provided informed consent to pursue genetic testing and the blood sample was sent to [facility] for analysis of the Comprehensive Cancer panel. Results should be available within approximately 2-3 weeks' time, at which point they will be disclosed by telephone to Ms. Alcalde, as will any  additional recommendations warranted by these results. Ms. Pellegrini will receive a summary of her genetic counseling visit and a copy of her results once available. This information will also be available in Epic. We encouraged Ms. Cisse to remain in contact with cancer genetics annually so that we can continuously update the family history and inform her of any changes in cancer genetics and testing that may be of benefit for her family. Ms. Mitschke questions were answered to her satisfaction today. Our contact information was provided should additional questions or concerns arise.  Lastly, we encouraged Ms. Gumbs to remain in contact with cancer genetics annually so that we can continuously update the family history and inform her of any changes in cancer genetics and testing that may be of benefit for this family.   Ms.  Cosby questions were answered to her satisfaction today. Our contact information was provided should additional questions or concerns arise. Thank you for the referral and allowing [place] to share in the care of your patient.   Karen P. Florene Glen, [place], [facility] Certified Genetic Counselor [email or web address] phone: [phone]  The patient was seen for a total of 45 minutes in face-to-face genetic counseling.  This patient was discussed with Drs. Magrinat, Lindi Adie and/or Burr Medico who agrees with the above.    _______________________________________________________________________ For Office Staff:  Number of people involved in session: 1 Was an Intern/ student involved with case: no

## 2016-05-12 DIAGNOSIS — I447 Left bundle-branch block, unspecified: Secondary | ICD-10-CM | POA: Diagnosis not present

## 2016-05-12 DIAGNOSIS — I428 Other cardiomyopathies: Secondary | ICD-10-CM | POA: Diagnosis not present

## 2016-05-12 DIAGNOSIS — I10 Essential (primary) hypertension: Secondary | ICD-10-CM | POA: Diagnosis not present

## 2016-05-12 DIAGNOSIS — I427 Cardiomyopathy due to drug and external agent: Secondary | ICD-10-CM | POA: Diagnosis not present

## 2016-06-01 ENCOUNTER — Telehealth: Payer: Self-pay | Admitting: Genetic Counselor

## 2016-06-01 ENCOUNTER — Encounter: Payer: Self-pay | Admitting: Genetic Counselor

## 2016-06-01 DIAGNOSIS — Z1379 Encounter for other screening for genetic and chromosomal anomalies: Secondary | ICD-10-CM | POA: Insufficient documentation

## 2016-06-01 NOTE — Telephone Encounter (Signed)
LM on VM with good news.  Asked that she CB for results.  Indicated that I will be out of the office for the Holiday and therefore will be back on Monday.

## 2016-06-07 DIAGNOSIS — Z23 Encounter for immunization: Secondary | ICD-10-CM | POA: Diagnosis not present

## 2016-06-07 DIAGNOSIS — Z1389 Encounter for screening for other disorder: Secondary | ICD-10-CM | POA: Diagnosis not present

## 2016-06-07 DIAGNOSIS — Z6823 Body mass index (BMI) 23.0-23.9, adult: Secondary | ICD-10-CM | POA: Diagnosis not present

## 2016-06-07 DIAGNOSIS — F419 Anxiety disorder, unspecified: Secondary | ICD-10-CM | POA: Diagnosis not present

## 2016-06-08 ENCOUNTER — Ambulatory Visit: Payer: Self-pay | Admitting: Genetic Counselor

## 2016-06-08 ENCOUNTER — Telehealth: Payer: Self-pay | Admitting: Genetic Counselor

## 2016-06-08 DIAGNOSIS — Z853 Personal history of malignant neoplasm of breast: Secondary | ICD-10-CM

## 2016-06-08 DIAGNOSIS — C50911 Malignant neoplasm of unspecified site of right female breast: Secondary | ICD-10-CM

## 2016-06-08 DIAGNOSIS — Z170 Estrogen receptor positive status [ER+]: Secondary | ICD-10-CM

## 2016-06-08 DIAGNOSIS — Z800 Family history of malignant neoplasm of digestive organs: Secondary | ICD-10-CM

## 2016-06-08 DIAGNOSIS — Z1379 Encounter for other screening for genetic and chromosomal anomalies: Secondary | ICD-10-CM

## 2016-06-08 DIAGNOSIS — Z803 Family history of malignant neoplasm of breast: Secondary | ICD-10-CM

## 2016-06-08 NOTE — Telephone Encounter (Signed)
Revealed negative genetic testing.  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  Explained that most VUS get reclassified in the future and typically are inconsequential.  We will follow up with her once this is reclassified, but until then will treat this as a fully negative test.  Patient voiced her understanding and asked that Drs. Magrinat, Lucia Gaskins and Quincy Simmonds be told of this result.

## 2016-06-08 NOTE — Progress Notes (Signed)
HPI: Ms. Bowler was previously seen in the [facility] clinic due to a personal and family history of cancer and concerns regarding a hereditary predisposition to cancer. Please refer to our prior cancer genetics clinic note for more information regarding Ms. Racey's medical, social and family histories, and our assessment and recommendations, at the time. Ms. Hessler recent genetic test results were disclosed to her, as were recommendations warranted by these results. These results and recommendations are discussed in more detail below.  FAMILY HISTORY:  We obtained a detailed, 4-generation family history.  Significant diagnoses are listed below: Family History  Problem Relation Age of Onset  . Heart disease Father   . Heart disease Mother   . Hypertension Mother   . Rectal cancer Mother     dx in her early 65s  . Colon polyps Mother   . Breast cancer Maternal Aunt     dx in her early 51s  . Heart disease Paternal Uncle   . Heart disease Paternal Grandmother   . Heart disease Paternal Grandfather   . Diabetes Brother   . Breast cancer Maternal Grandmother     dx in her 2s  . Breast cancer Cousin     dx in early 35s  . Breast cancer Cousin 74    The patient has two sons, one is deceased.  She has a brother who has never had cancer.  The patient's mother had colon polyps and was diagnosed with rectal cancer in her 30's.  The patient's mother had three sisters and four brothers.  One sister had breast cancer in her early 43's, and this sister had a daughter with breast cancer in her 91's.  The patient's maternal grandmother had breast cancer in her 62's as well.  The patient's father died of a heart attack.  He had 6 brothers who were cancer free, but one had a daughter with breast cancer at 34.  There is no other reported family history of cancer.  Patient's maternal ancestors are of [place] descent, and paternal ancestors are of [place] descent. There is no reported  Ashkenazi Jewish ancestry. There is no known consanguinity.  GENETIC TEST RESULTS: Genetic testing reported out on May 30, 2016 through the Comprehensive cancer panel found no deleterious mutations.  The Comprehensive Cancer Panel offered by [facility] includes sequencing and/or deletion duplication testing of the following 32 genes: APC, ATM, AXIN2, BARD1, BMPR1A, BRCA1, BRCA2, BRIP1, CDH1, CDK4, CDKN2A, CHEK2, EPCAM, FANCC, MLH1, MSH2, MSH6, MUTYH, NBN, PALB2, PMS2, POLD1, POLE, PTEN, RAD51C, RAD51D, SCG5/GREM1, SMAD4, STK11, TP53, VHL, and XRCC2.   The test report has been scanned into EPIC and is located under the Molecular Pathology section of the Results Review tab.   We discussed with Ms. Tesch that since the current genetic testing is not perfect, it is possible there may be a gene mutation in one of these genes that current testing cannot detect, but that chance is small. We also discussed, that it is possible that another gene that has not yet been discovered, or that we have not yet tested, is responsible for the cancer diagnoses in the family, and it is, therefore, important to remain in touch with cancer genetics in the future so that we can continue to offer Ms. Sawatzky the most up to date genetic testing.   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  At this time, it is unknown if this variant is associated with increased  cancer risk or if this is a normal finding, but most variants such as this get reclassified to being inconsequential. It should not be used to make medical management decisions. With time, we suspect the lab will determine the significance of this variant, if any. If we do learn more about it, we will try to contact Ms. Withem to discuss it further. However, it is important to stay in touch with [place] periodically and keep the address and phone number up to date.  CANCER SCREENING RECOMMENDATIONS: This result  is reassuring and indicates that Ms. Jackson likely does not have an increased risk for a future cancer due to a mutation in one of these genes. This normal test also suggests that Ms. Kraemer's cancer was most likely not due to an inherited predisposition associated with one of these genes.  Most cancers happen by chance and this negative test suggests that her cancer falls into this category.  We, therefore, recommended she continue to follow the cancer management and screening guidelines provided by her oncology and primary healthcare provider.   RECOMMENDATIONS FOR FAMILY MEMBERS: Women in this family might be at some increased risk of developing cancer, over the general population risk, simply due to the family history of cancer. We recommended women in this family have a yearly mammogram beginning at age 58, or 79 years younger than the earliest onset of cancer, an annual clinical breast exam, and perform monthly breast self-exams. Women in this family should also have a gynecological exam as recommended by their primary provider. All family members should have a colonoscopy by age 74.  FOLLOW-UP: Lastly, we discussed with Ms. Creech that cancer genetics is a rapidly advancing field and it is possible that new genetic tests will be appropriate for her and/or her family members in the future. We encouraged her to remain in contact with cancer genetics on an annual basis so we can update her personal and family histories and let her know of advances in cancer genetics that may benefit this family.   Our contact number was provided. Ms. Dunleavy questions were answered to her satisfaction, and she knows she is welcome to call us at anytime with additional questions or concerns.   Roma Kayser, MS, [facility] Certified Genetic Counselor [email or web address]

## 2016-06-08 NOTE — Telephone Encounter (Signed)
LM on VM with good news. Asked that she CB for results. 

## 2016-06-10 DIAGNOSIS — H5319 Other subjective visual disturbances: Secondary | ICD-10-CM | POA: Diagnosis not present

## 2016-06-10 DIAGNOSIS — H52223 Regular astigmatism, bilateral: Secondary | ICD-10-CM | POA: Diagnosis not present

## 2016-06-10 DIAGNOSIS — H524 Presbyopia: Secondary | ICD-10-CM | POA: Diagnosis not present

## 2016-06-10 DIAGNOSIS — H5213 Myopia, bilateral: Secondary | ICD-10-CM | POA: Diagnosis not present

## 2016-06-10 DIAGNOSIS — H04123 Dry eye syndrome of bilateral lacrimal glands: Secondary | ICD-10-CM | POA: Diagnosis not present

## 2016-06-23 ENCOUNTER — Telehealth: Payer: Self-pay | Admitting: Obstetrics and Gynecology

## 2016-06-23 ENCOUNTER — Ambulatory Visit: Payer: Self-pay | Admitting: Obstetrics and Gynecology

## 2016-06-23 NOTE — Telephone Encounter (Signed)
Avoid caffeine and any alcohol.  Try Melatonin as a natural option to help with sleep.  I will see her on the Dec. 15.

## 2016-06-23 NOTE — Telephone Encounter (Signed)
Patient thinks the Effexor that she is taking is keeping her awake at night. Patient also is asking for a referral to [facility],

## 2016-06-23 NOTE — Telephone Encounter (Signed)
Spoke with patient. Patient states she goes to bed at night and can't got to sleep, is awake all night long. Patient states she got back from the beach yesterday and has not slept in 3 days. Patient states she is currently taking effexor, bystolic and alprazolam xr. Patient states she has had a decrease in hot flashes, but feels cold all the time. Denies any mood changes or gyn complaints. Patient also request referral to [facility], patient reports it has been 3 years since last visit and will requirea referral. Recommended OV for further evaluation with Dr. Quincy Simmonds, last [place] 04/01/16 for PUS. Patient states she has a dentist appt for today at 1pm will need later appt, scheduled for 12/13 at 3:30pm. Patient verbalizes understanding and is agreeable to date and time.   Routing to provider for final review. Patient is agreeable to disposition. Will close encounter.

## 2016-06-23 NOTE — Telephone Encounter (Signed)
Return call to patient. Left detailed message, ok per current dpr. Advised as seen below per Dr. Quincy Simmonds. Advised to return call to office for any additional questions.  Routing to provider for final review.Will close encounter.

## 2016-06-23 NOTE — Progress Notes (Deleted)
GYNECOLOGY  VISIT   HPI: 65 y.o.   Married  Caucasian  female   G3P2 with Patient's last menstrual period was 01/10/2003.   here for     GYNECOLOGIC HISTORY: Patient's last menstrual period was 01/10/2003. Contraception:  Postmenopausal Menopausal hormone therapy:  none Last mammogram:  04-05-16 Hx Rt.mastectomy;Lt.mmg.Density C/Neg/BiRads1:TBC--04-26-16 Bil.MRI Breasts/mass Lt.Br and rec.biopsy--05-06-16 MRI with contrast and poss.biopsy. No masslike enhancement seen on exam. No evidence of malignancy, as such, no biopsy was performed. Recommend follow up MRI in 6 months to ensure stability/BiRads1/[facility] Last pap smear:   09-10-15 Neg:Neg HR HPV        OB History    Gravida Para Term Preterm AB Living   3 2       1    SAB TAB Ectopic Multiple Live Births                     Patient Active Problem List   Diagnosis Date Noted  . Genetic testing 06/01/2016  . Family history of breast cancer   . Family history of rectal cancer   . Chest pain 09/23/2015  . Abnormal vision 06/13/2015  . Anxiety disorder 06/13/2015  . Cataract cortical, senile 06/13/2015  . Clinical depression 06/13/2015  . Cephalalgia 06/13/2015  . H/O malignant neoplasm of breast 06/13/2015  . 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 Sinus infection 06/13/2015  . Long term current use of systemic steroids 06/13/2015  . Chronic systolic CHF (congestive heart failure), NYHA class 1 ([place]) 05/09/2015  . LBBB (left bundle branch block) 03/26/2015  . Lymphedema of arm 12/13/2013  . Complete rupture of rotator cuff 11/22/2013  . Nipple discharge in female 12/01/2012  . History of breast cancer 11/30/2012  . Epiphora 11/28/2012  . Disorder of endocrine system 04/25/2012  . Ceratitis 04/25/2012  . Blepharitis 04/25/2012  . Breast cancer, right breast ([place])   . FATIGUE 10/01/2009  . 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 Dyspnea 10/01/2009  . HYPERLIPIDEMIA 09/30/2009    Past Medical History:  Diagnosis Date  . Allergy    . Anxiety   . Bilateral ovarian cysts    Do yearly ultrasound.  . Cancer ([place]) rt breast   2004/ chemo/ radiation  . Cellulitis and abscess of upper arm and forearm   . Family history of breast cancer   . Family history of rectal cancer   . GERD (gastroesophageal reflux disease)   . H/O Helicobacter infection 2017  . History of right mastectomy 2004   chemo/ radiation  . Hyperlipidemia   . Hypertension   . Left bundle branch block 2016  . Migraines   . PONV (postoperative nausea and vomiting)    cannot take zofran  . Postmastectomy lymphedema rt side  . Sentinel node     Past Surgical History:  Procedure Laterality Date  . BREAST LUMPECTOMY  2004    br bx  . BREAST LUMPECTOMY WITH AXILLARY LYMPH NODE DISSECTION    . CARPAL TUNNEL RELEASE    . CATARACT EXTRACTION Left 07/12/2013  . MANDIBLE SURGERY  1991  . MOUTH SURGERY    . NASAL SINUS SURGERY  1990/2001  . NASAL SINUS SURGERY    . ORIF FINGER FRACTURE  02/14/2012   Procedure: OPEN REDUCTION INTERNAL FIXATION (ORIF) METACARPAL (FINGER) FRACTURE;  Surgeon: Wynonia Sours, MD;  Location: [facility];  Service: Orthopedics;  Laterality: Right;  RIGHT FIFTH   . ORIF METACARPAL FRACTURE  8/13   rt   . PORT-A-CATH  REMOVAL     in and now out  . REPAIR EXTENSOR TENDON Left 05/30/2013   Procedure: RELEASE TRANSPOSITION EXTENSOR POLLICUS LONGUS LEFT WRIST;  Surgeon: Wynonia Sours, MD;  Location: [facility];  Service: Orthopedics;  Laterality: Left;  . rt mastectomy  2004   lymph nodes-7-axillary node dissection  . SHOULDER ARTHROSCOPY WITH ROTATOR CUFF REPAIR AND SUBACROMIAL DECOMPRESSION Left 11/22/2013   Procedure: LEFT SHOULDER ARTHROSCOPY WITH SUBACROMIAL DECOMPRESSION DISTAL CLAVICLE RESECTION REPAIR  ROTATOR CUFF AS NEEDED ;  Surgeon: Cammie Sickle., MD;  Location: [facility];  Service: Orthopedics;  Laterality: Left;  . WRIST ARTHROSCOPY Left 2014    Current Outpatient  Prescriptions  Medication Sig Dispense Refill  . ALPRAZOLAM XR 2 MG 24 hr tablet Take 2 mg by mouth daily.     . calcium carbonate (OS-CAL) 600 MG TABS Take 600 mg by mouth 2 (two) times daily with a meal.      . Cholecalciferol (VITAMIN D) 2000 UNITS CAPS Take 1 capsule by mouth daily.     Marland Kitchen eletriptan (RELPAX) 40 MG tablet Take 40 mg by mouth as needed for migraine or headache. Reported on 10/20/2015    . Multiple Vitamins-Minerals (MULTIVITAMINS THER. W/MINERALS) TABS Take 1 tablet by mouth daily.      . nebivolol (BYSTOLIC) 5 MG tablet Take 1 tablet (5 mg total) by mouth daily. 90 tablet 3  . Olopatadine HCl (PATADAY) 0.2 % SOLN Place 1 drop into both eyes as needed.     . Probiotic Product (PROBIOTIC DAILY PO) Take 1 tablet by mouth daily.     Marland Kitchen venlafaxine XR (EFFEXOR-XR) 37.5 MG 24 hr capsule Take 1 capsule (37.5 mg total) by mouth daily with breakfast. 90 capsule 2   No current facility-administered medications for this visit.      ALLERGIES: Acetaminophen; Amoxicillin-pot clavulanate; Azithromycin; Cefuroxime axetil; Cephalexin; Hydrocodone-acetaminophen; Hydrocodone-acetaminophen; Levofloxacin; Morphine and related; Ondansetron; Propoxyphene n-acetaminophen; Rosuvastatin; Sulfonamide derivatives; Tramadol; Trazodone and nefazodone; Vioxx [rofecoxib]; Zofran [ondansetron hcl]; Clarithromycin; Codeine; and Sulfamethoxazole  Family History  Problem Relation Age of Onset  . Heart disease Father   . Heart disease Mother   . Hypertension Mother   . Rectal cancer Mother     dx in her early 50s  . Colon polyps Mother   . Breast cancer Maternal Aunt     dx in her early 67s  . Heart disease Paternal Uncle   . Heart disease Paternal Grandmother   . Heart disease Paternal Grandfather   . Diabetes Brother   . Breast cancer Maternal Grandmother     dx in her 58s  . Breast cancer Cousin     dx in early 15s  . Breast cancer Cousin 62    Social History   Social History  . Marital  status: Married    Spouse name: N/A  . Number of children: N/A  . Years of education: N/A   Occupational History  . Not on file.   Social History Main Topics  . Smoking status: Never Smoker  . Smokeless tobacco: Never Used  . Alcohol use No  . Drug use: No  . Sexual activity: Yes    Partners: Male    Birth control/ protection: Post-menopausal   Other Topics Concern  . Not on file   Social History Narrative  . No narrative on file    ROS:  Pertinent items are noted in HPI.  PHYSICAL EXAMINATION:    LMP 01/10/2003  General appearance: alert, cooperative and appears stated age Head: Normocephalic, without obvious abnormality, atraumatic Neck: no adenopathy, supple, symmetrical, trachea midline and thyroid normal to inspection and palpation Lungs: clear to auscultation bilaterally Breasts: normal appearance, no masses or tenderness, No nipple retraction or dimpling, No nipple discharge or bleeding, No axillary or supraclavicular adenopathy Heart: regular rate and rhythm Abdomen: soft, non-tender, no masses,  no organomegaly Extremities: extremities normal, atraumatic, no cyanosis or edema Skin: Skin color, texture, turgor normal. No rashes or lesions Lymph nodes: Cervical, supraclavicular, and axillary nodes normal. No abnormal inguinal nodes palpated Neurologic: Grossly normal  Pelvic: External genitalia:  no lesions              Urethra:  normal appearing urethra with no masses, tenderness or lesions              Bartholins and Skenes: normal                 Vagina: normal appearing vagina with normal color and discharge, no lesions              Cervix: no lesions                Bimanual Exam:  Uterus:  normal size, contour, position, consistency, mobility, non-tender              Adnexa: no mass, fullness, tenderness              Rectal exam: {yes no:314532}.  Confirms.              Anus:  normal sphincter tone, no lesions  Chaperone was present for  exam.  ASSESSMENT     PLAN     An After Visit Summary was printed and given to the patient.  ______ minutes face to face time of which over 50% was spent in counseling.

## 2016-06-23 NOTE — Telephone Encounter (Addendum)
Spoke with patient. Patient states she is just now leaving dentist appt and is unable to make 3:30pm appt. Patient rescheduled for 06/25/16 at 1pm with Dr. Quincy Simmonds. Patient states she is not sure what to do in the meantime until appt time, could the Bystolic be keeping me awake or effexor? Advised patient would review with Dr. Quincy Simmonds and return call with any recommendations. Patient is agreeable.  Dr. Quincy Simmonds, please advise?

## 2016-06-24 NOTE — Progress Notes (Signed)
GYNECOLOGY  VISIT   HPI: 65 y.o.   Married  Caucasian  female   G3P2 with Patient's last menstrual period was 01/10/2003.   here for consult to discuss insomnia.  She thinks that the Bystolic is causing insomnia.  Called Dr. Smith Robert office and was told to take this in the am instead of at night. Not getting up to void at night.  Did not take Bystolic this am.  Feels like her mind is traveling in a thousand different directions.  "Christmas is hard." Had a son who passed away.   Denies depression.  States she just needs to sleep.   Taking Effexor XR every other day for the last 2 weeks Also taking Xanax XR.  Drinks decaffeinated beverages.  Some urinary frequency.  Drinking lime water.  Feels cold. TFTs unremarkable in June 2017.  GYNECOLOGIC HISTORY: Patient's last menstrual period was 01/10/2003. Contraception:  Post menopausal Menopausal hormone therapy:  none Last mammogram:  9/17 left breast density c birads.  1:neg, 10/17 bilateral breast mri & left breast neg, no biopsy needed f/u 85mths. See epic Last pap smear:   09-26-15 neg HPV HR neg        OB History    Gravida Para Term Preterm AB Living   3 2     1 2    SAB TAB Ectopic Multiple Live Births   0 1               Patient Active Problem List   Diagnosis Date Noted  . Genetic testing 06/01/2016  . Family history of breast cancer   . Family history of rectal cancer   . Chest pain 09/23/2015  . Abnormal vision 06/13/2015  . Anxiety disorder 06/13/2015  . Cataract cortical, senile 06/13/2015  . Clinical depression 06/13/2015  . Cephalalgia 06/13/2015  . H/O malignant neoplasm of breast 06/13/2015  . 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 Sinus infection 06/13/2015  . Long term current use of systemic steroids 06/13/2015  . Chronic systolic CHF (congestive heart failure), NYHA class 1 ([place]) 05/09/2015  . LBBB (left bundle branch block) 03/26/2015  . Lymphedema of arm 12/13/2013  . Complete rupture of rotator  cuff 11/22/2013  . Nipple discharge in female 12/01/2012  . History of breast cancer 11/30/2012  . Epiphora 11/28/2012  . Disorder of endocrine system 04/25/2012  . Ceratitis 04/25/2012  . Blepharitis 04/25/2012  . Breast cancer, right breast ([place])   . FATIGUE 10/01/2009  . 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 Dyspnea 10/01/2009  . HYPERLIPIDEMIA 09/30/2009    Past Medical History:  Diagnosis Date  . Allergy   . Anxiety   . Bilateral ovarian cysts    Do yearly ultrasound.  . Cancer ([place]) rt breast   2004/ chemo/ radiation  . Cellulitis and abscess of upper arm and forearm   . Family history of breast cancer   . Family history of rectal cancer   . GERD (gastroesophageal reflux disease)   . H/O Helicobacter infection 2017  . History of right mastectomy 2004   chemo/ radiation  . Hyperlipidemia   . Hypertension   . Left bundle branch block 2016  . Migraines   . PONV (postoperative nausea and vomiting)    cannot take zofran  . Postmastectomy lymphedema rt side  . Sentinel node     Past Surgical History:  Procedure Laterality Date  . BREAST LUMPECTOMY  2004    br bx  . BREAST LUMPECTOMY WITH AXILLARY LYMPH NODE DISSECTION    .  CARPAL TUNNEL RELEASE    . CATARACT EXTRACTION Left 07/12/2013  . MANDIBLE SURGERY  1991  . MOUTH SURGERY    . NASAL SINUS SURGERY  1990/2001  . NASAL SINUS SURGERY    . ORIF FINGER FRACTURE  02/14/2012   Procedure: OPEN REDUCTION INTERNAL FIXATION (ORIF) METACARPAL (FINGER) FRACTURE;  Surgeon: Wynonia Sours, MD;  Location: [facility];  Service: Orthopedics;  Laterality: Right;  RIGHT FIFTH   . ORIF METACARPAL FRACTURE  8/13   rt   . PORT-A-CATH REMOVAL     in and now out  . REPAIR EXTENSOR TENDON Left 05/30/2013   Procedure: RELEASE TRANSPOSITION EXTENSOR POLLICUS LONGUS LEFT WRIST;  Surgeon: Wynonia Sours, MD;  Location: [facility];  Service: Orthopedics;  Laterality: Left;  . rt mastectomy  2004   lymph nodes-7-axillary  node dissection  . SHOULDER ARTHROSCOPY WITH ROTATOR CUFF REPAIR AND SUBACROMIAL DECOMPRESSION Left 11/22/2013   Procedure: LEFT SHOULDER ARTHROSCOPY WITH SUBACROMIAL DECOMPRESSION DISTAL CLAVICLE RESECTION REPAIR  ROTATOR CUFF AS NEEDED ;  Surgeon: Cammie Sickle., MD;  Location: [facility];  Service: Orthopedics;  Laterality: Left;  . WRIST ARTHROSCOPY Left 2014    Current Outpatient Prescriptions  Medication Sig Dispense Refill  . ALPRAZOLAM XR 2 MG 24 hr tablet Take 2 mg by mouth daily.     . calcium carbonate (OS-CAL) 600 MG TABS Take 600 mg by mouth 2 (two) times daily with a meal.      . Cholecalciferol (VITAMIN D) 2000 UNITS CAPS Take 1 capsule by mouth daily.     Marland Kitchen eletriptan (RELPAX) 40 MG tablet Take 40 mg by mouth as needed for migraine or headache. Reported on 10/20/2015    . Multiple Vitamins-Minerals (MULTIVITAMINS THER. W/MINERALS) TABS Take 1 tablet by mouth daily.      . nebivolol (BYSTOLIC) 5 MG tablet Take 1 tablet (5 mg total) by mouth daily. 90 tablet 3  . Probiotic Product (PROBIOTIC DAILY PO) Take 1 tablet by mouth daily.     Marland Kitchen venlafaxine XR (EFFEXOR-XR) 37.5 MG 24 hr capsule Take 1 capsule (37.5 mg total) by mouth daily with breakfast. 90 capsule 2   No current facility-administered medications for this visit.      ALLERGIES: Acetaminophen; Amoxicillin-pot clavulanate; Azithromycin; Cefuroxime axetil; Cephalexin; Hydrocodone-acetaminophen; Hydrocodone-acetaminophen; Levofloxacin; Morphine and related; Ondansetron; Propoxyphene n-acetaminophen; Rosuvastatin; Sulfonamide derivatives; Tramadol; Trazodone and nefazodone; Vioxx [rofecoxib]; Zofran [ondansetron hcl]; Clarithromycin; Codeine; and Sulfamethoxazole  Family History  Problem Relation Age of Onset  . Heart disease Father   . Heart disease Mother   . Hypertension Mother   . Rectal cancer Mother     dx in her early 59s  . Colon polyps Mother   . Breast cancer Maternal Aunt     dx in her  early 33s  . Heart disease Paternal Uncle   . Heart disease Paternal Grandmother   . Heart disease Paternal Grandfather   . Diabetes Brother   . Breast cancer Maternal Grandmother     dx in her 41s  . Breast cancer Cousin     dx in early 20s  . Breast cancer Cousin 85  . Hepatitis C Son   . Hypertension Son   . Post-traumatic stress disorder Son     Social History   Social History  . Marital status: Married    Spouse name: N/A  . Number of children: N/A  . Years of education: N/A   Occupational History  . Not  on file.   Social History Main Topics  . Smoking status: Never Smoker  . Smokeless tobacco: Never Used  . Alcohol use No  . Drug use: No  . Sexual activity: Yes    Partners: Male    Birth control/ protection: Post-menopausal   Other Topics Concern  . Not on file   Social History Narrative  . No narrative on file    ROS:  Pertinent items are noted in HPI.  PHYSICAL EXAMINATION:    BP 118/78   Pulse 70   Resp 16   Ht 5' 2.5" (1.588 m)   Wt 132 lb (59.9 kg)   LMP 01/10/2003   BMI 23.76 kg/m     General appearance: alert, cooperative and appears stated age   ASSESSMENT  Insomnia.  Urinary frequency.  Hx breast cancer.  On Effexor initially for management of menopausal symptoms. HTN.  PLAN  Discussion of insomnia.  Referral to neurology for chronic insomnia. Referral to physical therapy for urinary frequency.  Discussed bladder irritants. Continue Bystolic.    An After Visit Summary was printed and given to the patient.  ____25__ minutes face to face time of which over 50% was spent in counseling.

## 2016-06-25 ENCOUNTER — Ambulatory Visit (INDEPENDENT_AMBULATORY_CARE_PROVIDER_SITE_OTHER): Payer: PPO | Admitting: Obstetrics and Gynecology

## 2016-06-25 ENCOUNTER — Encounter: Payer: Self-pay | Admitting: Obstetrics and Gynecology

## 2016-06-25 VITALS — BP 118/78 | HR 70 | Resp 16 | Ht 62.5 in | Wt 132.0 lb

## 2016-06-25 DIAGNOSIS — R32 Unspecified urinary incontinence: Secondary | ICD-10-CM

## 2016-06-25 DIAGNOSIS — R350 Frequency of micturition: Secondary | ICD-10-CM | POA: Diagnosis not present

## 2016-06-25 DIAGNOSIS — G4700 Insomnia, unspecified: Secondary | ICD-10-CM | POA: Diagnosis not present

## 2016-06-26 ENCOUNTER — Encounter: Payer: Self-pay | Admitting: Obstetrics and Gynecology

## 2016-06-29 ENCOUNTER — Telehealth: Payer: Self-pay | Admitting: Obstetrics and Gynecology

## 2016-06-29 NOTE — Telephone Encounter (Signed)
Patient calling to check on the status of neurology referral to [facility] Neurology.

## 2016-07-21 DIAGNOSIS — I428 Other cardiomyopathies: Secondary | ICD-10-CM | POA: Diagnosis not present

## 2016-07-21 DIAGNOSIS — L570 Actinic keratosis: Secondary | ICD-10-CM | POA: Diagnosis not present

## 2016-07-21 DIAGNOSIS — I10 Essential (primary) hypertension: Secondary | ICD-10-CM | POA: Diagnosis not present

## 2016-07-21 DIAGNOSIS — E7800 Pure hypercholesterolemia, unspecified: Secondary | ICD-10-CM | POA: Diagnosis not present

## 2016-07-22 ENCOUNTER — Ambulatory Visit (INDEPENDENT_AMBULATORY_CARE_PROVIDER_SITE_OTHER): Payer: PPO | Admitting: Neurology

## 2016-07-22 ENCOUNTER — Encounter: Payer: Self-pay | Admitting: Neurology

## 2016-07-22 VITALS — BP 172/86 | HR 80 | Resp 20 | Ht 62.0 in | Wt 132.0 lb

## 2016-07-22 DIAGNOSIS — G4726 Circadian rhythm sleep disorder, shift work type: Secondary | ICD-10-CM

## 2016-07-22 DIAGNOSIS — G478 Other sleep disorders: Secondary | ICD-10-CM

## 2016-07-22 DIAGNOSIS — H9313 Tinnitus, bilateral: Secondary | ICD-10-CM | POA: Diagnosis not present

## 2016-07-22 DIAGNOSIS — F418 Other specified anxiety disorders: Secondary | ICD-10-CM

## 2016-07-22 DIAGNOSIS — F5105 Insomnia due to other mental disorder: Secondary | ICD-10-CM | POA: Diagnosis not present

## 2016-07-22 MED ORDER — ESZOPICLONE 1 MG PO TABS: 1.0000 mg | ORAL_TABLET | Freq: Every evening | ORAL | 1 refills | Status: DC | PRN

## 2016-07-22 NOTE — Patient Instructions (Signed)

## 2016-07-22 NOTE — Progress Notes (Signed)
SLEEP MEDICINE CLINIC   Provider:  Larey Seat, M D  Referring Provider: Redmond School, MD Primary Care Physician:    Chief Complaint  Patient presents with  . New Patient (Initial Visit)    can't sleep, had sleep study about 8 years ago that did not show apnea, thinks bystolic is causing insomnia    HPI:  Kylie Tucker is a 66 y.o. female , seen here as a referral from Dr. Quincy Simmonds , her gynecologist. " Chronic insomnia"   Chief complaint according to patient : "  From March 2017 on through June , during a severe bacterial infection with H. Pylori, I was placed on ATB and got sicker and insomnia got worse and worse. " She has had insomnia for many years but has not always been to the same degree and it waxes and wanes. She remembers that in 2004 after losing her son and being diagnosed with breast cancer ( on tamoxifen) sleep was also very hard to come by. Sometimes she has wondered if medications may have induced or worsened her insomnia, including treatment this bystolic for blood pressure. She does report sometimes having racing thoughts, insomnia and is getting so exhausted that she craves sleep but cannot go back.   Sleep habits are as follows: She is usually retreating to the bedroom around midnight, the bedroom is cool and quiet and dark. She prefers to sleep on her side, and sleeps with 2 pillows. She often feels she doesn't sleep at all- and other days it takes hours to sleep.  Her husband has reported that she sometimes talks in her sleep but he has not heard her snore. She does wake up sometimes with a dry mouth. She underwent a sleep study 8-10 years ago which revealed no apnea was present. [facility]. Her husband describes her as restless, kicking and moving.  She only sleeps 2-3 hours at a time, and she has to urinate at 4 AM, if she drank water late.  She rises at 9.30-10 AM, often exhausted., Not refreshed and not restored. She does not  have morning headaches, her heart is no racing, she denies feeling angina or feeling clammy.   Sleep medical history and family sleep history:  I had 3 d sinus surgeries, 2 brain MRIs with Dr. Denna Haggard.  " last good night sleep was during a trip to [place]" several years ago.   Social history: married, lost one son in 2004, [employer]. Second and first shift worker,  (4 PM to midnight. Second shift ) Never smoked, no ETOH in years, caffeine :  One a week, no soda and no iced tea.  Son died at age 68 due to an overdose.   Mrs Devantier has failed several sleep aids, ambien caused apraxia, ataxia,  Trazodone did not give her sleep , effexor XR help hot flushes, not sleep-, Rozerem caused confusion, melatonin did not work at all.    Review of Systems: Out of a complete 14 system review, the patient complains of only the following symptoms, and all other reviewed systems are negative.  Epworth score  6 , Fatigue severity score 32  , depression score 5/15    Social History   Social History  . Marital status: Married    Spouse name: N/A  . Number of children: N/A  . Years of education: N/A   Occupational History  . Not on file.   Social History Main Topics  .  Smoking status: Never Smoker  . Smokeless tobacco: Never Used  . Alcohol use No  . Drug use: No  . Sexual activity: Yes    Partners: Male    Birth control/ protection: Post-menopausal   Other Topics Concern  . Not on file   Social History Narrative  . No narrative on file    Family History  Problem Relation Age of Onset  . Heart disease Father   . Heart disease Mother   . Hypertension Mother   . Rectal cancer Mother     dx in her early 61s  . Colon polyps Mother   . Breast cancer Maternal Aunt     dx in her early 20s  . Heart disease Paternal Uncle   . Heart disease Paternal Grandmother   . Heart disease Paternal Grandfather   . Diabetes Brother   . Breast cancer Maternal Grandmother     dx in her  33s  . Breast cancer Cousin     dx in early 53s  . Breast cancer Cousin 40  . Hepatitis C Son   . Hypertension Son   . Post-traumatic stress disorder Son     Past Medical History:  Diagnosis Date  . Allergy   . Anxiety   . Bilateral ovarian cysts    Do yearly ultrasound.  . Cancer ([place]) rt breast   2004/ chemo/ radiation  . Cellulitis and abscess of upper arm and forearm   . Family history of breast cancer   . Family history of rectal cancer   . GERD (gastroesophageal reflux disease)   . H/O Helicobacter infection 2017  . History of right mastectomy 2004   chemo/ radiation  . Hyperlipidemia   . Hypertension   . Left bundle branch block 2016  . Migraines   . PONV (postoperative nausea and vomiting)    cannot take zofran  . Postmastectomy lymphedema rt side  . Sentinel node     Past Surgical History:  Procedure Laterality Date  . BREAST LUMPECTOMY  2004    br bx  . BREAST LUMPECTOMY WITH AXILLARY LYMPH NODE DISSECTION    . CARPAL TUNNEL RELEASE    . CATARACT EXTRACTION Left 07/12/2013  . MANDIBLE SURGERY  1991  . MOUTH SURGERY    . NASAL SINUS SURGERY  1990/2001  . NASAL SINUS SURGERY    . ORIF FINGER FRACTURE  02/14/2012   Procedure: OPEN REDUCTION INTERNAL FIXATION (ORIF) METACARPAL (FINGER) FRACTURE;  Surgeon: Wynonia Sours, MD;  Location: [facility];  Service: Orthopedics;  Laterality: Right;  RIGHT FIFTH   . ORIF METACARPAL FRACTURE  8/13   rt   . PORT-A-CATH REMOVAL     in and now out  . REPAIR EXTENSOR TENDON Left 05/30/2013   Procedure: RELEASE TRANSPOSITION EXTENSOR POLLICUS LONGUS LEFT WRIST;  Surgeon: Wynonia Sours, MD;  Location: [facility];  Service: Orthopedics;  Laterality: Left;  . rt mastectomy  2004   lymph nodes-7-axillary node dissection  . SHOULDER ARTHROSCOPY WITH ROTATOR CUFF REPAIR AND SUBACROMIAL DECOMPRESSION Left 11/22/2013   Procedure: LEFT SHOULDER ARTHROSCOPY WITH SUBACROMIAL DECOMPRESSION DISTAL CLAVICLE  RESECTION REPAIR  ROTATOR CUFF AS NEEDED ;  Surgeon: Cammie Sickle., MD;  Location: [facility];  Service: Orthopedics;  Laterality: Left;  . WRIST ARTHROSCOPY Left 2014    Current Outpatient Prescriptions  Medication Sig Dispense Refill  . ALPRAZOLAM XR 2 MG 24 hr tablet Take 2 mg by mouth daily.     Marland Kitchen  calcium carbonate (OS-CAL) 600 MG TABS Take 600 mg by mouth 2 (two) times daily with a meal.      . Cholecalciferol (VITAMIN D) 2000 UNITS CAPS Take 1 capsule by mouth daily.     Marland Kitchen eletriptan (RELPAX) 40 MG tablet Take 40 mg by mouth as needed for migraine or headache. Reported on 10/20/2015    . Multiple Vitamins-Minerals (MULTIVITAMINS THER. W/MINERALS) TABS Take 1 tablet by mouth daily.      . Probiotic Product (PROBIOTIC DAILY PO) Take 1 tablet by mouth daily.     Marland Kitchen venlafaxine XR (EFFEXOR-XR) 37.5 MG 24 hr capsule Take 1 capsule (37.5 mg total) by mouth daily with breakfast. 90 capsule 2  . nebivolol (BYSTOLIC) 5 MG tablet Take 1 tablet (5 mg total) by mouth daily. (Patient not taking: Reported on 07/22/2016) 90 tablet 3   No current facility-administered medications for this visit.     Allergies as of 07/22/2016 - Review Complete 07/22/2016  Allergen Reaction Noted  . Acetaminophen Nausea Only 06/28/2011  . Amoxicillin-pot clavulanate Nausea Only   . Azithromycin  07/07/2013  . Cefuroxime axetil Nausea Only   . Cephalexin Nausea Only   . Hydrocodone-acetaminophen Hives   . Hydrocodone-acetaminophen  06/13/2015  . Levofloxacin Nausea Only   . Morphine and related Other (See Comments) 06/28/2011  . Ondansetron Nausea And Vomiting 06/28/2011  . Propoxyphene n-acetaminophen Nausea And Vomiting   . Rosuvastatin  10/09/2009  . Sulfonamide derivatives Itching   . Tramadol Nausea And Vomiting 02/11/2012  . Trazodone and nefazodone Nausea Only 12/10/2015  . Vioxx [rofecoxib] Nausea And Vomiting 06/28/2011  . Zofran [ondansetron hcl] Nausea And Vomiting 02/11/2012  .  Clarithromycin Nausea Only and Anxiety   . Codeine Itching and Rash   . Sulfamethoxazole Rash 06/13/2015    Vitals: BP (!) 172/86   Pulse 80   Resp 20   Ht 5\' 2"  (1.575 m)   Wt 132 lb (59.9 kg)   LMP 01/10/2003   BMI 24.14 kg/m  Last Weight:  Wt Readings from Last 1 Encounters:  07/22/16 132 lb (59.9 kg)   [id number] mass index is 24.14 kg/m.     Last Height:   Ht Readings from Last 1 Encounters:  07/22/16 5\' 2"  (1.575 m)    Physical exam:  General: The patient is awake, alert and appears not in acute distress. The patient is well groomed. Head: Normocephalic, atraumatic. Neck is supple. Mallampati 4,  neck circumference:13.5. Nasal airflow  Allergic rhinitis. , TMJ is  Not  evident . Prognathia is seen.  Cardiovascular:  Regular rate and rhythm, 80 bpm , without  murmurs or carotid bruit, and without distended neck veins. Respiratory: Lungs are clear to auscultation. Skin:  Without evidence of edema, or rash Trunk: BMI is 24  The patient's posture is erect   Neurologic exam : The patient is awake and alert, oriented to place and time.      Attention span & concentration ability appears normal.  Speech is fluent,  without dysarthria,  But dysphonia . Mood and affect are appropriate.  Cranial nerves: Pupils are equal and briskly reactive to light. Funduscopic exam without evidence of pallor or edema. Extraocular movements  in vertical and horizontal planes intact and without nystagmus. Visual fields by finger perimetry are intact.Hearing to finger rub intact.  Facial sensation intact to fine touch. Facial motor strength is symmetric and tongue and uvula move midline. Shoulder shrug was symmetrical.   Motor exam:  Normal tone, muscle bulk and  symmetric strength in all extremities.  Sensory:  Fine touch, pinprick and vibration , proprioception tested in the upper extremities was normal.  Coordination: Rapid alternating movements ,Finger-to-nose maneuver  normal without  evidence of ataxia, dysmetria or tremor.  Gait and station: Patient walks without assistive device and is able unassisted to climb up to the exam table. Strength within normal limits.  Stance is stable and normal.  .Tandem gait is unfragmented. Turns with  3  Steps. Romberg testing is  negative.  Deep tendon reflexes: in the  upper and lower extremities are symmetric and intact. Babinski maneuver response is  downgoing.  The patient was advised of the nature of the diagnosed sleep disorder , the treatment options and risks for general a health and wellness arising from not treating the condition.  I spent more than 45  minutes of face to face time with the patient. Greater than 50% of time was spent in counseling and coordination of care. We have discussed the diagnosis and differential and I answered the patient's questions.     Assessment:  After physical and neurologic examination, review of laboratory studies,  Personal review of imaging studies, reports of other /same  Imaging studies ,  Results of polysomnography/ neurophysiology testing and pre-existing records as far as provided in visit., my assessment is   1) I have some indication for REM sleep talking, for mouth breathing but there was no report of snoring.  I suspect the insomnia is chronic and psychologically induced.   2) I would consider an antidepressant and/ or sleep aid for insomnia care and referral to a psychologist. She has tried trazodone, and it could not work. Seroquel is still a consideration. Rozerem was causing confusion. Melatonin no success. Effexor XR was prescribed for hot flushes, but did not help sleep.   3) I will rule out PLM, sleep apnea in an attended sleep study.  Please remember to try to maintain good sleep hygiene, which means: Keep a regular sleep and wake schedule, try not to exercise or have a meal within 2 hours of your bedtime, try to keep your bedroom conducive for sleep, that is, cool and dark,  without light distractors such as an illuminated alarm clock, and refrain from watching TV right before sleep or in the middle of the night and do not keep the TV or radio on during the night. Also, try not to use or play on electronic devices at bedtime, such as your cell phone, tablet PC or laptop. If you like to read at bedtime on an electronic device, try to dim the background light as much as possible. Do not eat in the middle of the night.   We will request a sleep study.    We will look for leg twitching and snoring or sleep apnea.   For chronic insomnia, you are best followed by a psychiatrist and/or sleep psychologist.   We will call you with the sleep study results and make a follow up appointment if needed.      Plan:  Treatment plan and additional workup : PSG with parasomnia montage.      Asencion Partridge Tamkia Temples MD  07/22/2016   CC: Redmond School, [address] [place] [place], [place] [address]

## 2016-07-26 ENCOUNTER — Telehealth: Payer: Self-pay

## 2016-07-26 NOTE — Telephone Encounter (Signed)
PA for lunesta completed and sent to [facility]. Should have a determination in 3-5 business days.

## 2016-08-02 NOTE — Telephone Encounter (Signed)
Kylie Tucker has approved eszopiclone 1 mg tablet until 07/11/2017.

## 2016-08-04 ENCOUNTER — Institutional Professional Consult (permissible substitution): Payer: PPO | Admitting: Neurology

## 2016-08-09 ENCOUNTER — Telehealth: Payer: Self-pay | Admitting: Neurology

## 2016-08-09 ENCOUNTER — Other Ambulatory Visit: Payer: Self-pay | Admitting: Neurology

## 2016-08-09 ENCOUNTER — Encounter: Payer: Self-pay | Admitting: Neurology

## 2016-08-09 DIAGNOSIS — F5104 Psychophysiologic insomnia: Secondary | ICD-10-CM

## 2016-08-09 MED ORDER — QUETIAPINE FUMARATE 25 MG PO TABS: 25.0000 mg | ORAL_TABLET | Freq: Every day | ORAL | 1 refills | Status: DC

## 2016-08-09 MED ORDER — ZALEPLON 10 MG PO CAPS: 10.0000 mg | ORAL_CAPSULE | Freq: Every evening | ORAL | 0 refills | Status: DC | PRN

## 2016-08-09 NOTE — Telephone Encounter (Signed)
Has tried lunesta for 2 weeks, She remained insomnic  for much of the night finally going to sleep at about 4 AM and not having enough sleep to function well in daytime. Medication was Johnnye Sima , she had previously tried Ambien which failed actually left her confused and disoriented, she had tried trazodone which also did not work for her. She has been prescribed alprazolam for many years by her oncologist. She continues to take this but it has no longer as sleepy making affect.  Her medical doctor increased it to 3 mg it is still not effective to provide sleep. I will have to change from [place] receptor medication away to something like several quell. We will have to preauthorize this medication I will order 25 mg Seroquel at night for the patient but will ask her to start the medication on a Friday so that she can investigate and observe how she reacts to it. She is not diabetic. She was advised about tremor and weight gain risk.  She will come in for her sleep study on 08-13-2016, and accepts that we may need to refer her to psychology/ psychiatry.

## 2016-08-09 NOTE — Telephone Encounter (Signed)
Lunesta failed, we can try Sonata ( generic ) . I will write an order.

## 2016-08-09 NOTE — Telephone Encounter (Signed)
Patient called stated Kylie Tucker took before bed last night was not able to sleep.  Patient took another one at 2:00am with no help and still was not able to sleep.  Patient is wanting to know if there is another medication she can take to help her sleep.

## 2016-08-09 NOTE — Telephone Encounter (Signed)
I called pt and advised her that Dr. Brett Fairy has changed her medication to sonata. Pt wants to speak with Dr. Brett Fairy to discuss this. Call was transferred to Dr. Brett Fairy.

## 2016-08-09 NOTE — Addendum Note (Signed)
Addended by: Larey Seat on: 08/09/2016 04:27 PM   Modules accepted: Orders

## 2016-08-10 MED ORDER — QUETIAPINE FUMARATE 25 MG PO TABS: 25.0000 mg | ORAL_TABLET | Freq: Every day | ORAL | 1 refills | Status: DC

## 2016-08-10 NOTE — Telephone Encounter (Signed)
Seroquel RX sent to [facility] in [place].

## 2016-08-10 NOTE — Telephone Encounter (Signed)
Patient requesting QUEtiapine (SEROQUEL) 25 MG tablet sent to [facility] in [place]. Yesterday was sent to mail order by mistake.

## 2016-08-10 NOTE — Addendum Note (Signed)
Addended by: Lester [place] A on: 08/10/2016 09:49 AM   Modules accepted: Orders

## 2016-08-11 DIAGNOSIS — K219 Gastro-esophageal reflux disease without esophagitis: Secondary | ICD-10-CM | POA: Diagnosis not present

## 2016-08-11 DIAGNOSIS — F419 Anxiety disorder, unspecified: Secondary | ICD-10-CM | POA: Diagnosis not present

## 2016-08-11 DIAGNOSIS — A09 Infectious gastroenteritis and colitis, unspecified: Secondary | ICD-10-CM | POA: Diagnosis not present

## 2016-08-11 DIAGNOSIS — R8290 Unspecified abnormal findings in urine: Secondary | ICD-10-CM | POA: Diagnosis not present

## 2016-08-11 DIAGNOSIS — K5289 Other specified noninfective gastroenteritis and colitis: Secondary | ICD-10-CM | POA: Diagnosis not present

## 2016-08-11 DIAGNOSIS — Z1389 Encounter for screening for other disorder: Secondary | ICD-10-CM | POA: Diagnosis not present

## 2016-08-11 DIAGNOSIS — Z6824 Body mass index (BMI) 24.0-24.9, adult: Secondary | ICD-10-CM | POA: Diagnosis not present

## 2016-08-11 NOTE — Telephone Encounter (Signed)
I cancelled sonata and send for Seroquel after talking to the patient. She is still waiting for PSG.

## 2016-08-12 DIAGNOSIS — R1013 Epigastric pain: Secondary | ICD-10-CM | POA: Diagnosis not present

## 2016-08-12 DIAGNOSIS — I428 Other cardiomyopathies: Secondary | ICD-10-CM | POA: Diagnosis not present

## 2016-08-12 DIAGNOSIS — R112 Nausea with vomiting, unspecified: Secondary | ICD-10-CM | POA: Diagnosis not present

## 2016-08-12 DIAGNOSIS — R194 Change in bowel habit: Secondary | ICD-10-CM | POA: Diagnosis not present

## 2016-08-12 DIAGNOSIS — R1011 Right upper quadrant pain: Secondary | ICD-10-CM | POA: Diagnosis not present

## 2016-08-12 MED ORDER — ESZOPICLONE 1 MG PO TABS: 2.0000 mg | ORAL_TABLET | Freq: Every evening | ORAL | 0 refills | Status: DC | PRN

## 2016-08-12 NOTE — Addendum Note (Signed)
Addended by: Lester Knowles A on: 08/12/2016 05:00 PM   Modules accepted: Orders

## 2016-08-12 NOTE — Telephone Encounter (Signed)
I had an extended conversation with the pt. She reports that she is already taking 2 tablets of lunesta. She is only getting 4 hours of sleep and feels that she cannot function. I advised her that Dr. Brett Fairy recommends completing the sleep study before we change any medications.  Pt conceded that she may need to see psychiatry to discuss her insomnia.  Pt is asking for a refill on lunesta early since she is taking 2 tablets qhs instead of the prescribed 1 tablet. Will send to Dr. Brett Fairy for review.

## 2016-08-12 NOTE — Telephone Encounter (Signed)
Can try 2 tabs of 1 mg lunesta each,  I would like for her to await the sleep study before  we change any medication!

## 2016-08-12 NOTE — Telephone Encounter (Signed)
Patient is returning your call.  

## 2016-08-12 NOTE — Telephone Encounter (Signed)
I called Kylie Tucker. I advised her that her new lunesta RX was sent to [facility]. Received a receipt of confirmation. Kylie Tucker verbalized understanding.

## 2016-08-12 NOTE — Addendum Note (Signed)
Addended by: Lester Livingston A on: 08/12/2016 04:49 PM   Modules accepted: Orders

## 2016-08-12 NOTE — Telephone Encounter (Signed)
Patient called stated she has taken Seroquel 25mg  for two nights.  Per patient she has not been sleeping with it and it feels as if her heart is stopping and she is having to gasp for air.  Please call

## 2016-08-12 NOTE — Telephone Encounter (Signed)
I called pt to discuss. No answer, left a message asking her to call me back. 

## 2016-08-12 NOTE — Telephone Encounter (Signed)
I called pt. Pt reports that she has taken seroquel for the past 2 days and she has not slept at all and says that her heart feels like it stops an then she is gasping for air.  Pt has tried the lunesta 1mg  and wants to know if she can increase it to 2 mg?

## 2016-08-13 ENCOUNTER — Ambulatory Visit (INDEPENDENT_AMBULATORY_CARE_PROVIDER_SITE_OTHER): Payer: PPO | Admitting: Neurology

## 2016-08-13 DIAGNOSIS — F5105 Insomnia due to other mental disorder: Secondary | ICD-10-CM

## 2016-08-13 DIAGNOSIS — G478 Other sleep disorders: Secondary | ICD-10-CM

## 2016-08-13 DIAGNOSIS — F418 Other specified anxiety disorders: Secondary | ICD-10-CM

## 2016-08-13 DIAGNOSIS — G4726 Circadian rhythm sleep disorder, shift work type: Secondary | ICD-10-CM

## 2016-08-13 DIAGNOSIS — H9313 Tinnitus, bilateral: Secondary | ICD-10-CM

## 2016-08-16 ENCOUNTER — Telehealth: Payer: Self-pay | Admitting: Obstetrics and Gynecology

## 2016-08-16 NOTE — Procedures (Signed)
PATIENT'S NAME:  Kylie Tucker, Kylie Tucker DOB:      July 16, 1950      MR#:    [id number]     DATE OF RECORDING: 08/13/2016 REFERRING M.D.:  Redmond School, MD Study Performed:   Baseline Polysomnogram HISTORY:   Kylie Tucker is a 66 y.o. female , seen upon referral from her gynecologist for " Chronic insomnia". This study is meant to rule out organic causes for insomnia in a patient that has been failed by many sleep aids. Patient reports sleeping only 2-3 hours at a time. 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   Hx: Allergies, anxiety, reflux, hypertension.  The patient endorsed the Epworth Sleepiness Scale at 6 points. FSS endorsed at 32, Depression score 5/15 . The patient's weight 132 pounds with a height of 62 (inches), resulting in a BMI of 24.3 kg/m2. The patient's neck circumference measured 13.5 inches.  CURRENT MEDICATIONS: Alprazolam, Relpax, Effexor, Bystolic   PROCEDURE:  This is a multichannel digital polysomnogram utilizing the Somnostar 11.2 system.  Electrodes and sensors were applied and monitored per AASM Specifications.   EEG, EOG, Chin and Limb EMG, were sampled at 200 Hz.  ECG, Snore and Nasal Pressure, Thermal Airflow, Respiratory Effort, CPAP Flow and Pressure, Oximetry was sampled at 50 Hz. Digital video and audio were recorded.      BASELINE STUDY: Lights Out was at 22:05 and Lights On at 05:01. Total recording time (TRT) was 416.5 minutes, with a total sleep time (TST) of 219 minutes.  The patient's sleep latency was 187.5 minutes.  REM latency was 73.5 minutes.  The sleep efficiency was 52.6 %.     SLEEP ARCHITECTURE: WASO (Wake after sleep onset) was 9.5 minutes.  There were 9 minutes in Stage N1, 119.5 minutes Stage N2, 57 minutes Stage N3 and 33.5 minutes in Stage REM.  The percentage of Stage N1 was 4.1%, Stage N2 was 54.6%, Stage N3 present at 26.% and Stage R (REM sleep) at 15.3%.   RESPIRATORY ANALYSIS:  There  was 1 respiratory event:  0 obstructive apneas, 1 central apnea. The patient also had 0 respiratory event related arousals (RERAs).     The total APNEA/HYPOPNEA INDEX (AHI) was 0.3/hour and the total RESPIRATORY DISTURBANCE INDEX was .3 /hour. 1 event occurred in REM sleep and 0 events in NREM. The REM AHI was 1.8 /hour, versus a non-REM AHI of 0. The patient spent 162 minutes of total sleep time in the supine position and 57 minutes in non-supine. The supine AHI was 0.4 versus a non-supine AHI of 0.0.  OXYGEN SATURATION & C02:  The Wake baseline 02 saturation was 96%, with the lowest being 83%. Time spent below 89% saturation equaled 1 minute. No evidence of hypercapnia.    PERIODIC LIMB MOVEMENTS:   The patient had a total of 0 Periodic Limb Movements.   Post-study, the patient indicated that sleep was the same as usual. Sleep onset was at 1 AM. The arousals were noted as: 15 were spontaneous, 0 were associated with PLMs, and only 1 was associated with respiratory events.   Audio and video analysis did not show any abnormal or unusual movements, behaviors, phonations or vocalizations.   The patient took no bathroom breaks. Very soft snoring was noted. EKG was in keeping with normal sinus rhythm (NSR).  IMPRESSION:  1. Primary, non- organic insomnia. 2. Delayed sleep onset.    RECOMMENDATIONS:  1. Consider dedicated sleep  psychology referral as non-organic sleep initiation insomnia is of clinical concern.   2. A follow up appointment in the Sleep Clinic at [facility] is not necessary. The patient will need instructions in sleep routine and possible bio-feed back.  3. Evaluate for underlying causes of non organic insomnia. The referring provider (Dr. Quincy Simmonds) will be notified of the results.      I certify that I have reviewed the entire raw data recording prior to the issuance of this report in accordance with the Standards of Accreditation of the American Academy of Sleep  Medicine (AASM)     Larey Seat, MD  08-16-2016  Diplomat, American Board of Psychiatry and Neurology  Diplomat, American Board of [place] Director, [place] [facility] [employer]

## 2016-08-17 ENCOUNTER — Telehealth: Payer: Self-pay

## 2016-08-17 DIAGNOSIS — R531 Weakness: Secondary | ICD-10-CM | POA: Diagnosis not present

## 2016-08-17 DIAGNOSIS — R891 Abnormal level of hormones in specimens from other organs, systems and tissues: Secondary | ICD-10-CM | POA: Diagnosis not present

## 2016-08-17 DIAGNOSIS — Z1389 Encounter for screening for other disorder: Secondary | ICD-10-CM | POA: Diagnosis not present

## 2016-08-17 DIAGNOSIS — C50919 Malignant neoplasm of unspecified site of unspecified female breast: Secondary | ICD-10-CM | POA: Diagnosis not present

## 2016-08-17 DIAGNOSIS — Z6823 Body mass index (BMI) 23.0-23.9, adult: Secondary | ICD-10-CM | POA: Diagnosis not present

## 2016-08-17 DIAGNOSIS — K219 Gastro-esophageal reflux disease without esophagitis: Secondary | ICD-10-CM | POA: Diagnosis not present

## 2016-08-17 NOTE — Telephone Encounter (Signed)
-----   Message from Larey Seat, MD sent at 08/16/2016  1:14 PM EST ----- IMPRESSION:  1. Primary, non- organic insomnia. 2. Delayed sleep onset.    RECOMMENDATIONS:  1. Consider dedicated sleep psychology referral as non-organic sleep initiation insomnia is of clinical concern.   2. A follow up appointment in the Sleep Clinic at [facility] is not necessary. The patient will need instructions in sleep routine and possible bio-feed back.  3. Evaluate for underlying causes of non organic insomnia. The referring provider (Dr. Quincy Simmonds) will be notified of the results.

## 2016-08-17 NOTE — Telephone Encounter (Signed)
I spoke to patient and she is aware of results and recommendations. Voiced understanding. I have sent a copy to PCP and referring doctor.

## 2016-08-19 ENCOUNTER — Telehealth: Payer: Self-pay

## 2016-08-19 ENCOUNTER — Telehealth: Payer: Self-pay | Admitting: Neurology

## 2016-08-19 MED ORDER — QUETIAPINE FUMARATE 25 MG PO TABS
25.0000 mg | ORAL_TABLET | Freq: Every day | ORAL | 0 refills | Status: DC
Start: 2016-08-19 — End: 2016-11-28

## 2016-08-19 NOTE — Telephone Encounter (Signed)
90 day supply request

## 2016-08-19 NOTE — Telephone Encounter (Signed)
What do you recommend?

## 2016-08-19 NOTE — Telephone Encounter (Signed)
Patient called in reference to eszopiclone (LUNESTA) 1 MG TABS tablet.  Patient states she is not sleeping while taking the medication.  Patient also would like to see about a referral to a sleep physiatrist vs psychologist.  Please call

## 2016-08-19 NOTE — Telephone Encounter (Signed)
The order for  Sleep psychologist / psychiatrist is long placed in [place]. No refills on sleep aid for non organic sleep disorder. We do not have to follow up. CD

## 2016-08-23 ENCOUNTER — Encounter (HOSPITAL_COMMUNITY): Payer: Self-pay

## 2016-08-23 ENCOUNTER — Telehealth: Payer: Self-pay | Admitting: Neurology

## 2016-08-23 DIAGNOSIS — I428 Other cardiomyopathies: Secondary | ICD-10-CM | POA: Diagnosis not present

## 2016-08-23 DIAGNOSIS — I447 Left bundle-branch block, unspecified: Secondary | ICD-10-CM | POA: Diagnosis not present

## 2016-08-23 DIAGNOSIS — I427 Cardiomyopathy due to drug and external agent: Secondary | ICD-10-CM | POA: Diagnosis not present

## 2016-08-23 DIAGNOSIS — E782 Mixed hyperlipidemia: Secondary | ICD-10-CM | POA: Diagnosis not present

## 2016-08-23 MED ORDER — ESZOPICLONE 1 MG PO TABS: 2.0000 mg | ORAL_TABLET | Freq: Every evening | ORAL | 0 refills | Status: DC | PRN

## 2016-08-23 NOTE — Telephone Encounter (Signed)
Spoke to patient about her referral . Patient wants Lunesta increased today . Patient relayed she has been up for two days and Johnnye Sima is not working. Patient states she can't  function. Please call into pharmacy.  today.  Patient is taking two at night not helping .   Please call patient on her mobile number.

## 2016-08-23 NOTE — Telephone Encounter (Signed)
She was referred , and I will only write this one last prescription for her. No follow up. Cdv

## 2016-08-23 NOTE — Telephone Encounter (Signed)
Patient understands Patient is seeing going to [facility] in three weeks. Patient relayed she just needs a higher dose until her apt.

## 2016-08-24 ENCOUNTER — Ambulatory Visit (HOSPITAL_COMMUNITY): Payer: PPO | Attending: Surgery | Admitting: Physical Therapy

## 2016-08-24 ENCOUNTER — Telehealth: Payer: Self-pay | Admitting: Neurology

## 2016-08-24 DIAGNOSIS — I972 Postmastectomy lymphedema syndrome: Secondary | ICD-10-CM

## 2016-08-24 DIAGNOSIS — M25611 Stiffness of right shoulder, not elsewhere classified: Secondary | ICD-10-CM | POA: Diagnosis not present

## 2016-08-24 NOTE — Telephone Encounter (Signed)
You have already written this for 2 tabs a night, do you feel the need to increase?

## 2016-08-24 NOTE — Telephone Encounter (Signed)
Patient is calling back stating she would like eszopiclone (LUNESTA) 1 MG TABS tablet changed to a higher mg. A Rx was sent to [facility] but the mg was not changed. I advised Dr. Brett Fairy is not in the office but I will send to her nurse.

## 2016-08-24 NOTE — Therapy (Signed)
[place], [place], [address] Phone: [phone]   Fax:  [phone]  Physical Therapy Evaluation  Patient Details  Name: Kylie Tucker MRN: [id number] Date of Birth: 02-Apr-1951 Referring Provider: Alphonsa Tucker  Encounter Date: 08/24/2016      Tucker End of Session - 08/24/16 1623    Visit Number 1   Number of Visits 12   Date for Tucker Re-Evaluation 09/23/16   Authorization Type [employer]   Authorization - Visit Number 1   Authorization - Number of Visits 10   Tucker Start Time 1520   Tucker Stop Time 1615   Tucker Time Calculation (min) 55 min   Activity Tolerance Patient tolerated treatment well   Behavior During Therapy Kylie Tucker for tasks assessed/performed      Past Medical History:  Diagnosis Date  . Allergy   . Anxiety   . Bilateral ovarian cysts    Do yearly ultrasound.  . Cancer ([place]) rt breast   2004/ chemo/ radiation  . Cellulitis and abscess of upper arm and forearm   . Family history of breast cancer   . Family history of rectal cancer   . GERD (gastroesophageal reflux disease)   . H/O Helicobacter infection 2017  . History of right mastectomy 2004   chemo/ radiation  . Hyperlipidemia   . Hypertension   . Left bundle branch block 2016  . Migraines   . PONV (postoperative nausea and vomiting)    cannot take zofran  . Postmastectomy lymphedema rt side  . Sentinel node     Past Surgical History:  Procedure Laterality Date  . BREAST LUMPECTOMY  2004    br bx  . BREAST LUMPECTOMY WITH AXILLARY LYMPH NODE DISSECTION    . CARPAL TUNNEL RELEASE    . CATARACT EXTRACTION Left 07/12/2013  . MANDIBLE SURGERY  1991  . MOUTH SURGERY    . NASAL SINUS SURGERY  1990/2001  . NASAL SINUS SURGERY    . ORIF FINGER FRACTURE  02/14/2012   Procedure: OPEN REDUCTION INTERNAL FIXATION (ORIF) METACARPAL (FINGER) FRACTURE;  Surgeon: Kylie Sours, MD;  Location: [facility];  Service: Orthopedics;   Laterality: Right;  RIGHT FIFTH   . ORIF METACARPAL FRACTURE  8/13   rt   . PORT-A-CATH REMOVAL     in and now out  . REPAIR EXTENSOR TENDON Left 05/30/2013   Procedure: RELEASE TRANSPOSITION EXTENSOR POLLICUS LONGUS LEFT WRIST;  Surgeon: Kylie Sours, MD;  Location: [facility];  Service: Orthopedics;  Laterality: Left;  . rt mastectomy  2004   lymph nodes-7-axillary node dissection  . SHOULDER ARTHROSCOPY WITH ROTATOR CUFF REPAIR AND SUBACROMIAL DECOMPRESSION Left 11/22/2013   Procedure: LEFT SHOULDER ARTHROSCOPY WITH SUBACROMIAL DECOMPRESSION DISTAL CLAVICLE RESECTION REPAIR  ROTATOR CUFF AS NEEDED ;  Surgeon: Kylie Tucker., MD;  Location: [facility];  Service: Orthopedics;  Laterality: Left;  . WRIST ARTHROSCOPY Left 2014    There were no vitals filed for this visit.       Subjective Assessment - 08/24/16 1529    Subjective [name] is a breast cancer survivor who has been diagnosed since 2014 with lymphedema.  She normally is able to wrap herself as well as wear her compression garment but she has been ill for several months and has not been able to complete either of these.  Her arm is now to large for her compression garment.  She has been through  CLT several times before and is being referred for an acute onset of lymphedema. .  She has a pump but does not use it.    Pertinent History T1N1 Rt brease 3/7 nodes positive ; sleep insomina,  recent bacterial infection with 23# weight loss., Lt bundle branch blockage 40%    Currently in Pain? No/denies            Kylie Tucker Tucker Assessment - 08/24/16 0001      Assessment   Medical Diagnosis Rt UE lymphedema   Referring Provider Kylie Tucker   Onset Date/Surgical Date 11/13/02   Prior Therapy 2015      Precautions   Precautions Other (comment)  lymphedema     Restrictions   Weight Bearing Restrictions No     Balance Screen   Has the patient fallen in the past 6 months No   Has the patient  had a decrease in activity level because of a fear of falling?  No   Is the patient reluctant to leave their home because of a fear of falling?  No     Prior Function   Level of Independence Independent   Vocation Retired     Observation/Other Assessments   Focus on Therapeutic Outcomes (FOTO)  --  27/100 lymphedma.      ROM / Strength   AROM / PROM / Strength AROM     AROM   AROM Assessment Site Shoulder   Right/Left Shoulder Right   Right Shoulder Flexion 160 Degrees   Right Shoulder ABduction 160 Degrees   Right Shoulder External Rotation 65 Degrees           LYMPHEDEMA/ONCOLOGY QUESTIONNAIRE - 08/24/16 1541      Surgeries   Mastectomy Date 11/13/02   Number Lymph Nodes Removed 7     Date Lymphedema/Swelling Started   Date 10/14/03     Treatment   Past Chemotherapy Treatment Yes   Past Radiation Treatment Yes   Past Hormone Therapy Yes     What other symptoms do you have   Are you Having Heaviness or Tightness Yes   Are you having Pain No   Are you having pitting edema Yes   Is it Hard or Difficult finding clothes that fit Yes   Do you have infections No     Lymphedema Stage   Stage STAGE 3 ELEPHANTIASIS     Lymphedema Assessments   Lymphedema Assessments Upper extremities     Right Upper Extremity Lymphedema   At Axilla  35.5 cm   15 cm Proximal to Olecranon Process 35 cm   10 cm Proximal to Olecranon Process 32.9 cm   Olecranon Process 28.5 cm   15 cm Proximal to Ulnar Styloid Process 28 cm   10 cm Proximal to Ulnar Styloid Process 24.8 cm   Just Proximal to Ulnar Styloid Process 175 cm   Across Hand at [employer] 19.2 cm   At [place] of 2nd Digit 7 cm   At [facility] of Thumb 7 cm     Left Upper Extremity Lymphedema   At Axilla  33.2 cm   15 cm Proximal to Olecranon Process 32.6 cm   10 cm Proximal to Olecranon Process 29.5 cm   Olecranon Process 25.2 cm   15 cm Proximal to Ulnar Styloid Process 23.6 cm   10 cm Proximal to Ulnar Styloid Process  20.3 cm   Just Proximal to Ulnar Styloid Process 16 cm   Across Hand at [employer]  19 cm   At [facility] of 2nd Digit 6.8 cm   At [facility] of Thumb 7 cm                Kylie Tucker Treatment/Exercise - 08/24/16 0001      Manual Therapy   Manual Therapy Manual Lymphatic Drainage (MLD)   Manual therapy comments seperate from all other skilled care   Manual Lymphatic Drainage (MLD) Completed MLD to include supraclavicular, deep and superfical abdominal; routing fluid using interaxillary and Rt axillary inguinal routes followed by Rt UE.  Completed anteriorly only due to time restraints.                 Tucker Education - 08/24/16 1620    Education provided Yes   Education Details Tucker given what she needed to order in short stretch bandages.    Person(s) Educated Patient   Methods Explanation;Handout   Comprehension Verbalized understanding          Tucker Short Term Goals - 08/24/16 1630      Tucker SHORT TERM GOAL #1   Title Tucker ROM for flexion to be 170 or above to allow Tucker to reach into higher cabinets.    Time 2   Period Weeks   Status New     Tucker SHORT TERM GOAL #2   Title Tucker to understand the benefit of using her compression pump everyday to reduce flair ups of lymphedema.   Time 2   Period Weeks   Status New     Tucker SHORT TERM GOAL #3   Title Be to begin self manual techniques to help to reduce lymphedema   Time 2   Period Weeks   Status New           Tucker Long Term Goals - 08/24/16 1632      Tucker LONG TERM GOAL #1   Title Tucker to have decreased by 2 cm to allow Tucker to don shirts with ease.   Time 4   Period Weeks   Status New     Tucker LONG TERM GOAL #2   Title Tucker to be able to don her compression garment and to verbalize the importance of wearing her garment daily    Time 4   Period Weeks   Status New               Plan - 08/24/16 1623    Clinical Impression Statement Ms. Matovich is a known Tucker to this clinic.  She as been diagnosed with lymphedema  following a Rt masectomy with radiation and chemotherapy in 2005.  She normally is able to control her volume but has been extremely ill over the past several months and had not been able to wrap herself.  In the recent months her Rt UE edema has progressed to where she is unable to wear her compression garment as well.  She is now being referred to skilled physical therapy for CLT.  She states that her bandages are to worn to be useful.  She was given a list of the bandages that she needs and we will begin CLT with bandaging next treatment.  It was also noted that she has decreased ROM we will address this to allow Tucker to have improved movement of her arm.     Rehab Potential Good   Tucker Frequency 3x / week   Tucker Duration 4 weeks   Tucker Treatment/Interventions ADLs/Self Care Home Management;Manual techniques;Therapeutic exercise;Passive range of motion  Tucker Next Visit Plan Begin wand exercises for flexion, abduction and supine IR/ER please give sheets for HEP, Cut foam for Rt UE.  Continue with manual techniques and begin bandaging with foam as soon as bandages are available.    Consulted and Agree with Plan of Care Patient      Patient will benefit from skilled therapeutic intervention in order to improve the following deficits and impairments:  Decreased range of motion, Increased edema  Visit Diagnosis: Postmastectomy lymphedema - Plan: Tucker plan of care cert/re-cert  Stiffness of right shoulder, not elsewhere classified - Plan: Tucker plan of care cert/re-cert      G-Codes - [id number]    Functional Assessment Tool Used Life impact score    Functional Limitation Other Tucker primary   Other Tucker Primary Current Status [id number]) At least 20 percent but less than 40 percent impaired, limited or restricted   Other Tucker Primary Goal Status [id number]) At least 1 percent but less than 20 percent impaired, limited or restricted       Problem List Patient Active Problem List   Diagnosis Date Noted  . Sleep  talking 07/22/2016  . Genetic testing 06/01/2016  . Family history of breast cancer   . Family history of rectal cancer   . Chest pain 09/23/2015  . Abnormal vision 06/13/2015  . Anxiety disorder 06/13/2015  . Cataract cortical, senile 06/13/2015  . Clinical depression 06/13/2015  . Cephalalgia 06/13/2015  . H/O malignant neoplasm of breast 06/13/2015  . 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 Sinus infection 06/13/2015  . Long term current use of systemic steroids 06/13/2015  . Chronic systolic CHF (congestive heart failure), NYHA class 1 ([place]) 05/09/2015  . LBBB (left bundle branch block) 03/26/2015  . Lymphedema of arm 12/13/2013  . Complete rupture of rotator cuff 11/22/2013  . Nipple discharge in female 12/01/2012  . History of breast cancer 11/30/2012  . Epiphora 11/28/2012  . Disorder of endocrine system 04/25/2012  . Ceratitis 04/25/2012  . Blepharitis 04/25/2012  . Breast cancer, right breast ([place])   . FATIGUE 10/01/2009  . 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 Dyspnea 10/01/2009  . HYPERLIPIDEMIA 09/30/2009    Rayetta Humphrey, Tucker CLT [phone] 08/24/2016, 4:38 PM  [facility] [address] [place], [place], [address] Phone: [phone]   Fax:  [phone]  Name: Kylie Tucker MRN: [id number] Date of Birth: [birth year]

## 2016-08-24 NOTE — Telephone Encounter (Signed)
Patient has apt with Alwyn Ren in three weeks referral fax telephone [phone] - fax [phone].

## 2016-08-25 NOTE — Telephone Encounter (Signed)
No further increase.

## 2016-08-26 MED ORDER — ESZOPICLONE 3 MG PO TABS: 3.0000 mg | ORAL_TABLET | Freq: Every evening | ORAL | 0 refills | Status: DC | PRN

## 2016-08-26 NOTE — Telephone Encounter (Signed)
Patient called back, I gave recommendations below.   Patient stated that she has been unable to fill her latest Johnnye Sima Rx because is is written for the same dosage, I confirmed this with the pharmacy (only quantity has been changed).   She states that 3mg  does not work for her, and requested 5mg . I advised her that it does not come in 5mg , and explained the risks of taking too much.   I called [facility], they do not have her on the schedule yet but advised me that they will get her scheduled.   Patient asks for you to call her back and discuss this further.

## 2016-08-26 NOTE — Addendum Note (Signed)
Addended by: Laurence Spates on: 08/26/2016 04:48 PM   Modules accepted: Orders

## 2016-08-26 NOTE — Telephone Encounter (Signed)
This is not longer part of my treatment, I referred for  adequate continuity of care.

## 2016-08-26 NOTE — Telephone Encounter (Signed)
I spoke to Dr. Brett Fairy and patient. Per Dr. Brett Fairy ok to write for Lunesta 3mg  so patient can get it filled. Signed by Dr. Felecia Shelling because Dr. Brett Fairy has left the office. Patient voiced understanding. I asked pharmacy to cancel 1mg  Lunesta Rx on file.

## 2016-09-03 DIAGNOSIS — Z1389 Encounter for screening for other disorder: Secondary | ICD-10-CM | POA: Diagnosis not present

## 2016-09-03 DIAGNOSIS — C50911 Malignant neoplasm of unspecified site of right female breast: Secondary | ICD-10-CM | POA: Diagnosis not present

## 2016-09-03 DIAGNOSIS — Z6825 Body mass index (BMI) 25.0-25.9, adult: Secondary | ICD-10-CM | POA: Diagnosis not present

## 2016-09-03 DIAGNOSIS — I10 Essential (primary) hypertension: Secondary | ICD-10-CM | POA: Diagnosis not present

## 2016-09-03 DIAGNOSIS — F419 Anxiety disorder, unspecified: Secondary | ICD-10-CM | POA: Diagnosis not present

## 2016-09-09 DIAGNOSIS — F5104 Psychophysiologic insomnia: Secondary | ICD-10-CM | POA: Diagnosis not present

## 2016-09-15 ENCOUNTER — Emergency Department (HOSPITAL_COMMUNITY): Payer: PPO

## 2016-09-15 ENCOUNTER — Encounter (HOSPITAL_COMMUNITY): Payer: Self-pay | Admitting: *Deleted

## 2016-09-15 ENCOUNTER — Emergency Department (HOSPITAL_COMMUNITY)
Admission: EM | Admit: 2016-09-15 | Discharge: 2016-09-15 | Disposition: A | Discharge status: Home / Self Care | Payer: PPO | Attending: Emergency Medicine | Admitting: Emergency Medicine

## 2016-09-15 DIAGNOSIS — Z853 Personal history of malignant neoplasm of breast: Secondary | ICD-10-CM | POA: Diagnosis not present

## 2016-09-15 DIAGNOSIS — I5022 Chronic systolic (congestive) heart failure: Secondary | ICD-10-CM | POA: Diagnosis not present

## 2016-09-15 DIAGNOSIS — I110 Hypertensive heart disease with heart failure: Secondary | ICD-10-CM | POA: Diagnosis not present

## 2016-09-15 DIAGNOSIS — R0602 Shortness of breath: Secondary | ICD-10-CM

## 2016-09-15 DIAGNOSIS — Z79899 Other long term (current) drug therapy: Secondary | ICD-10-CM | POA: Diagnosis not present

## 2016-09-15 HISTORY — DX: Insomnia, unspecified: G47.00

## 2016-09-15 LAB — BASIC METABOLIC PANEL
ANION GAP: 5 (ref 5–15)
BUN: 32 mg/dL — ABNORMAL HIGH (ref 6–20)
CALCIUM: 8.6 mg/dL — AB (ref 8.9–10.3)
CHLORIDE: 109 mmol/L (ref 101–111)
CO2: 28 mmol/L (ref 22–32)
Creatinine, Ser: 1.06 mg/dL — ABNORMAL HIGH (ref 0.44–1.00)
GFR calc non Af Amer: 54 mL/min — ABNORMAL LOW (ref >60)
Glucose, Bld: 90 mg/dL (ref 65–99)
Potassium: 4.2 mmol/L (ref 3.5–5.1)
Sodium: 142 mmol/L (ref 135–145)

## 2016-09-15 LAB — TROPONIN I

## 2016-09-15 LAB — CBC
HEMATOCRIT: 38.5 % (ref 36.0–46.0)
HEMOGLOBIN: 12.5 g/dL (ref 12.0–15.0)
MCH: 30 pg (ref 26.0–34.0)
MCHC: 32.5 g/dL (ref 30.0–36.0)
MCV: 92.3 fL (ref 78.0–100.0)
Platelets: 243 10*3/uL (ref 150–400)
RBC: 4.17 MIL/uL (ref 3.87–5.11)
RDW: 15.1 % (ref 11.5–15.5)
WBC: 7.4 10*3/uL (ref 4.0–10.5)

## 2016-09-15 MED ORDER — SODIUM CHLORIDE 0.9 % IV BOLUS (SEPSIS): 1000.0000 mL | Freq: Once | INTRAVENOUS | Status: DC

## 2016-09-15 NOTE — ED Provider Notes (Signed)
[facility] DEPT Provider Note   CSN: [id number] Arrival date & time: 09/15/16  1135  By signing my name below, I, Neta Mends, attest that this documentation has been prepared under the direction and in the presence of Jola Schmidt, MD . Electronically Signed: Neta Mends, ED Scribe. 09/15/2016. 12:14 PM.    History   Chief Complaint Chief Complaint  Patient presents with  . Shallow breathing    The history is provided by the patient. No language interpreter was used.   HPI Comments:  Kylie Tucker is a 66 y.o. female with PMHx of left bundle branch block who presents to the Emergency Department complaining of constant SOB x 3-4 days. Pt states that she is having "shallow breathing" and that she feels like she can't take a full breath. She states that this is made worse with laying flat. She also reports that she feels that he potassium is low because she is having decreased and darker urine. She states that she has had low K levels previously but has never had any K supplementation. Husband reports that pt has an eating disorder. Pt denies any previous similar episodes of SOB, or hx of DVT/PE, COPD, emphysema, or asthma. No alleviating factors noted. Pt denies fever, leg swelling, chest pain.    Past Medical History:  Diagnosis Date  . Allergy   . Anxiety   . Bilateral ovarian cysts    Do yearly ultrasound.  . Cancer ([place]) rt breast   2004/ chemo/ radiation  . Cellulitis and abscess of upper arm and forearm   . Family history of breast cancer   . Family history of rectal cancer   . GERD (gastroesophageal reflux disease)   . H/O Helicobacter infection 2017  . History of right mastectomy 2004   chemo/ radiation  . Hyperlipidemia   . Hypertension   . Insomnia   . Left bundle branch block 2016  . Migraines   . PONV (postoperative nausea and vomiting)    cannot take zofran  . Postmastectomy lymphedema rt side  . Sentinel node     Patient Active Problem  List   Diagnosis Date Noted  . Sleep talking 07/22/2016  . Genetic testing 06/01/2016  . Family history of breast cancer   . Family history of rectal cancer   . Chest pain 09/23/2015  . Abnormal vision 06/13/2015  . Anxiety disorder 06/13/2015  . Cataract cortical, senile 06/13/2015  . Clinical depression 06/13/2015  . Cephalalgia 06/13/2015  . H/O malignant neoplasm of breast 06/13/2015  . 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 Sinus infection 06/13/2015  . Long term current use of systemic steroids 06/13/2015  . Chronic systolic CHF (congestive heart failure), NYHA class 1 ([place]) 05/09/2015  . LBBB (left bundle branch block) 03/26/2015  . Lymphedema of arm 12/13/2013  . Complete rupture of rotator cuff 11/22/2013  . Nipple discharge in female 12/01/2012  . History of breast cancer 11/30/2012  . Epiphora 11/28/2012  . Disorder of endocrine system 04/25/2012  . Ceratitis 04/25/2012  . Blepharitis 04/25/2012  . Breast cancer, right breast ([place])   . FATIGUE 10/01/2009  . 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 Dyspnea 10/01/2009  . HYPERLIPIDEMIA 09/30/2009    Past Surgical History:  Procedure Laterality Date  . BREAST LUMPECTOMY  2004    br bx  . BREAST LUMPECTOMY WITH AXILLARY LYMPH NODE DISSECTION    . CARPAL TUNNEL RELEASE    . CATARACT EXTRACTION Left 07/12/2013  . MANDIBLE SURGERY  1991  .  MOUTH SURGERY    . NASAL SINUS SURGERY  1990/2001  . NASAL SINUS SURGERY    . ORIF FINGER FRACTURE  02/14/2012   Procedure: OPEN REDUCTION INTERNAL FIXATION (ORIF) METACARPAL (FINGER) FRACTURE;  Surgeon: Wynonia Sours, MD;  Location: [facility];  Service: Orthopedics;  Laterality: Right;  RIGHT FIFTH   . ORIF METACARPAL FRACTURE  8/13   rt   . PORT-A-CATH REMOVAL     in and now out  . REPAIR EXTENSOR TENDON Left 05/30/2013   Procedure: RELEASE TRANSPOSITION EXTENSOR POLLICUS LONGUS LEFT WRIST;  Surgeon: Wynonia Sours, MD;  Location: [facility];  Service: Orthopedics;   Laterality: Left;  . rt mastectomy  2004   lymph nodes-7-axillary node dissection  . SHOULDER ARTHROSCOPY WITH ROTATOR CUFF REPAIR AND SUBACROMIAL DECOMPRESSION Left 11/22/2013   Procedure: LEFT SHOULDER ARTHROSCOPY WITH SUBACROMIAL DECOMPRESSION DISTAL CLAVICLE RESECTION REPAIR  ROTATOR CUFF AS NEEDED ;  Surgeon: Cammie Sickle., MD;  Location: [facility];  Service: Orthopedics;  Laterality: Left;  . WRIST ARTHROSCOPY Left 2014    OB History    Gravida Para Term Preterm AB Living   3 2     1 2    SAB TAB Ectopic Multiple Live Births   0 1             Home Medications    Prior to Admission medications   Medication Sig Start Date End Date Taking? Authorizing Provider  ALPRAZOLAM XR 2 MG 24 hr tablet Take 2 mg by mouth daily.  09/27/15   Historical Provider, MD  calcium carbonate (OS-CAL) 600 MG TABS Take 600 mg by mouth 2 (two) times daily with a meal.      Historical Provider, MD  Cholecalciferol (VITAMIN D) 2000 UNITS CAPS Take 1 capsule by mouth daily.     Historical Provider, MD  eletriptan (RELPAX) 40 MG tablet Take 40 mg by mouth as needed for migraine or headache. Reported on 10/20/2015    Historical Provider, MD  eszopiclone 3 MG TABS Take 1 tablet (3 mg total) by mouth at bedtime as needed for sleep. Take immediately before bedtime 08/26/16   Britt Bottom, MD  Multiple Vitamins-Minerals (MULTIVITAMINS THER. W/MINERALS) TABS Take 1 tablet by mouth daily.      Historical Provider, MD  nebivolol (BYSTOLIC) 5 MG tablet Take 1 tablet (5 mg total) by mouth daily. Patient not taking: Reported on 07/22/2016 01/22/16   Thayer Headings, MD  Probiotic Product (PROBIOTIC DAILY PO) Take 1 tablet by mouth daily.     Historical Provider, MD  QUEtiapine (SEROQUEL) 25 MG tablet Take 1 tablet (25 mg total) by mouth at bedtime. 08/19/16   Larey Seat, MD    Family History Family History  Problem Relation Age of Onset  . Heart disease Father   . Heart disease Mother   .  Hypertension Mother   . Rectal cancer Mother     dx in her early 84s  . Colon polyps Mother   . Breast cancer Maternal Aunt     dx in her early 25s  . Heart disease Paternal Uncle   . Heart disease Paternal Grandmother   . Heart disease Paternal Grandfather   . Diabetes Brother   . Breast cancer Maternal Grandmother     dx in her 26s  . Breast cancer Cousin     dx in early 76s  . Breast cancer Cousin 46  . Hepatitis C Son   .  Hypertension Son   . Post-traumatic stress disorder Son     Social History Social History  Substance Use Topics  . Smoking status: Never Smoker  . Smokeless tobacco: Never Used  . Alcohol use No     Allergies   Acetaminophen; Amoxicillin-pot clavulanate; Azithromycin; Cefuroxime axetil; Cephalexin; Hydrocodone-acetaminophen; Hydrocodone-acetaminophen; Levofloxacin; Morphine and related; Ondansetron; Propoxyphene n-acetaminophen; Rosuvastatin; Sulfonamide derivatives; Tramadol; Trazodone and nefazodone; Vioxx [rofecoxib]; Zofran [ondansetron hcl]; Clarithromycin; Codeine; Seroquel [quetiapine fumarate]; and Sulfamethoxazole   Review of Systems Review of Systems  Constitutional: Negative for fever.  Respiratory: Positive for shortness of breath.   Cardiovascular: Negative for chest pain.  Genitourinary: Negative for decreased urine volume.  Musculoskeletal: Negative for joint swelling.  All other systems reviewed and are negative.    Physical Exam Updated Vital Signs BP (!) 137/54   Pulse 86   Temp 98 F (36.7 C) (Oral)   Resp 18   Ht 5' 2.25" (1.581 m)   Wt 140 lb (63.5 kg)   LMP 01/10/2003   SpO2 99%   BMI 25.40 kg/m   Physical Exam  Constitutional: She is oriented to person, place, and time. She appears well-developed and well-nourished. No distress.  HENT:  Head: Normocephalic and atraumatic.  Eyes: EOM are normal.  Neck: Normal range of motion.  Cardiovascular: Normal rate, regular rhythm and normal heart sounds.     Pulmonary/Chest: Effort normal and breath sounds normal.  Abdominal: Soft. She exhibits no distension. There is no tenderness.  Musculoskeletal: Normal range of motion.  Neurological: She is alert and oriented to person, place, and time.  Skin: Skin is warm and dry.  Psychiatric: She has a normal mood and affect. Judgment normal.  Nursing note and vitals reviewed.    ED Treatments / Results  DIAGNOSTIC STUDIES:  Oxygen Saturtion is 99% on RA, normal by my interpretation.    COORDINATION OF CARE:  12:13 PM  Discussed treatment plan with pt at bedside and pt agreed to plan.   Labs (all labs ordered are listed, but only abnormal results are displayed) Labs Reviewed  BASIC METABOLIC PANEL - Abnormal; Notable for the following:       Result Value   BUN 32 (*)    Creatinine, Ser 1.06 (*)    Calcium 8.6 (*)    GFR calc non Af Amer 54 (*)    All other components within normal limits  CBC  TROPONIN I    EKG  EKG Interpretation  Date/Time:  Wednesday September 15 2016 12:04:12 EST Ventricular Rate:  80 PR Interval:    QRS Duration: 142 QT Interval:  437 QTC Calculation: 505 R Axis:   -12 Text Interpretation:  Sinus rhythm Left bundle branch block Baseline wander in lead(s) V1 V2 No significant change was found Confirmed by Neilson Oehlert  MD, Canden Cieslinski ([id number]) on 09/15/2016 12:41:06 PM       Radiology Dg Chest 2 View  Result Date: 09/15/2016 CLINICAL DATA:  Shallow breathing last night.  Shortness of breath. EXAM: CHEST  2 VIEW COMPARISON:  12/18/2015 FINDINGS: The heart size and mediastinal contours are within normal limits. Both lungs are clear. The visualized skeletal structures are unremarkable. IMPRESSION: No active cardiopulmonary disease. Electronically Signed   By: Kathreen Devoid   On: 09/15/2016 13:02    Procedures Procedures (including critical care time)  Medications Ordered in ED Medications - No data to display   Initial Impression / Assessment and Plan / ED Course  I  have reviewed the triage vital signs and the  nursing notes.  Pertinent labs & imaging results that were available during my care of the patient were reviewed by me and considered in my medical decision making (see chart for details).     1:57 PM Workup without significant abnormality.  Chest x-ray clear.  No hypoxia.  Vital signs are stable.  I personally walked with patient around the emergency department.  She had no increased work of breathing.  Pulse ox after walking was 98 and heart rate was 87.  No clear etiology for her subjective complaint.  Everything objectively looks normal.  Vital signs are normal.  Discharge home in good condition with primary care follow-up.  Final Clinical Impressions(s) / ED Diagnoses   Final diagnoses:  Shortness of breath    New Prescriptions New Prescriptions   No medications on file   I personally performed the services described in this documentation, which was scribed in my presence. The recorded information has been reviewed and is accurate.        Jola Schmidt, MD 09/15/16 1357

## 2016-09-15 NOTE — ED Triage Notes (Signed)
Pt says she had shallow breathing last night.  Denies any chest of SOB at this time.  Pt skin is pink.  No distress noted.  Pt walked in from car with husband.

## 2016-09-15 NOTE — ED Notes (Signed)
Pt given water 

## 2016-09-16 ENCOUNTER — Telehealth (HOSPITAL_COMMUNITY): Payer: Self-pay | Admitting: Physical Therapy

## 2016-09-16 NOTE — Telephone Encounter (Signed)
L/m offered pt 3/15 @ 4:45 with Amy- will have to move Amy's patient to [facility] if Kechia agrees to come on this date, she doesn't like mornings. NF 09/16/16

## 2016-09-16 NOTE — Telephone Encounter (Signed)
error 

## 2016-09-19 ENCOUNTER — Emergency Department (HOSPITAL_COMMUNITY)
Admission: EM | Admit: 2016-09-19 | Discharge: 2016-09-19 | Disposition: A | Discharge status: Home / Self Care | Payer: PPO | Attending: Emergency Medicine | Admitting: Emergency Medicine

## 2016-09-19 ENCOUNTER — Encounter (HOSPITAL_COMMUNITY): Payer: Self-pay | Admitting: Emergency Medicine

## 2016-09-19 ENCOUNTER — Emergency Department (HOSPITAL_COMMUNITY): Payer: PPO

## 2016-09-19 DIAGNOSIS — I110 Hypertensive heart disease with heart failure: Secondary | ICD-10-CM | POA: Diagnosis not present

## 2016-09-19 DIAGNOSIS — R072 Precordial pain: Secondary | ICD-10-CM | POA: Diagnosis not present

## 2016-09-19 DIAGNOSIS — R0789 Other chest pain: Secondary | ICD-10-CM | POA: Diagnosis not present

## 2016-09-19 DIAGNOSIS — I5022 Chronic systolic (congestive) heart failure: Secondary | ICD-10-CM | POA: Diagnosis not present

## 2016-09-19 DIAGNOSIS — Z853 Personal history of malignant neoplasm of breast: Secondary | ICD-10-CM | POA: Diagnosis not present

## 2016-09-19 DIAGNOSIS — R10817 Generalized abdominal tenderness: Secondary | ICD-10-CM | POA: Insufficient documentation

## 2016-09-19 DIAGNOSIS — R079 Chest pain, unspecified: Secondary | ICD-10-CM | POA: Diagnosis not present

## 2016-09-19 LAB — CBC
HEMATOCRIT: 42.3 % (ref 36.0–46.0)
Hemoglobin: 13.5 g/dL (ref 12.0–15.0)
MCH: 29.6 pg (ref 26.0–34.0)
MCHC: 31.9 g/dL (ref 30.0–36.0)
MCV: 92.8 fL (ref 78.0–100.0)
Platelets: 286 10*3/uL (ref 150–400)
RBC: 4.56 MIL/uL (ref 3.87–5.11)
RDW: 15.2 % (ref 11.5–15.5)
WBC: 9.3 10*3/uL (ref 4.0–10.5)

## 2016-09-19 LAB — BASIC METABOLIC PANEL
ANION GAP: 10 (ref 5–15)
BUN: 33 mg/dL — ABNORMAL HIGH (ref 6–20)
CO2: 26 mmol/L (ref 22–32)
Calcium: 9.1 mg/dL (ref 8.9–10.3)
Chloride: 105 mmol/L (ref 101–111)
Creatinine, Ser: 0.93 mg/dL (ref 0.44–1.00)
GFR calc Af Amer: >60 mL/min (ref >60)
GFR calc non Af Amer: >60 mL/min (ref >60)
GLUCOSE: 96 mg/dL (ref 65–99)
POTASSIUM: 4 mmol/L (ref 3.5–5.1)
Sodium: 141 mmol/L (ref 135–145)

## 2016-09-19 LAB — HEPATIC FUNCTION PANEL
ALBUMIN: 3.6 g/dL (ref 3.5–5.0)
ALK PHOS: 81 U/L (ref 38–126)
ALT: 56 U/L — ABNORMAL HIGH (ref 14–54)
AST: 40 U/L (ref 15–41)
BILIRUBIN TOTAL: 0.5 mg/dL (ref 0.3–1.2)
Bilirubin, Direct: 0.1 mg/dL (ref 0.1–0.5)
Indirect Bilirubin: 0.4 mg/dL (ref 0.3–0.9)
Total Protein: 6.5 g/dL (ref 6.5–8.1)

## 2016-09-19 LAB — I-STAT TROPONIN, ED
Troponin i, poc: 0.01 ng/mL (ref 0.00–0.08)
Troponin i, poc: 0 ng/mL (ref 0.00–0.08)

## 2016-09-19 LAB — LIPASE, BLOOD: Lipase: 21 U/L (ref 11–51)

## 2016-09-19 LAB — D-DIMER, QUANTITATIVE: D-Dimer, Quant: 0.42 ug/mL-FEU (ref 0.00–0.50)

## 2016-09-19 MED ORDER — SODIUM CHLORIDE 0.9 % IV BOLUS (SEPSIS)
250.0000 mL | Freq: Once | INTRAVENOUS | Status: AC
  Administered 2016-09-19: 250 mL via INTRAVENOUS

## 2016-09-19 MED ORDER — ALUM & MAG HYDROXIDE-SIMETH 200-200-20 MG/5ML PO SUSP
15.0000 mL | Freq: Once | ORAL | Status: AC
  Administered 2016-09-19: 15 mL via ORAL
  Filled 2016-09-19: qty 30

## 2016-09-19 NOTE — ED Triage Notes (Addendum)
Patient woke up this morning around 0200, stated that she felt like she was unable to breath.  She then started having chest pain.  She states she still has the pain at this time.  Patient states she is having some abdominal pain now.

## 2016-09-19 NOTE — ED Notes (Signed)
Papers reviewed with patient and she verbalizes understanding

## 2016-09-19 NOTE — ED Provider Notes (Signed)
[facility] DEPT Provider Note   CSN: [id number] Arrival date & time: 09/19/16  0551     History   Chief Complaint Chief Complaint  Patient presents with  . Chest Pain  . Shortness of Breath    HPI Kylie Tucker is a 66 y.o. female.  The history is provided by the patient. No language interpreter was used.  Chest Pain   Associated symptoms include shortness of breath.  Shortness of Breath  Associated symptoms include chest pain.   Kylie Tucker is a 66 y.o. female who presents to the Emergency Department complaining of chest pain, sob.  She presents for evaluation of chest pain and shortness of breath that started last night. She has experienced intermittent episodes of chest discomfort as well as abdominal discomfort since last April. She reports a sensation of shallow respirations with central chest discomfort described as heart pain. Her generalized abdominal pain is waxing and waning over the last year with no clear alleviating or worsening factors. She also developed associated dizziness, diaphoresis, malaise, tingling on her right face. No fever, lower extremity swelling or pain. She does have a history of left bundle-branch block and has had a prior heart cath 7 years ago by Dr. Einar Gip. Past Medical History:  Diagnosis Date  . Allergy   . Anxiety   . Bilateral ovarian cysts    Do yearly ultrasound.  . Cancer ([place]) rt breast   2004/ chemo/ radiation  . Cellulitis and abscess of upper arm and forearm   . Family history of breast cancer   . Family history of rectal cancer   . GERD (gastroesophageal reflux disease)   . H/O Helicobacter infection 2017  . History of right mastectomy 2004   chemo/ radiation  . Hyperlipidemia   . Hypertension   . Insomnia   . Left bundle branch block 2016  . Migraines   . PONV (postoperative nausea and vomiting)    cannot take zofran  . Postmastectomy lymphedema rt side  . Sentinel node     Patient Active Problem List   Diagnosis Date Noted  . Sleep talking 07/22/2016  . Genetic testing 06/01/2016  . Family history of breast cancer   . Family history of rectal cancer   . Chest pain 09/23/2015  . Abnormal vision 06/13/2015  . Anxiety disorder 06/13/2015  . Cataract cortical, senile 06/13/2015  . Clinical depression 06/13/2015  . Cephalalgia 06/13/2015  . H/O malignant neoplasm of breast 06/13/2015  . 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 Sinus infection 06/13/2015  . Long term current use of systemic steroids 06/13/2015  . Chronic systolic CHF (congestive heart failure), NYHA class 1 ([place]) 05/09/2015  . LBBB (left bundle branch block) 03/26/2015  . Lymphedema of arm 12/13/2013  . Complete rupture of rotator cuff 11/22/2013  . Nipple discharge in female 12/01/2012  . History of breast cancer 11/30/2012  . Epiphora 11/28/2012  . Disorder of endocrine system 04/25/2012  . Ceratitis 04/25/2012  . Blepharitis 04/25/2012  . Breast cancer, right breast ([place])   . FATIGUE 10/01/2009  . 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 Dyspnea 10/01/2009  . HYPERLIPIDEMIA 09/30/2009    Past Surgical History:  Procedure Laterality Date  . BREAST LUMPECTOMY  2004    br bx  . BREAST LUMPECTOMY WITH AXILLARY LYMPH NODE DISSECTION    . CARPAL TUNNEL RELEASE    . CATARACT EXTRACTION Left 07/12/2013  . MANDIBLE SURGERY  1991  . MOUTH SURGERY    . NASAL SINUS SURGERY  1990/2001  .  NASAL SINUS SURGERY    . ORIF FINGER FRACTURE  02/14/2012   Procedure: OPEN REDUCTION INTERNAL FIXATION (ORIF) METACARPAL (FINGER) FRACTURE;  Surgeon: Wynonia Sours, MD;  Location: [facility];  Service: Orthopedics;  Laterality: Right;  RIGHT FIFTH   . ORIF METACARPAL FRACTURE  8/13   rt   . PORT-A-CATH REMOVAL     in and now out  . REPAIR EXTENSOR TENDON Left 05/30/2013   Procedure: RELEASE TRANSPOSITION EXTENSOR POLLICUS LONGUS LEFT WRIST;  Surgeon: Wynonia Sours, MD;  Location: [facility];  Service: Orthopedics;  Laterality:  Left;  . rt mastectomy  2004   lymph nodes-7-axillary node dissection  . SHOULDER ARTHROSCOPY WITH ROTATOR CUFF REPAIR AND SUBACROMIAL DECOMPRESSION Left 11/22/2013   Procedure: LEFT SHOULDER ARTHROSCOPY WITH SUBACROMIAL DECOMPRESSION DISTAL CLAVICLE RESECTION REPAIR  ROTATOR CUFF AS NEEDED ;  Surgeon: Cammie Sickle., MD;  Location: [facility];  Service: Orthopedics;  Laterality: Left;  . WRIST ARTHROSCOPY Left 2014    OB History    Gravida Para Term Preterm AB Living   3 2     1 2    SAB TAB Ectopic Multiple Live Births   0 1             Home Medications    Prior to Admission medications   Medication Sig Start Date End Date Taking? Authorizing Provider  ALPRAZOLAM XR 3 MG 24 hr tablet Take 3 mg by mouth at bedtime. 06/30/16  Yes Historical Provider, MD  Cholecalciferol (VITAMIN D) 2000 UNITS CAPS Take 1 capsule by mouth daily.    Yes Historical Provider, MD  DOCOSAHEXAENOIC ACID PO Take 1 capsule by mouth daily.   Yes Historical Provider, MD  doxepin (SINEQUAN) 10 MG capsule Take 10 mg by mouth at bedtime.  09/16/16  Yes Historical Provider, MD  eletriptan (RELPAX) 40 MG tablet Take 40 mg by mouth as needed for migraine or headache. Reported on 10/20/2015   Yes Historical Provider, MD  eszopiclone 3 MG TABS Take 1 tablet (3 mg total) by mouth at bedtime as needed for sleep. Take immediately before bedtime 08/26/16  Yes Britt Bottom, MD  metoprolol succinate (TOPROL-XL) 50 MG 24 hr tablet Take 1 tablet by mouth daily. 08/23/16  Yes Historical Provider, MD  Multiple Vitamins-Minerals (MULTIVITAMINS THER. W/MINERALS) TABS Take 1 tablet by mouth daily.     Yes Historical Provider, MD  Probiotic Product (PROBIOTIC DAILY PO) Take 1 tablet by mouth daily.    Yes Historical Provider, MD  nebivolol (BYSTOLIC) 5 MG tablet Take 1 tablet (5 mg total) by mouth daily. Patient not taking: Reported on 07/22/2016 01/22/16   Thayer Headings, MD  QUEtiapine (SEROQUEL) 25 MG tablet Take 1  tablet (25 mg total) by mouth at bedtime. Patient not taking: Reported on 09/15/2016 08/19/16   Larey Seat, MD    Family History Family History  Problem Relation Age of Onset  . Heart disease Father   . Heart disease Mother   . Hypertension Mother   . Rectal cancer Mother     dx in her early 58s  . Colon polyps Mother   . Breast cancer Maternal Aunt     dx in her early 39s  . Heart disease Paternal Uncle   . Heart disease Paternal Grandmother   . Heart disease Paternal Grandfather   . Diabetes Brother   . Breast cancer Maternal Grandmother     dx in her 65s  . Breast  cancer Cousin     dx in early 79s  . Breast cancer Cousin 14  . Hepatitis C Son   . Hypertension Son   . Post-traumatic stress disorder Son     Social History Social History  Substance Use Topics  . Smoking status: Never Smoker  . Smokeless tobacco: Never Used  . Alcohol use No     Allergies   Acetaminophen; Amoxicillin-pot clavulanate; Azithromycin; Cefuroxime axetil; Cephalexin; Clonazepam; Hydrocodone-acetaminophen; Hydrocodone-acetaminophen; Levofloxacin; Morphine and related; Ondansetron; Propoxyphene n-acetaminophen; Rosuvastatin; Sulfonamide derivatives; Tramadol; Trazodone and nefazodone; Vioxx [rofecoxib]; Zofran [ondansetron hcl]; Clarithromycin; Codeine; Seroquel [quetiapine fumarate]; and Sulfamethoxazole   Review of Systems Review of Systems  Respiratory: Positive for shortness of breath.   Cardiovascular: Positive for chest pain.  All other systems reviewed and are negative.    Physical Exam Updated Vital Signs BP 176/83   Pulse 89   Temp 97.9 F (36.6 C) (Oral)   Resp 16   LMP 01/10/2003   SpO2 96%   Physical Exam  Constitutional: She is oriented to person, place, and time. She appears well-developed and well-nourished.  HENT:  Head: Normocephalic and atraumatic.  Cardiovascular: Normal rate and regular rhythm.   No murmur heard. Pulmonary/Chest: Effort normal and breath  sounds normal. No respiratory distress.  Abdominal: Soft. There is no rebound and no guarding.  Mild to moderate diffuse abdominal tenderness  Musculoskeletal: She exhibits no edema or tenderness.  Neurological: She is alert and oriented to person, place, and time.  Skin: Skin is warm and dry.  Psychiatric: She has a normal mood and affect. Her behavior is normal.  Nursing note and vitals reviewed.    ED Treatments / Results  Labs (all labs ordered are listed, but only abnormal results are displayed) Labs Reviewed  BASIC METABOLIC PANEL - Abnormal; Notable for the following:       Result Value   BUN 33 (*)    All other components within normal limits  HEPATIC FUNCTION PANEL - Abnormal; Notable for the following:    ALT 56 (*)    All other components within normal limits  CBC  LIPASE, BLOOD  D-DIMER, QUANTITATIVE (NOT AT [facility])  I-STAT TROPOININ, ED  I-STAT TROPOININ, ED    EKG  EKG Interpretation  Date/Time:  Sunday September 19 2016 05:58:40 EDT Ventricular Rate:  88 PR Interval:  152 QRS Duration: 136 QT Interval:  418 QTC Calculation: 505 R Axis:   -55 Text Interpretation:  Normal sinus rhythm Left axis deviation Left bundle branch block Abnormal ECG Wider QRS than prior Confirmed by HORTON  MD, Loma Sousa ([id number]) on 09/19/2016 6:11:59 AM       Radiology Dg Chest 2 View  Result Date: 09/19/2016 CLINICAL DATA:  Intermittent left-sided chest pain, onset at 01:30. EXAM: CHEST  2 VIEW COMPARISON:  09/15/2016 FINDINGS: Unchanged borderline cardiomegaly. The lungs are clear. Pulmonary vasculature is normal. No pleural effusions. Hilar and mediastinal contours are unremarkable and unchanged. IMPRESSION: Stable borderline cardiomegaly. No consolidation or effusion. Normal vasculature. Electronically Signed   By: Andreas Newport M.D.   On: 09/19/2016 06:44    Procedures Procedures (including critical care time)  Medications Ordered in ED Medications  sodium chloride 0.9 %  bolus 250 mL (0 mLs Intravenous Stopped 09/19/16 1059)  alum & mag hydroxide-simeth (MAALOX/MYLANTA) 200-200-20 MG/5ML suspension 15 mL (15 mLs Oral Given 09/19/16 1034)     Initial Impression / Assessment and Plan / ED Course  I have reviewed the triage vital signs and the nursing notes.  Pertinent labs & imaging results that were available during my care of the patient were reviewed by me and considered in my medical decision making (see chart for details).  Clinical Course as of Sep 20 1538  Sun Sep 19, 2016  1010 Pt updated of findings of studies.  Discussed case with Dr. Einar Gip, patient's Cardiologist.  Plan to repeat troponin.  [ER]    Clinical Course User Index [ER] Quintella Reichert, MD  Patient here for evaluation of chest pain, diaphoresis, shortness of breath, abdominal pain. Symptoms have been waxing and waning for several months to a year. She is in no distress in the emergency department. EKG demonstrated left bundle branch block that is similar compared to priors. Troponin 2 is negative. Current clinical picture is not consistent with PE, ACS, dissection, acute abdomen. Counseled patient on home care for chest pain. Discussed outpatient follow-up and return precautions.  Final Clinical Impressions(s) / ED Diagnoses   Final diagnoses:  Precordial pain    New Prescriptions New Prescriptions   No medications on file     Quintella Reichert, MD 09/19/16 1541

## 2016-09-21 ENCOUNTER — Telehealth (HOSPITAL_COMMUNITY): Payer: Self-pay | Admitting: Physical Therapy

## 2016-09-21 ENCOUNTER — Ambulatory Visit (HOSPITAL_COMMUNITY): Payer: PPO | Attending: Surgery | Admitting: Physical Therapy

## 2016-09-21 DIAGNOSIS — M25611 Stiffness of right shoulder, not elsewhere classified: Secondary | ICD-10-CM

## 2016-09-21 DIAGNOSIS — I972 Postmastectomy lymphedema syndrome: Secondary | ICD-10-CM | POA: Diagnosis not present

## 2016-09-21 NOTE — Telephone Encounter (Signed)
talked with pt confirmed her apptment date and time. She will be here according to her schedule and we will print a new print out for her when she arrives. NF

## 2016-09-21 NOTE — Therapy (Signed)
[facility] [address] [place], [place], [address] Phone: [phone]   Fax:  [phone]  Physical Therapy Treatment  Patient Details  Name: Kylie Tucker MRN: [id number] Date of Birth: [birth year] Referring Provider: Alphonsa Overall  Encounter Date: 09/21/2016      PT End of Session - 09/21/16 1656    Visit Number 2   Number of Visits 12   Date for PT Re-Evaluation 09/23/16   Authorization Type [employer]   Authorization - Visit Number 2   Authorization - Number of Visits 10   PT Start Time 6269   PT Stop Time 1750   PT Time Calculation (min) 60 min   Activity Tolerance Patient tolerated treatment well   Behavior During Therapy [facility] for tasks assessed/performed      Past Medical History:  Diagnosis Date  . Allergy   . Anxiety   . Bilateral ovarian cysts    Do yearly ultrasound.  . Cancer ([facility]) rt breast   2004/ chemo/ radiation  . Cellulitis and abscess of upper arm and forearm   . Family history of breast cancer   . Family history of rectal cancer   . GERD (gastroesophageal reflux disease)   . H/O Helicobacter infection 2017  . History of right mastectomy 2004   chemo/ radiation  . Hyperlipidemia   . Hypertension   . Insomnia   . Left bundle branch block 2016  . Migraines   . PONV (postoperative nausea and vomiting)    cannot take zofran  . Postmastectomy lymphedema rt side  . Sentinel node     Past Surgical History:  Procedure Laterality Date  . BREAST LUMPECTOMY  2004    br bx  . BREAST LUMPECTOMY WITH AXILLARY LYMPH NODE DISSECTION    . CARPAL TUNNEL RELEASE    . CATARACT EXTRACTION Left 07/12/2013  . MANDIBLE SURGERY  1991  . MOUTH SURGERY    . NASAL SINUS SURGERY  1990/2001  . NASAL SINUS SURGERY    . ORIF FINGER FRACTURE  02/14/2012   Procedure: OPEN REDUCTION INTERNAL FIXATION (ORIF) METACARPAL (FINGER) FRACTURE;  Surgeon: Wynonia Sours, MD;  Location: [facility];  Service:  Orthopedics;  Laterality: Right;  RIGHT FIFTH   . ORIF METACARPAL FRACTURE  8/13   rt   . PORT-A-CATH REMOVAL     in and now out  . REPAIR EXTENSOR TENDON Left 05/30/2013   Procedure: RELEASE TRANSPOSITION EXTENSOR POLLICUS LONGUS LEFT WRIST;  Surgeon: Wynonia Sours, MD;  Location: [facility];  Service: Orthopedics;  Laterality: Left;  . rt mastectomy  2004   lymph nodes-7-axillary node dissection  . SHOULDER ARTHROSCOPY WITH ROTATOR CUFF REPAIR AND SUBACROMIAL DECOMPRESSION Left 11/22/2013   Procedure: LEFT SHOULDER ARTHROSCOPY WITH SUBACROMIAL DECOMPRESSION DISTAL CLAVICLE RESECTION REPAIR  ROTATOR CUFF AS NEEDED ;  Surgeon: Cammie Sickle., MD;  Location: [facility];  Service: Orthopedics;  Laterality: Left;  . WRIST ARTHROSCOPY Left 2014    There were no vitals filed for this visit.      Subjective Assessment - 09/21/16 1751    Subjective Pt comes today with new bandages for Rt UE.  STates she has had alot of health issues the past 4 weeks and that is why she has not been back to therapy.  States she had a bacterial infection and heart issues.  States she feels better now, however is depressed.    Pertinent History T1N1 Rt brease 3/7  nodes positive ; sleep insomina,  recent bacterial infection with 23# weight loss., Lt bundle branch blockage 40%    Limitations Reading   Currently in Pain? No/denies               LYMPHEDEMA/ONCOLOGY QUESTIONNAIRE - 09/21/16 1657      Lymphedema Assessments   Lymphedema Assessments Upper extremities     Right Upper Extremity Lymphedema   At Axilla  36 cm   15 cm Proximal to Olecranon Process 38.8 cm   10 cm Proximal to Olecranon Process 33 cm   Olecranon Process 29 cm   15 cm Proximal to Ulnar Styloid Process 29 cm   10 cm Proximal to Ulnar Styloid Process 27.4 cm   Just Proximal to Ulnar Styloid Process 19 cm   Across Hand at [employer] 19.4 cm   At [place] of 2nd Digit 6.5 cm   At [facility] of Thumb 7  cm     Left Upper Extremity Lymphedema   At Axilla  33.2 cm   15 cm Proximal to Olecranon Process 32.6 cm   10 cm Proximal to Olecranon Process 29.5 cm   Olecranon Process 25.2 cm   15 cm Proximal to Ulnar Styloid Process 23.6 cm   10 cm Proximal to Ulnar Styloid Process 20.3 cm   Just Proximal to Ulnar Styloid Process 16 cm   Across Hand at [employer] 19 cm   At [place] of 2nd Digit 6.8 cm   At [facility] of Thumb 7 cm                  [facility] Adult PT Treatment/Exercise - 09/21/16 0001      Manual Therapy   Manual Therapy Compression Bandaging   Manual therapy comments seperate from all other skilled care   Compression Bandaging to Rt UE using 1/4" foam, isoband, cotton and multilayer short stretch bandages. finger bandages also                  PT Short Term Goals - 08/24/16 1630      PT SHORT TERM GOAL #1   Title Pt ROM for flexion to be 170 or above to allow pt to reach into higher cabinets.    Time 2   Period Weeks   Status New     PT SHORT TERM GOAL #2   Title Pt to understand the benefit of using her compression pump everyday to reduce flair ups of lymphedema.   Time 2   Period Weeks   Status New     PT SHORT TERM GOAL #3   Title Be to begin self manual techniques to help to reduce lymphedema   Time 2   Period Weeks   Status New           PT Long Term Goals - 08/24/16 1632      PT LONG TERM GOAL #1   Title Pt to have decreased by 2 cm to allow pt to don shirts with ease.   Time 4   Period Weeks   Status New     PT LONG TERM GOAL #2   Title Pt to be able to don her compression garment and to verbalize the importance of wearing her garment daily    Time 4   Period Weeks   Status New               Plan - 09/21/16 1754    Clinical Impression Statement Rt UE  measured with noted increase from last session.  Pt has not been self bandaging, using her reidsleeve or her pump.   States she is eager to begin bandaging.  1/4'" foam cut  for Rt UE.  Moisturized and completed bandaging following measurement.  Finger bandages utilized as well.  Pt reported overall comfort at end of session.       Rehab Potential Good   PT Frequency 3x / week   PT Duration 4 weeks   PT Treatment/Interventions ADLs/Self Care Home Management;Manual techniques;Therapeutic exercise;Passive range of motion   PT Next Visit Plan Next session, assess how bandages worked and overall comfort with adjustments as needed.  Resume Manual lymph drainage.  Begin wand exercises for flexion, abduction and supine IR/ER please give sheets for HEP.   Consulted and Agree with Plan of Care Patient      Patient will benefit from skilled therapeutic intervention in order to improve the following deficits and impairments:  Decreased range of motion, Increased edema  Visit Diagnosis: Postmastectomy lymphedema  Stiffness of right shoulder, not elsewhere classified     Problem List Patient Active Problem List   Diagnosis Date Noted  . Sleep talking 07/22/2016  . Genetic testing 06/01/2016  . Family history of breast cancer   . Family history of rectal cancer   . Chest pain 09/23/2015  . Abnormal vision 06/13/2015  . Anxiety disorder 06/13/2015  . Cataract cortical, senile 06/13/2015  . Clinical depression 06/13/2015  . Cephalalgia 06/13/2015  . H/O malignant neoplasm of breast 06/13/2015  . 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 Sinus infection 06/13/2015  . Long term current use of systemic steroids 06/13/2015  . Chronic systolic CHF (congestive heart failure), NYHA class 1 ([place]) 05/09/2015  . LBBB (left bundle branch block) 03/26/2015  . Lymphedema of arm 12/13/2013  . Complete rupture of rotator cuff 11/22/2013  . Nipple discharge in female 12/01/2012  . History of breast cancer 11/30/2012  . Epiphora 11/28/2012  . Disorder of endocrine system 04/25/2012  . Ceratitis 04/25/2012  . Blepharitis 04/25/2012  . Breast cancer, right breast ([place])   . FATIGUE  10/01/2009  . 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 Dyspnea 10/01/2009  . HYPERLIPIDEMIA 09/30/2009    Teena Irani, PTA/CLT [phone]  09/21/2016, 6:00 PM  [facility] [place], [place], [address] Phone: [phone]   Fax:  [phone]  Name: Kylie Tucker MRN: [id number] Date of Birth: December 22, 1950

## 2016-09-23 ENCOUNTER — Ambulatory Visit (HOSPITAL_COMMUNITY): Payer: PPO | Admitting: Physical Therapy

## 2016-09-23 DIAGNOSIS — I972 Postmastectomy lymphedema syndrome: Secondary | ICD-10-CM | POA: Diagnosis not present

## 2016-09-23 DIAGNOSIS — M25611 Stiffness of right shoulder, not elsewhere classified: Secondary | ICD-10-CM

## 2016-09-23 NOTE — Therapy (Signed)
[place], [place], [address] Phone: [phone]   Fax:  [phone]  Physical Therapy Treatment  Patient Details  Name: Kylie Tucker MRN: [id number] Date of Birth: [birth year] Referring Provider: Alphonsa Overall  Encounter Date: 09/23/2016      PT End of Session - 09/23/16 1556    Visit Number 3   Number of Visits 12   Date for PT Re-Evaluation 09/23/16   Authorization Type [employer]   Authorization - Visit Number 3   Authorization - Number of Visits 10   PT Start Time 1300   PT Stop Time 1350   PT Time Calculation (min) 50 min   Activity Tolerance Patient tolerated treatment well   Behavior During Therapy [facility] for tasks assessed/performed      Past Medical History:  Diagnosis Date  . Allergy   . Anxiety   . Bilateral ovarian cysts    Do yearly ultrasound.  . Cancer ([place]) rt breast   2004/ chemo/ radiation  . Cellulitis and abscess of upper arm and forearm   . Family history of breast cancer   . Family history of rectal cancer   . GERD (gastroesophageal reflux disease)   . H/O Helicobacter infection 2017  . History of right mastectomy 2004   chemo/ radiation  . Hyperlipidemia   . Hypertension   . Insomnia   . Left bundle branch block 2016  . Migraines   . PONV (postoperative nausea and vomiting)    cannot take zofran  . Postmastectomy lymphedema rt side  . Sentinel node     Past Surgical History:  Procedure Laterality Date  . BREAST LUMPECTOMY  2004    br bx  . BREAST LUMPECTOMY WITH AXILLARY LYMPH NODE DISSECTION    . CARPAL TUNNEL RELEASE    . CATARACT EXTRACTION Left 07/12/2013  . MANDIBLE SURGERY  1991  . MOUTH SURGERY    . NASAL SINUS SURGERY  1990/2001  . NASAL SINUS SURGERY    . ORIF FINGER FRACTURE  02/14/2012   Procedure: OPEN REDUCTION INTERNAL FIXATION (ORIF) METACARPAL (FINGER) FRACTURE;  Surgeon: Wynonia Sours, MD;  Location: [facility];  Service:  Orthopedics;  Laterality: Right;  RIGHT FIFTH   . ORIF METACARPAL FRACTURE  8/13   rt   . PORT-A-CATH REMOVAL     in and now out  . REPAIR EXTENSOR TENDON Left 05/30/2013   Procedure: RELEASE TRANSPOSITION EXTENSOR POLLICUS LONGUS LEFT WRIST;  Surgeon: Wynonia Sours, MD;  Location: [facility];  Service: Orthopedics;  Laterality: Left;  . rt mastectomy  2004   lymph nodes-7-axillary node dissection  . SHOULDER ARTHROSCOPY WITH ROTATOR CUFF REPAIR AND SUBACROMIAL DECOMPRESSION Left 11/22/2013   Procedure: LEFT SHOULDER ARTHROSCOPY WITH SUBACROMIAL DECOMPRESSION DISTAL CLAVICLE RESECTION REPAIR  ROTATOR CUFF AS NEEDED ;  Surgeon: Cammie Sickle., MD;  Location: [facility];  Service: Orthopedics;  Laterality: Left;  . WRIST ARTHROSCOPY Left 2014    There were no vitals filed for this visit.      Subjective Assessment - 09/23/16 1554    Subjective Pt comes today with bandages intact on Rt UE, however states she had to remove the finger bandages. No pain or discomfort   Currently in Pain? No/denies               LYMPHEDEMA/ONCOLOGY QUESTIONNAIRE - 09/23/16 1311      Right Upper Extremity Lymphedema   At Axilla  35.5 cm   15 cm Proximal to Olecranon Process 34.5 cm   10 cm Proximal to Olecranon Process 31.6 cm   Olecranon Process 28.4 cm   15 cm Proximal to Ulnar Styloid Process 27 cm   10 cm Proximal to Ulnar Styloid Process 24 cm   Just Proximal to Ulnar Styloid Process 17.8 cm   Across Hand at [employer] 18.5 cm   At [place] of 2nd Digit 6.5 cm   At [facility] of Thumb 6.7 cm                  [facility] Adult PT Treatment/Exercise - 09/23/16 0001      Shoulder Exercises: Supine   Other Supine Exercises wand instruction for flexion, abduction and IR/ER     Manual Therapy   Manual Therapy Compression Bandaging   Manual therapy comments seperate from all other skilled care   Manual Lymphatic Drainage (MLD) Completed MLD to include  supraclavicular, deep and superfical abdominal; routing fluid using interaxillary and Rt axillary inguinal routes anteriorly only due to time restraints.    Compression Bandaging to Rt UE using 1/4" foam, isoband, cotton and multilayer short stretch bandages.                  PT Short Term Goals - 08/24/16 1630      PT SHORT TERM GOAL #1   Title Pt ROM for flexion to be 170 or above to allow pt to reach into higher cabinets.    Time 2   Period Weeks   Status New     PT SHORT TERM GOAL #2   Title Pt to understand the benefit of using her compression pump everyday to reduce flair ups of lymphedema.   Time 2   Period Weeks   Status New     PT SHORT TERM GOAL #3   Title Be to begin self manual techniques to help to reduce lymphedema   Time 2   Period Weeks   Status New           PT Long Term Goals - 08/24/16 1632      PT LONG TERM GOAL #1   Title Pt to have decreased by 2 cm to allow pt to don shirts with ease.   Time 4   Period Weeks   Status New     PT LONG TERM GOAL #2   Title Pt to be able to don her compression garment and to verbalize the importance of wearing her garment daily    Time 4   Period Weeks   Status New               Plan - 09/23/16 1556    Clinical Impression Statement Rt UE remeasured with marked improvement in swelling as compared to last session.  Instructed wtih wand exercises for Rt UE.  Pt completed, however reported she did not need these exericses as she had no deficits in her motion.  Completed manual to anterior only due to time restraints and rebandaged Rt UE.  Pt reported overall comfort.    Rehab Potential Good   PT Frequency 3x / week   PT Duration 4 weeks   PT Treatment/Interventions ADLs/Self Care Home Management;Manual techniques;Therapeutic exercise;Passive range of motion   PT Next Visit Plan Next session, complete Manual lymph drainage to posterior as well as anterior.  measure weekly for progress   PT Home  Exercise Plan 3/15: wand flexion, abduction and IR/ER, pt refused  need for handouts   Consulted and Agree with Plan of Care Patient      Patient will benefit from skilled therapeutic intervention in order to improve the following deficits and impairments:  Decreased range of motion, Increased edema  Visit Diagnosis: Postmastectomy lymphedema  Stiffness of right shoulder, not elsewhere classified     Problem List Patient Active Problem List   Diagnosis Date Noted  . Sleep talking 07/22/2016  . Genetic testing 06/01/2016  . Family history of breast cancer   . Family history of rectal cancer   . Chest pain 09/23/2015  . Abnormal vision 06/13/2015  . Anxiety disorder 06/13/2015  . Cataract cortical, senile 06/13/2015  . Clinical depression 06/13/2015  . Cephalalgia 06/13/2015  . H/O malignant neoplasm of breast 06/13/2015  . 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 Sinus infection 06/13/2015  . Long term current use of systemic steroids 06/13/2015  . Chronic systolic CHF (congestive heart failure), NYHA class 1 ([place]) 05/09/2015  . LBBB (left bundle branch block) 03/26/2015  . Lymphedema of arm 12/13/2013  . Complete rupture of rotator cuff 11/22/2013  . Nipple discharge in female 12/01/2012  . History of breast cancer 11/30/2012  . Epiphora 11/28/2012  . Disorder of endocrine system 04/25/2012  . Ceratitis 04/25/2012  . Blepharitis 04/25/2012  . Breast cancer, right breast ([place])   . FATIGUE 10/01/2009  . 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 Dyspnea 10/01/2009  . HYPERLIPIDEMIA 09/30/2009    Teena Irani, PTA/CLT [phone]  09/23/2016, 4:00 PM  [facility] [address] [place], [place], [address] Phone: [phone]   Fax:  [phone]  Name: Kylie Tucker MRN: [id number] Date of Birth: 29-May-1951

## 2016-09-27 DIAGNOSIS — Z1389 Encounter for screening for other disorder: Secondary | ICD-10-CM | POA: Diagnosis not present

## 2016-09-27 DIAGNOSIS — E663 Overweight: Secondary | ICD-10-CM | POA: Diagnosis not present

## 2016-09-27 DIAGNOSIS — K219 Gastro-esophageal reflux disease without esophagitis: Secondary | ICD-10-CM | POA: Diagnosis not present

## 2016-09-27 DIAGNOSIS — R632 Polyphagia: Secondary | ICD-10-CM | POA: Diagnosis not present

## 2016-09-27 DIAGNOSIS — E669 Obesity, unspecified: Secondary | ICD-10-CM | POA: Diagnosis not present

## 2016-09-27 DIAGNOSIS — Z6828 Body mass index (BMI) 28.0-28.9, adult: Secondary | ICD-10-CM | POA: Diagnosis not present

## 2016-09-27 DIAGNOSIS — E782 Mixed hyperlipidemia: Secondary | ICD-10-CM | POA: Diagnosis not present

## 2016-09-28 ENCOUNTER — Ambulatory Visit (HOSPITAL_COMMUNITY): Payer: PPO | Admitting: Physical Therapy

## 2016-09-28 DIAGNOSIS — I972 Postmastectomy lymphedema syndrome: Secondary | ICD-10-CM

## 2016-09-28 DIAGNOSIS — R632 Polyphagia: Secondary | ICD-10-CM | POA: Diagnosis not present

## 2016-09-28 DIAGNOSIS — Z1389 Encounter for screening for other disorder: Secondary | ICD-10-CM | POA: Diagnosis not present

## 2016-09-28 DIAGNOSIS — K219 Gastro-esophageal reflux disease without esophagitis: Secondary | ICD-10-CM | POA: Diagnosis not present

## 2016-09-28 DIAGNOSIS — E782 Mixed hyperlipidemia: Secondary | ICD-10-CM | POA: Diagnosis not present

## 2016-09-28 DIAGNOSIS — M25611 Stiffness of right shoulder, not elsewhere classified: Secondary | ICD-10-CM

## 2016-09-28 NOTE — Therapy (Signed)
[place], [place], [address] Phone: [phone]   Fax:  [phone]  Physical Therapy Treatment  Patient Details  Name: Kylie Tucker MRN: [id number] Date of Birth: [birth year] Referring Provider: Alphonsa Overall  Encounter Date: 09/28/2016      PT End of Session - 09/28/16 1753    Visit Number 4   Number of Visits 12   Date for PT Re-Evaluation 09/23/16   Authorization Type [employer]   Authorization - Visit Number 4   Authorization - Number of Visits 10   PT Start Time 1520   PT Stop Time 1608   PT Time Calculation (min) 48 min   Activity Tolerance Patient tolerated treatment well   Behavior During Therapy [facility] for tasks assessed/performed      Past Medical History:  Diagnosis Date  . Allergy   . Anxiety   . Bilateral ovarian cysts    Do yearly ultrasound.  . Cancer ([place]) rt breast   2004/ chemo/ radiation  . Cellulitis and abscess of upper arm and forearm   . Family history of breast cancer   . Family history of rectal cancer   . GERD (gastroesophageal reflux disease)   . H/O Helicobacter infection 2017  . History of right mastectomy 2004   chemo/ radiation  . Hyperlipidemia   . Hypertension   . Insomnia   . Left bundle branch block 2016  . Migraines   . PONV (postoperative nausea and vomiting)    cannot take zofran  . Postmastectomy lymphedema rt side  . Sentinel node     Past Surgical History:  Procedure Laterality Date  . BREAST LUMPECTOMY  2004    br bx  . BREAST LUMPECTOMY WITH AXILLARY LYMPH NODE DISSECTION    . CARPAL TUNNEL RELEASE    . CATARACT EXTRACTION Left 07/12/2013  . MANDIBLE SURGERY  1991  . MOUTH SURGERY    . NASAL SINUS SURGERY  1990/2001  . NASAL SINUS SURGERY    . ORIF FINGER FRACTURE  02/14/2012   Procedure: OPEN REDUCTION INTERNAL FIXATION (ORIF) METACARPAL (FINGER) FRACTURE;  Surgeon: Wynonia Sours, MD;  Location: [facility];  Service:  Orthopedics;  Laterality: Right;  RIGHT FIFTH   . ORIF METACARPAL FRACTURE  8/13   rt   . PORT-A-CATH REMOVAL     in and now out  . REPAIR EXTENSOR TENDON Left 05/30/2013   Procedure: RELEASE TRANSPOSITION EXTENSOR POLLICUS LONGUS LEFT WRIST;  Surgeon: Wynonia Sours, MD;  Location: [facility];  Service: Orthopedics;  Laterality: Left;  . rt mastectomy  2004   lymph nodes-7-axillary node dissection  . SHOULDER ARTHROSCOPY WITH ROTATOR CUFF REPAIR AND SUBACROMIAL DECOMPRESSION Left 11/22/2013   Procedure: LEFT SHOULDER ARTHROSCOPY WITH SUBACROMIAL DECOMPRESSION DISTAL CLAVICLE RESECTION REPAIR  ROTATOR CUFF AS NEEDED ;  Surgeon: Cammie Sickle., MD;  Location: [facility];  Service: Orthopedics;  Laterality: Left;  . WRIST ARTHROSCOPY Left 2014    There were no vitals filed for this visit.      Subjective Assessment - 09/28/16 1748    Subjective Pt comes today with her husband as he attempted to rebandage yesterday and it was uncomfortable.  Pt had phoned office and instructed to keep off until returning today.  Noted induration in distal Rt UE.  Pt reports no pain currently.     Currently in Pain? No/denies  [facility] Adult PT Treatment/Exercise - 09/28/16 0001      Manual Therapy   Manual Therapy Compression Bandaging   Manual therapy comments seperate from all other skilled care   Manual Lymphatic Drainage (MLD) Completed MLD to include supraclavicular, deep and superfical abdominal; routing fluid using interaxillary and Rt axillary inguinal routes anteriorly and posteriorly in sidelying    Compression Bandaging to Rt UE using 1/4" foam, isoband, cotton and multilayer short stretch bandages.                  PT Short Term Goals - 08/24/16 1630      PT SHORT TERM GOAL #1   Title Pt ROM for flexion to be 170 or above to allow pt to reach into higher cabinets.    Time 2   Period Weeks   Status New      PT SHORT TERM GOAL #2   Title Pt to understand the benefit of using her compression pump everyday to reduce flair ups of lymphedema.   Time 2   Period Weeks   Status New     PT SHORT TERM GOAL #3   Title Be to begin self manual techniques to help to reduce lymphedema   Time 2   Period Weeks   Status New           PT Long Term Goals - 08/24/16 1632      PT LONG TERM GOAL #1   Title Pt to have decreased by 2 cm to allow pt to don shirts with ease.   Time 4   Period Weeks   Status New     PT LONG TERM GOAL #2   Title Pt to be able to don her compression garment and to verbalize the importance of wearing her garment daily    Time 4   Period Weeks   Status New               Plan - 09/28/16 1753    Clinical Impression Statement Husband here for session today and educated on massage and bandaging.  Patient and spouse states they were trained without foam and only using 4 bandages.  Pt requested no finger bandages as they are uncomfortable and come off too easily . Discussed goals with patient regarding intentions on returning to compression garment.  Patient reports she has been unable to find a garment that was comfortable and has even gotten custiom garment that she does not wear.  Pt also has pump and reidsleeve that she does not use.  Anticipate once pateint/husband are comfortable with bandaging and volume down to comfortable level, pateint will be able to continue independently.  Spouse reported he thinks he would be able to bandage UE now.  Will need to observe him doing this to ensure proper technique.     Rehab Potential Good   PT Frequency 3x / week   PT Duration 4 weeks   PT Treatment/Interventions ADLs/Self Care Home Management;Manual techniques;Therapeutic exercise;Passive range of motion   PT Next Visit Plan Continue with Manual lymph drainage to posterior as well as anterior.  measure weekly for progress and continue to work on self bandaging.    PT Home  Exercise Plan 3/15: wand flexion, abduction and IR/ER, pt refused need for handouts   Consulted and Agree with Plan of Care Patient      Patient will benefit from skilled therapeutic intervention in order to improve the following deficits and impairments:  Decreased range of motion,  Increased edema  Visit Diagnosis: Postmastectomy lymphedema  Stiffness of right shoulder, not elsewhere classified     Problem List Patient Active Problem List   Diagnosis Date Noted  . Sleep talking 07/22/2016  . Genetic testing 06/01/2016  . Family history of breast cancer   . Family history of rectal cancer   . Chest pain 09/23/2015  . Abnormal vision 06/13/2015  . Anxiety disorder 06/13/2015  . Cataract cortical, senile 06/13/2015  . Clinical depression 06/13/2015  . Cephalalgia 06/13/2015  . H/O malignant neoplasm of breast 06/13/2015  . 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 Sinus infection 06/13/2015  . Long term current use of systemic steroids 06/13/2015  . Chronic systolic CHF (congestive heart failure), NYHA class 1 ([place]) 05/09/2015  . LBBB (left bundle branch block) 03/26/2015  . Lymphedema of arm 12/13/2013  . Complete rupture of rotator cuff 11/22/2013  . Nipple discharge in female 12/01/2012  . History of breast cancer 11/30/2012  . Epiphora 11/28/2012  . Disorder of endocrine system 04/25/2012  . Ceratitis 04/25/2012  . Blepharitis 04/25/2012  . Breast cancer, right breast ([place])   . FATIGUE 10/01/2009  . 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 Dyspnea 10/01/2009  . HYPERLIPIDEMIA 09/30/2009    Teena Irani, PTA/CLT [phone] 09/28/2016, 6:00 PM  [place], [place], [address] Phone: [phone]   Fax:  [phone]  Name: Kylie Tucker MRN: [id number] Date of Birth: [birth year]

## 2016-10-01 ENCOUNTER — Ambulatory Visit (HOSPITAL_COMMUNITY): Payer: PPO | Admitting: Physical Therapy

## 2016-10-01 DIAGNOSIS — M25611 Stiffness of right shoulder, not elsewhere classified: Secondary | ICD-10-CM

## 2016-10-01 DIAGNOSIS — I972 Postmastectomy lymphedema syndrome: Secondary | ICD-10-CM

## 2016-10-01 NOTE — Therapy (Signed)
[place], [place], [address] Phone: [phone]   Fax:  [phone]  Physical Therapy Treatment  Patient Details  Name: Kylie Tucker MRN: [id number] Date of Birth: [birth year] Referring Provider: Alphonsa Overall  Encounter Date: 10/01/2016      PT End of Session - 10/01/16 1540    Visit Number 5   Number of Visits 12   Date for PT Re-Evaluation 09/23/16   Authorization Type [employer]   Authorization - Visit Number 5   Authorization - Number of Visits 10   PT Start Time 2035   PT Stop Time 1524   PT Time Calculation (min) 51 min   Activity Tolerance Patient tolerated treatment well   Behavior During Therapy [facility] for tasks assessed/performed      Past Medical History:  Diagnosis Date  . Allergy   . Anxiety   . Bilateral ovarian cysts    Do yearly ultrasound.  . Cancer ([place]) rt breast   2004/ chemo/ radiation  . Cellulitis and abscess of upper arm and forearm   . Family history of breast cancer   . Family history of rectal cancer   . GERD (gastroesophageal reflux disease)   . H/O Helicobacter infection 2017  . History of right mastectomy 2004   chemo/ radiation  . Hyperlipidemia   . Hypertension   . Insomnia   . Left bundle branch block 2016  . Migraines   . PONV (postoperative nausea and vomiting)    cannot take zofran  . Postmastectomy lymphedema rt side  . Sentinel node     Past Surgical History:  Procedure Laterality Date  . BREAST LUMPECTOMY  2004    br bx  . BREAST LUMPECTOMY WITH AXILLARY LYMPH NODE DISSECTION    . CARPAL TUNNEL RELEASE    . CATARACT EXTRACTION Left 07/12/2013  . MANDIBLE SURGERY  1991  . MOUTH SURGERY    . NASAL SINUS SURGERY  1990/2001  . NASAL SINUS SURGERY    . ORIF FINGER FRACTURE  02/14/2012   Procedure: OPEN REDUCTION INTERNAL FIXATION (ORIF) METACARPAL (FINGER) FRACTURE;  Surgeon: Wynonia Sours, MD;  Location: [facility];  Service:  Orthopedics;  Laterality: Right;  RIGHT FIFTH   . ORIF METACARPAL FRACTURE  8/13   rt   . PORT-A-CATH REMOVAL     in and now out  . REPAIR EXTENSOR TENDON Left 05/30/2013   Procedure: RELEASE TRANSPOSITION EXTENSOR POLLICUS LONGUS LEFT WRIST;  Surgeon: Wynonia Sours, MD;  Location: [facility];  Service: Orthopedics;  Laterality: Left;  . rt mastectomy  2004   lymph nodes-7-axillary node dissection  . SHOULDER ARTHROSCOPY WITH ROTATOR CUFF REPAIR AND SUBACROMIAL DECOMPRESSION Left 11/22/2013   Procedure: LEFT SHOULDER ARTHROSCOPY WITH SUBACROMIAL DECOMPRESSION DISTAL CLAVICLE RESECTION REPAIR  ROTATOR CUFF AS NEEDED ;  Surgeon: Cammie Sickle., MD;  Location: [facility];  Service: Orthopedics;  Laterality: Left;  . WRIST ARTHROSCOPY Left 2014    There were no vitals filed for this visit.      Subjective Assessment - 10/01/16 1537    Subjective  Ms. Hannen states that she is most bothered by the swelling in her back.  Pt states that her husband has a blood clot and is not able to stay on his feet for very long.   Therapist explained to pt that husband could do manual and bandaging while sitting    Pertinent History T1N1 Rt brease 3/7  nodes positive ; sleep insomina,  recent bacterial infection with 23# weight loss., Lt bundle branch blockage 40%    Limitations Reading   Currently in Pain? No/denies               LYMPHEDEMA/ONCOLOGY QUESTIONNAIRE - 10/01/16 1507      Surgeries   Mastectomy Date 11/13/02   Number Lymph Nodes Removed 7     Date Lymphedema/Swelling Started   Date 10/14/03     Treatment   Past Chemotherapy Treatment Yes   Past Radiation Treatment Yes   Past Hormone Therapy Yes     What other symptoms do you have   Are you Having Heaviness or Tightness Yes   Are you having Pain No   Are you having pitting edema Yes   Is it Hard or Difficult finding clothes that fit Yes   Do you have infections No     Lymphedema Stage    Stage STAGE 3 ELEPHANTIASIS     Lymphedema Assessments   Lymphedema Assessments Upper extremities     Right Upper Extremity Lymphedema   At Axilla  39.9 cm  was 35.5   15 cm Proximal to Olecranon Process 39.5 cm  was 34.5   10 cm Proximal to Olecranon Process 36 cm  was 31.6   Olecranon Process 30.6 cm  was 28.4   15 cm Proximal to Ulnar Styloid Process 28.7 cm  was 27   10 cm Proximal to Ulnar Styloid Process 25 cm  was 24   Just Proximal to Ulnar Styloid Process 18.1 cm  was 17.8   Across Hand at [employer] 19.2 cm  was 18.5   At [place] of 2nd Digit 7 cm  was 6.5   At Base of Thumb 7 cm  was 6.5     Left Upper Extremity Lymphedema   At Axilla  33.2 cm   15 cm Proximal to Olecranon Process 32.6 cm   10 cm Proximal to Olecranon Process 29.5 cm   Olecranon Process 25.2 cm   15 cm Proximal to Ulnar Styloid Process 23.6 cm   10 cm Proximal to Ulnar Styloid Process 20.3 cm   Just Proximal to Ulnar Styloid Process 16 cm   Across Hand at [employer] 19 cm   At [place] of 2nd Digit 6.8 cm   At [facility] of Thumb 7 cm                  [facility] Adult PT Treatment/Exercise - 10/01/16 0001      Manual Therapy   Manual Therapy Manual Lymphatic Drainage (MLD);Compression Bandaging   Manual therapy comments seperate from all other skilled care   Manual Lymphatic Drainage (MLD) Completed MLD to include supraclavicular, deep and superfical abdominal; routing fluid using interaxillary and Rt axillary inguinal routes anteriorly and posteriorly in prone.   Compression Bandaging to Rt UE using 1/4" foam, isoband, cotton and multilayer short stretch bandages.                PT Education - 10/01/16 1539    Education provided Yes   Education Details Given self manual techniques for Rt UE lymphedema   Person(s) Educated Patient   Methods Handout   Comprehension Need further instruction          PT Short Term Goals - 10/01/16 1544      PT SHORT TERM GOAL #1    Title Pt ROM for flexion to be 170 or above to allow pt  to reach into higher cabinets.    Time 2   Period Weeks   Status Not Met     PT SHORT TERM GOAL #2   Title Pt to understand the benefit of using her compression pump everyday to reduce flair ups of lymphedema.   Time 2   Period Weeks   Status Not Met     PT SHORT TERM GOAL #3   Title Be to begin self manual techniques to help to reduce lymphedema   Time 2   Period Weeks   Status Not Met           PT Long Term Goals - 10/01/16 1545      PT LONG TERM GOAL #1   Title Pt to have decreased by 2 cm to allow pt to don shirts with ease.   Time 4   Period Weeks   Status Not Met     PT LONG TERM GOAL #2   Title Pt to be able to don her compression garment and to verbalize the importance of wearing her garment daily    Time 4   Period Weeks   Status Not Met               Plan - 10/01/16 1540    Clinical Impression Statement Pt remeasured.  She has increased edema in all aspects compared to last measurment.  Pt took her bandages off this morning to shower.  Therapist verbalized the benefit of using the pump and performing self manual techniques.  Pt requested husband to learn how to complete manual as well as bandaging.  Husband did not come back with pt this session.  Therapist gave pt self manual technique sheet and requested pt to read through it over the weekend and we will begin instructing in self manua techniques next visit.    Rehab Potential Good   PT Frequency 3x / week   PT Duration 4 weeks   PT Treatment/Interventions ADLs/Self Care Home Management;Manual techniques;Therapeutic exercise;Passive range of motion   PT Next Visit Plan Continue with Manual lymph drainage to posterior as well as anterior.  measure weekly for progress and continue to work on self manual and bandaging.  Check on pt ROM   PT Home Exercise Plan 3/15: wand flexion, abduction and IR/ER, pt refused need for handouts   Consulted and Agree  with Plan of Care Patient      Patient will benefit from skilled therapeutic intervention in order to improve the following deficits and impairments:  Decreased range of motion, Increased edema  Visit Diagnosis: Postmastectomy lymphedema  Stiffness of right shoulder, not elsewhere classified     Problem List Patient Active Problem List   Diagnosis Date Noted  . Sleep talking 07/22/2016  . Genetic testing 06/01/2016  . Family history of breast cancer   . Family history of rectal cancer   . Chest pain 09/23/2015  . Abnormal vision 06/13/2015  . Anxiety disorder 06/13/2015  . Cataract cortical, senile 06/13/2015  . Clinical depression 06/13/2015  . Cephalalgia 06/13/2015  . H/O malignant neoplasm of breast 06/13/2015  . 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 Sinus infection 06/13/2015  . Long term current use of systemic steroids 06/13/2015  . Chronic systolic CHF (congestive heart failure), NYHA class 1 ([place]) 05/09/2015  . LBBB (left bundle branch block) 03/26/2015  . Lymphedema of arm 12/13/2013  . Complete rupture of rotator cuff 11/22/2013  . Nipple discharge in female 12/01/2012  . History of breast cancer 11/30/2012  .  Epiphora 11/28/2012  . Disorder of endocrine system 04/25/2012  . Ceratitis 04/25/2012  . Blepharitis 04/25/2012  . Breast cancer, right breast ([place])   . FATIGUE 10/01/2009  . 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 Dyspnea 10/01/2009  . HYPERLIPIDEMIA 09/30/2009    Rayetta Humphrey, PT CLT [phone] 10/01/2016, 3:45 PM  [facility] [address] [place], [place], [address] Phone: [phone]   Fax:  [phone]  Name: Kylie Tucker MRN: [id number] Date of Birth: September 14, 1950

## 2016-10-04 ENCOUNTER — Encounter: Payer: Self-pay | Admitting: Internal Medicine

## 2016-10-04 ENCOUNTER — Ambulatory Visit (HOSPITAL_COMMUNITY): Payer: PPO | Admitting: Physical Therapy

## 2016-10-04 DIAGNOSIS — I972 Postmastectomy lymphedema syndrome: Secondary | ICD-10-CM

## 2016-10-04 DIAGNOSIS — M25611 Stiffness of right shoulder, not elsewhere classified: Secondary | ICD-10-CM

## 2016-10-04 NOTE — Therapy (Signed)
[place], [place], [address] Phone: [phone]   Fax:  [phone]  Physical Therapy Treatment  Patient Details  Name: Kylie Tucker MRN: [id number] Date of Birth: [birth year] Referring Provider: Alphonsa Overall  Encounter Date: 10/04/2016      PT End of Session - 10/04/16 1614    Visit Number 6   Number of Visits 12   Date for PT Re-Evaluation 09/23/16   Authorization Type [employer]   Authorization - Visit Number 6   Authorization - Number of Visits 10   PT Start Time 1520   PT Stop Time 1605   PT Time Calculation (min) 45 min   Activity Tolerance Patient tolerated treatment well   Behavior During Therapy [facility] for tasks assessed/performed      Past Medical History:  Diagnosis Date  . Allergy   . Anxiety   . Bilateral ovarian cysts    Do yearly ultrasound.  . Cancer ([place]) rt breast   2004/ chemo/ radiation  . Cellulitis and abscess of upper arm and forearm   . Family history of breast cancer   . Family history of rectal cancer   . GERD (gastroesophageal reflux disease)   . H/O Helicobacter infection 2017  . History of right mastectomy 2004   chemo/ radiation  . Hyperlipidemia   . Hypertension   . Insomnia   . Left bundle branch block 2016  . Migraines   . PONV (postoperative nausea and vomiting)    cannot take zofran  . Postmastectomy lymphedema rt side  . Sentinel node     Past Surgical History:  Procedure Laterality Date  . BREAST LUMPECTOMY  2004    br bx  . BREAST LUMPECTOMY WITH AXILLARY LYMPH NODE DISSECTION    . CARPAL TUNNEL RELEASE    . CATARACT EXTRACTION Left 07/12/2013  . MANDIBLE SURGERY  1991  . MOUTH SURGERY    . NASAL SINUS SURGERY  1990/2001  . NASAL SINUS SURGERY    . ORIF FINGER FRACTURE  02/14/2012   Procedure: OPEN REDUCTION INTERNAL FIXATION (ORIF) METACARPAL (FINGER) FRACTURE;  Surgeon: Wynonia Sours, MD;  Location: [place];  Service:  Orthopedics;  Laterality: Right;  RIGHT FIFTH   . ORIF METACARPAL FRACTURE  8/13   rt   . PORT-A-CATH REMOVAL     in and now out  . REPAIR EXTENSOR TENDON Left 05/30/2013   Procedure: RELEASE TRANSPOSITION EXTENSOR POLLICUS LONGUS LEFT WRIST;  Surgeon: Wynonia Sours, MD;  Location: [place];  Service: Orthopedics;  Laterality: Left;  . rt mastectomy  2004   lymph nodes-7-axillary node dissection  . SHOULDER ARTHROSCOPY WITH ROTATOR CUFF REPAIR AND SUBACROMIAL DECOMPRESSION Left 11/22/2013   Procedure: LEFT SHOULDER ARTHROSCOPY WITH SUBACROMIAL DECOMPRESSION DISTAL CLAVICLE RESECTION REPAIR  ROTATOR CUFF AS NEEDED ;  Surgeon: Cammie Sickle., MD;  Location: [place];  Service: Orthopedics;  Laterality: Left;  . WRIST ARTHROSCOPY Left 2014    There were no vitals filed for this visit.      Subjective Assessment - 10/04/16 1612    Subjective Pt and husband come together to treatment so that pt's husband can observe and learn how to complete manual as well as bandage Pt UE.    Pertinent History T1N1 Rt brease 3/7 nodes positive ; sleep insomina,  recent bacterial infection with 23# weight loss., Lt bundle branch blockage 40%    Limitations Reading   Currently in  Pain? No/denies                         [facility] Adult PT Treatment/Exercise - 10/04/16 0001      Manual Therapy   Manual Therapy Manual Lymphatic Drainage (MLD);Compression Bandaging   Manual therapy comments seperate from all other skilled care   Manual Lymphatic Drainage (MLD) Completed MLD to include supraclavicular, deep and superfical abdominal; routing fluid using interaxillary and Rt axillary inguinal routes anteriorly and posteriorly in prone.   Compression Bandaging to Rt UE using 1/4" foam, isoband, cotton and multilayer short stretch bandages.                PT Education - 10/04/16 1614    Education provided Yes   Education Details How to  perform manual  and bandaging of Rt UE   Person(s) Educated Patient;Spouse   Methods Explanation;Demonstration;Handout   Comprehension Verbalized understanding          PT Short Term Goals - 10/01/16 1544      PT SHORT TERM GOAL #1   Title Pt ROM for flexion to be 170 or above to allow pt to reach into higher cabinets.    Time 2   Period Weeks   Status Not Met     PT SHORT TERM GOAL #2   Title Pt to understand the benefit of using her compression pump everyday to reduce flair ups of lymphedema.   Time 2   Period Weeks   Status Not Met     PT SHORT TERM GOAL #3   Title Be to begin self manual techniques to help to reduce lymphedema   Time 2   Period Weeks   Status Not Met           PT Long Term Goals - 10/01/16 1545      PT LONG TERM GOAL #1   Title Pt to have decreased by 2 cm to allow pt to don shirts with ease.   Time 4   Period Weeks   Status Not Met     PT LONG TERM GOAL #2   Title Pt to be able to don her compression garment and to verbalize the importance of wearing her garment daily    Time 4   Period Weeks   Status Not Met               Plan - 10/04/16 1615    Clinical Impression Statement Pt not measured but has noted decreased volume. Husband instructed in proper technique for manual as well as bandaging.  Therapist gave husband hand outs on both.  Pt was encouraged to use pump as well if she takes her bandages off pt is hesitant to do this stating that it is difficult to get the chest piece on.     Rehab Potential Good   PT Frequency 3x / week   PT Duration 4 weeks   PT Treatment/Interventions ADLs/Self Care Home Management;Manual techniques;Therapeutic exercise;Passive range of motion   PT Next Visit Plan Answer any questions husband and pt may have on self massaging and bandaging technique.  Measure Friday for progress and continue to work on self manual and bandaging.    PT Home Exercise Plan 3/15: wand flexion, abduction and IR/ER, pt refused need for  handouts   Consulted and Agree with Plan of Care Patient      Patient will benefit from skilled therapeutic intervention in order to improve the following deficits  and impairments:  Decreased range of motion, Increased edema  Visit Diagnosis: Postmastectomy lymphedema  Stiffness of right shoulder, not elsewhere classified     Problem List Patient Active Problem List   Diagnosis Date Noted  . Sleep talking 07/22/2016  . Genetic testing 06/01/2016  . Family history of breast cancer   . Family history of rectal cancer   . Chest pain 09/23/2015  . Abnormal vision 06/13/2015  . Anxiety disorder 06/13/2015  . Cataract cortical, senile 06/13/2015  . Clinical depression 06/13/2015  . Cephalalgia 06/13/2015  . H/O malignant neoplasm of breast 06/13/2015  . 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 Sinus infection 06/13/2015  . Long term current use of systemic steroids 06/13/2015  . Chronic systolic CHF (congestive heart failure), NYHA class 1 ([place]) 05/09/2015  . LBBB (left bundle branch block) 03/26/2015  . Lymphedema of arm 12/13/2013  . Complete rupture of rotator cuff 11/22/2013  . Nipple discharge in female 12/01/2012  . History of breast cancer 11/30/2012  . Epiphora 11/28/2012  . Disorder of endocrine system 04/25/2012  . Ceratitis 04/25/2012  . Blepharitis 04/25/2012  . Breast cancer, right breast ([place])   . FATIGUE 10/01/2009  . 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 Dyspnea 10/01/2009  . HYPERLIPIDEMIA 09/30/2009    Rayetta Humphrey, PT CLT [phone] 10/04/2016, 4:18 PM  [facility] [address] [place], [place], [address] Phone: [phone]   Fax:  [phone]  Name: Kylie Tucker MRN: [id number] Date of Birth: June 15, 1951

## 2016-10-06 ENCOUNTER — Ambulatory Visit (HOSPITAL_COMMUNITY): Payer: PPO | Admitting: Physical Therapy

## 2016-10-06 DIAGNOSIS — M25611 Stiffness of right shoulder, not elsewhere classified: Secondary | ICD-10-CM

## 2016-10-06 DIAGNOSIS — I972 Postmastectomy lymphedema syndrome: Secondary | ICD-10-CM

## 2016-10-06 NOTE — Therapy (Signed)
[place], [place], [address] Phone: [phone]   Fax:  [phone]  Physical Therapy Treatment  Patient Details  Name: Kylie Tucker MRN: [id number] Date of Birth: May 11, 1951 Referring Provider: Alphonsa Overall  Encounter Date: 10/06/2016      PT End of Session - 10/06/16 1614    Visit Number 7   Number of Visits 12   Date for PT Re-Evaluation 09/23/16   Authorization Type [employer]   Authorization - Visit Number 6   Authorization - Number of Visits 10   PT Start Time 1524   PT Stop Time 1606   PT Time Calculation (min) 42 min   Activity Tolerance Patient tolerated treatment well   Behavior During Therapy [facility] for tasks assessed/performed      Past Medical History:  Diagnosis Date  . Allergy   . Anxiety   . Bilateral ovarian cysts    Do yearly ultrasound.  . Cancer ([place]) rt breast   2004/ chemo/ radiation  . Cellulitis and abscess of upper arm and forearm   . Family history of breast cancer   . Family history of rectal cancer   . GERD (gastroesophageal reflux disease)   . H/O Helicobacter infection 2017  . History of right mastectomy 2004   chemo/ radiation  . Hyperlipidemia   . Hypertension   . Insomnia   . Left bundle branch block 2016  . Migraines   . PONV (postoperative nausea and vomiting)    cannot take zofran  . Postmastectomy lymphedema rt side  . Sentinel node     Past Surgical History:  Procedure Laterality Date  . BREAST LUMPECTOMY  2004    br bx  . BREAST LUMPECTOMY WITH AXILLARY LYMPH NODE DISSECTION    . CARPAL TUNNEL RELEASE    . CATARACT EXTRACTION Left 07/12/2013  . MANDIBLE SURGERY  1991  . MOUTH SURGERY    . NASAL SINUS SURGERY  1990/2001  . NASAL SINUS SURGERY    . ORIF FINGER FRACTURE  02/14/2012   Procedure: OPEN REDUCTION INTERNAL FIXATION (ORIF) METACARPAL (FINGER) FRACTURE;  Surgeon: Wynonia Sours, MD;  Location: [place];  Service:  Orthopedics;  Laterality: Right;  RIGHT FIFTH   . ORIF METACARPAL FRACTURE  8/13   rt   . PORT-A-CATH REMOVAL     in and now out  . REPAIR EXTENSOR TENDON Left 05/30/2013   Procedure: RELEASE TRANSPOSITION EXTENSOR POLLICUS LONGUS LEFT WRIST;  Surgeon: Wynonia Sours, MD;  Location: [place];  Service: Orthopedics;  Laterality: Left;  . rt mastectomy  2004   lymph nodes-7-axillary node dissection  . SHOULDER ARTHROSCOPY WITH ROTATOR CUFF REPAIR AND SUBACROMIAL DECOMPRESSION Left 11/22/2013   Procedure: LEFT SHOULDER ARTHROSCOPY WITH SUBACROMIAL DECOMPRESSION DISTAL CLAVICLE RESECTION REPAIR  ROTATOR CUFF AS NEEDED ;  Surgeon: Cammie Sickle., MD;  Location: [place];  Service: Orthopedics;  Laterality: Left;  . WRIST ARTHROSCOPY Left 2014    There were no vitals filed for this visit.      Subjective Assessment - 10/06/16 1613    Subjective Pt states that she can finally tell that her arm is getting smaller.     Pertinent History T1N1 Rt brease 3/7 nodes positive ; sleep insomina,  recent bacterial infection with 23# weight loss., Lt bundle branch blockage 40%    Limitations Reading   Currently in Pain? No/denies  [facility] Adult PT Treatment/Exercise - 10/06/16 0001      Manual Therapy   Manual Therapy Manual Lymphatic Drainage (MLD);Compression Bandaging   Manual therapy comments seperate from all other skilled care   Manual Lymphatic Drainage (MLD) Completed MLD to include supraclavicular, deep and superfical abdominal; routing fluid using interaxillary and Rt axillary inguinal routes anteriorly and posteriorly in prone.   Compression Bandaging to Rt UE using 1/4" foam, isoband, cotton and multilayer short stretch bandages.                  PT Short Term Goals - 10/01/16 1544      PT SHORT TERM GOAL #1   Title Pt ROM for flexion to be 170 or above to allow pt to reach into higher cabinets.    Time 2   Period Weeks    Status Not Met     PT SHORT TERM GOAL #2   Title Pt to understand the benefit of using her compression pump everyday to reduce flair ups of lymphedema.   Time 2   Period Weeks   Status Not Met     PT SHORT TERM GOAL #3   Title Be to begin self manual techniques to help to reduce lymphedema   Time 2   Period Weeks   Status Not Met           PT Long Term Goals - 10/01/16 1545      PT LONG TERM GOAL #1   Title Pt to have decreased by 2 cm to allow pt to don shirts with ease.   Time 4   Period Weeks   Status Not Met     PT LONG TERM GOAL #2   Title Pt to be able to don her compression garment and to verbalize the importance of wearing her garment daily    Time 4   Period Weeks   Status Not Met               Plan - 10/06/16 1614    Clinical Impression Statement Noted decreased induration especially in hand and forearm.  Induration has decreased but is still noted in her uppper arm and subaxillary area.  Pt going out of town,(did not state this to end of session), and will not be her Friday to measure.    Rehab Potential Good   PT Frequency 3x / week   PT Duration 4 weeks   PT Treatment/Interventions ADLs/Self Care Home Management;Manual techniques;Therapeutic exercise;Passive range of motion   PT Next Visit Plan Remeasure next visit.     PT Home Exercise Plan 3/15: wand flexion, abduction and IR/ER, pt refused need for handouts   Consulted and Agree with Plan of Care Patient      Patient will benefit from skilled therapeutic intervention in order to improve the following deficits and impairments:  Decreased range of motion, Increased edema  Visit Diagnosis: Postmastectomy lymphedema  Stiffness of right shoulder, not elsewhere classified     Problem List Patient Active Problem List   Diagnosis Date Noted  . Sleep talking 07/22/2016  . Genetic testing 06/01/2016  . Family history of breast cancer   . Family history of rectal cancer   . Chest pain  09/23/2015  . Abnormal vision 06/13/2015  . Anxiety disorder 06/13/2015  . Cataract cortical, senile 06/13/2015  . Clinical depression 06/13/2015  . Cephalalgia 06/13/2015  . H/O malignant neoplasm of breast 06/13/2015  . 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 Sinus infection 06/13/2015  .  Long term current use of systemic steroids 06/13/2015  . Chronic systolic CHF (congestive heart failure), NYHA class 1 ([place]) 05/09/2015  . LBBB (left bundle branch block) 03/26/2015  . Lymphedema of arm 12/13/2013  . Complete rupture of rotator cuff 11/22/2013  . Nipple discharge in female 12/01/2012  . History of breast cancer 11/30/2012  . Epiphora 11/28/2012  . Disorder of endocrine system 04/25/2012  . Ceratitis 04/25/2012  . Blepharitis 04/25/2012  . Breast cancer, right breast ([place])   . FATIGUE 10/01/2009  . 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 Dyspnea 10/01/2009  . HYPERLIPIDEMIA 09/30/2009    Rayetta Humphrey, PT CLT [phone] 10/06/2016, 4:17 PM  [facility] [address] [place], [place], [address] Phone: [phone]   Fax:  [phone]  Name: Kylie Tucker MRN: [id number] Date of Birth: December 24, 1950

## 2016-10-08 ENCOUNTER — Telehealth (HOSPITAL_COMMUNITY): Payer: Self-pay | Admitting: Internal Medicine

## 2016-10-08 ENCOUNTER — Ambulatory Visit (HOSPITAL_COMMUNITY): Payer: PPO | Admitting: Physical Therapy

## 2016-10-08 NOTE — Telephone Encounter (Signed)
10/08/16 pt returned our call to say that she was out of town - didn't know she had an appt today

## 2016-10-11 ENCOUNTER — Ambulatory Visit (HOSPITAL_COMMUNITY): Payer: PPO | Attending: Surgery | Admitting: Physical Therapy

## 2016-10-11 DIAGNOSIS — M25611 Stiffness of right shoulder, not elsewhere classified: Secondary | ICD-10-CM

## 2016-10-11 DIAGNOSIS — I972 Postmastectomy lymphedema syndrome: Secondary | ICD-10-CM | POA: Diagnosis not present

## 2016-10-11 NOTE — Therapy (Signed)
[place], [place], [address] Phone: [phone]   Fax:  [phone]  Physical Therapy Treatment  Patient Details  Name: Kylie Tucker MRN: [id number] Date of Birth: 1951/03/07 Referring Provider: Alphonsa Overall  Encounter Date: 10/11/2016      PT End of Session - 10/11/16 1734    Visit Number 8   Number of Visits 12   Date for PT Re-Evaluation 09/23/16   Authorization Type [employer]   Authorization - Visit Number 8   Authorization - Number of Visits 10   PT Start Time 1525   PT Stop Time 1612   PT Time Calculation (min) 47 min   Activity Tolerance Patient tolerated treatment well   Behavior During Therapy [facility] for tasks assessed/performed      Past Medical History:  Diagnosis Date  . Allergy   . Anxiety   . Bilateral ovarian cysts    Do yearly ultrasound.  . Cancer ([place]) rt breast   2004/ chemo/ radiation  . Cellulitis and abscess of upper arm and forearm   . Family history of breast cancer   . Family history of rectal cancer   . GERD (gastroesophageal reflux disease)   . H/O Helicobacter infection 2017  . History of right mastectomy 2004   chemo/ radiation  . Hyperlipidemia   . Hypertension   . Insomnia   . Left bundle branch block 2016  . Migraines   . PONV (postoperative nausea and vomiting)    cannot take zofran  . Postmastectomy lymphedema rt side  . Sentinel node     Past Surgical History:  Procedure Laterality Date  . BREAST LUMPECTOMY  2004    br bx  . BREAST LUMPECTOMY WITH AXILLARY LYMPH NODE DISSECTION    . CARPAL TUNNEL RELEASE    . CATARACT EXTRACTION Left 07/12/2013  . MANDIBLE SURGERY  1991  . MOUTH SURGERY    . NASAL SINUS SURGERY  1990/2001  . NASAL SINUS SURGERY    . ORIF FINGER FRACTURE  02/14/2012   Procedure: OPEN REDUCTION INTERNAL FIXATION (ORIF) METACARPAL (FINGER) FRACTURE;  Surgeon: Wynonia Sours, MD;  Location: [place];  Service:  Orthopedics;  Laterality: Right;  RIGHT FIFTH   . ORIF METACARPAL FRACTURE  8/13   rt   . PORT-A-CATH REMOVAL     in and now out  . REPAIR EXTENSOR TENDON Left 05/30/2013   Procedure: RELEASE TRANSPOSITION EXTENSOR POLLICUS LONGUS LEFT WRIST;  Surgeon: Wynonia Sours, MD;  Location: [place];  Service: Orthopedics;  Laterality: Left;  . rt mastectomy  2004   lymph nodes-7-axillary node dissection  . SHOULDER ARTHROSCOPY WITH ROTATOR CUFF REPAIR AND SUBACROMIAL DECOMPRESSION Left 11/22/2013   Procedure: LEFT SHOULDER ARTHROSCOPY WITH SUBACROMIAL DECOMPRESSION DISTAL CLAVICLE RESECTION REPAIR  ROTATOR CUFF AS NEEDED ;  Surgeon: Cammie Sickle., MD;  Location: [place];  Service: Orthopedics;  Laterality: Left;  . WRIST ARTHROSCOPY Left 2014    There were no vitals filed for this visit.      Subjective Assessment - 10/11/16 1726    Subjective Pt states she can not tell a significant difference.  STates she went to the beach over the weekend and did not go outside until the sun went down.  STates her husband is wrapping her arm now.               LYMPHEDEMA/ONCOLOGY QUESTIONNAIRE - 10/11/16 1524  Right Upper Extremity Lymphedema   At Axilla  40 cm   15 cm Proximal to Olecranon Process 38 cm   10 cm Proximal to Olecranon Process 34.7 cm   Olecranon Process 30 cm   15 cm Proximal to Ulnar Styloid Process 28.8 cm   10 cm Proximal to Ulnar Styloid Process 25.3 cm   Just Proximal to Ulnar Styloid Process 17.5 cm   Across Hand at [employer] 18.6 cm   At [place] of 2nd Digit 6.7 cm   At [facility] of Thumb 7 cm                  [facility] Adult PT Treatment/Exercise - 10/11/16 0001      Manual Therapy   Manual Therapy Manual Lymphatic Drainage (MLD);Compression Bandaging   Manual therapy comments seperate from all other skilled care   Manual Lymphatic Drainage (MLD) Completed MLD to include supraclavicular, deep and superfical abdominal;  routing fluid using interaxillary and Rt axillary inguinal routes anteriorly and posteriorly in prone.   Compression Bandaging to Rt UE using 1/4" foam, isoband, cotton and multilayer short stretch bandages.                  PT Short Term Goals - 10/01/16 1544      PT SHORT TERM GOAL #1   Title Pt ROM for flexion to be 170 or above to allow pt to reach into higher cabinets.    Time 2   Period Weeks   Status Not Met     PT SHORT TERM GOAL #2   Title Pt to understand the benefit of using her compression pump everyday to reduce flair ups of lymphedema.   Time 2   Period Weeks   Status Not Met     PT SHORT TERM GOAL #3   Title Be to begin self manual techniques to help to reduce lymphedema   Time 2   Period Weeks   Status Not Met           PT Long Term Goals - 10/01/16 1545      PT LONG TERM GOAL #1   Title Pt to have decreased by 2 cm to allow pt to don shirts with ease.   Time 4   Period Weeks   Status Not Met     PT LONG TERM GOAL #2   Title Pt to be able to don her compression garment and to verbalize the importance of wearing her garment daily    Time 4   Period Weeks   Status Not Met               Plan - 10/11/16 1735    Clinical Impression Statement Rt without bandages upon arrival.  Pt reported she removed them prior to visit.  Remeasured this session with no signficant change in volume from initial evaluation with slight elevations and reductions in the past several weeks.  Also noted global increase in swelling with induration in stomach this session.  Encouraged patient to contact her MD regarding this.  To discuss currenty measurements with evaluating therapist next session.     Rehab Potential Good   PT Frequency 3x / week   PT Duration 4 weeks   PT Treatment/Interventions ADLs/Self Care Home Management;Manual techniques;Therapeutic exercise;Passive range of motion   PT Next Visit Plan Continue X 2 more session then reassess.    PT Home  Exercise Plan 3/15: wand flexion, abduction and IR/ER, pt refused need for  handouts   Consulted and Agree with Plan of Care Patient      Patient will benefit from skilled therapeutic intervention in order to improve the following deficits and impairments:  Decreased range of motion, Increased edema  Visit Diagnosis: Postmastectomy lymphedema  Stiffness of right shoulder, not elsewhere classified     Problem List Patient Active Problem List   Diagnosis Date Noted  . Sleep talking 07/22/2016  . Genetic testing 06/01/2016  . Family history of breast cancer   . Family history of rectal cancer   . Chest pain 09/23/2015  . Abnormal vision 06/13/2015  . Anxiety disorder 06/13/2015  . Cataract cortical, senile 06/13/2015  . Clinical depression 06/13/2015  . Cephalalgia 06/13/2015  . H/O malignant neoplasm of breast 06/13/2015  . 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 Sinus infection 06/13/2015  . Long term current use of systemic steroids 06/13/2015  . Chronic systolic CHF (congestive heart failure), NYHA class 1 ([place]) 05/09/2015  . LBBB (left bundle branch block) 03/26/2015  . Lymphedema of arm 12/13/2013  . Complete rupture of rotator cuff 11/22/2013  . Nipple discharge in female 12/01/2012  . History of breast cancer 11/30/2012  . Epiphora 11/28/2012  . Disorder of endocrine system 04/25/2012  . Ceratitis 04/25/2012  . Blepharitis 04/25/2012  . Breast cancer, right breast ([place])   . FATIGUE 10/01/2009  . 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 Dyspnea 10/01/2009  . HYPERLIPIDEMIA 09/30/2009    Teena Irani, PTA/CLT [phone]  10/11/2016, 5:51 PM  [facility] [address] [place], [place], [address] Phone: [phone]   Fax:  [phone]  Name: MONTGOMERY ROTHLISBERGER MRN: [id number] Date of Birth: [birth year]

## 2016-10-12 NOTE — Addendum Note (Signed)
Addended by: Leeroy Cha on: 10/12/2016 10:34 AM   Modules accepted: Orders

## 2016-10-13 ENCOUNTER — Ambulatory Visit (HOSPITAL_COMMUNITY): Payer: PPO | Admitting: Physical Therapy

## 2016-10-13 DIAGNOSIS — I972 Postmastectomy lymphedema syndrome: Secondary | ICD-10-CM

## 2016-10-13 DIAGNOSIS — M25611 Stiffness of right shoulder, not elsewhere classified: Secondary | ICD-10-CM | POA: Diagnosis not present

## 2016-10-13 NOTE — Therapy (Signed)
[place], [place], [address] Phone: [phone]   Fax:  [phone]  Physical Therapy Treatment  Patient Details  Name: Kylie Tucker MRN: [id number] Date of Birth: [birth year] Referring Provider: Alphonsa Overall  Encounter Date: 10/13/2016      PT End of Session - 10/13/16 1646    Visit Number 9   Number of Visits 12   Date for PT Re-Evaluation 09/23/16   Authorization Type [employer]   Authorization - Visit Number 9   Authorization - Number of Visits 10   PT Start Time 1524   PT Stop Time 5027   PT Time Calculation (min) 40 min   Activity Tolerance Patient tolerated treatment well   Behavior During Therapy [facility] for tasks assessed/performed      Past Medical History:  Diagnosis Date  . Allergy   . Anxiety   . Bilateral ovarian cysts    Do yearly ultrasound.  . Cancer ([place]) rt breast   2004/ chemo/ radiation  . Cellulitis and abscess of upper arm and forearm   . Family history of breast cancer   . Family history of rectal cancer   . GERD (gastroesophageal reflux disease)   . H/O Helicobacter infection 2017  . History of right mastectomy 2004   chemo/ radiation  . Hyperlipidemia   . Hypertension   . Insomnia   . Left bundle branch block 2016  . Migraines   . PONV (postoperative nausea and vomiting)    cannot take zofran  . Postmastectomy lymphedema rt side  . Sentinel node     Past Surgical History:  Procedure Laterality Date  . BREAST LUMPECTOMY  2004    br bx  . BREAST LUMPECTOMY WITH AXILLARY LYMPH NODE DISSECTION    . CARPAL TUNNEL RELEASE    . CATARACT EXTRACTION Left 07/12/2013  . MANDIBLE SURGERY  1991  . MOUTH SURGERY    . NASAL SINUS SURGERY  1990/2001  . NASAL SINUS SURGERY    . ORIF FINGER FRACTURE  02/14/2012   Procedure: OPEN REDUCTION INTERNAL FIXATION (ORIF) METACARPAL (FINGER) FRACTURE;  Surgeon: Wynonia Sours, MD;  Location: [place];  Service:  Orthopedics;  Laterality: Right;  RIGHT FIFTH   . ORIF METACARPAL FRACTURE  8/13   rt   . PORT-A-CATH REMOVAL     in and now out  . REPAIR EXTENSOR TENDON Left 05/30/2013   Procedure: RELEASE TRANSPOSITION EXTENSOR POLLICUS LONGUS LEFT WRIST;  Surgeon: Wynonia Sours, MD;  Location: [place];  Service: Orthopedics;  Laterality: Left;  . rt mastectomy  2004   lymph nodes-7-axillary node dissection  . SHOULDER ARTHROSCOPY WITH ROTATOR CUFF REPAIR AND SUBACROMIAL DECOMPRESSION Left 11/22/2013   Procedure: LEFT SHOULDER ARTHROSCOPY WITH SUBACROMIAL DECOMPRESSION DISTAL CLAVICLE RESECTION REPAIR  ROTATOR CUFF AS NEEDED ;  Surgeon: Cammie Sickle., MD;  Location: [place];  Service: Orthopedics;  Laterality: Left;  . WRIST ARTHROSCOPY Left 2014    There were no vitals filed for this visit.      Subjective Assessment - 10/13/16 1646    Subjective  Pt comes to department without her bandages on.  Pt states that her swelling seems to be about the same.    Pertinent History T1N1 Rt brease 3/7 nodes positive ; sleep insomina,  recent bacterial infection with 23# weight loss., Lt bundle branch blockage 40%    Limitations Reading   Currently in Pain? No/denies  [facility] Adult PT Treatment/Exercise - 10/13/16 0001      Manual Therapy   Manual Therapy Manual Lymphatic Drainage (MLD);Compression Bandaging   Manual therapy comments seperate from all other skilled care   Manual Lymphatic Drainage (MLD) Completed MLD to include supraclavicular, deep and superfical abdominal; routing fluid using interaxillary and Rt axillary inguinal routes anteriorly and posteriorly in prone.   Compression Bandaging to Rt UE using 1/4" foam, isoband, cotton and multilayer short stretch bandages.                  PT Short Term Goals - 10/01/16 1544      PT SHORT TERM GOAL #1   Title Pt ROM for flexion to be 170 or above to allow pt to  reach into higher cabinets.    Time 2   Period Weeks   Status Not Met     PT SHORT TERM GOAL #2   Title Pt to understand the benefit of using her compression pump everyday to reduce flair ups of lymphedema.   Time 2   Period Weeks   Status Not Met     PT SHORT TERM GOAL #3   Title Be to begin self manual techniques to help to reduce lymphedema   Time 2   Period Weeks   Status Not Met           PT Long Term Goals - 10/01/16 1545      PT LONG TERM GOAL #1   Title Pt to have decreased by 2 cm to allow pt to don shirts with ease.   Time 4   Period Weeks   Status Not Met     PT LONG TERM GOAL #2   Title Pt to be able to don her compression garment and to verbalize the importance of wearing her garment daily    Time 4   Period Weeks   Status Not Met               Plan - 10/13/16 1647    Clinical Impression Statement Pt does not have bandages on when she comes to department.  PT continues to have significant edema in her abdominal area.  Therapist urged pt to return to MD.  She states that she has an appontment with her medical MD and will inquire about her wholistic edema then.    Rehab Potential Good   PT Frequency 3x / week   PT Duration 4 weeks   PT Treatment/Interventions ADLs/Self Care Home Management;Manual techniques;Therapeutic exercise;Passive range of motion   PT Next Visit Plan remeasure next visits.     PT Home Exercise Plan 3/15: wand flexion, abduction and IR/ER, pt refused need for handouts   Consulted and Agree with Plan of Care Patient      Patient will benefit from skilled therapeutic intervention in order to improve the following deficits and impairments:  Decreased range of motion, Increased edema  Visit Diagnosis: Postmastectomy lymphedema  Stiffness of right shoulder, not elsewhere classified     Problem List Patient Active Problem List   Diagnosis Date Noted  . Sleep talking 07/22/2016  . Genetic testing 06/01/2016  . Family  history of breast cancer   . Family history of rectal cancer   . Chest pain 09/23/2015  . Abnormal vision 06/13/2015  . Anxiety disorder 06/13/2015  . Cataract cortical, senile 06/13/2015  . Clinical depression 06/13/2015  . Cephalalgia 06/13/2015  . H/O malignant neoplasm of breast 06/13/2015  . 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  Sinus infection 06/13/2015  . Long term current use of systemic steroids 06/13/2015  . Chronic systolic CHF (congestive heart failure), NYHA class 1 ([place]) 05/09/2015  . LBBB (left bundle branch block) 03/26/2015  . Lymphedema of arm 12/13/2013  . Complete rupture of rotator cuff 11/22/2013  . Nipple discharge in female 12/01/2012  . History of breast cancer 11/30/2012  . Epiphora 11/28/2012  . Disorder of endocrine system 04/25/2012  . Ceratitis 04/25/2012  . Blepharitis 04/25/2012  . Breast cancer, right breast ([place])   . FATIGUE 10/01/2009  . 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 Dyspnea 10/01/2009  . HYPERLIPIDEMIA 09/30/2009   Rayetta Humphrey, PT CLT [phone] 10/13/2016, 4:50 PM  [place] [address] [place], [place], [address] Phone: [phone]   Fax:  [phone]  Name: ZENIA GUEST MRN: [id number] Date of Birth: November 03, 1950

## 2016-10-14 ENCOUNTER — Ambulatory Visit: Payer: Commercial Managed Care - HMO | Admitting: Obstetrics and Gynecology

## 2016-10-14 DIAGNOSIS — I10 Essential (primary) hypertension: Secondary | ICD-10-CM | POA: Diagnosis not present

## 2016-10-14 DIAGNOSIS — R945 Abnormal results of liver function studies: Secondary | ICD-10-CM | POA: Diagnosis not present

## 2016-10-14 DIAGNOSIS — Z6830 Body mass index (BMI) 30.0-30.9, adult: Secondary | ICD-10-CM | POA: Diagnosis not present

## 2016-10-14 DIAGNOSIS — R140 Abdominal distension (gaseous): Secondary | ICD-10-CM | POA: Diagnosis not present

## 2016-10-14 DIAGNOSIS — E6609 Other obesity due to excess calories: Secondary | ICD-10-CM | POA: Diagnosis not present

## 2016-10-14 DIAGNOSIS — I5032 Chronic diastolic (congestive) heart failure: Secondary | ICD-10-CM | POA: Diagnosis not present

## 2016-10-14 DIAGNOSIS — R600 Localized edema: Secondary | ICD-10-CM | POA: Diagnosis not present

## 2016-10-14 DIAGNOSIS — K219 Gastro-esophageal reflux disease without esophagitis: Secondary | ICD-10-CM | POA: Diagnosis not present

## 2016-10-15 ENCOUNTER — Other Ambulatory Visit: Payer: Self-pay | Admitting: *Deleted

## 2016-10-15 ENCOUNTER — Other Ambulatory Visit (HOSPITAL_COMMUNITY): Payer: Self-pay | Admitting: Internal Medicine

## 2016-10-15 ENCOUNTER — Telehealth: Payer: Self-pay

## 2016-10-15 ENCOUNTER — Ambulatory Visit (HOSPITAL_COMMUNITY): Payer: PPO | Admitting: Physical Therapy

## 2016-10-15 DIAGNOSIS — I972 Postmastectomy lymphedema syndrome: Secondary | ICD-10-CM

## 2016-10-15 DIAGNOSIS — R740 Nonspecific elevation of levels of transaminase and lactic acid dehydrogenase [LDH]: Principal | ICD-10-CM

## 2016-10-15 DIAGNOSIS — C50911 Malignant neoplasm of unspecified site of right female breast: Secondary | ICD-10-CM

## 2016-10-15 DIAGNOSIS — Z170 Estrogen receptor positive status [ER+]: Principal | ICD-10-CM

## 2016-10-15 DIAGNOSIS — R7401 Elevation of levels of liver transaminase levels: Secondary | ICD-10-CM

## 2016-10-15 NOTE — Therapy (Signed)
[place], [place], [address] Phone: [phone]   Fax:  [phone]  Physical Therapy Treatment  Patient Details  Name: Kylie Tucker MRN: [id number] Date of Birth: Feb 25, 1951 Referring Provider: Alphonsa Overall  Encounter Date: 10/15/2016      PT End of Session - 10/15/16 1613    Visit Number 10   Number of Visits 12   Date for PT Re-Evaluation 09/23/16   Authorization Type [employer]   Authorization - Visit Number 10   Authorization - Number of Visits 12   PT Start Time 1520   PT Stop Time 1610   PT Time Calculation (min) 50 min   Activity Tolerance Patient tolerated treatment well   Behavior During Therapy [facility] for tasks assessed/performed      Past Medical History:  Diagnosis Date  . Allergy   . Anxiety   . Bilateral ovarian cysts    Do yearly ultrasound.  . Cancer ([place]) rt breast   2004/ chemo/ radiation  . Cellulitis and abscess of upper arm and forearm   . Family history of breast cancer   . Family history of rectal cancer   . GERD (gastroesophageal reflux disease)   . H/O Helicobacter infection 2017  . History of right mastectomy 2004   chemo/ radiation  . Hyperlipidemia   . Hypertension   . Insomnia   . Left bundle branch block 2016  . Migraines   . PONV (postoperative nausea and vomiting)    cannot take zofran  . Postmastectomy lymphedema rt side  . Sentinel node     Past Surgical History:  Procedure Laterality Date  . BREAST LUMPECTOMY  2004    br bx  . BREAST LUMPECTOMY WITH AXILLARY LYMPH NODE DISSECTION    . CARPAL TUNNEL RELEASE    . CATARACT EXTRACTION Left 07/12/2013  . MANDIBLE SURGERY  1991  . MOUTH SURGERY    . NASAL SINUS SURGERY  1990/2001  . NASAL SINUS SURGERY    . ORIF FINGER FRACTURE  02/14/2012   Procedure: OPEN REDUCTION INTERNAL FIXATION (ORIF) METACARPAL (FINGER) FRACTURE;  Surgeon: Wynonia Sours, MD;  Location: [place];  Service:  Orthopedics;  Laterality: Right;  RIGHT FIFTH   . ORIF METACARPAL FRACTURE  8/13   rt   . PORT-A-CATH REMOVAL     in and now out  . REPAIR EXTENSOR TENDON Left 05/30/2013   Procedure: RELEASE TRANSPOSITION EXTENSOR POLLICUS LONGUS LEFT WRIST;  Surgeon: Wynonia Sours, MD;  Location: [place];  Service: Orthopedics;  Laterality: Left;  . rt mastectomy  2004   lymph nodes-7-axillary node dissection  . SHOULDER ARTHROSCOPY WITH ROTATOR CUFF REPAIR AND SUBACROMIAL DECOMPRESSION Left 11/22/2013   Procedure: LEFT SHOULDER ARTHROSCOPY WITH SUBACROMIAL DECOMPRESSION DISTAL CLAVICLE RESECTION REPAIR  ROTATOR CUFF AS NEEDED ;  Surgeon: Cammie Sickle., MD;  Location: [place];  Service: Orthopedics;  Laterality: Left;  . WRIST ARTHROSCOPY Left 2014    There were no vitals filed for this visit.      Subjective Assessment - 10/15/16 1522    Subjective Pt went to the MD yesterday.  She is going for an ultrasound Tuesday for her liver and they are doing blood work.  She had a fluid pill added to her medication    Pertinent History T1N1 Rt brease 3/7 nodes positive ; sleep insomina,  recent bacterial infection with 23# weight loss., Lt bundle branch blockage 40%  Limitations Reading               LYMPHEDEMA/ONCOLOGY QUESTIONNAIRE - 10/15/16 1524      Right Upper Extremity Lymphedema   At Axilla  40.5 cm   15 cm Proximal to Olecranon Process 39.5 cm   10 cm Proximal to Olecranon Process 37.4 cm   Olecranon Process 30.7 cm   15 cm Proximal to Ulnar Styloid Process 29.2 cm   10 cm Proximal to Ulnar Styloid Process 24.5 cm   Just Proximal to Ulnar Styloid Process 18.4 cm   Across Hand at [employer] 20 cm   At [place] of 2nd Digit 7 cm   At [facility] of Thumb 7 cm      all volumes are increased since last measurement.             [facility] Adult PT Treatment/Exercise - 10/15/16 0001      Manual Therapy   Manual Therapy Manual Lymphatic Drainage  (MLD);Compression Bandaging   Manual therapy comments seperate from all other skilled care   Manual Lymphatic Drainage (MLD) Completed MLD to include supraclavicular, deep and superfical abdominal; routing fluid using interaxillary and Rt axillary inguinal routes anteriorly and posteriorly in prone.   Compression Bandaging to Rt UE using 1/4" foam, isoband, cotton and multilayer short stretch bandages.                  PT Short Term Goals - 10/01/16 1544      PT SHORT TERM GOAL #1   Title Pt ROM for flexion to be 170 or above to allow pt to reach into higher cabinets.    Time 2   Period Weeks   Status Not Met     PT SHORT TERM GOAL #2   Title Pt to understand the benefit of using her compression pump everyday to reduce flair ups of lymphedema.   Time 2   Period Weeks   Status Not Met     PT SHORT TERM GOAL #3   Title Be to begin self manual techniques to help to reduce lymphedema   Time 2   Period Weeks   Status Not Met           PT Long Term Goals - 10/01/16 1545      PT LONG TERM GOAL #1   Title Pt to have decreased by 2 cm to allow pt to don shirts with ease.   Time 4   Period Weeks   Status Not Met     PT LONG TERM GOAL #2   Title Pt to be able to don her compression garment and to verbalize the importance of wearing her garment daily    Time 4   Period Weeks   Status Not Met               Plan - 10/15/16 1614    Clinical Impression Statement Pt remeasured and measurments are higher.  Fluid appears to be increasing in pt trunk as well.  Encouraged pt to use her pump.  Pt wil be remeasured on Wednesday if no improvement we will discharge to home care.   PT having blood work up as welll as an ultrasound on her liver next week.    Rehab Potential Good   PT Frequency 3x / week   PT Duration 4 weeks   PT Treatment/Interventions ADLs/Self Care Home Management;Manual techniques;Therapeutic exercise;Passive range of motion   PT Next Visit Plan  Possible discharge next week  if pt has not improved in volumes.    PT Home Exercise Plan 3/15: wand flexion, abduction and IR/ER, pt refused need for handouts   Consulted and Agree with Plan of Care Patient      Patient will benefit from skilled therapeutic intervention in order to improve the following deficits and impairments:  Decreased range of motion, Increased edema  Visit Diagnosis: Postmastectomy lymphedema     Problem List Patient Active Problem List   Diagnosis Date Noted  . Sleep talking 07/22/2016  . Genetic testing 06/01/2016  . Family history of breast cancer   . Family history of rectal cancer   . Chest pain 09/23/2015  . Abnormal vision 06/13/2015  . Anxiety disorder 06/13/2015  . Cataract cortical, senile 06/13/2015  . Clinical depression 06/13/2015  . Cephalalgia 06/13/2015  . H/O malignant neoplasm of breast 06/13/2015  . 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 Sinus infection 06/13/2015  . Long term current use of systemic steroids 06/13/2015  . Chronic systolic CHF (congestive heart failure), NYHA class 1 ([place]) 05/09/2015  . LBBB (left bundle branch block) 03/26/2015  . Lymphedema of arm 12/13/2013  . Complete rupture of rotator cuff 11/22/2013  . Nipple discharge in female 12/01/2012  . History of breast cancer 11/30/2012  . Epiphora 11/28/2012  . Disorder of endocrine system 04/25/2012  . Ceratitis 04/25/2012  . Blepharitis 04/25/2012  . Breast cancer, right breast ([place])   . FATIGUE 10/01/2009  . 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 Dyspnea 10/01/2009  . HYPERLIPIDEMIA 09/30/2009    Rayetta Humphrey, PT CLT [phone] 10/15/2016, 4:18 PM  [facility] [address] [place], [place], [address] Phone: [phone]   Fax:  [phone]  Name: DELTHA BERNALES MRN: [id number] Date of Birth: 10-25-1950

## 2016-10-15 NOTE — Telephone Encounter (Signed)
Pt called to cancel Monday appt. She has another appt at that time. She will call to r/s. Lab and MD cancelled.

## 2016-10-18 ENCOUNTER — Ambulatory Visit (HOSPITAL_COMMUNITY): Payer: PPO | Admitting: Physical Therapy

## 2016-10-18 ENCOUNTER — Ambulatory Visit: Payer: Commercial Managed Care - HMO | Admitting: Oncology

## 2016-10-18 ENCOUNTER — Other Ambulatory Visit: Payer: Commercial Managed Care - HMO

## 2016-10-18 DIAGNOSIS — I972 Postmastectomy lymphedema syndrome: Secondary | ICD-10-CM

## 2016-10-18 DIAGNOSIS — M25611 Stiffness of right shoulder, not elsewhere classified: Secondary | ICD-10-CM

## 2016-10-18 NOTE — Therapy (Signed)
[place], [place], [address] Phone: [phone]   Fax:  [phone]  Physical Therapy Treatment  Patient Details  Name: Kylie Tucker MRN: [id number] Date of Birth: 1950-10-06 Referring Provider: Alphonsa Overall  Encounter Date: 10/18/2016      PT End of Session - 10/18/16 1612    Visit Number 11   Number of Visits 12   Date for PT Re-Evaluation 09/23/16   Authorization Type [employer]   Authorization - Visit Number 11   Authorization - Number of Visits 12   PT Start Time 1520   PT Stop Time 1608   PT Time Calculation (min) 48 min   Activity Tolerance Patient tolerated treatment well   Behavior During Therapy [facility] for tasks assessed/performed      Past Medical History:  Diagnosis Date  . Allergy   . Anxiety   . Bilateral ovarian cysts    Do yearly ultrasound.  . Cancer ([place]) rt breast   2004/ chemo/ radiation  . Cellulitis and abscess of upper arm and forearm   . Family history of breast cancer   . Family history of rectal cancer   . GERD (gastroesophageal reflux disease)   . H/O Helicobacter infection 2017  . History of right mastectomy 2004   chemo/ radiation  . Hyperlipidemia   . Hypertension   . Insomnia   . Left bundle branch block 2016  . Migraines   . PONV (postoperative nausea and vomiting)    cannot take zofran  . Postmastectomy lymphedema rt side  . Sentinel node     Past Surgical History:  Procedure Laterality Date  . BREAST LUMPECTOMY  2004    br bx  . BREAST LUMPECTOMY WITH AXILLARY LYMPH NODE DISSECTION    . CARPAL TUNNEL RELEASE    . CATARACT EXTRACTION Left 07/12/2013  . MANDIBLE SURGERY  1991  . MOUTH SURGERY    . NASAL SINUS SURGERY  1990/2001  . NASAL SINUS SURGERY    . ORIF FINGER FRACTURE  02/14/2012   Procedure: OPEN REDUCTION INTERNAL FIXATION (ORIF) METACARPAL (FINGER) FRACTURE;  Surgeon: Wynonia Sours, MD;  Location: [place];  Service:  Orthopedics;  Laterality: Right;  RIGHT FIFTH   . ORIF METACARPAL FRACTURE  8/13   rt   . PORT-A-CATH REMOVAL     in and now out  . REPAIR EXTENSOR TENDON Left 05/30/2013   Procedure: RELEASE TRANSPOSITION EXTENSOR POLLICUS LONGUS LEFT WRIST;  Surgeon: Wynonia Sours, MD;  Location: [place];  Service: Orthopedics;  Laterality: Left;  . rt mastectomy  2004   lymph nodes-7-axillary node dissection  . SHOULDER ARTHROSCOPY WITH ROTATOR CUFF REPAIR AND SUBACROMIAL DECOMPRESSION Left 11/22/2013   Procedure: LEFT SHOULDER ARTHROSCOPY WITH SUBACROMIAL DECOMPRESSION DISTAL CLAVICLE RESECTION REPAIR  ROTATOR CUFF AS NEEDED ;  Surgeon: Cammie Sickle., MD;  Location: [place];  Service: Orthopedics;  Laterality: Left;  . WRIST ARTHROSCOPY Left 2014    There were no vitals filed for this visit.      Subjective Assessment - 10/18/16 1611    Subjective Pt has not gotten her blood test results back.  She is going for her ultrasound tomorrow.    Pertinent History T1N1 Rt brease 3/7 nodes positive ; sleep insomina,  recent bacterial infection with 23# weight loss., Lt bundle branch blockage 40%    Limitations Reading   Currently in Pain? No/denies  [facility] Adult PT Treatment/Exercise - 10/18/16 0001      Manual Therapy   Manual Therapy Manual Lymphatic Drainage (MLD);Compression Bandaging   Manual therapy comments seperate from all other skilled care   Manual Lymphatic Drainage (MLD) Completed MLD to include supraclavicular, deep and superfical abdominal; routing fluid using interaxillary and Rt axillary inguinal routes anteriorly and posteriorly in prone.   Compression Bandaging to Rt UE using 1/4" foam, isoband, cotton and multilayer short stretch bandages.            PT Short Term Goals - 10/01/16 1544      PT SHORT TERM GOAL #1   Title Pt ROM for flexion to be 170 or above to allow pt to reach into higher cabinets.    Time 2    Period Weeks   Status Not Met     PT SHORT TERM GOAL #2   Title Pt to understand the benefit of using her compression pump everyday to reduce flair ups of lymphedema.   Time 2   Period Weeks   Status Not Met     PT SHORT TERM GOAL #3   Title Be to begin self manual techniques to help to reduce lymphedema   Time 2   Period Weeks   Status Not Met           PT Long Term Goals - 10/01/16 1545      PT LONG TERM GOAL #1   Title Pt to have decreased by 2 cm to allow pt to don shirts with ease.   Time 4   Period Weeks   Status Not Met     PT LONG TERM GOAL #2   Title Pt to be able to don her compression garment and to verbalize the importance of wearing her garment daily    Time 4   Period Weeks   Status Not Met               Plan - 10/18/16 1613    Clinical Impression Statement Pt comes into the department today with her bandages still on.  Noted decreased induration in upper arm when bandages were taken off.  Pt will be remeasured on Thursday.  If she has not decreased in volume therapist will recommend returning back to MD    Rehab Potential Good   PT Frequency 3x / week   PT Duration 4 weeks   PT Treatment/Interventions ADLs/Self Care Home Management;Manual techniques;Therapeutic exercise;Passive range of motion   PT Next Visit Plan Possible discharge if pt has not improved in volumes.    PT Home Exercise Plan 3/15: wand flexion, abduction and IR/ER, pt refused need for handouts   Consulted and Agree with Plan of Care Patient      Patient will benefit from skilled therapeutic intervention in order to improve the following deficits and impairments:  Decreased range of motion, Increased edema  Visit Diagnosis: Postmastectomy lymphedema  Stiffness of right shoulder, not elsewhere classified     Problem List Patient Active Problem List   Diagnosis Date Noted  . Sleep talking 07/22/2016  . Genetic testing 06/01/2016  . Family history of breast cancer   .  Family history of rectal cancer   . Chest pain 09/23/2015  . Abnormal vision 06/13/2015  . Anxiety disorder 06/13/2015  . Cataract cortical, senile 06/13/2015  . Clinical depression 06/13/2015  . Cephalalgia 06/13/2015  . H/O malignant neoplasm of breast 06/13/2015  . 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 Sinus infection 06/13/2015  .  Long term current use of systemic steroids 06/13/2015  . Chronic systolic CHF (congestive heart failure), NYHA class 1 ([place]) 05/09/2015  . LBBB (left bundle branch block) 03/26/2015  . Lymphedema of arm 12/13/2013  . Complete rupture of rotator cuff 11/22/2013  . Nipple discharge in female 12/01/2012  . History of breast cancer 11/30/2012  . Epiphora 11/28/2012  . Disorder of endocrine system 04/25/2012  . Ceratitis 04/25/2012  . Blepharitis 04/25/2012  . Breast cancer, right breast ([place])   . FATIGUE 10/01/2009  . 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 Dyspnea 10/01/2009  . HYPERLIPIDEMIA 09/30/2009  Rayetta Humphrey, PT CLT [phone] 10/18/2016, 4:15 PM  [facility] [address] [place], [place], [address] Phone: [phone]   Fax:  [phone]  Name: TANIESHA GLANZ MRN: [id number] Date of Birth: October 11, 1950

## 2016-10-19 ENCOUNTER — Emergency Department (HOSPITAL_COMMUNITY)
Admission: EM | Admit: 2016-10-19 | Discharge: 2016-10-20 | Disposition: A | Discharge status: Home / Self Care | Payer: PPO | Attending: Emergency Medicine | Admitting: Emergency Medicine

## 2016-10-19 ENCOUNTER — Encounter (HOSPITAL_COMMUNITY): Payer: Self-pay | Admitting: Emergency Medicine

## 2016-10-19 ENCOUNTER — Ambulatory Visit (HOSPITAL_COMMUNITY)
Admission: RE | Admit: 2016-10-19 | Discharge: 2016-10-19 | Disposition: A | Discharge status: Home / Self Care | Payer: PPO | Source: Ambulatory Visit | Attending: Internal Medicine | Admitting: Internal Medicine

## 2016-10-19 DIAGNOSIS — Z79899 Other long term (current) drug therapy: Secondary | ICD-10-CM | POA: Insufficient documentation

## 2016-10-19 DIAGNOSIS — R112 Nausea with vomiting, unspecified: Secondary | ICD-10-CM | POA: Insufficient documentation

## 2016-10-19 DIAGNOSIS — K7689 Other specified diseases of liver: Secondary | ICD-10-CM

## 2016-10-19 DIAGNOSIS — I110 Hypertensive heart disease with heart failure: Secondary | ICD-10-CM | POA: Insufficient documentation

## 2016-10-19 DIAGNOSIS — I10 Essential (primary) hypertension: Secondary | ICD-10-CM | POA: Diagnosis not present

## 2016-10-19 DIAGNOSIS — Z853 Personal history of malignant neoplasm of breast: Secondary | ICD-10-CM | POA: Diagnosis not present

## 2016-10-19 DIAGNOSIS — I5022 Chronic systolic (congestive) heart failure: Secondary | ICD-10-CM | POA: Insufficient documentation

## 2016-10-19 DIAGNOSIS — N281 Cyst of kidney, acquired: Secondary | ICD-10-CM | POA: Diagnosis not present

## 2016-10-19 DIAGNOSIS — R7401 Elevation of levels of liver transaminase levels: Secondary | ICD-10-CM

## 2016-10-19 DIAGNOSIS — R740 Nonspecific elevation of levels of transaminase and lactic acid dehydrogenase [LDH]: Principal | ICD-10-CM

## 2016-10-19 LAB — CBC
HEMATOCRIT: 42.8 % (ref 36.0–46.0)
HEMOGLOBIN: 14.7 g/dL (ref 12.0–15.0)
MCH: 30.6 pg (ref 26.0–34.0)
MCHC: 34.3 g/dL (ref 30.0–36.0)
MCV: 89 fL (ref 78.0–100.0)
Platelets: 300 10*3/uL (ref 150–400)
RBC: 4.81 MIL/uL (ref 3.87–5.11)
RDW: 13.8 % (ref 11.5–15.5)
WBC: 11.4 10*3/uL — ABNORMAL HIGH (ref 4.0–10.5)

## 2016-10-19 LAB — COMPREHENSIVE METABOLIC PANEL
ALK PHOS: 73 U/L (ref 38–126)
ALT: 27 U/L (ref 14–54)
ANION GAP: 9 (ref 5–15)
AST: 24 U/L (ref 15–41)
Albumin: 4 g/dL (ref 3.5–5.0)
BUN: 24 mg/dL — ABNORMAL HIGH (ref 6–20)
CHLORIDE: 95 mmol/L — AB (ref 101–111)
CO2: 24 mmol/L (ref 22–32)
Calcium: 9.3 mg/dL (ref 8.9–10.3)
Creatinine, Ser: 0.82 mg/dL (ref 0.44–1.00)
GFR calc non Af Amer: >60 mL/min (ref >60)
GLUCOSE: 134 mg/dL — AB (ref 65–99)
POTASSIUM: 4.6 mmol/L (ref 3.5–5.1)
Sodium: 128 mmol/L — ABNORMAL LOW (ref 135–145)
Total Bilirubin: 0.5 mg/dL (ref 0.3–1.2)
Total Protein: 7.4 g/dL (ref 6.5–8.1)

## 2016-10-19 LAB — LIPASE, BLOOD: Lipase: 37 U/L (ref 11–51)

## 2016-10-19 LAB — URINALYSIS, ROUTINE W REFLEX MICROSCOPIC
BILIRUBIN URINE: NEGATIVE
Glucose, UA: NEGATIVE mg/dL
Ketones, ur: NEGATIVE mg/dL
Leukocytes, UA: NEGATIVE
NITRITE: NEGATIVE
Protein, ur: NEGATIVE mg/dL
SPECIFIC GRAVITY, URINE: 1.016 (ref 1.005–1.030)
Squamous Epithelial / HPF: NONE SEEN
pH: 6 (ref 5.0–8.0)

## 2016-10-19 NOTE — ED Triage Notes (Signed)
Pt c/o nausea and vomiting since 1500. Pt denies any pain at this time.

## 2016-10-20 ENCOUNTER — Telehealth (HOSPITAL_COMMUNITY): Payer: Self-pay | Admitting: Internal Medicine

## 2016-10-20 ENCOUNTER — Ambulatory Visit (HOSPITAL_COMMUNITY): Payer: PPO | Admitting: Physical Therapy

## 2016-10-20 MED ORDER — PROMETHAZINE HCL 25 MG/ML IJ SOLN
25.0000 mg | Freq: Once | INTRAMUSCULAR | Status: AC
  Administered 2016-10-20: 25 mg via INTRAMUSCULAR
  Filled 2016-10-20: qty 1

## 2016-10-20 MED ORDER — SODIUM CHLORIDE 0.9 % IV BOLUS (SEPSIS)
1000.0000 mL | Freq: Once | INTRAVENOUS | Status: AC
  Administered 2016-10-20: 1000 mL via INTRAVENOUS

## 2016-10-20 MED ORDER — SODIUM CHLORIDE 0.9 % IV BOLUS (SEPSIS): 1000.0000 mL | Freq: Once | INTRAVENOUS | Status: DC

## 2016-10-20 MED ORDER — ALBUTEROL SULFATE (2.5 MG/3ML) 0.083% IN NEBU
2.5000 mg | INHALATION_SOLUTION | Freq: Once | RESPIRATORY_TRACT | Status: AC
  Administered 2016-10-20: 2.5 mg via RESPIRATORY_TRACT
  Filled 2016-10-20: qty 3

## 2016-10-20 MED ORDER — PROMETHAZINE HCL 25 MG RE SUPP: 25.0000 mg | Freq: Four times a day (QID) | RECTAL | 0 refills | Status: DC | PRN

## 2016-10-20 MED ORDER — IPRATROPIUM-ALBUTEROL 0.5-2.5 (3) MG/3ML IN SOLN
3.0000 mL | Freq: Once | RESPIRATORY_TRACT | Status: AC
  Administered 2016-10-20: 3 mL via RESPIRATORY_TRACT
  Filled 2016-10-20: qty 3

## 2016-10-20 NOTE — Telephone Encounter (Signed)
10/20/16 pt called to cx she said that she has a virus and just got out of the ED at [facility]

## 2016-10-20 NOTE — ED Notes (Signed)
93 mL on bladder scan

## 2016-10-20 NOTE — ED Provider Notes (Signed)
[facility] DEPT Provider Note   CSN: [id number] Arrival date & time: 10/19/16  2021    By signing my name below, I, Macon Large, attest that this documentation has been prepared under the direction and in the presence of Rolland Porter, MD. Electronically Signed: Macon Large, ED Scribe. 10/20/16. 12:35 AM.  Time Seen: 00:27  History   Chief Complaint Chief Complaint  Patient presents with  . Emesis   The history is provided by the patient and the spouse. No language interpreter was used.   HPI Comments: Kylie Tucker is a 66 y.o. female with PMHx of hyperlipidemia, GERD, HTN who presents to the Emergency Department complaining of moderate, constant, nausea accompanied by episodic vomiting onset earlier yesterday morning since 10 am on April 10. Pt notes having about ~12 episodes throughout the day. Pt states her having a bloating-sensation to her abdomen. She reports associated chills and generalized weakness s/p episodic vomiting. Pt also reports having a decrease in urine output. Per pt's spouse, she was placed on a fluid pill last week for leg swelling which has helped. No alleviating factors noted. She denies h/o of abdominal surgeries. Pt denies abdominal pain, diarrhea, fever, dizziness, light-headed. Pt is not a smoker or consumes alcohol.  No one else has been ill. She didn't eat anything that would make her ill.  PCP Dr. Redmond School   Past Medical History:  Diagnosis Date  . Allergy   . Anxiety   . Bilateral ovarian cysts    Do yearly ultrasound.  . Cancer ([place]) rt breast   2004/ chemo/ radiation  . Cellulitis and abscess of upper arm and forearm   . Family history of breast cancer   . Family history of rectal cancer   . GERD (gastroesophageal reflux disease)   . H/O Helicobacter infection 2017  . History of right mastectomy 2004   chemo/ radiation  . Hyperlipidemia   . Hypertension   . Insomnia   . Left bundle branch block 2016  . Migraines   .  PONV (postoperative nausea and vomiting)    cannot take zofran  . Postmastectomy lymphedema rt side  . Sentinel node     Patient Active Problem List   Diagnosis Date Noted  . Sleep talking 07/22/2016  . Genetic testing 06/01/2016  . Family history of breast cancer   . Family history of rectal cancer   . Chest pain 09/23/2015  . Abnormal vision 06/13/2015  . Anxiety disorder 06/13/2015  . Cataract cortical, senile 06/13/2015  . Clinical depression 06/13/2015  . Cephalalgia 06/13/2015  . H/O malignant neoplasm of breast 06/13/2015  . 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 Sinus infection 06/13/2015  . Long term current use of systemic steroids 06/13/2015  . Chronic systolic CHF (congestive heart failure), NYHA class 1 ([place]) 05/09/2015  . LBBB (left bundle branch block) 03/26/2015  . Lymphedema of arm 12/13/2013  . Complete rupture of rotator cuff 11/22/2013  . Nipple discharge in female 12/01/2012  . History of breast cancer 11/30/2012  . Epiphora 11/28/2012  . Disorder of endocrine system 04/25/2012  . Ceratitis 04/25/2012  . Blepharitis 04/25/2012  . Breast cancer, right breast ([place])   . FATIGUE 10/01/2009  . 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 Dyspnea 10/01/2009  . HYPERLIPIDEMIA 09/30/2009    Past Surgical History:  Procedure Laterality Date  . BREAST LUMPECTOMY  2004    br bx  . BREAST LUMPECTOMY WITH AXILLARY LYMPH NODE DISSECTION    . CARPAL TUNNEL RELEASE    .  CATARACT EXTRACTION Left 07/12/2013  . MANDIBLE SURGERY  1991  . MOUTH SURGERY    . NASAL SINUS SURGERY  1990/2001  . NASAL SINUS SURGERY    . ORIF FINGER FRACTURE  02/14/2012   Procedure: OPEN REDUCTION INTERNAL FIXATION (ORIF) METACARPAL (FINGER) FRACTURE;  Surgeon: Wynonia Sours, MD;  Location: [facility];  Service: Orthopedics;  Laterality: Right;  RIGHT FIFTH   . ORIF METACARPAL FRACTURE  8/13   rt   . PORT-A-CATH REMOVAL     in and now out  . REPAIR EXTENSOR TENDON Left 05/30/2013   Procedure:  RELEASE TRANSPOSITION EXTENSOR POLLICUS LONGUS LEFT WRIST;  Surgeon: Wynonia Sours, MD;  Location: [facility];  Service: Orthopedics;  Laterality: Left;  . rt mastectomy  2004   lymph nodes-7-axillary node dissection  . SHOULDER ARTHROSCOPY WITH ROTATOR CUFF REPAIR AND SUBACROMIAL DECOMPRESSION Left 11/22/2013   Procedure: LEFT SHOULDER ARTHROSCOPY WITH SUBACROMIAL DECOMPRESSION DISTAL CLAVICLE RESECTION REPAIR  ROTATOR CUFF AS NEEDED ;  Surgeon: Cammie Sickle., MD;  Location: [facility];  Service: Orthopedics;  Laterality: Left;  . WRIST ARTHROSCOPY Left 2014    OB History    Gravida Para Term Preterm AB Living   3 2     1 2    SAB TAB Ectopic Multiple Live Births   0 1             Home Medications    Prior to Admission medications   Medication Sig Start Date End Date Taking? Authorizing Provider  ALPRAZOLAM XR 3 MG 24 hr tablet Take 3 mg by mouth at bedtime. 06/30/16   Historical Provider, MD  Cholecalciferol (VITAMIN D) 2000 UNITS CAPS Take 1 capsule by mouth daily.     Historical Provider, MD  DOCOSAHEXAENOIC ACID PO Take 1 capsule by mouth daily.    Historical Provider, MD  doxepin (SINEQUAN) 10 MG capsule Take 10 mg by mouth at bedtime.  09/16/16   Historical Provider, MD  eletriptan (RELPAX) 40 MG tablet Take 40 mg by mouth as needed for migraine or headache. Reported on 10/20/2015    Historical Provider, MD  eszopiclone 3 MG TABS Take 1 tablet (3 mg total) by mouth at bedtime as needed for sleep. Take immediately before bedtime 08/26/16   Britt Bottom, MD  metoprolol succinate (TOPROL-XL) 50 MG 24 hr tablet Take 1 tablet by mouth daily. 08/23/16   Historical Provider, MD  Multiple Vitamins-Minerals (MULTIVITAMINS THER. W/MINERALS) TABS Take 1 tablet by mouth daily.      Historical Provider, MD  nebivolol (BYSTOLIC) 5 MG tablet Take 1 tablet (5 mg total) by mouth daily. Patient not taking: Reported on 07/22/2016 01/22/16   Thayer Headings, MD    Probiotic Product (PROBIOTIC DAILY PO) Take 1 tablet by mouth daily.     Historical Provider, MD  promethazine (PHENERGAN) 25 MG suppository Place 1 suppository (25 mg total) rectally every 6 (six) hours as needed for nausea or vomiting. 10/20/16   Rolland Porter, MD  QUEtiapine (SEROQUEL) 25 MG tablet Take 1 tablet (25 mg total) by mouth at bedtime. Patient not taking: Reported on 09/15/2016 08/19/16   Larey Seat, MD    Family History Family History  Problem Relation Age of Onset  . Heart disease Father   . Heart disease Mother   . Hypertension Mother   . Rectal cancer Mother     dx in her early 70s  . Colon polyps Mother   . Breast  cancer Maternal Aunt     dx in her early 88s  . Heart disease Paternal Uncle   . Heart disease Paternal Grandmother   . Heart disease Paternal Grandfather   . Diabetes Brother   . Breast cancer Maternal Grandmother     dx in her 51s  . Breast cancer Cousin     dx in early 83s  . Breast cancer Cousin 32  . Hepatitis C Son   . Hypertension Son   . Post-traumatic stress disorder Son     Social History Social History  Substance Use Topics  . Smoking status: Never Smoker  . Smokeless tobacco: Never Used  . Alcohol use No  retired   Allergies   Acetaminophen; Amoxicillin-pot clavulanate; Azithromycin; Cefuroxime axetil; Cephalexin; Clonazepam; Hydrocodone-acetaminophen; Hydrocodone-acetaminophen; Levofloxacin; Morphine and related; Ondansetron; Propoxyphene n-acetaminophen; Rosuvastatin; Sulfonamide derivatives; Tramadol; Trazodone and nefazodone; Vioxx [rofecoxib]; Zofran [ondansetron hcl]; Clarithromycin; Codeine; Seroquel [quetiapine fumarate]; and Sulfamethoxazole   Review of Systems Review of Systems  Constitutional: Negative for fever.  Gastrointestinal: Positive for nausea and vomiting. Negative for abdominal pain and diarrhea.  Neurological: Negative for dizziness and light-headedness.  All other systems reviewed and are  negative.    Physical Exam Updated Vital Signs BP (!) 208/88 (BP Location: Left Wrist)   Pulse 78   Temp 97.5 F (36.4 C)   Resp 20   Ht 5\' 2"  (1.575 m)   Wt 140 lb (63.5 kg)   LMP 01/10/2003   SpO2 94%   BMI 25.61 kg/m   Vital signs normal except for hypertension   Physical Exam  Constitutional: She is oriented to person, place, and time. She appears well-developed and well-nourished.  Non-toxic appearance. She does not appear ill. No distress.  HENT:  Head: Normocephalic and atraumatic.  Right Ear: External ear normal.  Left Ear: External ear normal.  Nose: Nose normal. No mucosal edema or rhinorrhea.  Mouth/Throat: Oropharynx is clear and moist. Mucous membranes are dry. No dental abscesses or uvula swelling.  Eyes: Conjunctivae and EOM are normal. Pupils are equal, round, and reactive to light.  Neck: Normal range of motion and full passive range of motion without pain. Neck supple.  Cardiovascular: Normal rate, regular rhythm and normal heart sounds.  Exam reveals no gallop and no friction rub.   No murmur heard. Pulmonary/Chest: Effort normal and breath sounds normal. No respiratory distress. She has no wheezes. She has no rhonchi. She has no rales. She exhibits no tenderness and no crepitus.  Abdominal: Soft. Normal appearance and bowel sounds are normal. She exhibits no distension. There is no tenderness. There is no rebound and no guarding.  Musculoskeletal: Normal range of motion. She exhibits no edema or tenderness.  Moves all extremities well.   Neurological: She is alert and oriented to person, place, and time. She has normal strength. No cranial nerve deficit.  Skin: Skin is warm, dry and intact. No rash noted. No erythema. No pallor.  Psychiatric: She has a normal mood and affect. Her speech is normal and behavior is normal. Her mood appears not anxious.  Nursing note and vitals reviewed.    ED Treatments / Results   DIAGNOSTIC STUDIES: Oxygen Saturation  is 94% on RA, adequate by my interpretation.    Labs (all labs ordered are listed, but only abnormal results are displayed) Results for orders placed or performed during the hospital encounter of 10/19/16  Lipase, blood  Result Value Ref Range   Lipase 37 11 - 51 U/L  Comprehensive metabolic panel  Result Value Ref Range   Sodium 128 (L) 135 - 145 mmol/L   Potassium 4.6 3.5 - 5.1 mmol/L   Chloride 95 (L) 101 - 111 mmol/L   CO2 24 22 - 32 mmol/L   Glucose, Bld 134 (H) 65 - 99 mg/dL   BUN 24 (H) 6 - 20 mg/dL   Creatinine, Ser 0.82 0.44 - 1.00 mg/dL   Calcium 9.3 8.9 - 10.3 mg/dL   Total Protein 7.4 6.5 - 8.1 g/dL   Albumin 4.0 3.5 - 5.0 g/dL   AST 24 15 - 41 U/L   ALT 27 14 - 54 U/L   Alkaline Phosphatase 73 38 - 126 U/L   Total Bilirubin 0.5 0.3 - 1.2 mg/dL   GFR calc non Af Amer >60 >60 mL/min   GFR calc Af Amer >60 >60 mL/min   Anion gap 9 5 - 15  CBC  Result Value Ref Range   WBC 11.4 (H) 4.0 - 10.5 K/uL   RBC 4.81 3.87 - 5.11 MIL/uL   Hemoglobin 14.7 12.0 - 15.0 g/dL   HCT 42.8 36.0 - 46.0 %   MCV 89.0 78.0 - 100.0 fL   MCH 30.6 26.0 - 34.0 pg   MCHC 34.3 30.0 - 36.0 g/dL   RDW 13.8 11.5 - 15.5 %   Platelets 300 150 - 400 K/uL  Urinalysis, Routine w reflex microscopic  Result Value Ref Range   Color, Urine YELLOW YELLOW   APPearance CLEAR CLEAR   Specific Gravity, Urine 1.016 1.005 - 1.030   pH 6.0 5.0 - 8.0   Glucose, UA NEGATIVE NEGATIVE mg/dL   Hgb urine dipstick SMALL (A) NEGATIVE   Bilirubin Urine NEGATIVE NEGATIVE   Ketones, ur NEGATIVE NEGATIVE mg/dL   Protein, ur NEGATIVE NEGATIVE mg/dL   Nitrite NEGATIVE NEGATIVE   Leukocytes, UA NEGATIVE NEGATIVE   RBC / HPF 0-5 0 - 5 RBC/hpf   WBC, UA 0-5 0 - 5 WBC/hpf   Bacteria, UA RARE (A) NONE SEEN   Squamous Epithelial / LPF NONE SEEN NONE SEEN    Laboratory interpretation all normal except mild hyponatremia c/w dehydration, leukocytosis    EKG  EKG Interpretation None       Radiology US Abdomen  Complete  Result Date: 10/19/2016 CLINICAL DATA:  Elevated transaminase, hypertension, kidney stones EXAM: ABDOMEN ULTRASOUND COMPLETE COMPARISON:  CT abdomen and pelvis of 12/19/2015 FINDINGS: Gallbladder: Gallbladder is well visualized and no gallstones are noted. There is no pain over the gallbladder with compression. Common bile duct: Diameter: The common bile duct measures 4.4 mm in diameter. Liver: The liver is diffusely echogenic consistent with fatty infiltration. No focal hepatic abnormality is seen other than previously identified liver cysts of 2.2 and 1.7 cm in diameter. IVC: No abnormality visualized. Pancreas: The head and body the pancreas is unremarkable. The tail the pancreas is partially obscured by bowel gas. Spleen: The spleen measures 5.5 cm. Right Kidney: Length: 9.9 cm.  No hydronephrosis is seen. Left Kidney: Length: 9.5 cm.  No hydronephrosis is noted. Abdominal aorta: The abdominal aorta is normal in caliber. Other findings: None. IMPRESSION: 1. Echogenic liver parenchyma diffusely consistent with fatty infiltration. No focal hepatic abnormality. 2. No gallstones. 3. No change in previously identified hepatic cysts. Electronically Signed   By: Ivar Drape M.D.   On: 10/19/2016 10:15    Procedures Procedures (including critical care time)  Medications Ordered in ED Medications  sodium chloride 0.9 % bolus 1,000 mL (1,000 mLs Intravenous Refused 10/20/16 0419)  sodium chloride 0.9 % bolus 1,000 mL (0 mLs Intravenous Stopped 10/20/16 0419)  sodium chloride 0.9 % bolus 1,000 mL (0 mLs Intravenous Stopped 10/20/16 0206)  promethazine (PHENERGAN) injection 25 mg (25 mg Intramuscular Given 10/20/16 0040)  ipratropium-albuterol (DUONEB) 0.5-2.5 (3) MG/3ML nebulizer solution 3 mL (3 mLs Nebulization Given 10/20/16 0434)  albuterol (PROVENTIL) (2.5 MG/3ML) 0.083% nebulizer solution 2.5 mg (2.5 mg Nebulization Given 10/20/16 0434)  promethazine (PHENERGAN) injection 25 mg (25 mg  Intramuscular Given 10/20/16 0549)     Initial Impression / Assessment and Plan / ED Course  I have reviewed the triage vital signs and the nursing notes.  Pertinent labs & imaging results that were available during my care of the patient were reviewed by me and considered in my medical decision making (see chart for details).  COORDINATION OF CARE: 12:34 AM Discussed treatment plan with pt at bedside which includes labs and pt agreed to plan.  Patient was given IV fluids and IM Phenergan for her complaint of nausea and vomiting. She is allergic to multiple medications. After her first liter of IV fluids she felt like she was having urinary retention. Bladder scan was done and had less than 100 mL urine. She received her second liter. When I examined her after her second liter she states her tongue still feels dry and she has not had a urinary output. I ordered a third liter. She was able to void after the third liter. However she started having some shortness of breath and the nurse reports wheezing. She was given albuterol nebulizer. When I rechecked her at about 6 AM her lungs were clear and she was feeling better other than feeling ill shaky and nervous. We discussed that she most likely has a stomach virus that has been going around, there were multiple patients with similar symptoms in the ED last night. She was discharged home with Phenergan suppositories.   Final Clinical Impressions(s) / ED Diagnoses   Final diagnoses:  Non-intractable vomiting with nausea, unspecified vomiting type    New Prescriptions Discharge Medication List as of 10/20/2016  6:25 AM    START taking these medications   Details  promethazine (PHENERGAN) 25 MG suppository Place 1 suppository (25 mg total) rectally every 6 (six) hours as needed for nausea or vomiting., Starting Wed 10/20/2016, Print        Plan discharge  Rolland Porter, MD, FACEP  I personally performed the services described in this  documentation, which was scribed in my presence. The recorded information has been reviewed and considered.  Rolland Porter, MD, Barbette Or, MD 10/20/16 [phone]

## 2016-10-20 NOTE — Discharge Instructions (Signed)
Drink plenty of fluids (clear liquids) then start a bland diet later this morning such as toast, crackers, jello, [employer] chicken noodle soup. Use the zofran for nausea or vomiting. Take imodium OTC for diarrhea if you have it.  Avoid milk products until the diarrhea is gone.  Recheck if you get dehydrated again.

## 2016-10-25 ENCOUNTER — Ambulatory Visit (HOSPITAL_COMMUNITY): Payer: PPO | Admitting: Physical Therapy

## 2016-10-25 DIAGNOSIS — I972 Postmastectomy lymphedema syndrome: Secondary | ICD-10-CM | POA: Diagnosis not present

## 2016-10-25 DIAGNOSIS — M25611 Stiffness of right shoulder, not elsewhere classified: Secondary | ICD-10-CM

## 2016-10-25 NOTE — Therapy (Signed)
[place], [place], [address] Phone: [phone]   Fax:  [phone]  Physical Therapy Treatment  Patient Details  Name: Kylie Tucker MRN: [id number] Date of Birth: [birth year] Referring Provider: Alphonsa Overall  Encounter Date: 10/25/2016   Pt states that she is not using the pump or reidsleeve at night to control her edema.  Pt denies pain.       PT End of Session - 10/25/16 1428    Visit Number 12   Number of Visits 12   Date for PT Re-Evaluation 09/23/16   Authorization Type [employer]   Authorization - Visit Number 12   Authorization - Number of Visits 12   PT Start Time 0258  pt was late   PT Stop Time 1355   PT Time Calculation (min) 47 min   Activity Tolerance Patient tolerated treatment well   Behavior During Therapy WFL for tasks assessed/performed      Past Medical History:  Diagnosis Date  . Allergy   . Anxiety   . Bilateral ovarian cysts    Do yearly ultrasound.  . Cancer ([place]) rt breast   2004/ chemo/ radiation  . Cellulitis and abscess of upper arm and forearm   . Family history of breast cancer   . Family history of rectal cancer   . GERD (gastroesophageal reflux disease)   . H/O Helicobacter infection 2017  . History of right mastectomy 2004   chemo/ radiation  . Hyperlipidemia   . Hypertension   . Insomnia   . Left bundle branch block 2016  . Migraines   . PONV (postoperative nausea and vomiting)    cannot take zofran  . Postmastectomy lymphedema rt side  . Sentinel node     Past Surgical History:  Procedure Laterality Date  . BREAST LUMPECTOMY  2004    br bx  . BREAST LUMPECTOMY WITH AXILLARY LYMPH NODE DISSECTION    . CARPAL TUNNEL RELEASE    . CATARACT EXTRACTION Left 07/12/2013  . MANDIBLE SURGERY  1991  . MOUTH SURGERY    . NASAL SINUS SURGERY  1990/2001  . NASAL SINUS SURGERY    . ORIF FINGER FRACTURE  02/14/2012   Procedure: OPEN REDUCTION INTERNAL  FIXATION (ORIF) METACARPAL (FINGER) FRACTURE;  Surgeon: Wynonia Sours, MD;  Location: [facility];  Service: Orthopedics;  Laterality: Right;  RIGHT FIFTH   . ORIF METACARPAL FRACTURE  8/13   rt   . PORT-A-CATH REMOVAL     in and now out  . REPAIR EXTENSOR TENDON Left 05/30/2013   Procedure: RELEASE TRANSPOSITION EXTENSOR POLLICUS LONGUS LEFT WRIST;  Surgeon: Wynonia Sours, MD;  Location: [facility];  Service: Orthopedics;  Laterality: Left;  . rt mastectomy  2004   lymph nodes-7-axillary node dissection  . SHOULDER ARTHROSCOPY WITH ROTATOR CUFF REPAIR AND SUBACROMIAL DECOMPRESSION Left 11/22/2013   Procedure: LEFT SHOULDER ARTHROSCOPY WITH SUBACROMIAL DECOMPRESSION DISTAL CLAVICLE RESECTION REPAIR  ROTATOR CUFF AS NEEDED ;  Surgeon: Cammie Sickle., MD;  Location: [facility];  Service: Orthopedics;  Laterality: Left;  . WRIST ARTHROSCOPY Left 2014    There were no vitals filed for this visit.          [facility] PT Assessment - 10/25/16 0001      Assessment   Medical Diagnosis Rt UE lymphedema     AROM   Right Shoulder Flexion 160 Degrees  as measured on 4/3 and unchanged  since 2/13   Right Shoulder ABduction 160 Degrees  as measured 4/3 and unchanged since 2/13   Right Shoulder External Rotation 65 Degrees  as measured 4/3 and unchanged since 2/13           LYMPHEDEMA/ONCOLOGY QUESTIONNAIRE - 10/25/16 1314      Right Upper Extremity Lymphedema   At Axilla  40 cm  was 35.5 on 2/13   15 cm Proximal to Olecranon Process 38 cm  was 35 cm on 2/13   10 cm Proximal to Olecranon Process 35 cm  was 32.9 cm on 2/13   Olecranon Process 29.5 cm  was 28 cm on 2/13   15 cm Proximal to Ulnar Styloid Process 27 cm  was 28 cm on 2/13   10 cm Proximal to Ulnar Styloid Process 24 cm  was 24.8 cm on 2/13   Just Proximal to Ulnar Styloid Process 18 cm  was 17.5 cm on 2/13   Across Hand at [employer] 19 cm  was 19.2 on 2/13   At [place]  of 2nd Digit 6.8 cm  was 7cm on 2/13   At [facility] of Thumb 7 cm  was 7 cm on 2/13     Left Upper Extremity Lymphedema   At Axilla  33.2 cm   15 cm Proximal to Olecranon Process 32.6 cm   10 cm Proximal to Olecranon Process 29.5 cm   Olecranon Process 25.2 cm   15 cm Proximal to Ulnar Styloid Process 23.6 cm   10 cm Proximal to Ulnar Styloid Process 20.3 cm   Just Proximal to Ulnar Styloid Process 16 cm   Across Hand at [employer] 19 cm   At [place] of 2nd Digit 6.8 cm   At [facility] of Thumb 7 cm                  [facility] Adult PT Treatment/Exercise - 10/25/16 0001      Manual Therapy   Manual Therapy Compression Bandaging   Manual therapy comments seperate from all other skilled care   Compression Bandaging to Rt UE using 1/4" foam, isoband, cotton and multilayer short stretch bandages.                PT Education - 10/25/16 1427    Education provided Yes   Education Details Educated to continue bandaging daily, using pump/manual massage and moisturizing.   Person(s) Educated Patient   Methods Explanation   Comprehension Verbalized understanding          PT Short Term Goals - 10/25/16 1436      PT SHORT TERM GOAL #1   Title Pt ROM for flexion to be 170 or above to allow pt to reach into higher cabinets.    Time 2   Period Weeks   Status Not Met     PT SHORT TERM GOAL #2   Title Pt to understand the benefit of using her compression pump everyday to reduce flair ups of lymphedema.   Time 2   Period Weeks   Status Achieved     PT SHORT TERM GOAL #3   Title Be to begin self manual techniques to help to reduce lymphedema   Time 2   Period Weeks   Status Partially Met  educated but has not completed           PT Long Term Goals - 10/25/16 1437      PT LONG TERM GOAL #1   Title Pt  to have decreased by 2 cm to allow pt to don shirts with ease.   Time 4   Period Weeks   Status Partially Met     PT LONG TERM GOAL #2   Title Pt to be able to don  her compression garment and to verbalize the importance of wearing her garment daily    Time 4   Period Weeks   Status Deferred  pt does not plan to use compression garment               Plan - 11-03-2016 1428    Clinical Impression Statement Pt has completed 12 sessions over the past 8 weeks with focus on improving congestion in Rt UE.  Pt remeasured this session with limited change in  volume in UE since beginning therapy.  Levels fluctuate weekly.  Pt's spouse is re-bandaging Rt UE every day, however admits to not completing self massage or using the pump.  Pt is also unable to use Reidsleeve as reports discomfort with this and verbalizes no intentions of returning to compression garment.  with this in mind, pt and spouse are now independent in maintenance care of Rt UE.  Pt is no longer in need of skilled therapy and agrees to discharge at this point.    Rehab Potential Good   PT Frequency 3x / week   PT Duration 4 weeks   PT Treatment/Interventions ADLs/Self Care Home Management;Manual techniques;Therapeutic exercise;Passive range of motion   PT Next Visit Plan Discharge as no lasting improvement in volume of Rt UE.  Pt is independent in self bandaging using short stretch and will continue this as opposed to compression garment.     PT Home Exercise Plan 3/15: wand flexion, abduction and IR/ER, pt refused need for handouts   Consulted and Agree with Plan of Care Patient      Patient will benefit from skilled therapeutic intervention in order to improve the following deficits and impairments:  Decreased range of motion, Increased edema  Visit Diagnosis: Postmastectomy lymphedema  Stiffness of right shoulder, not elsewhere classified       G-Codes - 03-Nov-2016 1452    Functional Limitation Other PT primary   Other PT Primary Goal Status (E0923) At least 1 percent but less than 20 percent impaired, limited or restricted   Other PT Primary Discharge Status (R0076) At least 20  percent but less than 40 percent impaired, limited or restricted      Problem List Patient Active Problem List   Diagnosis Date Noted  . Sleep talking 07/22/2016  . Genetic testing 06/01/2016  . Family history of breast cancer   . Family history of rectal cancer   . Chest pain 09/23/2015  . Abnormal vision 06/13/2015  . Anxiety disorder 06/13/2015  . Cataract cortical, senile 06/13/2015  . Clinical depression 06/13/2015  . Cephalalgia 06/13/2015  . H/O malignant neoplasm of breast 06/13/2015  . 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 Sinus infection 06/13/2015  . Long term current use of systemic steroids 06/13/2015  . Chronic systolic CHF (congestive heart failure), NYHA class 1 ([place]) 05/09/2015  . LBBB (left bundle branch block) 03/26/2015  . Lymphedema of arm 12/13/2013  . Complete rupture of rotator cuff 11/22/2013  . Nipple discharge in female 12/01/2012  . History of breast cancer 11/30/2012  . Epiphora 11/28/2012  . Disorder of endocrine system 04/25/2012  . Ceratitis 04/25/2012  . Blepharitis 04/25/2012  . Breast cancer, right breast ([place])   . FATIGUE 10/01/2009  . CAROTID  BRUIT 10/01/2009  . Dyspnea 10/01/2009  . HYPERLIPIDEMIA 09/30/2009    Teena Irani, PTA/CLT [phone]  10/25/2016, 2:53 PM  [place], [place], [address] Phone: [phone]   Fax:  [phone]  Name: Kylie Tucker MRN: [id number] Date of Birth: 1950/08/08  PHYSICAL THERAPY DISCHARGE SUMMARY  Visits from Start of Care: *12 Current functional level related to goals / functional outcomes: See above    Remaining deficits: ROM and swelling    Education / Equipment: The benefit of using her pump and Reidsleeve.  Plan: Patient agrees to discharge.  Patient goals were not met. Patient is being discharged due to lack of progress.  ?????        Rayetta Humphrey, [place] CLT [phone]

## 2016-10-26 DIAGNOSIS — Z6829 Body mass index (BMI) 29.0-29.9, adult: Secondary | ICD-10-CM | POA: Diagnosis not present

## 2016-10-26 DIAGNOSIS — E663 Overweight: Secondary | ICD-10-CM | POA: Diagnosis not present

## 2016-10-26 DIAGNOSIS — R3911 Hesitancy of micturition: Secondary | ICD-10-CM | POA: Diagnosis not present

## 2016-10-26 DIAGNOSIS — N3946 Mixed incontinence: Secondary | ICD-10-CM | POA: Diagnosis not present

## 2016-10-27 ENCOUNTER — Ambulatory Visit: Payer: PPO | Admitting: Nurse Practitioner

## 2016-11-12 DIAGNOSIS — K581 Irritable bowel syndrome with constipation: Secondary | ICD-10-CM | POA: Diagnosis not present

## 2016-11-22 DIAGNOSIS — I427 Cardiomyopathy due to drug and external agent: Secondary | ICD-10-CM | POA: Diagnosis not present

## 2016-11-22 DIAGNOSIS — I428 Other cardiomyopathies: Secondary | ICD-10-CM | POA: Diagnosis not present

## 2016-11-22 DIAGNOSIS — T451X5A Adverse effect of antineoplastic and immunosuppressive drugs, initial encounter: Secondary | ICD-10-CM | POA: Diagnosis not present

## 2016-11-22 DIAGNOSIS — R0602 Shortness of breath: Secondary | ICD-10-CM | POA: Diagnosis not present

## 2016-11-22 DIAGNOSIS — I447 Left bundle-branch block, unspecified: Secondary | ICD-10-CM | POA: Diagnosis not present

## 2016-11-28 ENCOUNTER — Emergency Department (HOSPITAL_COMMUNITY): Payer: PPO

## 2016-11-28 ENCOUNTER — Encounter (HOSPITAL_COMMUNITY): Payer: Self-pay | Admitting: Emergency Medicine

## 2016-11-28 ENCOUNTER — Emergency Department (HOSPITAL_COMMUNITY)
Admission: EM | Admit: 2016-11-28 | Discharge: 2016-11-28 | Disposition: A | Discharge status: Home / Self Care | Payer: PPO | Attending: Emergency Medicine | Admitting: Emergency Medicine

## 2016-11-28 DIAGNOSIS — Z853 Personal history of malignant neoplasm of breast: Secondary | ICD-10-CM | POA: Diagnosis not present

## 2016-11-28 DIAGNOSIS — R112 Nausea with vomiting, unspecified: Secondary | ICD-10-CM | POA: Diagnosis present

## 2016-11-28 DIAGNOSIS — I5022 Chronic systolic (congestive) heart failure: Secondary | ICD-10-CM | POA: Diagnosis not present

## 2016-11-28 DIAGNOSIS — E871 Hypo-osmolality and hyponatremia: Secondary | ICD-10-CM | POA: Diagnosis not present

## 2016-11-28 DIAGNOSIS — I110 Hypertensive heart disease with heart failure: Secondary | ICD-10-CM | POA: Insufficient documentation

## 2016-11-28 DIAGNOSIS — R1013 Epigastric pain: Secondary | ICD-10-CM | POA: Diagnosis not present

## 2016-11-28 DIAGNOSIS — Z79899 Other long term (current) drug therapy: Secondary | ICD-10-CM | POA: Insufficient documentation

## 2016-11-28 DIAGNOSIS — R1010 Upper abdominal pain, unspecified: Secondary | ICD-10-CM | POA: Diagnosis not present

## 2016-11-28 DIAGNOSIS — R1110 Vomiting, unspecified: Secondary | ICD-10-CM

## 2016-11-28 DIAGNOSIS — R109 Unspecified abdominal pain: Secondary | ICD-10-CM | POA: Diagnosis present

## 2016-11-28 LAB — COMPREHENSIVE METABOLIC PANEL
ALBUMIN: 4 g/dL (ref 3.5–5.0)
ALK PHOS: 71 U/L (ref 38–126)
ALT: 25 U/L (ref 14–54)
ANION GAP: 13 (ref 5–15)
AST: 32 U/L (ref 15–41)
BUN: 13 mg/dL (ref 6–20)
CALCIUM: 9.2 mg/dL (ref 8.9–10.3)
CO2: 21 mmol/L — ABNORMAL LOW (ref 22–32)
Chloride: 90 mmol/L — ABNORMAL LOW (ref 101–111)
Creatinine, Ser: 0.82 mg/dL (ref 0.44–1.00)
GFR calc Af Amer: >60 mL/min (ref >60)
GLUCOSE: 167 mg/dL — AB (ref 65–99)
POTASSIUM: 3.9 mmol/L (ref 3.5–5.1)
Sodium: 124 mmol/L — ABNORMAL LOW (ref 135–145)
Total Bilirubin: 0.5 mg/dL (ref 0.3–1.2)
Total Protein: 7.2 g/dL (ref 6.5–8.1)

## 2016-11-28 LAB — CBC
HCT: 43.4 % (ref 36.0–46.0)
HEMOGLOBIN: 15.6 g/dL — AB (ref 12.0–15.0)
MCH: 30.1 pg (ref 26.0–34.0)
MCHC: 35.9 g/dL (ref 30.0–36.0)
MCV: 83.6 fL (ref 78.0–100.0)
Platelets: 297 10*3/uL (ref 150–400)
RBC: 5.19 MIL/uL — ABNORMAL HIGH (ref 3.87–5.11)
RDW: 12.9 % (ref 11.5–15.5)
WBC: 10.8 10*3/uL — AB (ref 4.0–10.5)

## 2016-11-28 LAB — URINALYSIS, ROUTINE W REFLEX MICROSCOPIC
BILIRUBIN URINE: NEGATIVE
Bacteria, UA: NONE SEEN
Glucose, UA: 50 mg/dL — AB
Hgb urine dipstick: NEGATIVE
KETONES UR: 5 mg/dL — AB
LEUKOCYTES UA: NEGATIVE
NITRITE: NEGATIVE
PH: 8 (ref 5.0–8.0)
Protein, ur: NEGATIVE mg/dL
SPECIFIC GRAVITY, URINE: 1.012 (ref 1.005–1.030)
Squamous Epithelial / LPF: NONE SEEN

## 2016-11-28 LAB — LIPASE, BLOOD: Lipase: 20 U/L (ref 11–51)

## 2016-11-28 MED ORDER — SODIUM CHLORIDE 0.9 % IV BOLUS (SEPSIS)
1000.0000 mL | Freq: Once | INTRAVENOUS | Status: AC
  Administered 2016-11-28: 1000 mL via INTRAVENOUS

## 2016-11-28 MED ORDER — PROMETHAZINE HCL 25 MG RE SUPP: 25.0000 mg | Freq: Four times a day (QID) | RECTAL | 0 refills | Status: DC | PRN

## 2016-11-28 MED ORDER — PROMETHAZINE HCL 25 MG/ML IJ SOLN
12.5000 mg | Freq: Once | INTRAMUSCULAR | Status: AC
  Administered 2016-11-28: 12.5 mg via INTRAVENOUS
  Filled 2016-11-28: qty 1

## 2016-11-28 MED ORDER — SODIUM CHLORIDE 0.9 % IV SOLN
INTRAVENOUS | Status: DC
  Administered 2016-11-28: 15:00:00 via INTRAVENOUS

## 2016-11-28 MED ORDER — PANTOPRAZOLE SODIUM 40 MG IV SOLR
40.0000 mg | Freq: Once | INTRAVENOUS | Status: AC
  Administered 2016-11-28: 40 mg via INTRAVENOUS
  Filled 2016-11-28: qty 40

## 2016-11-28 NOTE — ED Provider Notes (Addendum)
[facility] DEPT Provider Note   CSN: [id number] Arrival date & time: 11/28/16  1326     History   Chief Complaint Chief Complaint  Patient presents with  . Abdominal Pain    HPI Kylie Tucker is a 67 y.o. female.  Patient with a long-term problem with epigastric abdominal pain nausea and vomiting. Last seen for this April 10. Patient states pain is a burning epigastric pain. Started yesterday. Associated with greater than 10 episodes of vomiting no diarrhea no fevers. Patient has run out of her Protonix. Does have Phenergan at home. Patient followed by [facility] the past had an upper endoscopy done about a year ago. They have been unable to determine exactly why she has these episodes. But does have a history of gastroesophageal reflux disease. Patient had an ultrasound done in April without any significant findings. Patient had CT scan done in March without any significant abnormalities. Patient does not know why this keeps occurring. Patient has a past history of right-sided breast cancer. Patient denies any vomiting of blood. Patient's primary care doctor is Dr. Riley Kill. [facility].  Correction patient has not had CT scan in March. Last CT scan was in the 2017.      Past Medical History:  Diagnosis Date  . Allergy   . Anxiety   . Bilateral ovarian cysts    Do yearly ultrasound.  . Cancer ([place]) rt breast   2004/ chemo/ radiation  . Cellulitis and abscess of upper arm and forearm   . Family history of breast cancer   . Family history of rectal cancer   . GERD (gastroesophageal reflux disease)   . H/O Helicobacter infection 2017  . History of right mastectomy 2004   chemo/ radiation  . Hyperlipidemia   . Hypertension   . Insomnia   . Left bundle branch block 2016  . Migraines   . PONV (postoperative nausea and vomiting)    cannot take zofran  . Postmastectomy lymphedema rt side  . Sentinel node     Patient Active Problem List   Diagnosis Date Noted  .  Sleep talking 07/22/2016  . Genetic testing 06/01/2016  . Family history of breast cancer   . Family history of rectal cancer   . Chest pain 09/23/2015  . Abnormal vision 06/13/2015  . Anxiety disorder 06/13/2015  . Cataract cortical, senile 06/13/2015  . Clinical depression 06/13/2015  . Cephalalgia 06/13/2015  . H/O malignant neoplasm of breast 06/13/2015  . 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 Sinus infection 06/13/2015  . Long term current use of systemic steroids 06/13/2015  . Chronic systolic CHF (congestive heart failure), NYHA class 1 ([place]) 05/09/2015  . LBBB (left bundle branch block) 03/26/2015  . Lymphedema of arm 12/13/2013  . Complete rupture of rotator cuff 11/22/2013  . Nipple discharge in female 12/01/2012  . History of breast cancer 11/30/2012  . Epiphora 11/28/2012  . Disorder of endocrine system 04/25/2012  . Ceratitis 04/25/2012  . Blepharitis 04/25/2012  . Breast cancer, right breast ([place])   . FATIGUE 10/01/2009  . 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 Dyspnea 10/01/2009  . HYPERLIPIDEMIA 09/30/2009    Past Surgical History:  Procedure Laterality Date  . BREAST LUMPECTOMY  2004    br bx  . BREAST LUMPECTOMY WITH AXILLARY LYMPH NODE DISSECTION    . CARPAL TUNNEL RELEASE    . CATARACT EXTRACTION Left 07/12/2013  . MANDIBLE SURGERY  1991  . MOUTH SURGERY    . NASAL SINUS SURGERY  1990/2001  .  NASAL SINUS SURGERY    . ORIF FINGER FRACTURE  02/14/2012   Procedure: OPEN REDUCTION INTERNAL FIXATION (ORIF) METACARPAL (FINGER) FRACTURE;  Surgeon: Wynonia Sours, MD;  Location: [facility];  Service: Orthopedics;  Laterality: Right;  RIGHT FIFTH   . ORIF METACARPAL FRACTURE  8/13   rt   . PORT-A-CATH REMOVAL     in and now out  . REPAIR EXTENSOR TENDON Left 05/30/2013   Procedure: RELEASE TRANSPOSITION EXTENSOR POLLICUS LONGUS LEFT WRIST;  Surgeon: Wynonia Sours, MD;  Location: [facility];  Service: Orthopedics;  Laterality: Left;  . rt mastectomy   2004   lymph nodes-7-axillary node dissection  . SHOULDER ARTHROSCOPY WITH ROTATOR CUFF REPAIR AND SUBACROMIAL DECOMPRESSION Left 11/22/2013   Procedure: LEFT SHOULDER ARTHROSCOPY WITH SUBACROMIAL DECOMPRESSION DISTAL CLAVICLE RESECTION REPAIR  ROTATOR CUFF AS NEEDED ;  Surgeon: Cammie Sickle., MD;  Location: [facility];  Service: Orthopedics;  Laterality: Left;  . WRIST ARTHROSCOPY Left 2014    OB History    Gravida Para Term Preterm AB Living   3 2     1 2    SAB TAB Ectopic Multiple Live Births   0 1             Home Medications    Prior to Admission medications   Medication Sig Start Date End Date Taking? Authorizing Provider  ALPRAZOLAM XR 3 MG 24 hr tablet Take 3 mg by mouth at bedtime. 06/30/16  Yes [provider]  BELSOMRA 15 MG TABS Take 1 tablet by mouth at bedtime. 11/24/16  Yes [provider]  Cholecalciferol (VITAMIN D) 2000 UNITS CAPS Take 1 capsule by mouth daily.    Yes [provider]  eletriptan (RELPAX) 40 MG tablet Take 40 mg by mouth as needed for migraine or headache. Reported on 10/20/2015   Yes [provider]  furosemide (LASIX) 20 MG tablet Take 1 tablet by mouth daily. 11/24/16  Yes [provider]  metoprolol succinate (TOPROL-XL) 50 MG 24 hr tablet Take 1 tablet by mouth daily. 08/23/16  Yes [provider]  Multiple Vitamins-Minerals (MULTIVITAMINS THER. W/MINERALS) TABS Take 1 tablet by mouth daily.     Yes [provider]  polyethylene glycol powder (GLYCOLAX/MIRALAX) powder Take 17 g by mouth daily. 11/12/16  Yes [provider]  Probiotic Product (PROBIOTIC DAILY PO) Take 1 tablet by mouth daily.    Yes [provider]  promethazine (PHENERGAN) 25 MG suppository Place 1 suppository (25 mg total) rectally every 6 (six) hours as needed for nausea or vomiting. 10/20/16  Yes Rolland Porter, MD    Family History Family History  Problem Relation Age of Onset  .  Heart disease Father   . Heart disease Mother   . Hypertension Mother   . Rectal cancer Mother        dx in her early 38s  . Colon polyps Mother   . Breast cancer Maternal Aunt        dx in her early 65s  . Heart disease Paternal Uncle   . Heart disease Paternal Grandmother   . Heart disease Paternal Grandfather   . Diabetes Brother   . Breast cancer Maternal Grandmother        dx in her 37s  . Breast cancer Cousin        dx in early 52s  . Breast cancer Cousin 110  . Hepatitis C Son   . Hypertension Son   .  Post-traumatic stress disorder Son     Social History Social History  Substance Use Topics  . Smoking status: Never Smoker  . Smokeless tobacco: Never Used  . Alcohol use No     Allergies   Acetaminophen; Amoxicillin-pot clavulanate; Azithromycin; Cefuroxime axetil; Cephalexin; Clonazepam; Hydrocodone-acetaminophen; Hydrocodone-acetaminophen; Levofloxacin; Morphine and related; Ondansetron; Propoxyphene n-acetaminophen; Rosuvastatin; Sulfonamide derivatives; Tramadol; Trazodone and nefazodone; Vioxx [rofecoxib]; Zofran [ondansetron hcl]; Clarithromycin; Codeine; Seroquel [quetiapine fumarate]; and Sulfamethoxazole   Review of Systems Review of Systems  Constitutional: Negative for fever.  HENT: Negative for congestion.   Eyes: Negative for redness.  Respiratory: Negative for shortness of breath.   Cardiovascular: Negative for chest pain.  Gastrointestinal: Positive for abdominal pain, nausea and vomiting. Negative for diarrhea.  Genitourinary: Negative for dysuria.  Musculoskeletal: Negative for back pain.  Skin: Negative for rash.  Neurological: Negative for headaches.  Hematological: Does not bruise/bleed easily.  Psychiatric/Behavioral: Negative for confusion.     Physical Exam Updated Vital Signs BP (!) 174/69 (BP Location: Left Arm)   Pulse 79   Temp 97.5 F (36.4 C) (Oral)   Resp 18   Ht 5' 2.25" (1.581 m)   Wt 166 lb (75.3 kg)   LMP 01/10/2003    SpO2 98%   BMI 30.12 kg/m   Physical Exam  Constitutional: She is oriented to person, place, and time. She appears well-developed and well-nourished. No distress.  HENT:  Head: Normocephalic and atraumatic.  Right Ear: External ear normal.  Eyes: Conjunctivae and EOM are normal. Pupils are equal, round, and reactive to light.  Neck: Normal range of motion. Neck supple.  Cardiovascular: Normal rate, regular rhythm and normal heart sounds.   Pulmonary/Chest: Effort normal and breath sounds normal. No respiratory distress.  Abdominal: Soft. Bowel sounds are normal. There is tenderness. There is no guarding.  Mild tenderness epigastric area.  Musculoskeletal: Normal range of motion.  Neurological: She is alert and oriented to person, place, and time. No cranial nerve deficit. She exhibits normal muscle tone. Coordination normal.  Skin: Skin is warm. No rash noted.  Nursing note and vitals reviewed.    ED Treatments / Results  Labs (all labs ordered are listed, but only abnormal results are displayed) Labs Reviewed  COMPREHENSIVE METABOLIC PANEL - Abnormal; Notable for the following:       Result Value   Sodium 124 (*)    Chloride 90 (*)    CO2 21 (*)    Glucose, Bld 167 (*)    All other components within normal limits  CBC - Abnormal; Notable for the following:    WBC 10.8 (*)    RBC 5.19 (*)    Hemoglobin 15.6 (*)    All other components within normal limits  LIPASE, BLOOD  URINALYSIS, ROUTINE W REFLEX MICROSCOPIC   Results for orders placed or performed during the hospital encounter of 11/28/16  Lipase, blood  Result Value Ref Range   Lipase 20 11 - 51 U/L  Comprehensive metabolic panel  Result Value Ref Range   Sodium 124 (L) 135 - 145 mmol/L   Potassium 3.9 3.5 - 5.1 mmol/L   Chloride 90 (L) 101 - 111 mmol/L   CO2 21 (L) 22 - 32 mmol/L   Glucose, Bld 167 (H) 65 - 99 mg/dL   BUN 13 6 - 20 mg/dL   Creatinine, Ser 0.82 0.44 - 1.00 mg/dL   Calcium 9.2 8.9 - 10.3  mg/dL   Total Protein 7.2 6.5 - 8.1 g/dL   Albumin 4.0 3.5 -  5.0 g/dL   AST 32 15 - 41 U/L   ALT 25 14 - 54 U/L   Alkaline Phosphatase 71 38 - 126 U/L   Total Bilirubin 0.5 0.3 - 1.2 mg/dL   GFR calc non Af Amer >60 >60 mL/min   GFR calc Af Amer >60 >60 mL/min   Anion gap 13 5 - 15  CBC  Result Value Ref Range   WBC 10.8 (H) 4.0 - 10.5 K/uL   RBC 5.19 (H) 3.87 - 5.11 MIL/uL   Hemoglobin 15.6 (H) 12.0 - 15.0 g/dL   HCT 43.4 36.0 - 46.0 %   MCV 83.6 78.0 - 100.0 fL   MCH 30.1 26.0 - 34.0 pg   MCHC 35.9 30.0 - 36.0 g/dL   RDW 12.9 11.5 - 15.5 %   Platelets 297 150 - 400 K/uL     EKG  EKG Interpretation  Date/Time:  Sunday Nov 28 2016 14:48:43 EDT Ventricular Rate:  82 PR Interval:    QRS Duration: 171 QT Interval:  475 QTC Calculation: 555 R Axis:   -49 Text Interpretation:  Sinus rhythm Probable left atrial enlargement Left bundle branch block Baseline wander in lead(s) V5 Interpretation limited secondary to artifact Confirmed by Fredia Sorrow [phone]) on 11/28/2016 2:56:32 PM       Radiology No results found.  Procedures Procedures (including critical care time)  Medications Ordered in ED Medications  0.9 %  sodium chloride infusion ( Intravenous New Bag/Given 11/28/16 1440)  sodium chloride 0.9 % bolus 1,000 mL (1,000 mLs Intravenous New Bag/Given 11/28/16 1440)  promethazine (PHENERGAN) injection 12.5 mg (12.5 mg Intravenous Given 11/28/16 1441)  pantoprazole (PROTONIX) injection 40 mg (40 mg Intravenous Given 11/28/16 1442)     Initial Impression / Assessment and Plan / ED Course  I have reviewed the triage vital signs and the nursing notes.  Pertinent labs & imaging results that were available during my care of the patient were reviewed by me and considered in my medical decision making (see chart for details).    Patient with recurrent epigastric abdominal pains happen several times before. Followed by [facility] in the past. Has a follow-up  appointment in June. Patient states has upper endoscopies before's had ultrasounds of her abdomen CTs of her abdomen without any specific findings.  Patient states she's had multiple episodes of vomiting starting yesterday.  Patient's EKG shows a pre-existing left bundle branch block. Patient's labs significant for a sodium of 124. Otherwise just has a mild leukocytosis. Liver function tests and lipase without any abnormalities.  Chest x-ray results are pending.  Patient feels better with Protonix and Phenergan IV.   Patient followed by [facility].   Not concerned about an acute abdominal process. Patient CT scans and ultrasounds that show no evidence of any gallbladder disease past.  However due to the sodium being 124 feel the patient would benefit from an observation admission and IV hydration. We'll discuss with hospitalist.   Final Clinical Impressions(s) / ED Diagnoses   Final diagnoses:  Hyponatremia  Intractable vomiting with nausea, unspecified vomiting type  Epigastric abdominal pain    New Prescriptions New Prescriptions   No medications on file     Fredia Sorrow, MD 11/28/16 1513    Fredia Sorrow, MD 11/28/16 1515   Chest x-ray without any acute findings.       Fredia Sorrow, MD 11/28/16 [phone]

## 2016-11-28 NOTE — Consult Note (Signed)
Consult Note   Kylie Tucker HYW:[id number] DOB: 06-03-1951 DOA: 11/28/2016  PCP: Redmond School, MD Consultants:  [facility] - GI; [facility] - endocrinology (first appt this Wednesday); [facility] - cardiology Patient coming from: home - lives with husband; NOK: husband, [phone]  Chief Complaint: n/v  HPI: Kylie Tucker is a 66 y.o. female with medical history significant of remote breast cancer s/p mastectomy with residual RUE lymphedema; LBBB; HTN; HLD; and recurrent RUQ/midepigastric abdominal pain presenting with n/v.  She reports that she "got sick" yesterday morning and it continued to worsen.  She vomited 10+ times, no hematemesis.  The last time she vomited was in the waiting room, and she thinks the Phenergan has helped.  She does have chronic RUQ/midepigastric pain for which she sees Dr. Wallis Mart.  She also presented with similar symptoms on 4/11 to the ER and was discharged to home; her sodium at that time was 128, and the family does not remember this being mentioned to them.  She had a RUQ [place] on 4/10 which showed fatty liver but no gallstones.  She had a HIDA scan ordered in 6/17 but this test was never performed.  She denies sick contacts.  She ate a burger Friday night but thinks it was unrelated to symptoms.  She does have chronic urinary incontinence which is unchanged.    ED Course: Recurrent epigastric gastric pain with plan for f/u in June with GI.  Sodium 124.  Improved symptoms with Protonix and Phenergan.  Concern for need for admission/hydration due to sodium.  Review of Systems: As per HPI; otherwise review of systems reviewed and negative.    Past Medical History:  Diagnosis Date  . Allergy   . Anxiety   . Bilateral ovarian cysts    Do yearly ultrasound.  . Cancer ([place]) rt breast   2004/ chemo/ radiation  . Cellulitis and abscess of upper arm and forearm   . Family history of breast cancer   . Family history of rectal cancer   . GERD (gastroesophageal reflux  disease)   . H/O Helicobacter infection 2017  . History of right mastectomy 2004   chemo/ radiation  . Hyperlipidemia   . Hypertension   . Insomnia   . Left bundle branch block 2016  . Migraines   . PONV (postoperative nausea and vomiting)    cannot take zofran  . Postmastectomy lymphedema rt side  . Sentinel node     Past Surgical History:  Procedure Laterality Date  . CARPAL TUNNEL RELEASE    . CATARACT EXTRACTION Left 07/12/2013  . MANDIBLE SURGERY  1991  . NASAL SINUS SURGERY  1990/2001  . ORIF FINGER FRACTURE  02/14/2012   Procedure: OPEN REDUCTION INTERNAL FIXATION (ORIF) METACARPAL (FINGER) FRACTURE;  Surgeon: Wynonia Sours, MD;  Location: [place];  Service: Orthopedics;  Laterality: Right;  RIGHT FIFTH   . ORIF METACARPAL FRACTURE  8/13   rt   . PORT-A-CATH REMOVAL     in and now out  . REPAIR EXTENSOR TENDON Left 05/30/2013   Procedure: RELEASE TRANSPOSITION EXTENSOR POLLICUS LONGUS LEFT WRIST;  Surgeon: Wynonia Sours, MD;  Location: [place];  Service: Orthopedics;  Laterality: Left;  . rt mastectomy  2004   lymph nodes-7-axillary node dissection  . SHOULDER ARTHROSCOPY WITH ROTATOR CUFF REPAIR AND SUBACROMIAL DECOMPRESSION Left 11/22/2013   Procedure: LEFT SHOULDER ARTHROSCOPY WITH SUBACROMIAL DECOMPRESSION DISTAL CLAVICLE RESECTION REPAIR  ROTATOR CUFF AS NEEDED ;  Surgeon: Cammie Sickle.,  MD;  Location: [place];  Service: Orthopedics;  Laterality: Left;    Social History   Social History  . Marital status: Married    Spouse name: N/A  . Number of children: N/A  . Years of education: N/A   Occupational History  . retired    Social History Main Topics  . Smoking status: Never Smoker  . Smokeless tobacco: Never Used  . Alcohol use No  . Drug use: No  . Sexual activity: Yes    Partners: Male    Birth control/ protection: Post-menopausal   Other Topics Concern  . Not on file   Social History Narrative   . No narrative on file    Allergies  Allergen Reactions  . Acetaminophen Nausea Only    Other reaction(s): Other (See Comments)  . Amoxicillin-Pot Clavulanate Nausea Only    flushing  . Azithromycin     Heart palpitations   . Cefuroxime Axetil Nausea Only  . Cephalexin Nausea Only  . Clonazepam     Heart had a "weird feeling" and difficulty breathing  . Hydrocodone-Acetaminophen Hives  . Hydrocodone-Acetaminophen     Other reaction(s): GI Upset (intolerance)  . Levofloxacin Nausea Only  . Morphine And Related Other (See Comments)    Hallucinations  . Ondansetron Nausea And Vomiting  . Propoxyphene N-Acetaminophen Nausea And Vomiting  . Rosuvastatin     REACTION: myalgia  . Sulfonamide Derivatives Itching  . Tramadol Nausea And Vomiting  . Trazodone And Nefazodone Nausea Only    Felt like "I was burning all over" -patient  . Vioxx [Rofecoxib] Nausea And Vomiting  . Zofran [Ondansetron Hcl] Nausea And Vomiting  . Clarithromycin Nausea Only and Anxiety  . Codeine Itching and Rash  . Seroquel [Quetiapine Fumarate] Palpitations    Heart racing   . Sulfamethoxazole Rash    Family History  Problem Relation Age of Onset  . Heart disease Father   . Heart disease Mother   . Hypertension Mother   . Rectal cancer Mother        dx in her early 48s  . Colon polyps Mother   . Breast cancer Maternal Aunt        dx in her early 60s  . Heart disease Paternal Uncle   . Heart disease Paternal Grandmother   . Heart disease Paternal Grandfather   . Diabetes Brother   . Breast cancer Maternal Grandmother        dx in her 7s  . Breast cancer Cousin        dx in early 25s  . Breast cancer Cousin 6  . Hepatitis C Son   . Hypertension Son   . Post-traumatic stress disorder Son     Prior to Admission medications   Medication Sig Start Date End Date Taking? Authorizing Provider  ALPRAZOLAM XR 3 MG 24 hr tablet Take 3 mg by mouth at bedtime. 06/30/16  Yes [provider]  BELSOMRA 15 MG TABS Take 1 tablet by mouth at bedtime. 11/24/16  Yes [provider]  Cholecalciferol (VITAMIN D) 2000 UNITS CAPS Take 1 capsule by mouth daily.    Yes [provider]  eletriptan (RELPAX) 40 MG tablet Take 40 mg by mouth as needed for migraine or headache. Reported on 10/20/2015   Yes [provider]  furosemide (LASIX) 20 MG tablet Take 1 tablet by mouth daily. 11/24/16  Yes [provider]  metoprolol succinate (TOPROL-XL) 50 MG 24 hr tablet Take 1 tablet  by mouth daily. 08/23/16  Yes [provider]  Multiple Vitamins-Minerals (MULTIVITAMINS THER. W/MINERALS) TABS Take 1 tablet by mouth daily.     Yes [provider]  polyethylene glycol powder (GLYCOLAX/MIRALAX) powder Take 17 g by mouth daily. 11/12/16  Yes [provider]  Probiotic Product (PROBIOTIC DAILY PO) Take 1 tablet by mouth daily.    Yes [provider]  promethazine (PHENERGAN) 25 MG suppository Place 1 suppository (25 mg total) rectally every 6 (six) hours as needed for nausea or vomiting. 10/20/16  Yes Rolland Porter, MD    Physical Exam: Vitals:   11/28/16 1340 11/28/16 1535 11/28/16 1625  BP: (!) 174/69  (!) 169/83  Pulse: 79 86 77  Resp: 18 16 19   Temp: 97.5 F (36.4 C)    TempSrc: Oral    SpO2: 98% 96% 95%  Weight: 75.3 kg (166 lb)    Height: 5' 2.25" (1.581 m)       General:  Appears calm and comfortable and is NAD Eyes:  PERRL, EOMI, normal lids, iris ENT:  grossly normal hearing, lips & tongue, mmm Neck:  no LAD, masses or thyromegaly Cardiovascular:  RRR, no m/r/g. No LE edema.  Respiratory:  CTA bilaterally, no w/r/r. Normal respiratory effort. Abdomen:  soft, ntnd, NABS Skin:  no rash or induration seen on limited exam Musculoskeletal:  grossly normal tone BUE/BLE, good ROM, no bony abnormality.  Mild RUE lymphedema Psychiatric:  grossly normal mood and affect, speech fluent and appropriate, AOx3 Neurologic:  CN 2-12  grossly intact, moves all extremities in coordinated fashion, sensation intact  Labs on Admission: I have personally reviewed following labs and imaging studies  CBC:  Recent Labs Lab 11/28/16 1342  WBC 10.8*  HGB 15.6*  HCT 43.4  MCV 83.6  PLT 937   Basic Metabolic Panel:  Recent Labs Lab 11/28/16 1342  NA 124*  K 3.9  CL 90*  CO2 21*  GLUCOSE 167*  BUN 13  CREATININE 0.82  CALCIUM 9.2   GFR: Estimated Creatinine Clearance: 65.3 mL/min (by C-G formula based on SCr of 0.82 mg/dL). Liver Function Tests:  Recent Labs Lab 11/28/16 1342  AST 32  ALT 25  ALKPHOS 71  BILITOT 0.5  PROT 7.2  ALBUMIN 4.0    Recent Labs Lab 11/28/16 1342  LIPASE 20   No results for input(s): AMMONIA in the last 168 hours. Coagulation Profile: No results for input(s): INR, PROTIME in the last 168 hours. Cardiac Enzymes: No results for input(s): CKTOTAL, CKMB, CKMBINDEX, TROPONINI in the last 168 hours. BNP (last 3 results) No results for input(s): PROBNP in the last 8760 hours. HbA1C: No results for input(s): HGBA1C in the last 72 hours. CBG: No results for input(s): GLUCAP in the last 168 hours. Lipid Profile: No results for input(s): CHOL, HDL, LDLCALC, TRIG, CHOLHDL, LDLDIRECT in the last 72 hours. Thyroid Function Tests: No results for input(s): TSH, T4TOTAL, FREET4, T3FREE, THYROIDAB in the last 72 hours. Anemia Panel: No results for input(s): VITAMINB12, FOLATE, FERRITIN, TIBC, IRON, RETICCTPCT in the last 72 hours. Urine analysis:    Component Value Date/Time   COLORURINE STRAW (A) 11/28/2016 1510   APPEARANCEUR CLEAR 11/28/2016 1510   LABSPEC 1.012 11/28/2016 1510   PHURINE 8.0 11/28/2016 1510   GLUCOSEU 50 (A) 11/28/2016 1510   HGBUR NEGATIVE 11/28/2016 1510   BILIRUBINUR NEGATIVE 11/28/2016 1510   BILIRUBINUR - 09/10/2014 1130   KETONESUR 5 (A) 11/28/2016 1510   PROTEINUR NEGATIVE 11/28/2016 1510   UROBILINOGEN negative  09/10/2014 1130   UROBILINOGEN 0.2  06/29/2010 1418   NITRITE NEGATIVE 11/28/2016 1510   LEUKOCYTESUR NEGATIVE 11/28/2016 1510    Creatinine Clearance: Estimated Creatinine Clearance: 65.3 mL/min (by C-G formula based on SCr of 0.82 mg/dL).  Sepsis Labs: @LABRCNTIP (procalcitonin:4,lacticidven:4) )No results found for this or any previous visit (from the past 240 hour(s)).   Radiological Exams on Admission: Dg Chest 2 View  Result Date: 11/28/2016 CLINICAL DATA:  Upper abdominal pain EXAM: CHEST  2 VIEW COMPARISON:  09/19/2016 FINDINGS: Cardiomegaly. No confluent airspace opacities or effusions. Lingular scarring. No effusions or acute bony abnormality. IMPRESSION: No active disease. Electronically Signed   By: Rolm Baptise M.D.   On: 11/28/2016 15:17    EKG: Independently reviewed.  NSR with rate 83; LBBB with no evidence of acute ischemia  Assessment/Plan Principal Problem:   Hyponatremia Active Problems:   N&V (nausea and vomiting)   Abdominal pain   Pertinent labs: Na 124, prior 128 on 4/10 Glucose 167 WBC 10.8 UA negative  Hyponatremia -Previously normal until 4/18 -In 4/18, her Na++ was 128.  At that time, she also presented with n/v. -Today, her sodium is 124.  She is taking Lasix and again presents with n/v. -Suspect that volume depletion in conjunction with Lasix is the primary source of this issue. -She has already received IVF. -She is asymptomatic from a Na++ standpoint. -As such, I offered her the option on overnight observation with recheck in the AM vs. Outpatient f/u. -Since she has endocrinology f/u already scheduled for Wednesday, she prefers outpatient f/u.  N/V -May be related to chronic n/v. -Possibly also gastroenteritis. -Patient received Protonix, Phenergan, and IVF in the ER with resolution of symptoms.   -She prefers outpatient monitoring since she was able to pass her PO challenge.  Abdominal pain -Suggest outpatient HIDA scan for further evaluation. -This may be hindered by  her inability to tolerate pain medications (see allergy list). -Will order for outpatient study and note this on the order so that the nuclear medicine department can consider this issue.   Thank you for the opportunity to consult on this patient.   Karmen Bongo MD [facility]  If 7PM-7AM, please contact night-coverage [email or web address] Password [email or web address]  11/28/2016, 5:03 PM

## 2016-11-28 NOTE — ED Provider Notes (Signed)
Seen by Dr. Lorin Mercy. Patient is not wanting to be admitted. She is tolerating oral intake. Recommended discharge with outpatient follow-up. See hospitalist note.   Julianne Rice, MD 11/28/16 [phone]

## 2016-11-28 NOTE — ED Notes (Signed)
To radiology

## 2016-11-28 NOTE — ED Notes (Signed)
PO challenge per Dr Lorin Mercy- pt request to return home and follow with her physician

## 2016-11-28 NOTE — ED Triage Notes (Signed)
Patient c/o upper abd pain/burning that started this morning. Per patient nausea and vomiting but no diarrhea or fevers. Patient does report chills. Last normal BM yesterday-normal-no blood. Patient states has not voided since 9am.

## 2016-11-28 NOTE — ED Notes (Signed)
Pt sipping ginger ale - "slow" reports N with first sip

## 2016-11-28 NOTE — ED Notes (Signed)
Dr Lorin Mercy is at bedside

## 2016-11-28 NOTE — ED Notes (Signed)
OOB to BR

## 2016-11-30 ENCOUNTER — Encounter (HOSPITAL_COMMUNITY)
Admission: RE | Admit: 2016-11-30 | Discharge: 2016-11-30 | Disposition: A | Discharge status: Home / Self Care | Payer: PPO | Source: Ambulatory Visit | Attending: Internal Medicine | Admitting: Internal Medicine

## 2016-11-30 DIAGNOSIS — G43A1 Cyclical vomiting, intractable: Secondary | ICD-10-CM | POA: Diagnosis not present

## 2016-11-30 DIAGNOSIS — K5909 Other constipation: Secondary | ICD-10-CM | POA: Diagnosis not present

## 2016-11-30 DIAGNOSIS — R109 Unspecified abdominal pain: Secondary | ICD-10-CM | POA: Insufficient documentation

## 2016-12-01 DIAGNOSIS — E279 Disorder of adrenal gland, unspecified: Secondary | ICD-10-CM | POA: Diagnosis not present

## 2016-12-01 DIAGNOSIS — Z8639 Personal history of other endocrine, nutritional and metabolic disease: Secondary | ICD-10-CM | POA: Diagnosis not present

## 2016-12-01 DIAGNOSIS — Z136 Encounter for screening for cardiovascular disorders: Secondary | ICD-10-CM | POA: Diagnosis not present

## 2016-12-01 DIAGNOSIS — R112 Nausea with vomiting, unspecified: Secondary | ICD-10-CM | POA: Diagnosis not present

## 2016-12-01 DIAGNOSIS — E871 Hypo-osmolality and hyponatremia: Secondary | ICD-10-CM | POA: Diagnosis not present

## 2016-12-02 ENCOUNTER — Encounter (HOSPITAL_COMMUNITY): Payer: PPO

## 2016-12-09 ENCOUNTER — Encounter (HOSPITAL_COMMUNITY): Payer: PPO

## 2016-12-13 DIAGNOSIS — D485 Neoplasm of uncertain behavior of skin: Secondary | ICD-10-CM | POA: Diagnosis not present

## 2016-12-13 DIAGNOSIS — D225 Melanocytic nevi of trunk: Secondary | ICD-10-CM | POA: Diagnosis not present

## 2016-12-13 DIAGNOSIS — L308 Other specified dermatitis: Secondary | ICD-10-CM | POA: Diagnosis not present

## 2016-12-13 DIAGNOSIS — R112 Nausea with vomiting, unspecified: Secondary | ICD-10-CM | POA: Diagnosis not present

## 2016-12-13 DIAGNOSIS — B078 Other viral warts: Secondary | ICD-10-CM | POA: Diagnosis not present

## 2016-12-16 ENCOUNTER — Encounter (HOSPITAL_COMMUNITY): Payer: Self-pay

## 2016-12-16 ENCOUNTER — Encounter (HOSPITAL_COMMUNITY)
Admission: RE | Admit: 2016-12-16 | Discharge: 2016-12-16 | Disposition: A | Discharge status: Home / Self Care | Payer: PPO | Source: Ambulatory Visit | Attending: Internal Medicine | Admitting: Internal Medicine

## 2016-12-16 DIAGNOSIS — R109 Unspecified abdominal pain: Secondary | ICD-10-CM | POA: Diagnosis not present

## 2016-12-16 DIAGNOSIS — K8050 Calculus of bile duct without cholangitis or cholecystitis without obstruction: Secondary | ICD-10-CM | POA: Diagnosis not present

## 2016-12-16 MED ORDER — TECHNETIUM TC 99M MEBROFENIN IV KIT
5.0000 | PACK | Freq: Once | INTRAVENOUS | Status: AC | PRN
  Administered 2016-12-16: 4.9 via INTRAVENOUS

## 2016-12-24 ENCOUNTER — Encounter: Payer: Self-pay | Admitting: Genetics

## 2016-12-24 DIAGNOSIS — K581 Irritable bowel syndrome with constipation: Secondary | ICD-10-CM | POA: Diagnosis not present

## 2016-12-24 NOTE — Progress Notes (Signed)
Amendment: The date of this amended report is December 17, 2016.  Based on the ACMG standards and guidelines for the interpretation of sequence variants (Richards 2015) that utilizes a combination of sources, e.g., internal data, published literature, population databases and in silico models, the AXIN2, c.2272G>A (p.Ala758Thr) VUS has been reclassified to Likely Benign.

## 2017-01-05 DIAGNOSIS — H5319 Other subjective visual disturbances: Secondary | ICD-10-CM | POA: Diagnosis not present

## 2017-01-05 DIAGNOSIS — Z961 Presence of intraocular lens: Secondary | ICD-10-CM | POA: Diagnosis not present

## 2017-01-05 DIAGNOSIS — H524 Presbyopia: Secondary | ICD-10-CM | POA: Diagnosis not present

## 2017-01-05 DIAGNOSIS — H04123 Dry eye syndrome of bilateral lacrimal glands: Secondary | ICD-10-CM | POA: Diagnosis not present

## 2017-01-20 DIAGNOSIS — I10 Essential (primary) hypertension: Secondary | ICD-10-CM | POA: Diagnosis not present

## 2017-01-20 DIAGNOSIS — R600 Localized edema: Secondary | ICD-10-CM | POA: Diagnosis not present

## 2017-01-20 DIAGNOSIS — Z6828 Body mass index (BMI) 28.0-28.9, adult: Secondary | ICD-10-CM | POA: Diagnosis not present

## 2017-01-20 DIAGNOSIS — E782 Mixed hyperlipidemia: Secondary | ICD-10-CM | POA: Diagnosis not present

## 2017-01-20 DIAGNOSIS — I872 Venous insufficiency (chronic) (peripheral): Secondary | ICD-10-CM | POA: Diagnosis not present

## 2017-03-03 DIAGNOSIS — K219 Gastro-esophageal reflux disease without esophagitis: Secondary | ICD-10-CM | POA: Diagnosis not present

## 2017-03-03 DIAGNOSIS — G4700 Insomnia, unspecified: Secondary | ICD-10-CM | POA: Diagnosis not present

## 2017-03-03 DIAGNOSIS — Z681 Body mass index (BMI) 19 or less, adult: Secondary | ICD-10-CM | POA: Diagnosis not present

## 2017-03-03 DIAGNOSIS — Z1389 Encounter for screening for other disorder: Secondary | ICD-10-CM | POA: Diagnosis not present

## 2017-03-03 DIAGNOSIS — F419 Anxiety disorder, unspecified: Secondary | ICD-10-CM | POA: Diagnosis not present

## 2017-04-01 ENCOUNTER — Other Ambulatory Visit (HOSPITAL_COMMUNITY): Payer: Self-pay | Admitting: Gastroenterology

## 2017-04-01 ENCOUNTER — Other Ambulatory Visit: Payer: Self-pay | Admitting: Gastroenterology

## 2017-04-01 DIAGNOSIS — K5901 Slow transit constipation: Secondary | ICD-10-CM | POA: Diagnosis not present

## 2017-04-01 DIAGNOSIS — R112 Nausea with vomiting, unspecified: Secondary | ICD-10-CM

## 2017-04-01 DIAGNOSIS — R1110 Vomiting, unspecified: Secondary | ICD-10-CM

## 2017-04-05 ENCOUNTER — Telehealth: Payer: Self-pay | Admitting: Obstetrics and Gynecology

## 2017-04-05 ENCOUNTER — Ambulatory Visit: Payer: PPO | Admitting: Obstetrics and Gynecology

## 2017-04-05 NOTE — Telephone Encounter (Signed)
Patient scheduled for tomorrow 04/06/17 @ 12:45pm

## 2017-04-05 NOTE — Progress Notes (Deleted)
66 y.o. G63P0012 Married Caucasian female here for annual exam.    PCP:     Patient's last menstrual period was 01/10/2003.       Sexually active: {yes no:314532}  The current method of family planning is post menopausal status.    Exercising: {yes no:314532}  {types:19826} Smoker:  {YES NO:22349}  Health Maintenance: Pap:   09-26-15 neg HPV HR neg History of abnormal Pap:  {YES NO:22349} MMG:  9/17 left breast density c birads.  1:neg, 10/17 bilateral breast mri & left breast neg, no biopsy needed f/u 53mths. See epic Colonoscopy:  2015 polyp with Dr. Fredia Beets due 2020 BMD:   12/02/14  Result  Osteopenia done with Dr. Jana Hakim TDaP: Up to date with PCP Gardasil:   no HIV: Hep C: Screening Labs:  Hb today: ***, Urine today: ***   reports that she has never smoked. She has never used smokeless tobacco. She reports that she does not drink alcohol or use drugs.  Past Medical History:  Diagnosis Date  . Allergy   . Anxiety   . Bilateral ovarian cysts    Do yearly ultrasound.  . Cancer ([place]) rt breast   2004/ chemo/ radiation  . Cellulitis and abscess of upper arm and forearm   . Family history of breast cancer   . Family history of rectal cancer   . GERD (gastroesophageal reflux disease)   . H/O Helicobacter infection 2017  . History of right mastectomy 2004   chemo/ radiation  . Hyperlipidemia   . Hypertension   . Insomnia   . Left bundle branch block 2016  . Migraines   . PONV (postoperative nausea and vomiting)    cannot take zofran  . Postmastectomy lymphedema rt side  . Sentinel node     Past Surgical History:  Procedure Laterality Date  . CARPAL TUNNEL RELEASE    . CATARACT EXTRACTION Left 07/12/2013  . MANDIBLE SURGERY  1991  . NASAL SINUS SURGERY  1990/2001  . ORIF FINGER FRACTURE  02/14/2012   Procedure: OPEN REDUCTION INTERNAL FIXATION (ORIF) METACARPAL (FINGER) FRACTURE;  Surgeon: Wynonia Sours, MD;  Location: [place];  Service:  Orthopedics;  Laterality: Right;  RIGHT FIFTH   . ORIF METACARPAL FRACTURE  8/13   rt   . PORT-A-CATH REMOVAL     in and now out  . REPAIR EXTENSOR TENDON Left 05/30/2013   Procedure: RELEASE TRANSPOSITION EXTENSOR POLLICUS LONGUS LEFT WRIST;  Surgeon: Wynonia Sours, MD;  Location: [place];  Service: Orthopedics;  Laterality: Left;  . rt mastectomy  2004   lymph nodes-7-axillary node dissection  . SHOULDER ARTHROSCOPY WITH ROTATOR CUFF REPAIR AND SUBACROMIAL DECOMPRESSION Left 11/22/2013   Procedure: LEFT SHOULDER ARTHROSCOPY WITH SUBACROMIAL DECOMPRESSION DISTAL CLAVICLE RESECTION REPAIR  ROTATOR CUFF AS NEEDED ;  Surgeon: Cammie Sickle., MD;  Location: [place];  Service: Orthopedics;  Laterality: Left;    Current Outpatient Prescriptions  Medication Sig Dispense Refill  . ALPRAZOLAM XR 3 MG 24 hr tablet Take 3 mg by mouth at bedtime.    . BELSOMRA 15 MG TABS Take 1 tablet by mouth at bedtime.    . Cholecalciferol (VITAMIN D) 2000 UNITS CAPS Take 1 capsule by mouth daily.     Marland Kitchen eletriptan (RELPAX) 40 MG tablet Take 40 mg by mouth as needed for migraine or headache. Reported on 10/20/2015    . furosemide (LASIX) 20 MG tablet Take 1 tablet by mouth daily.    Marland Kitchen  metoprolol succinate (TOPROL-XL) 50 MG 24 hr tablet Take 1 tablet by mouth daily.    . Multiple Vitamins-Minerals (MULTIVITAMINS THER. W/MINERALS) TABS Take 1 tablet by mouth daily.      . polyethylene glycol powder (GLYCOLAX/MIRALAX) powder Take 17 g by mouth daily.    . Probiotic Product (PROBIOTIC DAILY PO) Take 1 tablet by mouth daily.     . promethazine (PHENERGAN) 25 MG suppository Place 1 suppository (25 mg total) rectally every 6 (six) hours as needed for nausea or vomiting. 10 each 0   No current facility-administered medications for this visit.     Family History  Problem Relation Age of Onset  . Heart disease Father   . Heart disease Mother   . Hypertension Mother   . Rectal  cancer Mother        dx in her early 67s  . Colon polyps Mother   . Breast cancer Maternal Aunt        dx in her early 59s  . Heart disease Paternal Uncle   . Heart disease Paternal Grandmother   . Heart disease Paternal Grandfather   . Diabetes Brother   . Breast cancer Maternal Grandmother        dx in her 19s  . Breast cancer Cousin        dx in early 43s  . Breast cancer Cousin 32  . Hepatitis C Son   . Hypertension Son   . Post-traumatic stress disorder Son     ROS:  Pertinent items are noted in HPI.  Otherwise, a comprehensive ROS was negative.  Exam:   LMP 01/10/2003     General appearance: alert, cooperative and appears stated age Head: Normocephalic, without obvious abnormality, atraumatic Neck: no adenopathy, supple, symmetrical, trachea midline and thyroid normal to inspection and palpation Lungs: clear to auscultation bilaterally Breasts: normal appearance, no masses or tenderness, No nipple retraction or dimpling, No nipple discharge or bleeding, No axillary or supraclavicular adenopathy Heart: regular rate and rhythm Abdomen: soft, non-tender; no masses, no organomegaly Extremities: extremities normal, atraumatic, no cyanosis or edema Skin: Skin color, texture, turgor normal. No rashes or lesions Lymph nodes: Cervical, supraclavicular, and axillary nodes normal. No abnormal inguinal nodes palpated Neurologic: Grossly normal  Pelvic: External genitalia:  no lesions              Urethra:  normal appearing urethra with no masses, tenderness or lesions              Bartholins and Skenes: normal                 Vagina: normal appearing vagina with normal color and discharge, no lesions              Cervix: no lesions              Pap taken: {yes no:314532} Bimanual Exam:  Uterus:  normal size, contour, position, consistency, mobility, non-tender              Adnexa: no mass, fullness, tenderness              Rectal exam: {yes no:314532}.  Confirms.               Anus:  normal sphincter tone, no lesions  Chaperone was present for exam.  Assessment:   Well woman visit with normal exam.   Plan: Mammogram screening discussed. Recommended self breast awareness. Pap and HR HPV as above. Guidelines for Calcium, Vitamin D, regular  exercise program including cardiovascular and weight bearing exercise.   Follow up annually and prn.   Additional counseling given.  {yes [id number]. _______ minutes face to face time of which over 50% was spent in counseling.    After visit summary provided.

## 2017-04-05 NOTE — Telephone Encounter (Signed)
Thanks for the update. Please keep the patient in annual exam recall.

## 2017-04-05 NOTE — Telephone Encounter (Signed)
Patient called at 1:36pm stating she would be late for her 1:30pm appointment. Patient stated she was over 20 minutes away. I told patient she would need to reschedule and offered her two available appointments later this week. Patient said she would call to reschedule when she returned home.

## 2017-04-06 ENCOUNTER — Ambulatory Visit (INDEPENDENT_AMBULATORY_CARE_PROVIDER_SITE_OTHER): Payer: PPO | Admitting: Obstetrics and Gynecology

## 2017-04-06 ENCOUNTER — Telehealth: Payer: Self-pay | Admitting: Oncology

## 2017-04-06 ENCOUNTER — Encounter: Payer: Self-pay | Admitting: Obstetrics and Gynecology

## 2017-04-06 VITALS — BP 144/88 | HR 70 | Resp 18 | Ht 61.5 in | Wt 144.0 lb

## 2017-04-06 DIAGNOSIS — N83202 Unspecified ovarian cyst, left side: Secondary | ICD-10-CM

## 2017-04-06 DIAGNOSIS — Z01419 Encounter for gynecological examination (general) (routine) without abnormal findings: Secondary | ICD-10-CM | POA: Diagnosis not present

## 2017-04-06 DIAGNOSIS — N83201 Unspecified ovarian cyst, right side: Secondary | ICD-10-CM | POA: Diagnosis not present

## 2017-04-06 NOTE — Progress Notes (Signed)
66 y.o. [id number] Married Caucasian female here for annual exam.    Leaking urine.  No dysuria.  Wearing pads. Taking Lasix.  Wants to just do Kegels.   Some right sided pain.  No pain medication use.  No vaginal bleeding.   Husband now has health care power of attorney for the patient, and this will be scanned into EPIC.  PCP:  Redmond School, MD   Patient's last menstrual period was 01/10/2003.           Sexually active: Yes.   female The current method of family planning is post menopausal status.    Exercising: Yes. Treadmill Smoker:  no Health Maintenance: Pap: 09-26-15 Neg:Neg HR HPV, 08-01-12 Neg History of abnormal Pap:  Yes, hx cryotherapy to cervix in her 30s MMG: SEE EPIC.  Screening mammogram 04/05/16 - BIRADS 1, cat C density.  Right breast absent.   Breast MRI 05/06/16 - no biopsy of left breast done as lesion was not longer visualized.  Due for follow up mammogram of breast in April 2018 and follow up with Dr. Jana Hakim.  Colonoscopy: 2015 polyp with Dr. Fredia Beets due 2020 BMD:   12-02-14  Result Osteopenia. Dr.Magrinat  was following. TDaP:  PCP Gardasil:   no HIV:Neg Hep C:Neg Screening Labs:  Hb today: PCP, Urine today: not done   reports that she has never smoked. She has never used smokeless tobacco. She reports that she does not drink alcohol or use drugs.  Past Medical History:  Diagnosis Date  . Allergy   . Anxiety   . Bilateral ovarian cysts    Do yearly ultrasound.  . Cancer ([place]) rt breast   2004/ chemo/ radiation  . Cellulitis and abscess of upper arm and forearm   . Family history of breast cancer   . Family history of rectal cancer   . GERD (gastroesophageal reflux disease)   . H/O Helicobacter infection 2017  . History of right mastectomy 2004   chemo/ radiation  . Hyperlipidemia   . Hypertension   . Insomnia   . Left bundle branch block 2016  . Migraines   . PONV (postoperative nausea and vomiting)    cannot take zofran  .  Postmastectomy lymphedema rt side  . Sentinel node     Past Surgical History:  Procedure Laterality Date  . CARPAL TUNNEL RELEASE    . CATARACT EXTRACTION Left 07/12/2013  . MANDIBLE SURGERY  1991  . NASAL SINUS SURGERY  1990/2001  . ORIF FINGER FRACTURE  02/14/2012   Procedure: OPEN REDUCTION INTERNAL FIXATION (ORIF) METACARPAL (FINGER) FRACTURE;  Surgeon: Wynonia Sours, MD;  Location: [facility];  Service: Orthopedics;  Laterality: Right;  RIGHT FIFTH   . ORIF METACARPAL FRACTURE  8/13   rt   . PORT-A-CATH REMOVAL     in and now out  . REPAIR EXTENSOR TENDON Left 05/30/2013   Procedure: RELEASE TRANSPOSITION EXTENSOR POLLICUS LONGUS LEFT WRIST;  Surgeon: Wynonia Sours, MD;  Location: [facility];  Service: Orthopedics;  Laterality: Left;  . rt mastectomy  2004   lymph nodes-7-axillary node dissection  . SHOULDER ARTHROSCOPY WITH ROTATOR CUFF REPAIR AND SUBACROMIAL DECOMPRESSION Left 11/22/2013   Procedure: LEFT SHOULDER ARTHROSCOPY WITH SUBACROMIAL DECOMPRESSION DISTAL CLAVICLE RESECTION REPAIR  ROTATOR CUFF AS NEEDED ;  Surgeon: Cammie Sickle., MD;  Location: [facility];  Service: Orthopedics;  Laterality: Left;    Current Outpatient Prescriptions  Medication Sig Dispense Refill  . ALPRAZOLAM XR  3 MG 24 hr tablet Take 3 mg by mouth at bedtime.    . BELSOMRA 15 MG TABS Take 1 tablet by mouth at bedtime.    . Cholecalciferol (VITAMIN D) 2000 UNITS CAPS Take 1 capsule by mouth daily.     Marland Kitchen eletriptan (RELPAX) 40 MG tablet Take 40 mg by mouth as needed for migraine or headache. Reported on 10/20/2015    . furosemide (LASIX) 20 MG tablet Take 1 tablet by mouth daily.    . metoprolol succinate (TOPROL-XL) 50 MG 24 hr tablet Take 1 tablet by mouth daily.    . Multiple Vitamins-Minerals (MULTIVITAMINS THER. W/MINERALS) TABS Take 1 tablet by mouth daily.      . polyethylene glycol powder (GLYCOLAX/MIRALAX) powder Take 17 g by mouth daily.    .  potassium chloride SA (K-DUR,KLOR-CON) 20 MEQ tablet Take 1 tablet by mouth as needed.    . Probiotic Product (PROBIOTIC DAILY PO) Take 1 tablet by mouth daily.     . promethazine (PHENERGAN) 25 MG suppository Place 1 suppository (25 mg total) rectally every 6 (six) hours as needed for nausea or vomiting. 10 each 0  . ursodiol (ACTIGALL) 300 MG capsule Take 1 capsule by mouth 2 (two) times daily.     No current facility-administered medications for this visit.     Family History  Problem Relation Age of Onset  . Heart disease Father   . Heart disease Mother   . Hypertension Mother   . Rectal cancer Mother        dx in her early 44s  . Colon polyps Mother   . Breast cancer Maternal Aunt        dx in her early 55s  . Heart disease Paternal Uncle   . Heart disease Paternal Grandmother   . Heart disease Paternal Grandfather   . Diabetes Brother   . Breast cancer Maternal Grandmother        dx in her 61s  . Breast cancer Cousin        dx in early 41s  . Breast cancer Cousin 62  . Hepatitis C Son   . Hypertension Son   . Post-traumatic stress disorder Son     ROS:  Pertinent items are noted in HPI.  Otherwise, a comprehensive ROS was negative.  Exam:   BP (!) 144/88 (BP Location: Left Arm, Patient Position: Sitting, Cuff Size: Large)   Pulse 70   Resp 18   Ht 5' 1.5" (1.562 m)   Wt 144 lb (65.3 kg)   LMP 01/10/2003   BMI 26.77 kg/m     General appearance: alert, cooperative and appears stated age Head: Normocephalic, without obvious abnormality, atraumatic Neck: no adenopathy, supple, symmetrical, trachea midline and thyroid normal to inspection and palpation Lungs: clear to auscultation bilaterally Breasts: right breast absent.  Left breast with scar, no masses or tenderness, No nipple retraction or dimpling, No nipple discharge or bleeding, No axillary or supraclavicular adenopathy Heart: regular rate and rhythm Abdomen: soft, non-tender; no masses, no  organomegaly Extremities: extremities normal, atraumatic, no cyanosis or edema Skin: Skin color, texture, turgor normal. No rashes or lesions Lymph nodes: Cervical, supraclavicular, and axillary nodes normal. No abnormal inguinal nodes palpated Neurologic: Grossly normal  Pelvic: External genitalia:  no lesions              Urethra:  normal appearing urethra with no masses, tenderness or lesions              Bartholins  and Skenes: normal                 Vagina: normal appearing vagina with normal color and discharge, no lesions              Cervix: no lesions              Pap taken: No. Bimanual Exam:  Uterus:  normal size, contour, position, consistency, mobility, non-tender              Adnexa: no mass, fullness, tenderness              Rectal exam: Yes.  .  Confirms.              Anus:  normal sphincter tone, no lesions  Chaperone was present for exam.  Assessment:   Well woman visit with normal exam. Bilateral ovarian cysts.  Hx right breast cancer.  Overdue for follow up with Dr. Jana Hakim and left breast MRI.  Urinary incontinence.   Plan: Mammogram screening discussed. Recommended self breast awareness. Pap and HR HPV as above. Guidelines for Calcium, Vitamin D, regular exercise program including cardiovascular and weight bearing exercise. Kegel's. Will schedule follow up with Dr. Jana Hakim.  Return for pelvic US.  Follow up annually and prn.   After visit summary provided.

## 2017-04-06 NOTE — Progress Notes (Signed)
Patient scheduled while in office, husband present. Scheduled for PUS on 04/28/17 at 11am, with consult to follow with Dr. Quincy Simmonds at 11:30am. Patient declined earlier appointments offered on 9/27,10/4 and 10/11.   Spoke with Ebony Hail at Dr. Virgie Dad office. Patient scheduled for OV on 05/06/17 at 3pm for f/u.   Patient and spouse verbalize understanding and are agreeable to dates and times.

## 2017-04-06 NOTE — Telephone Encounter (Signed)
Spoke with women's and rescheduled cancelled appt with Dr.Magrinat. Spoke with patient as well.

## 2017-04-06 NOTE — Patient Instructions (Addendum)
EXERCISE AND DIET:  We recommended that you start or continue a regular exercise program for good health. Regular exercise means any activity that makes your heart beat faster and makes you sweat.  We recommend exercising at least 30 minutes per day at least 3 days a week, preferably 4 or 5.  We also recommend a diet low in fat and sugar.  Inactivity, poor dietary choices and obesity can cause diabetes, heart attack, stroke, and kidney damage, among others.    ALCOHOL AND SMOKING:  Women should limit their alcohol intake to no more than 7 drinks/beers/glasses of wine (combined, not each!) per week. Moderation of alcohol intake to this level decreases your risk of breast cancer and liver damage. And of course, no recreational drugs are part of a healthy lifestyle.  And absolutely no smoking or even second hand smoke. Most people know smoking can cause heart and lung diseases, but did you know it also contributes to weakening of your bones? Aging of your skin?  Yellowing of your teeth and nails?  CALCIUM AND VITAMIN D:  Adequate intake of calcium and Vitamin D are recommended.  The recommendations for exact amounts of these supplements seem to change often, but generally speaking 600 mg of calcium (either carbonate or citrate) and 800 units of Vitamin D per day seems prudent. Certain women may benefit from higher intake of Vitamin D.  If you are among these women, your doctor will have told you during your visit.    PAP SMEARS:  Pap smears, to check for cervical cancer or precancers,  have traditionally been done yearly, although recent scientific advances have shown that most women can have pap smears less often.  However, every woman still should have a physical exam from her gynecologist every year. It will include a breast check, inspection of the vulva and vagina to check for abnormal growths or skin changes, a visual exam of the cervix, and then an exam to evaluate the size and shape of the uterus and  ovaries.  And after 66 years of age, a rectal exam is indicated to check for rectal cancers. We will also provide age appropriate advice regarding health maintenance, like when you should have certain vaccines, screening for sexually transmitted diseases, bone density testing, colonoscopy, mammograms, etc.   MAMMOGRAMS:  All women over 40 years old should have a yearly mammogram. Many facilities now offer a "3D" mammogram, which may cost around $50 extra out of pocket. If possible,  we recommend you accept the option to have the 3D mammogram performed.  It both reduces the number of women who will be called back for extra views which then turn out to be normal, and it is better than the routine mammogram at detecting truly abnormal areas.    COLONOSCOPY:  Colonoscopy to screen for colon cancer is recommended for all women at age 50.  We know, you hate the idea of the prep.  We agree, BUT, having colon cancer and not knowing it is worse!!  Colon cancer so often starts as a polyp that can be seen and removed at colonscopy, which can quite literally save your life!  And if your first colonoscopy is normal and you have no family history of colon cancer, most women don't have to have it again for 10 years.  Once every ten years, you can do something that may end up saving your life, right?  We will be happy to help you get it scheduled when you are ready.    Be sure to check your insurance coverage so you understand how much it will cost.  It may be covered as a preventative service at no cost, but you should check your particular policy.      Kegel Exercises Kegel exercises help strengthen the muscles that support the rectum, vagina, small intestine, bladder, and uterus. Doing Kegel exercises can help:  Improve bladder and bowel control.  Improve sexual response.  Reduce problems and discomfort during pregnancy.  Kegel exercises involve squeezing your pelvic floor muscles, which are the same muscles you  squeeze when you try to stop the flow of urine. The exercises can be done while sitting, standing, or lying down, but it is best to vary your position. Phase 1 exercises 1. Squeeze your pelvic floor muscles tight. You should feel a tight lift in your rectal area. If you are a female, you should also feel a tightness in your vaginal area. Keep your stomach, buttocks, and legs relaxed. 2. Hold the muscles tight for up to 10 seconds. 3. Relax your muscles. Repeat this exercise 50 times a day or as many times as told by your health care provider. Continue to do this exercise for at least 4-6 weeks or for as long as told by your health care provider. This information is not intended to replace advice given to you by your health care provider. Make sure you discuss any questions you have with your health care provider. Document Released: 06/14/2012 Document Revised: 02/21/2016 Document Reviewed: 05/18/2015 Elsevier Interactive Patient Education  2018 [employer]vier Inc.  

## 2017-04-07 ENCOUNTER — Telehealth: Payer: Self-pay

## 2017-04-07 ENCOUNTER — Telehealth: Payer: Self-pay | Admitting: Obstetrics and Gynecology

## 2017-04-07 NOTE — Telephone Encounter (Signed)
Returned  call with new appointment date and time. Per patient request 9/27 phone tree

## 2017-04-07 NOTE — Addendum Note (Signed)
Addended by: Lowella Fairy on: 04/07/2017 11:19 AM   Modules accepted: Orders

## 2017-04-07 NOTE — Telephone Encounter (Signed)
Patient's spouse, okay per DPR/POA on file, called to give Dr. Quincy Simmonds the current medications the patient is taking. She was seen yesterday but did not have her medication list with her. Please update the following medications:  1. Furosemide 20 mg one in the AM 2. Alprazolam XR 3 mg one in the PM 3. Triazolm 0.2 mg one in the PM 4. Metoprololsuccer 50 mg in the PM 5. Ursodiol 300 mg 2 times a day  Routing to [place] for updating.

## 2017-04-07 NOTE — Telephone Encounter (Signed)
Updated patient's chart and forward to Dr.Silva. Patient was seen 04-06-17.routing to Dr.Silva

## 2017-04-08 NOTE — Telephone Encounter (Signed)
Thank you for the update.  Encounter closed. 

## 2017-04-12 ENCOUNTER — Ambulatory Visit
Admission: RE | Admit: 2017-04-12 | Discharge: 2017-04-12 | Disposition: A | Discharge status: Home / Self Care | Payer: PPO | Source: Ambulatory Visit | Attending: Gastroenterology | Admitting: Gastroenterology

## 2017-04-12 DIAGNOSIS — R112 Nausea with vomiting, unspecified: Secondary | ICD-10-CM

## 2017-04-12 DIAGNOSIS — K7689 Other specified diseases of liver: Secondary | ICD-10-CM | POA: Diagnosis not present

## 2017-04-13 ENCOUNTER — Telehealth: Payer: Self-pay | Admitting: Obstetrics and Gynecology

## 2017-04-13 NOTE — Telephone Encounter (Signed)
Spoke with patient, reviewed Kegel exercises. Patient still had AVS print out, reviewed AVS instructions dated 04/06/17 with patient, questions answered. Patient will also review with spouse. Total length of call 74min.   Patient verbalizes understanding and is agreeable.   Routing to provider for final review. Patient is agreeable to disposition. Will close encounter.

## 2017-04-13 NOTE — Telephone Encounter (Signed)
Patient called requesting a call back from the nurse. She has questions about exercises to strengthen her pelvic area.

## 2017-04-19 ENCOUNTER — Encounter (HOSPITAL_COMMUNITY)
Admission: RE | Admit: 2017-04-19 | Discharge: 2017-04-19 | Disposition: A | Discharge status: Home / Self Care | Payer: PPO | Source: Ambulatory Visit | Attending: Gastroenterology | Admitting: Gastroenterology

## 2017-04-19 DIAGNOSIS — R112 Nausea with vomiting, unspecified: Secondary | ICD-10-CM | POA: Diagnosis not present

## 2017-04-19 DIAGNOSIS — R1110 Vomiting, unspecified: Secondary | ICD-10-CM | POA: Diagnosis not present

## 2017-04-19 MED ORDER — TECHNETIUM TC 99M SULFUR COLLOID
2.0000 | Freq: Once | INTRAVENOUS | Status: AC | PRN
  Administered 2017-04-19: 2.1 via INTRAVENOUS

## 2017-04-28 ENCOUNTER — Telehealth: Payer: Self-pay | Admitting: Obstetrics and Gynecology

## 2017-04-28 ENCOUNTER — Other Ambulatory Visit: Payer: Self-pay | Admitting: Obstetrics and Gynecology

## 2017-04-28 ENCOUNTER — Other Ambulatory Visit: Payer: Self-pay

## 2017-04-28 NOTE — Telephone Encounter (Signed)
Patient's spouse called and cancelled his wife's ultrasound for today at 20 AM. He said his wife has dementia and she is having a "hard time getting moving today" and there is no way they will make it on time. He requests to reschedule to an appointment later in the day. He was very apologetic and appreciative of the understanding regarding his wife's care.  Routing to provider for Adella Nissen for rescheduling.

## 2017-04-28 NOTE — Telephone Encounter (Signed)
Thank you for the update!

## 2017-04-29 NOTE — Telephone Encounter (Signed)
Patient returning call to reschedule PUS. Patient scheduled for PUS 10/25 at 2pm with consult to follow at 2:30pm with Dr. Quincy Simmonds. Patient verbalizes understanding and is agreeable.   Routing to provider for final review. Patient is agreeable to disposition. Will close encounter.  Cc: Lerry Liner

## 2017-05-03 DIAGNOSIS — H539 Unspecified visual disturbance: Secondary | ICD-10-CM | POA: Diagnosis not present

## 2017-05-03 DIAGNOSIS — F419 Anxiety disorder, unspecified: Secondary | ICD-10-CM | POA: Diagnosis not present

## 2017-05-03 DIAGNOSIS — M5030 Other cervical disc degeneration, unspecified cervical region: Secondary | ICD-10-CM | POA: Diagnosis not present

## 2017-05-03 DIAGNOSIS — E559 Vitamin D deficiency, unspecified: Secondary | ICD-10-CM | POA: Diagnosis not present

## 2017-05-03 DIAGNOSIS — I10 Essential (primary) hypertension: Secondary | ICD-10-CM | POA: Diagnosis not present

## 2017-05-03 DIAGNOSIS — F329 Major depressive disorder, single episode, unspecified: Secondary | ICD-10-CM | POA: Diagnosis not present

## 2017-05-03 DIAGNOSIS — Z1389 Encounter for screening for other disorder: Secondary | ICD-10-CM | POA: Diagnosis not present

## 2017-05-03 DIAGNOSIS — Z6826 Body mass index (BMI) 26.0-26.9, adult: Secondary | ICD-10-CM | POA: Diagnosis not present

## 2017-05-03 DIAGNOSIS — R413 Other amnesia: Secondary | ICD-10-CM | POA: Diagnosis not present

## 2017-05-05 ENCOUNTER — Telehealth: Payer: Self-pay | Admitting: Obstetrics and Gynecology

## 2017-05-05 ENCOUNTER — Other Ambulatory Visit: Payer: Self-pay

## 2017-05-05 ENCOUNTER — Other Ambulatory Visit: Payer: Self-pay | Admitting: Obstetrics and Gynecology

## 2017-05-05 NOTE — Telephone Encounter (Signed)
If patient has difficulty making her next appointment, I would recommend scheduling her through [facility].  They may have more flexibility in dates and times for her appointment.   Cc- Thayer Ohm.

## 2017-05-05 NOTE — Telephone Encounter (Signed)
Spoke with patient, PUS rescheduled to 11/8 at 10:30am, consult with Dr. Quincy Simmonds at [facility]. Patient verbalizes understanding and is agreeable.   Routing to provider for final review. Patient is agreeable to disposition. Will close encounter.

## 2017-05-05 NOTE — Telephone Encounter (Signed)
Patient's husband called at 1:39pm to ask if patient could arrive late around 2:30pm to her ultrasound appointment that is scheduled at 2:00pm today. Advised patient unable to arrive late to scheduled ultrasound appointment. Advised if patient cannot make scheduled appointment time, will need to be rescheduled. Patient agreeable to returned call from triage.

## 2017-05-06 ENCOUNTER — Other Ambulatory Visit: Payer: PPO

## 2017-05-06 ENCOUNTER — Ambulatory Visit: Payer: Self-pay | Admitting: Oncology

## 2017-05-06 DIAGNOSIS — K5901 Slow transit constipation: Secondary | ICD-10-CM | POA: Diagnosis not present

## 2017-05-06 DIAGNOSIS — R112 Nausea with vomiting, unspecified: Secondary | ICD-10-CM | POA: Diagnosis not present

## 2017-05-09 ENCOUNTER — Encounter: Payer: Self-pay | Admitting: Adult Health

## 2017-05-09 ENCOUNTER — Other Ambulatory Visit: Payer: PPO

## 2017-05-09 ENCOUNTER — Ambulatory Visit: Payer: Self-pay | Admitting: Adult Health

## 2017-05-09 ENCOUNTER — Ambulatory Visit (HOSPITAL_BASED_OUTPATIENT_CLINIC_OR_DEPARTMENT_OTHER): Payer: PPO | Admitting: Adult Health

## 2017-05-09 ENCOUNTER — Other Ambulatory Visit (HOSPITAL_BASED_OUTPATIENT_CLINIC_OR_DEPARTMENT_OTHER): Payer: PPO

## 2017-05-09 VITALS — BP 154/60 | HR 64 | Temp 97.7°F | Resp 17 | Ht 61.5 in | Wt 147.1 lb

## 2017-05-09 DIAGNOSIS — Z853 Personal history of malignant neoplasm of breast: Secondary | ICD-10-CM

## 2017-05-09 DIAGNOSIS — Z23 Encounter for immunization: Secondary | ICD-10-CM

## 2017-05-09 DIAGNOSIS — Z1231 Encounter for screening mammogram for malignant neoplasm of breast: Secondary | ICD-10-CM

## 2017-05-09 DIAGNOSIS — C50911 Malignant neoplasm of unspecified site of right female breast: Secondary | ICD-10-CM

## 2017-05-09 DIAGNOSIS — Z170 Estrogen receptor positive status [ER+]: Principal | ICD-10-CM

## 2017-05-09 LAB — COMPREHENSIVE METABOLIC PANEL
ALK PHOS: 92 U/L (ref 40–150)
ALT: 13 U/L (ref 0–55)
AST: 16 U/L (ref 5–34)
Albumin: 3.8 g/dL (ref 3.5–5.0)
Anion Gap: 7 mEq/L (ref 3–11)
BUN: 13.7 mg/dL (ref 7.0–26.0)
CALCIUM: 9.9 mg/dL (ref 8.4–10.4)
CO2: 30 mEq/L — ABNORMAL HIGH (ref 22–29)
Chloride: 100 mEq/L (ref 98–109)
Creatinine: 1 mg/dL (ref 0.6–1.1)
EGFR: >60 mL/min/{1.73_m2} (ref >60)
GLUCOSE: 89 mg/dL (ref 70–140)
POTASSIUM: 5 meq/L (ref 3.5–5.1)
Sodium: 136 mEq/L (ref 136–145)
Total Bilirubin: 0.35 mg/dL (ref 0.20–1.20)
Total Protein: 7.1 g/dL (ref 6.4–8.3)

## 2017-05-09 LAB — CBC WITH DIFFERENTIAL/PLATELET
BASO%: 0.8 % (ref 0.0–2.0)
BASOS ABS: 0.1 10*3/uL (ref 0.0–0.1)
EOS%: 3.8 % (ref 0.0–7.0)
Eosinophils Absolute: 0.3 10*3/uL (ref 0.0–0.5)
HEMATOCRIT: 41.7 % (ref 34.8–46.6)
HGB: 13.9 g/dL (ref 11.6–15.9)
LYMPH#: 2.1 10*3/uL (ref 0.9–3.3)
LYMPH%: 27.5 % (ref 14.0–49.7)
MCH: 29.4 pg (ref 25.1–34.0)
MCHC: 33.4 g/dL (ref 31.5–36.0)
MCV: 87.9 fL (ref 79.5–101.0)
MONO#: 0.6 10*3/uL (ref 0.1–0.9)
MONO%: 7.6 % (ref 0.0–14.0)
NEUT#: 4.6 10*3/uL (ref 1.5–6.5)
NEUT%: 60.3 % (ref 38.4–76.8)
PLATELETS: 274 10*3/uL (ref 145–400)
RBC: 4.74 10*6/uL (ref 3.70–5.45)
RDW: 14.2 % (ref 11.2–14.5)
WBC: 7.6 10*3/uL (ref 3.9–10.3)

## 2017-05-09 MED ORDER — INFLUENZA VAC SPLIT QUAD 0.5 ML IM SUSY
0.5000 mL | PREFILLED_SYRINGE | Freq: Once | INTRAMUSCULAR | Status: DC
  Filled 2017-05-09: qty 0.5

## 2017-05-09 NOTE — Progress Notes (Signed)
ID: Kylie Tucker   DOB: 1950-07-13  MR#: [id number]  CSN#:[id number]  PCP: Redmond School, MD GYN: Rolly Pancake SU:  OTHER MD: Daryll Brod  CHIEF COMPLAINT: right breast cancer  CURRENT TREATMENT: observation  BREAST CANCER HISTORY: From the original intake note:  The patient palpated a lump in the lower outer quadrant of the right breast.  Mammography and ultrasound were performed on 08-31-02.  Mammogram was unremarkable, just showing dense breast tissue.  Ultrasound showed a single 9 mm. lesion at the 9:00 position, 5 cm. from the nipple, as well as several adjacent simple cysts.  Core biopsy of the lesion confirmed an invasive mammary carcinoma.  She also had bilateral breast MRI performed on 09-10-02, and this showed multiple enhancing parenchymal nodules bilaterally.  On 09-17-02, Dr. Alphonsa Overall performed an excisional biopsy of the right breast mass as well as sentinel lymph node biopsy followed by completion axillary dissection.  The lumpectomy specimen contained multifocal invasive mammary carcinoma, at least three separate foci, measuring 1.2, 0.9 and 0.6 cm.  Invasive tumor came within 0.5 mm. of the caudal margin and 1 mm. of the cephalic margin.  The excisional biopsy specimen grossly measured 8.8 x 4.8 x 3.4 cm.  The tumor was tubulolobular.  The tumor was associated with lymphovascular invasion.  A total of two out of three sentinel lymph nodes were involved with metastatic adenocarcinoma, and from axillary dissection, one of four lymph nodes were involved, none within the extracapsular extension.  However, there was also an intramammary lymph node involved, for a total of four out of eight positive lymph nodes.  The tumor was ER positive at 47% and PR positive at 17%, had an elevation proliferation marker of 44%, and was HER-2/neu 3+ positive. Her subsequent history is as detailed below  INTERVAL HISTORY: Kylie Tucker returns for followup of her breast cancer.  She had a bacterial  infection in her stomach that has resolved, and has some sleeping issues she is going to see a neurologist about.  Otherwise she is doing well and is eating a diet rich in fruits and vegetables.  She does admit she could exercise more, but otherwise is up to date with her heath maintenance.   REVIEW OF SYSTEMS: A detailed ROS was conducted and is non contributory today.   PAST MEDICAL HISTORY: Past Medical History:  Diagnosis Date  . Allergy   . Anxiety   . Bilateral ovarian cysts    Do yearly ultrasound.  . Cancer ([place]) rt breast   2004/ chemo/ radiation  . Cellulitis and abscess of upper arm and forearm   . Family history of breast cancer   . Family history of rectal cancer   . GERD (gastroesophageal reflux disease)   . H/O Helicobacter infection 2017  . History of right mastectomy 2004   chemo/ radiation  . Hyperlipidemia   . Hypertension   . Insomnia   . Left bundle branch block 2016  . Migraines   . PONV (postoperative nausea and vomiting)    cannot take zofran  . Postmastectomy lymphedema rt side  . Sentinel node   She had some type of RT (?orthovoltage vs Cobalt) at [facility] in the late 70's to her lt. lower ribs to treat traumatic injury there, approx. 12 txs. No records available, no tattoos were left. Otherwise healthy except for some GE reflux.  She has undergone excision of breast cysts on the right in the 1980s and on the left in 2002.    PAST SURGICAL  HISTORY: Past Surgical History:  Procedure Laterality Date  . CARPAL TUNNEL RELEASE    . CATARACT EXTRACTION Left 07/12/2013  . MANDIBLE SURGERY  1991  . NASAL SINUS SURGERY  1990/2001  . ORIF FINGER FRACTURE  02/14/2012   Procedure: OPEN REDUCTION INTERNAL FIXATION (ORIF) METACARPAL (FINGER) FRACTURE;  Surgeon: Wynonia Sours, MD;  Location: [place];  Service: Orthopedics;  Laterality: Right;  RIGHT FIFTH   . ORIF METACARPAL FRACTURE  8/13   rt   . PORT-A-CATH REMOVAL     in and now out  . REPAIR  EXTENSOR TENDON Left 05/30/2013   Procedure: RELEASE TRANSPOSITION EXTENSOR POLLICUS LONGUS LEFT WRIST;  Surgeon: Wynonia Sours, MD;  Location: [place];  Service: Orthopedics;  Laterality: Left;  . rt mastectomy  2004   lymph nodes-7-axillary node dissection  . SHOULDER ARTHROSCOPY WITH ROTATOR CUFF REPAIR AND SUBACROMIAL DECOMPRESSION Left 11/22/2013   Procedure: LEFT SHOULDER ARTHROSCOPY WITH SUBACROMIAL DECOMPRESSION DISTAL CLAVICLE RESECTION REPAIR  ROTATOR CUFF AS NEEDED ;  Surgeon: Cammie Sickle., MD;  Location: [place];  Service: Orthopedics;  Laterality: Left;    FAMILY HISTORY Family History  Problem Relation Age of Onset  . Heart disease Father   . Heart disease Mother   . Hypertension Mother   . Rectal cancer Mother        dx in her early 5s  . Colon polyps Mother   . Breast cancer Maternal Aunt        dx in her early 10s  . Heart disease Paternal Uncle   . Heart disease Paternal Grandmother   . Heart disease Paternal Grandfather   . Diabetes Brother   . Breast cancer Maternal Grandmother        dx in her 43s  . Breast cancer Cousin        dx in early 17s  . Breast cancer Cousin 73  . Hepatitis C Son   . Hypertension Son   . Post-traumatic stress disorder Son   Her mother had rectal cancer, negative for breast or ovarian cancer.  GYNECOLOGIC HISTORY: GX P2   SOCIAL HISTORY: Married, has two children, worked at [employer]  in [place], but retired June 2015   [place]: Husband is healthcare power of attorney  HEALTH MAINTENANCE: Social History  Substance Use Topics  . Smoking status: Never Smoker  . Smokeless tobacco: Never Used  . Alcohol use No     Colonoscopy:  PAP:  Bone density:  Lipid panel:  Allergies  Allergen Reactions  . Acetaminophen Nausea Only    Other reaction(s): Other (See Comments)  . Amoxicillin-Pot Clavulanate Nausea Only    flushing  . Azithromycin     Heart  palpitations   . Cefuroxime Axetil Nausea Only  . Cephalexin Nausea Only  . Clonazepam     Heart had a "weird feeling" and difficulty breathing  . Hydrocodone-Acetaminophen Hives  . Hydrocodone-Acetaminophen     Other reaction(s): GI Upset (intolerance)  . Levofloxacin Nausea Only  . Morphine And Related Other (See Comments)    Hallucinations  . Ondansetron Nausea And Vomiting  . Propoxyphene N-Acetaminophen Nausea And Vomiting  . Rosuvastatin     REACTION: myalgia  . Sulfonamide Derivatives Itching  . Tramadol Nausea And Vomiting  . Trazodone And Nefazodone Nausea Only    Felt like "I was burning all over" -patient  . Vioxx [Rofecoxib] Nausea And Vomiting  . Zofran [Ondansetron Hcl] Nausea And Vomiting  .  Clarithromycin Nausea Only and Anxiety  . Codeine Itching and Rash  . Seroquel [Quetiapine Fumarate] Palpitations    Heart racing   . Sulfamethoxazole Rash    Current Outpatient Prescriptions  Medication Sig Dispense Refill  . ALPRAZOLAM XR 3 MG 24 hr tablet Take 3 mg by mouth at bedtime.    . furosemide (LASIX) 20 MG tablet Take 1 tablet by mouth daily.    . metoprolol succinate (TOPROL-XL) 50 MG 24 hr tablet Take 1 tablet by mouth daily.    . triazolam (HALCION) 0.25 MG tablet Take 0.25 mg by mouth at bedtime as needed for sleep.    . ursodiol (ACTIGALL) 300 MG capsule Take 1 capsule by mouth 2 (two) times daily.     Current Facility-Administered Medications  Medication Dose Route Frequency Provider Last Rate Last Dose  . Influenza vac split quadrivalent PF (FLUARIX) injection 0.5 mL  0.5 mL Intramuscular Once Gardenia Phlegm, NP        OBJECTIVE:  Vitals:   05/09/17 1308  BP: (!) 154/60  Pulse: 64  Resp: 17  Temp: 97.7 F (36.5 C)  SpO2: 100%     Body mass index is 27.34 kg/m.    ECOG FS: 1  GENERAL: Patient is a well appearing female in no acute distress HEENT:  Sclerae anicteric.  Oropharynx clear and moist. No ulcerations or evidence of  oropharyngeal candidiasis. Neck is supple.  NODES:  No cervical, supraclavicular, or axillary lymphadenopathy palpated.  BREAST EXAM: s/p right mastectomy, no nodularity, or masses noted, left breast s/p lumpectomy no nodularity, no skin changes, no masses, nodules.  Benign bilateral breast exam. LUNGS:  Clear to auscultation bilaterally.  No wheezes or rhonchi. HEART:  Regular rate and rhythm. No murmur appreciated. ABDOMEN:  Soft, nontender.  Positive, normoactive bowel sounds. No organomegaly palpated. MSK:  No focal spinal tenderness to palpation. Full range of motion bilaterally in the upper extremities. EXTREMITIES:  No peripheral edema.   SKIN:  Clear with no obvious rashes or skin changes. No nail dyscrasia. NEURO:  Nonfocal. Well oriented.  Appropriate affect.   LAB RESULTS: Lab Results  Component Value Date   WBC 7.6 05/09/2017   NEUTROABS 4.6 05/09/2017   HGB 13.9 05/09/2017   HCT 41.7 05/09/2017   MCV 87.9 05/09/2017   PLT 274 05/09/2017      Chemistry      Component Value Date/Time   NA 124 (L) 11/28/2016 1342   NA 140 10/20/2015 1403   K 3.9 11/28/2016 1342   K 4.0 10/20/2015 1403   CL 90 (L) 11/28/2016 1342   CL 103 12/08/2012 0810   CO2 21 (L) 11/28/2016 1342   CO2 30 (H) 10/20/2015 1403   BUN 13 11/28/2016 1342   BUN 11.6 10/20/2015 1403   CREATININE 0.82 11/28/2016 1342   CREATININE 0.87 12/25/2015 0855   CREATININE 0.9 10/20/2015 1403      Component Value Date/Time   CALCIUM 9.2 11/28/2016 1342   CALCIUM 9.7 10/20/2015 1403   ALKPHOS 71 11/28/2016 1342   ALKPHOS 60 10/20/2015 1403   AST 32 11/28/2016 1342   AST 21 10/20/2015 1403   ALT 25 11/28/2016 1342   ALT 16 10/20/2015 1403   BILITOT 0.5 11/28/2016 1342   BILITOT 0.50 10/20/2015 1403       Lab Results  Component Value Date   LABCA2 26 02/16/2011    No components found for: [id number]  No results for input(s): INR in the last 168 hours.  Urinalysis  Component Value Date/Time    COLORURINE STRAW (A) 11/28/2016 1510   APPEARANCEUR CLEAR 11/28/2016 1510   LABSPEC 1.012 11/28/2016 1510   PHURINE 8.0 11/28/2016 1510   GLUCOSEU 50 (A) 11/28/2016 1510   HGBUR NEGATIVE 11/28/2016 1510   BILIRUBINUR NEGATIVE 11/28/2016 1510   BILIRUBINUR - 09/10/2014 1130   KETONESUR 5 (A) 11/28/2016 1510   PROTEINUR NEGATIVE 11/28/2016 1510   UROBILINOGEN negative 09/10/2014 1130   UROBILINOGEN 0.2 06/29/2010 1418   NITRITE NEGATIVE 11/28/2016 1510   LEUKOCYTESUR NEGATIVE 11/28/2016 1510    STUDIES: Nm Gastric Emptying  Result Date: 04/19/2017 CLINICAL DATA:  Nausea and vomiting for 1 year EXAM: NUCLEAR MEDICINE GASTRIC EMPTYING SCAN TECHNIQUE: After oral ingestion of radiolabeled meal, sequential abdominal images were obtained for 4 hours. Percentage of activity emptying the stomach was calculated at 1 hour, 2 hour, and 3 hours. Imaging at 4 hours was not required. RADIOPHARMACEUTICALS:  2.1 mCi Tc-75msulfur colloid in standardized meal COMPARISON:  None FINDINGS: Expected location of the stomach in the left upper quadrant. Ingested meal empties the stomach gradually over the course of the study. 63% emptied at 1 hr ( normal >= 10%) 86% emptied at 2 hr ( normal >= 40%) 94% emptied at 3 hr ( normal >= 70%) N/A emptied at 4 hr ( normal >= 90%) IMPRESSION: Normal gastric emptying study. Electronically Signed   By: MLavonia DanaM.D.   On: 04/19/2017 17:50   UKoreaAbdomen Limited Ruq  Result Date: 04/12/2017 CLINICAL DATA:  One year of nausea EXAM: ULTRASOUND ABDOMEN LIMITED RIGHT UPPER QUADRANT COMPARISON:  Abdominal ultrasound of October 19, 2016 and nuclear medicine hepatobiliary scan of December 16, 2016. FINDINGS: Gallbladder: The gallbladder is adequately distended with no evidence of stones, wall thickening, or pericholecystic fluid. There is no positive sonographic Murphy's sign. Common bile duct: Diameter: 3 mm Liver: The hepatic echotexture is mildly increased diffusely. A right lobe simple  appearing cyst measures 1.3 x 0.9 x 1.4 cm. A left lobe simple appearing cyst measures 1.1 x 1.3 x 1.4 cm. Portal vein is patent on color Doppler imaging with normal direction of blood flow towards the liver. IMPRESSION: Normal appearance of the gallbladder and common bile duct. Simple appearing cysts within the liver which have been previously described. Mildly increased echotexture compatible with fatty infiltrative change. Electronically Signed   By: David  JMartiniqueM.D.   On: 04/12/2017 12:02    ASSESSMENT: 66y.o. 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 She took tamoxifen between December of 2004 and July of 2006 at which point she was switched to anastrozole, completing her 5 years of anastrozole in December of 2011.  PLAN:  DMykiais doing well today.  She has no clinical sign of recurrence.  She did undergo MRI breasts in 04/2016 and she was recommended 6 month follow up which she did not do.  She will go ahead and repeat MRI next available.  She is also due for her mammogram, which I encouraged her to schedule.  I recommended continued healthy diet and increasing her current exercise.    DArronwill return in 1 year for labs and a follow up visit, though she knows that she could "graduate" from observation at any time. She understands and agrees with this plan. She has been encouraged to call  with any issues that might arise before her next visit here.  A total of (30) minutes of face-to-face time was spent with this patient with greater than 50% of that time in counseling and care-coordination.   Scot Dock    05/09/2017

## 2017-05-10 ENCOUNTER — Other Ambulatory Visit: Payer: Self-pay | Admitting: Oncology

## 2017-05-10 ENCOUNTER — Telehealth: Payer: Self-pay | Admitting: Oncology

## 2017-05-10 ENCOUNTER — Other Ambulatory Visit: Payer: Self-pay | Admitting: Adult Health

## 2017-05-10 DIAGNOSIS — Z1231 Encounter for screening mammogram for malignant neoplasm of breast: Secondary | ICD-10-CM

## 2017-05-10 NOTE — Telephone Encounter (Signed)
Called patient regarding year appointment

## 2017-05-19 ENCOUNTER — Other Ambulatory Visit: Payer: Self-pay

## 2017-05-19 ENCOUNTER — Encounter: Payer: Self-pay | Admitting: Obstetrics and Gynecology

## 2017-05-19 ENCOUNTER — Encounter: Payer: Self-pay | Admitting: Gynecologic Oncology

## 2017-05-19 ENCOUNTER — Ambulatory Visit (INDEPENDENT_AMBULATORY_CARE_PROVIDER_SITE_OTHER): Payer: PPO

## 2017-05-19 ENCOUNTER — Ambulatory Visit: Payer: PPO | Admitting: Obstetrics and Gynecology

## 2017-05-19 ENCOUNTER — Telehealth: Payer: Self-pay | Admitting: Gynecologic Oncology

## 2017-05-19 VITALS — BP 124/68 | HR 76 | Resp 16

## 2017-05-19 DIAGNOSIS — R309 Painful micturition, unspecified: Secondary | ICD-10-CM | POA: Diagnosis not present

## 2017-05-19 DIAGNOSIS — N83202 Unspecified ovarian cyst, left side: Secondary | ICD-10-CM

## 2017-05-19 DIAGNOSIS — M8580 Other specified disorders of bone density and structure, unspecified site: Secondary | ICD-10-CM | POA: Diagnosis not present

## 2017-05-19 DIAGNOSIS — N83201 Unspecified ovarian cyst, right side: Secondary | ICD-10-CM

## 2017-05-19 DIAGNOSIS — Z780 Asymptomatic menopausal state: Secondary | ICD-10-CM | POA: Diagnosis not present

## 2017-05-19 LAB — POCT URINALYSIS DIPSTICK
BILIRUBIN UA: NEGATIVE
GLUCOSE UA: NEGATIVE
KETONES UA: NEGATIVE
Leukocytes, UA: NEGATIVE
Nitrite, UA: NEGATIVE
Protein, UA: NEGATIVE
RBC UA: NEGATIVE
Urobilinogen, UA: 0.2 E.U./dL
pH, UA: 5 (ref 5.0–8.0)

## 2017-05-19 NOTE — Patient Instructions (Signed)
Ovarian Cyst An ovarian cyst is a fluid-filled sac that forms on an ovary. The ovaries are small organs that produce eggs in women. Various types of cysts can form on the ovaries. Some may cause symptoms and require treatment. Most ovarian cysts go away on their own, are not cancerous (are benign), and do not cause problems. Common types of ovarian cysts include:  Functional (follicle) cysts. ? Occur during the menstrual cycle, and usually go away with the next menstrual cycle if you do not get pregnant. ? Usually cause no symptoms.  Endometriomas. ? Are cysts that form from the tissue that lines the uterus (endometrium). ? Are sometimes called "chocolate cysts" because they become filled with blood that turns brown. ? Can cause pain in the lower abdomen during intercourse and during your period.  Cystadenoma cysts. ? Develop from cells on the outside surface of the ovary. ? Can get very large and cause lower abdomen pain and pain with intercourse. ? Can cause severe pain if they twist or break open (rupture).  Dermoid cysts. ? Are sometimes found in both ovaries. ? May contain different kinds of body tissue, such as skin, teeth, hair, or cartilage. ? Usually do not cause symptoms unless they get very big.  Theca lutein cysts. ? Occur when too much of a certain hormone (human chorionic gonadotropin) is produced and overstimulates the ovaries to produce an egg. ? Are most common after having procedures used to assist with the conception of a baby (in vitro fertilization).  What are the causes? Ovarian cysts may be caused by:  Ovarian hyperstimulation syndrome. This is a condition that can develop from taking fertility medicines. It causes multiple large ovarian cysts to form.  Polycystic ovarian syndrome (PCOS). This is a common hormonal disorder that can cause ovarian cysts, as well as problems with your period or fertility.  What increases the risk? The following factors may make  you more likely to develop ovarian cysts:  Being overweight or obese.  Taking fertility medicines.  Taking certain forms of hormonal birth control.  Smoking.  What are the signs or symptoms? Many ovarian cysts do not cause symptoms. If symptoms are present, they may include:  Pelvic pain or pressure.  Pain in the lower abdomen.  Pain during sex.  Abdominal swelling.  Abnormal menstrual periods.  Increasing pain with menstrual periods.  How is this diagnosed? These cysts are commonly found during a routine pelvic exam. You may have tests to find out more about the cyst, such as:  Ultrasound.  X-ray of the pelvis.  CT scan.  MRI.  Blood tests.  How is this treated? Many ovarian cysts go away on their own without treatment. Your health care provider may want to check your cyst regularly for 2-3 months to see if it changes. If you are in menopause, it is especially important to have your cyst monitored closely because menopausal women have a higher rate of ovarian cancer. When treatment is needed, it may include:  Medicines to help relieve pain.  A procedure to drain the cyst (aspiration).  Surgery to remove the whole cyst.  Hormone treatment or birth control pills. These methods are sometimes used to help dissolve a cyst.  Follow these instructions at home:  Take over-the-counter and prescription medicines only as told by your health care provider.  Do not drive or use heavy machinery while taking prescription pain medicine.  Get regular pelvic exams and Pap tests as often as told by your health care   provider.  Return to your normal activities as told by your health care provider. Ask your health care provider what activities are safe for you.  Do not use any products that contain nicotine or tobacco, such as cigarettes and e-cigarettes. If you need help quitting, ask your health care provider.  Keep all follow-up visits as told by your health care provider.  This is important. Contact a health care provider if:  Your periods are late, irregular, or painful, or they stop.  You have pelvic pain that does not go away.  You have pressure on your bladder or trouble emptying your bladder completely.  You have pain during sex.  You have any of the following in your abdomen: ? A feeling of fullness. ? Pressure. ? Discomfort. ? Pain that does not go away. ? Swelling.  You feel generally ill.  You become constipated.  You lose your appetite.  You develop severe acne.  You start to have more body hair and facial hair.  You are gaining weight or losing weight without changing your exercise and eating habits.  You think you may be pregnant. Get help right away if:  You have abdominal pain that is severe or gets worse.  You cannot eat or drink without vomiting.  You suddenly develop a fever.  Your menstrual period is much heavier than usual. This information is not intended to replace advice given to you by your health care provider. Make sure you discuss any questions you have with your health care provider. Document Released: 06/28/2005 Document Revised: 01/16/2016 Document Reviewed: 11/30/2015 Elsevier Interactive Patient Education  2018 [employer]  

## 2017-05-19 NOTE — Telephone Encounter (Signed)
Gyn onc appt has been scheduled for the pt to see Dr. Denman George on 12/3 at 1130am. Letter mailed to the pt.

## 2017-05-19 NOTE — Progress Notes (Signed)
Encounter reviewed by Dr. Georgene Kopper Amundson C. Silva.  

## 2017-05-19 NOTE — Progress Notes (Signed)
Patient scheduled while in office, spouse present. Spoke with [name] at [facility], scheduled for BMD on 06/17/17 at 3:30pm. Advised patient and spouse, will need to stop all calcium supplements 48 hours prior to appointment.  Spoke with [name] at [facility], patient scheduled with Dr. Denman George on 12/3 at 11:30am.   Patient and spouse verbalizes understanding and is agreeable.

## 2017-05-19 NOTE — Progress Notes (Signed)
Patient ID: Kylie Tucker, female   DOB: 10-13-1950, 66 y.o.   MRN: [id number] GYNECOLOGY  VISIT   HPI: 66 y.o.   Married  Caucasian  female   [phone] with Patient's last menstrual period was 01/10/2003.   here for pelvic ultrasound for bilateral ovarian cysts.    Husband is present for discussion today.   Cysts followed through serial [place] and have been stable with appearance of multiple follicles in past.   Last [place] was done 04/01/16 in office: Uterus no masses.  EMS 2 mm. Bilateral ovaries with several follicles 10 mm or less. No free fluid.  CA125 on 12/26/15 was 6.  Patient asking for a bone density to be ordered.  ROS: Some urinary frequency and urgency.  Burning with urination. Night time urination. States her PCP has referred her to a kidney specialist but she does not have an appointment yet. Reporting constipation and diarrhea.  Excessive thirst.  Urine dip - negative.   GYNECOLOGIC HISTORY: Patient's last menstrual period was 01/10/2003. Contraception:  Postmenopausal Menopausal hormone therapy:  none Last mammogram:  Screening mammogram 04/05/16 - BIRADS 1, cat C density.  Right breast absent.   Breast MRI 05/06/16 - no biopsy of left breast done as lesion was not longer visualized.  Was for follow up mammogram of breast in April 2018 and follow up with Dr. Jana Hakim. MMG appt. 06-01-17. Last pap smear:  09-26-15 Neg:Neg HR HPV, 08-01-12 Neg History of abnormal Pap:  Yes, hx cryotherapy to cervix in her 72s        OB History    Gravida Para Term Preterm AB Living   3 2     1 2    SAB TAB Ectopic Multiple Live Births   0 1               Patient Active Problem List   Diagnosis Date Noted  . Hyponatremia 11/28/2016  . N&V (nausea and vomiting) 11/28/2016  . Abdominal pain 11/28/2016  . Sleep talking 07/22/2016  . Genetic testing 06/01/2016  . Family history of breast cancer   . Family history of rectal cancer   . Chest pain 09/23/2015  . Abnormal vision  06/13/2015  . Anxiety disorder 06/13/2015  . Cataract cortical, senile 06/13/2015  . Clinical depression 06/13/2015  . Cephalalgia 06/13/2015  . H/O malignant neoplasm of breast 06/13/2015  . 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 Sinus infection 06/13/2015  . Long term current use of systemic steroids 06/13/2015  . Chronic systolic CHF (congestive heart failure), NYHA class 1 ([place]) 05/09/2015  . LBBB (left bundle branch block) 03/26/2015  . Lymphedema of arm 12/13/2013  . Complete rupture of rotator cuff 11/22/2013  . Nipple discharge in female 12/01/2012  . History of breast cancer 11/30/2012  . Epiphora 11/28/2012  . Disorder of endocrine system 04/25/2012  . Ceratitis 04/25/2012  . Blepharitis 04/25/2012  . Breast cancer, right breast ([place])   . FATIGUE 10/01/2009  . 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 Dyspnea 10/01/2009  . HYPERLIPIDEMIA 09/30/2009    Past Medical History:  Diagnosis Date  . Allergy   . Anxiety   . Bilateral ovarian cysts    Do yearly ultrasound.  . Cancer ([place]) rt breast   2004/ chemo/ radiation  . Cellulitis and abscess of upper arm and forearm   . Family history of breast cancer   . Family history of rectal cancer   . GERD (gastroesophageal reflux disease)   . H/O Helicobacter infection 2017  . History  of right mastectomy 2004   chemo/ radiation  . Hyperlipidemia   . Hypertension   . Insomnia   . Left bundle branch block 2016  . Migraines   . PONV (postoperative nausea and vomiting)    cannot take zofran  . Postmastectomy lymphedema rt side  . Sentinel node     Past Surgical History:  Procedure Laterality Date  . CARPAL TUNNEL RELEASE    . CATARACT EXTRACTION Left 07/12/2013  . MANDIBLE SURGERY  1991  . NASAL SINUS SURGERY  1990/2001  . ORIF METACARPAL FRACTURE  8/13   rt   . PORT-A-CATH REMOVAL     in and now out  . rt mastectomy  2004   lymph nodes-7-axillary node dissection    Current Outpatient Medications  Medication Sig Dispense Refill  .  ALPRAZOLAM XR 3 MG 24 hr tablet Take 3 mg by mouth at bedtime.    . furosemide (LASIX) 20 MG tablet Take 40 mg daily by mouth.     . metoprolol succinate (TOPROL-XL) 50 MG 24 hr tablet Take 1 tablet by mouth daily.    . triazolam (HALCION) 0.25 MG tablet Take 0.25 mg by mouth at bedtime as needed for sleep.     No current facility-administered medications for this visit.      ALLERGIES: Acetaminophen; Amoxicillin-pot clavulanate; Azithromycin; Cefuroxime axetil; Cephalexin; Clonazepam; Hydrocodone-acetaminophen; Hydrocodone-acetaminophen; Levofloxacin; Morphine and related; Ondansetron; Propoxyphene n-acetaminophen; Rosuvastatin; Sulfonamide derivatives; Tramadol; Trazodone and nefazodone; Vioxx [rofecoxib]; Zofran [ondansetron hcl]; Clarithromycin; Codeine; Seroquel [quetiapine fumarate]; and Sulfamethoxazole  Family History  Problem Relation Age of Onset  . Heart disease Father   . Heart disease Mother   . Hypertension Mother   . Rectal cancer Mother        dx in her early 57s  . Colon polyps Mother   . Breast cancer Maternal Aunt        dx in her early 52s  . Heart disease Paternal Uncle   . Heart disease Paternal Grandmother   . Heart disease Paternal Grandfather   . Diabetes Brother   . Breast cancer Maternal Grandmother        dx in her 77s  . Breast cancer Cousin        dx in early 77s  . Breast cancer Cousin 44  . Hepatitis C Son   . Hypertension Son   . Post-traumatic stress disorder Son     Social History   Socioeconomic History  . Marital status: Married    Spouse name: Not on file  . Number of children: Not on file  . Years of education: Not on file  . Highest education level: Not on file  Social Needs  . Financial resource strain: Not on file  . Food insecurity - worry: Not on file  . Food insecurity - inability: Not on file  . Transportation needs - medical: Not on file  . Transportation needs - non-medical: Not on file  Occupational History  .  Occupation: retired  Tobacco Use  . Smoking status: Never Smoker  . Smokeless tobacco: Never Used  Substance and Sexual Activity  . Alcohol use: No    Alcohol/week: 0.0 oz  . Drug use: No  . Sexual activity: Yes    Partners: Male    Birth control/protection: Post-menopausal  Other Topics Concern  . Not on file  Social History Narrative  . Not on file    ROS:  Pertinent items are noted in HPI.  PHYSICAL EXAMINATION:    BP  124/68 (BP Location: Left Arm, Patient Position: Sitting, Cuff Size: Normal)   Pulse 76   Resp 16   LMP 01/10/2003     General appearance: alert, cooperative and appears stated age   Pelvic ultrasound: Uterus no masses. EMS 2.38 mm. Right ovary 44 x 34 x 44 mm. Multiple cystic regions and little stroma. Left ovary 38 x 20 x 27 mm.  Multiple cystic regions and little stoma. No free fluid.  ASSESSMENT  Bilateral ovarian cysts.  Theca lutein cysts?  The cysts appear to be changing. Osteopenia. Menopause.  Hx breast cancer. Dysuria.  Neg urine dip.   PLAN  Discussion of ovarian cysts. Referral to Dr. Everitt Amber for evaluation and potential tx of ovarian cysts. We talked about potential laparoscopic route of surgery if BSO/staging is needed. BMD ordered.  Follow up with urology/nephrology as PCP recommending.  Follow up for annual exam and prn.    An After Visit Summary was printed and given to the patient.  ___25___ minutes face to face time of which over 50% was spent in counseling.

## 2017-05-20 DIAGNOSIS — N83201 Unspecified ovarian cyst, right side: Secondary | ICD-10-CM | POA: Insufficient documentation

## 2017-05-20 DIAGNOSIS — N83202 Unspecified ovarian cyst, left side: Principal | ICD-10-CM

## 2017-05-22 ENCOUNTER — Ambulatory Visit (INDEPENDENT_AMBULATORY_CARE_PROVIDER_SITE_OTHER): Payer: PPO

## 2017-05-22 ENCOUNTER — Ambulatory Visit (HOSPITAL_COMMUNITY)
Admission: EM | Admit: 2017-05-22 | Discharge: 2017-05-22 | Disposition: A | Discharge status: Home / Self Care | Payer: PPO | Attending: Family Medicine | Admitting: Family Medicine

## 2017-05-22 ENCOUNTER — Encounter (HOSPITAL_COMMUNITY): Payer: Self-pay | Admitting: Emergency Medicine

## 2017-05-22 DIAGNOSIS — S52502A Unspecified fracture of the lower end of left radius, initial encounter for closed fracture: Secondary | ICD-10-CM

## 2017-05-22 DIAGNOSIS — W010XXA Fall on same level from slipping, tripping and stumbling without subsequent striking against object, initial encounter: Secondary | ICD-10-CM

## 2017-05-22 DIAGNOSIS — S52325A Nondisplaced transverse fracture of shaft of left radius, initial encounter for closed fracture: Secondary | ICD-10-CM | POA: Diagnosis not present

## 2017-05-22 DIAGNOSIS — M25532 Pain in left wrist: Secondary | ICD-10-CM | POA: Diagnosis not present

## 2017-05-22 DIAGNOSIS — W19XXXA Unspecified fall, initial encounter: Secondary | ICD-10-CM

## 2017-05-22 DIAGNOSIS — S52602A Unspecified fracture of lower end of left ulna, initial encounter for closed fracture: Secondary | ICD-10-CM

## 2017-05-22 DIAGNOSIS — S6992XA Unspecified injury of left wrist, hand and finger(s), initial encounter: Secondary | ICD-10-CM | POA: Diagnosis not present

## 2017-05-22 MED ORDER — HYDROCODONE-IBUPROFEN 5-200 MG PO TABS: 1.0000 | ORAL_TABLET | Freq: Three times a day (TID) | ORAL | 0 refills | Status: DC | PRN

## 2017-05-22 NOTE — ED Provider Notes (Signed)
[facility]    CSN: [id number] Arrival date & time: 05/22/17  1636     History   Chief Complaint Chief Complaint  Patient presents with  . Fall    HPI Kylie Tucker is a 66 y.o. female.   HPI  Kylie Tucker is a 66 y.o. female presenting to UC with c/o Left wrist pain that started yesterday after tripping on a curb and falling onto the pavement.  Denies hitting her head or LOC.  Pain is aching and sore, worse with movement of her wrist, 7/10 at worst. She states she cannot take Tylenol or Motrin due to stomach upset. She has not tried anything for her symptoms yet.  She did have carpal tunnel surgery on the same wrist about 8 years ago by Dr. Fredna Dow. She has not had problems with her wrist since then until falling yesterday.    Past Medical History:  Diagnosis Date  . Allergy   . Anxiety   . Bilateral ovarian cysts    Do yearly ultrasound.  . Cancer ([place]) rt breast   2004/ chemo/ radiation  . Cellulitis and abscess of upper arm and forearm   . Family history of breast cancer   . Family history of rectal cancer   . GERD (gastroesophageal reflux disease)   . H/O Helicobacter infection 2017  . History of right mastectomy 2004   chemo/ radiation  . Hyperlipidemia   . Hypertension   . Insomnia   . Left bundle branch block 2016  . Migraines   . PONV (postoperative nausea and vomiting)    cannot take zofran  . Postmastectomy lymphedema rt side  . Sentinel node     Patient Active Problem List   Diagnosis Date Noted  . Bilateral ovarian cysts 05/20/2017  . Hyponatremia 11/28/2016  . N&V (nausea and vomiting) 11/28/2016  . Abdominal pain 11/28/2016  . Sleep talking 07/22/2016  . Genetic testing 06/01/2016  . Family history of breast cancer   . Family history of rectal cancer   . Chest pain 09/23/2015  . Abnormal vision 06/13/2015  . Anxiety disorder 06/13/2015  . Cataract cortical, senile 06/13/2015  . Clinical depression 06/13/2015  .  Cephalalgia 06/13/2015  . H/O malignant neoplasm of breast 06/13/2015  . 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 Sinus infection 06/13/2015  . Long term current use of systemic steroids 06/13/2015  . Chronic systolic CHF (congestive heart failure), NYHA class 1 ([place]) 05/09/2015  . LBBB (left bundle branch block) 03/26/2015  . Lymphedema of arm 12/13/2013  . Complete rupture of rotator cuff 11/22/2013  . Nipple discharge in female 12/01/2012  . History of breast cancer 11/30/2012  . Epiphora 11/28/2012  . Disorder of endocrine system 04/25/2012  . Ceratitis 04/25/2012  . Blepharitis 04/25/2012  . Breast cancer, right breast ([place])   . FATIGUE 10/01/2009  . 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 Dyspnea 10/01/2009  . HYPERLIPIDEMIA 09/30/2009    Past Surgical History:  Procedure Laterality Date  . CARPAL TUNNEL RELEASE    . CATARACT EXTRACTION Left 07/12/2013  . MANDIBLE SURGERY  1991  . NASAL SINUS SURGERY  1990/2001  . ORIF METACARPAL FRACTURE  8/13   rt   . PORT-A-CATH REMOVAL     in and now out  . rt mastectomy  2004   lymph nodes-7-axillary node dissection    OB History    Gravida Para Term Preterm AB Living   3 2     1 2    SAB TAB  Ectopic Multiple Live Births   0 1             Home Medications    Prior to Admission medications   Medication Sig Start Date End Date Taking? Authorizing Provider  ALPRAZOLAM XR 3 MG 24 hr tablet Take 3 mg by mouth at bedtime. 06/30/16   [provider]  furosemide (LASIX) 20 MG tablet Take 40 mg daily by mouth.  11/24/16   [provider]  hydrocodone-ibuprofen (VICOPROFEN) 5-200 MG tablet Take 1 tablet every 8 (eight) hours as needed by mouth for pain. 05/22/17   Noe Gens, PA-C  metoprolol succinate (TOPROL-XL) 50 MG 24 hr tablet Take 1 tablet by mouth daily. 08/23/16   [provider]  triazolam (HALCION) 0.25 MG tablet Take 0.25 mg by mouth at bedtime as needed for sleep.    [provider]    Family  History Family History  Problem Relation Age of Onset  . Heart disease Father   . Heart disease Mother   . Hypertension Mother   . Rectal cancer Mother        dx in her early 17s  . Colon polyps Mother   . Breast cancer Maternal Aunt        dx in her early 33s  . Heart disease Paternal Uncle   . Heart disease Paternal Grandmother   . Heart disease Paternal Grandfather   . Diabetes Brother   . Breast cancer Maternal Grandmother        dx in her 90s  . Breast cancer Cousin        dx in early 67s  . Breast cancer Cousin 80  . Hepatitis C Son   . Hypertension Son   . Post-traumatic stress disorder Son     Social History Social History   Tobacco Use  . Smoking status: Never Smoker  . Smokeless tobacco: Never Used  Substance Use Topics  . Alcohol use: No    Alcohol/week: 0.0 oz  . Drug use: No     Allergies   Acetaminophen; Amoxicillin-pot clavulanate; Azithromycin; Cefuroxime axetil; Cephalexin; Clonazepam; Hydrocodone-acetaminophen; Hydrocodone-acetaminophen; Levofloxacin; Morphine and related; Ondansetron; Propoxyphene n-acetaminophen; Rosuvastatin; Sulfonamide derivatives; Tramadol; Trazodone and nefazodone; Vioxx [rofecoxib]; Zofran [ondansetron hcl]; Clarithromycin; Codeine; Seroquel [quetiapine fumarate]; and Sulfamethoxazole   Review of Systems Review of Systems  Musculoskeletal: Positive for arthralgias, joint swelling and myalgias. Negative for neck pain and neck stiffness.  Skin: Positive for color change. Negative for wound.  Neurological: Negative for weakness and numbness.     Physical Exam Triage Vital Signs ED Triage Vitals  Enc Vitals Group     BP 05/22/17 1700 (!) 174/67     Pulse Rate 05/22/17 1700 82     Resp 05/22/17 1700 18     Temp 05/22/17 1700 97.7 F (36.5 C)     Temp Source 05/22/17 1700 Oral     SpO2 05/22/17 1700 99 %     Weight --      Height --      Head Circumference --      Peak Flow --      Pain Score 05/22/17 1701 7      Pain Loc --      Pain Edu? --      Excl. in [place]? --    No data found.  Updated Vital Signs BP (!) 174/67 (BP Location: Right Arm)   Pulse 82   Temp 97.7 F (36.5 C) (Oral)   Resp 18  LMP 01/10/2003   SpO2 99%  Physical Exam  Constitutional: She is oriented to person, place, and time. She appears well-developed and well-nourished. No distress.  HENT:  Head: Normocephalic and atraumatic.  Eyes: EOM are normal.  Neck: Normal range of motion.  Cardiovascular: Normal rate.  Pulmonary/Chest: Effort normal.  Musculoskeletal: Normal range of motion. She exhibits edema and tenderness.  Left wrist: mild edema. Tenderness to ulnar and radial aspect. Full ROM. Minimal tenderness into base of hand. Full ROM all fingers. Minimal snuff box tenderness.  5/5 grip strength. Left elbow: full ROM, non-tender  Neurological: She is alert and oriented to person, place, and time.  Skin: Skin is warm and dry. She is not diaphoretic.     Left wrist: skin in tact. Ecchymosis along ulnar aspect of wrist. Tender.   Psychiatric: She has a normal mood and affect. Her behavior is normal.  Nursing note and vitals reviewed.    UC Treatments / Results  Labs (all labs ordered are listed, but only abnormal results are displayed) Labs Reviewed - No data to display  EKG  EKG Interpretation None       Radiology Dg Wrist Complete Left  Result Date: 05/22/2017 CLINICAL DATA:  Per pt: fell yesterday stepping up onto a curb, injury to the left wrist. Pain is the lateral, distal radius and base of the left thumb and thenar. Prior surgery in 2010 for carpal tunnel, left hand surgery following shortly afte.*comment was truncated* EXAM: LEFT WRIST - COMPLETE 3+ VIEW COMPARISON:  Hand films same day FINDINGS: Linear lucency through the metaphysis of the distal radius suggest nondisplaced fracture. Nondisplaced ulnar styloid fracture. No carpal fracture IMPRESSION: Nondisplaced fracture of the distal radius.  Nondisplaced ulnar styloid fracture. Electronically Signed   By: Suzy Bouchard M.D.   On: 05/22/2017 17:53   Dg Hand Complete Left  Result Date: 05/22/2017 CLINICAL DATA:  Tripped yesterday and fell, injuring left hand. EXAM: LEFT HAND - COMPLETE 3+ VIEW COMPARISON:  Radiographs 05/07/2013 FINDINGS: The joint spaces are maintained. Minimal DIP and PIP joint degenerative changes. No acute fractures identified. Unfused secondary ossification center versus an old avulsion fracture involving the third proximal phalanx. The radiocarpal and intercarpal joint spaces are maintained. Could not exclude the possibility of distal radius and ulnar fractures. Recommend correlation with clinical examination and left wrist radiographs. IMPRESSION: No acute fracture of the left hand is identified. Possible wrist fractures as discussed above. Recommend clinical correlation and left wrist radiographs. Electronically Signed   By: Marijo Sanes M.D.   On: 05/22/2017 17:31    Procedures Procedures (including critical care time)  Medications Ordered in UC Medications - No data to display   Initial Impression / Assessment and Plan / UC Course  I have reviewed the triage vital signs and the nursing notes.  Pertinent labs & imaging results that were available during my care of the patient were reviewed by me and considered in my medical decision making (see chart for details).     Hx and exam c/w closed nondisplaced fracture of distal ulna and radius.   Pt placed in ulnar gutter splint. No hand surgery on call for [facility] this evening, however, pt has seen Dr. Fredna Dow in the past for carpal tunnel surgery, encouraged pt to schedule f/u appointment with him in 1-2 weeks. Pt states she is "allergic" to aspirin with nausea but no NSAIDs are listed on pt's allergy list. Will have pt try Vicoprofen as needed for severe pain. Encouraged f/u with PCP  or hand surgeon if different pain medication needed. Encouraged to eat 30  minutes prior to taking medication to help prevent stomach upset.   Final Clinical Impressions(s) / UC Diagnoses   Final diagnoses:  Closed traumatic nondisplaced fracture of distal end of left ulna, initial encounter  Closed traumatic nondisplaced fracture of distal end of left radius, initial encounter  Fall from slip, trip, or stumble, initial encounter    ED Discharge Orders        Ordered    hydrocodone-ibuprofen (VICOPROFEN) 5-200 MG tablet  Every 8 hours PRN     05/22/17 1816       Controlled Substance Prescriptions [place] Controlled Substance Registry consulted? Yes, I have consulted the  Controlled Substances Registry for this patient, and feel the risk/benefit ratio today is favorable for proceeding with this prescription for a controlled substance.   Noe Gens, [place] 05/22/17 1843

## 2017-05-22 NOTE — Discharge Instructions (Signed)
°  Vicoprofen (hydrocodone-ibuprofen) is a narcotic pain medication, do not combine these medications with others containing NSAIDs- ibuprofen, naproxen, or aspirin. While taking, do not drink alcohol, drive, or perform any other activities that requires focus while taking these medications.  Do not take with other sedating medications that cause drowsiness as this can lead to unintended over dose- difficulty breathing or even death.  If you develop rash, throat or tongue swelling, difficulty breathing, please stop taking the medication immediately and call 911 or go to closest emergency department.  Please follow up with your primary care provider to review your allergy list as you may benefit from seeing an allergist to narrow down which medications you can and cannot take.  Be sure to eat a meal at least 30 minutes prior to taking your pain medication to help prevent stomach upset.

## 2017-05-22 NOTE — ED Triage Notes (Signed)
Pt sts fell yesterday having left wrist pain since fall

## 2017-05-22 NOTE — Progress Notes (Signed)
Orthopedic Tech Progress Note Patient Details:  Kylie Tucker 09-10-1950 [id number]  Ortho Devices Type of Ortho Device: Ace wrap, Ulna gutter splint Ortho Device/Splint Location: LUE Ortho Device/Splint Interventions: Ordered, Application   Braulio Bosch 05/22/2017, 6:37 PM

## 2017-05-24 DIAGNOSIS — Z853 Personal history of malignant neoplasm of breast: Secondary | ICD-10-CM | POA: Diagnosis not present

## 2017-05-24 DIAGNOSIS — M25542 Pain in joints of left hand: Secondary | ICD-10-CM | POA: Diagnosis not present

## 2017-05-24 DIAGNOSIS — C50911 Malignant neoplasm of unspecified site of right female breast: Secondary | ICD-10-CM | POA: Diagnosis not present

## 2017-06-01 ENCOUNTER — Ambulatory Visit
Admission: RE | Admit: 2017-06-01 | Discharge: 2017-06-01 | Disposition: A | Discharge status: Home / Self Care | Payer: PPO | Source: Ambulatory Visit | Attending: Oncology | Admitting: Oncology

## 2017-06-01 DIAGNOSIS — Z1231 Encounter for screening mammogram for malignant neoplasm of breast: Secondary | ICD-10-CM

## 2017-06-06 ENCOUNTER — Ambulatory Visit
Admission: RE | Admit: 2017-06-06 | Discharge: 2017-06-06 | Disposition: A | Discharge status: Home / Self Care | Payer: PPO | Source: Ambulatory Visit | Attending: Adult Health | Admitting: Adult Health

## 2017-06-06 DIAGNOSIS — N6489 Other specified disorders of breast: Secondary | ICD-10-CM | POA: Diagnosis not present

## 2017-06-06 DIAGNOSIS — Z1231 Encounter for screening mammogram for malignant neoplasm of breast: Secondary | ICD-10-CM

## 2017-06-06 MED ORDER — GADOBENATE DIMEGLUMINE 529 MG/ML IV SOLN
13.0000 mL | Freq: Once | INTRAVENOUS | Status: AC | PRN
  Administered 2017-06-06: 13 mL via INTRAVENOUS

## 2017-06-13 ENCOUNTER — Ambulatory Visit: Payer: PPO | Attending: Gynecologic Oncology | Admitting: Gynecologic Oncology

## 2017-06-13 ENCOUNTER — Ambulatory Visit (HOSPITAL_BASED_OUTPATIENT_CLINIC_OR_DEPARTMENT_OTHER): Payer: PPO

## 2017-06-13 ENCOUNTER — Encounter: Payer: Self-pay | Admitting: Gynecologic Oncology

## 2017-06-13 VITALS — BP 149/73 | HR 64 | Temp 98.0°F | Resp 18 | Ht 62.25 in | Wt 153.6 lb

## 2017-06-13 DIAGNOSIS — Z803 Family history of malignant neoplasm of breast: Secondary | ICD-10-CM | POA: Insufficient documentation

## 2017-06-13 DIAGNOSIS — Z9889 Other specified postprocedural states: Secondary | ICD-10-CM | POA: Insufficient documentation

## 2017-06-13 DIAGNOSIS — Z833 Family history of diabetes mellitus: Secondary | ICD-10-CM | POA: Insufficient documentation

## 2017-06-13 DIAGNOSIS — Z79899 Other long term (current) drug therapy: Secondary | ICD-10-CM | POA: Diagnosis not present

## 2017-06-13 DIAGNOSIS — Z8371 Family history of colonic polyps: Secondary | ICD-10-CM | POA: Insufficient documentation

## 2017-06-13 DIAGNOSIS — Z882 Allergy status to sulfonamides status: Secondary | ICD-10-CM | POA: Diagnosis not present

## 2017-06-13 DIAGNOSIS — Z885 Allergy status to narcotic agent status: Secondary | ICD-10-CM | POA: Diagnosis not present

## 2017-06-13 DIAGNOSIS — Z9221 Personal history of antineoplastic chemotherapy: Secondary | ICD-10-CM | POA: Insufficient documentation

## 2017-06-13 DIAGNOSIS — N83201 Unspecified ovarian cyst, right side: Secondary | ICD-10-CM | POA: Diagnosis not present

## 2017-06-13 DIAGNOSIS — Z888 Allergy status to other drugs, medicaments and biological substances status: Secondary | ICD-10-CM | POA: Insufficient documentation

## 2017-06-13 DIAGNOSIS — N83202 Unspecified ovarian cyst, left side: Secondary | ICD-10-CM | POA: Diagnosis not present

## 2017-06-13 DIAGNOSIS — Z853 Personal history of malignant neoplasm of breast: Secondary | ICD-10-CM | POA: Insufficient documentation

## 2017-06-13 DIAGNOSIS — Z170 Estrogen receptor positive status [ER+]: Secondary | ICD-10-CM | POA: Diagnosis not present

## 2017-06-13 DIAGNOSIS — Z9011 Acquired absence of right breast and nipple: Secondary | ICD-10-CM | POA: Diagnosis not present

## 2017-06-13 DIAGNOSIS — Z8249 Family history of ischemic heart disease and other diseases of the circulatory system: Secondary | ICD-10-CM | POA: Insufficient documentation

## 2017-06-13 DIAGNOSIS — Z880 Allergy status to penicillin: Secondary | ICD-10-CM | POA: Diagnosis not present

## 2017-06-13 DIAGNOSIS — F419 Anxiety disorder, unspecified: Secondary | ICD-10-CM | POA: Insufficient documentation

## 2017-06-13 DIAGNOSIS — Z800 Family history of malignant neoplasm of digestive organs: Secondary | ICD-10-CM | POA: Insufficient documentation

## 2017-06-13 NOTE — Progress Notes (Signed)
Consult Note: Gyn-Onc  Consult was requested by Dr. Quincy Simmonds for the evaluation of Kylie Tucker 66 y.o. female  CC:  Chief Complaint  Patient presents with  . Ovarian Cyst    Assessment/Plan:  Kylie. Kylie Tucker  is a 66 y.o.  year old with bilateral ovarian cysts. I have looked at the CT and ultrasound images from last year and this year. The CA 125 last year was very low and reassuring. While there has been subtle 1cm growth, they remain reassuring in their appearance.  I offered Kylie Tucker two options: to have surgical removal (laparoscopic BSO) or to have close surveillance with annual US's and surgery only if symptoms developed. She is opting for the latter as she is not interested in surgery if not absolutely necessary.  I feel that this is reasonable given my low suspicion for malignancy.  She will have CA 125 to monitor for stability, then follow-up with Dr Quincy Simmonds in 1 year for surveillance.   HPI: Kylie Tucker is a 66 year old woman who is seen in consultation at the request of Dr Quincy Simmonds for bilateral ovarian cysts.  She was diagnosed with breast cancer in 2017 treated with chemotherapy, surgery and radiation. During treatment in the summer of 2017 she had abdominal pain (from H pylori) and as part of the work-up for this a CT scan was performed which showed a 2+cm right ovarian cyst. CA 125 at that time was normal at 6. She had a confirmatory [place] which showed small bilateral ovarian cysts.  Repeat US this year on 05/19/17 showed subtle increase in the cysts measirng 3x3.3x2.7 on the left and 4.5x3.7x3.5cm on the right. These comprised multiple simple cysts in aggregate and with no solid components or increased vascularity.   She is asymptomatic with no pelvic symptoms.  She has had no prior abdominal surgeries.  She states that she received genetic testing for BRCA which was negative.   Current Meds:  Outpatient Encounter Medications as of 06/13/2017  Medication Sig   . ALPRAZOLAM XR 3 MG 24 hr tablet Take 3 mg by mouth at bedtime.  . furosemide (LASIX) 20 MG tablet Take 40 mg daily by mouth.   . metoprolol succinate (TOPROL-XL) 50 MG 24 hr tablet Take 1 tablet by mouth daily.  . [DISCONTINUED] hydrocodone-ibuprofen (VICOPROFEN) 5-200 MG tablet Take 1 tablet every 8 (eight) hours as needed by mouth for pain.  . [DISCONTINUED] triazolam (HALCION) 0.25 MG tablet Take 0.25 mg by mouth at bedtime as needed for sleep.   No facility-administered encounter medications on file as of 06/13/2017.     Allergy:  Allergies  Allergen Reactions  . Acetaminophen Nausea Only    Other reaction(s): Other (See Comments)  . Amoxicillin-Pot Clavulanate Nausea Only    flushing  . Azithromycin     Heart palpitations   . Cefuroxime Axetil Nausea Only  . Cephalexin Nausea Only  . Clonazepam     Heart had a "weird feeling" and difficulty breathing  . Hydrocodone-Acetaminophen Hives  . Hydrocodone-Acetaminophen     Other reaction(s): GI Upset (intolerance)  . Levofloxacin Nausea Only  . Morphine And Related Other (See Comments)    Hallucinations  . Ondansetron Nausea And Vomiting  . Propoxyphene N-Acetaminophen Nausea And Vomiting  . Rosuvastatin     REACTION: myalgia  . Sulfonamide Derivatives Itching  . Tramadol Nausea And Vomiting  . Trazodone And Nefazodone Nausea Only    Felt like "I was burning all over" -patient  .  Vioxx [Rofecoxib] Nausea And Vomiting  . Zofran [Ondansetron Hcl] Nausea And Vomiting  . Clarithromycin Nausea Only and Anxiety  . Codeine Itching and Rash  . Seroquel [Quetiapine Fumarate] Palpitations    Heart racing   . Sulfamethoxazole Rash    Social Hx:   Social History   Socioeconomic History  . Marital status: Married    Spouse name: Not on file  . Number of children: Not on file  . Years of education: Not on file  . Highest education level: Not on file  Social Needs  . Financial resource strain: Not on file  . Food  insecurity - worry: Not on file  . Food insecurity - inability: Not on file  . Transportation needs - medical: Not on file  . Transportation needs - non-medical: Not on file  Occupational History  . Occupation: retired  Tobacco Use  . Smoking status: Never Smoker  . Smokeless tobacco: Never Used  Substance and Sexual Activity  . Alcohol use: No    Alcohol/week: 0.0 oz  . Drug use: No  . Sexual activity: Yes    Partners: Male    Birth control/protection: Post-menopausal  Other Topics Concern  . Not on file  Social History Narrative  . Not on file    Past Surgical Hx:  Past Surgical History:  Procedure Laterality Date  . BREAST EXCISIONAL BIOPSY Left    benign  . CARPAL TUNNEL RELEASE    . CATARACT EXTRACTION Left 07/12/2013  . MANDIBLE SURGERY  1991  . MASTECTOMY Right    malignant  . NASAL SINUS SURGERY  1990/2001  . ORIF FINGER FRACTURE  02/14/2012   Procedure: OPEN REDUCTION INTERNAL FIXATION (ORIF) METACARPAL (FINGER) FRACTURE;  Surgeon: Wynonia Sours, MD;  Location: [facility];  Service: Orthopedics;  Laterality: Right;  RIGHT FIFTH   . ORIF METACARPAL FRACTURE  8/13   rt   . PORT-A-CATH REMOVAL     in and now out  . REPAIR EXTENSOR TENDON Left 05/30/2013   Procedure: RELEASE TRANSPOSITION EXTENSOR POLLICUS LONGUS LEFT WRIST;  Surgeon: Wynonia Sours, MD;  Location: [facility];  Service: Orthopedics;  Laterality: Left;  . rt mastectomy  2004   lymph nodes-7-axillary node dissection  . SHOULDER ARTHROSCOPY WITH ROTATOR CUFF REPAIR AND SUBACROMIAL DECOMPRESSION Left 11/22/2013   Procedure: LEFT SHOULDER ARTHROSCOPY WITH SUBACROMIAL DECOMPRESSION DISTAL CLAVICLE RESECTION REPAIR  ROTATOR CUFF AS NEEDED ;  Surgeon: Cammie Sickle., MD;  Location: [facility];  Service: Orthopedics;  Laterality: Left;    Past Medical Hx:  Past Medical History:  Diagnosis Date  . Allergy   . Anxiety   . Bilateral ovarian cysts    Do yearly  ultrasound.  . Cancer ([place]) rt breast   2004/ chemo/ radiation  . Cellulitis and abscess of upper arm and forearm   . Family history of breast cancer   . Family history of rectal cancer   . GERD (gastroesophageal reflux disease)   . H/O Helicobacter infection 2017  . History of right mastectomy 2004   chemo/ radiation  . Hyperlipidemia   . Hypertension   . Insomnia   . Left bundle branch block 2016  . Migraines   . PONV (postoperative nausea and vomiting)    cannot take zofran  . Postmastectomy lymphedema rt side  . Sentinel node     Past Gynecological History:   Patient's last menstrual period was 01/10/2003.  Family Hx:  Family History  Problem  Relation Age of Onset  . Heart disease Father   . Heart disease Mother   . Hypertension Mother   . Rectal cancer Mother        dx in her early 2s  . Colon polyps Mother   . Breast cancer Maternal Aunt        dx in her early 52s  . Heart disease Paternal Uncle   . Heart disease Paternal Grandmother   . Heart disease Paternal Grandfather   . Diabetes Brother   . Breast cancer Maternal Grandmother        dx in her 4s  . Breast cancer Cousin        dx in early 73s  . Breast cancer Cousin 83  . Hepatitis C Son   . Hypertension Son   . Post-traumatic stress disorder Son     Review of Systems:  Constitutional  Feels well,    ENT Normal appearing ears and nares bilaterally Skin/Breast  No rash, sores, jaundice, itching, dryness Cardiovascular  No chest pain, shortness of breath, or edema  Pulmonary  No cough or wheeze.  Gastro Intestinal  No nausea, vomitting, or diarrhoea. No bright red blood per rectum, no abdominal pain, change in bowel movement, or constipation.  Genito Urinary  No frequency, urgency, dysuria, she is amenorrheic  Musculo Skeletal  No myalgia, arthralgia, joint swelling or pain  Neurologic  No weakness, numbness, change in gait,  Psychology  No depression, anxiety, insomnia.   Vitals:   Blood pressure (!) 149/73, pulse 64, temperature 98 F (36.7 C), temperature source Oral, resp. rate 18, height 5' 2.25" (1.581 m), weight 153 lb 9.6 oz (69.7 kg), last menstrual period 01/10/2003, SpO2 100 %.  Physical Exam: WD in NAD Neck  Supple NROM, without any enlargements.  Lymph Node Survey No cervical supraclavicular or inguinal adenopathy Cardiovascular  Pulse normal rate, regularity and rhythm. S1 and S2 normal.  Lungs  Clear to auscultation bilateraly, without wheezes/crackles/rhonchi. Good air movement.  Skin  No rash/lesions/breakdown  Psychiatry  Alert and oriented to person, place, and time  Abdomen  Normoactive bowel sounds, abdomen soft, non-tender and nonobese without evidence of hernia.  Back No CVA tenderness Genito Urinary  Vulva/vagina: Normal external female genitalia.  No lesions. No discharge or bleeding.  Bladder/urethra:  No lesions or masses, well supported bladder  Vagina: normal  Cervix: Normal appearing, no lesions.  Uterus:  Small, mobile, no parametrial involvement or nodularity.  Adnexa: no palpable masses. Rectal  deferred Extremities  No bilateral cyanosis, clubbing or edema.   Donaciano Eva, MD  06/13/2017, 1:05 PM

## 2017-06-13 NOTE — Patient Instructions (Signed)
Please notify Dr Denman George at phone number [phone] if you notice vaginal bleeding, new pelvic or abdominal pains, bloating, feeling full easy, or a change in bladder or bowel function.   Please return to see Dr Quincy Simmonds in 12 months for repeat ultrasound.

## 2017-06-14 LAB — CA 125: CANCER ANTIGEN (CA) 125: 11.2 U/mL (ref 0.0–38.1)

## 2017-06-15 ENCOUNTER — Telehealth: Payer: Self-pay

## 2017-06-15 NOTE — Telephone Encounter (Signed)
Left message for patient that MRI results were normal. Left number to center for call back for questions or concerns.

## 2017-06-15 NOTE — Telephone Encounter (Signed)
-----   Message from Gardenia Phlegm, NP sent at 06/15/2017  2:45 PM EST ----- Please call patient with normal results ----- Message ----- From: Interface, Rad Results In Sent: 06/06/2017   2:19 PM To: Gardenia Phlegm, NP

## 2017-06-17 ENCOUNTER — Other Ambulatory Visit: Payer: PPO

## 2017-06-28 DIAGNOSIS — M25532 Pain in left wrist: Secondary | ICD-10-CM | POA: Diagnosis not present

## 2017-07-13 ENCOUNTER — Other Ambulatory Visit: Payer: PPO

## 2017-07-19 ENCOUNTER — Inpatient Hospital Stay: Admission: RE | Admit: 2017-07-19 | Payer: PPO | Source: Ambulatory Visit

## 2017-07-20 ENCOUNTER — Institutional Professional Consult (permissible substitution): Payer: PPO | Admitting: Neurology

## 2017-07-21 DIAGNOSIS — H43813 Vitreous degeneration, bilateral: Secondary | ICD-10-CM | POA: Diagnosis not present

## 2017-07-21 DIAGNOSIS — H5319 Other subjective visual disturbances: Secondary | ICD-10-CM | POA: Diagnosis not present

## 2017-07-21 DIAGNOSIS — H524 Presbyopia: Secondary | ICD-10-CM | POA: Diagnosis not present

## 2017-07-21 DIAGNOSIS — Z961 Presence of intraocular lens: Secondary | ICD-10-CM | POA: Diagnosis not present

## 2017-07-21 DIAGNOSIS — H04123 Dry eye syndrome of bilateral lacrimal glands: Secondary | ICD-10-CM | POA: Diagnosis not present

## 2017-07-28 DIAGNOSIS — Z0000 Encounter for general adult medical examination without abnormal findings: Secondary | ICD-10-CM | POA: Diagnosis not present

## 2017-07-28 DIAGNOSIS — R39198 Other difficulties with micturition: Secondary | ICD-10-CM | POA: Diagnosis not present

## 2017-07-28 DIAGNOSIS — Z0001 Encounter for general adult medical examination with abnormal findings: Secondary | ICD-10-CM | POA: Diagnosis not present

## 2017-07-28 DIAGNOSIS — Z1389 Encounter for screening for other disorder: Secondary | ICD-10-CM | POA: Diagnosis not present

## 2017-07-28 DIAGNOSIS — Z6828 Body mass index (BMI) 28.0-28.9, adult: Secondary | ICD-10-CM | POA: Diagnosis not present

## 2017-07-28 DIAGNOSIS — E663 Overweight: Secondary | ICD-10-CM | POA: Diagnosis not present

## 2017-07-28 DIAGNOSIS — Z23 Encounter for immunization: Secondary | ICD-10-CM | POA: Diagnosis not present

## 2017-07-28 DIAGNOSIS — N342 Other urethritis: Secondary | ICD-10-CM | POA: Diagnosis not present

## 2017-08-04 ENCOUNTER — Encounter (HOSPITAL_COMMUNITY): Payer: Self-pay | Admitting: Emergency Medicine

## 2017-08-04 ENCOUNTER — Emergency Department (HOSPITAL_COMMUNITY): Payer: PPO

## 2017-08-04 ENCOUNTER — Emergency Department (HOSPITAL_COMMUNITY)
Admission: EM | Admit: 2017-08-04 | Discharge: 2017-08-04 | Disposition: A | Discharge status: Home / Self Care | Payer: PPO | Attending: Emergency Medicine | Admitting: Emergency Medicine

## 2017-08-04 ENCOUNTER — Other Ambulatory Visit: Payer: Self-pay

## 2017-08-04 DIAGNOSIS — I5022 Chronic systolic (congestive) heart failure: Secondary | ICD-10-CM | POA: Diagnosis not present

## 2017-08-04 DIAGNOSIS — R410 Disorientation, unspecified: Secondary | ICD-10-CM | POA: Diagnosis not present

## 2017-08-04 DIAGNOSIS — I10 Essential (primary) hypertension: Secondary | ICD-10-CM | POA: Diagnosis not present

## 2017-08-04 DIAGNOSIS — Z79899 Other long term (current) drug therapy: Secondary | ICD-10-CM | POA: Diagnosis not present

## 2017-08-04 DIAGNOSIS — R4182 Altered mental status, unspecified: Secondary | ICD-10-CM | POA: Diagnosis not present

## 2017-08-04 DIAGNOSIS — I6789 Other cerebrovascular disease: Secondary | ICD-10-CM | POA: Diagnosis not present

## 2017-08-04 DIAGNOSIS — I110 Hypertensive heart disease with heart failure: Secondary | ICD-10-CM | POA: Diagnosis not present

## 2017-08-04 DIAGNOSIS — F4489 Other dissociative and conversion disorders: Secondary | ICD-10-CM | POA: Diagnosis not present

## 2017-08-04 LAB — COMPREHENSIVE METABOLIC PANEL
ALBUMIN: 4 g/dL (ref 3.5–5.0)
ALK PHOS: 72 U/L (ref 38–126)
ALT: 22 U/L (ref 14–54)
ANION GAP: 10 (ref 5–15)
AST: 24 U/L (ref 15–41)
BILIRUBIN TOTAL: 0.5 mg/dL (ref 0.3–1.2)
BUN: 15 mg/dL (ref 6–20)
CALCIUM: 9.1 mg/dL (ref 8.9–10.3)
CO2: 25 mmol/L (ref 22–32)
Chloride: 101 mmol/L (ref 101–111)
Creatinine, Ser: 0.83 mg/dL (ref 0.44–1.00)
GLUCOSE: 144 mg/dL — AB (ref 65–99)
Potassium: 4 mmol/L (ref 3.5–5.1)
Sodium: 136 mmol/L (ref 135–145)
TOTAL PROTEIN: 7.3 g/dL (ref 6.5–8.1)

## 2017-08-04 LAB — CBC WITH DIFFERENTIAL/PLATELET
Basophils Absolute: 0 10*3/uL (ref 0.0–0.1)
Basophils Relative: 0 %
Eosinophils Absolute: 0 10*3/uL (ref 0.0–0.7)
Eosinophils Relative: 0 %
HEMATOCRIT: 44.3 % (ref 36.0–46.0)
HEMOGLOBIN: 14.6 g/dL (ref 12.0–15.0)
LYMPHS ABS: 1 10*3/uL (ref 0.7–4.0)
LYMPHS PCT: 12 %
MCH: 29.1 pg (ref 26.0–34.0)
MCHC: 33 g/dL (ref 30.0–36.0)
MCV: 88.2 fL (ref 78.0–100.0)
MONOS PCT: 3 %
Monocytes Absolute: 0.2 10*3/uL (ref 0.1–1.0)
NEUTROS ABS: 6.6 10*3/uL (ref 1.7–7.7)
NEUTROS PCT: 85 %
Platelets: 287 10*3/uL (ref 150–400)
RBC: 5.02 MIL/uL (ref 3.87–5.11)
RDW: 13.4 % (ref 11.5–15.5)
WBC: 7.7 10*3/uL (ref 4.0–10.5)

## 2017-08-04 LAB — RAPID URINE DRUG SCREEN, HOSP PERFORMED
Amphetamines: NOT DETECTED
BARBITURATES: NOT DETECTED
Benzodiazepines: POSITIVE — AB
COCAINE: NOT DETECTED
Opiates: NOT DETECTED
Tetrahydrocannabinol: NOT DETECTED

## 2017-08-04 LAB — URINALYSIS, ROUTINE W REFLEX MICROSCOPIC
Bilirubin Urine: NEGATIVE
GLUCOSE, UA: NEGATIVE mg/dL
Hgb urine dipstick: NEGATIVE
KETONES UR: NEGATIVE mg/dL
LEUKOCYTES UA: NEGATIVE
Nitrite: NEGATIVE
PROTEIN: NEGATIVE mg/dL
Specific Gravity, Urine: 1.011 (ref 1.005–1.030)
pH: 6 (ref 5.0–8.0)

## 2017-08-04 LAB — ETHANOL: Alcohol, Ethyl (B): <10 mg/dL (ref <10)

## 2017-08-04 MED ORDER — SODIUM CHLORIDE 0.9 % IV BOLUS (SEPSIS): 250.0000 mL | Freq: Once | INTRAVENOUS | Status: DC

## 2017-08-04 MED ORDER — SODIUM CHLORIDE 0.9 % IV SOLN: INTRAVENOUS | Status: DC

## 2017-08-04 NOTE — ED Notes (Signed)
Pt states she ran out of her Xanax Tuesday, and feels like these symptoms are from withdrawals. Pt can not refill, due to taking too many, script is early

## 2017-08-04 NOTE — Discharge Instructions (Signed)
Workup for the period of confusion without any acute findings.  Recommend follow-up with primary care doctor.  Return for any new or worse symptoms.  Continue medications as directed.

## 2017-08-04 NOTE — ED Triage Notes (Addendum)
Ems called out to pt's house for stroke.  Ems states stroke screen negative but pt was having some intermittent confusion.  Per family member her confusion is more than normal. Recent hx of uti.  Pt is awake, alert and ambulating without difficulty at this time.  BG was 256

## 2017-08-04 NOTE — ED Notes (Signed)
Ambulatory to the bathroom 

## 2017-08-04 NOTE — ED Notes (Signed)
Per husband, doctor and pharmacy agreed to give him refill of her medication to lock up and administer. Pt has dementia

## 2017-08-04 NOTE — ED Provider Notes (Signed)
[facility] EMERGENCY DEPARTMENT Provider Note   CSN: [id number] Arrival date & time: 08/04/17  1204     History   Chief Complaint Chief Complaint  Patient presents with  . Weakness    HPI TANGEE MARSZALEK is a 67 y.o. female.  Patient brought in by EMS.  They were called out for possible stroke.  EMS states stroke screen was negative but patient was having some intermittent confusion.  Patient last evening was having kind of the jitters and could not sleep well.  But no chest pain.  Family member told EMS that her confusion is more than normal.  A recent history of urinary tract infection.  Blood sugar for EMS was 256.  Stroke concern seems to be more for the confusion and the jittering that occurred last night.  Husband states her really was no specific speech problem or inability to move an arm or leg.      Past Medical History:  Diagnosis Date  . Allergy   . Anxiety   . Bilateral ovarian cysts    Do yearly ultrasound.  . Cancer ([place]) rt breast   2004/ chemo/ radiation  . Cellulitis and abscess of upper arm and forearm   . Family history of breast cancer   . Family history of rectal cancer   . GERD (gastroesophageal reflux disease)   . H/O Helicobacter infection 2017  . History of right mastectomy 2004   chemo/ radiation  . Hyperlipidemia   . Hypertension   . Insomnia   . Left bundle branch block 2016  . Migraines   . PONV (postoperative nausea and vomiting)    cannot take zofran  . Postmastectomy lymphedema rt side  . Sentinel node     Patient Active Problem List   Diagnosis Date Noted  . Bilateral ovarian cysts 05/20/2017  . Hyponatremia 11/28/2016  . N&V (nausea and vomiting) 11/28/2016  . Abdominal pain 11/28/2016  . Sleep talking 07/22/2016  . Genetic testing 06/01/2016  . Family history of breast cancer   . Family history of rectal cancer   . Chest pain 09/23/2015  . Abnormal vision 06/13/2015  . Anxiety disorder 06/13/2015  . Cataract  cortical, senile 06/13/2015  . Clinical depression 06/13/2015  . Cephalalgia 06/13/2015  . H/O malignant neoplasm of breast 06/13/2015  . 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 Sinus infection 06/13/2015  . Long term current use of systemic steroids 06/13/2015  . Chronic systolic CHF (congestive heart failure), NYHA class 1 ([place]) 05/09/2015  . LBBB (left bundle branch block) 03/26/2015  . Lymphedema of arm 12/13/2013  . Complete rupture of rotator cuff 11/22/2013  . Nipple discharge in female 12/01/2012  . History of breast cancer 11/30/2012  . Epiphora 11/28/2012  . Disorder of endocrine system 04/25/2012  . Ceratitis 04/25/2012  . Blepharitis 04/25/2012  . Breast cancer, right breast ([place])   . FATIGUE 10/01/2009  . 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 Dyspnea 10/01/2009  . HYPERLIPIDEMIA 09/30/2009    Past Surgical History:  Procedure Laterality Date  . BREAST EXCISIONAL BIOPSY Left    benign  . CARPAL TUNNEL RELEASE    . CATARACT EXTRACTION Left 07/12/2013  . MANDIBLE SURGERY  1991  . MASTECTOMY Right    malignant  . NASAL SINUS SURGERY  1990/2001  . ORIF FINGER FRACTURE  02/14/2012   Procedure: OPEN REDUCTION INTERNAL FIXATION (ORIF) METACARPAL (FINGER) FRACTURE;  Surgeon: Wynonia Sours, MD;  Location: [facility];  Service: Orthopedics;  Laterality: Right;  RIGHT  FIFTH   . ORIF METACARPAL FRACTURE  8/13   rt   . PORT-A-CATH REMOVAL     in and now out  . REPAIR EXTENSOR TENDON Left 05/30/2013   Procedure: RELEASE TRANSPOSITION EXTENSOR POLLICUS LONGUS LEFT WRIST;  Surgeon: Wynonia Sours, MD;  Location: [facility];  Service: Orthopedics;  Laterality: Left;  . rt mastectomy  2004   lymph nodes-7-axillary node dissection  . SHOULDER ARTHROSCOPY WITH ROTATOR CUFF REPAIR AND SUBACROMIAL DECOMPRESSION Left 11/22/2013   Procedure: LEFT SHOULDER ARTHROSCOPY WITH SUBACROMIAL DECOMPRESSION DISTAL CLAVICLE RESECTION REPAIR  ROTATOR CUFF AS NEEDED ;  Surgeon: Cammie Sickle., MD;  Location: [facility];  Service: Orthopedics;  Laterality: Left;    OB History    Gravida Para Term Preterm AB Living   3 2     1 2    SAB TAB Ectopic Multiple Live Births   0 1             Home Medications    Prior to Admission medications   Medication Sig Start Date End Date Taking? Authorizing Provider  ALPRAZOLAM XR 3 MG 24 hr tablet Take 3 mg by mouth at bedtime. 06/30/16  Yes [provider]  ciprofloxacin (CIPRO) 500 MG tablet Take 500 mg by mouth 2 (two) times daily. For 7 days 07/28/17  Yes [provider]  furosemide (LASIX) 20 MG tablet Take 40 mg daily by mouth.  11/24/16  Yes [provider]  metoprolol succinate (TOPROL-XL) 50 MG 24 hr tablet Take 1 tablet by mouth daily. 08/23/16  Yes [provider]  potassium chloride SA (K-DUR,KLOR-CON) 20 MEQ tablet Take 20 mEq by mouth daily. 07/27/17  Yes [provider]  triazolam (HALCION) 0.25 MG tablet Take 0.25 mg by mouth at bedtime. Take 1/2 tablet at bedtime as needed 07/16/17  Yes [provider]  metoCLOPramide (REGLAN) 10 MG tablet Take 1 tablet (10 mg total) by mouth 3 (three) times daily as needed for nausea (headache / nausea). Patient not taking: Reported on 12/19/2015 12/18/15 12/19/15  Noemi Chapel, MD    Family History Family History  Problem Relation Age of Onset  . Heart disease Father   . Heart disease Mother   . Hypertension Mother   . Rectal cancer Mother        dx in her early 18s  . Colon polyps Mother   . Breast cancer Maternal Aunt        dx in her early 6s  . Heart disease Paternal Uncle   . Heart disease Paternal Grandmother   . Heart disease Paternal Grandfather   . Diabetes Brother   . Breast cancer Maternal Grandmother        dx in her 29s  . Breast cancer Cousin        dx in early 89s  . Breast cancer Cousin 23  . Hepatitis C Son   . Hypertension Son   . Post-traumatic stress disorder Son     Social  History Social History   Tobacco Use  . Smoking status: Never Smoker  . Smokeless tobacco: Never Used  Substance Use Topics  . Alcohol use: No    Alcohol/week: 0.0 oz  . Drug use: No     Allergies   Acetaminophen; Amoxicillin-pot clavulanate; Azithromycin; Cefuroxime axetil; Cephalexin; Clonazepam; Hydrocodone-acetaminophen; Hydrocodone-acetaminophen; Levofloxacin; Morphine and related; Ondansetron; Propoxyphene n-acetaminophen; Rosuvastatin; Sulfonamide derivatives; Tramadol; Trazodone and nefazodone; Vioxx [rofecoxib]; Zofran [ondansetron hcl]; Clarithromycin; Codeine; Seroquel [quetiapine fumarate]; and  Sulfamethoxazole   Review of Systems Review of Systems  Constitutional: Negative for fever.  HENT: Negative for congestion.   Eyes: Negative for photophobia.  Respiratory: Negative for shortness of breath.   Cardiovascular: Negative for chest pain.  Gastrointestinal: Negative for abdominal pain.  Genitourinary: Negative for dysuria.  Musculoskeletal: Negative for back pain and neck pain.  Skin: Negative for rash.  Neurological: Negative for seizures, facial asymmetry, speech difficulty, weakness, numbness and headaches.  Hematological: Does not bruise/bleed easily.  Psychiatric/Behavioral: Positive for confusion.     Physical Exam Updated Vital Signs BP (!) 165/77 (BP Location: Left Arm)   Pulse 88   Temp 98.6 F (37 C) (Oral)   Resp 19   Ht 1.575 m (5\' 2" )   Wt 68 kg (150 lb)   LMP 01/10/2003   SpO2 93%   BMI 27.44 kg/m   Physical Exam  Constitutional: She is oriented to person, place, and time. She appears well-developed and well-nourished. No distress.  HENT:  Head: Normocephalic and atraumatic.  Mouth/Throat: Oropharynx is clear and moist.  Eyes: Conjunctivae are normal. Pupils are equal, round, and reactive to light.  Neck: Normal range of motion. Neck supple.  Cardiovascular: Normal rate, regular rhythm and normal heart sounds.  Pulmonary/Chest: Effort  normal and breath sounds normal. No respiratory distress.  Abdominal: Soft. Bowel sounds are normal. There is no tenderness.  Musculoskeletal: Normal range of motion. She exhibits no edema, tenderness or deformity.  Neurological: She is alert and oriented to person, place, and time. No cranial nerve deficit or sensory deficit. She exhibits normal muscle tone. Coordination normal.  Skin: Skin is warm.  Nursing note and vitals reviewed.    ED Treatments / Results  Labs (all labs ordered are listed, but only abnormal results are displayed) Labs Reviewed  COMPREHENSIVE METABOLIC PANEL - Abnormal; Notable for the following components:      Result Value   Glucose, Bld 144 (*)    All other components within normal limits  RAPID URINE DRUG SCREEN, HOSP PERFORMED - Abnormal; Notable for the following components:   Benzodiazepines POSITIVE (*)    All other components within normal limits  CBC WITH DIFFERENTIAL/PLATELET  URINALYSIS, ROUTINE W REFLEX MICROSCOPIC  ETHANOL    EKG  EKG Interpretation  Date/Time:  Thursday August 04 2017 13:59:29 EST Ventricular Rate:  69 PR Interval:    QRS Duration: 160 QT Interval:  495 QTC Calculation: 531 R Axis:   -38 Text Interpretation:  Sinus rhythm Probable left atrial enlargement Left bundle branch block Confirmed by Fredia Sorrow [phone]) on 08/04/2017 4:16:57 PM       Radiology Dg Chest 2 View  Result Date: 08/04/2017 CLINICAL DATA:  Altered mental status.  History of breast carcinoma EXAM: CHEST  2 VIEW COMPARISON:  Nov 28, 2016 FINDINGS: There is no edema or consolidation. There is mild scarring in the left base. Heart size and pulmonary vascularity are normal. No adenopathy. Patient is status post right mastectomy. No bone lesions. IMPRESSION: Mild scarring left base. No edema or consolidation. Status post right mastectomy. Electronically Signed   By: Lowella Grip III M.D.   On: 08/04/2017 14:18   Ct Head Wo Contrast  Result  Date: 08/04/2017 CLINICAL DATA:  67 year old female with a history of new onset confusion EXAM: CT HEAD WITHOUT CONTRAST TECHNIQUE: Contiguous axial images were obtained from the base of the skull through the vertex without intravenous contrast. COMPARISON:  05/10/2014 FINDINGS: Brain: No acute intracranial hemorrhage. No midline shift or mass  effect. Gray-white differentiation maintained. Unremarkable configuration the ventricles. Vascular: Minimal calcifications of the intracranial vasculature. Skull: No acute bony abnormality.  No aggressive bony lesions. Sinuses/Orbits: Unremarkable appearance of the orbits. Unremarkable paranasal sinuses. Other: None IMPRESSION: Head CT negative for acute abnormality. Electronically Signed   By: Corrie Mckusick D.O.   On: 08/04/2017 14:26    Procedures Procedures (including critical care time)  Medications Ordered in ED Medications  0.9 %  sodium chloride infusion (not administered)  sodium chloride 0.9 % bolus 250 mL (250 mLs Intravenous Not Given 08/04/17 1357)     Initial Impression / Assessment and Plan / ED Course  I have reviewed the triage vital signs and the nursing notes.  Pertinent labs & imaging results that were available during my care of the patient were reviewed by me and considered in my medical decision making (see chart for details).    Workup here without any acute findings.  Patient did have benzo diazepam and urine drug screen which she reportedly takes this daily.  Little vague whether she is still taking that or not.  Certainly no neuro deficit.  No chest pain.  Patient here acting very normal able to follow all commands.  Patient stable for discharge home and follow-up with primary care provider   Final Clinical Impressions(s) / ED Diagnoses   Final diagnoses:  Confusion    ED Discharge Orders    None       Fredia Sorrow, MD 08/04/17 1819

## 2017-08-04 NOTE — ED Notes (Signed)
Patient given discharge instruction, verbalized understand. Patient ambulatory out of the department.  

## 2017-08-19 ENCOUNTER — Other Ambulatory Visit: Payer: PPO

## 2017-09-06 ENCOUNTER — Ambulatory Visit (INDEPENDENT_AMBULATORY_CARE_PROVIDER_SITE_OTHER): Payer: PPO

## 2017-09-06 ENCOUNTER — Ambulatory Visit (HOSPITAL_COMMUNITY)
Admission: EM | Admit: 2017-09-06 | Discharge: 2017-09-06 | Disposition: A | Discharge status: Home / Self Care | Payer: PPO | Attending: Family Medicine | Admitting: Family Medicine

## 2017-09-06 ENCOUNTER — Encounter (HOSPITAL_COMMUNITY): Payer: Self-pay | Admitting: Emergency Medicine

## 2017-09-06 ENCOUNTER — Other Ambulatory Visit: Payer: Self-pay

## 2017-09-06 DIAGNOSIS — S6991XA Unspecified injury of right wrist, hand and finger(s), initial encounter: Secondary | ICD-10-CM | POA: Diagnosis not present

## 2017-09-06 DIAGNOSIS — W19XXXA Unspecified fall, initial encounter: Secondary | ICD-10-CM | POA: Diagnosis not present

## 2017-09-06 DIAGNOSIS — S62114A Nondisplaced fracture of triquetrum [cuneiform] bone, right wrist, initial encounter for closed fracture: Secondary | ICD-10-CM | POA: Diagnosis not present

## 2017-09-06 DIAGNOSIS — S62111S Displaced fracture of triquetrum [cuneiform] bone, right wrist, sequela: Secondary | ICD-10-CM

## 2017-09-06 NOTE — ED Triage Notes (Signed)
Pt reports falling about an hour ago and breaking her fall with her hands.  She is reporting right wrist pain.

## 2017-09-07 ENCOUNTER — Telehealth (HOSPITAL_COMMUNITY): Payer: Self-pay | Admitting: Emergency Medicine

## 2017-09-07 NOTE — Telephone Encounter (Signed)
Pt called asking about her xray, informed her she did have a small chip fracture. Told she is welcome to return for a splint placement, also that she needs to follow up with orthopedics. Pt verbalized understanding and had no further questions

## 2017-09-07 NOTE — ED Provider Notes (Signed)
[place]   [id number] 09/06/17 Arrival Time: 1950  ASSESSMENT & PLAN:  1. Wrist injury, right, initial encounter   2. Triquetral chip fracture, right, sequela     Imaging: Dg Wrist Complete Right  Result Date: 09/06/2017 CLINICAL DATA:  Right wrist injury due to a fall today. Initial encounter. EXAM: RIGHT WRIST - COMPLETE 3+ VIEW COMPARISON:  Plain films right wrist 10/22/2016. FINDINGS: A chip fracture is seen in the dorsal aspect of the wrist on the lateral view only most consistent with a triquetrum fracture. No other acute fracture is identified. The patient is status post fixation of the fifth metacarpal with plate and screws in place. IMPRESSION: Chip fracture off the dorsum of the triquetrum. No other acute abnormality. Electronically Signed   By: Inge Rise M.D.   On: 09/06/2017 20:27   Discharged before radiology report returned. She has been notified of the above results and will schedule f/u with an orthopaedist as soon as possible. She may return her for splinting.  OTC analgesics as needed.  Reviewed expectations re: course of current medical issues. Questions answered. Outlined signs and symptoms indicating need for more acute intervention. Patient verbalized understanding. After Visit Summary given.  SUBJECTIVE: History from: patient. Kylie Tucker is a 67 y.o. female who reports persistent discomfort of her right wrist with mild swelling. Onset abrupt beginning 1 hour ago. Injury/trama: yes, she reports falling and breaking her fall with her hands. Discomfort described as aching without radiation. Extremity sensation changes or weakness: none. Self treatment: has not tried OTCs for relief of pain. She is right handed.  ROS: As per HPI.   OBJECTIVE:  Vitals:   09/06/17 2004  BP: (!) 167/109  Pulse: 64  Temp: (!) 97.3 F (36.3 C)  TempSrc: Oral  SpO2: 99%    General appearance: alert; no distress Extremities: no cyanosis or edema;  symmetrical with no gross deformities; tenderness over her right wrist that is poorly localized with mild swelling and no bruising; ROM: normal but with discomfort CV: normal extremity capillary refill Skin: warm and dry Neurologic: normal gait; normal symmetric reflexes in all extremities; normal sensation in all extremities Psychological: alert and cooperative; normal mood and affect  Allergies  Allergen Reactions  . Acetaminophen Nausea Only    Other reaction(s): Other (See Comments)  . Amoxicillin-Pot Clavulanate Nausea Only    flushing  . Azithromycin     Heart palpitations   . Cefuroxime Axetil Nausea Only  . Cephalexin Nausea Only  . Clonazepam     Heart had a "weird feeling" and difficulty breathing  . Hydrocodone-Acetaminophen Hives  . Hydrocodone-Acetaminophen     Other reaction(s): GI Upset (intolerance)  . Levofloxacin Nausea Only  . Morphine And Related Other (See Comments)    Hallucinations  . Ondansetron Nausea And Vomiting  . Propoxyphene N-Acetaminophen Nausea And Vomiting  . Rosuvastatin     REACTION: myalgia  . Sulfonamide Derivatives Itching  . Tramadol Nausea And Vomiting  . Trazodone And Nefazodone Nausea Only    Felt like "I was burning all over" -patient  . Vioxx [Rofecoxib] Nausea And Vomiting  . Zofran [Ondansetron Hcl] Nausea And Vomiting  . Clarithromycin Nausea Only and Anxiety  . Codeine Itching and Rash  . Seroquel [Quetiapine Fumarate] Palpitations    Heart racing   . Sulfamethoxazole Rash    Past Medical History:  Diagnosis Date  . Allergy   . Anxiety   . Bilateral ovarian cysts    Do yearly ultrasound.  [facility]  Cancer ([place]) rt breast   2004/ chemo/ radiation  . Cellulitis and abscess of upper arm and forearm   . Family history of breast cancer   . Family history of rectal cancer   . GERD (gastroesophageal reflux disease)   . H/O Helicobacter infection 2017  . History of right mastectomy 2004   chemo/ radiation  . Hyperlipidemia     . Hypertension   . Insomnia   . Left bundle branch block 2016  . Migraines   . PONV (postoperative nausea and vomiting)    cannot take zofran  . Postmastectomy lymphedema rt side  . Sentinel node    Social History   Socioeconomic History  . Marital status: Married    Spouse name: Not on file  . Number of children: Not on file  . Years of education: Not on file  . Highest education level: Not on file  Social Needs  . Financial resource strain: Not on file  . Food insecurity - worry: Not on file  . Food insecurity - inability: Not on file  . Transportation needs - medical: Not on file  . Transportation needs - non-medical: Not on file  Occupational History  . Occupation: retired  Tobacco Use  . Smoking status: Never Smoker  . Smokeless tobacco: Never Used  Substance and Sexual Activity  . Alcohol use: No    Alcohol/week: 0.0 oz  . Drug use: No  . Sexual activity: Yes    Partners: Male    Birth control/protection: Post-menopausal  Other Topics Concern  . Not on file  Social History Narrative  . Not on file   Family History  Problem Relation Age of Onset  . Heart disease Father   . Heart disease Mother   . Hypertension Mother   . Rectal cancer Mother        dx in her early 67s  . Colon polyps Mother   . Breast cancer Maternal Aunt        dx in her early 68s  . Heart disease Paternal Uncle   . Heart disease Paternal Grandmother   . Heart disease Paternal Grandfather   . Diabetes Brother   . Breast cancer Maternal Grandmother        dx in her 33s  . Breast cancer Cousin        dx in early 16s  . Breast cancer Cousin 72  . Hepatitis C Son   . Hypertension Son   . Post-traumatic stress disorder Son    Past Surgical History:  Procedure Laterality Date  . BREAST EXCISIONAL BIOPSY Left    benign  . CARPAL TUNNEL RELEASE    . CATARACT EXTRACTION Left 07/12/2013  . MANDIBLE SURGERY  1991  . MASTECTOMY Right    malignant  . NASAL SINUS SURGERY  1990/2001  .  ORIF FINGER FRACTURE  02/14/2012   Procedure: OPEN REDUCTION INTERNAL FIXATION (ORIF) METACARPAL (FINGER) FRACTURE;  Surgeon: Wynonia Sours, MD;  Location: [place];  Service: Orthopedics;  Laterality: Right;  RIGHT FIFTH   . ORIF METACARPAL FRACTURE  8/13   rt   . PORT-A-CATH REMOVAL     in and now out  . REPAIR EXTENSOR TENDON Left 05/30/2013   Procedure: RELEASE TRANSPOSITION EXTENSOR POLLICUS LONGUS LEFT WRIST;  Surgeon: Wynonia Sours, MD;  Location: [place];  Service: Orthopedics;  Laterality: Left;  . rt mastectomy  2004   lymph nodes-7-axillary node dissection  . SHOULDER ARTHROSCOPY WITH ROTATOR CUFF  REPAIR AND SUBACROMIAL DECOMPRESSION Left 11/22/2013   Procedure: LEFT SHOULDER ARTHROSCOPY WITH SUBACROMIAL DECOMPRESSION DISTAL CLAVICLE RESECTION REPAIR  ROTATOR CUFF AS NEEDED ;  Surgeon: Cammie Sickle., MD;  Location: [place];  Service: Orthopedics;  Laterality: Left;      Vanessa Kick, MD 09/10/17 1153

## 2017-09-08 DIAGNOSIS — M25531 Pain in right wrist: Secondary | ICD-10-CM | POA: Diagnosis not present

## 2017-10-02 IMAGING — CR DG ABDOMEN 2V
2 series · 2 of 2 positions shown · non-contrast
Comparison: CT 11/18/2015.

CLINICAL DATA: Patient with history of abdominal pain and diarrhea
for 2 months.

EXAM:
ABDOMEN - 2 VIEW

[w abdomen upright]
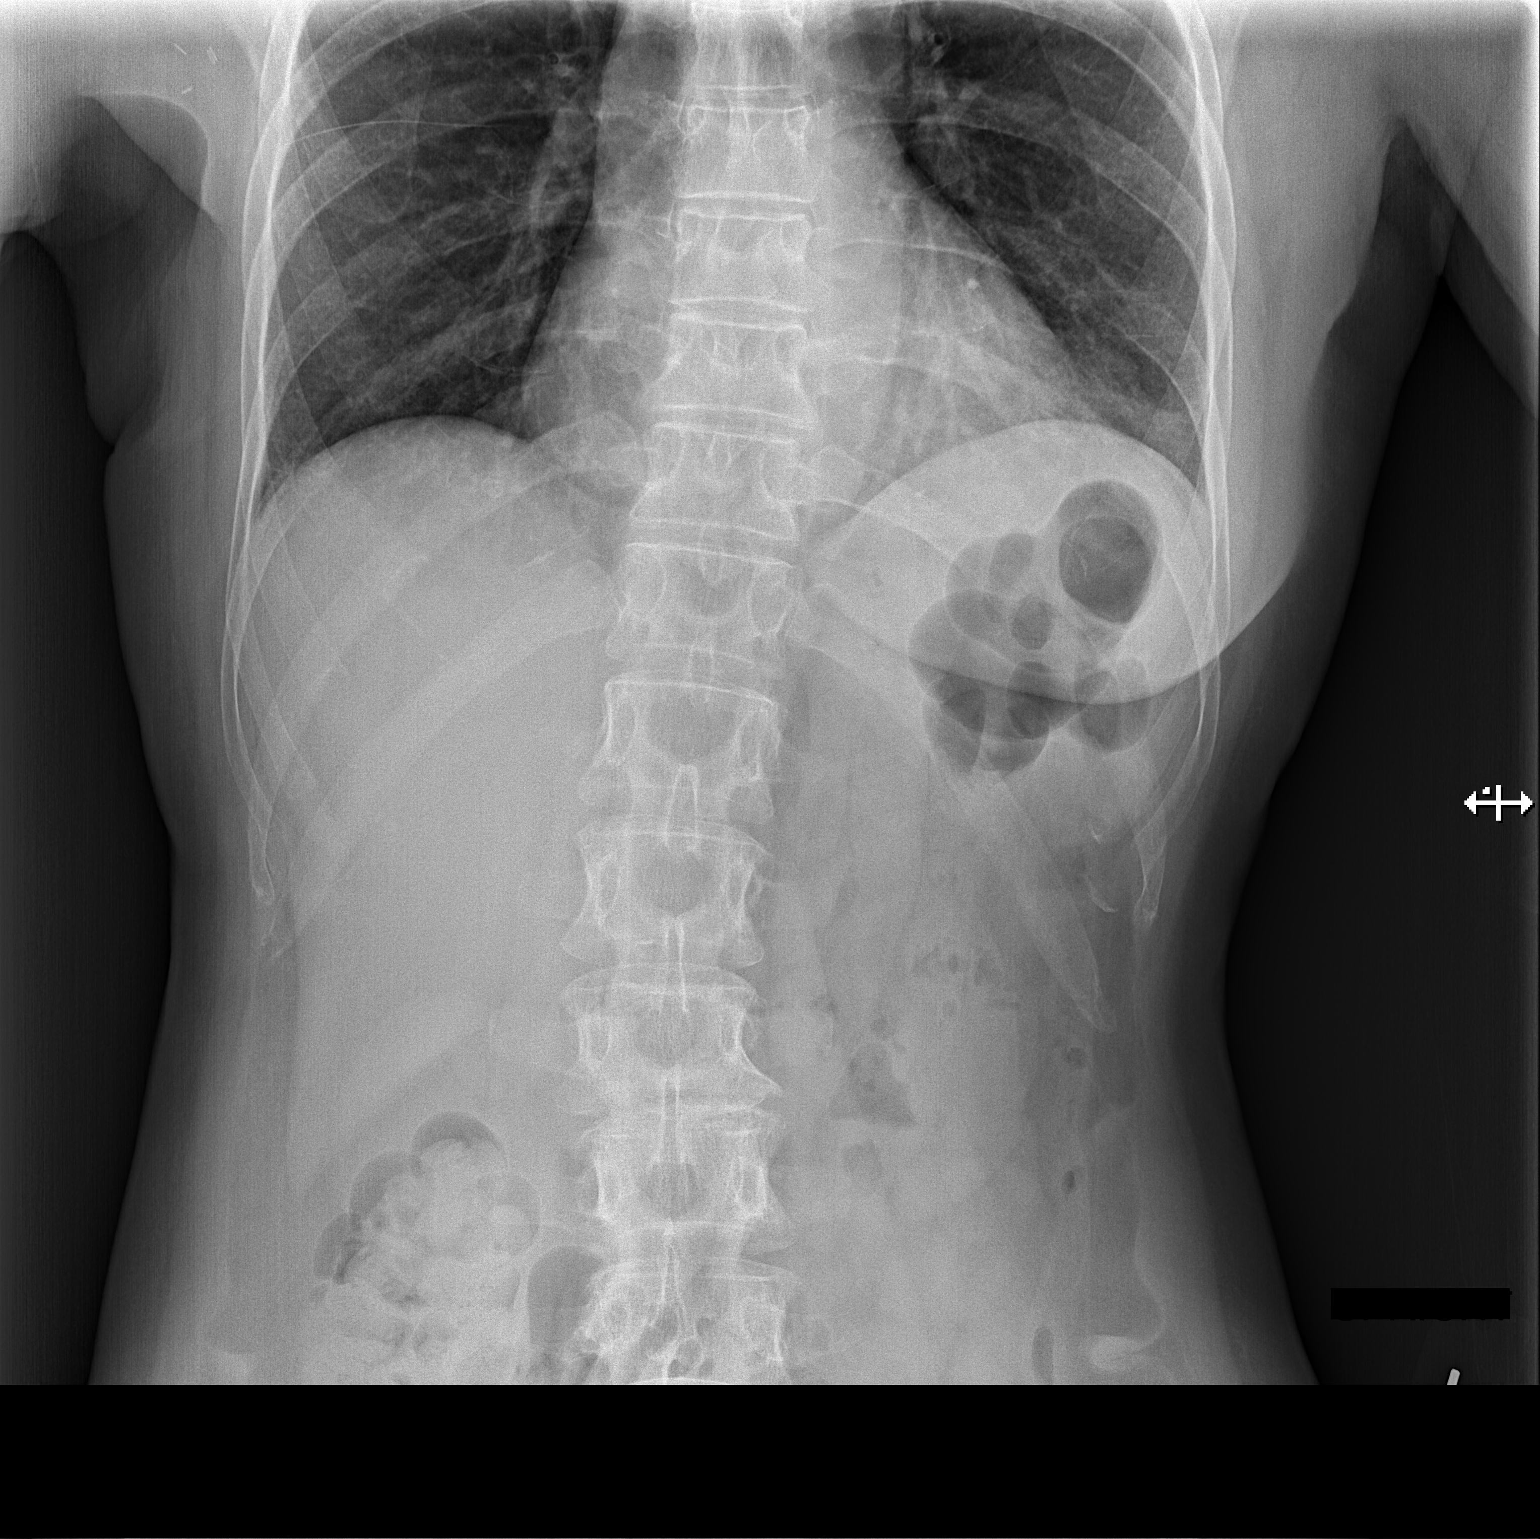

[t abdomen supine]
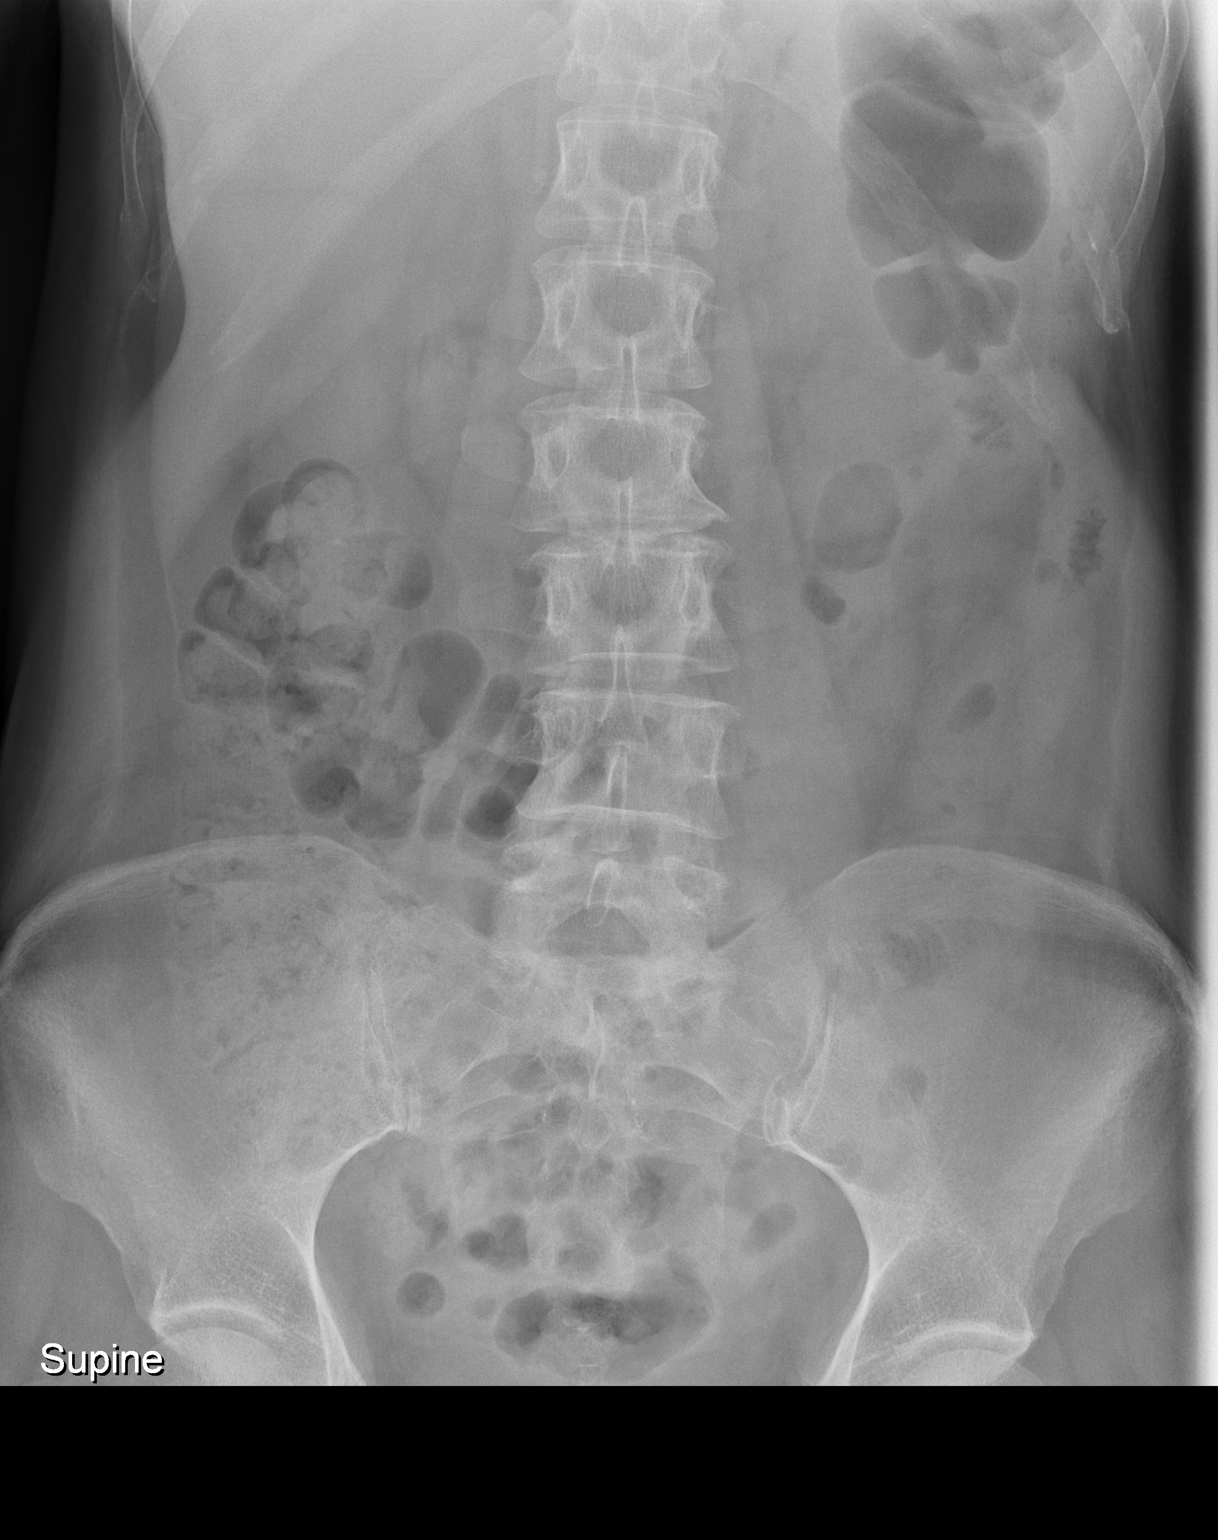

[2 of 2 positions shown; findings below may reference images not displayed]

FINDINGS: Stool is demonstrated within the cecum and ascending colon. Gas is
present within nondilated loops of large and small bowel in a
nonobstructed pattern. No evidence for pneumatosis, portal venous
gas or free intraperitoneal air. Lung bases are clear.
IMPRESSION: Nonobstructed bowel gas pattern. Stool within the cecum and
ascending colon.

## 2017-10-19 DIAGNOSIS — Z85828 Personal history of other malignant neoplasm of skin: Secondary | ICD-10-CM | POA: Diagnosis not present

## 2017-10-19 DIAGNOSIS — L245 Irritant contact dermatitis due to other chemical products: Secondary | ICD-10-CM | POA: Diagnosis not present

## 2017-11-07 DIAGNOSIS — R39198 Other difficulties with micturition: Secondary | ICD-10-CM | POA: Diagnosis not present

## 2017-11-07 DIAGNOSIS — F419 Anxiety disorder, unspecified: Secondary | ICD-10-CM | POA: Diagnosis not present

## 2017-11-07 DIAGNOSIS — H9390 Unspecified disorder of ear, unspecified ear: Secondary | ICD-10-CM | POA: Diagnosis not present

## 2017-11-07 DIAGNOSIS — Z1389 Encounter for screening for other disorder: Secondary | ICD-10-CM | POA: Diagnosis not present

## 2017-11-07 DIAGNOSIS — Z6826 Body mass index (BMI) 26.0-26.9, adult: Secondary | ICD-10-CM | POA: Diagnosis not present

## 2017-11-07 DIAGNOSIS — E663 Overweight: Secondary | ICD-10-CM | POA: Diagnosis not present

## 2017-11-29 DIAGNOSIS — Z6826 Body mass index (BMI) 26.0-26.9, adult: Secondary | ICD-10-CM | POA: Diagnosis not present

## 2017-11-29 DIAGNOSIS — E663 Overweight: Secondary | ICD-10-CM | POA: Diagnosis not present

## 2017-11-29 DIAGNOSIS — H9390 Unspecified disorder of ear, unspecified ear: Secondary | ICD-10-CM | POA: Diagnosis not present

## 2017-11-29 DIAGNOSIS — Z1389 Encounter for screening for other disorder: Secondary | ICD-10-CM | POA: Diagnosis not present

## 2017-12-06 ENCOUNTER — Emergency Department (HOSPITAL_COMMUNITY)
Admission: EM | Admit: 2017-12-06 | Discharge: 2017-12-06 | Disposition: A | Discharge status: Home / Self Care | Payer: PPO | Attending: Emergency Medicine | Admitting: Emergency Medicine

## 2017-12-06 ENCOUNTER — Encounter (HOSPITAL_COMMUNITY): Payer: Self-pay | Admitting: *Deleted

## 2017-12-06 ENCOUNTER — Emergency Department (HOSPITAL_COMMUNITY): Payer: PPO

## 2017-12-06 DIAGNOSIS — R109 Unspecified abdominal pain: Secondary | ICD-10-CM | POA: Diagnosis not present

## 2017-12-06 DIAGNOSIS — E785 Hyperlipidemia, unspecified: Secondary | ICD-10-CM | POA: Insufficient documentation

## 2017-12-06 DIAGNOSIS — I110 Hypertensive heart disease with heart failure: Secondary | ICD-10-CM | POA: Diagnosis not present

## 2017-12-06 DIAGNOSIS — Z79899 Other long term (current) drug therapy: Secondary | ICD-10-CM | POA: Diagnosis not present

## 2017-12-06 DIAGNOSIS — I5022 Chronic systolic (congestive) heart failure: Secondary | ICD-10-CM | POA: Diagnosis not present

## 2017-12-06 DIAGNOSIS — R1110 Vomiting, unspecified: Secondary | ICD-10-CM | POA: Diagnosis not present

## 2017-12-06 DIAGNOSIS — R112 Nausea with vomiting, unspecified: Secondary | ICD-10-CM | POA: Insufficient documentation

## 2017-12-06 DIAGNOSIS — R110 Nausea: Secondary | ICD-10-CM | POA: Diagnosis not present

## 2017-12-06 DIAGNOSIS — R1012 Left upper quadrant pain: Secondary | ICD-10-CM | POA: Diagnosis not present

## 2017-12-06 DIAGNOSIS — R1114 Bilious vomiting: Secondary | ICD-10-CM

## 2017-12-06 DIAGNOSIS — R197 Diarrhea, unspecified: Secondary | ICD-10-CM | POA: Diagnosis not present

## 2017-12-06 LAB — URINALYSIS, ROUTINE W REFLEX MICROSCOPIC
BILIRUBIN URINE: NEGATIVE
Glucose, UA: NEGATIVE mg/dL
HGB URINE DIPSTICK: NEGATIVE
Ketones, ur: NEGATIVE mg/dL
Leukocytes, UA: NEGATIVE
Nitrite: NEGATIVE
Protein, ur: NEGATIVE mg/dL
SPECIFIC GRAVITY, URINE: 1.017 (ref 1.005–1.030)
pH: 7 (ref 5.0–8.0)

## 2017-12-06 LAB — COMPREHENSIVE METABOLIC PANEL
ALBUMIN: 4.3 g/dL (ref 3.5–5.0)
ALK PHOS: 77 U/L (ref 38–126)
ALT: 17 U/L (ref 14–54)
ANION GAP: 11 (ref 5–15)
AST: 24 U/L (ref 15–41)
BILIRUBIN TOTAL: 0.7 mg/dL (ref 0.3–1.2)
BUN: 16 mg/dL (ref 6–20)
CALCIUM: 9.7 mg/dL (ref 8.9–10.3)
CO2: 28 mmol/L (ref 22–32)
Chloride: 99 mmol/L — ABNORMAL LOW (ref 101–111)
Creatinine, Ser: 0.9 mg/dL (ref 0.44–1.00)
GFR calc Af Amer: >60 mL/min (ref >60)
GFR calc non Af Amer: >60 mL/min (ref >60)
GLUCOSE: 151 mg/dL — AB (ref 65–99)
Potassium: 4 mmol/L (ref 3.5–5.1)
SODIUM: 138 mmol/L (ref 135–145)
Total Protein: 7.9 g/dL (ref 6.5–8.1)

## 2017-12-06 LAB — CBC
HCT: 43.2 % (ref 36.0–46.0)
HEMOGLOBIN: 14.4 g/dL (ref 12.0–15.0)
MCH: 29.9 pg (ref 26.0–34.0)
MCHC: 33.3 g/dL (ref 30.0–36.0)
MCV: 89.8 fL (ref 78.0–100.0)
Platelets: 371 10*3/uL (ref 150–400)
RBC: 4.81 MIL/uL (ref 3.87–5.11)
RDW: 13.7 % (ref 11.5–15.5)
WBC: 9.6 10*3/uL (ref 4.0–10.5)

## 2017-12-06 LAB — LIPASE, BLOOD: Lipase: 26 U/L (ref 11–51)

## 2017-12-06 MED ORDER — DICYCLOMINE HCL 10 MG/ML IM SOLN
20.0000 mg | Freq: Once | INTRAMUSCULAR | Status: AC
  Administered 2017-12-06: 20 mg via INTRAMUSCULAR
  Filled 2017-12-06: qty 2

## 2017-12-06 MED ORDER — SODIUM CHLORIDE 0.9 % IV BOLUS
1000.0000 mL | Freq: Once | INTRAVENOUS | Status: AC
Start: 2017-12-06 — End: 2017-12-06
  Administered 2017-12-06: 1000 mL via INTRAVENOUS

## 2017-12-06 MED ORDER — PROMETHAZINE HCL 25 MG/ML IJ SOLN
6.2500 mg | Freq: Once | INTRAMUSCULAR | Status: AC
  Administered 2017-12-06: 6.25 mg via INTRAVENOUS
  Filled 2017-12-06: qty 1

## 2017-12-06 MED ORDER — IOPAMIDOL (ISOVUE-300) INJECTION 61%
100.0000 mL | Freq: Once | INTRAVENOUS | Status: AC | PRN
  Administered 2017-12-06: 100 mL via INTRAVENOUS

## 2017-12-06 MED ORDER — FAMOTIDINE IN NACL 20-0.9 MG/50ML-% IV SOLN
20.0000 mg | Freq: Once | INTRAVENOUS | Status: AC
  Administered 2017-12-06: 20 mg via INTRAVENOUS
  Filled 2017-12-06: qty 50

## 2017-12-06 MED ORDER — OMEPRAZOLE 20 MG PO CPDR: 20.0000 mg | DELAYED_RELEASE_CAPSULE | Freq: Every day | ORAL | 0 refills | Status: DC

## 2017-12-06 MED ORDER — SUCRALFATE 1 G PO TABS: 1.0000 g | ORAL_TABLET | Freq: Three times a day (TID) | ORAL | 0 refills | Status: DC

## 2017-12-06 MED ORDER — FENTANYL CITRATE (PF) 100 MCG/2ML IJ SOLN
12.5000 ug | Freq: Once | INTRAMUSCULAR | Status: AC
  Administered 2017-12-06: 12.5 ug via INTRAVENOUS
  Filled 2017-12-06: qty 2

## 2017-12-06 NOTE — ED Triage Notes (Signed)
Pt with abd pain with emesis and diarrhea, recent sick contacts.

## 2017-12-06 NOTE — ED Notes (Signed)
Pt ambulated to restroom. 

## 2017-12-06 NOTE — ED Notes (Signed)
Unsuccessful stick x2. Notified Camera operator and she will get it

## 2017-12-06 NOTE — ED Notes (Signed)
Pt in CT at this time.

## 2017-12-06 NOTE — ED Provider Notes (Signed)
[facility] EMERGENCY DEPARTMENT Provider Note   CSN: [id number] Arrival date & time: 12/06/17  1453     History   Chief Complaint Chief Complaint  Patient presents with  . Abdominal Pain    HPI Kylie Tucker is a 67 y.o. female.  HPI Patient presents with her husband who assists with the HPI. Patient presents due to pain in the left upper quadrant. Pain is sore, severe with associated nausea, vomiting. Onset is unclear, but it seems as though over the past day or so the patient has had worsening pain. She acknowledges a history of H. pylori gastritis, completed antibiotics long time ago. She also has diarrhea, but no fever, and denies other complaints per It is unclear if she is taking any medication for pain relief, and she states that her symptoms are simply getting worse. Patient is very direct, offering sustained responses to all questions.  Past Medical History:  Diagnosis Date  . Allergy   . Anxiety   . Bilateral ovarian cysts    Do yearly ultrasound.  . Cancer ([place]) rt breast   2004/ chemo/ radiation  . Cellulitis and abscess of upper arm and forearm   . Family history of breast cancer   . Family history of rectal cancer   . GERD (gastroesophageal reflux disease)   . H/O Helicobacter infection 2017  . History of right mastectomy 2004   chemo/ radiation  . Hyperlipidemia   . Hypertension   . Insomnia   . Left bundle branch block 2016  . Migraines   . PONV (postoperative nausea and vomiting)    cannot take zofran  . Postmastectomy lymphedema rt side  . Sentinel node     Patient Active Problem List   Diagnosis Date Noted  . Bilateral ovarian cysts 05/20/2017  . Hyponatremia 11/28/2016  . N&V (nausea and vomiting) 11/28/2016  . Abdominal pain 11/28/2016  . Sleep talking 07/22/2016  . Genetic testing 06/01/2016  . Family history of breast cancer   . Family history of rectal cancer   . Chest pain 09/23/2015  . Abnormal vision 06/13/2015  .  Anxiety disorder 06/13/2015  . Cataract cortical, senile 06/13/2015  . Clinical depression 06/13/2015  . Cephalalgia 06/13/2015  . H/O malignant neoplasm of breast 06/13/2015  . 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 Sinus infection 06/13/2015  . Long term current use of systemic steroids 06/13/2015  . Chronic systolic CHF (congestive heart failure), NYHA class 1 ([place]) 05/09/2015  . LBBB (left bundle branch block) 03/26/2015  . Lymphedema of arm 12/13/2013  . Complete rupture of rotator cuff 11/22/2013  . Nipple discharge in female 12/01/2012  . History of breast cancer 11/30/2012  . Epiphora 11/28/2012  . Disorder of endocrine system 04/25/2012  . Ceratitis 04/25/2012  . Blepharitis 04/25/2012  . Breast cancer, right breast ([place])   . FATIGUE 10/01/2009  . 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 Dyspnea 10/01/2009  . HYPERLIPIDEMIA 09/30/2009    Past Surgical History:  Procedure Laterality Date  . BREAST EXCISIONAL BIOPSY Left    benign  . CARPAL TUNNEL RELEASE    . CATARACT EXTRACTION Left 07/12/2013  . MANDIBLE SURGERY  1991  . MASTECTOMY Right    malignant  . NASAL SINUS SURGERY  1990/2001  . ORIF FINGER FRACTURE  02/14/2012   Procedure: OPEN REDUCTION INTERNAL FIXATION (ORIF) METACARPAL (FINGER) FRACTURE;  Surgeon: Wynonia Sours, MD;  Location: [place];  Service: Orthopedics;  Laterality: Right;  RIGHT FIFTH   . ORIF METACARPAL  FRACTURE  8/13   rt   . PORT-A-CATH REMOVAL     in and now out  . REPAIR EXTENSOR TENDON Left 05/30/2013   Procedure: RELEASE TRANSPOSITION EXTENSOR POLLICUS LONGUS LEFT WRIST;  Surgeon: Wynonia Sours, MD;  Location: [place];  Service: Orthopedics;  Laterality: Left;  . rt mastectomy  2004   lymph nodes-7-axillary node dissection  . SHOULDER ARTHROSCOPY WITH ROTATOR CUFF REPAIR AND SUBACROMIAL DECOMPRESSION Left 11/22/2013   Procedure: LEFT SHOULDER ARTHROSCOPY WITH SUBACROMIAL DECOMPRESSION DISTAL CLAVICLE RESECTION REPAIR  ROTATOR  CUFF AS NEEDED ;  Surgeon: Cammie Sickle., MD;  Location: [place];  Service: Orthopedics;  Laterality: Left;     OB History    Gravida  3   Para  2   Term      Preterm      AB  1   Living  2     SAB  0   TAB  1   Ectopic      Multiple      Live Births               Home Medications    Prior to Admission medications   Medication Sig Start Date End Date Taking? Authorizing Provider  ALPRAZOLAM XR 3 MG 24 hr tablet Take 3 mg by mouth at bedtime. 06/30/16  Yes [provider]  Bacillus Coagulans-Inulin (PROBIOTIC FORMULA PO) Take 1 capsule by mouth daily.   Yes [provider]  BIOTIN PO Take 1 capsule by mouth daily.   Yes [provider]  furosemide (LASIX) 40 MG tablet Take 40 mg by mouth daily.  11/24/16  Yes [provider]  Garlic (GARLIQUE PO) Take 1 capsule by mouth daily.   Yes [provider]  hydrocortisone 2.5 % cream Apply 1 application topically daily as needed (for skin irritation).  10/19/17  Yes [provider]  metoprolol succinate (TOPROL-XL) 50 MG 24 hr tablet Take 1 tablet by mouth every evening.  08/23/16  Yes [provider]  Multiple Vitamin (MULTIVITAMIN WITH MINERALS) TABS tablet Take 1 tablet by mouth daily.   Yes [provider]    Family History Family History  Problem Relation Age of Onset  . Heart disease Father   . Heart disease Mother   . Hypertension Mother   . Rectal cancer Mother        dx in her early 56s  . Colon polyps Mother   . Breast cancer Maternal Aunt        dx in her early 20s  . Heart disease Paternal Uncle   . Heart disease Paternal Grandmother   . Heart disease Paternal Grandfather   . Diabetes Brother   . Breast cancer Maternal Grandmother        dx in her 59s  . Breast cancer Cousin        dx in early 74s  . Breast cancer Cousin 64  . Hepatitis C Son   . Hypertension Son   . Post-traumatic stress disorder Son       Social History Social History   Tobacco Use  . Smoking status: Never Smoker  . Smokeless tobacco: Never Used  Substance Use Topics  . Alcohol use: No    Alcohol/week: 0.0 oz  . Drug use: No     Allergies   Acetaminophen; Amoxicillin-pot clavulanate; Azithromycin; Cefuroxime axetil; Cephalexin; Clonazepam; Hydrocodone-acetaminophen; Hydrocodone-acetaminophen; Levofloxacin; Milk-related compounds; Morphine and related; Ondansetron; Propoxyphene n-acetaminophen; Rosuvastatin; Sulfonamide  derivatives; Tramadol; Trazodone and nefazodone; Vioxx [rofecoxib]; Zofran [ondansetron hcl]; Clarithromycin; Codeine; Seroquel [quetiapine fumarate]; and Sulfamethoxazole   Review of Systems Review of Systems  Constitutional:       Per HPI, otherwise negative  HENT:       Per HPI, otherwise negative  Respiratory:       Per HPI, otherwise negative  Cardiovascular:       Per HPI, otherwise negative  Gastrointestinal: Positive for abdominal pain, diarrhea, nausea and vomiting.  Endocrine:       Negative aside from HPI  Genitourinary:       Neg aside from HPI   Musculoskeletal:       Per HPI, otherwise negative  Skin: Negative.   Neurological: Negative for syncope.     Physical Exam Updated Vital Signs BP (!) 163/78 (BP Location: Left Arm)   Pulse 88   Temp 98.5 F (36.9 C) (Oral)   Resp 14   Ht 5\' 2"  (1.575 m)   Wt 65.8 kg (145 lb)   LMP 01/10/2003   SpO2 95%   BMI 26.52 kg/m   Physical Exam  Constitutional: She is oriented to person, place, and time. She appears well-developed and well-nourished. No distress.  HENT:  Head: Normocephalic and atraumatic.  Eyes: Conjunctivae and EOM are normal.  Cardiovascular: Normal rate and regular rhythm.  Pulmonary/Chest: Effort normal and breath sounds normal. No stridor. No respiratory distress.  Abdominal: She exhibits no distension. There is tenderness in the left upper quadrant.  Musculoskeletal: She exhibits no edema.   Neurological: She is alert and oriented to person, place, and time. No cranial nerve deficit.  Skin: Skin is warm and dry.  Psychiatric: Her affect is blunt.  Nursing note and vitals reviewed.    ED Treatments / Results  Labs (all labs ordered are listed, but only abnormal results are displayed) Labs Reviewed  COMPREHENSIVE METABOLIC PANEL - Abnormal; Notable for the following components:      Result Value   Chloride 99 (*)    Glucose, Bld 151 (*)    All other components within normal limits  URINALYSIS, ROUTINE W REFLEX MICROSCOPIC - Abnormal; Notable for the following components:   APPearance HAZY (*)    All other components within normal limits  LIPASE, BLOOD  CBC    EKG None  Radiology Ct Abdomen Pelvis W Contrast  Result Date: 12/06/2017 CLINICAL DATA:  Left upper abdominal pain, vomiting, diarrhea x2 days EXAM: CT ABDOMEN AND PELVIS WITH CONTRAST TECHNIQUE: Multidetector CT imaging of the abdomen and pelvis was performed using the standard protocol following bolus administration of intravenous contrast. CONTRAST:  111mL ISOVUE-300 IOPAMIDOL (ISOVUE-300) INJECTION 61% COMPARISON:  12/19/2015 FINDINGS: Lower chest: Mild left lower lobe scarring/atelectasis. Hepatobiliary: Scattered hepatic cysts measuring up to 1.4 cm in the central right hepatic dome. Gallbladder is unremarkable. No intrahepatic or extrahepatic ductal dilatation. Pancreas: Within normal limits. Spleen: Within normal limits. Adrenals/Urinary Tract: 12 mm left adrenal nodule, unchanged, likely reflecting a benign adrenal adenoma. Right adrenal gland is within normal limits. 5 mm nonobstructing right lower pole renal calculus (series 2/image 38). Small bilateral renal cysts, measuring up to 5 mm on the right. No hydronephrosis. Bladder is mildly thick-walled anteriorly although underdistended. Stomach/Bowel: Stomach is notable for a tiny hiatal hernia. No evidence of bowel obstruction. Normal appendix (series 2/image  63). Mild sigmoid diverticulosis, without evidence of diverticulitis. Mild rectal stool burden. Vascular/Lymphatic: No evidence of abdominal aortic aneurysm. Atherosclerotic calcifications of the abdominal aorta and branch vessels. No suspicious  abdominopelvic lymphadenopathy. Reproductive: Uterus is within normal limits. Left ovarian cystic lesions measuring up to 2.4 cm. 4.4 x 3.4 cm right ovarian cystic lesion (series 2/image 73), with septations on prior ultrasound, grossly unchanged in size. Other: No abdominopelvic ascites. Musculoskeletal: Mild degenerative changes at L2-3 and L5-S1. Prominent superior endplate Schmorl's node deformity at T12. IMPRESSION: Mild sigmoid diverticulosis, without evidence of diverticulitis. No evidence of bowel obstruction. Normal appendix. 5 mm nonobstructing right lower pole renal calculus. No ureteral or bladder calculi. No hydronephrosis. 4.4 cm complex/septated right ovarian cystic lesion, chronic. Annual pelvic ultrasound is suggested in this postmenopausal female. Additional stable ancillary findings as above. Electronically Signed   By: Julian Hy M.D.   On: 12/06/2017 21:33    Procedures Procedures (including critical care time)  Medications Ordered in ED Medications  famotidine (PEPCID) IVPB 20 mg premix (0 mg Intravenous Stopped 12/06/17 1856)  promethazine (PHENERGAN) injection 6.25 mg (6.25 mg Intravenous Given 12/06/17 1828)  sodium chloride 0.9 % bolus 1,000 mL (0 mLs Intravenous Stopped 12/06/17 1942)  dicyclomine (BENTYL) injection 20 mg (20 mg Intramuscular Given 12/06/17 1954)  fentaNYL (SUBLIMAZE) injection 12.5 mcg (12.5 mcg Intravenous Given 12/06/17 1954)  iopamidol (ISOVUE-300) 61 % injection 100 mL (100 mLs Intravenous Contrast Given 12/06/17 2111)     Initial Impression / Assessment and Plan / ED Course  I have reviewed the triage vital signs and the nursing notes.  Pertinent labs & imaging results that were available during my care  of the patient were reviewed by me and considered in my medical decision making (see chart for details).     9:52 PM On repeat exam the patient is awake and alert. We discussed today's reassuring CT scan, labs. Given her improvement here, the patient will be discharged to follow-up with her gastroenterologist. Absent evidence for peritonitis, bacteremia, sepsis, no indication for antibiotics, suspicion for gastric or esophageal etiology.   Final Clinical Impressions(s) / ED Diagnoses  Abdominal pain Nausea and vomiting   Carmin Muskrat, MD 12/06/17 2152

## 2017-12-06 NOTE — Discharge Instructions (Signed)
As discussed, your evaluation today has been largely reassuring.  But, it is important that you monitor your condition carefully, and do not hesitate to return to the ED if you develop new, or concerning changes in your condition. ? ?Otherwise, please follow-up with your physician for appropriate ongoing care. ? ?

## 2017-12-09 DIAGNOSIS — G43A0 Cyclical vomiting, not intractable: Secondary | ICD-10-CM | POA: Diagnosis not present

## 2017-12-09 DIAGNOSIS — R1012 Left upper quadrant pain: Secondary | ICD-10-CM | POA: Diagnosis not present

## 2017-12-12 DIAGNOSIS — I427 Cardiomyopathy due to drug and external agent: Secondary | ICD-10-CM | POA: Diagnosis not present

## 2017-12-12 DIAGNOSIS — T451X5A Adverse effect of antineoplastic and immunosuppressive drugs, initial encounter: Secondary | ICD-10-CM | POA: Diagnosis not present

## 2017-12-12 DIAGNOSIS — I428 Other cardiomyopathies: Secondary | ICD-10-CM | POA: Diagnosis not present

## 2017-12-12 DIAGNOSIS — I447 Left bundle-branch block, unspecified: Secondary | ICD-10-CM | POA: Diagnosis not present

## 2017-12-14 DIAGNOSIS — Z85828 Personal history of other malignant neoplasm of skin: Secondary | ICD-10-CM | POA: Diagnosis not present

## 2017-12-14 DIAGNOSIS — D1801 Hemangioma of skin and subcutaneous tissue: Secondary | ICD-10-CM | POA: Diagnosis not present

## 2017-12-14 DIAGNOSIS — L738 Other specified follicular disorders: Secondary | ICD-10-CM | POA: Diagnosis not present

## 2018-04-05 ENCOUNTER — Emergency Department (HOSPITAL_COMMUNITY): Payer: PPO

## 2018-04-05 ENCOUNTER — Observation Stay (HOSPITAL_COMMUNITY)
Admission: EM | Admit: 2018-04-05 | Discharge: 2018-04-06 | Disposition: A | Discharge status: Home / Self Care | Payer: PPO | Attending: Internal Medicine | Admitting: Internal Medicine

## 2018-04-05 ENCOUNTER — Encounter (HOSPITAL_COMMUNITY): Payer: Self-pay | Admitting: *Deleted

## 2018-04-05 DIAGNOSIS — I10 Essential (primary) hypertension: Secondary | ICD-10-CM | POA: Diagnosis not present

## 2018-04-05 DIAGNOSIS — R7989 Other specified abnormal findings of blood chemistry: Secondary | ICD-10-CM | POA: Diagnosis not present

## 2018-04-05 DIAGNOSIS — F331 Major depressive disorder, recurrent, moderate: Secondary | ICD-10-CM | POA: Insufficient documentation

## 2018-04-05 DIAGNOSIS — F29 Unspecified psychosis not due to a substance or known physiological condition: Secondary | ICD-10-CM

## 2018-04-05 DIAGNOSIS — I447 Left bundle-branch block, unspecified: Secondary | ICD-10-CM | POA: Diagnosis present

## 2018-04-05 DIAGNOSIS — R456 Violent behavior: Secondary | ICD-10-CM | POA: Diagnosis not present

## 2018-04-05 DIAGNOSIS — F419 Anxiety disorder, unspecified: Secondary | ICD-10-CM | POA: Diagnosis present

## 2018-04-05 DIAGNOSIS — R451 Restlessness and agitation: Secondary | ICD-10-CM

## 2018-04-05 DIAGNOSIS — Z79899 Other long term (current) drug therapy: Secondary | ICD-10-CM | POA: Diagnosis not present

## 2018-04-05 DIAGNOSIS — R410 Disorientation, unspecified: Secondary | ICD-10-CM | POA: Diagnosis not present

## 2018-04-05 DIAGNOSIS — R778 Other specified abnormalities of plasma proteins: Secondary | ICD-10-CM

## 2018-04-05 DIAGNOSIS — R443 Hallucinations, unspecified: Secondary | ICD-10-CM

## 2018-04-05 DIAGNOSIS — R4689 Other symptoms and signs involving appearance and behavior: Secondary | ICD-10-CM

## 2018-04-05 HISTORY — DX: Other cardiomyopathies: I42.8

## 2018-04-05 HISTORY — DX: Chronic combined systolic (congestive) and diastolic (congestive) heart failure: I50.42

## 2018-04-05 HISTORY — DX: Nonrheumatic mitral (valve) insufficiency: I34.0

## 2018-04-05 LAB — CBC WITH DIFFERENTIAL/PLATELET
Basophils Absolute: 0 10*3/uL (ref 0.0–0.1)
Basophils Relative: 0 %
EOS PCT: 1 %
Eosinophils Absolute: 0.1 10*3/uL (ref 0.0–0.7)
HCT: 40.5 % (ref 36.0–46.0)
HEMOGLOBIN: 13.3 g/dL (ref 12.0–15.0)
LYMPHS PCT: 16 %
Lymphs Abs: 1.6 10*3/uL (ref 0.7–4.0)
MCH: 29.6 pg (ref 26.0–34.0)
MCHC: 32.8 g/dL (ref 30.0–36.0)
MCV: 90 fL (ref 78.0–100.0)
MONOS PCT: 6 %
Monocytes Absolute: 0.7 10*3/uL (ref 0.1–1.0)
NEUTROS PCT: 77 %
Neutro Abs: 8.2 10*3/uL — ABNORMAL HIGH (ref 1.7–7.7)
Platelets: 281 10*3/uL (ref 150–400)
RBC: 4.5 MIL/uL (ref 3.87–5.11)
RDW: 14.6 % (ref 11.5–15.5)
WBC: 10.6 10*3/uL — AB (ref 4.0–10.5)

## 2018-04-05 LAB — COMPREHENSIVE METABOLIC PANEL
ALBUMIN: 4.1 g/dL (ref 3.5–5.0)
ALT: 20 U/L (ref 0–44)
AST: 26 U/L (ref 15–41)
Alkaline Phosphatase: 71 U/L (ref 38–126)
Anion gap: 8 (ref 5–15)
BUN: 19 mg/dL (ref 8–23)
CO2: 25 mmol/L (ref 22–32)
CREATININE: 0.99 mg/dL (ref 0.44–1.00)
Calcium: 9.2 mg/dL (ref 8.9–10.3)
Chloride: 107 mmol/L (ref 98–111)
GFR calc Af Amer: >60 mL/min (ref >60)
GFR calc non Af Amer: 58 mL/min — ABNORMAL LOW (ref >60)
GLUCOSE: 119 mg/dL — AB (ref 70–99)
POTASSIUM: 4 mmol/L (ref 3.5–5.1)
Sodium: 140 mmol/L (ref 135–145)
Total Bilirubin: 0.6 mg/dL (ref 0.3–1.2)
Total Protein: 7.2 g/dL (ref 6.5–8.1)

## 2018-04-05 LAB — URINALYSIS, ROUTINE W REFLEX MICROSCOPIC
BILIRUBIN URINE: NEGATIVE
Glucose, UA: NEGATIVE mg/dL
Hgb urine dipstick: NEGATIVE
KETONES UR: NEGATIVE mg/dL
LEUKOCYTES UA: NEGATIVE
NITRITE: NEGATIVE
PROTEIN: NEGATIVE mg/dL
Specific Gravity, Urine: 1.016 (ref 1.005–1.030)
pH: 9 — ABNORMAL HIGH (ref 5.0–8.0)

## 2018-04-05 LAB — PROTIME-INR
INR: 0.87
Prothrombin Time: 11.8 seconds (ref 11.4–15.2)

## 2018-04-05 LAB — RAPID URINE DRUG SCREEN, HOSP PERFORMED
Amphetamines: NOT DETECTED
BENZODIAZEPINES: NOT DETECTED
Barbiturates: NOT DETECTED
COCAINE: NOT DETECTED
OPIATES: NOT DETECTED
Tetrahydrocannabinol: NOT DETECTED

## 2018-04-05 LAB — SALICYLATE LEVEL: Salicylate Lvl: <7 mg/dL (ref 2.8–30.0)

## 2018-04-05 LAB — AMMONIA: Ammonia: 22 umol/L (ref 9–35)

## 2018-04-05 LAB — ACETAMINOPHEN LEVEL: Acetaminophen (Tylenol), Serum: <10 ug/mL — ABNORMAL LOW (ref 10–30)

## 2018-04-05 LAB — TROPONIN I: Troponin I: 0.11 ng/mL (ref <0.03)

## 2018-04-05 LAB — ETHANOL: Alcohol, Ethyl (B): <10 mg/dL (ref <10)

## 2018-04-05 MED ORDER — ASPIRIN 81 MG PO CHEW
324.0000 mg | CHEWABLE_TABLET | Freq: Once | ORAL | Status: DC
  Filled 2018-04-05: qty 4
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
## 2018-04-05 NOTE — ED Notes (Signed)
Security wanded Pt and Pt changed into maroon hospital scrubs. Pt belongings secured in locker and include necklace, bracelet, pair of earrings, shirt, shorts, and shoes.
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
## 2018-04-05 NOTE — ED Notes (Signed)
Date and time results received: 04/05/18 2127  Test: Troponin Critical Value: 0.11  Name of Provider Notified: Thurnell Garbe, MD

## 2018-04-06 ENCOUNTER — Other Ambulatory Visit: Payer: Self-pay

## 2018-04-06 ENCOUNTER — Observation Stay (HOSPITAL_BASED_OUTPATIENT_CLINIC_OR_DEPARTMENT_OTHER): Payer: PPO

## 2018-04-06 ENCOUNTER — Other Ambulatory Visit (HOSPITAL_COMMUNITY): Payer: PPO

## 2018-04-06 ENCOUNTER — Encounter (HOSPITAL_COMMUNITY): Payer: Self-pay | Admitting: *Deleted

## 2018-04-06 ENCOUNTER — Other Ambulatory Visit (HOSPITAL_COMMUNITY): Payer: Self-pay | Admitting: *Deleted

## 2018-04-06 DIAGNOSIS — I10 Essential (primary) hypertension: Secondary | ICD-10-CM | POA: Diagnosis not present

## 2018-04-06 DIAGNOSIS — R443 Hallucinations, unspecified: Secondary | ICD-10-CM | POA: Diagnosis not present

## 2018-04-06 DIAGNOSIS — R7989 Other specified abnormal findings of blood chemistry: Secondary | ICD-10-CM

## 2018-04-06 DIAGNOSIS — F419 Anxiety disorder, unspecified: Secondary | ICD-10-CM

## 2018-04-06 DIAGNOSIS — R748 Abnormal levels of other serum enzymes: Secondary | ICD-10-CM | POA: Diagnosis not present

## 2018-04-06 DIAGNOSIS — I5042 Chronic combined systolic (congestive) and diastolic (congestive) heart failure: Secondary | ICD-10-CM

## 2018-04-06 DIAGNOSIS — R778 Other specified abnormalities of plasma proteins: Secondary | ICD-10-CM

## 2018-04-06 DIAGNOSIS — R451 Restlessness and agitation: Secondary | ICD-10-CM | POA: Diagnosis not present

## 2018-04-06 DIAGNOSIS — I340 Nonrheumatic mitral (valve) insufficiency: Secondary | ICD-10-CM | POA: Diagnosis not present

## 2018-04-06 DIAGNOSIS — I447 Left bundle-branch block, unspecified: Secondary | ICD-10-CM | POA: Diagnosis not present

## 2018-04-06 DIAGNOSIS — I361 Nonrheumatic tricuspid (valve) insufficiency: Secondary | ICD-10-CM | POA: Diagnosis not present

## 2018-04-06 LAB — TSH: TSH: 4.312 u[IU]/mL (ref 0.350–4.500)

## 2018-04-06 LAB — ECHOCARDIOGRAM COMPLETE
Height: 62.25 in
Weight: 2400 oz

## 2018-04-06 LAB — TROPONIN I
TROPONIN I: 0.1 ng/mL — AB (ref <0.03)
Troponin I: 0.13 ng/mL (ref <0.03)

## 2018-04-06 MED ORDER — METOPROLOL SUCCINATE ER 50 MG PO TB24
50.0000 mg | ORAL_TABLET | Freq: Every evening | ORAL | Status: DC
  Administered 2018-04-06: 50 mg via ORAL
  Filled 2018-04-06: qty 1

## 2018-04-06 MED ORDER — FUROSEMIDE 40 MG PO TABS
40.0000 mg | ORAL_TABLET | Freq: Every day | ORAL | Status: DC
  Administered 2018-04-06: 40 mg via ORAL
  Filled 2018-04-06: qty 1

## 2018-04-06 MED ORDER — PROMETHAZINE HCL 25 MG/ML IJ SOLN
12.5000 mg | Freq: Four times a day (QID) | INTRAMUSCULAR | Status: DC | PRN
  Administered 2018-04-06: 12.5 mg via INTRAVENOUS
  Filled 2018-04-06: qty 1

## 2018-04-06 MED ORDER — ALPRAZOLAM ER 1 MG PO TB24: 3.0000 mg | ORAL_TABLET | Freq: Every day | ORAL | Status: DC

## 2018-04-06 MED ORDER — ASPIRIN 325 MG PO TABS: 325.0000 mg | ORAL_TABLET | Freq: Every day | ORAL | Status: DC

## 2018-04-06 NOTE — ED Notes (Signed)
RN attempted to establish IV access and Pt refused to allow RN to attempt IV.

## 2018-04-06 NOTE — Progress Notes (Signed)
Pt's IV catheter removed and intact. Pt's IV site clean dry and intact. Discharge instructions including medications and follow up appointments were reviewed and discussed with patient. All questions were answered and no further questions at this time. Pt in stable condition and in no acute distress at time of discharge. Pt escorted by nurse tech 
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
## 2018-04-06 NOTE — ED Notes (Signed)
Date and time results received: 04/06/18 0012  Test: Troponin Critical Value: 0.13  Name of Provider Notified: Thurnell Garbe, MD
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
## 2018-04-06 NOTE — Consult Note (Addendum)
Cardiology Consultation:   Patient ID: Kylie Tucker; [id number]; 1950-11-03   Admit date: 04/05/2018 Date of Consult: 04/06/2018  Primary Care Provider: Redmond School, MD Primary Cardiologist: Mertie Moores, MD  Chief Complaint: confusion, combative  Patient Profile:   Kylie Tucker is a 67 y.o. 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 Thurnell Garbe.  History of Present Illness:   She was remotely followed by Dr. Acie Fredrickson who references remote normal cath that I do not have access to. Echo in 2011 showed normal LVEF but echo in 2016 showed EF 40-45%, grade 2 DD, moderate MR. Nuclear stress test done same time 03/2015 showed EF 54%, no evidence of prior infarct or ischemia. Event monitor in 2016 showed NSR to sinus tach 70-120bpm without otherwise significant arrhythmias. She has not been seen in our office since 2017. It seems like she was previously sensitive to medication so Coreg was d/c'd due to side effects. She says she follows with Dr. Einar Gip but cannot recall time of last visit. She denies any interim testing. She was brought in to the ER under IVF by her husband who reported she had been talking to herself, not going to medical appointments, and exhibiting signs of combativeness. For unclear reasons, troponins were checked and were low and flat without crescendo pattern (0.11-0.13-0.10). The patient refused ASA and IV.  Otherwise labs show glucose 119, WBC 10.6, negative UDS, TSH wnl, negative ETOH. Blood pressure has been elevated up to 188/88. CT head nonacute. CXR no active disease, stable mild-mod cardiomegaly. EKG shows chronic LBBB and telemetry is unremarkable.  The patient is by herself in the room with sitter. When asked if she knows why she's here, she says her husband  brought papers because they've been fussing and fighting. Her affect is very brief and abrupt, almost rushed in tone of voice. Her thought content seems normal. She adamantly denies any CP, SOB, LEE, orthopnea, palpitations or syncope. Volume status looks normal.  Past Medical History:  Diagnosis Date  . Allergy   . Anxiety   . Bilateral ovarian cysts    Do yearly ultrasound.  . Cancer ([place]) rt breast   2004/ chemo/ radiation  . Cellulitis and abscess of upper arm and forearm   . Family history of breast cancer   . Family history of rectal cancer   . GERD (gastroesophageal reflux disease)   . H/O Helicobacter infection 2017  . History of right mastectomy 2004   chemo/ radiation  . Hyperlipidemia   . Hypertension   . Insomnia   . Left bundle branch block 2016  . Migraines   . PONV (postoperative nausea and vomiting)    cannot take zofran  . Postmastectomy lymphedema rt side  . Sentinel node     Past Surgical History:  Procedure Laterality Date  . BREAST EXCISIONAL BIOPSY Left    benign  . CARPAL TUNNEL RELEASE    . CATARACT EXTRACTION Left 07/12/2013  . MANDIBLE SURGERY  1991  . MASTECTOMY Right    malignant  . NASAL SINUS SURGERY  1990/2001  . ORIF FINGER FRACTURE  02/14/2012   Procedure: OPEN REDUCTION INTERNAL FIXATION (ORIF) METACARPAL (FINGER) FRACTURE;  Surgeon: Wynonia Sours, MD;  Location: [facility];  Service: Orthopedics;  Laterality: Right;  RIGHT FIFTH   . ORIF  METACARPAL FRACTURE  8/13   rt   . PORT-A-CATH REMOVAL     in and now out  . REPAIR EXTENSOR TENDON Left 05/30/2013   Procedure: RELEASE TRANSPOSITION EXTENSOR POLLICUS LONGUS LEFT WRIST;  Surgeon: Wynonia Sours, MD;  Location: [facility];  Service: Orthopedics;  Laterality: Left;  . rt mastectomy  2004   lymph nodes-7-axillary node dissection  . SHOULDER ARTHROSCOPY WITH ROTATOR CUFF REPAIR AND SUBACROMIAL DECOMPRESSION Left 11/22/2013   Procedure: LEFT SHOULDER ARTHROSCOPY  WITH SUBACROMIAL DECOMPRESSION DISTAL CLAVICLE RESECTION REPAIR  ROTATOR CUFF AS NEEDED ;  Surgeon: Cammie Sickle., MD;  Location: [facility];  Service: Orthopedics;  Laterality: Left;     Inpatient Medications: Scheduled Meds: . ALPRAZolam  3 mg Oral QHS  . aspirin  324 mg Oral Once  . [START ON 04/07/2018] aspirin  325 mg Oral Daily  . furosemide  40 mg Oral Daily  . metoprolol succinate  50 mg Oral QPM   Continuous Infusions:  PRN Meds:   Home Meds: Prior to Admission medications   Medication Sig Start Date End Date Taking? Authorizing Provider  ALPRAZOLAM XR 3 MG 24 hr tablet Take 3 mg by mouth at bedtime. 06/30/16  Yes [provider]  Bacillus Coagulans-Inulin (PROBIOTIC FORMULA PO) Take 1 capsule by mouth daily.   Yes [provider]  BIOTIN PO Take 1 capsule by mouth daily.   Yes [provider]  furosemide (LASIX) 40 MG tablet Take 40 mg by mouth daily.  11/24/16  Yes [provider]  Garlic (GARLIQUE PO) Take 1 capsule by mouth daily.   Yes [provider]  hydrocortisone 2.5 % cream Apply 1 application topically daily as needed (for skin irritation).  10/19/17  Yes [provider]  metoprolol succinate (TOPROL-XL) 50 MG 24 hr tablet Take 1 tablet by mouth every evening.  08/23/16  Yes [provider]  Multiple Vitamin (MULTIVITAMIN WITH MINERALS) TABS tablet Take 1 tablet by mouth daily.   Yes [provider]  omeprazole (PRILOSEC) 20 MG capsule Take 1 capsule (20 mg total) by mouth daily. Take one tablet daily 12/06/17  Yes Carmin Muskrat, MD  sucralfate (CARAFATE) 1 g tablet Take 1 tablet (1 g total) by mouth 4 (four) times daily -  with meals and at bedtime. 12/06/17  Yes Carmin Muskrat, MD    Allergies:    Allergies  Allergen Reactions  . Acetaminophen Nausea Only    Other reaction(s): Other (See Comments)  . Amoxicillin-Pot Clavulanate Nausea Only    flushing  . Aspirin      Gastrointestinal upset  . Azithromycin     Heart palpitations   . Cefuroxime Axetil Nausea Only  . Cephalexin Nausea Only  . Clonazepam     Heart had a "weird feeling" and difficulty breathing  . Hydrocodone-Acetaminophen Hives  . Hydrocodone-Acetaminophen     Other reaction(s): GI Upset (intolerance)  . Levofloxacin Nausea Only  . Milk-Related Compounds Nausea And Vomiting  . Morphine And Related Other (See Comments)    Hallucinations  . Ondansetron Nausea And Vomiting  . Propoxyphene N-Acetaminophen Nausea And Vomiting  . Rosuvastatin     REACTION: myalgia  . Sulfonamide Derivatives Itching  . Tramadol Nausea And Vomiting  . Trazodone And Nefazodone Nausea Only    Felt like "I was burning all over" -patient  . Vioxx [Rofecoxib] Nausea And Vomiting  . Zofran [Ondansetron Hcl] Nausea And Vomiting  . Clarithromycin Nausea Only and Anxiety  .  Codeine Itching and Rash  . Seroquel [Quetiapine Fumarate] Palpitations    Heart racing   . Sulfamethoxazole Rash    Social History:   Social History   Socioeconomic History  . Marital status: Married    Spouse name: Not on file  . Number of children: Not on file  . Years of education: Not on file  . Highest education level: Not on file  Occupational History  . Occupation: retired  Scientific laboratory technician  . Financial resource strain: Patient refused  . Food insecurity:    Worry: Patient refused    Inability: Patient refused  . Transportation needs:    Medical: Patient refused    Non-medical: Patient refused  Tobacco Use  . Smoking status: Never Smoker  . Smokeless tobacco: Never Used  Substance and Sexual Activity  . Alcohol use: No    Alcohol/week: 0.0 standard drinks  . Drug use: No  . Sexual activity: Yes    Partners: Male    Birth control/protection: Post-menopausal  Lifestyle  . Physical activity:    Days per week: Patient refused    Minutes per session: Patient refused  . Stress: Patient refused  Relationships  .  Social connections:    Talks on phone: Patient refused    Gets together: Patient refused    Attends religious service: Patient refused    Active member of club or organization: Patient refused    Attends meetings of clubs or organizations: Patient refused    Relationship status: Patient refused  . Intimate partner violence:    Fear of current or ex partner: Patient refused    Emotionally abused: Patient refused    Physically abused: Patient refused    Forced sexual activity: Patient refused  Other Topics Concern  . Not on file  Social History Narrative  . Not on file    Family History:   The patient's family history includes Breast cancer in her cousin, maternal aunt, and maternal grandmother; Breast cancer (age of onset: 30) in her cousin; Colon polyps in her mother; Diabetes in her brother; Heart disease in her father, mother, paternal grandfather, paternal grandmother, and paternal uncle; Hepatitis C in her son; Hypertension in her mother and son; Post-traumatic stress disorder in her son; Rectal cancer in her mother.  ROS:  Please see the history of present illness.  All other ROS reviewed and negative.     Physical Exam/Data:   Vitals:   04/05/18 2335 04/06/18 0000 04/06/18 0030 04/06/18 0144  BP: (!) 182/84 (!) 177/78 (!) 165/73 (!) 188/88  Pulse: 80 87  90  Resp: 18 (!) 26 (!) 24 16  Temp:    98.9 F (37.2 C)  TempSrc:    Oral  SpO2: 95% 98%  96%  Weight:      Height:       No intake or output data in the 24 hours ending 04/06/18 0828 Filed Weights   04/05/18 1938  Weight: 68 kg   Body mass index is 27.22 kg/m.  General: Well developed, well nourished WF, in no acute distress. Head: Normocephalic, atraumatic, sclera non-icteric, no xanthomas, nares are without discharge.  Neck: Negative for carotid bruits. JVD not elevated. Lungs: Clear bilaterally to auscultation without wheezes, rales, or rhonchi. Breathing is unlabored. Heart: RRR with S1 S2. Split 2nd heart  sound. No rubs or gallops appreciated. Abdomen: Soft, non-tender, non-distended with normoactive bowel sounds. No hepatomegaly. No rebound/guarding. No obvious abdominal masses. Msk:  Strength and tone appear normal for age. Extremities: No  clubbing or cyanosis. No edema.  Distal pedal pulses are 2+ and equal bilaterally. Neuro: Alert and oriented X 3. No facial asymmetry. No focal deficit. Moves all extremities spontaneously. Psych:  Responds to questions appropriately with a blunt, rushed affect.  EKG:  The EKG was personally reviewed and demonstrates NSR LBBB without acute change from prior.  Relevant CV Studies: 2D echo 03/2015 Study Conclusions - Left ventricle: The cavity size was normal. Systolic function was   mildly to moderately reduced. The estimated ejection fraction was   in the range of 40% to 45%. Moderate diffuse hypokinesis with no   identifiable regional variations. Features are consistent with a   pseudonormal left ventricular filling pattern, with concomitant   abnormal relaxation and increased filling pressure (grade 2   diastolic dysfunction). - Ventricular septum: Septal motion showed paradox. These changes   are consistent with a left bundle branch block. - Mitral valve: There was moderate regurgitation directed   centrally.  Laboratory Data:  Chemistry Recent Labs  Lab 04/05/18 2036  NA 140  K 4.0  CL 107  CO2 25  GLUCOSE 119*  BUN 19  CREATININE 0.99  CALCIUM 9.2  GFRNONAA 58*  GFRAA >60  ANIONGAP 8    Recent Labs  Lab 04/05/18 2036  PROT 7.2  ALBUMIN 4.1  AST 26  ALT 20  ALKPHOS 71  BILITOT 0.6   Hematology Recent Labs  Lab 04/05/18 2036  WBC 10.6*  RBC 4.50  HGB 13.3  HCT 40.5  MCV 90.0  MCH 29.6  MCHC 32.8  RDW 14.6  PLT 281   Cardiac Enzymes Recent Labs  Lab 04/05/18 2036 04/05/18 2308 04/06/18 0149  TROPONINI 0.11* 0.13* 0.10*   No results for input(s): TROPIPOC in the last 168 hours.  BNPNo results for input(s):  BNP, PROBNP in the last 168 hours.  DDimer No results for input(s): DDIMER in the last 168 hours.  Radiology/Studies:  Dg Chest 2 View  Result Date: 04/05/2018 CLINICAL DATA:  Mental status changes with confusion. Personal history of RIGHT mastectomy for breast cancer. EXAM: CHEST - 2 VIEW COMPARISON:  08/04/2017, 11/28/2016 and earlier. FINDINGS: Cardiac silhouette mildly to moderately enlarged, unchanged. Thoracic aorta mildly atherosclerotic, unchanged. Hilar and mediastinal contours otherwise unremarkable. Scarring involving the lingula and LEFT LOWER LOBE, unchanged. Lungs otherwise clear. No localized airspace consolidation. No pleural effusions. No pneumothorax. Normal pulmonary vascularity. Visualized bony thorax intact. IMPRESSION: 1. No acute cardiopulmonary disease. Stable scarring involving the lingula and LEFT LOWER LOBE. 2. Stable mild-to-moderate cardiomegaly without pulmonary edema. Electronically Signed   By: Evangeline Dakin M.D.   On: 04/05/2018 21:07   Ct Head Wo Contrast  Result Date: 04/05/2018 CLINICAL DATA:  Acute confusion. EXAM: CT HEAD WITHOUT CONTRAST TECHNIQUE: Contiguous axial images were obtained from the base of the skull through the vertex without intravenous contrast. COMPARISON:  CT head 08/04/2017, 05/10/2014 and earlier. MRI brain 04/24/2012. FINDINGS: Brain: Ventricular system normal in size and appearance for age. No mass lesion. No midline shift. No acute hemorrhage or hematoma. No extra-axial fluid collections. No evidence of acute infarction. No focal brain parenchymal abnormalities. Vascular: Mild BILATERAL carotid siphon and RIGHT vertebral artery atherosclerosis. No hyperdense vessel. Skull: No skull fracture or other focal osseous abnormality involving the skull. Sinuses/Orbits: Prior BILATERAL ethmoidectomies. Visualized paranasal sinuses, bilateral mastoid air cells and bilateral middle ear cavities well-aerated. Visualized orbits and globes normal in  appearance. Other: None. IMPRESSION: No acute intracranial abnormality. Electronically Signed   By: Evangeline Dakin  M.D.   On: 04/05/2018 21:10    Assessment and Plan:   1. Worsening confusion/combativeness - this is the primary issue that needs to be addressed, and would not be affected by any cardiac intervention.  2. Elevated troponin - not clear why level was checked. No recent anginal symptoms, therefore of unclear clinical significance at this time - may be driven by uncontrolled HTN. She is refusing IV and ASA. Given recently worsening confusion and absence of angina, I do not suspect any further workup is indicated at present time. Can revisit as OP with primary cardiologist.  3. Chronic combined CHF/NICM - appears euvolemic. She is only on Toprol and Lasix. Not clear if compliant at home given recent avoidance of medical appointments. Follow BP here and consider re-trial of ACEI/ARB. Apparently she did not previously tolerate Coreg or Benicar due to numerous side effects. She has extensive list of medication intolerances so would not make more than one med change at a time in the future.  4. Moderate MR by echo 2016 - can f/u as OP for this.  5. LBBB - chronic.  For questions or updates, please contact [place] Please consult [email or web address] for contact info under Cardiology/STEMI.    Signed, Charlie Pitter, PA-C  04/06/2018 8:28 AM  The patient was seen and examined, and I agree with the history, physical exam, assessment and plan as documented above, with modifications as noted below. I have also personally reviewed all relevant documentation, old records, labs, and both radiographic and cardiovascular studies. I have also independently interpreted old and new ECG's.  Briefly, this is a 67 year old woman with a past medical history significant for chronic combined systolic and diastolic heart failure and left bundle branch block.  She presumably has a nonischemic  cardiomyopathy.  She had a normal nuclear stress test in September 2016.  Echocardiogram in 2016 showed mild to moderate left ventricular dysfunction, LVEF 40 to 55%, grade 2 diastolic dysfunction, and moderate mitral regurgitation.  She was brought to the hospital due to combativeness and refusing to go to doctor's appointments.  Her husband reported that the patient was talking to her self.  Troponins were checked for unclear reasons and were found to be nonspecifically elevated.  Chest x-ray showed no active disease with stable mild to moderate cardiomegaly.  Head CT did not demonstrate any acute intracranial abnormalities.  ECG which I personally reviewed demonstrates left bundle branch block which is chronic.  The patient denies any symptoms of chest pain, palpitations, shortness of breath, lightheadedness, dizziness, leg swelling, orthopnea, PND, and syncope.  It appears her primary issues are psychiatric in nature.  She does not require an ischemic work-up.  She is euvolemic on Toprol XL and Lasix.  She is hypertensive and either an ACE inhibitor or angiotensin receptor blocker could be initiated.  She has had side effects from carvedilol and Benicar in the past.  She has multiple medication intolerances.  No further cardiac work-up is planned.  [facility] will sign off.   Medication Recommendations:  See above Other recommendations (labs, testing, etc):  None Follow up as an outpatient:  With Dr. Kennon Holter, MD, [facility]  04/06/2018 10:35 AM

## 2018-04-06 NOTE — Progress Notes (Signed)
  Echocardiogram 2D Echocardiogram has been performed.  Kylie Tucker 04/06/2018, 12:43 PM

## 2018-04-06 NOTE — Discharge Summary (Signed)
Physician Discharge Summary  Kylie Tucker CXK:[id number] DOB: 05/03/1951 DOA: 04/05/2018  PCP: Redmond School, MD  Admit date: 04/05/2018  Discharge date: 04/06/2018  Admitted From: Home  Disposition: Home  Recommendations for Outpatient Follow-up:  1. Follow up with PCP in 1-2 weeks   Home Health: None  Equipment/Devices: None  Discharge Condition: Stable  CODE STATUS: Full  Diet recommendation: Heart Healthy  Brief/Interim Summary:  This is a 67 year old Caucasian female who was brought to the ED by her husband after she was supposedly noted to be confused and hallucinating.  After evaluation by TTS it appears that the patient and her husband were having fights at home and the husband managed to file some paperwork on her for involuntary commitment.  Patient denied any suicidal or homicidal ideation, psychosis, or paranoia has been cleared by tele-psychiatry for discharge to resume her usual home medications to include Xanax as needed for anxiety.  During her stay, she was also seen by cardiology as her troponin was noted to be elevated, however the patient never had any symptoms of chest pain, shortness of breath or anything suggesting a cardiac event.  Her troponins have remained flat and her 2D echocardiogram demonstrates LVEF of 40 to 45% with diffuse hypokinesis.  This is similar to her prior echocardiogram in 2016 which demonstrated the same.  Her IVC will be discontinued along with a sitter at bedside as she has no other medical issues at this time and cardiology has signed off recommending her to resume her usual home medications and follow-up with her cardiologist Dr. Mertie Moores as scheduled.  Discharge Diagnoses:  Principal Problem:   Elevated troponin Active Problems:   LBBB (left bundle branch block)   Anxiety disorder   Hallucinations   Agitation   HTN (hypertension)    Discharge Instructions  Discharge Instructions    Diet - low sodium heart healthy    Complete by:  As directed    Increase activity slowly   Complete by:  As directed      Allergies as of 04/06/2018      Reactions   Acetaminophen Nausea Only   Other reaction(s): Other (See Comments)   Amoxicillin-pot Clavulanate Nausea Only   flushing   Aspirin    Gastrointestinal upset   Azithromycin    Heart palpitations   Cefuroxime Axetil Nausea Only   Cephalexin Nausea Only   Clonazepam    Heart had a "weird feeling" and difficulty breathing   Hydrocodone-acetaminophen Hives   Hydrocodone-acetaminophen    Other reaction(s): GI Upset (intolerance)   Levofloxacin Nausea Only   Milk-related Compounds Nausea And Vomiting   Morphine And Related Other (See Comments)   Hallucinations   Ondansetron Nausea And Vomiting   Propoxyphene N-acetaminophen Nausea And Vomiting   Rosuvastatin    REACTION: myalgia   Sulfonamide Derivatives Itching   Tramadol Nausea And Vomiting   Trazodone And Nefazodone Nausea Only   Felt like "I was burning all over" -patient   Vioxx [rofecoxib] Nausea And Vomiting   Zofran [ondansetron Hcl] Nausea And Vomiting   Clarithromycin Nausea Only, Anxiety   Codeine Itching, Rash   Seroquel [quetiapine Fumarate] Palpitations   Heart racing   Sulfamethoxazole Rash      Medication List    TAKE these medications   ALPRAZOLAM XR 3 MG 24 hr tablet Generic drug:  ALPRAZolam Take 3 mg by mouth at bedtime.   BIOTIN PO Take 1 capsule by mouth daily.   furosemide 40 MG tablet Commonly known as:  LASIX Take 40 mg by mouth daily.   GARLIQUE PO Take 1 capsule by mouth daily.   hydrocortisone 2.5 % cream Apply 1 application topically daily as needed (for skin irritation).   metoprolol succinate 50 MG 24 hr tablet Commonly known as:  TOPROL-XL Take 1 tablet by mouth every evening.   multivitamin with minerals Tabs tablet Take 1 tablet by mouth daily.   omeprazole 20 MG capsule Commonly known as:  PRILOSEC Take 1 capsule (20 mg total) by mouth  daily. Take one tablet daily   PROBIOTIC FORMULA PO Take 1 capsule by mouth daily.   sucralfate 1 g tablet Commonly known as:  CARAFATE Take 1 tablet (1 g total) by mouth 4 (four) times daily -  with meals and at bedtime.      Follow-up Information    Redmond School, MD Follow up in 1 week(s).   Specialty:  Internal Medicine Contact information: [address] [place] [address] [phone]        Adrian Prows, MD .   Specialty:  Cardiology Contact information: [address] [place] [address] [phone]          Allergies  Allergen Reactions  . Acetaminophen Nausea Only    Other reaction(s): Other (See Comments)  . Amoxicillin-Pot Clavulanate Nausea Only    flushing  . Aspirin     Gastrointestinal upset  . Azithromycin     Heart palpitations   . Cefuroxime Axetil Nausea Only  . Cephalexin Nausea Only  . Clonazepam     Heart had a "weird feeling" and difficulty breathing  . Hydrocodone-Acetaminophen Hives  . Hydrocodone-Acetaminophen     Other reaction(s): GI Upset (intolerance)  . Levofloxacin Nausea Only  . Milk-Related Compounds Nausea And Vomiting  . Morphine And Related Other (See Comments)    Hallucinations  . Ondansetron Nausea And Vomiting  . Propoxyphene N-Acetaminophen Nausea And Vomiting  . Rosuvastatin     REACTION: myalgia  . Sulfonamide Derivatives Itching  . Tramadol Nausea And Vomiting  . Trazodone And Nefazodone Nausea Only    Felt like "I was burning all over" -patient  . Vioxx [Rofecoxib] Nausea And Vomiting  . Zofran [Ondansetron Hcl] Nausea And Vomiting  . Clarithromycin Nausea Only and Anxiety  . Codeine Itching and Rash  . Seroquel [Quetiapine Fumarate] Palpitations    Heart racing   . Sulfamethoxazole Rash    Consultations:  Cardiology  TTS   Procedures/Studies: Dg Chest 2 View  Result Date: 04/05/2018 CLINICAL DATA:  Mental status changes with confusion. Personal history of RIGHT mastectomy for  breast cancer. EXAM: CHEST - 2 VIEW COMPARISON:  08/04/2017, 11/28/2016 and earlier. FINDINGS: Cardiac silhouette mildly to moderately enlarged, unchanged. Thoracic aorta mildly atherosclerotic, unchanged. Hilar and mediastinal contours otherwise unremarkable. Scarring involving the lingula and LEFT LOWER LOBE, unchanged. Lungs otherwise clear. No localized airspace consolidation. No pleural effusions. No pneumothorax. Normal pulmonary vascularity. Visualized bony thorax intact. IMPRESSION: 1. No acute cardiopulmonary disease. Stable scarring involving the lingula and LEFT LOWER LOBE. 2. Stable mild-to-moderate cardiomegaly without pulmonary edema. Electronically Signed   By: Evangeline Dakin M.D.   On: 04/05/2018 21:07   Ct Head Wo Contrast  Result Date: 04/05/2018 CLINICAL DATA:  Acute confusion. EXAM: CT HEAD WITHOUT CONTRAST TECHNIQUE: Contiguous axial images were obtained from the base of the skull through the vertex without intravenous contrast. COMPARISON:  CT head 08/04/2017, 05/10/2014 and earlier. MRI brain 04/24/2012. FINDINGS: Brain: Ventricular system normal in size and appearance for age. No mass  lesion. No midline shift. No acute hemorrhage or hematoma. No extra-axial fluid collections. No evidence of acute infarction. No focal brain parenchymal abnormalities. Vascular: Mild BILATERAL carotid siphon and RIGHT vertebral artery atherosclerosis. No hyperdense vessel. Skull: No skull fracture or other focal osseous abnormality involving the skull. Sinuses/Orbits: Prior BILATERAL ethmoidectomies. Visualized paranasal sinuses, bilateral mastoid air cells and bilateral middle ear cavities well-aerated. Visualized orbits and globes normal in appearance. Other: None. IMPRESSION: No acute intracranial abnormality. Electronically Signed   By: Evangeline Dakin M.D.   On: 04/05/2018 21:10     Discharge Exam: Vitals:   04/06/18 0144 04/06/18 1619  BP: (!) 188/88 (!) 171/76  Pulse: 90 81  Resp: 16 16   Temp: 98.9 F (37.2 C) 98.5 F (36.9 C)  SpO2: 96% 95%   Vitals:   04/06/18 0000 04/06/18 0030 04/06/18 0144 04/06/18 1619  BP: (!) 177/78 (!) 165/73 (!) 188/88 (!) 171/76  Pulse: 87  90 81  Resp: (!) 26 (!) 24 16 16   Temp:   98.9 F (37.2 C) 98.5 F (36.9 C)  TempSrc:   Oral Oral  SpO2: 98%  96% 95%  Weight:      Height:        General: Pt is alert, awake, not in acute distress Cardiovascular: RRR, S1/S2 +, no rubs, no gallops Respiratory: CTA bilaterally, no wheezing, no rhonchi Abdominal: Soft, NT, ND, bowel sounds + Extremities: no edema, no cyanosis    The results of significant diagnostics from this hospitalization (including imaging, microbiology, ancillary and laboratory) are listed below for reference.     Microbiology: No results found for this or any previous visit (from the past 240 hour(s)).   Labs: BNP (last 3 results) No results for input(s): BNP in the last 8760 hours. Basic Metabolic Panel: Recent Labs  Lab 04/05/18 2036  NA 140  K 4.0  CL 107  CO2 25  GLUCOSE 119*  BUN 19  CREATININE 0.99  CALCIUM 9.2   Liver Function Tests: Recent Labs  Lab 04/05/18 2036  AST 26  ALT 20  ALKPHOS 71  BILITOT 0.6  PROT 7.2  ALBUMIN 4.1   No results for input(s): LIPASE, AMYLASE in the last 168 hours. Recent Labs  Lab 04/05/18 2036  AMMONIA 22   CBC: Recent Labs  Lab 04/05/18 2036  WBC 10.6*  NEUTROABS 8.2*  HGB 13.3  HCT 40.5  MCV 90.0  PLT 281   Cardiac Enzymes: Recent Labs  Lab 04/05/18 2036 04/05/18 2308 04/06/18 0149  TROPONINI 0.11* 0.13* 0.10*   BNP: Invalid input(s): POCBNP CBG: No results for input(s): GLUCAP in the last 168 hours. D-Dimer No results for input(s): DDIMER in the last 72 hours. Hgb A1c No results for input(s): HGBA1C in the last 72 hours. Lipid Profile No results for input(s): CHOL, HDL, LDLCALC, TRIG, CHOLHDL, LDLDIRECT in the last 72 hours. Thyroid function studies Recent Labs    04/06/18 0149   TSH 4.312   Anemia work up No results for input(s): VITAMINB12, FOLATE, FERRITIN, TIBC, IRON, RETICCTPCT in the last 72 hours. Urinalysis    Component Value Date/Time   COLORURINE YELLOW 04/05/2018 1925   APPEARANCEUR HAZY (A) 04/05/2018 1925   LABSPEC 1.016 04/05/2018 1925   PHURINE 9.0 (H) 04/05/2018 1925   GLUCOSEU NEGATIVE 04/05/2018 1925   HGBUR NEGATIVE 04/05/2018 1925   BILIRUBINUR NEGATIVE 04/05/2018 1925   BILIRUBINUR n 05/19/2017 1050   KETONESUR NEGATIVE 04/05/2018 1925   PROTEINUR NEGATIVE 04/05/2018 1925   UROBILINOGEN 0.2 05/19/2017  1050   UROBILINOGEN 0.2 06/29/2010 1418   NITRITE NEGATIVE 04/05/2018 1925   LEUKOCYTESUR NEGATIVE 04/05/2018 1925   Sepsis Labs Invalid input(s): PROCALCITONIN,  WBC,  LACTICIDVEN Microbiology No results found for this or any previous visit (from the past 240 hour(s)).   Time coordinating discharge: 35 minutes  SIGNED:   Rodena Goldmann, DO [facility] 04/06/2018, 6:26 PM Pager [phone]  If 7PM-7AM, please contact night-coverage [email or web address] Password [email or web address]

## 2018-04-06 NOTE — Care Management Obs Status (Signed)
[facility] NOTIFICATION   Patient Details  Name: KAAREN NASS MRN: [id number] Date of Birth: 11/29/1950   Medicare Observation Status Notification Given:  Yes    Sherald Barge, RN 04/06/2018, 2:30 PM

## 2018-04-06 NOTE — BH Assessment (Addendum)
Tele Assessment Note   Patient Name: Kylie Tucker MRN: [id number] Referring Physician: Francine Graven, DO Location of Patient: [facility] Location of Provider: [place] is a married 67 y.o. female who presents involuntarily to [facility]. She was petitioned by her spouse of 14 years. Pt states this is her 3 marriage. Pt reports some symptoms of depression- tearfulness & sadness. She denies current & past suicidal ideation. Pt denies ever having made a suicidal plan or gesture. Pt denies homicidal ideation & history of violence. She denies auditory or visual hallucinations & other psychotic symptoms. Pt states current stressors include an increase in not getting along with her spouse over the past 6 months.  Pt lives with her husband, and supports include her husband, son and friends. Pt denies there is a family history of suicide or mental illness. Pt reports she is retired from work. Pt has fair insight and judgment. Pt's memory appears intact. Legal history includes no charges. ? Pt denies hx or current mental health treatment- inpatient or outpatient.  Pt denies alcohol/ substance abuse. ? MSE: Pt is dressed in scrubs, alert, oriented x4 with terse speech and normal motor behavior. Eye contact is good. Pt's mood is apprehensive and pleasant; affect is constricted. Affect is congruent with mood. Thought process is coherent and relevant. There is no indication pt is currently responding to internal stimuli or experiencing delusional thought content. Pt was cooperative throughout assessment.   Disposition: Earleen Newport, NP recommends psychiatric clearance. Pt to be discharged from hospital with follow up from primary physician & outpt psychiatric resources.    Diagnosis: F33.1 Major Depressive Disorder, recurrent, moderate  Past Medical History:  Past Medical History:  Diagnosis Date  . Allergy   . Anxiety   . Bilateral ovarian cysts    Do yearly ultrasound.   . Cancer ([place]) rt breast   2004/ chemo/ radiation  . Cellulitis and abscess of upper arm and forearm   . Chronic combined systolic and diastolic CHF (congestive heart failure) ([place])   . Family history of breast cancer   . Family history of rectal cancer   . GERD (gastroesophageal reflux disease)   . H/O Helicobacter infection 2017  . History of right mastectomy 2004   chemo/ radiation  . Hyperlipidemia   . Hypertension   . Insomnia   . Left bundle branch block 2016  . Migraines   . Moderate mitral regurgitation   . NICM (nonischemic cardiomyopathy) ([facility])   . PONV (postoperative nausea and vomiting)    cannot take zofran  . Postmastectomy lymphedema rt side  . Sentinel node     Past Surgical History:  Procedure Laterality Date  . BREAST EXCISIONAL BIOPSY Left    benign  . CARPAL TUNNEL RELEASE    . CATARACT EXTRACTION Left 07/12/2013  . MANDIBLE SURGERY  1991  . MASTECTOMY Right    malignant  . NASAL SINUS SURGERY  1990/2001  . ORIF FINGER FRACTURE  02/14/2012   Procedure: OPEN REDUCTION INTERNAL FIXATION (ORIF) METACARPAL (FINGER) FRACTURE;  Surgeon: Wynonia Sours, MD;  Location: [facility];  Service: Orthopedics;  Laterality: Right;  RIGHT FIFTH   . ORIF METACARPAL FRACTURE  8/13   rt   . PORT-A-CATH REMOVAL     in and now out  . REPAIR EXTENSOR TENDON Left 05/30/2013   Procedure: RELEASE TRANSPOSITION EXTENSOR POLLICUS LONGUS LEFT WRIST;  Surgeon: Wynonia Sours, MD;  Location: [facility];  Service: Orthopedics;  Laterality: Left;  . rt mastectomy  2004   lymph nodes-7-axillary node dissection  . SHOULDER ARTHROSCOPY WITH ROTATOR CUFF REPAIR AND SUBACROMIAL DECOMPRESSION Left 11/22/2013   Procedure: LEFT SHOULDER ARTHROSCOPY WITH SUBACROMIAL DECOMPRESSION DISTAL CLAVICLE RESECTION REPAIR  ROTATOR CUFF AS NEEDED ;  Surgeon: Cammie Sickle., MD;  Location: [facility];  Service: Orthopedics;  Laterality: Left;    Family History:   Family History  Problem Relation Age of Onset  . Heart disease Father   . Heart disease Mother   . Hypertension Mother   . Rectal cancer Mother        dx in her early 73s  . Colon polyps Mother   . Breast cancer Maternal Aunt        dx in her early 50s  . Heart disease Paternal Uncle   . Heart disease Paternal Grandmother   . Heart disease Paternal Grandfather   . Diabetes Brother   . Breast cancer Maternal Grandmother        dx in her 33s  . Breast cancer Cousin        dx in early 21s  . Breast cancer Cousin 30  . Hepatitis C Son   . Hypertension Son   . Post-traumatic stress disorder Son     Social History:  reports that she has never smoked. She has never used smokeless tobacco. She reports that she does not drink alcohol or use drugs.  Additional Social History:  Alcohol / Drug Use Pain Medications: See MAR Prescriptions: See MAR Over the Counter: See MAR  CIWA: CIWA-Ar BP: (!) 188/88 Pulse Rate: 90 COWS:    Allergies:  Allergies  Allergen Reactions  . Acetaminophen Nausea Only    Other reaction(s): Other (See Comments)  . Amoxicillin-Pot Clavulanate Nausea Only    flushing  . Aspirin     Gastrointestinal upset  . Azithromycin     Heart palpitations   . Cefuroxime Axetil Nausea Only  . Cephalexin Nausea Only  . Clonazepam     Heart had a "weird feeling" and difficulty breathing  . Hydrocodone-Acetaminophen Hives  . Hydrocodone-Acetaminophen     Other reaction(s): GI Upset (intolerance)  . Levofloxacin Nausea Only  . Milk-Related Compounds Nausea And Vomiting  . Morphine And Related Other (See Comments)    Hallucinations  . Ondansetron Nausea And Vomiting  . Propoxyphene N-Acetaminophen Nausea And Vomiting  . Rosuvastatin     REACTION: myalgia  . Sulfonamide Derivatives Itching  . Tramadol Nausea And Vomiting  . Trazodone And Nefazodone Nausea Only    Felt like "I was burning all over" -patient  . Vioxx [Rofecoxib] Nausea And Vomiting  . Zofran  [Ondansetron Hcl] Nausea And Vomiting  . Clarithromycin Nausea Only and Anxiety  . Codeine Itching and Rash  . Seroquel [Quetiapine Fumarate] Palpitations    Heart racing   . Sulfamethoxazole Rash    Home Medications:  Medications Prior to Admission  Medication Sig Dispense Refill  . ALPRAZOLAM XR 3 MG 24 hr tablet Take 3 mg by mouth at bedtime.    . Bacillus Coagulans-Inulin (PROBIOTIC FORMULA PO) Take 1 capsule by mouth daily.    Marland Kitchen BIOTIN PO Take 1 capsule by mouth daily.    . furosemide (LASIX) 40 MG tablet Take 40 mg by mouth daily.     . Garlic (GARLIQUE PO) Take 1 capsule by mouth daily.    . hydrocortisone 2.5 % cream Apply 1 application topically daily as needed (for skin irritation).     Marland Kitchen  metoprolol succinate (TOPROL-XL) 50 MG 24 hr tablet Take 1 tablet by mouth every evening.     . Multiple Vitamin (MULTIVITAMIN WITH MINERALS) TABS tablet Take 1 tablet by mouth daily.    Marland Kitchen omeprazole (PRILOSEC) 20 MG capsule Take 1 capsule (20 mg total) by mouth daily. Take one tablet daily 21 capsule 0  . sucralfate (CARAFATE) 1 g tablet Take 1 tablet (1 g total) by mouth 4 (four) times daily -  with meals and at bedtime. 21 tablet 0    OB/GYN Status:  Patient's last menstrual period was 01/10/2003.  General Assessment Data Location of Assessment: [facility] TTS Assessment: In system Is this a Tele or Face-to-Face Assessment?: Tele Assessment Is this an Initial Assessment or a Re-assessment for this encounter?: Initial Assessment Patient Accompanied by:: Other(sitter) Language Other than English: No Living Arrangements: Other (Comment)(husband) What gender do you identify as?: Female Marital status: Married Pharmacist, community name: [place] Pregnancy Status: No Living Arrangements: Spouse/significant other Can pt return to current living arrangement?: Yes Admission Status: Involuntary Petitioner: Family member(husband) Is patient capable of signing voluntary admission?: Yes Referral Source:  Self/Family/Friend Insurance type: healthteam advantage     Crisis Care Plan Living Arrangements: Spouse/significant other Name of Psychiatrist: None Name of Therapist: None  Education Status Is patient currently in school?: No Is the patient employed, unemployed or receiving disability?: (Retired)  Risk to self with the past 6 months Suicidal Ideation: No Has patient been a risk to self within the past 6 months prior to admission? : No Suicidal Intent: No Has patient had any suicidal intent within the past 6 months prior to admission? : No Is patient at risk for suicide?: No Suicidal Plan?: No Has patient had any suicidal plan within the past 6 months prior to admission? : No Access to Means: No What has been your use of drugs/alcohol within the last 12 months?: none Previous Attempts/Gestures: No How many times?: 0 Other Self Harm Risks: denies Triggers for Past Attempts: (NA) Intentional Self Injurious Behavior: None Family Suicide History: No Recent stressful life event(s): Conflict (Comment)(not been getting along with spouse x 6 months) Persecutory voices/beliefs?: No Depression: No Depression Symptoms: Tearfulness, Despondent Substance abuse history and/or treatment for substance abuse?: No Suicide prevention information given to non-admitted patients: Not applicable  Risk to Others within the past 6 months Homicidal Ideation: No Does patient have any lifetime risk of violence toward others beyond the six months prior to admission? : No Thoughts of Harm to Others: No Current Homicidal Intent: No Current Homicidal Plan: No Access to Homicidal Means: No History of harm to others?: No Assessment of Violence: None Noted Violent Behavior Description: denies Does patient have access to weapons?: No Criminal Charges Pending?: No Does patient have a court date: No Is patient on probation?: No  Psychosis Hallucinations: None noted Delusions: None noted  Mental  Status Report Appearance/Hygiene: Unremarkable Eye Contact: Good Motor Activity: Freedom of movement, Unremarkable Speech: Logical/coherent, Rapid Level of Consciousness: Alert Mood: Apprehensive, Pleasant Affect: Constricted, Apprehensive Anxiety Level: Minimal Thought Processes: Coherent, Relevant Judgement: Unimpaired Orientation: Person, Place, Time, Situation, Appropriate for developmental age Obsessive Compulsive Thoughts/Behaviors: None  Cognitive Functioning Concentration: Good Memory: Recent Intact, Remote Intact Is patient IDD: No Insight: Fair Impulse Control: Fair Appetite: Fair Sleep: No Change Total Hours of Sleep: 7 Vegetative Symptoms: None  ADLScreening [facility] Assessment Services) Patient's cognitive ability adequate to safely complete daily activities?: Yes Patient able to express need for assistance with ADLs?: Yes Independently performs ADLs?: Yes (  appropriate for developmental age)  Prior Inpatient Therapy Prior Inpatient Therapy: No  Prior Outpatient Therapy Prior Outpatient Therapy: No Does patient have an ACCT team?: No Does patient have Intensive In-House Services?  : No Does patient have [facility] services? : No Does patient have P4CC services?: No  ADL Screening (condition at time of admission) Patient's cognitive ability adequate to safely complete daily activities?: Yes Is the patient deaf or have difficulty hearing?: No Does the patient have difficulty seeing, even when wearing glasses/contacts?: No Does the patient have difficulty concentrating, remembering, or making decisions?: No Patient able to express need for assistance with ADLs?: Yes Does the patient have difficulty dressing or bathing?: No Independently performs ADLs?: Yes (appropriate for developmental age) Communication: Independent Dressing (OT): Independent Grooming: Independent Feeding: Independent Bathing: Independent Toileting: Independent In/Out Bed: Independent Walks  in Home: Independent Does the patient have difficulty walking or climbing stairs?: No Weakness of Legs: None Weakness of Arms/Hands: None  Home Assistive Devices/Equipment Home Assistive Devices/Equipment: None  Therapy Consults (therapy consults require a physician order) PT Evaluation Needed: No OT Evalulation Needed: No SLP Evaluation Needed: No Abuse/Neglect Assessment (Assessment to be complete while patient is alone) Abuse/Neglect Assessment Can Be Completed: Yes Physical Abuse: Denies Verbal Abuse: Denies Sexual Abuse: Denies Exploitation of patient/patient's resources: Denies Self-Neglect: Denies Values / Beliefs Cultural Requests During Hospitalization: None Spiritual Requests During Hospitalization: None Consults Spiritual Care Consult Needed: No Social Work Consult Needed: No Regulatory affairs officer (For Healthcare) Does Patient Have a Medical Advance Directive?: No Would patient like information on creating a medical advance directive?: No - Patient declined Nutrition Screen- [facility] Adult/[facility] Patient's home diet: Regular Has the patient recently lost weight without trying?: No Has the patient been eating poorly because of a decreased appetite?: No Malnutrition Screening Tool Score: 0        Disposition: Shuvon Rankin, NP recommends psychiatric clearance. Pt to be discharged from hospital with follow up from primary physician & outpt psychiatric resources Disposition Initial Assessment Completed for this Encounter: Yes Disposition of Patient: (pending NP consult)  This service was provided via telemedicine using a 2-way, interactive audio and video technology.  Names of all persons participating in this telemedicine service and their role in this encounter. Name:  Role:              Richardean Chimera 04/06/2018 11:27 AM

## 2018-04-06 NOTE — Progress Notes (Signed)
Pts husband in room at this time. Pt being very short and demanding towards husband.

## 2018-04-06 NOTE — H&P (Signed)
History and Physical    [name]:[id number] DOB: [birth year] DOA: 04/05/2018  PCP: Redmond School, MD  Patient coming from: home  I have personally briefly reviewed patient's old medical records in [place]  Chief Complaint: agitaion  HPI: Kylie Tucker is a 67 y.o. female with medical history significant of hypertension, anxiety presents to the ER under IVC papers with husband and police.  Patient has been aggressive with husband.  Husband reported to ED doctor that  she is also been talking to herself and not going to her medical appointments.  Work-up in the ED revealed a minimally elevated troponin.  EKG showed chronic left bundle branch block.  She denies any chest pain or shortness of breath.  She states she is here because she is been fighting with her husband.  She does not offer any further history   ED Course: ED physician Dr. Thurnell Garbe consulted cardiology who felt it was okay for patient to stay here for repeat cardiac enzymes and cardiology consultation in the morning.  Patient  refused aspirin and IV placement in the ED  Review of Systems: No chest pain, shortness of breath ,lower extremity edema All others reviewed with patient  and are  negative unless otherwise stated .    Past Medical History:  Diagnosis Date  . Allergy   . Anxiety   . Bilateral ovarian cysts    Do yearly ultrasound.  . Cancer ([place]) rt breast   2004/ chemo/ radiation  . Cellulitis and abscess of upper arm and forearm   . Family history of breast cancer   . Family history of rectal cancer   . GERD (gastroesophageal reflux disease)   . H/O Helicobacter infection 2017  . History of right mastectomy 2004   chemo/ radiation  . Hyperlipidemia   . Hypertension   . Insomnia   . Left bundle branch block 2016  . Migraines   . PONV (postoperative nausea and vomiting)    cannot take zofran  . Postmastectomy lymphedema rt side  . Sentinel node     Past Surgical History:    Procedure Laterality Date  . BREAST EXCISIONAL BIOPSY Left    benign  . CARPAL TUNNEL RELEASE    . CATARACT EXTRACTION Left 07/12/2013  . MANDIBLE SURGERY  1991  . MASTECTOMY Right    malignant  . NASAL SINUS SURGERY  1990/2001  . ORIF FINGER FRACTURE  02/14/2012   Procedure: OPEN REDUCTION INTERNAL FIXATION (ORIF) METACARPAL (FINGER) FRACTURE;  Surgeon: Wynonia Sours, MD;  Location: [facility];  Service: Orthopedics;  Laterality: Right;  RIGHT FIFTH   . ORIF METACARPAL FRACTURE  8/13   rt   . PORT-A-CATH REMOVAL     in and now out  . REPAIR EXTENSOR TENDON Left 05/30/2013   Procedure: RELEASE TRANSPOSITION EXTENSOR POLLICUS LONGUS LEFT WRIST;  Surgeon: Wynonia Sours, MD;  Location: [facility];  Service: Orthopedics;  Laterality: Left;  . rt mastectomy  2004   lymph nodes-7-axillary node dissection  . SHOULDER ARTHROSCOPY WITH ROTATOR CUFF REPAIR AND SUBACROMIAL DECOMPRESSION Left 11/22/2013   Procedure: LEFT SHOULDER ARTHROSCOPY WITH SUBACROMIAL DECOMPRESSION DISTAL CLAVICLE RESECTION REPAIR  ROTATOR CUFF AS NEEDED ;  Surgeon: Cammie Sickle., MD;  Location: [facility];  Service: Orthopedics;  Laterality: Left;     reports that she has never smoked. She has never used smokeless tobacco. She reports that she does not drink alcohol or use drugs.  Allergies  Allergen Reactions  . Acetaminophen Nausea Only    Other reaction(s): Other (See Comments)  . Amoxicillin-Pot Clavulanate Nausea Only    flushing  . Aspirin     Gastrointestinal upset  . Azithromycin     Heart palpitations   . Cefuroxime Axetil Nausea Only  . Cephalexin Nausea Only  . Clonazepam     Heart had a "weird feeling" and difficulty breathing  . Hydrocodone-Acetaminophen Hives  . Hydrocodone-Acetaminophen     Other reaction(s): GI Upset (intolerance)  . Levofloxacin Nausea Only  . Milk-Related Compounds Nausea And Vomiting  . Morphine And Related Other (See Comments)     Hallucinations  . Ondansetron Nausea And Vomiting  . Propoxyphene N-Acetaminophen Nausea And Vomiting  . Rosuvastatin     REACTION: myalgia  . Sulfonamide Derivatives Itching  . Tramadol Nausea And Vomiting  . Trazodone And Nefazodone Nausea Only    Felt like "I was burning all over" -patient  . Vioxx [Rofecoxib] Nausea And Vomiting  . Zofran [Ondansetron Hcl] Nausea And Vomiting  . Clarithromycin Nausea Only and Anxiety  . Codeine Itching and Rash  . Seroquel [Quetiapine Fumarate] Palpitations    Heart racing   . Sulfamethoxazole Rash    Family History  Problem Relation Age of Onset  . Heart disease Father   . Heart disease Mother   . Hypertension Mother   . Rectal cancer Mother        dx in her early 54s  . Colon polyps Mother   . Breast cancer Maternal Aunt        dx in her early 80s  . Heart disease Paternal Uncle   . Heart disease Paternal Grandmother   . Heart disease Paternal Grandfather   . Diabetes Brother   . Breast cancer Maternal Grandmother        dx in her 69s  . Breast cancer Cousin        dx in early 29s  . Breast cancer Cousin 43  . Hepatitis C Son   . Hypertension Son   . Post-traumatic stress disorder Son      Prior to Admission medications   Medication Sig Start Date End Date Taking? Authorizing Provider  ALPRAZOLAM XR 3 MG 24 hr tablet Take 3 mg by mouth at bedtime. 06/30/16  Yes [provider]  Bacillus Coagulans-Inulin (PROBIOTIC FORMULA PO) Take 1 capsule by mouth daily.   Yes [provider]  BIOTIN PO Take 1 capsule by mouth daily.   Yes [provider]  furosemide (LASIX) 40 MG tablet Take 40 mg by mouth daily.  11/24/16  Yes [provider]  Garlic (GARLIQUE PO) Take 1 capsule by mouth daily.   Yes [provider]  hydrocortisone 2.5 % cream Apply 1 application topically daily as needed (for skin irritation).  10/19/17  Yes [provider]  metoprolol succinate (TOPROL-XL) 50 MG 24  hr tablet Take 1 tablet by mouth every evening.  08/23/16  Yes [provider]  Multiple Vitamin (MULTIVITAMIN WITH MINERALS) TABS tablet Take 1 tablet by mouth daily.   Yes [provider]  omeprazole (PRILOSEC) 20 MG capsule Take 1 capsule (20 mg total) by mouth daily. Take one tablet daily 12/06/17  Yes Carmin Muskrat, MD  sucralfate (CARAFATE) 1 g tablet Take 1 tablet (1 g total) by mouth 4 (four) times daily -  with meals and at bedtime. 12/06/17  Yes Carmin Muskrat, MD    Physical Exam: Vitals:   04/05/18 2335 04/06/18 0000  04/06/18 0030 04/06/18 0144  BP: (!) 182/84 (!) 177/78 (!) 165/73 (!) 188/88  Pulse: 80 87  90  Resp: 18 (!) 26 (!) 24 16  Temp:    98.9 F (37.2 C)  TempSrc:    Oral  SpO2: 95% 98%  96%  Weight:      Height:        Constitutional: NAD, calm, comfortable Vitals:   04/05/18 2335 04/06/18 0000 04/06/18 0030 04/06/18 0144  BP: (!) 182/84 (!) 177/78 (!) 165/73 (!) 188/88  Pulse: 80 87  90  Resp: 18 (!) 26 (!) 24 16  Temp:    98.9 F (37.2 C)  TempSrc:    Oral  SpO2: 95% 98%  96%  Weight:      Height:       Eyes: PERRL, lids and conjunctivae normal ENMT: Mucous membranes are moist. Posterior pharynx clear of any exudate or lesions.Normal dentition.  Neck: normal, supple, no masses,  Respiratory: clear to auscultation bilaterally, no wheezing, no crackles. Normal respiratory effort. No accessory muscle use.  Cardiovascular: Regular rate and rhythm, + murmurs / ?  gallops. No extremity edema. 1+ pedal pulses. Abdomen: no tenderness, no masses palpated. No hepatosplenomegaly. Bowel sounds positive.  Musculoskeletal: no clubbing / cyanosis. No joint deformity upper and lower extremities. Good ROM, no contractures. Normal muscle tone.  Skin: no rashes, lesions, ulcers. No induration Neurologic: CN 2-12 grossly intact.. Strength 5/5 in all 4.  Psychiatric: ? judgment and insight. Alert and oriented x 3.  Flat affect odd demeanor answers  questions with one-word answer.    Labs on Admission: I have personally reviewed following labs and imaging studies  CBC: Recent Labs  Lab 04/05/18 2036  WBC 10.6*  NEUTROABS 8.2*  HGB 13.3  HCT 40.5  MCV 90.0  PLT 128   Basic Metabolic Panel: Recent Labs  Lab 04/05/18 2036  NA 140  K 4.0  CL 107  CO2 25  GLUCOSE 119*  BUN 19  CREATININE 0.99  CALCIUM 9.2   GFR: Estimated Creatinine Clearance: 50.1 mL/min (by C-G formula based on SCr of 0.99 mg/dL). Liver Function Tests: Recent Labs  Lab 04/05/18 2036  AST 26  ALT 20  ALKPHOS 71  BILITOT 0.6  PROT 7.2  ALBUMIN 4.1   No results for input(s): LIPASE, AMYLASE in the last 168 hours. Recent Labs  Lab 04/05/18 2036  AMMONIA 22   Coagulation Profile: Recent Labs  Lab 04/05/18 2036  INR 0.87   Cardiac Enzymes: Recent Labs  Lab 04/05/18 2036 04/05/18 2308 04/06/18 0149  TROPONINI 0.11* 0.13* 0.10*   BNP (last 3 results) No results for input(s): PROBNP in the last 8760 hours. HbA1C: No results for input(s): HGBA1C in the last 72 hours. CBG: No results for input(s): GLUCAP in the last 168 hours. Lipid Profile: No results for input(s): CHOL, HDL, LDLCALC, TRIG, CHOLHDL, LDLDIRECT in the last 72 hours. Thyroid Function Tests: Recent Labs    04/06/18 0149  TSH 4.312   Anemia Panel: No results for input(s): VITAMINB12, FOLATE, FERRITIN, TIBC, IRON, RETICCTPCT in the last 72 hours. Urine analysis:    Component Value Date/Time   COLORURINE YELLOW 04/05/2018 1925   APPEARANCEUR HAZY (A) 04/05/2018 1925   LABSPEC 1.016 04/05/2018 1925   PHURINE 9.0 (H) 04/05/2018 1925   GLUCOSEU NEGATIVE 04/05/2018 1925   HGBUR NEGATIVE 04/05/2018 1925   BILIRUBINUR NEGATIVE 04/05/2018 1925   BILIRUBINUR n 05/19/2017 [place] 04/05/2018 1925   PROTEINUR NEGATIVE 04/05/2018  1925   UROBILINOGEN 0.2 05/19/2017 1050   UROBILINOGEN 0.2 06/29/2010 1418   NITRITE NEGATIVE 04/05/2018 1925    LEUKOCYTESUR NEGATIVE 04/05/2018 1925    Radiological Exams on Admission: Dg Chest 2 View  Result Date: 04/05/2018 CLINICAL DATA:  Mental status changes with confusion. Personal history of RIGHT mastectomy for breast cancer. EXAM: CHEST - 2 VIEW COMPARISON:  08/04/2017, 11/28/2016 and earlier. FINDINGS: Cardiac silhouette mildly to moderately enlarged, unchanged. Thoracic aorta mildly atherosclerotic, unchanged. Hilar and mediastinal contours otherwise unremarkable. Scarring involving the lingula and LEFT LOWER LOBE, unchanged. Lungs otherwise clear. No localized airspace consolidation. No pleural effusions. No pneumothorax. Normal pulmonary vascularity. Visualized bony thorax intact. IMPRESSION: 1. No acute cardiopulmonary disease. Stable scarring involving the lingula and LEFT LOWER LOBE. 2. Stable mild-to-moderate cardiomegaly without pulmonary edema. Electronically Signed   By: Evangeline Dakin M.D.   On: 04/05/2018 21:07   Ct Head Wo Contrast  Result Date: 04/05/2018 CLINICAL DATA:  Acute confusion. EXAM: CT HEAD WITHOUT CONTRAST TECHNIQUE: Contiguous axial images were obtained from the base of the skull through the vertex without intravenous contrast. COMPARISON:  CT head 08/04/2017, 05/10/2014 and earlier. MRI brain 04/24/2012. FINDINGS: Brain: Ventricular system normal in size and appearance for age. No mass lesion. No midline shift. No acute hemorrhage or hematoma. No extra-axial fluid collections. No evidence of acute infarction. No focal brain parenchymal abnormalities. Vascular: Mild BILATERAL carotid siphon and RIGHT vertebral artery atherosclerosis. No hyperdense vessel. Skull: No skull fracture or other focal osseous abnormality involving the skull. Sinuses/Orbits: Prior BILATERAL ethmoidectomies. Visualized paranasal sinuses, bilateral mastoid air cells and bilateral middle ear cavities well-aerated. Visualized orbits and globes normal in appearance. Other: None. IMPRESSION: No acute  intracranial abnormality. Electronically Signed   By: Evangeline Dakin M.D.   On: 04/05/2018 21:10   EKG shows sinus rhythm chronic left bundle branch block QTC 511 abnormal EKG  Assessment/Plan Principal Problem:   Elevated troponin Active Problems:   LBBB (left bundle branch block)   Anxiety disorder   Hallucinations   Agitation   HTN (hypertension)   -Cycle cardiac enzymes, continue treatment with aspirin.  Consult cardiology.  Check echocardiogram. -cont home Xanax for anxiety disorder  Once medically cleared she can be evaluated by psychiatry.  Ammonia level within normal limits.  Will check TSH, HIV, and RPR -Cont beta-blocker for history of hypertension.    DVT prophylaxis: early ambulation  Code Status: full  Disposition Plan: home 1-2 day s Consults called: ED called cardiology Dr. Elson Areas Admission status: obs tele    Shelbie Proctor MD [facility] Pager [phone]  If 7PM-7AM, please contact night-coverage [email or web address] Password [email or web address]  04/06/2018, 4:06 AM

## 2018-04-07 LAB — HIV ANTIBODY (ROUTINE TESTING W REFLEX): HIV Screen 4th Generation wRfx: NONREACTIVE

## 2018-04-07 LAB — RPR: RPR Ser Ql: NONREACTIVE

## 2018-04-11 NOTE — Progress Notes (Deleted)
67 y.o. G64P0012 Married Caucasian female here for annual exam.    PCP: Dr Gerarda Fraction    Patient's last menstrual period was 01/10/2003.           Sexually active: {yes no:314532}  The current method of family planning is {contraception:315051}.    Exercising: {yes no:314532}  {types:19826} Smoker:  {YES [id number]  Health Maintenance: Pap:  09/26/2015 normal History of abnormal Pap:  {YES NO:22349} MMG:  06/01/2017 BI-RADS CATEGORY  1: Negative. Colonoscopy:  1998 BMD:   11/29/2014  Result  osteopenia TDaP:  2006 Gardasil:   {YES NO:22349} HIV: non reactive 04/06/2018 Hep C: Screening Labs:  Hb today: ***, Urine today: ***   reports that she has never smoked. She has never used smokeless tobacco. She reports that she does not drink alcohol or use drugs.  Past Medical History:  Diagnosis Date  . Allergy   . Anxiety   . Bilateral ovarian cysts    Do yearly ultrasound.  . Cancer ([place]) rt breast   2004/ chemo/ radiation  . Cellulitis and abscess of upper arm and forearm   . Chronic combined systolic and diastolic CHF (congestive heart failure) ([place])   . Family history of breast cancer   . Family history of rectal cancer   . GERD (gastroesophageal reflux disease)   . H/O Helicobacter infection 2017  . History of right mastectomy 2004   chemo/ radiation  . Hyperlipidemia   . Hypertension   . Insomnia   . Left bundle branch block 2016  . Migraines   . Moderate mitral regurgitation   . NICM (nonischemic cardiomyopathy) ([place])   . PONV (postoperative nausea and vomiting)    cannot take zofran  . Postmastectomy lymphedema rt side  . Sentinel node     Past Surgical History:  Procedure Laterality Date  . BREAST EXCISIONAL BIOPSY Left    benign  . CARPAL TUNNEL RELEASE    . CATARACT EXTRACTION Left 07/12/2013  . MANDIBLE SURGERY  1991  . MASTECTOMY Right    malignant  . NASAL SINUS SURGERY  1990/2001  . ORIF FINGER FRACTURE  02/14/2012   Procedure: OPEN REDUCTION INTERNAL  FIXATION (ORIF) METACARPAL (FINGER) FRACTURE;  Surgeon: Wynonia Sours, MD;  Location: [facility];  Service: Orthopedics;  Laterality: Right;  RIGHT FIFTH   . ORIF METACARPAL FRACTURE  8/13   rt   . PORT-A-CATH REMOVAL     in and now out  . REPAIR EXTENSOR TENDON Left 05/30/2013   Procedure: RELEASE TRANSPOSITION EXTENSOR POLLICUS LONGUS LEFT WRIST;  Surgeon: Wynonia Sours, MD;  Location: [facility];  Service: Orthopedics;  Laterality: Left;  . rt mastectomy  2004   lymph nodes-7-axillary node dissection  . SHOULDER ARTHROSCOPY WITH ROTATOR CUFF REPAIR AND SUBACROMIAL DECOMPRESSION Left 11/22/2013   Procedure: LEFT SHOULDER ARTHROSCOPY WITH SUBACROMIAL DECOMPRESSION DISTAL CLAVICLE RESECTION REPAIR  ROTATOR CUFF AS NEEDED ;  Surgeon: Cammie Sickle., MD;  Location: [facility];  Service: Orthopedics;  Laterality: Left;    Current Outpatient Medications  Medication Sig Dispense Refill  . ALPRAZOLAM XR 3 MG 24 hr tablet Take 3 mg by mouth at bedtime.    . Bacillus Coagulans-Inulin (PROBIOTIC FORMULA PO) Take 1 capsule by mouth daily.    Marland Kitchen BIOTIN PO Take 1 capsule by mouth daily.    . furosemide (LASIX) 40 MG tablet Take 40 mg by mouth daily.     . Garlic (GARLIQUE PO) Take 1 capsule by mouth  daily.    . hydrocortisone 2.5 % cream Apply 1 application topically daily as needed (for skin irritation).     . metoprolol succinate (TOPROL-XL) 50 MG 24 hr tablet Take 1 tablet by mouth every evening.     . Multiple Vitamin (MULTIVITAMIN WITH MINERALS) TABS tablet Take 1 tablet by mouth daily.    Marland Kitchen omeprazole (PRILOSEC) 20 MG capsule Take 1 capsule (20 mg total) by mouth daily. Take one tablet daily 21 capsule 0  . sucralfate (CARAFATE) 1 g tablet Take 1 tablet (1 g total) by mouth 4 (four) times daily -  with meals and at bedtime. 21 tablet 0   No current facility-administered medications for this visit.     Family History  Problem Relation Age of Onset   . Heart disease Father   . Heart disease Mother   . Hypertension Mother   . Rectal cancer Mother        dx in her early 15s  . Colon polyps Mother   . Breast cancer Maternal Aunt        dx in her early 62s  . Heart disease Paternal Uncle   . Heart disease Paternal Grandmother   . Heart disease Paternal Grandfather   . Diabetes Brother   . Breast cancer Maternal Grandmother        dx in her 61s  . Breast cancer Cousin        dx in early 64s  . Breast cancer Cousin 37  . Hepatitis C Son   . Hypertension Son   . Post-traumatic stress disorder Son     Review of Systems  Constitutional: Negative.   HENT: Negative.   Eyes: Negative.   Respiratory: Negative.   Cardiovascular: Negative.   Gastrointestinal: Negative.   Endocrine: Negative.   Genitourinary: Negative.   Musculoskeletal: Negative.   Skin: Negative.   Allergic/Immunologic: Negative.   Neurological: Negative.   Hematological: Negative.   Psychiatric/Behavioral: Negative.   All other systems reviewed and are negative.   Exam:   LMP 01/10/2003     General appearance: alert, cooperative and appears stated age Head: Normocephalic, without obvious abnormality, atraumatic Neck: no adenopathy, supple, symmetrical, trachea midline and thyroid normal to inspection and palpation Lungs: clear to auscultation bilaterally Breasts: normal appearance, no masses or tenderness, No nipple retraction or dimpling, No nipple discharge or bleeding, No axillary or supraclavicular adenopathy Heart: regular rate and rhythm Abdomen: soft, non-tender; no masses, no organomegaly Extremities: extremities normal, atraumatic, no cyanosis or edema Skin: Skin color, texture, turgor normal. No rashes or lesions Lymph nodes: Cervical, supraclavicular, and axillary nodes normal. No abnormal inguinal nodes palpated Neurologic: Grossly normal  Pelvic: External genitalia:  no lesions              Urethra:  normal appearing urethra with no  masses, tenderness or lesions              Bartholins and Skenes: normal                 Vagina: normal appearing vagina with normal color and discharge, no lesions              Cervix: no lesions              Pap taken: {yes no:314532} Bimanual Exam:  Uterus:  normal size, contour, position, consistency, mobility, non-tender              Adnexa: no mass, fullness, tenderness  Rectal exam: {yes no:314532}.  Confirms.              Anus:  normal sphincter tone, no lesions  Chaperone was present for exam.  Assessment:   Well woman visit with normal exam.   Plan: Mammogram screening. Recommended self breast awareness. Pap and HR HPV as above. Guidelines for Calcium, Vitamin D, regular exercise program including cardiovascular and weight bearing exercise.   Follow up annually and prn.   Additional counseling given.  {yes [id number]. _______ minutes face to face time of which over 50% was spent in counseling.    After visit summary provided.

## 2018-04-12 ENCOUNTER — Telehealth: Payer: Self-pay | Admitting: Obstetrics and Gynecology

## 2018-04-12 ENCOUNTER — Ambulatory Visit: Payer: PPO | Admitting: Obstetrics and Gynecology

## 2018-04-12 NOTE — Telephone Encounter (Signed)
Patient's spouse(ok per dpr) called to cancel appointment for today. Please see separate staff message.

## 2018-04-12 NOTE — Telephone Encounter (Signed)
Will wait for patient's husband to call back to schedule her appointment.  Ok to close encounter.

## 2018-04-14 DIAGNOSIS — I10 Essential (primary) hypertension: Secondary | ICD-10-CM | POA: Diagnosis not present

## 2018-04-14 DIAGNOSIS — R443 Hallucinations, unspecified: Secondary | ICD-10-CM | POA: Diagnosis not present

## 2018-04-14 DIAGNOSIS — R451 Restlessness and agitation: Secondary | ICD-10-CM | POA: Diagnosis not present

## 2018-04-14 DIAGNOSIS — E663 Overweight: Secondary | ICD-10-CM | POA: Diagnosis not present

## 2018-04-14 DIAGNOSIS — Z1389 Encounter for screening for other disorder: Secondary | ICD-10-CM | POA: Diagnosis not present

## 2018-04-14 DIAGNOSIS — F419 Anxiety disorder, unspecified: Secondary | ICD-10-CM | POA: Diagnosis not present

## 2018-04-14 DIAGNOSIS — Z6829 Body mass index (BMI) 29.0-29.9, adult: Secondary | ICD-10-CM | POA: Diagnosis not present

## 2018-04-18 ENCOUNTER — Ambulatory Visit: Payer: PPO | Admitting: Obstetrics and Gynecology

## 2018-04-21 ENCOUNTER — Other Ambulatory Visit (HOSPITAL_COMMUNITY)
Admission: RE | Admit: 2018-04-21 | Discharge: 2018-04-21 | Disposition: A | Discharge status: Home / Self Care | Payer: PPO | Source: Ambulatory Visit | Attending: Obstetrics and Gynecology | Admitting: Obstetrics and Gynecology

## 2018-04-21 ENCOUNTER — Ambulatory Visit (INDEPENDENT_AMBULATORY_CARE_PROVIDER_SITE_OTHER): Payer: PPO | Admitting: Obstetrics and Gynecology

## 2018-04-21 ENCOUNTER — Encounter: Payer: Self-pay | Admitting: Obstetrics and Gynecology

## 2018-04-21 VITALS — BP 164/84 | HR 80 | Resp 22 | Ht 62.0 in | Wt 172.0 lb

## 2018-04-21 DIAGNOSIS — N83202 Unspecified ovarian cyst, left side: Secondary | ICD-10-CM

## 2018-04-21 DIAGNOSIS — Z01419 Encounter for gynecological examination (general) (routine) without abnormal findings: Secondary | ICD-10-CM | POA: Insufficient documentation

## 2018-04-21 DIAGNOSIS — N83201 Unspecified ovarian cyst, right side: Secondary | ICD-10-CM | POA: Diagnosis not present

## 2018-04-21 NOTE — Patient Instructions (Addendum)

## 2018-04-21 NOTE — Progress Notes (Signed)
67 y.o. G54P0012 Married Caucasian female here for annual exam.    Denies pelvic pain or vaginal bleeding.   Has vaginal dryness.   PCP:   Redmond School, MD  Patient's last menstrual period was 01/10/2003.           Sexually active: No.  The current method of family planning is post menopausal status.    Exercising: No.   Smoker:  no  Health Maintenance: Pap:  09-26-15 Neg:Neg HR HPV, 08-01-12 Neg  History of abnormal Pap:  Yes, hx cryotherapy to cervix in her 30s  MMG: 06-01-17 Rt.Mast./Lt.MMG Density C/Neg/BiRads1--MRI Neg/BiRads1 Colonoscopy:2015 polyp with Dr. Fredia Beets due 2020   BMD:   12-02-14   Result : Osteopenia. Dr.Magrinat TDaP:  PCP Gardasil:   no HIV:04-06-18 NR Hep C:Neg Screening Labs:  Hb today: PCP/Dr.Magrinat   reports that she has never smoked. She has never used smokeless tobacco. She reports that she does not drink alcohol or use drugs.  Past Medical History:  Diagnosis Date  . Allergy   . Anxiety   . Bilateral ovarian cysts    Do yearly ultrasound.  . Cancer ([place]) rt breast   2004/ chemo/ radiation  . Cellulitis and abscess of upper arm and forearm   . Chronic combined systolic and diastolic CHF (congestive heart failure) ([place])   . Family history of breast cancer   . Family history of rectal cancer   . GERD (gastroesophageal reflux disease)   . H/O Helicobacter infection 2017  . History of right mastectomy 2004   chemo/ radiation  . Hyperlipidemia   . Hypertension   . Insomnia   . Left bundle branch block 2016  . Migraines   . Moderate mitral regurgitation   . NICM (nonischemic cardiomyopathy) ([place])   . PONV (postoperative nausea and vomiting)    cannot take zofran  . Postmastectomy lymphedema rt side  . Sentinel node     Past Surgical History:  Procedure Laterality Date  . BREAST EXCISIONAL BIOPSY Left    benign  . CARPAL TUNNEL RELEASE    . CATARACT EXTRACTION Left 07/12/2013  . MANDIBLE SURGERY  1991  . MASTECTOMY Right     malignant  . NASAL SINUS SURGERY  1990/2001  . ORIF FINGER FRACTURE  02/14/2012   Procedure: OPEN REDUCTION INTERNAL FIXATION (ORIF) METACARPAL (FINGER) FRACTURE;  Surgeon: Wynonia Sours, MD;  Location: [place];  Service: Orthopedics;  Laterality: Right;  RIGHT FIFTH   . ORIF METACARPAL FRACTURE  8/13   rt   . PORT-A-CATH REMOVAL     in and now out  . REPAIR EXTENSOR TENDON Left 05/30/2013   Procedure: RELEASE TRANSPOSITION EXTENSOR POLLICUS LONGUS LEFT WRIST;  Surgeon: Wynonia Sours, MD;  Location: [place];  Service: Orthopedics;  Laterality: Left;  . rt mastectomy  2004   lymph nodes-7-axillary node dissection  . SHOULDER ARTHROSCOPY WITH ROTATOR CUFF REPAIR AND SUBACROMIAL DECOMPRESSION Left 11/22/2013   Procedure: LEFT SHOULDER ARTHROSCOPY WITH SUBACROMIAL DECOMPRESSION DISTAL CLAVICLE RESECTION REPAIR  ROTATOR CUFF AS NEEDED ;  Surgeon: Cammie Sickle., MD;  Location: [place];  Service: Orthopedics;  Laterality: Left;    Current Outpatient Medications  Medication Sig Dispense Refill  . ALPRAZOLAM XR 3 MG 24 hr tablet Take 3 mg by mouth at bedtime.    . Bacillus Coagulans-Inulin (PROBIOTIC FORMULA PO) Take 1 capsule by mouth daily.    Marland Kitchen BIOTIN PO Take 1 capsule by mouth daily.    Marland Kitchen  furosemide (LASIX) 40 MG tablet Take 40 mg by mouth daily.     . Garlic (GARLIQUE PO) Take 1 capsule by mouth daily.    . hydrocortisone 2.5 % cream Apply 1 application topically daily as needed (for skin irritation).     . metoprolol succinate (TOPROL-XL) 50 MG 24 hr tablet Take 1 tablet by mouth every evening.     . Multiple Vitamin (MULTIVITAMIN WITH MINERALS) TABS tablet Take 1 tablet by mouth daily.    Marland Kitchen omeprazole (PRILOSEC) 20 MG capsule Take 1 capsule (20 mg total) by mouth daily. Take one tablet daily 21 capsule 0  . sucralfate (CARAFATE) 1 g tablet Take 1 tablet (1 g total) by mouth 4 (four) times daily -  with meals and at bedtime. 21 tablet 0    No current facility-administered medications for this visit.     Family History  Problem Relation Age of Onset  . Heart disease Father   . Heart disease Mother   . Hypertension Mother   . Rectal cancer Mother        dx in her early 20s  . Colon polyps Mother   . Breast cancer Maternal Aunt        dx in her early 20s  . Heart disease Paternal Uncle   . Heart disease Paternal Grandmother   . Heart disease Paternal Grandfather   . Diabetes Brother   . Breast cancer Maternal Grandmother        dx in her 41s  . Breast cancer Cousin        dx in early 58s  . Breast cancer Cousin 51  . Hepatitis C Son   . Hypertension Son   . Post-traumatic stress disorder Son     Review of Systems  Constitutional: Negative.   HENT: Negative.   Eyes: Negative.   Respiratory: Negative.   Cardiovascular: Negative.   Gastrointestinal: Negative.   Endocrine: Negative.   Genitourinary: Negative.   Musculoskeletal: Negative.   Skin: Negative.   Allergic/Immunologic: Negative.   Neurological: Negative.   Hematological: Negative.   Psychiatric/Behavioral: Negative.   All other systems reviewed and are negative.   Exam:   BP (!) 164/84 (BP Location: Left Arm, Patient Position: Sitting, Cuff Size: Large)   Pulse 80   Resp (!) 22   Ht 5\' 2"  (1.575 m)   Wt 172 lb (78 kg)   LMP 01/10/2003   BMI 31.46 kg/m     General appearance: alert, cooperative and appears stated age Head: Normocephalic, without obvious abnormality, atraumatic Neck: no adenopathy, supple, symmetrical, trachea midline and thyroid normal to inspection and palpation Lungs: clear to auscultation bilaterally Breasts: right breast absent.  Left breast - no masses or tenderness, No nipple retraction or dimpling, No nipple discharge or bleeding, No axillary or supraclavicular adenopathy Heart: regular rate and rhythm Abdomen: obese, soft, non-tender; no masses, no organomegaly Extremities: extremities normal, atraumatic, no  cyanosis or edema Skin: Skin color, texture, turgor normal. No rashes or lesions Lymph nodes: Cervical, supraclavicular, and axillary nodes normal. No abnormal inguinal nodes palpated Neurologic: Grossly normal  Pelvic: External genitalia:  no lesions              Urethra:  normal appearing urethra with no masses, tenderness or lesions              Bartholins and Skenes: normal                 Vagina: normal appearing vagina with normal  color and discharge, no lesions              Cervix:  Not able to see well.  Speculum uncomfortable.               Pap taken: Yes.   Bimanual Exam:  Uterus:  normal size, contour, position, consistency, mobility, non-tender.  Exam limited by [facility].               Adnexa: no mass, fullness, tenderness              Rectal exam: Yes.  .  Confirms.              Anus:  normal sphincter tone, no lesions  Chaperone was present for exam.  Assessment:   Well woman visit with normal exam. Bilateral ovarian cysts.  Hx right breast cancer.   Status post right mastectomy.  Osteopenia.   Plan: Mammogram through Dr. Jana Hakim.  Recommended self breast awareness. Pap and HR HPV as above. Guidelines for Calcium, Vitamin D, regular exercise program including cardiovascular and weight bearing exercise. Return for pelvic US and CA125.  Husband present for scheduling.  We discussed water based products for vaginal dryness - [place] Jelly, cooking oils.  Follow up annually and prn.    After visit summary provided.

## 2018-04-21 NOTE — Progress Notes (Signed)
Patient scheduled for PUS while in office, spouse present. Patient needs late afternoon appointment. PUS scheduled for 05/11/18 at 3:30pm with consult to follow at 4pm with Dr. Quincy Simmonds. Patient and spouse verbalize understanding and are agreeable.

## 2018-04-24 ENCOUNTER — Telehealth: Payer: Self-pay | Admitting: Obstetrics and Gynecology

## 2018-04-24 LAB — CYTOLOGY - PAP: Diagnosis: NEGATIVE

## 2018-04-24 NOTE — Telephone Encounter (Signed)
Call placed to patient to convey benefits for a scheduled ultrasound. Left voicemail message requesting a return call.

## 2018-04-25 NOTE — Telephone Encounter (Signed)
Second call placed to review benefits for scheduled ultrasound. Left voicemail message requesting a return call

## 2018-04-27 NOTE — Telephone Encounter (Signed)
Patient returned call. Spoke with patient regarding benefit for scheduled ultrasound. Patient understood and agreeable. Patient scheduled 05/11/18 with Sr Quincy Simmonds. Patient aware of appointment date, arrival time and cancellation policy. No further questions. Ok to close

## 2018-05-04 ENCOUNTER — Telehealth: Payer: Self-pay | Admitting: Obstetrics and Gynecology

## 2018-05-04 NOTE — Telephone Encounter (Signed)
Call placed to patient to reschedule ultrasound appointment with Dr Quincy Simmonds on 05/11/18. Ultrasound clinic on Thursday afternoon, 05/11/18, has been cancelled. Left voicemail message requesting a return call to [place] and [name].   cc: Laverda Sorenson

## 2018-05-05 NOTE — Telephone Encounter (Signed)
Patient's husband returning Andrea's call.

## 2018-05-05 NOTE — Telephone Encounter (Signed)
Call to patient to reschedule ultrasound appointment, call was disconnected twice by patient. Call to patient's husband (POA) and left message for him to call me back to reschedule.

## 2018-05-08 ENCOUNTER — Other Ambulatory Visit: Payer: Self-pay

## 2018-05-08 DIAGNOSIS — C50911 Malignant neoplasm of unspecified site of right female breast: Secondary | ICD-10-CM

## 2018-05-08 DIAGNOSIS — Z170 Estrogen receptor positive status [ER+]: Principal | ICD-10-CM

## 2018-05-09 ENCOUNTER — Inpatient Hospital Stay: Payer: PPO

## 2018-05-09 ENCOUNTER — Inpatient Hospital Stay: Payer: PPO | Attending: Oncology | Admitting: Oncology

## 2018-05-09 ENCOUNTER — Telehealth: Payer: Self-pay | Admitting: Oncology

## 2018-05-09 DIAGNOSIS — Z853 Personal history of malignant neoplasm of breast: Secondary | ICD-10-CM | POA: Insufficient documentation

## 2018-05-09 DIAGNOSIS — Z9221 Personal history of antineoplastic chemotherapy: Secondary | ICD-10-CM | POA: Diagnosis not present

## 2018-05-09 DIAGNOSIS — Z170 Estrogen receptor positive status [ER+]: Secondary | ICD-10-CM

## 2018-05-09 DIAGNOSIS — Z923 Personal history of irradiation: Secondary | ICD-10-CM | POA: Diagnosis not present

## 2018-05-09 DIAGNOSIS — C50511 Malignant neoplasm of lower-outer quadrant of right female breast: Secondary | ICD-10-CM | POA: Insufficient documentation

## 2018-05-09 DIAGNOSIS — Z9011 Acquired absence of right breast and nipple: Secondary | ICD-10-CM | POA: Diagnosis not present

## 2018-05-09 NOTE — Telephone Encounter (Signed)
Gave avs and calenadar
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
## 2018-05-11 ENCOUNTER — Other Ambulatory Visit: Payer: PPO

## 2018-05-11 ENCOUNTER — Other Ambulatory Visit: Payer: PPO | Admitting: Obstetrics and Gynecology

## 2018-05-16 ENCOUNTER — Ambulatory Visit: Payer: PPO

## 2018-05-16 DIAGNOSIS — N83201 Unspecified ovarian cyst, right side: Secondary | ICD-10-CM | POA: Diagnosis not present

## 2018-05-16 DIAGNOSIS — N83202 Unspecified ovarian cyst, left side: Secondary | ICD-10-CM | POA: Diagnosis not present

## 2018-05-16 NOTE — Progress Notes (Signed)
Encounter reviewed by Dr. Brook Amundson C. Silva.  

## 2018-05-17 ENCOUNTER — Other Ambulatory Visit: Payer: Self-pay

## 2018-05-17 ENCOUNTER — Ambulatory Visit (INDEPENDENT_AMBULATORY_CARE_PROVIDER_SITE_OTHER): Payer: PPO | Admitting: Obstetrics and Gynecology

## 2018-05-17 ENCOUNTER — Encounter: Payer: Self-pay | Admitting: Obstetrics and Gynecology

## 2018-05-17 VITALS — BP 162/80 | HR 90 | Ht 62.0 in | Wt 173.0 lb

## 2018-05-17 DIAGNOSIS — N83201 Unspecified ovarian cyst, right side: Secondary | ICD-10-CM | POA: Diagnosis not present

## 2018-05-17 DIAGNOSIS — N83202 Unspecified ovarian cyst, left side: Secondary | ICD-10-CM | POA: Diagnosis not present

## 2018-05-17 NOTE — Progress Notes (Signed)
GYNECOLOGY  VISIT   HPI: 67 y.o.   Married  Caucasian  female   [phone] with Patient's last menstrual period was 01/10/2003.   here for PUS consult.    Husband is present for the visit today.   Has bilateral ovarian cysts which have been followed with periodic ultrasounds and CA125 levels.  She has had consultation with Dr. Everitt Amber 06/13/17 and has declined surgical intervention.   No vaginal bleeding.  No pelvic pain.  Some bloating, nausea, and diarrhea. Weight gain.   GYNECOLOGIC HISTORY: Patient's last menstrual period was 01/10/2003. Contraception:  Postmenopausal Menopausal hormone therapy: none Last mammogram: 06-01-17 Rt.Mast./Lt.MMG Density C/Neg/BiRads1--MRI Neg/BiRads1 Last pap smear: 04-21-18 Neg, 09-26-15 Neg:Neg HR HPV                                      OB History    Gravida  3   Para  2   Term      Preterm      AB  1   Living  2     SAB  0   TAB  1   Ectopic      Multiple      Live Births                 Patient Active Problem List   Diagnosis Date Noted  . Malignant neoplasm of lower-outer quadrant of right breast of female, estrogen receptor positive ([place]) 05/09/2018  . Elevated troponin 04/06/2018  . Hallucinations 04/06/2018  . Agitation 04/06/2018  . HTN (hypertension) 04/06/2018  . Bilateral ovarian cysts 05/20/2017  . Hyponatremia 11/28/2016  . N&V (nausea and vomiting) 11/28/2016  . Abdominal pain 11/28/2016  . Sleep talking 07/22/2016  . Genetic testing 06/01/2016  . Family history of breast cancer   . Family history of rectal cancer   . Chest pain 09/23/2015  . Abnormal vision 06/13/2015  . Anxiety disorder 06/13/2015  . Cataract cortical, senile 06/13/2015  . Clinical depression 06/13/2015  . Cephalalgia 06/13/2015  . H/O malignant neoplasm of breast 06/13/2015  . 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 Sinus infection 06/13/2015  . Long term current use of systemic steroids 06/13/2015  . Chronic systolic CHF  (congestive heart failure), NYHA class 1 ([place]) 05/09/2015  . LBBB (left bundle branch block) 03/26/2015  . Lymphedema of arm 12/13/2013  . Complete rupture of rotator cuff 11/22/2013  . Nipple discharge in female 12/01/2012  . History of breast cancer 11/30/2012  . Epiphora 11/28/2012  . Disorder of endocrine system 04/25/2012  . Ceratitis 04/25/2012  . Blepharitis 04/25/2012  . Breast cancer, right breast ([place])   . FATIGUE 10/01/2009  . 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 Dyspnea 10/01/2009  . HYPERLIPIDEMIA 09/30/2009    Past Medical History:  Diagnosis Date  . Allergy   . Anxiety   . Bilateral ovarian cysts    Do yearly ultrasound.  . Cancer ([place]) rt breast   2004/ chemo/ radiation  . Cellulitis and abscess of upper arm and forearm   . Chronic combined systolic and diastolic CHF (congestive heart failure) ([place])   . Family history of breast cancer   . Family history of rectal cancer   . GERD (gastroesophageal reflux disease)   . H/O Helicobacter infection 2017  . History of right mastectomy 2004   chemo/ radiation  . Hyperlipidemia   . Hypertension   . Insomnia   . Left  bundle branch block 2016  . Migraines   . Moderate mitral regurgitation   . NICM (nonischemic cardiomyopathy) ([place])   . PONV (postoperative nausea and vomiting)    cannot take zofran  . Postmastectomy lymphedema rt side  . Sentinel node     Past Surgical History:  Procedure Laterality Date  . BREAST EXCISIONAL BIOPSY Left    benign  . CARPAL TUNNEL RELEASE    . CATARACT EXTRACTION Left 07/12/2013  . MANDIBLE SURGERY  1991  . MASTECTOMY Right    malignant  . NASAL SINUS SURGERY  1990/2001  . ORIF FINGER FRACTURE  02/14/2012   Procedure: OPEN REDUCTION INTERNAL FIXATION (ORIF) METACARPAL (FINGER) FRACTURE;  Surgeon: Wynonia Sours, MD;  Location: [facility];  Service: Orthopedics;  Laterality: Right;  RIGHT FIFTH   . ORIF METACARPAL FRACTURE  8/13   rt   . PORT-A-CATH REMOVAL     in and  now out  . REPAIR EXTENSOR TENDON Left 05/30/2013   Procedure: RELEASE TRANSPOSITION EXTENSOR POLLICUS LONGUS LEFT WRIST;  Surgeon: Wynonia Sours, MD;  Location: [facility];  Service: Orthopedics;  Laterality: Left;  . rt mastectomy  2004   lymph nodes-7-axillary node dissection  . SHOULDER ARTHROSCOPY WITH ROTATOR CUFF REPAIR AND SUBACROMIAL DECOMPRESSION Left 11/22/2013   Procedure: LEFT SHOULDER ARTHROSCOPY WITH SUBACROMIAL DECOMPRESSION DISTAL CLAVICLE RESECTION REPAIR  ROTATOR CUFF AS NEEDED ;  Surgeon: Cammie Sickle., MD;  Location: [facility];  Service: Orthopedics;  Laterality: Left;    Current Outpatient Medications  Medication Sig Dispense Refill  . ALPRAZOLAM XR 3 MG 24 hr tablet Take 3 mg by mouth at bedtime.    . Bacillus Coagulans-Inulin (PROBIOTIC FORMULA PO) Take 1 capsule by mouth daily.    Marland Kitchen BIOTIN PO Take 1 capsule by mouth daily.    . furosemide (LASIX) 40 MG tablet Take 40 mg by mouth daily.     . Garlic (GARLIQUE PO) Take 1 capsule by mouth daily.    . hydrocortisone 2.5 % cream Apply 1 application topically daily as needed (for skin irritation).     . metoprolol succinate (TOPROL-XL) 50 MG 24 hr tablet Take 1 tablet by mouth every evening.     . Multiple Vitamin (MULTIVITAMIN WITH MINERALS) TABS tablet Take 1 tablet by mouth daily.    Marland Kitchen omeprazole (PRILOSEC) 20 MG capsule Take 1 capsule (20 mg total) by mouth daily. Take one tablet daily 21 capsule 0  . sucralfate (CARAFATE) 1 g tablet Take 1 tablet (1 g total) by mouth 4 (four) times daily -  with meals and at bedtime. 21 tablet 0   No current facility-administered medications for this visit.      ALLERGIES: Acetaminophen; Amoxicillin-pot clavulanate; Aspirin; Azithromycin; Cefuroxime axetil; Cephalexin; Clonazepam; Hydrocodone-acetaminophen; Hydrocodone-acetaminophen; Levofloxacin; Milk-related compounds; Morphine and related; Ondansetron; Propoxyphene n-acetaminophen; Rosuvastatin;  Sulfonamide derivatives; Tramadol; Trazodone and nefazodone; Vioxx [rofecoxib]; Zofran [ondansetron hcl]; Clarithromycin; Codeine; Seroquel [quetiapine fumarate]; and Sulfamethoxazole  Family History  Problem Relation Age of Onset  . Heart disease Father   . Heart disease Mother   . Hypertension Mother   . Rectal cancer Mother        dx in her early 5s  . Colon polyps Mother   . Breast cancer Maternal Aunt        dx in her early 40s  . Heart disease Paternal Uncle   . Heart disease Paternal Grandmother   . Heart disease Paternal Grandfather   . Diabetes  Brother   . Breast cancer Maternal Grandmother        dx in her 71s  . Breast cancer Cousin        dx in early 34s  . Breast cancer Cousin 79  . Hepatitis C Son   . Hypertension Son   . Post-traumatic stress disorder Son     Social History   Socioeconomic History  . Marital status: Married    Spouse name: Not on file  . Number of children: Not on file  . Years of education: Not on file  . Highest education level: Not on file  Occupational History  . Occupation: retired  Scientific laboratory technician  . Financial resource strain: Patient refused  . Food insecurity:    Worry: Patient refused    Inability: Patient refused  . Transportation needs:    Medical: Patient refused    Non-medical: Patient refused  Tobacco Use  . Smoking status: Never Smoker  . Smokeless tobacco: Never Used  Substance and Sexual Activity  . Alcohol use: No    Alcohol/week: 0.0 standard drinks  . Drug use: No  . Sexual activity: Not Currently    Partners: Male    Birth control/protection: Post-menopausal  Lifestyle  . Physical activity:    Days per week: Patient refused    Minutes per session: Patient refused  . Stress: Patient refused  Relationships  . Social connections:    Talks on phone: Patient refused    Gets together: Patient refused    Attends religious service: Patient refused    Active member of club or organization: Patient refused     Attends meetings of clubs or organizations: Patient refused    Relationship status: Patient refused  . Intimate partner violence:    Fear of current or ex partner: Patient refused    Emotionally abused: Patient refused    Physically abused: Patient refused    Forced sexual activity: Patient refused  Other Topics Concern  . Not on file  Social History Narrative  . Not on file    Review of Systems  Gastrointestinal: Positive for nausea.       Bloating  All other systems reviewed and are negative.   PHYSICAL EXAMINATION:    BP (!) 162/80 (BP Location: Right Arm, Patient Position: Sitting, Cuff Size: Large)   Pulse 90   Ht 5\' 2"  (1.575 m)   Wt 173 lb (78.5 kg)   LMP 01/10/2003   BMI 31.64 kg/m     General appearance: alert, cooperative and appears stated age  Pelvic US Uterus no masses.  Right ovary with cystic mass (change) 53 x 43 x 43 mm.  (Was 44 x 34 x 34 mm on 05/19/17.)  Left ovary with cystic mass (change) 52 x 33 x  29 mm.  (Was 38 x 20 x 37 mm on 05/19/17.) No free fluid.  ASSESSMENT  Bilateral ovarian cysts.  Slowly increasing in size.  Normal CA 125 in past.   PLAN  We discussed the cysts today and my expectation that they are benign cystadenomas. I have shared that I do not expect for these to resolve spontaneously and that with increase in size, there can be risk of torsion and need for emergency surgical care.  Signs and symptoms of torsion reviewed.  Patient declines surgical intervention.  Will check CA125.  Plan for ultrasound in 6 months.  Questions invited and answered.   An After Visit Summary was printed and given to the patient.  _15_____ minutes  face to face time of which over 50% was spent in counseling.

## 2018-05-18 LAB — CA 125: Cancer Antigen (CA) 125: 12.2 U/mL (ref 0.0–38.1)

## 2018-05-19 ENCOUNTER — Telehealth: Payer: Self-pay | Admitting: Emergency Medicine

## 2018-05-19 NOTE — Telephone Encounter (Signed)
-----   Message from Nunzio Cobbs, MD sent at 05/19/2018  6:51 AM EST ----- Please report normal CA125 to patient and husband if on the DPI.  Our plan is for a pelvic ultrasound in 6 months for bilateral ovarian cysts.  I have already placed a future order.

## 2018-05-19 NOTE — Telephone Encounter (Signed)
Call to patient phone per designated party release form [phone]. Unable to leave voicemail as mailbox was full.   Spoke with husband, he is on designated party release form.  Results given and he is agreeable to plan, will notify his wife of the plan.  Encounter closed.

## 2018-06-14 ENCOUNTER — Other Ambulatory Visit: Payer: Self-pay

## 2018-06-14 ENCOUNTER — Institutional Professional Consult (permissible substitution): Payer: PPO | Admitting: Neurology

## 2018-06-14 ENCOUNTER — Emergency Department (HOSPITAL_COMMUNITY)
Admission: EM | Admit: 2018-06-14 | Discharge: 2018-06-15 | Disposition: A | Discharge status: Other Acute Inpatient Hospital | Payer: PPO | Attending: Emergency Medicine | Admitting: Emergency Medicine

## 2018-06-14 ENCOUNTER — Encounter (HOSPITAL_COMMUNITY): Payer: Self-pay | Admitting: *Deleted

## 2018-06-14 ENCOUNTER — Telehealth: Payer: Self-pay | Admitting: Neurology

## 2018-06-14 DIAGNOSIS — I5042 Chronic combined systolic (congestive) and diastolic (congestive) heart failure: Secondary | ICD-10-CM | POA: Diagnosis not present

## 2018-06-14 DIAGNOSIS — I110 Hypertensive heart disease with heart failure: Secondary | ICD-10-CM | POA: Insufficient documentation

## 2018-06-14 DIAGNOSIS — F419 Anxiety disorder, unspecified: Secondary | ICD-10-CM | POA: Diagnosis not present

## 2018-06-14 DIAGNOSIS — Z853 Personal history of malignant neoplasm of breast: Secondary | ICD-10-CM | POA: Insufficient documentation

## 2018-06-14 DIAGNOSIS — F0391 Unspecified dementia with behavioral disturbance: Secondary | ICD-10-CM | POA: Diagnosis not present

## 2018-06-14 DIAGNOSIS — Z046 Encounter for general psychiatric examination, requested by authority: Secondary | ICD-10-CM | POA: Insufficient documentation

## 2018-06-14 DIAGNOSIS — Z79899 Other long term (current) drug therapy: Secondary | ICD-10-CM | POA: Insufficient documentation

## 2018-06-14 DIAGNOSIS — I10 Essential (primary) hypertension: Secondary | ICD-10-CM | POA: Diagnosis not present

## 2018-06-14 DIAGNOSIS — F918 Other conduct disorders: Secondary | ICD-10-CM | POA: Diagnosis not present

## 2018-06-14 NOTE — Telephone Encounter (Signed)
Pt called morning of and cancelled the apt. No show for today's new pt apt

## 2018-06-14 NOTE — ED Triage Notes (Signed)
Pt arrived to er with [employer] after spouse took out IVC paperwork because he felt that pt was a harm to herself and would act out aggressively towards him, pt denies any SI or HI,

## 2018-06-15 DIAGNOSIS — F0391 Unspecified dementia with behavioral disturbance: Secondary | ICD-10-CM | POA: Diagnosis not present

## 2018-06-15 DIAGNOSIS — G3184 Mild cognitive impairment, so stated: Secondary | ICD-10-CM | POA: Diagnosis not present

## 2018-06-15 DIAGNOSIS — F323 Major depressive disorder, single episode, severe with psychotic features: Secondary | ICD-10-CM | POA: Diagnosis not present

## 2018-06-15 LAB — RAPID URINE DRUG SCREEN, HOSP PERFORMED
Amphetamines: NOT DETECTED
BARBITURATES: NOT DETECTED
Benzodiazepines: POSITIVE — AB
Cocaine: NOT DETECTED
Opiates: NOT DETECTED
Tetrahydrocannabinol: NOT DETECTED

## 2018-06-15 LAB — URINALYSIS, ROUTINE W REFLEX MICROSCOPIC
Bacteria, UA: NONE SEEN
Bilirubin Urine: NEGATIVE
GLUCOSE, UA: NEGATIVE mg/dL
Ketones, ur: NEGATIVE mg/dL
Leukocytes, UA: NEGATIVE
NITRITE: NEGATIVE
Protein, ur: NEGATIVE mg/dL
Specific Gravity, Urine: 1.018 (ref 1.005–1.030)
pH: 5 (ref 5.0–8.0)

## 2018-06-15 LAB — CBC WITH DIFFERENTIAL/PLATELET
Abs Immature Granulocytes: 0.02 10*3/uL (ref 0.00–0.07)
BASOS PCT: 1 %
Basophils Absolute: 0.1 10*3/uL (ref 0.0–0.1)
Eosinophils Absolute: 0.1 10*3/uL (ref 0.0–0.5)
Eosinophils Relative: 1 %
HCT: 42.4 % (ref 36.0–46.0)
Hemoglobin: 13.1 g/dL (ref 12.0–15.0)
Immature Granulocytes: 0 %
Lymphocytes Relative: 24 %
Lymphs Abs: 1.7 10*3/uL (ref 0.7–4.0)
MCH: 28.7 pg (ref 26.0–34.0)
MCHC: 30.9 g/dL (ref 30.0–36.0)
MCV: 92.8 fL (ref 80.0–100.0)
Monocytes Absolute: 0.5 10*3/uL (ref 0.1–1.0)
Monocytes Relative: 7 %
NRBC: 0 % (ref 0.0–0.2)
Neutro Abs: 5 10*3/uL (ref 1.7–7.7)
Neutrophils Relative %: 67 %
Platelets: 369 10*3/uL (ref 150–400)
RBC: 4.57 MIL/uL (ref 3.87–5.11)
RDW: 14.5 % (ref 11.5–15.5)
WBC: 7.4 10*3/uL (ref 4.0–10.5)

## 2018-06-15 LAB — ETHANOL: Alcohol, Ethyl (B): <10 mg/dL (ref <10)

## 2018-06-15 LAB — COMPREHENSIVE METABOLIC PANEL
ALT: 17 U/L (ref 0–44)
AST: 22 U/L (ref 15–41)
Albumin: 3.8 g/dL (ref 3.5–5.0)
Alkaline Phosphatase: 77 U/L (ref 38–126)
Anion gap: 8 (ref 5–15)
BUN: 19 mg/dL (ref 8–23)
CO2: 23 mmol/L (ref 22–32)
Calcium: 8.7 mg/dL — ABNORMAL LOW (ref 8.9–10.3)
Chloride: 108 mmol/L (ref 98–111)
Creatinine, Ser: 1 mg/dL (ref 0.44–1.00)
GFR calc Af Amer: >60 mL/min (ref >60)
GFR calc non Af Amer: 58 mL/min — ABNORMAL LOW (ref >60)
Glucose, Bld: 125 mg/dL — ABNORMAL HIGH (ref 70–99)
Potassium: 3.8 mmol/L (ref 3.5–5.1)
Sodium: 139 mmol/L (ref 135–145)
TOTAL PROTEIN: 7.2 g/dL (ref 6.5–8.1)
Total Bilirubin: 0.2 mg/dL — ABNORMAL LOW (ref 0.3–1.2)

## 2018-06-15 MED ORDER — ALPRAZOLAM ER 1 MG PO TB24: 2.0000 mg | ORAL_TABLET | Freq: Every day | ORAL | Status: DC

## 2018-06-15 MED ORDER — METOPROLOL SUCCINATE ER 25 MG PO TB24
50.0000 mg | ORAL_TABLET | Freq: Every evening | ORAL | Status: DC
  Administered 2018-06-15: 50 mg via ORAL
  Filled 2018-06-15: qty 2

## 2018-06-15 MED ORDER — FUROSEMIDE 40 MG PO TABS
40.0000 mg | ORAL_TABLET | Freq: Every day | ORAL | Status: DC
  Administered 2018-06-15: 40 mg via ORAL
  Filled 2018-06-15: qty 1

## 2018-06-15 NOTE — ED Provider Notes (Signed)
[facility] EMERGENCY DEPARTMENT Provider Note   CSN: [id number] Arrival date & time: 06/14/18  2324     History   Chief Complaint Chief Complaint  Patient presents with  . V70.1    HPI Kylie Tucker is a 67 y.o. female.  The history is provided by the spouse and the patient.  Mental Health Problem  Presenting symptoms: aggressive behavior   Patient accompanied by:  Law enforcement Degree of incapacity (severity):  Severe Onset quality:  Gradual Timing:  Constant Progression:  Worsening Chronicity:  Recurrent Treatment compliance:  Untreated Relieved by:  Nothing Worsened by:  Nothing Associated symptoms: no chest pain   Patient presents from home for IVC.  Her husband took out IVC paperwork due to her aggressive behavior, threatening harm to him as well as destroying their house.  Patient adamantly denies any issues.  She denies SI.  She denies having any aggressive behavior. She has no other acute complaints  Past Medical History:  Diagnosis Date  . Allergy   . Anxiety   . Bilateral ovarian cysts    Do yearly ultrasound.  . Cancer ([place]) rt breast   2004/ chemo/ radiation  . Cellulitis and abscess of upper arm and forearm   . Chronic combined systolic and diastolic CHF (congestive heart failure) ([place])   . Family history of breast cancer   . Family history of rectal cancer   . GERD (gastroesophageal reflux disease)   . H/O Helicobacter infection 2017  . History of right mastectomy 2004   chemo/ radiation  . Hyperlipidemia   . Hypertension   . Insomnia   . Left bundle branch block 2016  . Migraines   . Moderate mitral regurgitation   . NICM (nonischemic cardiomyopathy) ([place])   . PONV (postoperative nausea and vomiting)    cannot take zofran  . Postmastectomy lymphedema rt side  . Sentinel node     Patient Active Problem List   Diagnosis Date Noted  . Malignant neoplasm of lower-outer quadrant of right breast of female, estrogen receptor positive  ([place]) 05/09/2018  . Elevated troponin 04/06/2018  . Hallucinations 04/06/2018  . Agitation 04/06/2018  . HTN (hypertension) 04/06/2018  . Bilateral ovarian cysts 05/20/2017  . Hyponatremia 11/28/2016  . N&V (nausea and vomiting) 11/28/2016  . Abdominal pain 11/28/2016  . Sleep talking 07/22/2016  . Genetic testing 06/01/2016  . Family history of breast cancer   . Family history of rectal cancer   . Chest pain 09/23/2015  . Abnormal vision 06/13/2015  . Anxiety disorder 06/13/2015  . Cataract cortical, senile 06/13/2015  . Clinical depression 06/13/2015  . Cephalalgia 06/13/2015  . H/O malignant neoplasm of breast 06/13/2015  . 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 Sinus infection 06/13/2015  . Long term current use of systemic steroids 06/13/2015  . Chronic systolic CHF (congestive heart failure), NYHA class 1 ([place]) 05/09/2015  . LBBB (left bundle branch block) 03/26/2015  . Lymphedema of arm 12/13/2013  . Complete rupture of rotator cuff 11/22/2013  . Nipple discharge in female 12/01/2012  . History of breast cancer 11/30/2012  . Epiphora 11/28/2012  . Disorder of endocrine system 04/25/2012  . Ceratitis 04/25/2012  . Blepharitis 04/25/2012  . Breast cancer, right breast ([place])   . FATIGUE 10/01/2009  . 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 Dyspnea 10/01/2009  . HYPERLIPIDEMIA 09/30/2009    Past Surgical History:  Procedure Laterality Date  . BREAST EXCISIONAL BIOPSY Left    benign  . CARPAL TUNNEL RELEASE    .  CATARACT EXTRACTION Left 07/12/2013  . MANDIBLE SURGERY  1991  . MASTECTOMY Right    malignant  . NASAL SINUS SURGERY  1990/2001  . ORIF FINGER FRACTURE  02/14/2012   Procedure: OPEN REDUCTION INTERNAL FIXATION (ORIF) METACARPAL (FINGER) FRACTURE;  Surgeon: Wynonia Sours, MD;  Location: [facility];  Service: Orthopedics;  Laterality: Right;  RIGHT FIFTH   . ORIF METACARPAL FRACTURE  8/13   rt   . PORT-A-CATH REMOVAL     in and now out  . REPAIR EXTENSOR TENDON  Left 05/30/2013   Procedure: RELEASE TRANSPOSITION EXTENSOR POLLICUS LONGUS LEFT WRIST;  Surgeon: Wynonia Sours, MD;  Location: [facility];  Service: Orthopedics;  Laterality: Left;  . rt mastectomy  2004   lymph nodes-7-axillary node dissection  . SHOULDER ARTHROSCOPY WITH ROTATOR CUFF REPAIR AND SUBACROMIAL DECOMPRESSION Left 11/22/2013   Procedure: LEFT SHOULDER ARTHROSCOPY WITH SUBACROMIAL DECOMPRESSION DISTAL CLAVICLE RESECTION REPAIR  ROTATOR CUFF AS NEEDED ;  Surgeon: Cammie Sickle., MD;  Location: [facility];  Service: Orthopedics;  Laterality: Left;     OB History    Gravida  3   Para  2   Term      Preterm      AB  1   Living  2     SAB  0   TAB  1   Ectopic      Multiple      Live Births               Home Medications    Prior to Admission medications   Medication Sig Start Date End Date Taking? Authorizing Provider  ALPRAZOLAM XR 3 MG 24 hr tablet Take 3 mg by mouth at bedtime. 06/30/16   [provider]  Bacillus Coagulans-Inulin (PROBIOTIC FORMULA PO) Take 1 capsule by mouth daily.    [provider]  BIOTIN PO Take 1 capsule by mouth daily.    [provider]  furosemide (LASIX) 40 MG tablet Take 40 mg by mouth daily.  11/24/16   [provider]  Garlic (GARLIQUE PO) Take 1 capsule by mouth daily.    [provider]  hydrocortisone 2.5 % cream Apply 1 application topically daily as needed (for skin irritation).  10/19/17   [provider]  metoprolol succinate (TOPROL-XL) 50 MG 24 hr tablet Take 1 tablet by mouth every evening.  08/23/16   [provider]  Multiple Vitamin (MULTIVITAMIN WITH MINERALS) TABS tablet Take 1 tablet by mouth daily.    [provider]  omeprazole (PRILOSEC) 20 MG capsule Take 1 capsule (20 mg total) by mouth daily. Take one tablet daily 12/06/17   Carmin Muskrat, MD  sucralfate (CARAFATE) 1 g tablet Take 1 tablet (1 g  total) by mouth 4 (four) times daily -  with meals and at bedtime. 12/06/17   Carmin Muskrat, MD    Family History Family History  Problem Relation Age of Onset  . Heart disease Father   . Heart disease Mother   . Hypertension Mother   . Rectal cancer Mother        dx in her early 30s  . Colon polyps Mother   . Breast cancer Maternal Aunt        dx in her early 76s  . Heart disease Paternal Uncle   . Heart disease Paternal Grandmother   . Heart disease Paternal Grandfather   . Diabetes Brother   . Breast cancer  Maternal Grandmother        dx in her 26s  . Breast cancer Cousin        dx in early 20s  . Breast cancer Cousin 22  . Hepatitis C Son   . Hypertension Son   . Post-traumatic stress disorder Son     Social History Social History   Tobacco Use  . Smoking status: Never Smoker  . Smokeless tobacco: Never Used  Substance Use Topics  . Alcohol use: No    Alcohol/week: 0.0 standard drinks  . Drug use: No     Allergies   Acetaminophen; Amoxicillin-pot clavulanate; Aspirin; Azithromycin; Cefuroxime axetil; Cephalexin; Clonazepam; Hydrocodone-acetaminophen; Hydrocodone-acetaminophen; Levofloxacin; Milk-related compounds; Morphine and related; Ondansetron; Propoxyphene n-acetaminophen; Rosuvastatin; Sulfonamide derivatives; Tramadol; Trazodone and nefazodone; Vioxx [rofecoxib]; Zofran [ondansetron hcl]; Clarithromycin; Codeine; Seroquel [quetiapine fumarate]; and Sulfamethoxazole   Review of Systems Review of Systems  Constitutional: Negative for fever.  Cardiovascular: Negative for chest pain.  All other systems reviewed and are negative.    Physical Exam Updated Vital Signs BP (!) 170/79 (BP Location: Left Arm)   Pulse 93   Temp (!) 97.2 F (36.2 C) (Oral)   Resp 18   Ht 1.581 m (5' 2.25")   Wt 72.6 kg   LMP 01/10/2003   SpO2 96%   BMI 29.03 kg/m   Physical Exam CONSTITUTIONAL: Well developed/well nourished HEAD: Normocephalic/atraumatic EYES:  EOMI/PERRL ENMT: Mucous membranes moist NECK: supple no meningeal signs SPINE/BACK:entire spine nontender CV: S1/S2 noted, no murmurs/rubs/gallops noted LUNGS: Lungs are clear to auscultation bilaterally, no apparent distress ABDOMEN: soft, nontender NEURO: Pt is awake/alert/appropriate, moves all extremitiesx4.  No facial droop.   EXTREMITIES: pulses normal/equal, full ROM SKIN: warm, color normal PSYCH: no abnormalities of mood noted, alert and oriented to situation, patient maintains eye contact.   ED Treatments / Results  Labs (all labs ordered are listed, but only abnormal results are displayed) Labs Reviewed  COMPREHENSIVE METABOLIC PANEL - Abnormal; Notable for the following components:      Result Value   Glucose, Bld 125 (*)    Calcium 8.7 (*)    Total Bilirubin 0.2 (*)    GFR calc non Af Amer 58 (*)    All other components within normal limits  ETHANOL  CBC WITH DIFFERENTIAL/PLATELET  RAPID URINE DRUG SCREEN, HOSP PERFORMED    EKG None  Radiology No results found.  Procedures Procedures   Medications Ordered in ED Medications - No data to display   Initial Impression / Assessment and Plan / ED Course  I have reviewed the triage vital signs and the nursing notes.  Pertinent labs  results that were available during my care of the patient were reviewed by me and considered in my medical decision making (see chart for details).     12:56 AM Patient presents from home under involuntary commitment.  on my assessment she is in no acute distress.  She denies any issues at home.  She reports this is normal marital strife On review of chart, she had a similar ER visit back in September and was admitted at that time.  She was deemed appropriate for discharge at time.  Husband reports since that time she has been deteriorating.  Her aggression has become worse.  She is refused to be seen for this.  Her PCP has felt that she has dementia and referred her to neurology,  but she refused to see neurology.  He confirms that she has been physically aggressive at home as well  as destroying property at home. I suspect she is having worsening dementia with behavioral disturbances.  This does not appear to be an acute event tonight, therefore acute delirium is low.  She may require admission, may require geriatric psych admission.  Patient is medically stable at this time. IVC and first examination has been completed at this time until further instructions from psychiatry  Final Clinical Impressions(s) / ED Diagnoses   Final diagnoses:  Dementia with behavioral disturbance, unspecified dementia type [facility])    ED Discharge Orders    None       Ripley Fraise, MD 06/15/18 [phone]

## 2018-06-15 NOTE — ED Notes (Signed)
Pt meal tray given at this time.

## 2018-06-15 NOTE — ED Notes (Signed)
ED Provider at bedside. 

## 2018-06-15 NOTE — Progress Notes (Signed)
Pt. meets criteria for inpatient treatment per Neita Garnet, MD.  Referred out to the following hospitals:  [facility]  [facility]      Disposition CSW will continue to follow for placement.   Areatha Keas. Judi Cong, MSW, [place] Disposition Clinical Social Work [phone] (cell) [phone] (office)

## 2018-06-15 NOTE — [facility] Counselor (Addendum)
Counselor spoke with patient's husband, [name]. Kylie Tucker stated that patient began having symptoms of memory loss and changes in behavior 5 years ago. She has trouble with short term memory, is unable to drive, and is losing her vocabulary. She is cussing a lot and "tearing up the house." Sometimes she is awake until 6:00 in the morning. Patient was declined at [facility] memory care due to her behaviors. Patient was supposed to have an appointment at [facility] on 12/4 but refused to go, which escalated to the incident having her go to the ED. Patient does not have a history of mental illness or substance use.

## 2018-06-15 NOTE — ED Notes (Signed)
Pt given lunch meal tray given at this time.

## 2018-06-15 NOTE — ED Notes (Signed)
Pt given warm blanket.

## 2018-06-15 NOTE — ED Provider Notes (Signed)
EKG Interpretation  Date/Time:  Thursday June 15 2018 01:15:38 EST Ventricular Rate:  81 PR Interval:    QRS Duration: 156 QT Interval:  469 QTC Calculation: 545 R Axis:   -75 Text Interpretation:  Sinus rhythm Probable left atrial enlargement Left bundle branch block No significant change since last tracing Confirmed by Ripley Fraise [phone]) on 06/15/2018 1:35:39 AM         Ripley Fraise, MD 06/15/18 [phone]

## 2018-06-15 NOTE — BH Assessment (Addendum)
Tele Assessment Note   Patient Name: Kylie Tucker MRN: [id number] Referring Physician: Dr. Ripley Fraise, MD Location of Patient: [facility] Location of Provider: [place] is a 67 y.o. female who was brought into the [facility] ED by the [facility] due to her husband IVC-ing her because of her ongoing acting-out behaviors when she experiences dementia, including becoming PA towards her husband, destroying things within the home, and spitting out food/shouting in public. Pt's husband informed pt's EDP that pt's dementia has been increasing and that her behaviors have as well. He states that she refuses to acknowledge that anything is wrong and that she refuses to see a neuropsychologist in an attempt to try to make things better.  Pt denies SI, HI, AVH, and NSSIB. Pt denies any history of any of these behaviors, as well as denies any history of attempts of suicide, any prior hospitalizations due to [place], or any prior plans to harm oneself. She denies any prior SA, any involvement in the court system, and any access to weapons.  Pt states she is retired and that she and her husband live alone. She states she sleeps 6-7 hours per night and that her appetite is fine. She shares she shares she has never seen a therapist nor a psychiatrist and states she is capable of completing her ADLs.  Pt is oriented x4. Her recent and remote memory appears to be intact, though it is difficult to ascertain under the circumstances. Pt was pleasant throughout the assessment. Her insight, judgement, and impulse control is impaired at this time.  Pt gave verbal permission to converse with her husband if necessary. Pt's husband's name is Lorriane Shire and his number is [phone].  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 Allergy   . Anxiety   .  Bilateral ovarian cysts    Do yearly ultrasound.  . Cancer ([place]) rt breast   2004/ chemo/ radiation  . Cellulitis and abscess of upper arm and forearm   . Chronic combined systolic and diastolic CHF (congestive heart failure) ([place])   . Family history of breast cancer   . Family history of rectal cancer   . GERD (gastroesophageal reflux disease)   . H/O Helicobacter infection 2017  . History of right mastectomy 2004   chemo/ radiation  . Hyperlipidemia   . Hypertension   . Insomnia   . Left bundle branch block 2016  . Migraines   . Moderate mitral regurgitation   . NICM (nonischemic cardiomyopathy) ([place])   . PONV (postoperative nausea and vomiting)    cannot take zofran  . Postmastectomy lymphedema rt side  . Sentinel node     Past Surgical History:  Procedure Laterality Date  . BREAST EXCISIONAL BIOPSY Left    benign  . CARPAL TUNNEL RELEASE    . CATARACT EXTRACTION Left 07/12/2013  . MANDIBLE SURGERY  1991  . MASTECTOMY Right    malignant  . NASAL SINUS SURGERY  1990/2001  . ORIF FINGER FRACTURE  02/14/2012   Procedure: OPEN REDUCTION INTERNAL FIXATION (ORIF) METACARPAL (FINGER) FRACTURE;  Surgeon: Wynonia Sours, MD;  Location: [facility];  Service: Orthopedics;  Laterality: Right;  RIGHT FIFTH   . ORIF METACARPAL FRACTURE  8/13   rt   . PORT-A-CATH REMOVAL     in and now out  . REPAIR EXTENSOR TENDON  Left 05/30/2013   Procedure: RELEASE TRANSPOSITION EXTENSOR POLLICUS LONGUS LEFT WRIST;  Surgeon: Wynonia Sours, MD;  Location: [facility];  Service: Orthopedics;  Laterality: Left;  . rt mastectomy  2004   lymph nodes-7-axillary node dissection  . SHOULDER ARTHROSCOPY WITH ROTATOR CUFF REPAIR AND SUBACROMIAL DECOMPRESSION Left 11/22/2013   Procedure: LEFT SHOULDER ARTHROSCOPY WITH SUBACROMIAL DECOMPRESSION DISTAL CLAVICLE RESECTION REPAIR  ROTATOR CUFF AS NEEDED ;  Surgeon: Cammie Sickle., MD;  Location: [facility];  Service:  Orthopedics;  Laterality: Left;    Family History:  Family History  Problem Relation Age of Onset  . Heart disease Father   . Heart disease Mother   . Hypertension Mother   . Rectal cancer Mother        dx in her early 27s  . Colon polyps Mother   . Breast cancer Maternal Aunt        dx in her early 44s  . Heart disease Paternal Uncle   . Heart disease Paternal Grandmother   . Heart disease Paternal Grandfather   . Diabetes Brother   . Breast cancer Maternal Grandmother        dx in her 108s  . Breast cancer Cousin        dx in early 11s  . Breast cancer Cousin 57  . Hepatitis C Son   . Hypertension Son   . Post-traumatic stress disorder Son     Social History:  reports that she has never smoked. She has never used smokeless tobacco. She reports that she does not drink alcohol or use drugs.  Additional Social History:  Alcohol / Drug Use Pain Medications: Please see MAR Prescriptions: Please see MAR Over the Counter: Please see MAR History of alcohol / drug use?: No history of alcohol / drug abuse Longest period of sobriety (when/how long): N/A  CIWA: CIWA-Ar BP: (!) 170/79 Pulse Rate: 93 COWS:    Allergies:  Allergies  Allergen Reactions  . Acetaminophen Nausea Only    Other reaction(s): Other (See Comments)  . Amoxicillin-Pot Clavulanate Nausea Only    flushing  . Aspirin     Gastrointestinal upset  . Azithromycin     Heart palpitations   . Cefuroxime Axetil Nausea Only  . Cephalexin Nausea Only  . Clonazepam     Heart had a "weird feeling" and difficulty breathing  . Hydrocodone-Acetaminophen Hives  . Hydrocodone-Acetaminophen     Other reaction(s): GI Upset (intolerance)  . Levofloxacin Nausea Only  . Milk-Related Compounds Nausea And Vomiting  . Morphine And Related Other (See Comments)    Hallucinations  . Ondansetron Nausea And Vomiting  . Propoxyphene N-Acetaminophen Nausea And Vomiting  . Rosuvastatin     REACTION: myalgia  . Sulfonamide  Derivatives Itching  . Tramadol Nausea And Vomiting  . Trazodone And Nefazodone Nausea Only    Felt like "I was burning all over" -patient  . Vioxx [Rofecoxib] Nausea And Vomiting  . Zofran [Ondansetron Hcl] Nausea And Vomiting  . Clarithromycin Nausea Only and Anxiety  . Codeine Itching and Rash  . Seroquel [Quetiapine Fumarate] Palpitations    Heart racing   . Sulfamethoxazole Rash    Home Medications:  (Not in a hospital admission)  OB/GYN Status:  Patient's last menstrual period was 01/10/2003.  General Assessment Data Location of Assessment: [facility] ED TTS Assessment: In system Is this a Tele or Face-to-Face Assessment?: Tele Assessment Is this an Initial Assessment or a Re-assessment for this encounter?: Initial  Assessment Patient Accompanied by:: N/A Language Other than English: No Living Arrangements: Other (Comment)(Pt lives with her husband) What gender do you identify as?: Female Marital status: Married Pharmacist, community name: [place] Pregnancy Status: No Living Arrangements: Spouse/significant other Can pt return to current living arrangement?: Yes Admission Status: Involuntary Petitioner: Family member Is patient capable of signing voluntary admission?: Yes Referral Source: Self/Family/Friend Insurance type: Equities trader     Crisis Care Plan Living Arrangements: Spouse/significant other Legal Guardian: (N/A) Name of Psychiatrist: None Name of Therapist: None  Education Status Is patient currently in school?: No Is the patient employed, unemployed or receiving disability?: Receiving disability income(Retired)  Risk to self with the past 6 months Suicidal Ideation: No Has patient been a risk to self within the past 6 months prior to admission? : No Suicidal Intent: No Has patient had any suicidal intent within the past 6 months prior to admission? : No Is patient at risk for suicide?: No Suicidal Plan?: No Has patient had any suicidal plan within the past 6  months prior to admission? : No Access to Means: No What has been your use of drugs/alcohol within the last 12 months?: Pt denies Previous Attempts/Gestures: No How many times?: 0 Other Self Harm Risks: None noted, though pt has increasing dementia Triggers for Past Attempts: None known Intentional Self Injurious Behavior: None Family Suicide History: No Recent stressful life event(s): Conflict (Comment)(Pt has had increasing conflict with her husband) Persecutory voices/beliefs?: No Depression: No Depression Symptoms: Feeling angry/irritable Substance abuse history and/or treatment for substance abuse?: No Suicide prevention information given to non-admitted patients: Not applicable  Risk to Others within the past 6 months Homicidal Ideation: No Does patient have any lifetime risk of violence toward others beyond the six months prior to admission? : Yes (comment)(Pt has been violent/aggressive towards her husband) Thoughts of Harm to Others: No Current Homicidal Intent: No Current Homicidal Plan: No Access to Homicidal Means: No Identified Victim: Pt has been violent/aggressive towards her husband History of harm to others?: Yes Assessment of Violence: On admission Violent Behavior Description: Pt has been destructive in the home and aggressive towards her husband Does patient have access to weapons?: No(Pt denies) Criminal Charges Pending?: No Does patient have a court date: No Is patient on probation?: No  Psychosis Hallucinations: None noted Delusions: None noted  Mental Status Report Appearance/Hygiene: In scrubs Eye Contact: Good Motor Activity: Other (Comment)(Pt is sitting up in her hospital bed) Speech: Logical/coherent Level of Consciousness: Alert Mood: Pleasant Affect: Appropriate to circumstance Anxiety Level: Minimal Thought Processes: Coherent, Relevant Judgement: Impaired Orientation: Person, Place, Time, Situation Obsessive Compulsive  Thoughts/Behaviors: None  Cognitive Functioning Concentration: Normal Memory: Recent Intact, Remote Intact Is patient IDD: No Insight: Fair Impulse Control: Fair Appetite: Good Have you had any weight changes? : No Change Sleep: No Change Total Hours of Sleep: 7 Vegetative Symptoms: None  ADLScreening [facility] Assessment Services) Patient's cognitive ability adequate to safely complete daily activities?: Yes Patient able to express need for assistance with ADLs?: Yes Independently performs ADLs?: Yes (appropriate for developmental age)  Prior Inpatient Therapy Prior Inpatient Therapy: No  Prior Outpatient Therapy Prior Outpatient Therapy: No Does patient have an ACCT team?: No Does patient have Intensive In-House Services?  : No Does patient have [facility] services? : No Does patient have P4CC services?: No  ADL Screening (condition at time of admission) Patient's cognitive ability adequate to safely complete daily activities?: Yes Is the patient deaf or have difficulty hearing?: No Does the patient  have difficulty seeing, even when wearing glasses/contacts?: No Does the patient have difficulty concentrating, remembering, or making decisions?: Yes Patient able to express need for assistance with ADLs?: Yes Does the patient have difficulty dressing or bathing?: No Independently performs ADLs?: Yes (appropriate for developmental age) Does the patient have difficulty walking or climbing stairs?: No Weakness of Legs: None Weakness of Arms/Hands: None     Therapy Consults (therapy consults require a physician order) PT Evaluation Needed: No OT Evalulation Needed: No SLP Evaluation Needed: No Abuse/Neglect Assessment (Assessment to be complete while patient is alone) Abuse/Neglect Assessment Can Be Completed: Yes Physical Abuse: Denies Verbal Abuse: Denies Sexual Abuse: Denies Exploitation of patient/patient's resources: Denies Self-Neglect: Denies Values / Beliefs Cultural  Requests During Hospitalization: None Spiritual Requests During Hospitalization: None Consults Spiritual Care Consult Needed: No Social Work Consult Needed: No Regulatory affairs officer (For Healthcare) Does Patient Have a Medical Advance Directive?: No Would patient like information on creating a medical advance directive?: No - Patient declined       Disposition: Patriciaann Clan PA reviewed pt's chart and information and determined pt meets criteria for inpatient geriatric psych hospitalization. This information was provided to pt's nurse, Annabell Sabal, at [phone].  Disposition Initial Assessment Completed for this Encounter: Yes Patient referred to: Other (Comment)(Pt will be referred out to multiple geri psych units)  This service was provided via telemedicine using a 2-way, interactive audio and video technology.  Names of all persons participating in this telemedicine service and their role in this encounter. Name: Albie Bazin Role: Patient  Name: Windell Hummingbird Role: Clinician   Dannielle Burn 06/15/2018 5:31 AM

## 2018-06-15 NOTE — ED Notes (Signed)
Patient has completed TTS consult.

## 2018-06-15 NOTE — Progress Notes (Addendum)
Pt accepted to [facility] [address] Dr. Harland German, MD is the accepting/attending provider.  Call report to [phone] Ask for [address]  [email or web address] notified.   Pt is IVC  Pt may be transported by [facility] Pt scheduled  to arrive at [place] as soon as transport can be arranged.  Kylie Tucker. Judi Cong, MSW, [place] Disposition Clinical Social Work [phone] (cell) [phone] (office)  CSW called and spoke to patient's husband, Kylie Tucker and advised him of acceptance.

## 2018-09-05 DIAGNOSIS — E6609 Other obesity due to excess calories: Secondary | ICD-10-CM | POA: Diagnosis not present

## 2018-09-05 DIAGNOSIS — Z23 Encounter for immunization: Secondary | ICD-10-CM | POA: Diagnosis not present

## 2018-09-05 DIAGNOSIS — N83209 Unspecified ovarian cyst, unspecified side: Secondary | ICD-10-CM | POA: Diagnosis not present

## 2018-09-05 DIAGNOSIS — Z6832 Body mass index (BMI) 32.0-32.9, adult: Secondary | ICD-10-CM | POA: Diagnosis not present

## 2018-09-05 DIAGNOSIS — F419 Anxiety disorder, unspecified: Secondary | ICD-10-CM | POA: Diagnosis not present

## 2018-09-05 DIAGNOSIS — R109 Unspecified abdominal pain: Secondary | ICD-10-CM | POA: Diagnosis not present

## 2018-09-05 DIAGNOSIS — I10 Essential (primary) hypertension: Secondary | ICD-10-CM | POA: Diagnosis not present

## 2018-09-05 DIAGNOSIS — Z0001 Encounter for general adult medical examination with abnormal findings: Secondary | ICD-10-CM | POA: Diagnosis not present

## 2018-09-05 DIAGNOSIS — R102 Pelvic and perineal pain: Secondary | ICD-10-CM | POA: Diagnosis not present

## 2018-09-05 DIAGNOSIS — Z1389 Encounter for screening for other disorder: Secondary | ICD-10-CM | POA: Diagnosis not present

## 2018-09-05 DIAGNOSIS — C50119 Malignant neoplasm of central portion of unspecified female breast: Secondary | ICD-10-CM | POA: Diagnosis not present

## 2018-10-20 ENCOUNTER — Ambulatory Visit (INDEPENDENT_AMBULATORY_CARE_PROVIDER_SITE_OTHER): Payer: PPO

## 2018-10-20 ENCOUNTER — Other Ambulatory Visit: Payer: Self-pay

## 2018-10-20 ENCOUNTER — Ambulatory Visit (HOSPITAL_COMMUNITY)
Admission: EM | Admit: 2018-10-20 | Discharge: 2018-10-20 | Disposition: A | Discharge status: Home / Self Care | Payer: PPO

## 2018-10-20 ENCOUNTER — Encounter (HOSPITAL_COMMUNITY): Payer: Self-pay | Admitting: Emergency Medicine

## 2018-10-20 DIAGNOSIS — E1165 Type 2 diabetes mellitus with hyperglycemia: Secondary | ICD-10-CM | POA: Diagnosis not present

## 2018-10-20 DIAGNOSIS — E782 Mixed hyperlipidemia: Secondary | ICD-10-CM | POA: Diagnosis not present

## 2018-10-20 DIAGNOSIS — W19XXXA Unspecified fall, initial encounter: Secondary | ICD-10-CM

## 2018-10-20 DIAGNOSIS — S8991XA Unspecified injury of right lower leg, initial encounter: Secondary | ICD-10-CM

## 2018-10-20 DIAGNOSIS — S82031A Displaced transverse fracture of right patella, initial encounter for closed fracture: Secondary | ICD-10-CM | POA: Diagnosis not present

## 2018-10-20 DIAGNOSIS — S82001A Unspecified fracture of right patella, initial encounter for closed fracture: Secondary | ICD-10-CM | POA: Diagnosis not present

## 2018-10-20 DIAGNOSIS — I10 Essential (primary) hypertension: Secondary | ICD-10-CM | POA: Diagnosis not present

## 2018-10-20 MED ORDER — TRAMADOL HCL 50 MG PO TABS: 50.0000 mg | ORAL_TABLET | Freq: Four times a day (QID) | ORAL | 0 refills | Status: DC | PRN

## 2018-10-20 MED ORDER — METOCLOPRAMIDE HCL 5 MG PO TABS: 5.0000 mg | ORAL_TABLET | Freq: Three times a day (TID) | ORAL | 0 refills | Status: DC | PRN

## 2018-10-20 MED ORDER — PANTOPRAZOLE SODIUM 20 MG PO TBEC: 20.0000 mg | DELAYED_RELEASE_TABLET | Freq: Every day | ORAL | 0 refills | Status: DC

## 2018-10-20 NOTE — Discharge Instructions (Signed)
Keep immobilizer on at all times.  Elevate and ice the knee to help with pain and swelling.  Protonix daily.  Tramadol as needed for breakthrough pain, reglan as needed for nausea.  Crutches for a maximum of 50% weight bearing.  Call [facility] on Monday to make appointment with Dr. Stann Mainland on Wednesday. I have consulted with him in regards to your case.

## 2018-10-20 NOTE — ED Triage Notes (Signed)
Pt states she tripped and fell yesterday and landed on her R knee, states she Is unable to put weight on it.

## 2018-10-20 NOTE — Progress Notes (Signed)
I have discussed this case with the urgent care provider.  At this time I would recommend a knee immobilizer with 50% weightbearing allowed in that brace.  This will need operative management.  I am happy to see Kylie Tucker in the office next week to plan for that surgery.

## 2018-10-20 NOTE — ED Provider Notes (Signed)
[place]    CSN: [id number] Arrival date & time: 10/20/18  1342     History   Chief Complaint Chief Complaint  Patient presents with   Knee Injury    HPI Kylie Tucker is a 68 y.o. female.   [name] presents with complaints of right knee pain s/p fall last night. She states she tripped and fell onto the knee directly, onto her hardwood floor. Pain with any weight bearing and with knee extension. Swelling present. No numbness or tingling. Denies any previous knee injury. Hasn't taken any medications for symptoms. Has had shoulder surgery and carpal tunnel surgery with [facility]. Hx of anxiety, cancer, CHF, hgb, LBBB, hallucinations.    ROS per HPI, negative if not otherwise mentioned.      Past Medical History:  Diagnosis Date   Allergy    Anxiety    Bilateral ovarian cysts    Do yearly ultrasound.   Cancer ([place]) rt breast   2004/ chemo/ radiation   Cellulitis and abscess of upper arm and forearm    Chronic combined systolic and diastolic CHF (congestive heart failure) ([place])    Family history of breast cancer    Family history of rectal cancer    GERD (gastroesophageal reflux disease)    H/O Helicobacter infection 2017   History of right mastectomy 2004   chemo/ radiation   Hyperlipidemia    Hypertension    Insomnia    Left bundle branch block 2016   Migraines    Moderate mitral regurgitation    NICM (nonischemic cardiomyopathy) (HCC)    PONV (postoperative nausea and vomiting)    cannot take zofran   Postmastectomy lymphedema rt side   Sentinel node     Patient Active Problem List   Diagnosis Date Noted   Malignant neoplasm of lower-outer quadrant of right breast of female, estrogen receptor positive ([place]) 05/09/2018   Elevated troponin 04/06/2018   Hallucinations 04/06/2018   Agitation 04/06/2018   HTN (hypertension) 04/06/2018   Bilateral ovarian cysts 05/20/2017   Hyponatremia 11/28/2016     N&V (nausea and vomiting) 11/28/2016   Abdominal pain 11/28/2016   Sleep talking 07/22/2016   Genetic testing 06/01/2016   Family history of breast cancer    Family history of rectal cancer    Chest pain 09/23/2015   Abnormal vision 06/13/2015   Anxiety disorder 06/13/2015   Cataract cortical, senile 06/13/2015   Clinical depression 06/13/2015   Cephalalgia 06/13/2015   H/O malignant neoplasm of breast 06/13/2015   Ocular migraine 06/13/2015   Sinus infection 06/13/2015   Long term current use of systemic steroids 78/58/8502   Chronic systolic CHF (congestive heart failure), NYHA class 1 ([place]) 05/09/2015   LBBB (left bundle branch block) 03/26/2015   Lymphedema of arm 12/13/2013   Complete rupture of rotator cuff 11/22/2013   Nipple discharge in female 12/01/2012   History of breast cancer 11/30/2012   Epiphora 11/28/2012   Disorder of endocrine system 04/25/2012   Ceratitis 04/25/2012   Blepharitis 04/25/2012   Breast cancer, right breast ([place])    FATIGUE 10/01/2009   CAROTID BRUIT 10/01/2009   Dyspnea 10/01/2009   HYPERLIPIDEMIA 09/30/2009    Past Surgical History:  Procedure Laterality Date   BREAST EXCISIONAL BIOPSY Left    benign   CARPAL TUNNEL RELEASE     CATARACT EXTRACTION Left 07/12/2013   MANDIBLE SURGERY  1991   MASTECTOMY Right    malignant   NASAL SINUS SURGERY  1990/2001  ORIF FINGER FRACTURE  02/14/2012   Procedure: OPEN REDUCTION INTERNAL FIXATION (ORIF) METACARPAL (FINGER) FRACTURE;  Surgeon: Wynonia Sours, MD;  Location: [place];  Service: Orthopedics;  Laterality: Right;  RIGHT FIFTH    ORIF METACARPAL FRACTURE  8/13   rt    PORT-A-CATH REMOVAL     in and now out   [place] Left 05/30/2013   Procedure: RELEASE TRANSPOSITION EXTENSOR POLLICUS LONGUS LEFT WRIST;  Surgeon: Wynonia Sours, MD;  Location: [place];  Service: Orthopedics;  Laterality: Left;   rt  mastectomy  2004   lymph nodes-7-axillary node dissection   SHOULDER ARTHROSCOPY WITH ROTATOR CUFF REPAIR AND SUBACROMIAL DECOMPRESSION Left 11/22/2013   Procedure: LEFT SHOULDER ARTHROSCOPY WITH SUBACROMIAL DECOMPRESSION DISTAL CLAVICLE RESECTION REPAIR  ROTATOR CUFF AS NEEDED ;  Surgeon: Cammie Sickle., MD;  Location: [place];  Service: Orthopedics;  Laterality: Left;    OB History    Gravida  3   Para  2   Term      Preterm      AB  1   Living  2     SAB  0   TAB  1   Ectopic      Multiple      Live Births               Home Medications    Prior to Admission medications   Medication Sig Start Date End Date Taking? Authorizing Provider  furosemide (LASIX) 40 MG tablet Take 40 mg by mouth.   Yes [provider]  ALPRAZOLAM XR 3 MG 24 hr tablet Take 3 mg by mouth at bedtime. 06/30/16   [provider]  Garlic (GARLIQUE PO) Take 1 capsule by mouth daily.    [provider]  hydrocortisone 2.5 % cream Apply 1 application topically daily as needed (for skin irritation).  10/19/17   [provider]  metoCLOPramide (REGLAN) 5 MG tablet Take 1 tablet (5 mg total) by mouth every 8 (eight) hours as needed for nausea. 10/20/18   Zigmund Gottron, NP  metoprolol succinate (TOPROL-XL) 50 MG 24 hr tablet Take 1 tablet by mouth every evening.  08/23/16   [provider]  omeprazole (PRILOSEC) 20 MG capsule Take 1 capsule (20 mg total) by mouth daily. Take one tablet daily Patient not taking: Reported on 06/15/2018 12/06/17   Carmin Muskrat, MD  pantoprazole (PROTONIX) 20 MG tablet Take 1 tablet (20 mg total) by mouth daily. 10/20/18   Zigmund Gottron, NP  sucralfate (CARAFATE) 1 g tablet Take 1 tablet (1 g total) by mouth 4 (four) times daily -  with meals and at bedtime. Patient not taking: Reported on 06/15/2018 12/06/17   Carmin Muskrat, MD  traMADol (ULTRAM) 50 MG tablet Take 1 tablet (50 mg total) by mouth  every 6 (six) hours as needed. 10/20/18   Zigmund Gottron, NP  triazolam (HALCION) 0.25 MG tablet Take 1 tablet by mouth daily. 05/17/18   [provider]    Family History Family History  Problem Relation Age of Onset   Heart disease Father    Heart disease Mother    Hypertension Mother    Rectal cancer Mother        dx in her early 38s   Colon polyps Mother    Breast cancer Maternal Aunt        dx in her early 71s   Heart disease Paternal  Uncle    Heart disease Paternal Grandmother    Heart disease Paternal Grandfather    Diabetes Brother    Breast cancer Maternal Grandmother        dx in her 31s   Breast cancer Cousin        dx in early 23s   Breast cancer Cousin 41   Hepatitis C Son    Hypertension Son    Post-traumatic stress disorder Son     Social History Social History   Tobacco Use   Smoking status: Never Smoker   Smokeless tobacco: Never Used  Substance Use Topics   Alcohol use: No    Alcohol/week: 0.0 standard drinks   Drug use: No     Allergies   Acetaminophen; Amoxicillin-pot clavulanate; Aspirin; Azithromycin; Cefuroxime axetil; Cephalexin; Clonazepam; Hydrocodone-acetaminophen; Hydrocodone-acetaminophen; Levofloxacin; Milk-related compounds; Morphine and related; Ondansetron; Propoxyphene n-acetaminophen; Rosuvastatin; Sulfonamide derivatives; Tramadol; Trazodone and nefazodone; Vioxx [rofecoxib]; Zofran [ondansetron hcl]; Clarithromycin; Codeine; Seroquel [quetiapine fumarate]; and Sulfamethoxazole   Review of Systems Review of Systems   Physical Exam Triage Vital Signs ED Triage Vitals [10/20/18 1356]  Enc Vitals Group     BP (!) 162/80     Pulse Rate 79     Resp 18     Temp 99.3 F (37.4 C)     Temp src      SpO2 98 %     Weight      Height      Head Circumference      Peak Flow      Pain Score 10     Pain Loc      Pain Edu?      Excl. in [place]?    No data found.  Updated Vital Signs BP (!) 162/80     Pulse 79    Temp 99.3 F (37.4 C)    Resp 18    LMP 01/10/2003    SpO2 98%    Physical Exam Constitutional:      General: She is not in acute distress.    Appearance: She is well-developed.  Cardiovascular:     Rate and Rhythm: Normal rate and regular rhythm.     Heart sounds: Normal heart sounds.  Pulmonary:     Effort: Pulmonary effort is normal.     Breath sounds: Normal breath sounds.  Musculoskeletal:     Right knee: She exhibits decreased range of motion, swelling, effusion and bony tenderness.     Comments: Limited exam due to significant pain and swelling; comfortable at rest with knee in approximately 90 deg flexion; bony tenderness to patella   Skin:    General: Skin is warm and dry.  Neurological:     Mental Status: She is alert and oriented to person, place, and time.  Psychiatric:        Mood and Affect: Affect is flat.      UC Treatments / Results  Labs (all labs ordered are listed, but only abnormal results are displayed) Labs Reviewed - No data to display  EKG None  Radiology Dg Knee Complete 4 Views Right  Result Date: 10/20/2018 CLINICAL DATA:  Fall. Pain. EXAM: RIGHT KNEE - COMPLETE 4+ VIEW COMPARISON:  None. FINDINGS: There is a transverse, comminuted, and mildly displaced patellar fracture. This is best seen on the lateral radiograph. There is 5 mm distraction of the proximal from the comminuted distal segments. Large joint effusion. IMPRESSION: Transverse, comminuted, and mildly displaced patellar fracture. Large joint effusion. Electronically Signed   By:  Staci Righter M.D.   On: 10/20/2018 14:50    Procedures Procedures (including critical care time)  Medications Ordered in UC Medications - No data to display  Initial Impression / Assessment and Plan / UC Course  I have reviewed the triage vital signs and the nursing notes.  Pertinent labs & imaging results that were available during my care of the patient were reviewed by me and considered in  my medical decision making (see chart for details).     Page and return call from on call orthopedist Dr. Stann Mainland- immobilizer and follow up on Monday, to be seen on Wednesday. Crutches provided. Patient with extensive medication allergies. States she is allergic to tylenol. States tramadol and nsaids cause stomach upset. Per chart review morphine causes hallucinations, and patient with some behavioral history, but states she can tolerated hydrocodone. She also states zofran and reglan cause stomach upset. Feel that tramadol is safest options with least significant risk of hallucinations. May cause stomach upset, protonix and reglan provided. To follow up next week with ortho. Patient verbalized understanding and agreeable to plan.   Final Clinical Impressions(s) / UC Diagnoses   Final diagnoses:  Closed displaced transverse fracture of right patella, initial encounter     Discharge Instructions     Keep immobilizer on at all times.  Elevate and ice the knee to help with pain and swelling.  Protonix daily.  Tramadol as needed for breakthrough pain, reglan as needed for nausea.  Crutches for a maximum of 50% weight bearing.  Call [facility] on Monday to make appointment with Dr. Stann Mainland on Wednesday. I have consulted with him in regards to your case.      ED Prescriptions    Medication Sig Dispense Auth. Provider   pantoprazole (PROTONIX) 20 MG tablet Take 1 tablet (20 mg total) by mouth daily. 20 tablet Augusto Gamble B, NP   traMADol (ULTRAM) 50 MG tablet Take 1 tablet (50 mg total) by mouth every 6 (six) hours as needed. 15 tablet Augusto Gamble B, NP   metoCLOPramide (REGLAN) 5 MG tablet Take 1 tablet (5 mg total) by mouth every 8 (eight) hours as needed for nausea. 20 tablet Zigmund Gottron, NP     Controlled Substance Prescriptions [place] Controlled Substance Registry consulted? Not Applicable   Zigmund Gottron, NP 10/20/18 [phone]

## 2018-11-03 DIAGNOSIS — M1711 Unilateral primary osteoarthritis, right knee: Secondary | ICD-10-CM | POA: Diagnosis not present

## 2018-11-03 DIAGNOSIS — S82001A Unspecified fracture of right patella, initial encounter for closed fracture: Secondary | ICD-10-CM | POA: Diagnosis not present

## 2018-11-06 ENCOUNTER — Other Ambulatory Visit: Payer: Self-pay

## 2018-11-06 ENCOUNTER — Encounter (HOSPITAL_COMMUNITY): Payer: Self-pay | Admitting: *Deleted

## 2018-11-06 NOTE — Anesthesia Preprocedure Evaluation (Addendum)
Anesthesia Evaluation  Patient identified by MRN, date of birth, ID band Patient awake    Reviewed: Allergy & Precautions, NPO status , Patient's Chart, lab work & pertinent test results  History of Anesthesia Complications (+) PONV  Airway Mallampati: II  TM Distance: >3 FB     Dental   Pulmonary shortness of breath, pneumonia,    breath sounds clear to auscultation       Cardiovascular hypertension, +CHF  + dysrhythmias  Rhythm:Regular Rate:Normal     Neuro/Psych    GI/Hepatic Neg liver ROS, GERD  ,  Endo/Other  negative endocrine ROS  Renal/GU negative Renal ROS     Musculoskeletal   Abdominal   Peds  Hematology negative hematology ROS (+)   Anesthesia Other Findings   Reproductive/Obstetrics                            Anesthesia Physical Anesthesia Plan  ASA: III  Anesthesia Plan: General   Post-op Pain Management:  Regional for Post-op pain   Induction: Intravenous  PONV Risk Score and Plan: 4 or greater and Ondansetron, Dexamethasone and Midazolam  Airway Management Planned: Oral ETT  Additional Equipment:   Intra-op Plan:   Post-operative Plan:   Informed Consent: I have reviewed the patients History and Physical, chart, labs and discussed the procedure including the risks, benefits and alternatives for the proposed anesthesia with the patient or authorized representative who has indicated his/her understanding and acceptance.     Dental advisory given  Plan Discussed with: CRNA and Anesthesiologist  Anesthesia Plan Comments: (Review of notes in Epic suggests pt has had some deterioration in mental state over the past year. There is question of dementia. She had a behavioral health admission in 03/2018 for combativeness and confusion and an ED visit for similar 06/2018. Per cardiology notes she has a history of presumed nonischemic cardiomyopathy and combined HF. She  was seen by cardiology during her admission 03/2018 due to nonspecific elevated troponin with flat trend. Per Dr. Court Joy consult note, she had a normal nuclear stress test in September 2016.  Echocardiogram in 2016 showed mild to moderate left ventricular dysfunction, LVEF 40 to 60%, grade 2 diastolic dysfunction, and moderate mitral regurgitation.  Testing during 9/19 admission included chest x-ray with no active disease with stable mild to moderate cardiomegaly.  Head CT did not demonstrate any acute intracranial abnormalities.  ECG showed chronic LBBB. Echo showed EF 40-45%, mod MR, grade 1dd, similar to previous.  Per note, she did not require an ischemic workup and no further cardiac workup was planned.  )       Anesthesia Quick Evaluation

## 2018-11-06 NOTE — Progress Notes (Addendum)
Spoke with pt's husband Darryl (goes by [employer]) for pre-op call. DPR on file. Pt has dementia per Mr. Lahm. Pt has hx of left bundle branch block and CHF. Pt's cardiologist is Dr. Acie Fredrickson, last visit to him was May or June in 2019. He states pt has not complained of any chest pain or sob. Pt's PCP is Dr. Traci Sermon. Mr. Sassaman is pt's POA. I spoke with Particia Lather, RN, Asst. Director and she states pt's husband can be allowed to be with her for the few moments of the Pre-op time to answer questions and to sign consent form. I have called Mr. Graf back and told him this and also instructed him to bring the POA forms so we can scan them into pt's chart. He voiced understanding.

## 2018-11-07 ENCOUNTER — Observation Stay (HOSPITAL_COMMUNITY)
Admission: RE | Admit: 2018-11-07 | Discharge: 2018-11-08 | Disposition: A | Discharge status: Home / Self Care | Payer: PPO | Attending: Orthopedic Surgery | Admitting: Orthopedic Surgery

## 2018-11-07 ENCOUNTER — Encounter (HOSPITAL_COMMUNITY): Payer: Self-pay

## 2018-11-07 ENCOUNTER — Other Ambulatory Visit: Payer: Self-pay

## 2018-11-07 ENCOUNTER — Ambulatory Visit (HOSPITAL_COMMUNITY): Payer: PPO | Admitting: Physician Assistant

## 2018-11-07 ENCOUNTER — Encounter (HOSPITAL_COMMUNITY)
Admission: RE | Disposition: A | Discharge status: Home / Self Care | Payer: Self-pay | Source: Home / Self Care | Attending: Orthopedic Surgery

## 2018-11-07 ENCOUNTER — Ambulatory Visit (HOSPITAL_COMMUNITY): Payer: PPO

## 2018-11-07 DIAGNOSIS — N83201 Unspecified ovarian cyst, right side: Secondary | ICD-10-CM | POA: Diagnosis not present

## 2018-11-07 DIAGNOSIS — I5042 Chronic combined systolic (congestive) and diastolic (congestive) heart failure: Secondary | ICD-10-CM | POA: Diagnosis not present

## 2018-11-07 DIAGNOSIS — F0390 Unspecified dementia without behavioral disturbance: Secondary | ICD-10-CM | POA: Insufficient documentation

## 2018-11-07 DIAGNOSIS — I447 Left bundle-branch block, unspecified: Secondary | ICD-10-CM | POA: Insufficient documentation

## 2018-11-07 DIAGNOSIS — Z853 Personal history of malignant neoplasm of breast: Secondary | ICD-10-CM | POA: Insufficient documentation

## 2018-11-07 DIAGNOSIS — I340 Nonrheumatic mitral (valve) insufficiency: Secondary | ICD-10-CM | POA: Insufficient documentation

## 2018-11-07 DIAGNOSIS — Z882 Allergy status to sulfonamides status: Secondary | ICD-10-CM | POA: Insufficient documentation

## 2018-11-07 DIAGNOSIS — F419 Anxiety disorder, unspecified: Secondary | ICD-10-CM | POA: Diagnosis not present

## 2018-11-07 DIAGNOSIS — Z885 Allergy status to narcotic agent status: Secondary | ICD-10-CM | POA: Diagnosis not present

## 2018-11-07 DIAGNOSIS — S82031A Displaced transverse fracture of right patella, initial encounter for closed fracture: Secondary | ICD-10-CM | POA: Diagnosis not present

## 2018-11-07 DIAGNOSIS — Z923 Personal history of irradiation: Secondary | ICD-10-CM | POA: Insufficient documentation

## 2018-11-07 DIAGNOSIS — G43909 Migraine, unspecified, not intractable, without status migrainosus: Secondary | ICD-10-CM | POA: Insufficient documentation

## 2018-11-07 DIAGNOSIS — Z91011 Allergy to milk products: Secondary | ICD-10-CM | POA: Insufficient documentation

## 2018-11-07 DIAGNOSIS — Z9842 Cataract extraction status, left eye: Secondary | ICD-10-CM | POA: Diagnosis not present

## 2018-11-07 DIAGNOSIS — W19XXXA Unspecified fall, initial encounter: Secondary | ICD-10-CM | POA: Insufficient documentation

## 2018-11-07 DIAGNOSIS — Z79899 Other long term (current) drug therapy: Secondary | ICD-10-CM | POA: Diagnosis not present

## 2018-11-07 DIAGNOSIS — Z8051 Family history of malignant neoplasm of kidney: Secondary | ICD-10-CM | POA: Insufficient documentation

## 2018-11-07 DIAGNOSIS — Z881 Allergy status to other antibiotic agents status: Secondary | ICD-10-CM | POA: Diagnosis not present

## 2018-11-07 DIAGNOSIS — Z8379 Family history of other diseases of the digestive system: Secondary | ICD-10-CM | POA: Insufficient documentation

## 2018-11-07 DIAGNOSIS — Z8249 Family history of ischemic heart disease and other diseases of the circulatory system: Secondary | ICD-10-CM | POA: Insufficient documentation

## 2018-11-07 DIAGNOSIS — S82001A Unspecified fracture of right patella, initial encounter for closed fracture: Secondary | ICD-10-CM | POA: Diagnosis present

## 2018-11-07 DIAGNOSIS — Z803 Family history of malignant neoplasm of breast: Secondary | ICD-10-CM | POA: Insufficient documentation

## 2018-11-07 DIAGNOSIS — E785 Hyperlipidemia, unspecified: Secondary | ICD-10-CM | POA: Diagnosis not present

## 2018-11-07 DIAGNOSIS — N83202 Unspecified ovarian cyst, left side: Secondary | ICD-10-CM | POA: Insufficient documentation

## 2018-11-07 DIAGNOSIS — Z880 Allergy status to penicillin: Secondary | ICD-10-CM | POA: Insufficient documentation

## 2018-11-07 DIAGNOSIS — Z9221 Personal history of antineoplastic chemotherapy: Secondary | ICD-10-CM | POA: Insufficient documentation

## 2018-11-07 DIAGNOSIS — G4700 Insomnia, unspecified: Secondary | ICD-10-CM | POA: Diagnosis not present

## 2018-11-07 DIAGNOSIS — Z419 Encounter for procedure for purposes other than remedying health state, unspecified: Secondary | ICD-10-CM

## 2018-11-07 DIAGNOSIS — I5022 Chronic systolic (congestive) heart failure: Secondary | ICD-10-CM | POA: Diagnosis not present

## 2018-11-07 DIAGNOSIS — S82041A Displaced comminuted fracture of right patella, initial encounter for closed fracture: Secondary | ICD-10-CM | POA: Diagnosis not present

## 2018-11-07 DIAGNOSIS — Z833 Family history of diabetes mellitus: Secondary | ICD-10-CM | POA: Insufficient documentation

## 2018-11-07 DIAGNOSIS — I428 Other cardiomyopathies: Secondary | ICD-10-CM | POA: Insufficient documentation

## 2018-11-07 DIAGNOSIS — Z888 Allergy status to other drugs, medicaments and biological substances status: Secondary | ICD-10-CM | POA: Insufficient documentation

## 2018-11-07 DIAGNOSIS — Z886 Allergy status to analgesic agent status: Secondary | ICD-10-CM | POA: Insufficient documentation

## 2018-11-07 DIAGNOSIS — Z791 Long term (current) use of non-steroidal anti-inflammatories (NSAID): Secondary | ICD-10-CM | POA: Diagnosis not present

## 2018-11-07 DIAGNOSIS — Z818 Family history of other mental and behavioral disorders: Secondary | ICD-10-CM | POA: Insufficient documentation

## 2018-11-07 DIAGNOSIS — I110 Hypertensive heart disease with heart failure: Secondary | ICD-10-CM | POA: Diagnosis not present

## 2018-11-07 HISTORY — PX: ORIF PATELLA: SHX5033

## 2018-11-07 HISTORY — DX: Unspecified dementia, unspecified severity, without behavioral disturbance, psychotic disturbance, mood disturbance, and anxiety: F03.90

## 2018-11-07 HISTORY — PX: ORIF PATELLA FRACTURE: SUR947

## 2018-11-07 HISTORY — DX: Pneumonia, unspecified organism: J18.9

## 2018-11-07 LAB — CBC
HCT: 41.6 % (ref 36.0–46.0)
Hemoglobin: 13.3 g/dL (ref 12.0–15.0)
MCH: 29.2 pg (ref 26.0–34.0)
MCHC: 32 g/dL (ref 30.0–36.0)
MCV: 91.4 fL (ref 80.0–100.0)
Platelets: 361 10*3/uL (ref 150–400)
RBC: 4.55 MIL/uL (ref 3.87–5.11)
RDW: 14.3 % (ref 11.5–15.5)
WBC: 7.7 10*3/uL (ref 4.0–10.5)
nRBC: 0 % (ref 0.0–0.2)

## 2018-11-07 LAB — BASIC METABOLIC PANEL
Anion gap: 9 (ref 5–15)
BUN: 17 mg/dL (ref 8–23)
CO2: 25 mmol/L (ref 22–32)
Calcium: 9 mg/dL (ref 8.9–10.3)
Chloride: 105 mmol/L (ref 98–111)
Creatinine, Ser: 1.02 mg/dL — ABNORMAL HIGH (ref 0.44–1.00)
GFR calc Af Amer: >60 mL/min (ref >60)
GFR calc non Af Amer: 57 mL/min — ABNORMAL LOW (ref >60)
Glucose, Bld: 109 mg/dL — ABNORMAL HIGH (ref 70–99)
Potassium: 4 mmol/L (ref 3.5–5.1)
Sodium: 139 mmol/L (ref 135–145)

## 2018-11-07 SURGERY — OPEN REDUCTION INTERNAL FIXATION (ORIF) PATELLA
Anesthesia: General | Site: Knee | Laterality: Right

## 2018-11-07 MED ORDER — ALPRAZOLAM ER 1 MG PO TB24: 3.0000 mg | ORAL_TABLET | Freq: Every day | ORAL | Status: DC

## 2018-11-07 MED ORDER — TRIAZOLAM 0.125 MG PO TABS
0.1250 mg | ORAL_TABLET | Freq: Every day | ORAL | Status: DC
  Filled 2018-11-07: qty 1

## 2018-11-07 MED ORDER — ONDANSETRON HCL 4 MG/2ML IJ SOLN: 4.0000 mg | Freq: Four times a day (QID) | INTRAMUSCULAR | Status: DC | PRN

## 2018-11-07 MED ORDER — METOPROLOL SUCCINATE ER 25 MG PO TB24
50.0000 mg | ORAL_TABLET | Freq: Every day | ORAL | Status: DC
  Administered 2018-11-08: 50 mg via ORAL
  Filled 2018-11-07 (×2): qty 2

## 2018-11-07 MED ORDER — HYDROCODONE-ACETAMINOPHEN 5-325 MG PO TABS
1.0000 | ORAL_TABLET | ORAL | Status: DC | PRN
  Administered 2018-11-08 (×2): 2 via ORAL
  Filled 2018-11-07 (×2): qty 2

## 2018-11-07 MED ORDER — MIDAZOLAM HCL 2 MG/2ML IJ SOLN
2.0000 mg | Freq: Once | INTRAMUSCULAR | Status: AC
  Administered 2018-11-07: 2 mg via INTRAVENOUS

## 2018-11-07 MED ORDER — DEXAMETHASONE SODIUM PHOSPHATE 10 MG/ML IJ SOLN
INTRAMUSCULAR | Status: DC | PRN
  Administered 2018-11-07: 10 mg via INTRAVENOUS

## 2018-11-07 MED ORDER — PHENYLEPHRINE 40 MCG/ML (10ML) SYRINGE FOR IV PUSH (FOR BLOOD PRESSURE SUPPORT)
PREFILLED_SYRINGE | INTRAVENOUS | Status: AC
  Filled 2018-11-07: qty 10

## 2018-11-07 MED ORDER — 0.9 % SODIUM CHLORIDE (POUR BTL) OPTIME
TOPICAL | Status: DC | PRN
  Administered 2018-11-07: 1000 mL

## 2018-11-07 MED ORDER — DEXAMETHASONE SODIUM PHOSPHATE 10 MG/ML IJ SOLN
INTRAMUSCULAR | Status: AC
  Filled 2018-11-07: qty 1

## 2018-11-07 MED ORDER — ONDANSETRON HCL 4 MG PO TABS
4.0000 mg | ORAL_TABLET | Freq: Four times a day (QID) | ORAL | Status: DC | PRN
  Filled 2018-11-07: qty 1

## 2018-11-07 MED ORDER — BUPIVACAINE HCL (PF) 0.25 % IJ SOLN
INTRAMUSCULAR | Status: AC
  Filled 2018-11-07: qty 30

## 2018-11-07 MED ORDER — SUGAMMADEX SODIUM 200 MG/2ML IV SOLN
INTRAVENOUS | Status: DC | PRN
  Administered 2018-11-07: 200 mg via INTRAVENOUS

## 2018-11-07 MED ORDER — MIDAZOLAM HCL 2 MG/2ML IJ SOLN
INTRAMUSCULAR | Status: AC
  Administered 2018-11-07: 2 mg via INTRAVENOUS
  Filled 2018-11-07: qty 2

## 2018-11-07 MED ORDER — CHLORHEXIDINE GLUCONATE 4 % EX LIQD
60.0000 mL | Freq: Once | CUTANEOUS | Status: DC
  Administered 2018-11-07: 4 via TOPICAL

## 2018-11-07 MED ORDER — LIDOCAINE 2% (20 MG/ML) 5 ML SYRINGE
INTRAMUSCULAR | Status: AC
  Filled 2018-11-07: qty 5

## 2018-11-07 MED ORDER — MORPHINE SULFATE (PF) 2 MG/ML IV SOLN: 0.5000 mg | INTRAVENOUS | Status: DC | PRN

## 2018-11-07 MED ORDER — SODIUM CHLORIDE 0.9 % IV SOLN
INTRAVENOUS | Status: DC | PRN
  Administered 2018-11-07: 10:00:00 25 ug/min via INTRAVENOUS

## 2018-11-07 MED ORDER — BUPIVACAINE HCL (PF) 0.25 % IJ SOLN
INTRAMUSCULAR | Status: DC | PRN
  Administered 2018-11-07: 20 mL

## 2018-11-07 MED ORDER — FENTANYL CITRATE (PF) 100 MCG/2ML IJ SOLN
INTRAMUSCULAR | Status: AC
  Administered 2018-11-07: 50 ug via INTRAVENOUS
  Filled 2018-11-07: qty 2

## 2018-11-07 MED ORDER — PROPOFOL 10 MG/ML IV BOLUS
INTRAVENOUS | Status: DC | PRN
  Administered 2018-11-07: 150 mg via INTRAVENOUS

## 2018-11-07 MED ORDER — LACTATED RINGERS IV SOLN
INTRAVENOUS | Status: DC
  Administered 2018-11-07: 10:00:00 via INTRAVENOUS

## 2018-11-07 MED ORDER — HYDROCODONE-ACETAMINOPHEN 7.5-325 MG PO TABS
1.0000 | ORAL_TABLET | ORAL | Status: DC | PRN
  Administered 2018-11-07: 2 via ORAL
  Filled 2018-11-07: qty 2

## 2018-11-07 MED ORDER — FENTANYL CITRATE (PF) 100 MCG/2ML IJ SOLN: 25.0000 ug | INTRAMUSCULAR | Status: DC | PRN

## 2018-11-07 MED ORDER — FENTANYL CITRATE (PF) 100 MCG/2ML IJ SOLN
50.0000 ug | Freq: Once | INTRAMUSCULAR | Status: AC
  Administered 2018-11-07: 10:00:00 50 ug via INTRAVENOUS

## 2018-11-07 MED ORDER — CEFAZOLIN SODIUM-DEXTROSE 2-4 GM/100ML-% IV SOLN
2.0000 g | INTRAVENOUS | Status: AC
  Administered 2018-11-07: 2 g via INTRAVENOUS
  Filled 2018-11-07: qty 100

## 2018-11-07 MED ORDER — ACETAMINOPHEN 10 MG/ML IV SOLN
INTRAVENOUS | Status: DC | PRN
  Administered 2018-11-07: 1000 mg via INTRAVENOUS

## 2018-11-07 MED ORDER — FENTANYL CITRATE (PF) 100 MCG/2ML IJ SOLN
25.0000 ug | INTRAMUSCULAR | Status: DC | PRN
  Administered 2018-11-07 (×2): 50 ug via INTRAVENOUS

## 2018-11-07 MED ORDER — SUCCINYLCHOLINE CHLORIDE 200 MG/10ML IV SOSY
PREFILLED_SYRINGE | INTRAVENOUS | Status: AC
  Filled 2018-11-07: qty 10

## 2018-11-07 MED ORDER — FENTANYL CITRATE (PF) 250 MCG/5ML IJ SOLN
INTRAMUSCULAR | Status: AC
  Filled 2018-11-07: qty 5

## 2018-11-07 MED ORDER — LOPERAMIDE HCL 1 MG/5ML PO LIQD: 2.0000 mg | ORAL | Status: DC | PRN

## 2018-11-07 MED ORDER — ROCURONIUM BROMIDE 50 MG/5ML IV SOSY
PREFILLED_SYRINGE | INTRAVENOUS | Status: AC
  Filled 2018-11-07: qty 5

## 2018-11-07 MED ORDER — EPHEDRINE SULFATE-NACL 50-0.9 MG/10ML-% IV SOSY
PREFILLED_SYRINGE | INTRAVENOUS | Status: DC | PRN
  Administered 2018-11-07: 10 mg via INTRAVENOUS

## 2018-11-07 MED ORDER — FENTANYL CITRATE (PF) 100 MCG/2ML IJ SOLN
INTRAMUSCULAR | Status: AC
  Filled 2018-11-07: qty 2

## 2018-11-07 MED ORDER — BIOTIN 2.5 MG PO TABS: ORAL_TABLET | Freq: Every day | ORAL | Status: DC

## 2018-11-07 MED ORDER — ACETAMINOPHEN 10 MG/ML IV SOLN
INTRAVENOUS | Status: AC
  Filled 2018-11-07: qty 100

## 2018-11-07 MED ORDER — ALPRAZOLAM 0.5 MG PO TABS
2.0000 mg | ORAL_TABLET | Freq: Every day | ORAL | Status: DC
  Administered 2018-11-07: 21:00:00 2 mg via ORAL
  Filled 2018-11-07: qty 4

## 2018-11-07 MED ORDER — FENTANYL CITRATE (PF) 250 MCG/5ML IJ SOLN
INTRAMUSCULAR | Status: DC | PRN
  Administered 2018-11-07: 100 ug via INTRAVENOUS

## 2018-11-07 MED ORDER — SUCCINYLCHOLINE CHLORIDE 200 MG/10ML IV SOSY
PREFILLED_SYRINGE | INTRAVENOUS | Status: DC | PRN
  Administered 2018-11-07: 100 mg via INTRAVENOUS

## 2018-11-07 MED ORDER — ALPRAZOLAM 0.5 MG PO TABS
1.0000 mg | ORAL_TABLET | Freq: Every evening | ORAL | Status: DC | PRN
  Administered 2018-11-07: 21:00:00 1 mg via ORAL
  Filled 2018-11-07: qty 2

## 2018-11-07 MED ORDER — PROPOFOL 500 MG/50ML IV EMUL
INTRAVENOUS | Status: DC | PRN
  Administered 2018-11-07: 20 ug/kg/min via INTRAVENOUS

## 2018-11-07 MED ORDER — ACETAMINOPHEN 325 MG PO TABS: 325.0000 mg | ORAL_TABLET | Freq: Four times a day (QID) | ORAL | Status: DC | PRN

## 2018-11-07 MED ORDER — FUROSEMIDE 20 MG PO TABS: 20.0000 mg | ORAL_TABLET | Freq: Every day | ORAL | Status: DC | PRN

## 2018-11-07 MED ORDER — METOCLOPRAMIDE HCL 5 MG/ML IJ SOLN: 10.0000 mg | Freq: Once | INTRAMUSCULAR | Status: DC | PRN

## 2018-11-07 MED ORDER — ROCURONIUM BROMIDE 50 MG/5ML IV SOSY
PREFILLED_SYRINGE | INTRAVENOUS | Status: DC | PRN
  Administered 2018-11-07: 40 mg via INTRAVENOUS

## 2018-11-07 MED ORDER — TRAMADOL HCL 50 MG PO TABS
50.0000 mg | ORAL_TABLET | Freq: Four times a day (QID) | ORAL | Status: DC
  Filled 2018-11-07: qty 1

## 2018-11-07 SURGICAL SUPPLY — 71 items
ALCOHOL 70% 16 OZ (MISCELLANEOUS) ×3 IMPLANT
BANDAGE ACE 4X5 VEL STRL LF (GAUZE/BANDAGES/DRESSINGS) ×2 IMPLANT
BANDAGE ACE 6X5 VEL STRL LF (GAUZE/BANDAGES/DRESSINGS) IMPLANT
BIT DRILL 2.6 CANN (BIT) ×2 IMPLANT
BNDG COHESIVE 4X5 TAN STRL (GAUZE/BANDAGES/DRESSINGS) IMPLANT
BNDG COHESIVE 6X5 TAN STRL LF (GAUZE/BANDAGES/DRESSINGS) ×3 IMPLANT
BNDG GAUZE ELAST 4 BULKY (GAUZE/BANDAGES/DRESSINGS) ×2 IMPLANT
COVER SURGICAL LIGHT HANDLE (MISCELLANEOUS) ×3 IMPLANT
COVER WAND RF STERILE (DRAPES) ×1 IMPLANT
CUFF TOURNIQUET SINGLE 34IN LL (TOURNIQUET CUFF) ×2 IMPLANT
DRAPE C-ARM 42X72 X-RAY (DRAPES) IMPLANT
DRAPE C-ARMOR (DRAPES) IMPLANT
DRAPE IMP U-DRAPE 54X76 (DRAPES) ×3 IMPLANT
DRAPE INCISE IOBAN 66X45 STRL (DRAPES) ×3 IMPLANT
DRAPE U-SHAPE 47X51 STRL (DRAPES) IMPLANT
DRSG ADAPTIC 3X8 NADH LF (GAUZE/BANDAGES/DRESSINGS) ×2 IMPLANT
DURAPREP 26ML APPLICATOR (WOUND CARE) ×3 IMPLANT
ELECT CAUTERY BLADE 6.4 (BLADE) ×3 IMPLANT
ELECT REM PT RETURN 9FT ADLT (ELECTROSURGICAL) ×3
ELECTRODE REM PT RTRN 9FT ADLT (ELECTROSURGICAL) ×1 IMPLANT
FIBERTAPE 2 W/STRL NDL 17 (SUTURE) ×2 IMPLANT
GAUZE SPONGE 4X4 12PLY STRL (GAUZE/BANDAGES/DRESSINGS) ×3 IMPLANT
GAUZE XEROFORM 5X9 LF (GAUZE/BANDAGES/DRESSINGS) IMPLANT
GLOVE BIO SURGEON STRL SZ7.5 (GLOVE) ×3 IMPLANT
GLOVE BIOGEL PI IND STRL 8 (GLOVE) ×2 IMPLANT
GLOVE BIOGEL PI INDICATOR 8 (GLOVE) ×4
GOWN STRL REUS W/ TWL LRG LVL3 (GOWN DISPOSABLE) ×1 IMPLANT
GOWN STRL REUS W/ TWL XL LVL3 (GOWN DISPOSABLE) ×1 IMPLANT
GOWN STRL REUS W/TWL LRG LVL3 (GOWN DISPOSABLE) ×3
GOWN STRL REUS W/TWL XL LVL3 (GOWN DISPOSABLE) ×3
GUIDEWIRE 1.35MM  DUAL TROCAR (WIRE) ×4
GUIDEWIRE 1.35MM DUAL TROCAR (WIRE) IMPLANT
IMMOBILIZER KNEE 22 UNIV (SOFTGOODS) ×2 IMPLANT
KIT BASIN OR (CUSTOM PROCEDURE TRAY) ×3 IMPLANT
KIT TURNOVER KIT B (KITS) ×3 IMPLANT
NDL HYPO 25GX1X1/2 BEV (NEEDLE) IMPLANT
NEEDLE HYPO 25GX1X1/2 BEV (NEEDLE) IMPLANT
NS IRRIG 1000ML POUR BTL (IV SOLUTION) ×3 IMPLANT
PACK ORTHO EXTREMITY (CUSTOM PROCEDURE TRAY) ×3 IMPLANT
PAD ARMBOARD 7.5X6 YLW CONV (MISCELLANEOUS) ×6 IMPLANT
PAD CAST 3X4 CTTN HI CHSV (CAST SUPPLIES) IMPLANT
PAD CAST 4YDX4 CTTN HI CHSV (CAST SUPPLIES) IMPLANT
PADDING CAST COTTON 3X4 STRL (CAST SUPPLIES)
PADDING CAST COTTON 4X4 STRL (CAST SUPPLIES) ×3
PADDING CAST COTTON 6X4 STRL (CAST SUPPLIES) IMPLANT
PASSER SUT SWANSON 36MM LOOP (INSTRUMENTS) IMPLANT
SCREW 4.0X30MM (Screw) ×6 IMPLANT
SCREW BN 30X4XCANN BLNT TIP (Screw) IMPLANT
SPONGE LAP 18X18 RF (DISPOSABLE) ×3 IMPLANT
STOCKINETTE IMPERVIOUS LG (DRAPES) ×3 IMPLANT
SUCTION FRAZIER HANDLE 10FR (MISCELLANEOUS) ×2
SUCTION TUBE FRAZIER 10FR DISP (MISCELLANEOUS) ×1 IMPLANT
SUT ETHIBOND 5 LR DA (SUTURE) ×9 IMPLANT
SUT ETHILON 2 0 FS 18 (SUTURE) IMPLANT
SUT ETHILON 3 0 PS 1 (SUTURE) IMPLANT
SUT FIBERWIRE #2 38 T-5 BLUE (SUTURE) ×3
SUT MON AB 2-0 CT1 36 (SUTURE) ×2 IMPLANT
SUT VIC AB 0 CT1 27 (SUTURE)
SUT VIC AB 0 CT1 27XBRD ANBCTR (SUTURE) IMPLANT
SUT VIC AB 2-0 CT1 27 (SUTURE)
SUT VIC AB 2-0 CT1 TAPERPNT 27 (SUTURE) IMPLANT
SUTURE FIBERWR #2 38 T-5 BLUE (SUTURE) IMPLANT
SYR CONTROL 10ML LL (SYRINGE) IMPLANT
TOWEL OR 17X24 6PK STRL BLUE (TOWEL DISPOSABLE) ×3 IMPLANT
TOWEL OR 17X26 10 PK STRL BLUE (TOWEL DISPOSABLE) ×6 IMPLANT
TUBE CONNECTING 12'X1/4 (SUCTIONS) ×1
TUBE CONNECTING 12X1/4 (SUCTIONS) ×2 IMPLANT
UNDERPAD 30X30 (UNDERPADS AND DIAPERS) ×3 IMPLANT
WASHER (Orthopedic Implant) ×6 IMPLANT
WASHER ORTHO 7X (Orthopedic Implant) IMPLANT
WATER STERILE IRR 1000ML POUR (IV SOLUTION) ×3 IMPLANT

## 2018-11-07 NOTE — Transfer of Care (Signed)
Immediate Anesthesia Transfer of Care Note  Patient: Kylie Tucker  Procedure(s) Performed: OPEN REDUCTION INTERNAL (ORIF) FIXATION PATELLA (Right Knee)  Patient Location: PACU  Anesthesia Type:GA combined with regional for post-op pain  Level of Consciousness: awake, drowsy and patient cooperative  Airway & Oxygen Therapy: Patient Spontanous Breathing and Patient connected to face mask oxygen  Post-op Assessment: Report given to RN and Post -op Vital signs reviewed and stable  Post vital signs: Reviewed and stable  Last Vitals:  Vitals Value Taken Time  BP 126/56 11/07/2018 11:38 AM  Temp    Pulse 74 11/07/2018 11:39 AM  Resp 16 11/07/2018 11:39 AM  SpO2 98 % 11/07/2018 11:39 AM  Vitals shown include unvalidated device data.  Last Pain:  Vitals:   11/07/18 0759  PainSc: 9       Patients Stated Pain Goal: 3 (39/58/44 1712)  Complications: No apparent anesthesia complications

## 2018-11-07 NOTE — H&P (Signed)
ORTHOPAEDIC H and P   REQUESTING PHYSICIAN: Nicholes Stairs, MD  PCP:  Redmond School, MD  Chief Complaint: Right patella fracture  HPI: Kylie Tucker is a 68 y.o. female who complains of right knee pain following a fall on April 10.  She presented to the [facility] urgent care where this diagnosis was made.  She does have baseline dementia but is able to provide some history.  The other history is obtained via conversation with her husband.  I saw her in the office last week where we discussed moving forward with operative management given the loss of her extensor mechanism.  She is here today for that surgery with no new complaints.  Past Medical History:  Diagnosis Date  . Allergy   . Anxiety   . Bilateral ovarian cysts    Do yearly ultrasound.  . Cancer ([place]) rt breast   2004/ chemo/ radiation  . Cellulitis and abscess of upper arm and forearm   . Chronic combined systolic and diastolic CHF (congestive heart failure) ([place])   . Dementia ([place])   . Family history of breast cancer   . Family history of rectal cancer   . GERD (gastroesophageal reflux disease)   . H/O Helicobacter infection 2017  . History of right mastectomy 2004   chemo/ radiation  . Hyperlipidemia   . Hypertension   . Insomnia   . Left bundle branch block 2016  . Migraines   . Moderate mitral regurgitation   . NICM (nonischemic cardiomyopathy) ([place])   . Pneumonia   . PONV (postoperative nausea and vomiting)    cannot take zofran  . Postmastectomy lymphedema rt side  . Sentinel node    Past Surgical History:  Procedure Laterality Date  . BREAST EXCISIONAL BIOPSY Left    benign  . CARPAL TUNNEL RELEASE    . CATARACT EXTRACTION Left 07/12/2013  . MANDIBLE SURGERY  1991  . MASTECTOMY Right    malignant  . NASAL SINUS SURGERY  1990/2001  . ORIF FINGER FRACTURE  02/14/2012   Procedure: OPEN REDUCTION INTERNAL FIXATION (ORIF) METACARPAL (FINGER) FRACTURE;  Surgeon: Wynonia Sours, MD;  Location:  [place];  Service: Orthopedics;  Laterality: Right;  RIGHT FIFTH   . ORIF METACARPAL FRACTURE  8/13   rt   . PORT-A-CATH REMOVAL     in and now out  . REPAIR EXTENSOR TENDON Left 05/30/2013   Procedure: RELEASE TRANSPOSITION EXTENSOR POLLICUS LONGUS LEFT WRIST;  Surgeon: Wynonia Sours, MD;  Location: [place];  Service: Orthopedics;  Laterality: Left;  . rt mastectomy  2004   lymph nodes-7-axillary node dissection  . SHOULDER ARTHROSCOPY WITH ROTATOR CUFF REPAIR AND SUBACROMIAL DECOMPRESSION Left 11/22/2013   Procedure: LEFT SHOULDER ARTHROSCOPY WITH SUBACROMIAL DECOMPRESSION DISTAL CLAVICLE RESECTION REPAIR  ROTATOR CUFF AS NEEDED ;  Surgeon: Cammie Sickle., MD;  Location: [place];  Service: Orthopedics;  Laterality: Left;  . WRIST FRACTURE SURGERY     Social History   Socioeconomic History  . Marital status: Married    Spouse name: Not on file  . Number of children: Not on file  . Years of education: Not on file  . Highest education level: Not on file  Occupational History  . Occupation: retired  Scientific laboratory technician  . Financial resource strain: Patient refused  . Food insecurity:    Worry: Patient refused    Inability: Patient refused  . Transportation needs:    Medical: Patient  refused    Non-medical: Patient refused  Tobacco Use  . Smoking status: Never Smoker  . Smokeless tobacco: Never Used  Substance and Sexual Activity  . Alcohol use: No    Alcohol/week: 0.0 standard drinks  . Drug use: No  . Sexual activity: Not Currently    Partners: Male    Birth control/protection: Post-menopausal  Lifestyle  . Physical activity:    Days per week: Patient refused    Minutes per session: Patient refused  . Stress: Patient refused  Relationships  . Social connections:    Talks on phone: Patient refused    Gets together: Patient refused    Attends religious service: Patient refused    Active member of club or organization:  Patient refused    Attends meetings of clubs or organizations: Patient refused    Relationship status: Patient refused  Other Topics Concern  . Not on file  Social History Narrative  . Not on file   Family History  Problem Relation Age of Onset  . Heart disease Father   . Heart disease Mother   . Hypertension Mother   . Rectal cancer Mother        dx in her early 75s  . Colon polyps Mother   . Breast cancer Maternal Aunt        dx in her early 19s  . Heart disease Paternal Uncle   . Heart disease Paternal Grandmother   . Heart disease Paternal Grandfather   . Diabetes Brother   . Breast cancer Maternal Grandmother        dx in her 103s  . Breast cancer Cousin        dx in early 72s  . Breast cancer Cousin 67  . Hepatitis C Son   . Hypertension Son   . Post-traumatic stress disorder Son    Allergies  Allergen Reactions  . Clonazepam Shortness Of Breath and Other (See Comments)    Heart had a "weird feeling" and difficulty breathing  . Hydrocodone-Acetaminophen Hives and Nausea And Vomiting    GI Upset (intolerance)  . Rosuvastatin Other (See Comments)    myalgia  . Trazodone And Nefazodone Nausea Only and Other (See Comments)    Felt like "I was burning all over" -patient  . Acetaminophen Nausea Only  . Amoxicillin-Pot Clavulanate Nausea Only    flushing Did it involve swelling of the face/tongue/throat, SOB, or low BP? No Did it involve sudden or severe rash/hives, skin peeling, or any reaction on the inside of your mouth or nose? No Did you need to seek medical attention at a hospital or doctor's office? Unknown When did it last happen?5 years If all above answers are "NO", may proceed with cephalosporin use.   . Cefuroxime Axetil Nausea Only  . Cephalexin Nausea Only  . Levofloxacin Nausea Only  . Morphine And Related Other (See Comments)    Hallucinations  . Ondansetron Nausea And Vomiting  . Propoxyphene N-Acetaminophen Nausea And Vomiting  .  Sulfonamide Derivatives Itching  . Vioxx [Rofecoxib] Nausea And Vomiting  . Aspirin Nausea And Vomiting    Gastrointestinal upset  . Azithromycin Palpitations  . Clarithromycin Nausea Only and Anxiety  . Codeine Itching and Rash  . Milk-Related Compounds Nausea And Vomiting  . Seroquel [Quetiapine Fumarate] Palpitations    Heart racing   . Sulfamethoxazole Rash  . Tramadol Nausea And Vomiting   Prior to Admission medications   Medication Sig Start Date End Date Taking? Authorizing Provider  ALPRAZOLAM XR  3 MG 24 hr tablet Take 3 mg by mouth at bedtime. 06/30/16  Yes [provider]  anti-nausea (EMETROL) solution Take 15 mLs by mouth every 15 (fifteen) minutes as needed for nausea or vomiting.   Yes [provider]  BIOTIN PO Take 1 tablet by mouth daily.   Yes [provider]  bismuth subsalicylate (PEPTO BISMOL) 262 MG/15ML suspension Take 30 mLs by mouth every 6 (six) hours as needed for indigestion.   Yes [provider]  furosemide (LASIX) 20 MG tablet Take 20 mg by mouth daily as needed for edema.   Yes [provider]  Garlic (GARLIQUE PO) Take 1 capsule by mouth daily.   Yes [provider]  ibuprofen (ADVIL) 200 MG tablet Take 200 mg by mouth every 6 (six) hours as needed for headache or moderate pain.   Yes [provider]  loperamide (IMODIUM) 1 MG/5ML solution Take 2 mg by mouth as needed for diarrhea or loose stools.   Yes [provider]  metoprolol succinate (TOPROL-XL) 50 MG 24 hr tablet Take 50 mg by mouth at bedtime.  08/23/16  Yes [provider]  Multiple Vitamin (MULTIVITAMIN WITH MINERALS) TABS tablet Take 1 tablet by mouth daily.   Yes [provider]  Polyvinyl Alcohol-Povidone (REFRESH OP) Place 1 drop into both eyes at bedtime.   Yes [provider]  Pramoxine-Camphor-Zinc Acetate (ANTI ITCH EX) Apply 1 application topically at bedtime.   Yes [provider]   triazolam (HALCION) 0.25 MG tablet Take 0.125 mg by mouth at bedtime.  05/17/18  Yes [provider]  metoCLOPramide (REGLAN) 5 MG tablet Take 1 tablet (5 mg total) by mouth every 8 (eight) hours as needed for nausea. Patient not taking: Reported on 11/06/2018 10/20/18   Augusto Gamble B, NP  omeprazole (PRILOSEC) 20 MG capsule Take 1 capsule (20 mg total) by mouth daily. Take one tablet daily Patient not taking: Reported on 11/06/2018 12/06/17   Carmin Muskrat, MD  pantoprazole (PROTONIX) 20 MG tablet Take 1 tablet (20 mg total) by mouth daily. Patient not taking: Reported on 11/06/2018 10/20/18   Augusto Gamble B, NP  sucralfate (CARAFATE) 1 g tablet Take 1 tablet (1 g total) by mouth 4 (four) times daily -  with meals and at bedtime. Patient not taking: Reported on 06/15/2018 12/06/17   Carmin Muskrat, MD  traMADol (ULTRAM) 50 MG tablet Take 1 tablet (50 mg total) by mouth every 6 (six) hours as needed. Patient not taking: Reported on 11/06/2018 10/20/18   Augusto Gamble B, NP   No results found.  Positive ROS: All other systems have been reviewed and were otherwise negative with the exception of those mentioned in the HPI and as above.  Physical Exam: General: Alert, no acute distress Cardiovascular: No pedal edema Respiratory: No cyanosis, no use of accessory musculature GI: No organomegaly, abdomen is soft and non-tender Skin: No lesions in the area of chief complaint Neurologic: Sensation intact distally Psychiatric: Baseline confusion. Lymphatic: No axillary or cervical lymphadenopathy  MUSCULOSKELETAL:    Assessment: Closed right patella transverse fracture with displacement  Plan: -Given the displaced nature of this fracture and its association with loss of extensor function we have recommended moving forward with operative management.  Again this was discussed at length in the office with the patient and her husband.  No new questions today.  -We did again review the  procedure in detail and discussed the risk, benefits, and indications of the procedure.  All questions were solicited and answered to her satisfaction.  Her husband has signed consent on her behalf as her healthcare power of attorney.  -She will be admitted postoperatively for routine pain management as well as physical therapy to ensure that she is safe and can comply with touchdown weightbearing to the right leg.    Nicholes Stairs, MD Cell [phone]    11/07/2018 9:05 AM

## 2018-11-07 NOTE — Anesthesia Procedure Notes (Signed)
Procedure Name: Intubation Date/Time: 11/07/2018 10:08 AM Performed by: Renato Shin, CRNA Pre-anesthesia Checklist: Patient identified, Emergency Drugs available, Suction available and Patient being monitored Patient Re-evaluated:Patient Re-evaluated prior to induction Oxygen Delivery Method: Circle system utilized Preoxygenation: Pre-oxygenation with 100% oxygen Induction Type: IV induction and Rapid sequence Laryngoscope Size: Miller and 2 Grade View: Grade I Tube type: Oral Tube size: 7.0 mm Number of attempts: 1 Airway Equipment and Method: Stylet and Oral airway Placement Confirmation: ETT inserted through vocal cords under direct vision,  positive ETCO2 and breath sounds checked- equal and bilateral Secured at: 21 cm Tube secured with: Tape Dental Injury: Teeth and Oropharynx as per pre-operative assessment

## 2018-11-07 NOTE — Progress Notes (Signed)
[facility] Progress Note Patient Details:  DAZJA HOUCHIN 01-27-1951 [id number] Called and placed order with [facility] Patient ID: [name], female   DOB: 28-Feb-1951, 68 y.o.   MRN: [id number]   Carolynn Comment 11/07/2018, 2:22 PM

## 2018-11-07 NOTE — Brief Op Note (Signed)
11/07/2018  11:19 AM  PATIENT:  Kylie Tucker  68 y.o. female  PRE-OPERATIVE DIAGNOSIS:  right patella fracture  POST-OPERATIVE DIAGNOSIS:  right patella fracture  PROCEDURE:  Procedure(s): OPEN REDUCTION INTERNAL (ORIF) FIXATION PATELLA (Right)  SURGEON:  Surgeon(s) and Role:    Nicholes Stairs, MD - Primary  PHYSICIAN ASSISTANT:   ASSISTANTS: April Green, RNFA   ANESTHESIA:   regional and general  EBL:  30 cc  BLOOD ADMINISTERED:none  DRAINS: none   LOCAL MEDICATIONS USED:  MARCAINE     SPECIMEN:  No Specimen  DISPOSITION OF SPECIMEN:  N/A  COUNTS:  YES  TOURNIQUET:   Total Tourniquet Time Documented: Thigh (Right) - 41 minutes Total: Thigh (Right) - 41 minutes   DICTATION: .Note written in EPIC  PLAN OF CARE: Admit for overnight observation  PATIENT DISPOSITION:  PACU - hemodynamically stable.   Delay start of Pharmacological VTE agent (>24hrs) due to surgical blood loss or risk of bleeding: not applicable

## 2018-11-07 NOTE — Op Note (Signed)
11/07/2018  11:20 AM  PATIENT:  Kylie Tucker    PRE-OPERATIVE DIAGNOSIS:  right patella fracture  POST-OPERATIVE DIAGNOSIS:  Same  PROCEDURE:  OPEN REDUCTION INTERNAL (ORIF) FIXATION PATELLA  SURGEON:  Nicholes Stairs, MD  Assistant: April Green, RNFA  ANESTHESIA:   General  PREOPERATIVE INDICATIONS:  Kylie Tucker is a  68 y.o. female with a diagnosis of right patella fracture who elected for surgical management in order to restore the function of the extensor mechanism.    The risks benefits and alternatives were discussed with the patient preoperatively including but not limited to the risks of infection, bleeding, nerve injury, cardiopulmonary complications, the need for revision surgery, hardware prominence, hardware failure, the need for hardware removal, nonunion, malunion, posttraumatic arthritis, stiffness, loss of strength and function, among others, and the patient was willing to proceed.  We did have her husband also sign off on her consent given her early onset Dementia.  OPERATIVE IMPLANTS:  Arthrex 4.0 mm cannulated screws x2  Fiber Tape passed through the cannulated screws in a figure-of-eight cerclage fashion  #2 Fiber Wire for retinacular closure.  OPERATIVE FINDINGS: Displaced patella fracture of distal pole.  The distal piece was comminuted with no articular cartilage on the distal fragment.  The extensor mechanism was completely disrupted.  OPERATIVE PROCEDURE: The patient was brought to the operating room and placed in the supine position. General anesthesia was administered. IV antibiotics were given. The lower extremity was prepped and draped in usual sterile fashion. The leg was elevated and exsanguinated and the tourniquet was inflated. Time out was performed.   Anterior incision was made over the patella and the fracture fragments identified and cleaned of hematoma. The retinaculum was torn on either side.  I reduced the fracture anatomically  and held provisionally with a clamp and placed 2 guidewires for the cannulated screws.  The lengths were measured, after being confirmed on C-arm, and then I placed the screws, taking care to make sure that there were threads only on the proximal segment, providing compression at the fracture site, and the tips were not prominent proximally.  C-arm used to confirm reduction and position of the screws, and once I was satisfied with this I then used a Keith needle through the screws bringing the fibertape in a figure-of-eight type fashion. This provided excellent secondary fixation. I left the screw lengths slightly short of the far cortex in order to minimize the risk for rupture of the FiberTape over the tip of the screws.  The wounds were irrigated copiously and the retinaculum repaired with FiberWire followed by 2-0 monocryl for the subcutaneous tissue and staples for the skin with and sterile gauze for the skin. The wounds were also injected. A knee immobilizer was applied. The patient was awakened and returned to the PACU in stable and satisfactory condition. There were no complications.   Disposition:  She will be locked in full extension for 2 weeks.  25% WB to RLE at this time and begin at home PT starting at 2 week mark.  We will place in observation to ensure compliance with precautions and pain control.  She will begin daily asa immediately for DVT ppx.  Follow up with me in 2 weeks.

## 2018-11-07 NOTE — Discharge Instructions (Signed)
Maintain Brace on right knee at all times, unless icing  - keep wound clean and dry until follow up appointment  - remove post op bandages on Friday, and replace with once daily dry dressing  - apply ice to the right knee for 20-30 minutes at a time every hour that you are awake  - for the prevention of blood clots take a 325 mg aspirin once daily for 6 weeks  - return to see Dr. Stann Mainland in 2 weeks

## 2018-11-07 NOTE — Plan of Care (Signed)

## 2018-11-07 NOTE — Progress Notes (Signed)
The pharmacy was contacted in regards to the patient's allergy list ie: Tramadol, Morphine   The Pharmacist that I spoke with reported that " these are not true allergies"  MAR will be unchanged.  The patient refused to take her scheduled Tramadol.

## 2018-11-08 ENCOUNTER — Encounter (HOSPITAL_COMMUNITY): Payer: Self-pay | Admitting: Orthopedic Surgery

## 2018-11-08 DIAGNOSIS — S82001A Unspecified fracture of right patella, initial encounter for closed fracture: Secondary | ICD-10-CM | POA: Diagnosis not present

## 2018-11-08 DIAGNOSIS — S82031A Displaced transverse fracture of right patella, initial encounter for closed fracture: Secondary | ICD-10-CM | POA: Diagnosis not present

## 2018-11-08 MED ORDER — PROMETHAZINE HCL 25 MG PO TABS
12.5000 mg | ORAL_TABLET | Freq: Four times a day (QID) | ORAL | Status: DC | PRN
  Administered 2018-11-08: 12.5 mg via ORAL
  Filled 2018-11-08: qty 1

## 2018-11-08 MED ORDER — HYDROCODONE-ACETAMINOPHEN 7.5-325 MG PO TABS: 1.0000 | ORAL_TABLET | ORAL | 0 refills | Status: DC | PRN

## 2018-11-08 MED ORDER — METOPROLOL SUCCINATE ER 25 MG PO TB24: 50.0000 mg | ORAL_TABLET | Freq: Every day | ORAL | Status: DC

## 2018-11-08 MED ORDER — PROMETHAZINE HCL 12.5 MG PO TABS: 12.5000 mg | ORAL_TABLET | Freq: Four times a day (QID) | ORAL | 0 refills | Status: DC | PRN

## 2018-11-08 NOTE — Care Management Obs Status (Signed)
[place] NOTIFICATION   Patient Details  Name: ZIENNA AHLIN MRN: [id number] Date of Birth: [birth year]   Medicare Observation Status Notification Given:  Yes    Marilu Favre, RN 11/08/2018, 2:37 PM

## 2018-11-08 NOTE — Discharge Summary (Signed)
Patient ID: Kylie Tucker MRN: [id number] DOB/AGE: 1950-09-11 68 y.o.  Admit date: 11/07/2018 Discharge date:   Primary Diagnosis: Right patella fracture  Admission Diagnoses:  Past Medical History:  Diagnosis Date   Allergy    Anxiety    Bilateral ovarian cysts    Do yearly ultrasound.   Cancer ([place]) rt breast   2004/ chemo/ radiation   Cellulitis and abscess of upper arm and forearm    Chronic combined systolic and diastolic CHF (congestive heart failure) ([place])    Dementia (HCC)    Family history of breast cancer    Family history of rectal cancer    GERD (gastroesophageal reflux disease)    H/O Helicobacter infection 2017   History of right mastectomy 2004   chemo/ radiation   Hyperlipidemia    Hypertension    Insomnia    Left bundle branch block 2016   Migraines    Moderate mitral regurgitation    NICM (nonischemic cardiomyopathy) (HCC)    Pneumonia    PONV (postoperative nausea and vomiting)    cannot take zofran   Postmastectomy lymphedema rt side   Sentinel node    Discharge Diagnoses:   Principal Problem:   Displaced transverse fracture of right patella, initial encounter for closed fracture Active Problems:   Right patella fracture  Estimated body mass index is 29.03 kg/m as calculated from the following:   Height as of this encounter: 5' 2.25" (1.581 m).   Weight as of this encounter: 72.6 kg.  Procedure:  Procedure(s) (LRB): OPEN REDUCTION INTERNAL (ORIF) FIXATION PATELLA (Right)   Consults: None  HPI: Malavika had a fall onto her flexed right knee.  She sustained a transverse unstable fracture of the right patella.  She presented for the above surgery. Laboratory Data: Admission on 11/07/2018  Component Date Value Ref Range Status   Sodium 11/07/2018 139  135 - 145 mmol/L Final   Potassium 11/07/2018 4.0  3.5 - 5.1 mmol/L Final   Chloride 11/07/2018 105  98 - 111 mmol/L Final   CO2 11/07/2018 25  22 - 32 mmol/L  Final   Glucose, Bld 11/07/2018 109* 70 - 99 mg/dL Final   BUN 11/07/2018 17  8 - 23 mg/dL Final   Creatinine, Ser 11/07/2018 1.02* 0.44 - 1.00 mg/dL Final   Calcium 11/07/2018 9.0  8.9 - 10.3 mg/dL Final   GFR calc non Af Amer 11/07/2018 57* >60 mL/min Final   GFR calc Af Amer 11/07/2018 >60  >60 mL/min Final   Anion gap 11/07/2018 9  5 - 15 Final   Performed at [facility], [place] [address] [place], [place] [address]   WBC 11/07/2018 7.7  4.0 - 10.5 K/uL Final   RBC 11/07/2018 4.55  3.87 - 5.11 MIL/uL Final   Hemoglobin 11/07/2018 13.3  12.0 - 15.0 g/dL Final   HCT 11/07/2018 41.6  36.0 - 46.0 % Final   MCV 11/07/2018 91.4  80.0 - 100.0 fL Final   MCH 11/07/2018 29.2  26.0 - 34.0 pg Final   MCHC 11/07/2018 32.0  30.0 - 36.0 g/dL Final   RDW 11/07/2018 14.3  11.5 - 15.5 % Final   Platelets 11/07/2018 361  150 - 400 K/uL Final   nRBC 11/07/2018 0.0  0.0 - 0.2 % Final   Performed at [facility] [address], [place], [place] [address]     X-Rays:Dg Knee 1-2 Views Left  Result Date: 11/07/2018 CLINICAL DATA:  ORIF of patellar fracture FLUOROSCOPY  TIME:  37 seconds. Images: Four EXAM: DG C-ARM 61-120 MIN COMPARISON:  October 20, 2018 FINDINGS: ORIF of patellar fracture with 2 screws. IMPRESSION: ORIF of patellar fracture with 2 screws. Electronically Signed   By: Dorise Bullion III M.D   On: 11/07/2018 11:21   Dg Knee Complete 4 Views Right  Result Date: 10/20/2018 CLINICAL DATA:  Fall. Pain. EXAM: RIGHT KNEE - COMPLETE 4+ VIEW COMPARISON:  None. FINDINGS: There is a transverse, comminuted, and mildly displaced patellar fracture. This is best seen on the lateral radiograph. There is 5 mm distraction of the proximal from the comminuted distal segments. Large joint effusion. IMPRESSION: Transverse, comminuted, and mildly displaced patellar fracture. Large joint effusion. Electronically Signed   By: Staci Righter M.D.   On: 10/20/2018 14:50   Dg C-arm 1-60  Min  Result Date: 11/07/2018 CLINICAL DATA:  ORIF of patellar fracture FLUOROSCOPY TIME:  37 seconds. Images: Four EXAM: DG C-ARM 61-120 MIN COMPARISON:  October 20, 2018 FINDINGS: ORIF of patellar fracture with 2 screws. IMPRESSION: ORIF of patellar fracture with 2 screws. Electronically Signed   By: Dorise Bullion III M.D   On: 11/07/2018 11:21    EKG: Orders placed or performed during the hospital encounter of 06/14/18   EKG 12-Lead   EKG 12-Lead   EKG 12-Lead   EKG 12-Lead   EKG     Hospital Course: [name] is a 68 y.o. who was admitted to Hospital. They were brought to the operating room on 11/07/2018 and underwent Procedure(s): OPEN REDUCTION INTERNAL (ORIF) FIXATION PATELLA.  Patient tolerated the procedure well and was later transferred to the recovery room and then to the orthopaedic floor for postoperative care.  They were given PO and IV analgesics for pain control following their surgery.  They were given 24 hours of postoperative antibiotics of  Anti-infectives (From admission, onward)   Start     Dose/Rate Route Frequency Ordered Stop   11/07/18 0745  ceFAZolin (ANCEF) IVPB 2g/100 mL premix     2 g 200 mL/hr over 30 Minutes Intravenous On call to O.R. 11/07/18 0730 11/07/18 1041     and started on DVT prophylaxis in the form of Aspirin.   PT was ordered.  Discharge planning consulted to help with postop disposition and equipment needs.  Patient had a good night on the evening of surgery.  They started to get up OOB with therapy on day one.   Incision was healing well.  Patient was seen in rounds and was ready to go home.   Diet: Regular diet Activity:PWB Follow-up:in 2 weeks Disposition - Home Discharged Condition: good   Discharge Instructions    Call MD / Call 911   Complete by:  As directed    If you experience chest pain or shortness of breath, CALL 911 and be transported to the hospital emergency room.  If you develope a fever above 101 F, pus (white  drainage) or increased drainage or redness at the wound, or calf pain, call your surgeon's office.   Constipation Prevention   Complete by:  As directed    Drink plenty of fluids.  Prune juice may be helpful.  You may use a stool softener, such as Colace (over the counter) 100 mg twice a day.  Use MiraLax (over the counter) for constipation as needed.   DME Bedside commode   Complete by:  As directed    Patient needs a bedside commode to treat with the following condition:  Immobility  Diet - low sodium heart healthy   Complete by:  As directed    Increase activity slowly as tolerated   Complete by:  As directed    Walker rolling   Complete by:  As directed      Allergies as of 11/08/2018      Reactions   Clonazepam Shortness Of Breath, Other (See Comments)   Heart had a "weird feeling" and difficulty breathing   Hydrocodone-acetaminophen Nausea And Vomiting   GI Upset (intolerance) Patient stated 4/28 that she is able to take Hydrocodone   Rosuvastatin Other (See Comments)   myalgia   Trazodone And Nefazodone Nausea Only, Other (See Comments)   Felt like "I was burning all over" -patient   Acetaminophen Nausea Only   Amoxicillin-pot Clavulanate Nausea Only   flushing Did it involve swelling of the face/tongue/throat, SOB, or low BP? No Did it involve sudden or severe rash/hives, skin peeling, or any reaction on the inside of your mouth or nose? No Did you need to seek medical attention at a hospital or doctor's office? Unknown When did it last happen?5 years If all above answers are NO, may proceed with cephalosporin use.   Cefuroxime Axetil Nausea Only   Cephalexin Nausea Only   Levofloxacin Nausea Only   Morphine And Related Other (See Comments)   Hallucinations   Ondansetron Nausea And Vomiting   Propoxyphene N-acetaminophen Nausea And Vomiting   Sulfonamide Derivatives Itching   Vioxx [rofecoxib] Nausea And Vomiting   Aspirin Nausea And Vomiting    Gastrointestinal upset   Azithromycin Palpitations   Clarithromycin Nausea Only, Anxiety   Codeine Itching, Rash   Milk-related Compounds Nausea And Vomiting   Seroquel [quetiapine Fumarate] Palpitations   Heart racing   Sulfamethoxazole Rash   Tramadol Nausea And Vomiting      Medication List    TAKE these medications   ALPRAZolam XR 3 MG 24 hr tablet Generic drug:  ALPRAZolam Take 3 mg by mouth at bedtime.   ANTI ITCH EX Apply 1 application topically at bedtime.   anti-nausea solution Take 15 mLs by mouth every 15 (fifteen) minutes as needed for nausea or vomiting.   BIOTIN PO Take 1 tablet by mouth daily.   bismuth subsalicylate 035 KK/93GH suspension Commonly known as:  PEPTO BISMOL Take 30 mLs by mouth every 6 (six) hours as needed for indigestion.   furosemide 20 MG tablet Commonly known as:  LASIX Take 20 mg by mouth daily as needed for edema.   GARLIQUE PO Take 1 capsule by mouth daily.   HYDROcodone-acetaminophen 7.5-325 MG tablet Commonly known as:  NORCO Take 1-2 tablets by mouth every 4 (four) hours as needed for severe pain (pain score 7-10).   ibuprofen 200 MG tablet Commonly known as:  ADVIL Take 200 mg by mouth every 6 (six) hours as needed for headache or moderate pain.   loperamide 1 MG/5ML solution Commonly known as:  IMODIUM Take 2 mg by mouth as needed for diarrhea or loose stools.   metoCLOPramide 5 MG tablet Commonly known as:  REGLAN Take 1 tablet (5 mg total) by mouth every 8 (eight) hours as needed for nausea.   metoprolol succinate 50 MG 24 hr tablet Commonly known as:  TOPROL-XL Take 50 mg by mouth at bedtime.   multivitamin with minerals Tabs tablet Take 1 tablet by mouth daily.   omeprazole 20 MG capsule Commonly known as:  PRILOSEC Take 1 capsule (20 mg total) by mouth daily. Take one tablet daily  pantoprazole 20 MG tablet Commonly known as:  PROTONIX Take 1 tablet (20 mg total) by mouth daily.   promethazine 12.5  MG tablet Commonly known as:  PHENERGAN Take 1 tablet (12.5 mg total) by mouth every 6 (six) hours as needed for nausea or vomiting.   REFRESH OP Place 1 drop into both eyes at bedtime.   sucralfate 1 g tablet Commonly known as:  Carafate Take 1 tablet (1 g total) by mouth 4 (four) times daily -  with meals and at bedtime.   traMADol 50 MG tablet Commonly known as:  ULTRAM Take 1 tablet (50 mg total) by mouth every 6 (six) hours as needed.   triazolam 0.25 MG tablet Commonly known as:  HALCION Take 0.125 mg by mouth at bedtime.            Durable Medical Equipment  (From admission, onward)         Start     Ordered   11/08/18 1127  For home use only DME 3 n 1  Once     11/08/18 1127   11/08/18 1127  For home use only DME Walker rolling  Once    Question:  Patient needs a walker to treat with the following condition  Answer:  Right patella fracture   11/08/18 1127   11/08/18 0000  DME Bedside commode    Question:  Patient needs a bedside commode to treat with the following condition  Answer:  Immobility   11/08/18 1338         Follow-up Information    Nicholes Stairs, MD In 2 weeks.   Specialty:  Orthopedic Surgery Why:  For suture removal, For wound re-check Contact information: [address] [place] [address] [place] [address] [phone]           Signed: Geralynn Rile, MD Orthopaedic Surgery 11/08/2018, 1:38 PM

## 2018-11-08 NOTE — Progress Notes (Signed)
[name] to be D/C'd  per MD order. Discussed with the patient and all questions fully answered.  VSS, Skin clean, dry and intact without evidence of skin break down, no evidence of skin tears noted.  IV catheter discontinued intact. Site without signs and symptoms of complications. Dressing and pressure applied.  An After Visit Summary was printed and given to the patient. Patient received prescription.  D/c education completed with patient/family including follow up instructions, medication list, d/c activities limitations if indicated, with other d/c instructions as indicated by MD - patient able to verbalize understanding, all questions fully answered.   Patient instructed to return to ED, call 911, or call MD for any changes in condition.   Patient to be escorted via [place], and D/C home via private auto.

## 2018-11-08 NOTE — Anesthesia Postprocedure Evaluation (Signed)
Anesthesia Post Note  Patient: Kylie Tucker  Procedure(s) Performed: OPEN REDUCTION INTERNAL (ORIF) FIXATION PATELLA (Right Knee)     Patient location during evaluation: PACU Anesthesia Type: General Level of consciousness: awake Pain management: pain level controlled Vital Signs Assessment: post-procedure vital signs reviewed and stable Respiratory status: spontaneous breathing Cardiovascular status: stable Postop Assessment: no apparent nausea or vomiting Anesthetic complications: no    Last Vitals:  Vitals:   11/08/18 0452 11/08/18 1343  BP: (!) 150/81 (!) 167/81  Pulse: 89 89  Resp:  16  Temp:  37.3 C  SpO2:  96%    Last Pain:  Vitals:   11/08/18 1343  TempSrc: Oral  PainSc:                  Shamera Yarberry

## 2018-11-08 NOTE — Evaluation (Signed)
Physical Therapy Evaluation Patient Details Name: Kylie Tucker MRN: [id number] DOB: 01-Aug-1950 Today's Date: 11/08/2018   History of Present Illness  Pt is a 68 y.o. female admitted 11/07/18 after fall sustaining R patella fx. Now s/p R patella ORIF 4/28. PMH includes dementia, CHF, HTN, CA, anxiety.    Clinical Impression  Pt presents with an overall decrease in functional mobility secondary to above. PTA, pt reports mod indep with SPC and lives with husband. Educ on PWB precautions, brace wear, positioning, therex, and importance of mobility. Today, pt able to transfer and amb with RW, intermittent supervision for safety. Overall, pt moving very well despite c/o 8/10 pain. If to remain admitted, will follow acutely to address established goals. Per MD note, to start HHPT in 2 weeks.     Follow Up Recommendations Follow surgeon's recommendation for DC plan and follow-up therapies;Supervision for mobility/OOB    Equipment Recommendations  Rolling walker with 5" wheels;3in1 (PT)    Recommendations for Other Services       Precautions / Restrictions Precautions Precautions: Fall Required Braces or Orthoses: Other Brace Other Brace: Bledsoe brace locked in extension Restrictions Weight Bearing Restrictions: Yes RLE Weight Bearing: Touchdown weight bearing RLE Partial Weight Bearing Percentage or Pounds: 25% PWB      Mobility  Bed Mobility Overal bed mobility: Modified Independent             General bed mobility comments: Increased time, no physical assist required  Transfers Overall transfer level: Needs assistance Equipment used: Rolling walker (2 wheeled) Transfers: Sit to/from Stand Sit to Stand: Supervision         General transfer comment: Good ability to sit<>stand to RW while keeping RLE extended in brace. Able to stand from bed, toilet and recliner without physical assist  Ambulation/Gait Ambulation/Gait assistance: Supervision Gait Distance (Feet): 60  Feet Assistive device: Rolling walker (2 wheeled) Gait Pattern/deviations: Step-to pattern;Decreased weight shift to right;Antalgic Gait velocity: Decreased Gait velocity interpretation: <1.8 ft/sec, indicate of risk for recurrent falls General Gait Details: Slow, antalgic gait with RW; supervision for safety. Educ on precautions; pt able to repeat RLE 25% PWB throughotu session  Stairs Stairs: (Pt declined; educ on ascending/descending steps with L-side rail, as well as technique with RW having husband hold walker; pt continues to decline reports husband can help)          Wheelchair Mobility    Modified Rankin (Stroke Patients Only)       Balance Overall balance assessment: Needs assistance   Sitting balance-Leahy Scale: Good       Standing balance-Leahy Scale: Fair Standing balance comment: Can static stand to brush teeth without UE support                             Pertinent Vitals/Pain Pain Assessment: 0-10 Pain Score: 8  Pain Location: RLE Pain Descriptors / Indicators: Discomfort Pain Intervention(s): Monitored during session;Repositioned;Ice applied    Home Living Family/patient expects to be discharged to:: Private residence Living Arrangements: Spouse/significant other Available Help at Discharge: Family;Available 24 hours/day Type of Home: House Home Access: Stairs to enter Entrance Stairs-Rails: Left Entrance Stairs-Number of Steps: 6 Home Layout: One level Home Equipment: Grab bars - toilet;Cane - single point;Crutches      Prior Function Level of Independence: Independent with assistive device(s)         Comments: Mod indep with SPC     Hand Dominance  Extremity/Trunk Assessment   Upper Extremity Assessment Upper Extremity Assessment: Overall WFL for tasks assessed    Lower Extremity Assessment Lower Extremity Assessment: RLE deficits/detail RLE Deficits / Details: s/p R patella ORIF w/ knee brace locked in  extension RLE: Unable to fully assess due to immobilization    Cervical / Trunk Assessment Cervical / Trunk Assessment: Normal  Communication   Communication: No difficulties  Cognition Arousal/Alertness: Awake/alert Behavior During Therapy: WFL for tasks assessed/performed Overall Cognitive Status: History of cognitive impairments - at baseline                                 General Comments: Per chart, h/o dementia. WFL for basic tasks       General Comments      Exercises     Assessment/Plan    PT Assessment Patient needs continued PT services  PT Problem List Decreased strength;Decreased range of motion;Decreased activity tolerance;Decreased mobility;Decreased balance;Decreased knowledge of use of DME;Decreased knowledge of precautions;Pain       PT Treatment Interventions DME instruction;Gait training;Stair training;Functional mobility training;Therapeutic activities;Therapeutic exercise;Balance training;Patient/family education    PT Goals (Current goals can be found in the Care Plan section)  Acute Rehab PT Goals Patient Stated Goal: Return home today PT Goal Formulation: With patient Time For Goal Achievement: 11/22/18 Potential to Achieve Goals: Good    Frequency Min 5X/week   Barriers to discharge        Co-evaluation               AM-PAC PT "6 Clicks" Mobility  Outcome Measure Help needed turning from your back to your side while in a flat bed without using bedrails?: None Help needed moving from lying on your back to sitting on the side of a flat bed without using bedrails?: None Help needed moving to and from a bed to a chair (including a wheelchair)?: None Help needed standing up from a chair using your arms (e.g., wheelchair or bedside chair)?: None Help needed to walk in hospital room?: A Little Help needed climbing 3-5 steps with a railing? : A Little 6 Click Score: 22    End of Session Equipment Utilized During Treatment:  Other (comment)(RLE bledsoe brace locked in extension) Activity Tolerance: Patient tolerated treatment well Patient left: in chair;with call bell/phone within reach;with chair alarm set Nurse Communication: Mobility status PT Visit Diagnosis: Other abnormalities of gait and mobility (R26.89)    Time: 5809-9833 PT Time Calculation (min) (ACUTE ONLY): 20 min   Charges:   PT Evaluation $PT Eval Low Complexity: [place], PT, DPT Acute Rehabilitation Services  Pager [phone] Office [phone]  Derry Lory 11/08/2018, 9:15 AM

## 2018-11-08 NOTE — TOC Transition Note (Signed)
Transition of Care [facility]) - CM/SW Discharge Note   Patient Details  Name: Kylie Tucker MRN: [id number] Date of Birth: [birth year]  [facility]) CM/SW Contact:  Marilu Favre, RN Phone Number: 11/08/2018, 11:41 AM   Clinical Narrative:     Ordered walker and 3 in 1 from [place]. Per MD note HHPT to start in 2 weeks after follow up appointment . Discussed with patient .  Final next level of care: Home/Self Care Barriers to Discharge: Continued Medical Work up   Patient Goals and CMS Choice Patient states their goals for this hospitalization and ongoing recovery are:: to go home  CMS Medicare.gov Compare Post Acute Care list provided to:: Patient Choice offered to / list presented to : NA  Discharge Placement                       Discharge Plan and Services   Discharge Planning Services: CM Consult            DME Arranged: 3-N-1, Walker rolling DME Agency: [employer] Date DME Agency Contacted: 11/08/18 Time DME Agency Contacted: 58 Representative spoke with at DME Agency: [place]: NA [place] Agency: NA        Social Determinants of Health ([place]) Interventions     Readmission Risk Interventions No flowsheet data found.

## 2018-11-27 DIAGNOSIS — Z9011 Acquired absence of right breast and nipple: Secondary | ICD-10-CM | POA: Diagnosis not present

## 2018-11-27 DIAGNOSIS — G3183 Dementia with Lewy bodies: Secondary | ICD-10-CM | POA: Diagnosis not present

## 2018-11-27 DIAGNOSIS — F419 Anxiety disorder, unspecified: Secondary | ICD-10-CM | POA: Diagnosis not present

## 2018-11-27 DIAGNOSIS — G43909 Migraine, unspecified, not intractable, without status migrainosus: Secondary | ICD-10-CM | POA: Diagnosis not present

## 2018-11-27 DIAGNOSIS — E785 Hyperlipidemia, unspecified: Secondary | ICD-10-CM | POA: Diagnosis not present

## 2018-11-27 DIAGNOSIS — Z9181 History of falling: Secondary | ICD-10-CM | POA: Diagnosis not present

## 2018-11-27 DIAGNOSIS — K219 Gastro-esophageal reflux disease without esophagitis: Secondary | ICD-10-CM | POA: Diagnosis not present

## 2018-11-27 DIAGNOSIS — Z8619 Personal history of other infectious and parasitic diseases: Secondary | ICD-10-CM | POA: Diagnosis not present

## 2018-11-27 DIAGNOSIS — S82031D Displaced transverse fracture of right patella, subsequent encounter for closed fracture with routine healing: Secondary | ICD-10-CM | POA: Diagnosis not present

## 2018-11-27 DIAGNOSIS — F0280 Dementia in other diseases classified elsewhere without behavioral disturbance: Secondary | ICD-10-CM | POA: Diagnosis not present

## 2018-11-27 DIAGNOSIS — Z853 Personal history of malignant neoplasm of breast: Secondary | ICD-10-CM | POA: Diagnosis not present

## 2018-11-27 DIAGNOSIS — I110 Hypertensive heart disease with heart failure: Secondary | ICD-10-CM | POA: Diagnosis not present

## 2018-11-27 DIAGNOSIS — I5042 Chronic combined systolic (congestive) and diastolic (congestive) heart failure: Secondary | ICD-10-CM | POA: Diagnosis not present

## 2018-11-27 DIAGNOSIS — G4700 Insomnia, unspecified: Secondary | ICD-10-CM | POA: Diagnosis not present

## 2018-12-11 ENCOUNTER — Other Ambulatory Visit: Payer: Self-pay

## 2018-12-11 DIAGNOSIS — I110 Hypertensive heart disease with heart failure: Secondary | ICD-10-CM | POA: Insufficient documentation

## 2018-12-11 DIAGNOSIS — R456 Violent behavior: Secondary | ICD-10-CM | POA: Insufficient documentation

## 2018-12-11 DIAGNOSIS — R4689 Other symptoms and signs involving appearance and behavior: Secondary | ICD-10-CM | POA: Diagnosis not present

## 2018-12-11 DIAGNOSIS — Z79899 Other long term (current) drug therapy: Secondary | ICD-10-CM | POA: Diagnosis not present

## 2018-12-11 DIAGNOSIS — Z853 Personal history of malignant neoplasm of breast: Secondary | ICD-10-CM | POA: Diagnosis not present

## 2018-12-11 DIAGNOSIS — F419 Anxiety disorder, unspecified: Secondary | ICD-10-CM | POA: Diagnosis not present

## 2018-12-11 DIAGNOSIS — I5022 Chronic systolic (congestive) heart failure: Secondary | ICD-10-CM | POA: Diagnosis not present

## 2018-12-11 DIAGNOSIS — F0391 Unspecified dementia with behavioral disturbance: Secondary | ICD-10-CM | POA: Diagnosis not present

## 2018-12-11 DIAGNOSIS — F329 Major depressive disorder, single episode, unspecified: Secondary | ICD-10-CM | POA: Insufficient documentation

## 2018-12-12 ENCOUNTER — Other Ambulatory Visit: Payer: Self-pay

## 2018-12-12 ENCOUNTER — Emergency Department (HOSPITAL_COMMUNITY)
Admission: EM | Admit: 2018-12-12 | Discharge: 2018-12-13 | Disposition: A | Discharge status: Other Acute Inpatient Hospital | Payer: PPO | Attending: Emergency Medicine | Admitting: Emergency Medicine

## 2018-12-12 ENCOUNTER — Encounter (HOSPITAL_COMMUNITY): Payer: Self-pay | Admitting: Emergency Medicine

## 2018-12-12 DIAGNOSIS — R4689 Other symptoms and signs involving appearance and behavior: Secondary | ICD-10-CM

## 2018-12-12 DIAGNOSIS — R456 Violent behavior: Secondary | ICD-10-CM | POA: Diagnosis not present

## 2018-12-12 LAB — CBC WITH DIFFERENTIAL/PLATELET
Abs Immature Granulocytes: 0.02 10*3/uL (ref 0.00–0.07)
Basophils Absolute: 0.1 10*3/uL (ref 0.0–0.1)
Basophils Relative: 1 %
Eosinophils Absolute: 0.1 10*3/uL (ref 0.0–0.5)
Eosinophils Relative: 2 %
HCT: 43.2 % (ref 36.0–46.0)
Hemoglobin: 14 g/dL (ref 12.0–15.0)
Immature Granulocytes: 0 %
Lymphocytes Relative: 24 %
Lymphs Abs: 1.8 10*3/uL (ref 0.7–4.0)
MCH: 28.7 pg (ref 26.0–34.0)
MCHC: 32.4 g/dL (ref 30.0–36.0)
MCV: 88.7 fL (ref 80.0–100.0)
Monocytes Absolute: 0.5 10*3/uL (ref 0.1–1.0)
Monocytes Relative: 7 %
Neutro Abs: 4.9 10*3/uL (ref 1.7–7.7)
Neutrophils Relative %: 66 %
Platelets: 292 10*3/uL (ref 150–400)
RBC: 4.87 MIL/uL (ref 3.87–5.11)
RDW: 14.2 % (ref 11.5–15.5)
WBC: 7.4 10*3/uL (ref 4.0–10.5)
nRBC: 0 % (ref 0.0–0.2)

## 2018-12-12 LAB — URINALYSIS, ROUTINE W REFLEX MICROSCOPIC
Bilirubin Urine: NEGATIVE
Glucose, UA: NEGATIVE mg/dL
Ketones, ur: NEGATIVE mg/dL
Leukocytes,Ua: NEGATIVE
Nitrite: NEGATIVE
Protein, ur: NEGATIVE mg/dL
Specific Gravity, Urine: 1.005 (ref 1.005–1.030)
pH: 5 (ref 5.0–8.0)

## 2018-12-12 LAB — BASIC METABOLIC PANEL
Anion gap: 12 (ref 5–15)
BUN: 20 mg/dL (ref 8–23)
CO2: 20 mmol/L — ABNORMAL LOW (ref 22–32)
Calcium: 9.1 mg/dL (ref 8.9–10.3)
Chloride: 108 mmol/L (ref 98–111)
Creatinine, Ser: 1.09 mg/dL — ABNORMAL HIGH (ref 0.44–1.00)
GFR calc Af Amer: >60 mL/min (ref >60)
GFR calc non Af Amer: 52 mL/min — ABNORMAL LOW (ref >60)
Glucose, Bld: 114 mg/dL — ABNORMAL HIGH (ref 70–99)
Potassium: 4.5 mmol/L (ref 3.5–5.1)
Sodium: 140 mmol/L (ref 135–145)

## 2018-12-12 LAB — RAPID URINE DRUG SCREEN, HOSP PERFORMED
Amphetamines: NOT DETECTED
Barbiturates: NOT DETECTED
Benzodiazepines: POSITIVE — AB
Cocaine: NOT DETECTED
Opiates: NOT DETECTED
Tetrahydrocannabinol: NOT DETECTED

## 2018-12-12 LAB — ETHANOL: Alcohol, Ethyl (B): <10 mg/dL (ref <10)

## 2018-12-12 NOTE — ED Notes (Signed)
Pt's husband brought belongings. Belongings placed in locker.

## 2018-12-12 NOTE — Progress Notes (Signed)
CSW in contact with Pts husband, Ph: [phone] to discuss concerns regarding placement. CSW left voicmenail requesting call back.  Boykin Peek, MSW, [facility]  Clinical Social Worker [phone]

## 2018-12-12 NOTE — Progress Notes (Signed)
This RN spoke with patients husband. He is upset about her being transferred to [facility] in the morning. Explained to him the process of placing someone, he requests to speak with someone from social work at this time. Social work notified and says she will call and speak with him.

## 2018-12-12 NOTE — Progress Notes (Signed)
CSW in contact with Pt's husband to discuss disposition. CSW informed husband of placement from the ED protocol. CSW advised him to go through his PCP if future placement is needed. Husband reported that he was receptive to placement at [facility].   CSW called to make ED aware of Pt's husband decision. CSW was informed by Nurse that Pt has been IVD and that she would update Pt's husband.   CSW will CSW will continue to follow for D/C needs.  [place], [place] Transitions of Care  Clinical Social Worker  Ph: [phone]

## 2018-12-12 NOTE — Progress Notes (Signed)
Pt accepted to [facility] [place] Joaquim Nam, MD is the accepting/attending provider.  Call report to [phone]  [facility]enn[facility] ED notified.   Pt is IVC  Pt may be transported by [facility] Pt scheduled  to arrive at [facility] on 12/13/18 after 8 AM.  Romie Minus T. Judi Cong, MSW, [place] Disposition Clinical Social Work [phone] (cell) [phone] (office)  IVC faxed to [place] attn: [facility]
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
## 2018-12-12 NOTE — Progress Notes (Signed)
Pt. meets criteria for inpatient treatment per Mordecai Maes, NP.  No appropriate beds available at [facility]. Referred out to the following hospitals: [facility]     [facility]    Disposition CSW will continue to follow for placement.  Areatha Keas. Judi Cong, MSW, [place] Disposition Clinical Social Work [phone] (cell) [phone] (office)
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
## 2018-12-13 DIAGNOSIS — R141 Gas pain: Secondary | ICD-10-CM | POA: Diagnosis not present

## 2018-12-13 DIAGNOSIS — R1084 Generalized abdominal pain: Secondary | ICD-10-CM | POA: Diagnosis not present

## 2018-12-13 DIAGNOSIS — M25561 Pain in right knee: Secondary | ICD-10-CM | POA: Diagnosis not present

## 2018-12-13 DIAGNOSIS — Z113 Encounter for screening for infections with a predominantly sexual mode of transmission: Secondary | ICD-10-CM | POA: Diagnosis not present

## 2018-12-13 DIAGNOSIS — F413 Other mixed anxiety disorders: Secondary | ICD-10-CM | POA: Diagnosis not present

## 2018-12-13 DIAGNOSIS — R197 Diarrhea, unspecified: Secondary | ICD-10-CM | POA: Diagnosis not present

## 2018-12-13 DIAGNOSIS — I110 Hypertensive heart disease with heart failure: Secondary | ICD-10-CM | POA: Diagnosis not present

## 2018-12-13 DIAGNOSIS — H11432 Conjunctival hyperemia, left eye: Secondary | ICD-10-CM | POA: Diagnosis not present

## 2018-12-13 DIAGNOSIS — H11433 Conjunctival hyperemia, bilateral: Secondary | ICD-10-CM | POA: Diagnosis not present

## 2018-12-13 DIAGNOSIS — E782 Mixed hyperlipidemia: Secondary | ICD-10-CM | POA: Diagnosis not present

## 2018-12-13 DIAGNOSIS — R456 Violent behavior: Secondary | ICD-10-CM | POA: Diagnosis not present

## 2018-12-13 DIAGNOSIS — E785 Hyperlipidemia, unspecified: Secondary | ICD-10-CM | POA: Diagnosis not present

## 2018-12-13 DIAGNOSIS — Z5181 Encounter for therapeutic drug level monitoring: Secondary | ICD-10-CM | POA: Diagnosis not present

## 2018-12-13 DIAGNOSIS — F1990 Other psychoactive substance use, unspecified, uncomplicated: Secondary | ICD-10-CM | POA: Diagnosis not present

## 2018-12-13 DIAGNOSIS — R238 Other skin changes: Secondary | ICD-10-CM | POA: Diagnosis not present

## 2018-12-13 DIAGNOSIS — R451 Restlessness and agitation: Secondary | ICD-10-CM | POA: Diagnosis not present

## 2018-12-13 DIAGNOSIS — D510 Vitamin B12 deficiency anemia due to intrinsic factor deficiency: Secondary | ICD-10-CM | POA: Diagnosis not present

## 2018-12-13 DIAGNOSIS — E559 Vitamin D deficiency, unspecified: Secondary | ICD-10-CM | POA: Diagnosis not present

## 2018-12-13 DIAGNOSIS — F4310 Post-traumatic stress disorder, unspecified: Secondary | ICD-10-CM | POA: Diagnosis not present

## 2018-12-13 DIAGNOSIS — F419 Anxiety disorder, unspecified: Secondary | ICD-10-CM | POA: Diagnosis not present

## 2018-12-13 DIAGNOSIS — Z1329 Encounter for screening for other suspected endocrine disorder: Secondary | ICD-10-CM | POA: Diagnosis not present

## 2018-12-13 DIAGNOSIS — I10 Essential (primary) hypertension: Secondary | ICD-10-CM | POA: Diagnosis not present

## 2018-12-13 DIAGNOSIS — F329 Major depressive disorder, single episode, unspecified: Secondary | ICD-10-CM | POA: Diagnosis not present

## 2018-12-13 DIAGNOSIS — Z131 Encounter for screening for diabetes mellitus: Secondary | ICD-10-CM | POA: Diagnosis not present

## 2018-12-13 MED ORDER — METOPROLOL SUCCINATE ER 25 MG PO TB24
50.0000 mg | ORAL_TABLET | Freq: Every day | ORAL | Status: DC
  Administered 2018-12-13: 50 mg via ORAL
  Filled 2018-12-13: qty 2

## 2018-12-13 MED ORDER — ALPRAZOLAM 0.5 MG PO TABS
1.0000 mg | ORAL_TABLET | Freq: Every day | ORAL | Status: DC
  Administered 2018-12-13: 1 mg via ORAL
  Filled 2018-12-13: qty 2

## 2018-12-13 MED ORDER — ALPRAZOLAM ER 1 MG PO TB24: 3.0000 mg | ORAL_TABLET | Freq: Every day | ORAL | Status: DC

## 2018-12-13 MED ORDER — FUROSEMIDE 40 MG PO TABS: 20.0000 mg | ORAL_TABLET | Freq: Every day | ORAL | Status: DC

## 2018-12-13 NOTE — ED Notes (Signed)
Deputy here to transport pt.

## 2018-12-13 NOTE — ED Notes (Signed)
Report given to [facility], Therapist, sports at [employer].

## 2018-12-18 ENCOUNTER — Telehealth: Payer: Self-pay | Admitting: Obstetrics and Gynecology

## 2018-12-18 NOTE — Telephone Encounter (Signed)
Call placed to schedule follow up ultrasound. Unable to leave a voicemail message as automated message states"call could not be completed because there are restrictions on this line".

## 2018-12-22 ENCOUNTER — Other Ambulatory Visit: Payer: Self-pay | Admitting: Cardiology

## 2018-12-27 NOTE — Telephone Encounter (Signed)
Call placed to schedule follow up ultrasound. Unable to leave a message on mobile phone number, as the voice mailbox was full. Called home number on file, left a voicemail message requesting a return call   cc: Lamont Snowball, RN

## 2018-12-27 NOTE — Telephone Encounter (Signed)
MyChart message sent to patient.

## 2018-12-28 DIAGNOSIS — Z1389 Encounter for screening for other disorder: Secondary | ICD-10-CM | POA: Diagnosis not present

## 2018-12-28 DIAGNOSIS — G4709 Other insomnia: Secondary | ICD-10-CM | POA: Diagnosis not present

## 2018-12-28 DIAGNOSIS — E6609 Other obesity due to excess calories: Secondary | ICD-10-CM | POA: Diagnosis not present

## 2018-12-28 DIAGNOSIS — Z6830 Body mass index (BMI) 30.0-30.9, adult: Secondary | ICD-10-CM | POA: Diagnosis not present

## 2019-02-06 NOTE — Telephone Encounter (Signed)
No response to phone calls and My Chart message attempts to reach patient.  Please advise if routine letter should be mailed?

## 2019-02-07 NOTE — Telephone Encounter (Signed)
Patient has annual exam in the fall.  Will reassess at that time.

## 2019-04-02 ENCOUNTER — Other Ambulatory Visit: Payer: Self-pay | Admitting: Oncology

## 2019-04-12 ENCOUNTER — Telehealth: Payer: Self-pay | Admitting: Oncology

## 2019-04-12 NOTE — Telephone Encounter (Signed)
[facility] 10/29 moved appointments from 10/29 to 11/13. Not able to leave message. Voicemail full. Schedule mailed.

## 2019-04-13 DIAGNOSIS — Z6831 Body mass index (BMI) 31.0-31.9, adult: Secondary | ICD-10-CM | POA: Diagnosis not present

## 2019-04-13 DIAGNOSIS — K219 Gastro-esophageal reflux disease without esophagitis: Secondary | ICD-10-CM | POA: Diagnosis not present

## 2019-04-13 DIAGNOSIS — J309 Allergic rhinitis, unspecified: Secondary | ICD-10-CM | POA: Diagnosis not present

## 2019-04-13 DIAGNOSIS — G4700 Insomnia, unspecified: Secondary | ICD-10-CM | POA: Diagnosis not present

## 2019-04-13 DIAGNOSIS — I10 Essential (primary) hypertension: Secondary | ICD-10-CM | POA: Diagnosis not present

## 2019-04-24 NOTE — Progress Notes (Signed)
68 y.o. G58P0012 Married Caucasian female here for annual exam.    Followed for bilateral ovarian cysts.  Does yearly ultrasound and CA125.   Denies vaginal bleeding.  No pelvic pain.  No blood in the urine or stool.   States she has some diarrhea.   PCP:  Redmond School, MD   Patient's last menstrual period was 01/10/2003.           Sexually active: No.  The current method of family planning is post menopausal status.    Exercising: Yes.    walks on treadmill Smoker:  no  Health Maintenance: Pap: 04-21-18 Neg, 09-26-15 [id number] HPV, 08-01-12 Neg History of abnormal Pap:  Yes,hx cryotherapy to cervix in her 30s   MMG: 06-01-17 Rt.Mast./Lt.MMG Density C/Neg/BiRads1--MRI Neg/BiRads1--patient knows she needs to schedule Colonoscopy: 2015 polyp;next due 2020--she needs to schedule BMD: 12-02-14  Result :Osteopenia with Dr.Magrinat TDaP:  PCP Gardasil:   n/a HIV:04-06-18 NR Hep C: Neg Screening Labs:  Today.    reports that she has never smoked. She has never used smokeless tobacco. She reports that she does not drink alcohol or use drugs.  Past Medical History:  Diagnosis Date  . Allergy   . Anxiety   . Bilateral ovarian cysts    Do yearly ultrasound.  . Cancer ([place]) rt breast   2004/ chemo/ radiation  . Cellulitis and abscess of upper arm and forearm   . Chronic combined systolic and diastolic CHF (congestive heart failure) ([place])   . Dementia ([place])   . Family history of breast cancer   . Family history of rectal cancer   . GERD (gastroesophageal reflux disease)   . H/O Helicobacter infection 2017  . History of right mastectomy 2004   chemo/ radiation  . Hyperlipidemia   . Hypertension   . Insomnia   . Left bundle branch block 2016  . Migraines   . Moderate mitral regurgitation   . NICM (nonischemic cardiomyopathy) ([place])   . Pneumonia   . PONV (postoperative nausea and vomiting)    cannot take zofran  . Postmastectomy lymphedema rt side  . Sentinel node      Past Surgical History:  Procedure Laterality Date  . BREAST EXCISIONAL BIOPSY Left    benign  . CARPAL TUNNEL RELEASE    . CATARACT EXTRACTION Left 07/12/2013  . MANDIBLE SURGERY  1991  . MASTECTOMY Right    malignant  . NASAL SINUS SURGERY  1990/2001  . ORIF FINGER FRACTURE  02/14/2012   Procedure: OPEN REDUCTION INTERNAL FIXATION (ORIF) METACARPAL (FINGER) FRACTURE;  Surgeon: Wynonia Sours, MD;  Location: [facility];  Service: Orthopedics;  Laterality: Right;  RIGHT FIFTH   . ORIF METACARPAL FRACTURE  8/13   rt   . ORIF PATELLA Right 11/07/2018   Procedure: OPEN REDUCTION INTERNAL (ORIF) FIXATION PATELLA;  Surgeon: Nicholes Stairs, MD;  Location: [facility];  Service: Orthopedics;  Laterality: Right;  . ORIF PATELLA FRACTURE Right 11/07/2018  . PORT-A-CATH REMOVAL     in and now out  . REPAIR EXTENSOR TENDON Left 05/30/2013   Procedure: RELEASE TRANSPOSITION EXTENSOR POLLICUS LONGUS LEFT WRIST;  Surgeon: Wynonia Sours, MD;  Location: [facility];  Service: Orthopedics;  Laterality: Left;  . rt mastectomy  2004   lymph nodes-7-axillary node dissection  . SHOULDER ARTHROSCOPY WITH ROTATOR CUFF REPAIR AND SUBACROMIAL DECOMPRESSION Left 11/22/2013   Procedure: LEFT SHOULDER ARTHROSCOPY WITH SUBACROMIAL DECOMPRESSION DISTAL CLAVICLE RESECTION REPAIR  ROTATOR CUFF AS NEEDED ;  Surgeon: Cammie Sickle., MD;  Location: [facility];  Service: Orthopedics;  Laterality: Left;  . WRIST FRACTURE SURGERY      Current Outpatient Medications  Medication Sig Dispense Refill  . ALPRAZOLAM XR 3 MG 24 hr tablet Take 3 mg by mouth at bedtime.    Marland Kitchen anti-nausea (EMETROL) solution Take 15 mLs by mouth every 15 (fifteen) minutes as needed for nausea or vomiting.    Marland Kitchen BIOTIN PO Take 1 tablet by mouth daily.    Marland Kitchen bismuth subsalicylate (PEPTO BISMOL) 262 MG/15ML suspension Take 30 mLs by mouth daily as needed for indigestion.     . calcium carbonate (OS-CAL) 600  MG TABS tablet Take 1 tablet by mouth daily.    . Cholecalciferol (D2000 ULTRA STRENGTH) 50 MCG (2000 UT) CAPS Take 1 capsule by mouth daily.    . furosemide (LASIX) 20 MG tablet Take 20 mg by mouth daily.     Marland Kitchen GARLIC PO Take 1 capsule by mouth daily.    Marland Kitchen HYDROcodone-acetaminophen (NORCO) 7.5-325 MG tablet Take 1-2 tablets by mouth every 4 (four) hours as needed for severe pain (pain score 7-10). 45 tablet 0  . ibuprofen (ADVIL) 200 MG tablet Take 200 mg by mouth daily as needed for headache or moderate pain.     Marland Kitchen loperamide (IMODIUM) 1 MG/5ML solution Take 2 mg by mouth as needed for diarrhea or loose stools.    . metoprolol succinate (TOPROL-XL) 50 MG 24 hr tablet Take 50 mg by mouth at bedtime.     . Multiple Vitamin (MULTIVITAMIN WITH MINERALS) TABS tablet Take 1 tablet by mouth daily.    . Polyvinyl Alcohol-Povidone (REFRESH OP) Place 1 drop into both eyes at bedtime.    . sucralfate (CARAFATE) 1 g tablet Take 1 tablet (1 g total) by mouth 4 (four) times daily -  with meals and at bedtime. 21 tablet 0   No current facility-administered medications for this visit.     Family History  Problem Relation Age of Onset  . Heart disease Father   . Heart disease Mother   . Hypertension Mother   . Rectal cancer Mother        dx in her early 16s  . Colon polyps Mother   . Breast cancer Maternal Aunt        dx in her early 21s  . Heart disease Paternal Uncle   . Heart disease Paternal Grandmother   . Heart disease Paternal Grandfather   . Diabetes Brother   . Breast cancer Maternal Grandmother        dx in her 79s  . Breast cancer Cousin        dx in early 5s  . Breast cancer Cousin 16  . Hepatitis C Son   . Hypertension Son   . Post-traumatic stress disorder Son     Review of Systems  All other systems reviewed and are negative.   Exam:   BP (!) 148/88 (Cuff Size: Large)   Pulse 80   Temp (!) 97 F (36.1 C) (Temporal)   Ht 5\' 2"  (1.575 m)   Wt 173 lb 3.2 oz (78.6 kg)    LMP 01/10/2003   BMI 31.68 kg/m     General appearance: alert, cooperative and appears stated age Head: normocephalic, without obvious abnormality, atraumatic Neck: no adenopathy, supple, symmetrical, trachea midline and thyroid normal to inspection and palpation Lungs: clear to auscultation bilaterally Breasts: left  - normal appearance, no masses or  tenderness, No nipple retraction or dimpling, No nipple discharge or bleeding, No axillary adenopathy Right - absent. No masses of chest wall.  No axillary adenopathy.  Heart: regular rate and rhythm Abdomen: soft, non-tender; no masses, no organomegaly Extremities: extremities normal, atraumatic, no cyanosis or edema Skin: skin color, texture, turgor normal. No rashes or lesions Lymph nodes: cervical, supraclavicular, and axillary nodes normal. Neurologic: grossly normal  Pelvic: External genitalia:  no lesions              No abnormal inguinal nodes palpated.              Urethra:  normal appearing urethra with no masses, tenderness or lesions              Bartholins and Skenes: normal                 Vagina: normal appearing vagina with normal color and discharge, no lesions              Cervix: no lesions              Pap taken: No. Bimanual Exam:  Uterus:  normal size, contour, position, consistency, mobility, non-tender              Adnexa: no mass, fullness, tenderness              Bimanual exam limited by body habitus.               Rectal exam: Yes.  .  Confirms.              Anus:  normal sphincter tone, no lesions  Chaperone was present for exam.  Assessment:   Well woman visit with normal exam. Bilateral ovarian cysts.  Hx right breast cancer.  Status post right mastectomy.  Osteopenia.  Hx dementia.   Plan: Mammogram screening discussed.  I gave her [facility] on a reminder sheet from their facility so she will schedule this.  Self breast awareness reviewed. Pap and HR HPV as  above. Guidelines for Calcium, Vitamin D, regular exercise program including cardiovascular and weight bearing exercise. Declines flu vaccine today.  Return for pelvic US and potential CA125.  Follow up annually and prn.   After visit summary provided.

## 2019-04-25 ENCOUNTER — Ambulatory Visit (INDEPENDENT_AMBULATORY_CARE_PROVIDER_SITE_OTHER): Payer: PPO | Admitting: Obstetrics and Gynecology

## 2019-04-25 ENCOUNTER — Other Ambulatory Visit: Payer: Self-pay

## 2019-04-25 ENCOUNTER — Encounter: Payer: Self-pay | Admitting: Obstetrics and Gynecology

## 2019-04-25 VITALS — BP 148/88 | HR 80 | Temp 97.0°F | Ht 62.0 in | Wt 173.2 lb

## 2019-04-25 DIAGNOSIS — Z01419 Encounter for gynecological examination (general) (routine) without abnormal findings: Secondary | ICD-10-CM

## 2019-04-25 DIAGNOSIS — N83202 Unspecified ovarian cyst, left side: Secondary | ICD-10-CM | POA: Diagnosis not present

## 2019-04-25 DIAGNOSIS — N83201 Unspecified ovarian cyst, right side: Secondary | ICD-10-CM | POA: Diagnosis not present

## 2019-04-25 NOTE — Patient Instructions (Addendum)
Kylie Tucker,   We will call you to schedule your pelvic ultrasound to recheck your ovarian cysts.   Please call [facility] to schedule your [facility].   Have a good afternoon!  Kylie Half, MD   EXERCISE AND DIET:  We recommended that you start or continue a regular exercise program for good health. Regular exercise means any activity that makes your heart beat faster and makes you sweat.  We recommend exercising at least 30 minutes per day at least 3 days a week, preferably 4 or 5.  We also recommend a diet low in fat and sugar.  Inactivity, poor dietary choices and obesity can cause diabetes, heart attack, stroke, and kidney damage, among others.    ALCOHOL AND SMOKING:  Women should limit their alcohol intake to no more than 7 drinks/beers/glasses of wine (combined, not each!) per week. Moderation of alcohol intake to this level decreases your risk of breast cancer and liver damage. And of course, no recreational drugs are part of a healthy lifestyle.  And absolutely no smoking or even second hand smoke. Most people know smoking can cause heart and lung diseases, but did you know it also contributes to weakening of your bones? Aging of your skin?  Yellowing of your teeth and nails?  CALCIUM AND VITAMIN D:  Adequate intake of calcium and Vitamin D are recommended.  The recommendations for exact amounts of these supplements seem to change often, but generally speaking 600 mg of calcium (either carbonate or citrate) and 800 units of Vitamin D per day seems prudent. Certain women may benefit from higher intake of Vitamin D.  If you are among these women, your doctor will have told you during your visit.    PAP SMEARS:  Pap smears, to check for cervical cancer or precancers,  have traditionally been done yearly, although recent scientific advances have shown that most women can have pap smears less often.  However, every woman still should have a physical exam from her gynecologist every year.  It will include a breast check, inspection of the vulva and vagina to check for abnormal growths or skin changes, a visual exam of the cervix, and then an exam to evaluate the size and shape of the uterus and ovaries.  And after 68 years of age, a rectal exam is indicated to check for rectal cancers. We will also provide age appropriate advice regarding health maintenance, like when you should have certain vaccines, screening for sexually transmitted diseases, bone density testing, colonoscopy, mammograms, etc.   MAMMOGRAMS:  All women over 68 years old should have a yearly mammogram. Many facilities now offer a "3D" mammogram, which may cost around $50 extra out of pocket. If possible,  we recommend you accept the option to have the 3D mammogram performed.  It both reduces the number of women who will be called back for extra views which then turn out to be normal, and it is better than the routine mammogram at detecting truly abnormal areas.    COLONOSCOPY:  Colonoscopy to screen for colon cancer is recommended for all women at age 68.  We know, you hate the idea of the prep.  We agree, BUT, having colon cancer and not knowing it is worse!!  Colon cancer so often starts as a polyp that can be seen and removed at colonscopy, which can quite literally save your life!  And if your first colonoscopy is normal and you have no family history of colon cancer, most women  don't have to have it again for 10 years.  Once every ten years, you can do something that may end up saving your life, right?  We will be happy to help you get it scheduled when you are ready.  Be sure to check your insurance coverage so you understand how much it will cost.  It may be covered as a preventative service at no cost, but you should check your particular policy.

## 2019-04-26 ENCOUNTER — Telehealth: Payer: Self-pay | Admitting: Obstetrics and Gynecology

## 2019-04-26 NOTE — Telephone Encounter (Signed)
Call placed to patient to review benefit for recommended ultrasound. Unable to leave a voicemail message on mobile number, as there are restrictions on that number. Placed another call to home number. Left a voicemail message requesting a return call

## 2019-04-30 NOTE — Telephone Encounter (Signed)
Spoke with patients spouse, Marguerite Olea, he is listed on the most recent Environmental consultant. Darrell advised he can make the appointment for the patient. Reviewed benefit, Darrell acknowledges understanding of information presented. Patient is scheduled on 05/03/2019 with Dr Quincy Simmonds. He is aware of the appointment date, arrival time and cancellation policy. No further questions. Will close encounter

## 2019-05-03 ENCOUNTER — Other Ambulatory Visit: Payer: Self-pay

## 2019-05-03 ENCOUNTER — Ambulatory Visit (INDEPENDENT_AMBULATORY_CARE_PROVIDER_SITE_OTHER): Payer: PPO

## 2019-05-03 ENCOUNTER — Ambulatory Visit (INDEPENDENT_AMBULATORY_CARE_PROVIDER_SITE_OTHER): Payer: PPO | Admitting: Obstetrics and Gynecology

## 2019-05-03 ENCOUNTER — Encounter: Payer: Self-pay | Admitting: Obstetrics and Gynecology

## 2019-05-03 VITALS — BP 162/88 | HR 80 | Temp 97.8°F | Ht 62.0 in | Wt 176.5 lb

## 2019-05-03 DIAGNOSIS — N83201 Unspecified ovarian cyst, right side: Secondary | ICD-10-CM | POA: Diagnosis not present

## 2019-05-03 DIAGNOSIS — N83202 Unspecified ovarian cyst, left side: Secondary | ICD-10-CM

## 2019-05-03 NOTE — Progress Notes (Signed)
GYNECOLOGY  VISIT   HPI: 68 y.o.   Married  Caucasian  female   [phone] with Patient's last menstrual period was 01/10/2003.   here for pelvic ultrasound to follow up on bilateral ovarian cysts.   Patient is currently doing observational management for her ovarian cysts.  She has had normal CA125s and has seen GYN ONC.  CA125 12.2 on 05/17/18. Her current plan is for surgery if she becomes symptomatic.   She denies pain and bleeding.   A CT scan of abdomen and pelvis on 12/06/17 shows bilateral ovarian cysts which appear unchanged compared to prior US.  GYNECOLOGIC HISTORY: Patient's last menstrual period was 01/10/2003. Contraception: Postmenopausal Menopausal hormone therapy:  none Last mammogram:  06-01-17 Rt.Mast./Lt.MMG Density C/Neg/BiRads1--MRI Neg/BiRads1--patient knows she needs to schedule Last pap smear: 04-21-18 Neg, 09-26-15 [id number] HPV, 08-01-12 Neg        OB History    Gravida  3   Para  2   Term      Preterm      AB  1   Living  2     SAB  0   TAB  1   Ectopic      Multiple      Live Births                 Patient Active Problem List   Diagnosis Date Noted  . Displaced transverse fracture of right patella, initial encounter for closed fracture 11/07/2018  . Right patella fracture 11/07/2018  . Malignant neoplasm of lower-outer quadrant of right breast of female, estrogen receptor positive ([place]) 05/09/2018  . Elevated troponin 04/06/2018  . Hallucinations 04/06/2018  . Agitation 04/06/2018  . HTN (hypertension) 04/06/2018  . Bilateral ovarian cysts 05/20/2017  . Hyponatremia 11/28/2016  . N&V (nausea and vomiting) 11/28/2016  . Abdominal pain 11/28/2016  . Sleep talking 07/22/2016  . Genetic testing 06/01/2016  . Family history of breast cancer   . Family history of rectal cancer   . Chest pain 09/23/2015  . Abnormal vision 06/13/2015  . Anxiety disorder 06/13/2015  . Cataract cortical, senile 06/13/2015  . Clinical depression  06/13/2015  . Cephalalgia 06/13/2015  . H/O malignant neoplasm of breast 06/13/2015  . 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 Sinus infection 06/13/2015  . Long term current use of systemic steroids 06/13/2015  . Chronic systolic CHF (congestive heart failure), NYHA class 1 ([place]) 05/09/2015  . LBBB (left bundle branch block) 03/26/2015  . Lymphedema of arm 12/13/2013  . Complete rupture of rotator cuff 11/22/2013  . Nipple discharge in female 12/01/2012  . History of breast cancer 11/30/2012  . Epiphora 11/28/2012  . Disorder of endocrine system 04/25/2012  . Ceratitis 04/25/2012  . Blepharitis 04/25/2012  . Breast cancer, right breast ([place])   . FATIGUE 10/01/2009  . 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 Dyspnea 10/01/2009  . HYPERLIPIDEMIA 09/30/2009    Past Medical History:  Diagnosis Date  . Allergy   . Anxiety   . Bilateral ovarian cysts    Do yearly ultrasound.  . Cancer ([place]) rt breast   2004/ chemo/ radiation  . Cellulitis and abscess of upper arm and forearm   . Chronic combined systolic and diastolic CHF (congestive heart failure) ([place])   . Dementia ([place])   . Family history of breast cancer   . Family history of rectal cancer   . GERD (gastroesophageal reflux disease)   . H/O Helicobacter infection 2017  . History of right mastectomy 2004  chemo/ radiation  . Hyperlipidemia   . Hypertension   . Insomnia   . Left bundle branch block 2016  . Migraines   . Moderate mitral regurgitation   . NICM (nonischemic cardiomyopathy) ([place])   . Pneumonia   . PONV (postoperative nausea and vomiting)    cannot take zofran  . Postmastectomy lymphedema rt side  . Sentinel node     Past Surgical History:  Procedure Laterality Date  . BREAST EXCISIONAL BIOPSY Left    benign  . CARPAL TUNNEL RELEASE    . CATARACT EXTRACTION Left 07/12/2013  . MANDIBLE SURGERY  1991  . MASTECTOMY Right    malignant  . NASAL SINUS SURGERY  1990/2001  . ORIF FINGER FRACTURE  02/14/2012   Procedure: OPEN  REDUCTION INTERNAL FIXATION (ORIF) METACARPAL (FINGER) FRACTURE;  Surgeon: Wynonia Sours, MD;  Location: [facility];  Service: Orthopedics;  Laterality: Right;  RIGHT FIFTH   . ORIF METACARPAL FRACTURE  8/13   rt   . ORIF PATELLA Right 11/07/2018   Procedure: OPEN REDUCTION INTERNAL (ORIF) FIXATION PATELLA;  Surgeon: Nicholes Stairs, MD;  Location: [facility];  Service: Orthopedics;  Laterality: Right;  . ORIF PATELLA FRACTURE Right 11/07/2018  . PORT-A-CATH REMOVAL     in and now out  . REPAIR EXTENSOR TENDON Left 05/30/2013   Procedure: RELEASE TRANSPOSITION EXTENSOR POLLICUS LONGUS LEFT WRIST;  Surgeon: Wynonia Sours, MD;  Location: [facility];  Service: Orthopedics;  Laterality: Left;  . rt mastectomy  2004   lymph nodes-7-axillary node dissection  . SHOULDER ARTHROSCOPY WITH ROTATOR CUFF REPAIR AND SUBACROMIAL DECOMPRESSION Left 11/22/2013   Procedure: LEFT SHOULDER ARTHROSCOPY WITH SUBACROMIAL DECOMPRESSION DISTAL CLAVICLE RESECTION REPAIR  ROTATOR CUFF AS NEEDED ;  Surgeon: Cammie Sickle., MD;  Location: [facility];  Service: Orthopedics;  Laterality: Left;  . WRIST FRACTURE SURGERY      Current Outpatient Medications  Medication Sig Dispense Refill  . ALPRAZOLAM XR 3 MG 24 hr tablet Take 3 mg by mouth at bedtime.    Marland Kitchen anti-nausea (EMETROL) solution Take 15 mLs by mouth every 15 (fifteen) minutes as needed for nausea or vomiting.    Marland Kitchen BIOTIN PO Take 1 tablet by mouth daily.    Marland Kitchen bismuth subsalicylate (PEPTO BISMOL) 262 MG/15ML suspension Take 30 mLs by mouth daily as needed for indigestion.     . calcium carbonate (OS-CAL) 600 MG TABS tablet Take 1 tablet by mouth daily.    . Cholecalciferol (D2000 ULTRA STRENGTH) 50 MCG (2000 UT) CAPS Take 1 capsule by mouth daily.    . diazepam (VALIUM) 10 MG tablet Take 1 tablet by mouth as needed.    . furosemide (LASIX) 20 MG tablet Take 20 mg by mouth daily.     Marland Kitchen GARLIC PO Take 1 capsule by mouth  daily.    Marland Kitchen HYDROcodone-acetaminophen (NORCO) 7.5-325 MG tablet Take 1-2 tablets by mouth every 4 (four) hours as needed for severe pain (pain score 7-10). 45 tablet 0  . ibuprofen (ADVIL) 200 MG tablet Take 200 mg by mouth daily as needed for headache or moderate pain.     Marland Kitchen loperamide (IMODIUM) 1 MG/5ML solution Take 2 mg by mouth as needed for diarrhea or loose stools.    . metoprolol succinate (TOPROL-XL) 50 MG 24 hr tablet Take 50 mg by mouth at bedtime.     . Multiple Vitamin (MULTIVITAMIN WITH MINERALS) TABS tablet Take 1 tablet by mouth daily.    Marland Kitchen  omeprazole (PRILOSEC) 40 MG capsule Take 40 mg by mouth daily.    . Polyvinyl Alcohol-Povidone (REFRESH OP) Place 1 drop into both eyes at bedtime.    . sucralfate (CARAFATE) 1 g tablet Take 1 tablet (1 g total) by mouth 4 (four) times daily -  with meals and at bedtime. 21 tablet 0  . triazolam (HALCION) 0.25 MG tablet Take 0.25 mg by mouth at bedtime as needed. Patient states she is not taking this medication     No current facility-administered medications for this visit.      ALLERGIES: Clonazepam, Hydrocodone-acetaminophen, Rosuvastatin, Trazodone and nefazodone, Acetaminophen, Amoxicillin-pot clavulanate, Cefuroxime axetil, Cephalexin, Levofloxacin, Morphine and related, Ondansetron, Propoxyphene n-acetaminophen, Sulfonamide derivatives, Vioxx [rofecoxib], Aspirin, Azithromycin, Clarithromycin, Codeine, Milk-related compounds, Seroquel [quetiapine fumarate], Sulfamethoxazole, and Tramadol  Family History  Problem Relation Age of Onset  . Heart disease Father   . Heart disease Mother   . Hypertension Mother   . Rectal cancer Mother        dx in her early 39s  . Colon polyps Mother   . Breast cancer Maternal Aunt        dx in her early 50s  . Heart disease Paternal Uncle   . Heart disease Paternal Grandmother   . Heart disease Paternal Grandfather   . Diabetes Brother   . Breast cancer Maternal Grandmother        dx in her 11s   . Breast cancer Cousin        dx in early 61s  . Breast cancer Cousin 19  . Hepatitis C Son   . Hypertension Son   . Post-traumatic stress disorder Son     Social History   Socioeconomic History  . Marital status: Married    Spouse name: Not on file  . Number of children: Not on file  . Years of education: Not on file  . Highest education level: Not on file  Occupational History  . Occupation: retired  Scientific laboratory technician  . Financial resource strain: Patient refused  . Food insecurity    Worry: Patient refused    Inability: Patient refused  . Transportation needs    Medical: Patient refused    Non-medical: Patient refused  Tobacco Use  . Smoking status: Never Smoker  . Smokeless tobacco: Never Used  Substance and Sexual Activity  . Alcohol use: No    Alcohol/week: 0.0 standard drinks    Comment: rarely  . Drug use: No  . Sexual activity: Not Currently    Partners: Male    Birth control/protection: Post-menopausal  Lifestyle  . Physical activity    Days per week: Patient refused    Minutes per session: Patient refused  . Stress: Patient refused  Relationships  . Social Herbalist on phone: Patient refused    Gets together: Patient refused    Attends religious service: Patient refused    Active member of club or organization: Patient refused    Attends meetings of clubs or organizations: Patient refused    Relationship status: Patient refused  . Intimate partner violence    Fear of current or ex partner: Patient refused    Emotionally abused: Patient refused    Physically abused: Patient refused    Forced sexual activity: Patient refused  Other Topics Concern  . Not on file  Social History Narrative  . Not on file    Review of Systems  All other systems reviewed and are negative.   PHYSICAL EXAMINATION:  BP (!) 162/88 (Cuff Size: Large)   Pulse 80   Temp 97.8 F (36.6 C) (Temporal)   Ht 5\' 2"  (1.575 m)   Wt 176 lb 8 oz (80.1 kg)   LMP  01/10/2003   BMI 32.28 kg/m     General appearance: alert, cooperative and appears stated age   Pelvic US Uterus normal.  EMS 1.32 mm.  Right ovary 55 x 48 x 45 mm cyst with septations.  Slight increase in size.  Left ovary with 44 x 40 x 26 mm cyst with septations.   Slight decrease in size. No adnexal masses.  No free fluid.   ASSESSMENT  Bilateral ovarian cysts.  Probable benign cystadenomas. Normal CA125 levels.   PLAN  Discussion regarding ovarian cysts which are likely benign.  Patient prefers to do observational management.  Will check CA125 today.  If CA126 becomes abnormal, to GYN ONC. Plan for yearly [place].    An After Visit Summary was printed and given to the patient.  __15____ minutes face to face time of which over 50% was spent in counseling.

## 2019-05-03 NOTE — Patient Instructions (Signed)
Hello Ms. Kylie Tucker,   You still have a cyst on each ovary.  Your right ovarian cyst increased in size by a few millimeters.  Your left ovarian cyst decreased in size by a few millimeters.  Your uterus looked normal.   We believe that these cysts are benign in nature.   Today we are check a CA125 to see if there is any increased risk of cancer of the cysts.   You have opted to not do surgery at this time.   Please call me if you develop any vaginal bleeding or lower abdominal/pelvic pain.   Our current plan will be to do yearly pelvic ultrasound to recheck them.   Thank you,   [name], MD

## 2019-05-04 LAB — CA 125: Cancer Antigen (CA) 125: 27.7 U/mL (ref 0.0–38.1)

## 2019-05-10 ENCOUNTER — Other Ambulatory Visit: Payer: PPO

## 2019-05-10 ENCOUNTER — Ambulatory Visit: Payer: PPO | Admitting: Oncology

## 2019-05-24 ENCOUNTER — Other Ambulatory Visit: Payer: Self-pay

## 2019-05-24 DIAGNOSIS — C50911 Malignant neoplasm of unspecified site of right female breast: Secondary | ICD-10-CM

## 2019-05-24 DIAGNOSIS — C50511 Malignant neoplasm of lower-outer quadrant of right female breast: Secondary | ICD-10-CM
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
## 2019-05-25 ENCOUNTER — Inpatient Hospital Stay: Payer: PPO | Attending: Oncology

## 2019-05-25 ENCOUNTER — Inpatient Hospital Stay (HOSPITAL_BASED_OUTPATIENT_CLINIC_OR_DEPARTMENT_OTHER): Payer: PPO | Admitting: Oncology

## 2019-05-25 ENCOUNTER — Other Ambulatory Visit: Payer: Self-pay

## 2019-05-25 VITALS — BP 152/99 | HR 80 | Temp 98.0°F | Resp 18 | Ht 62.0 in | Wt 175.5 lb

## 2019-05-25 DIAGNOSIS — F0390 Unspecified dementia without behavioral disturbance: Secondary | ICD-10-CM | POA: Insufficient documentation

## 2019-05-25 DIAGNOSIS — Z79899 Other long term (current) drug therapy: Secondary | ICD-10-CM | POA: Insufficient documentation

## 2019-05-25 DIAGNOSIS — Z923 Personal history of irradiation: Secondary | ICD-10-CM | POA: Diagnosis not present

## 2019-05-25 DIAGNOSIS — Z170 Estrogen receptor positive status [ER+]: Secondary | ICD-10-CM | POA: Diagnosis not present

## 2019-05-25 DIAGNOSIS — Z803 Family history of malignant neoplasm of breast: Secondary | ICD-10-CM | POA: Insufficient documentation

## 2019-05-25 DIAGNOSIS — Z8249 Family history of ischemic heart disease and other diseases of the circulatory system: Secondary | ICD-10-CM | POA: Diagnosis not present

## 2019-05-25 DIAGNOSIS — F419 Anxiety disorder, unspecified: Secondary | ICD-10-CM | POA: Insufficient documentation

## 2019-05-25 DIAGNOSIS — Z791 Long term (current) use of non-steroidal anti-inflammatories (NSAID): Secondary | ICD-10-CM | POA: Insufficient documentation

## 2019-05-25 DIAGNOSIS — C50511 Malignant neoplasm of lower-outer quadrant of right female breast: Secondary | ICD-10-CM

## 2019-05-25 DIAGNOSIS — Z853 Personal history of malignant neoplasm of breast: Secondary | ICD-10-CM | POA: Insufficient documentation

## 2019-05-25 DIAGNOSIS — Z800 Family history of malignant neoplasm of digestive organs: Secondary | ICD-10-CM | POA: Diagnosis not present

## 2019-05-25 DIAGNOSIS — Z9011 Acquired absence of right breast and nipple: Secondary | ICD-10-CM | POA: Diagnosis not present

## 2019-05-25 DIAGNOSIS — Z9221 Personal history of antineoplastic chemotherapy: Secondary | ICD-10-CM | POA: Diagnosis not present

## 2019-05-25 DIAGNOSIS — C50911 Malignant neoplasm of unspecified site of right female breast: Secondary | ICD-10-CM

## 2019-05-25 LAB — CMP (CANCER CENTER ONLY)
ALT: 40 U/L (ref 0–44)
AST: 25 U/L (ref 15–41)
Albumin: 3.5 g/dL (ref 3.5–5.0)
Alkaline Phosphatase: 101 U/L (ref 38–126)
Anion gap: 11 (ref 5–15)
BUN: 20 mg/dL (ref 8–23)
CO2: 28 mmol/L (ref 22–32)
Calcium: 8.7 mg/dL — ABNORMAL LOW (ref 8.9–10.3)
Chloride: 105 mmol/L (ref 98–111)
Creatinine: 1.1 mg/dL — ABNORMAL HIGH (ref 0.44–1.00)
GFR, Est AFR Am: 60 mL/min — ABNORMAL LOW (ref >60)
GFR, Estimated: 52 mL/min — ABNORMAL LOW (ref >60)
Glucose, Bld: 126 mg/dL — ABNORMAL HIGH (ref 70–99)
Potassium: 4 mmol/L (ref 3.5–5.1)
Sodium: 144 mmol/L (ref 135–145)
Total Bilirubin: 0.3 mg/dL (ref 0.3–1.2)
Total Protein: 6.5 g/dL (ref 6.5–8.1)

## 2019-05-25 LAB — CBC WITH DIFFERENTIAL (CANCER CENTER ONLY)
Abs Immature Granulocytes: 0.02 10*3/uL (ref 0.00–0.07)
Basophils Absolute: 0.1 10*3/uL (ref 0.0–0.1)
Basophils Relative: 1 %
Eosinophils Absolute: 0.3 10*3/uL (ref 0.0–0.5)
Eosinophils Relative: 4 %
HCT: 43.6 % (ref 36.0–46.0)
Hemoglobin: 14.1 g/dL (ref 12.0–15.0)
Immature Granulocytes: 0 %
Lymphocytes Relative: 33 %
Lymphs Abs: 2.3 10*3/uL (ref 0.7–4.0)
MCH: 28.9 pg (ref 26.0–34.0)
MCHC: 32.3 g/dL (ref 30.0–36.0)
MCV: 89.3 fL (ref 80.0–100.0)
Monocytes Absolute: 0.6 10*3/uL (ref 0.1–1.0)
Monocytes Relative: 9 %
Neutro Abs: 3.8 10*3/uL (ref 1.7–7.7)
Neutrophils Relative %: 53 %
Platelet Count: 254 10*3/uL (ref 150–400)
RBC: 4.88 MIL/uL (ref 3.87–5.11)
RDW: 14.3 % (ref 11.5–15.5)
WBC Count: 7.1 10*3/uL (ref 4.0–10.5)
nRBC: 0 % (ref 0.0–0.2)

## 2019-06-21 ENCOUNTER — Ambulatory Visit: Payer: PPO

## 2019-07-13 ENCOUNTER — Ambulatory Visit
Admission: EM | Admit: 2019-07-13 | Discharge: 2019-07-13 | Disposition: A | Discharge status: Home / Self Care | Payer: PPO | Attending: Emergency Medicine | Admitting: Emergency Medicine

## 2019-07-13 ENCOUNTER — Ambulatory Visit (INDEPENDENT_AMBULATORY_CARE_PROVIDER_SITE_OTHER): Payer: PPO

## 2019-07-13 ENCOUNTER — Other Ambulatory Visit: Payer: Self-pay

## 2019-07-13 DIAGNOSIS — R0602 Shortness of breath: Secondary | ICD-10-CM | POA: Diagnosis not present

## 2019-07-13 DIAGNOSIS — R05 Cough: Secondary | ICD-10-CM

## 2019-07-13 DIAGNOSIS — J189 Pneumonia, unspecified organism: Secondary | ICD-10-CM

## 2019-07-13 DIAGNOSIS — R062 Wheezing: Secondary | ICD-10-CM | POA: Diagnosis not present

## 2019-07-13 MED ORDER — AZITHROMYCIN 250 MG PO TABS: 250.0000 mg | ORAL_TABLET | Freq: Every day | ORAL | 0 refills | Status: DC

## 2019-07-13 MED ORDER — BENZONATATE 100 MG PO CAPS: 100.0000 mg | ORAL_CAPSULE | Freq: Three times a day (TID) | ORAL | 0 refills | Status: DC

## 2019-07-13 MED ORDER — ALBUTEROL SULFATE HFA 108 (90 BASE) MCG/ACT IN AERS: 1.0000 | INHALATION_SPRAY | Freq: Four times a day (QID) | RESPIRATORY_TRACT | 0 refills | Status: DC | PRN

## 2019-07-13 NOTE — Discharge Instructions (Addendum)
X-rays showed a pneumonia Get plenty of rest and push fluids Use OTC medications as needed for symptomatic relief of fever and body aches Augmentin prescribed.  Take as prescribed and to completion.   Take antibiotic as directed and to completion Follow up with PCP if symptoms persist Return or go to ER if you have any new or worsening symptoms   Recommend repeat x-ray in 6 weeks to exclude underlying malignancy

## 2019-07-13 NOTE — ED Triage Notes (Signed)
Pt presents to UC w/ c/o cough, chest tightness,sob, runny nose and wheezing x 3 weeks.

## 2019-07-13 NOTE — ED Provider Notes (Signed)
[facility]    CSN: [id number] Arrival date & time: 07/13/19  1558      History   Chief Complaint Chief Complaint  Patient presents with  . Cough  . chest tightness    HPI Kylie Tucker is a 69 y.o. female.   Kylie Tucker 69 years old female presented to the urgent care with a complaint of cough, chest tightness, shortness of breath, runny nose, and wheezing for the past 3 weeks. Use OTC medication with no relief.   Denies sick exposure to COVID, flu or strep.  Denies recent travel.  Denies aggravating or alleviating symptoms.  Denies previous COVID infection.   Denies fever, chills, fatigue, nasal congestion, rhinorrhea, sore throat,  wheezing, chest pain, nausea, vomiting, changes in bowel or bladder habits.    The history is provided by the patient. A language interpreter was used.  Cough Associated symptoms: rhinorrhea, shortness of breath and wheezing     Past Medical History:  Diagnosis Date  . Allergy   . Anxiety   . Bilateral ovarian cysts    Do yearly ultrasound.  . Cancer ([place]) rt breast   2004/ chemo/ radiation  . Cellulitis and abscess of upper arm and forearm   . Chronic combined systolic and diastolic CHF (congestive heart failure) ([place])   . Dementia ([place])   . Family history of breast cancer   . Family history of rectal cancer   . GERD (gastroesophageal reflux disease)   . H/O Helicobacter infection 2017  . History of right mastectomy 2004   chemo/ radiation  . Hyperlipidemia   . Hypertension   . Insomnia   . Left bundle branch block 2016  . Migraines   . Moderate mitral regurgitation   . NICM (nonischemic cardiomyopathy) ([place])   . Pneumonia   . PONV (postoperative nausea and vomiting)    cannot take zofran  . Postmastectomy lymphedema rt side  . [place] node     Patient Active Problem List   Diagnosis Date Noted  . Displaced transverse fracture of right patella, initial encounter for closed fracture 11/07/2018  . Right  patella fracture 11/07/2018  . Malignant neoplasm of lower-outer quadrant of right breast of female, estrogen receptor positive ([place]) 05/09/2018  . Elevated troponin 04/06/2018  . Hallucinations 04/06/2018  . Agitation 04/06/2018  . HTN (hypertension) 04/06/2018  . Bilateral ovarian cysts 05/20/2017  . Hyponatremia 11/28/2016  . N&V (nausea and vomiting) 11/28/2016  . Abdominal pain 11/28/2016  . Sleep talking 07/22/2016  . Genetic testing 06/01/2016  . Family history of breast cancer   . Family history of rectal cancer   . Chest pain 09/23/2015  . Abnormal vision 06/13/2015  . Anxiety disorder 06/13/2015  . Cataract cortical, senile 06/13/2015  . Clinical depression 06/13/2015  . Cephalalgia 06/13/2015  . H/O malignant neoplasm of breast 06/13/2015  . 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 Sinus infection 06/13/2015  . Long term current use of systemic steroids 06/13/2015  . Chronic systolic CHF (congestive heart failure), NYHA class 1 ([place]) 05/09/2015  . LBBB (left bundle branch block) 03/26/2015  . Lymphedema of arm 12/13/2013  . Complete rupture of rotator cuff 11/22/2013  . Nipple discharge in female 12/01/2012  . History of breast cancer 11/30/2012  . Epiphora 11/28/2012  . Disorder of endocrine system 04/25/2012  . Ceratitis 04/25/2012  . Blepharitis 04/25/2012  . Breast cancer, right breast ([place])   . FATIGUE 10/01/2009  . 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 Dyspnea 10/01/2009  . HYPERLIPIDEMIA  09/30/2009    Past Surgical History:  Procedure Laterality Date  . BREAST EXCISIONAL BIOPSY Left    benign  . CARPAL TUNNEL RELEASE    . CATARACT EXTRACTION Left 07/12/2013  . MANDIBLE SURGERY  1991  . MASTECTOMY Right    malignant  . NASAL SINUS SURGERY  1990/2001  . ORIF FINGER FRACTURE  02/14/2012   Procedure: OPEN REDUCTION INTERNAL FIXATION (ORIF) METACARPAL (FINGER) FRACTURE;  Surgeon: Wynonia Sours, MD;  Location: [place];  Service: Orthopedics;  Laterality: Right;   RIGHT FIFTH   . ORIF METACARPAL FRACTURE  8/13   rt   . ORIF PATELLA Right 11/07/2018   Procedure: OPEN REDUCTION INTERNAL (ORIF) FIXATION PATELLA;  Surgeon: Nicholes Stairs, MD;  Location: [place];  Service: Orthopedics;  Laterality: Right;  . ORIF PATELLA FRACTURE Right 11/07/2018  . PORT-A-CATH REMOVAL     in and now out  . REPAIR EXTENSOR TENDON Left 05/30/2013   Procedure: RELEASE TRANSPOSITION EXTENSOR POLLICUS LONGUS LEFT WRIST;  Surgeon: Wynonia Sours, MD;  Location: [place];  Service: Orthopedics;  Laterality: Left;  . rt mastectomy  2004   lymph nodes-7-axillary node dissection  . SHOULDER ARTHROSCOPY WITH ROTATOR CUFF REPAIR AND SUBACROMIAL DECOMPRESSION Left 11/22/2013   Procedure: LEFT SHOULDER ARTHROSCOPY WITH SUBACROMIAL DECOMPRESSION DISTAL CLAVICLE RESECTION REPAIR  ROTATOR CUFF AS NEEDED ;  Surgeon: Cammie Sickle., MD;  Location: [place];  Service: Orthopedics;  Laterality: Left;  . WRIST FRACTURE SURGERY      OB History    Gravida  3   Para  2   Term      Preterm      AB  1   Living  2     SAB  0   TAB  1   Ectopic      Multiple      Live Births               Home Medications    Prior to Admission medications   Medication Sig Start Date End Date Taking? Authorizing Provider  ALPRAZOLAM XR 3 MG 24 hr tablet Take 3 mg by mouth at bedtime. 06/30/16   [provider]  anti-nausea (EMETROL) solution Take 15 mLs by mouth every 15 (fifteen) minutes as needed for nausea or vomiting.    [provider]  BIOTIN PO Take 1 tablet by mouth daily.    [provider]  bismuth subsalicylate (PEPTO BISMOL) 262 MG/15ML suspension Take 30 mLs by mouth daily as needed for indigestion.     [provider]  calcium carbonate (OS-CAL) 600 MG TABS tablet Take 1 tablet by mouth daily.    [provider]  Cholecalciferol (D2000 ULTRA STRENGTH) 50 MCG (2000 UT) CAPS Take 1 capsule  by mouth daily.    [provider]  diazepam (VALIUM) 10 MG tablet Take 1 tablet by mouth as needed. 04/13/19   [provider]  furosemide (LASIX) 20 MG tablet Take 20 mg by mouth daily.     [provider]  GARLIC PO Take 1 capsule by mouth daily.    [provider]  HYDROcodone-acetaminophen (NORCO) 7.5-325 MG tablet Take 1-2 tablets by mouth every 4 (four) hours as needed for severe pain (pain score 7-10). 11/08/18   Nicholes Stairs, MD  ibuprofen (ADVIL) 200 MG tablet Take 200 mg by mouth daily as needed for headache or moderate pain.     [provider]  loperamide (IMODIUM) 1 MG/5ML solution Take 2 mg by mouth as needed for diarrhea or loose stools.    [provider]  metoprolol succinate (TOPROL-XL) 50 MG 24 hr tablet Take 50 mg by mouth at bedtime.  08/23/16   [provider]  Multiple Vitamin (MULTIVITAMIN WITH MINERALS) TABS tablet Take 1 tablet by mouth daily.    [provider]  omeprazole (PRILOSEC) 40 MG capsule Take 40 mg by mouth daily. 12/22/18   [provider]  Polyvinyl Alcohol-Povidone (REFRESH OP) Place 1 drop into both eyes at bedtime.    [provider]  sucralfate (CARAFATE) 1 g tablet Take 1 tablet (1 g total) by mouth 4 (four) times daily -  with meals and at bedtime. 12/06/17   Carmin Muskrat, MD  triazolam (HALCION) 0.25 MG tablet Take 0.25 mg by mouth at bedtime as needed. Patient states she is not taking this medication 05/02/19   [provider]    Family History Family History  Problem Relation Age of Onset  . Heart disease Father   . Heart disease Mother   . Hypertension Mother   . Rectal cancer Mother        dx in her early 43s  . Colon polyps Mother   . Breast cancer Maternal Aunt        dx in her early 42s  . Heart disease Paternal Uncle   . Heart disease Paternal Grandmother   . Heart disease Paternal Grandfather   . Diabetes Brother   . Breast  cancer Maternal Grandmother        dx in her 3s  . Breast cancer Cousin        dx in early 24s  . Breast cancer Cousin 42  . Hepatitis C Son   . Hypertension Son   . Post-traumatic stress disorder Son     Social History Social History   Tobacco Use  . Smoking status: Never Smoker  . Smokeless tobacco: Never Used  Substance Use Topics  . Alcohol use: No    Alcohol/week: 0.0 standard drinks    Comment: rarely  . Drug use: No     Allergies   Clonazepam, Hydrocodone-acetaminophen, Rosuvastatin, Trazodone and nefazodone, Acetaminophen, Amoxicillin-pot clavulanate, Cefuroxime axetil, Cephalexin, Levofloxacin, Morphine and related, Ondansetron, Propoxyphene n-acetaminophen, Sulfonamide derivatives, Vioxx [rofecoxib], Aspirin, Azithromycin, Clarithromycin, Codeine, Milk-related compounds, Seroquel [quetiapine fumarate], Sulfamethoxazole, and Tramadol   Review of Systems Review of Systems  Constitutional: Negative.   HENT: Positive for rhinorrhea.   Respiratory: Positive for cough, chest tightness, shortness of breath and wheezing.   Cardiovascular: Negative.   Gastrointestinal: Negative.   Neurological: Negative.      Physical Exam Triage Vital Signs ED Triage Vitals  Enc Vitals Group     BP 07/13/19 1640 (!) 184/89     Pulse Rate 07/13/19 1640 81     Resp 07/13/19 1640 16     Temp 07/13/19 1640 98.4 F (36.9 C)     Temp Source 07/13/19 1640 Oral     SpO2 07/13/19 1640 98 %     Weight --      Height --      Head Circumference --      Peak Flow --      Pain Score 07/13/19 1650 0     Pain Loc --      Pain Edu? --      Excl. in [place]? --    No data found.  Updated Vital Signs BP (!) 184/89 (BP  Location: Left Arm)   Pulse 81   Temp 98.4 F (36.9 C) (Oral)   Resp 16   LMP 01/10/2003   SpO2 98%   Visual Acuity Right Eye Distance:   Left Eye Distance:   Bilateral Distance:    Right Eye Near:   Left Eye Near:    Bilateral Near:     Physical Exam Vitals  and nursing note reviewed.  Constitutional:      General: She is not in acute distress.    Appearance: Normal appearance. She is normal weight. She is not ill-appearing or toxic-appearing.  HENT:     Head: Normocephalic.     Right Ear: Tympanic membrane, ear canal and external ear normal. There is no impacted cerumen.     Left Ear: Tympanic membrane, ear canal and external ear normal. There is no impacted cerumen.     Nose: Nose normal. No congestion.     Mouth/Throat:     Mouth: Mucous membranes are moist.     Pharynx: No oropharyngeal exudate or posterior oropharyngeal erythema.  Cardiovascular:     Rate and Rhythm: Normal rate and regular rhythm.     Pulses: Normal pulses.     Heart sounds: Normal heart sounds. No murmur.  Pulmonary:     Effort: Pulmonary effort is normal. No respiratory distress.     Breath sounds: No wheezing or rhonchi.  Chest:     Chest wall: No tenderness.  Abdominal:     General: Abdomen is flat. Bowel sounds are normal. There is no distension.     Palpations: There is no mass.  Skin:    Capillary Refill: Capillary refill takes less than 2 seconds.  Neurological:     Mental Status: She is alert and oriented to person, place, and time.      UC Treatments / Results  Labs (all labs ordered are listed, but only abnormal results are displayed) Labs Reviewed - No data to display  EKG   Radiology DG Chest 2 View  Result Date: 07/13/2019 CLINICAL DATA:  Patient c/o cough, sob, wheezing, congestion x 3 weeks. Hx of htn, rt side breast cancer w/ mastectomy, pneumonia. Non smoker EXAM: CHEST - 2 VIEW COMPARISON:  Chest radiograph 04/05/2018 FINDINGS: Stable cardiomediastinal contours with mildly enlarged heart size. Central venous congestion. There are new heterogeneous opacities at the right greater than left lung bases and small volume right pleural fluid. Linear opacities in the lingula likely represents scarring. There is scattered atelectasis. No  pneumothorax. No acute finding in the visualized skeleton. IMPRESSION: New heterogeneous opacities at the right greater than left lung bases could represent atelectasis, infiltrate not excluded. Scattered atelectasis. Small volume right pleural effusion. Electronically Signed   By: Audie Pinto M.D.   On: 07/13/2019 17:37    Procedures Procedures (including critical care time)  Medications Ordered in UC Medications - No data to display  Initial Impression / Assessment and Plan / UC Course  I have reviewed the triage vital signs and the nursing notes.  Pertinent labs & imaging results that were available during my care of the patient were reviewed by me and considered in my medical decision making (see chart for details).   Chest x-ray was ordered and result was reviewed. Result show possible possible infiltrate and atelectasis, right greater than left.  Will treat patient for pneumonia. Patient stable and in no acute distress.  Antibiotics as prescribed and patient was advised to complete medication course.  To go to ED for worsening  of symptoms.  Patient verbalized understanding of the plan of care.  Final Clinical Impressions(s) / UC Diagnoses   Final diagnoses:  Community acquired pneumonia, unspecified laterality     Discharge Instructions     X-rays showed a pneumonia Get plenty of rest and push fluids Use OTC medications as needed for symptomatic relief of fever and body aches Augmentin prescribed.  Take as prescribed and to completion.   Take antibiotic as directed and to completion Follow up with PCP if symptoms persist Return or go to ER if you have any new or worsening symptoms   Recommend repeat x-ray in 6 weeks to exclude underlying malignancy     ED Prescriptions    None     PDMP not reviewed this encounter.   Emerson Monte, [place] 07/13/19 1807

## 2019-07-20 DIAGNOSIS — Z681 Body mass index (BMI) 19 or less, adult: Secondary | ICD-10-CM | POA: Diagnosis not present

## 2019-07-20 DIAGNOSIS — C50119 Malignant neoplasm of central portion of unspecified female breast: Secondary | ICD-10-CM | POA: Diagnosis not present

## 2019-07-20 DIAGNOSIS — C50511 Malignant neoplasm of lower-outer quadrant of right female breast: Secondary | ICD-10-CM | POA: Diagnosis not present

## 2019-07-20 DIAGNOSIS — J189 Pneumonia, unspecified organism: Secondary | ICD-10-CM | POA: Diagnosis not present

## 2019-07-20 DIAGNOSIS — R05 Cough: Secondary | ICD-10-CM | POA: Diagnosis not present

## 2019-07-20 DIAGNOSIS — I10 Essential (primary) hypertension: Secondary | ICD-10-CM | POA: Diagnosis not present

## 2019-07-26 ENCOUNTER — Emergency Department (HOSPITAL_COMMUNITY): Payer: PPO

## 2019-07-26 ENCOUNTER — Inpatient Hospital Stay (HOSPITAL_COMMUNITY)
Admission: EM | Admit: 2019-07-26 | Discharge: 2019-08-01 | DRG: 023 | Disposition: A | Discharge status: Skilled Nursing Facility | Payer: PPO | Attending: Neurology | Admitting: Neurology

## 2019-07-26 ENCOUNTER — Emergency Department (HOSPITAL_COMMUNITY): Payer: PPO | Admitting: Certified Registered Nurse Anesthetist

## 2019-07-26 ENCOUNTER — Inpatient Hospital Stay (HOSPITAL_COMMUNITY): Payer: PPO

## 2019-07-26 ENCOUNTER — Encounter (HOSPITAL_COMMUNITY): Payer: Self-pay

## 2019-07-26 ENCOUNTER — Other Ambulatory Visit: Payer: Self-pay

## 2019-07-26 ENCOUNTER — Encounter (HOSPITAL_COMMUNITY)
Admission: EM | Disposition: A | Discharge status: Skilled Nursing Facility | Payer: Self-pay | Source: Home / Self Care | Attending: Neurology

## 2019-07-26 DIAGNOSIS — J189 Pneumonia, unspecified organism: Secondary | ICD-10-CM | POA: Diagnosis not present

## 2019-07-26 DIAGNOSIS — Z880 Allergy status to penicillin: Secondary | ICD-10-CM

## 2019-07-26 DIAGNOSIS — Z9221 Personal history of antineoplastic chemotherapy: Secondary | ICD-10-CM | POA: Diagnosis not present

## 2019-07-26 DIAGNOSIS — Z853 Personal history of malignant neoplasm of breast: Secondary | ICD-10-CM | POA: Diagnosis not present

## 2019-07-26 DIAGNOSIS — Z170 Estrogen receptor positive status [ER+]: Secondary | ICD-10-CM

## 2019-07-26 DIAGNOSIS — Z48812 Encounter for surgical aftercare following surgery on the circulatory system: Secondary | ICD-10-CM | POA: Diagnosis not present

## 2019-07-26 DIAGNOSIS — I639 Cerebral infarction, unspecified: Secondary | ICD-10-CM

## 2019-07-26 DIAGNOSIS — R41841 Cognitive communication deficit: Secondary | ICD-10-CM | POA: Diagnosis not present

## 2019-07-26 DIAGNOSIS — R29818 Other symptoms and signs involving the nervous system: Secondary | ICD-10-CM | POA: Diagnosis not present

## 2019-07-26 DIAGNOSIS — I110 Hypertensive heart disease with heart failure: Secondary | ICD-10-CM | POA: Diagnosis not present

## 2019-07-26 DIAGNOSIS — Z833 Family history of diabetes mellitus: Secondary | ICD-10-CM | POA: Diagnosis not present

## 2019-07-26 DIAGNOSIS — N179 Acute kidney failure, unspecified: Secondary | ICD-10-CM | POA: Diagnosis not present

## 2019-07-26 DIAGNOSIS — R0689 Other abnormalities of breathing: Secondary | ICD-10-CM | POA: Diagnosis not present

## 2019-07-26 DIAGNOSIS — Z20822 Contact with and (suspected) exposure to covid-19: Secondary | ICD-10-CM | POA: Diagnosis not present

## 2019-07-26 DIAGNOSIS — Z8371 Family history of colonic polyps: Secondary | ICD-10-CM | POA: Diagnosis not present

## 2019-07-26 DIAGNOSIS — Z882 Allergy status to sulfonamides status: Secondary | ICD-10-CM

## 2019-07-26 DIAGNOSIS — I6612 Occlusion and stenosis of left anterior cerebral artery: Secondary | ICD-10-CM | POA: Diagnosis not present

## 2019-07-26 DIAGNOSIS — Z923 Personal history of irradiation: Secondary | ICD-10-CM

## 2019-07-26 DIAGNOSIS — I63529 Cerebral infarction due to unspecified occlusion or stenosis of unspecified anterior cerebral artery: Secondary | ICD-10-CM | POA: Diagnosis not present

## 2019-07-26 DIAGNOSIS — F411 Generalized anxiety disorder: Secondary | ICD-10-CM | POA: Diagnosis not present

## 2019-07-26 DIAGNOSIS — I6329 Cerebral infarction due to unspecified occlusion or stenosis of other precerebral arteries: Secondary | ICD-10-CM

## 2019-07-26 DIAGNOSIS — R471 Dysarthria and anarthria: Secondary | ICD-10-CM | POA: Diagnosis present

## 2019-07-26 DIAGNOSIS — I5043 Acute on chronic combined systolic (congestive) and diastolic (congestive) heart failure: Secondary | ICD-10-CM | POA: Diagnosis not present

## 2019-07-26 DIAGNOSIS — I6350 Cerebral infarction due to unspecified occlusion or stenosis of unspecified cerebral artery: Secondary | ICD-10-CM

## 2019-07-26 DIAGNOSIS — I5022 Chronic systolic (congestive) heart failure: Secondary | ICD-10-CM | POA: Diagnosis present

## 2019-07-26 DIAGNOSIS — K219 Gastro-esophageal reflux disease without esophagitis: Secondary | ICD-10-CM | POA: Diagnosis not present

## 2019-07-26 DIAGNOSIS — I5041 Acute combined systolic (congestive) and diastolic (congestive) heart failure: Secondary | ICD-10-CM | POA: Diagnosis not present

## 2019-07-26 DIAGNOSIS — E669 Obesity, unspecified: Secondary | ICD-10-CM | POA: Diagnosis present

## 2019-07-26 DIAGNOSIS — Z8249 Family history of ischemic heart disease and other diseases of the circulatory system: Secondary | ICD-10-CM | POA: Diagnosis not present

## 2019-07-26 DIAGNOSIS — I5042 Chronic combined systolic (congestive) and diastolic (congestive) heart failure: Secondary | ICD-10-CM | POA: Diagnosis not present

## 2019-07-26 DIAGNOSIS — Z741 Need for assistance with personal care: Secondary | ICD-10-CM | POA: Diagnosis not present

## 2019-07-26 DIAGNOSIS — I447 Left bundle-branch block, unspecified: Secondary | ICD-10-CM | POA: Diagnosis not present

## 2019-07-26 DIAGNOSIS — Z888 Allergy status to other drugs, medicaments and biological substances status: Secondary | ICD-10-CM | POA: Diagnosis not present

## 2019-07-26 DIAGNOSIS — I509 Heart failure, unspecified: Secondary | ICD-10-CM | POA: Diagnosis not present

## 2019-07-26 DIAGNOSIS — M6281 Muscle weakness (generalized): Secondary | ICD-10-CM | POA: Diagnosis not present

## 2019-07-26 DIAGNOSIS — I428 Other cardiomyopathies: Secondary | ICD-10-CM | POA: Diagnosis not present

## 2019-07-26 DIAGNOSIS — I5020 Unspecified systolic (congestive) heart failure: Secondary | ICD-10-CM | POA: Diagnosis not present

## 2019-07-26 DIAGNOSIS — R29704 NIHSS score 4: Secondary | ICD-10-CM | POA: Diagnosis present

## 2019-07-26 DIAGNOSIS — G43909 Migraine, unspecified, not intractable, without status migrainosus: Secondary | ICD-10-CM | POA: Diagnosis present

## 2019-07-26 DIAGNOSIS — I10 Essential (primary) hypertension: Secondary | ICD-10-CM | POA: Diagnosis present

## 2019-07-26 DIAGNOSIS — R5381 Other malaise: Secondary | ICD-10-CM | POA: Diagnosis not present

## 2019-07-26 DIAGNOSIS — Z800 Family history of malignant neoplasm of digestive organs: Secondary | ICD-10-CM | POA: Diagnosis not present

## 2019-07-26 DIAGNOSIS — Z803 Family history of malignant neoplasm of breast: Secondary | ICD-10-CM

## 2019-07-26 DIAGNOSIS — R262 Difficulty in walking, not elsewhere classified: Secondary | ICD-10-CM | POA: Diagnosis not present

## 2019-07-26 DIAGNOSIS — I6389 Other cerebral infarction: Secondary | ICD-10-CM

## 2019-07-26 DIAGNOSIS — I517 Cardiomegaly: Secondary | ICD-10-CM | POA: Diagnosis not present

## 2019-07-26 DIAGNOSIS — Z885 Allergy status to narcotic agent status: Secondary | ICD-10-CM

## 2019-07-26 DIAGNOSIS — R1111 Vomiting without nausea: Secondary | ICD-10-CM | POA: Diagnosis not present

## 2019-07-26 DIAGNOSIS — I63422 Cerebral infarction due to embolism of left anterior cerebral artery: Secondary | ICD-10-CM | POA: Diagnosis not present

## 2019-07-26 DIAGNOSIS — Z6833 Body mass index (BMI) 33.0-33.9, adult: Secondary | ICD-10-CM

## 2019-07-26 DIAGNOSIS — I6622 Occlusion and stenosis of left posterior cerebral artery: Secondary | ICD-10-CM | POA: Diagnosis not present

## 2019-07-26 DIAGNOSIS — I429 Cardiomyopathy, unspecified: Secondary | ICD-10-CM | POA: Diagnosis not present

## 2019-07-26 DIAGNOSIS — I255 Ischemic cardiomyopathy: Secondary | ICD-10-CM | POA: Diagnosis not present

## 2019-07-26 DIAGNOSIS — R279 Unspecified lack of coordination: Secondary | ICD-10-CM | POA: Diagnosis not present

## 2019-07-26 DIAGNOSIS — R600 Localized edema: Secondary | ICD-10-CM | POA: Diagnosis not present

## 2019-07-26 DIAGNOSIS — Z9011 Acquired absence of right breast and nipple: Secondary | ICD-10-CM | POA: Diagnosis not present

## 2019-07-26 DIAGNOSIS — F5109 Other insomnia not due to a substance or known physiological condition: Secondary | ICD-10-CM | POA: Diagnosis not present

## 2019-07-26 DIAGNOSIS — R069 Unspecified abnormalities of breathing: Secondary | ICD-10-CM | POA: Diagnosis not present

## 2019-07-26 DIAGNOSIS — E785 Hyperlipidemia, unspecified: Secondary | ICD-10-CM | POA: Diagnosis present

## 2019-07-26 DIAGNOSIS — I63522 Cerebral infarction due to unspecified occlusion or stenosis of left anterior cerebral artery: Secondary | ICD-10-CM | POA: Diagnosis present

## 2019-07-26 DIAGNOSIS — F0390 Unspecified dementia without behavioral disturbance: Secondary | ICD-10-CM | POA: Diagnosis not present

## 2019-07-26 DIAGNOSIS — Z886 Allergy status to analgesic agent status: Secondary | ICD-10-CM | POA: Diagnosis not present

## 2019-07-26 DIAGNOSIS — R531 Weakness: Secondary | ICD-10-CM | POA: Diagnosis not present

## 2019-07-26 DIAGNOSIS — Z881 Allergy status to other antibiotic agents status: Secondary | ICD-10-CM

## 2019-07-26 DIAGNOSIS — M2550 Pain in unspecified joint: Secondary | ICD-10-CM | POA: Diagnosis not present

## 2019-07-26 DIAGNOSIS — I340 Nonrheumatic mitral (valve) insufficiency: Secondary | ICD-10-CM | POA: Diagnosis not present

## 2019-07-26 DIAGNOSIS — E7800 Pure hypercholesterolemia, unspecified: Secondary | ICD-10-CM | POA: Diagnosis not present

## 2019-07-26 DIAGNOSIS — Z91011 Allergy to milk products: Secondary | ICD-10-CM

## 2019-07-26 DIAGNOSIS — F418 Other specified anxiety disorders: Secondary | ICD-10-CM | POA: Diagnosis not present

## 2019-07-26 DIAGNOSIS — Z7401 Bed confinement status: Secondary | ICD-10-CM | POA: Diagnosis not present

## 2019-07-26 HISTORY — PX: IR ANGIO VERTEBRAL SEL SUBCLAVIAN INNOMINATE BILAT MOD SED: IMG5366

## 2019-07-26 HISTORY — PX: RADIOLOGY WITH ANESTHESIA: SHX6223

## 2019-07-26 HISTORY — PX: IR CT HEAD LTD: IMG2386

## 2019-07-26 HISTORY — PX: IR ANGIO INTRA EXTRACRAN SEL COM CAROTID INNOMINATE UNI L MOD SED: IMG5358

## 2019-07-26 HISTORY — PX: IR PERCUTANEOUS ART THROMBECTOMY/INFUSION INTRACRANIAL INC DIAG ANGIO: IMG6087

## 2019-07-26 LAB — DIFFERENTIAL
Abs Immature Granulocytes: 0.03 10*3/uL (ref 0.00–0.07)
Basophils Absolute: 0.1 10*3/uL (ref 0.0–0.1)
Basophils Relative: 1 %
Eosinophils Absolute: 0 10*3/uL (ref 0.0–0.5)
Eosinophils Relative: 1 %
Immature Granulocytes: 0 %
Lymphocytes Relative: 23 %
Lymphs Abs: 1.8 10*3/uL (ref 0.7–4.0)
Monocytes Absolute: 0.5 10*3/uL (ref 0.1–1.0)
Monocytes Relative: 7 %
Neutro Abs: 5.5 10*3/uL (ref 1.7–7.7)
Neutrophils Relative %: 68 %

## 2019-07-26 LAB — COMPREHENSIVE METABOLIC PANEL
ALT: 30 U/L (ref 0–44)
AST: 34 U/L (ref 15–41)
Albumin: 3.3 g/dL — ABNORMAL LOW (ref 3.5–5.0)
Alkaline Phosphatase: 64 U/L (ref 38–126)
Anion gap: 10 (ref 5–15)
BUN: 18 mg/dL (ref 8–23)
CO2: 25 mmol/L (ref 22–32)
Calcium: 8.9 mg/dL (ref 8.9–10.3)
Chloride: 108 mmol/L (ref 98–111)
Creatinine, Ser: 1.18 mg/dL — ABNORMAL HIGH (ref 0.44–1.00)
GFR calc Af Amer: 55 mL/min — ABNORMAL LOW (ref >60)
GFR calc non Af Amer: 47 mL/min — ABNORMAL LOW (ref >60)
Glucose, Bld: 132 mg/dL — ABNORMAL HIGH (ref 70–99)
Potassium: 3.7 mmol/L (ref 3.5–5.1)
Sodium: 143 mmol/L (ref 135–145)
Total Bilirubin: 0.9 mg/dL (ref 0.3–1.2)
Total Protein: 6.5 g/dL (ref 6.5–8.1)

## 2019-07-26 LAB — CBC
HCT: 46.7 % — ABNORMAL HIGH (ref 36.0–46.0)
Hemoglobin: 14.8 g/dL (ref 12.0–15.0)
MCH: 29.5 pg (ref 26.0–34.0)
MCHC: 31.7 g/dL (ref 30.0–36.0)
MCV: 93 fL (ref 80.0–100.0)
Platelets: 273 10*3/uL (ref 150–400)
RBC: 5.02 MIL/uL (ref 3.87–5.11)
RDW: 15.5 % (ref 11.5–15.5)
WBC: 7.9 10*3/uL (ref 4.0–10.5)
nRBC: 0 % (ref 0.0–0.2)

## 2019-07-26 LAB — ECHOCARDIOGRAM COMPLETE: Weight: 2930 oz

## 2019-07-26 LAB — PROTIME-INR
INR: 1.1 (ref 0.8–1.2)
Prothrombin Time: 14.1 seconds (ref 11.4–15.2)

## 2019-07-26 LAB — RESPIRATORY PANEL BY RT PCR (FLU A&B, COVID)
Influenza A by PCR: NEGATIVE
Influenza B by PCR: NEGATIVE
SARS Coronavirus 2 by RT PCR: NEGATIVE

## 2019-07-26 LAB — HIV ANTIBODY (ROUTINE TESTING W REFLEX): HIV Screen 4th Generation wRfx: NONREACTIVE

## 2019-07-26 LAB — APTT: aPTT: 24 seconds (ref 24–36)

## 2019-07-26 LAB — CBG MONITORING, ED: Glucose-Capillary: 119 mg/dL — ABNORMAL HIGH (ref 70–99)

## 2019-07-26 LAB — MRSA PCR SCREENING: MRSA by PCR: NEGATIVE

## 2019-07-26 LAB — GLUCOSE, CAPILLARY
Glucose-Capillary: 105 mg/dL — ABNORMAL HIGH (ref 70–99)
Glucose-Capillary: 98 mg/dL (ref 70–99)
Glucose-Capillary: 95 mg/dL (ref 70–99)

## 2019-07-26 LAB — ETHANOL: Alcohol, Ethyl (B): <10 mg/dL (ref <10)

## 2019-07-26 SURGERY — RADIOLOGY WITH ANESTHESIA
Anesthesia: General

## 2019-07-26 MED ORDER — PROPOFOL 500 MG/50ML IV EMUL
INTRAVENOUS | Status: DC | PRN
  Administered 2019-07-26: 100 ug via INTRAVENOUS
  Administered 2019-07-26: 50 ug via INTRAVENOUS

## 2019-07-26 MED ORDER — HYDROCODONE-ACETAMINOPHEN 7.5-325 MG PO TABS
1.0000 | ORAL_TABLET | ORAL | Status: DC | PRN
  Administered 2019-07-27: 1 via ORAL
  Filled 2019-07-26: qty 1

## 2019-07-26 MED ORDER — NICARDIPINE HCL IN NACL 20-0.86 MG/200ML-% IV SOLN: 0.0000 mg/h | INTRAVENOUS | Status: DC

## 2019-07-26 MED ORDER — VANCOMYCIN HCL 1000 MG IV SOLR
INTRAVENOUS | Status: DC | PRN
  Administered 2019-07-26: 1000 mg via INTRAVENOUS

## 2019-07-26 MED ORDER — ROCURONIUM BROMIDE 10 MG/ML (PF) SYRINGE
PREFILLED_SYRINGE | INTRAVENOUS | Status: DC | PRN
  Administered 2019-07-26: 50 mg via INTRAVENOUS

## 2019-07-26 MED ORDER — DEXAMETHASONE SODIUM PHOSPHATE 10 MG/ML IJ SOLN
INTRAMUSCULAR | Status: DC | PRN
  Administered 2019-07-26: 10 mg via INTRAVENOUS

## 2019-07-26 MED ORDER — FENTANYL CITRATE (PF) 250 MCG/5ML IJ SOLN
INTRAMUSCULAR | Status: DC | PRN
  Administered 2019-07-26: 100 ug via INTRAVENOUS

## 2019-07-26 MED ORDER — ESMOLOL HCL 100 MG/10ML IV SOLN
INTRAVENOUS | Status: DC | PRN
  Administered 2019-07-26: 20 ug via INTRAVENOUS

## 2019-07-26 MED ORDER — METOPROLOL SUCCINATE ER 25 MG PO TB24
50.0000 mg | ORAL_TABLET | Freq: Every day | ORAL | Status: DC
  Administered 2019-07-27 – 2019-07-31 (×5): 50 mg via ORAL
  Filled 2019-07-26 (×6): qty 2

## 2019-07-26 MED ORDER — ALPRAZOLAM ER 1 MG PO TB24: 3.0000 mg | ORAL_TABLET | Freq: Every day | ORAL | Status: DC

## 2019-07-26 MED ORDER — EPHEDRINE SULFATE 50 MG/ML IJ SOLN
INTRAMUSCULAR | Status: DC | PRN
  Administered 2019-07-26: 10 mg via INTRAVENOUS

## 2019-07-26 MED ORDER — TIROFIBAN HCL IN NACL 5-0.9 MG/100ML-% IV SOLN
INTRAVENOUS | Status: AC
  Filled 2019-07-26: qty 100

## 2019-07-26 MED ORDER — NITROGLYCERIN 1 MG/10 ML FOR IR/CATH LAB
INTRA_ARTERIAL | Status: DC | PRN
  Administered 2019-07-26 (×3): 25 ug via INTRA_ARTERIAL

## 2019-07-26 MED ORDER — IOHEXOL 350 MG/ML SOLN
150.0000 mL | Freq: Once | INTRAVENOUS | Status: AC | PRN
  Administered 2019-07-26: 150 mL via INTRAVENOUS

## 2019-07-26 MED ORDER — ASPIRIN 81 MG PO CHEW
CHEWABLE_TABLET | ORAL | Status: AC
  Filled 2019-07-26: qty 1

## 2019-07-26 MED ORDER — SODIUM CHLORIDE 0.9 % IV SOLN: INTRAVENOUS | Status: DC

## 2019-07-26 MED ORDER — PROPOFOL 10 MG/ML IV BOLUS
INTRAVENOUS | Status: DC | PRN
  Administered 2019-07-26: 100 mg via INTRAVENOUS

## 2019-07-26 MED ORDER — ALTEPLASE (STROKE) FULL DOSE INFUSION
0.9000 mg/kg | Freq: Once | INTRAVENOUS | Status: AC
  Administered 2019-07-26: 74.8 mg via INTRAVENOUS
  Filled 2019-07-26 (×2): qty 100

## 2019-07-26 MED ORDER — STROKE: EARLY STAGES OF RECOVERY BOOK
Freq: Once | Status: AC
  Administered 2019-07-26: 1
  Filled 2019-07-26: qty 1

## 2019-07-26 MED ORDER — FUROSEMIDE 20 MG PO TABS: 20.0000 mg | ORAL_TABLET | Freq: Every day | ORAL | Status: DC

## 2019-07-26 MED ORDER — IOHEXOL 300 MG/ML  SOLN
150.0000 mL | Freq: Once | INTRAMUSCULAR | Status: AC | PRN
  Administered 2019-07-26: 40 mL via INTRA_ARTERIAL

## 2019-07-26 MED ORDER — CLEVIDIPINE BUTYRATE 0.5 MG/ML IV EMUL
INTRAVENOUS | Status: AC
  Filled 2019-07-26: qty 50

## 2019-07-26 MED ORDER — EPTIFIBATIDE 20 MG/10ML IV SOLN
INTRAVENOUS | Status: AC
  Filled 2019-07-26: qty 10

## 2019-07-26 MED ORDER — NITROGLYCERIN 1 MG/10 ML FOR IR/CATH LAB
INTRA_ARTERIAL | Status: AC
  Filled 2019-07-26: qty 10

## 2019-07-26 MED ORDER — CLEVIDIPINE BUTYRATE 0.5 MG/ML IV EMUL
INTRAVENOUS | Status: DC | PRN
  Administered 2019-07-26: 2 mg/h via INTRAVENOUS

## 2019-07-26 MED ORDER — LABETALOL HCL 5 MG/ML IV SOLN
20.0000 mg | Freq: Once | INTRAVENOUS | Status: DC
  Filled 2019-07-26 (×2): qty 4

## 2019-07-26 MED ORDER — ALBUTEROL SULFATE (2.5 MG/3ML) 0.083% IN NEBU
2.5000 mg | INHALATION_SOLUTION | RESPIRATORY_TRACT | Status: DC | PRN
  Administered 2019-07-31: 2.5 mg via RESPIRATORY_TRACT
  Filled 2019-07-26: qty 3

## 2019-07-26 MED ORDER — TICAGRELOR 90 MG PO TABS
ORAL_TABLET | ORAL | Status: AC
  Filled 2019-07-26: qty 2

## 2019-07-26 MED ORDER — CLOPIDOGREL BISULFATE 300 MG PO TABS
ORAL_TABLET | ORAL | Status: AC
  Filled 2019-07-26: qty 1

## 2019-07-26 MED ORDER — SODIUM CHLORIDE 0.9 % IV SOLN
50.0000 mL | Freq: Once | INTRAVENOUS | Status: AC
  Administered 2019-07-26: 50 mL via INTRAVENOUS

## 2019-07-26 MED ORDER — SUGAMMADEX SODIUM 200 MG/2ML IV SOLN
INTRAVENOUS | Status: DC | PRN
  Administered 2019-07-26: 250 mg via INTRAVENOUS

## 2019-07-26 MED ORDER — ALBUTEROL SULFATE HFA 108 (90 BASE) MCG/ACT IN AERS
2.0000 | INHALATION_SPRAY | RESPIRATORY_TRACT | Status: DC | PRN
  Filled 2019-07-26: qty 6.7

## 2019-07-26 MED ORDER — SUGAMMADEX SODIUM 200 MG/2ML IV SOLN: INTRAVENOUS | Status: DC | PRN

## 2019-07-26 MED ORDER — VANCOMYCIN HCL IN DEXTROSE 1-5 GM/200ML-% IV SOLN
INTRAVENOUS | Status: AC
  Filled 2019-07-26: qty 200

## 2019-07-26 MED ORDER — GLYCOPYRROLATE PF 0.2 MG/ML IJ SOSY
PREFILLED_SYRINGE | INTRAMUSCULAR | Status: DC | PRN
  Administered 2019-07-26: .2 mg via INTRAVENOUS

## 2019-07-26 MED ORDER — CHLORHEXIDINE GLUCONATE CLOTH 2 % EX PADS
6.0000 | MEDICATED_PAD | Freq: Every day | CUTANEOUS | Status: DC
  Administered 2019-07-27 – 2019-08-01 (×6): 6 via TOPICAL

## 2019-07-26 MED ORDER — SUCCINYLCHOLINE CHLORIDE 200 MG/10ML IV SOSY
PREFILLED_SYRINGE | INTRAVENOUS | Status: DC | PRN
  Administered 2019-07-26: 100 mg via INTRAVENOUS

## 2019-07-26 MED ORDER — SODIUM CHLORIDE 0.9 % IV SOLN: INTRAVENOUS | Status: DC | PRN

## 2019-07-26 MED ORDER — CLEVIDIPINE BUTYRATE 0.5 MG/ML IV EMUL
0.0000 mg/h | INTRAVENOUS | Status: DC
  Administered 2019-07-26: 2 mg/h via INTRAVENOUS
  Administered 2019-07-27: 8 mg/h via INTRAVENOUS
  Administered 2019-07-27: 2 mg/h via INTRAVENOUS
  Filled 2019-07-26 (×3): qty 50

## 2019-07-26 MED ORDER — IOHEXOL 300 MG/ML  SOLN
150.0000 mL | Freq: Once | INTRAMUSCULAR | Status: AC | PRN
  Administered 2019-07-26: 75 mL via INTRA_ARTERIAL

## 2019-07-26 MED ORDER — PANTOPRAZOLE SODIUM 40 MG IV SOLR
40.0000 mg | Freq: Every day | INTRAVENOUS | Status: DC
  Administered 2019-07-26: 40 mg via INTRAVENOUS
  Filled 2019-07-26: qty 40

## 2019-07-26 MED ORDER — SENNOSIDES-DOCUSATE SODIUM 8.6-50 MG PO TABS
1.0000 | ORAL_TABLET | Freq: Every evening | ORAL | Status: DC | PRN
  Administered 2019-07-28 – 2019-07-29 (×2): 1 via ORAL
  Filled 2019-07-26 (×2): qty 1

## 2019-07-26 MED ORDER — PHENYLEPHRINE HCL-NACL 10-0.9 MG/250ML-% IV SOLN
INTRAVENOUS | Status: DC | PRN
  Administered 2019-07-26: 25 ug/min via INTRAVENOUS

## 2019-07-26 MED ORDER — IPRATROPIUM-ALBUTEROL 0.5-2.5 (3) MG/3ML IN SOLN
3.0000 mL | Freq: Once | RESPIRATORY_TRACT | Status: AC
  Administered 2019-07-26: 3 mL via RESPIRATORY_TRACT
  Filled 2019-07-26: qty 3

## 2019-07-26 MED ORDER — CLEVIDIPINE BUTYRATE 0.5 MG/ML IV EMUL: 0.0000 mg/h | INTRAVENOUS | Status: DC

## 2019-07-26 MED ORDER — PHENYLEPHRINE 40 MCG/ML (10ML) SYRINGE FOR IV PUSH (FOR BLOOD PRESSURE SUPPORT): PREFILLED_SYRINGE | INTRAVENOUS | Status: DC | PRN

## 2019-07-26 MED ORDER — LIDOCAINE 2% (20 MG/ML) 5 ML SYRINGE
INTRAMUSCULAR | Status: DC | PRN
  Administered 2019-07-26: 60 mg via INTRAVENOUS

## 2019-07-26 NOTE — Progress Notes (Signed)
Patient s/p thrombectomy in IR today.   PA to bedside for post-procedure evaluation.   Note patient has been extubated, however is currently undergoing ECHO.  Evaluation deferred.  Will reassess 1/15  Brynda Greathouse, MS RD PA-C

## 2019-07-26 NOTE — Anesthesia Postprocedure Evaluation (Signed)
Anesthesia Post Note  Patient: Kylie Tucker  Procedure(s) Performed: RADIOLOGY WITH ANESTHESIA (N/A )     Patient location during evaluation: ICU Anesthesia Type: General Level of consciousness: awake Pain management: pain level controlled Vital Signs Assessment: post-procedure vital signs reviewed and stable Respiratory status: spontaneous breathing, nonlabored ventilation, respiratory function stable and patient connected to nasal cannula oxygen Cardiovascular status: blood pressure returned to baseline and stable Postop Assessment: no apparent nausea or vomiting Anesthetic complications: no    Last Vitals:  Vitals:   07/26/19 1500 07/26/19 1515  BP: (!) 127/59 (!) 150/62  Pulse: 62 60  Resp: (!) 21 17  Temp:    SpO2: 92% 94%    Last Pain:  Vitals:   07/26/19 1215  TempSrc:   PainSc: 0-No pain                 Korene Dula P Janayah Zavada

## 2019-07-26 NOTE — ED Notes (Signed)
PT signed consent form for IR intervention. [facility] in route Emergency traffic to transport pt to [facility].

## 2019-07-26 NOTE — ED Notes (Signed)
Report given to IR nurse Jarrett Soho at this time.

## 2019-07-26 NOTE — Progress Notes (Signed)
Patient ID: Kylie Tucker, female   DOB: [birth year], 69 y.o.   MRN: [id number] INR. 68 Y RT h f mrss 0 lsw 400AM  New onset of RT leg weakness and inattention . 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 CTP?mismatch of 36ml Endovascular treatment discussed with patient. Reasons ,risks and alternatives reviewed. Risks of ICH of 10 % worsening neuro deficit,death and inability to revascularize discussed. Patient expressed understanding and consented to the procedure. S.Kerie Badger MD  Pre procedural CT brain performed for perceived neuro changes which showed no ICH after TPA. POst treament CT brain neg for II CH or mass effect. Patient left intubated to protect as not fully responsive.. RT groin sheath left in situ. Distal pulses dopplerable DP and PT bilaterally. S.Dailon Sheeran MD

## 2019-07-26 NOTE — Procedures (Signed)
S/P 4 vessel cerebral arteriogram followed by complete revascularization of occluded Lt ACA A2 seg via RT ICA approach ,with x 1 pass with trevoprovue 17mm x 96mm retriever and penumbra aspiration achieving a TICI 3 revascularization. S.Kenishia Plack MD

## 2019-07-26 NOTE — ED Notes (Signed)
Dr Rory [facility] Neuro paged to Dr Betsey Holiday @ [phone]

## 2019-07-26 NOTE — Code Documentation (Signed)
Stroke Response Nurse Documentation Code Documentation  Kylie Tucker is a 69 y.o. female arriving to [place] ED via [facility] from [facility] ED with past medical hx of Pneumonia, HTN, Dementia, CHF, GERD. Code stroke was activated by [place] Na after patient was LKW at 0430 this morning by her husband. Pt woke up and got dressed. At Center, husband went out to warm up the car and when he came back the patient was weak on the right side. Pt taken to AP. tPA given at 0600. CTA completed and showed occlusion of the A2. MD Cheral Marker made aware. NIHSS 4 at this time. Pt consented at [facility] by Cheral Marker and Estanislado Pandy over the phone for an endovascular procedure. Stroke team at the bedside on patient arrival to [facility]. NIHSS 10, see documentation for details and code stroke times. Patient with right facial droop, right arm weakness, right leg weakness, right decreased sensation, dysarthria  and Visual  neglect on exam. Arrived to IR at 0929. CT completed on IR table to ensure pt did not have hemorrhage with worsening exam and patient receiving tPA. Bedside handoff with ED RN Jacob Moores.    Kathrin Greathouse  Stroke Response RN

## 2019-07-26 NOTE — Anesthesia Procedure Notes (Signed)
Procedure Name: Intubation Date/Time: 07/26/2019 9:44 AM Performed by: Bryson Corona, CRNA Pre-anesthesia Checklist: Patient identified, Emergency Drugs available, Suction available and Patient being monitored Patient Re-evaluated:Patient Re-evaluated prior to induction Oxygen Delivery Method: Circle System Utilized Preoxygenation: Pre-oxygenation with 100% oxygen Induction Type: IV induction Ventilation: Mask ventilation without difficulty Laryngoscope Size: Mac and 4 Grade View: Grade I Tube type: Oral Tube size: 7.0 mm Number of attempts: 1 Airway Equipment and Method: Stylet Placement Confirmation: ETT inserted through vocal cords under direct vision,  positive ETCO2 and breath sounds checked- equal and bilateral Secured at: 20 cm Tube secured with: Tape Dental Injury: Teeth and Oropharynx as per pre-operative assessment

## 2019-07-26 NOTE — Transfer of Care (Addendum)
Immediate Anesthesia Transfer of Care Note  Patient: Kylie Tucker  Procedure(s) Performed: RADIOLOGY WITH ANESTHESIA (N/A )  Patient Location: PACU  Anesthesia Type:General  Level of Consciousness: Patient remains intubated per anesthesia plan  Airway & Oxygen Therapy: Patient remains intubated per anesthesia plan and Patient placed on Ventilator (see vital sign flow sheet for setting)  Post-op Assessment: Report given to RN and Post -op Vital signs reviewed and stable  Post vital signs: Reviewed and stable  Last Vitals:  Vitals Value Taken Time  BP 132/64   Temp    Pulse 65 07/26/19 1139  Resp 20 07/26/19 1139  SpO2 100 % 07/26/19 1139  Vitals shown include unvalidated device data.  Last Pain:  Vitals:   07/26/19 0621  TempSrc:   PainSc: 0-No pain         Complications: No apparent anesthesia complications

## 2019-07-26 NOTE — ED Notes (Signed)
[facility] at bedside at this time.

## 2019-07-26 NOTE — Progress Notes (Signed)
Yellow metal ring with white stones given to husband, Darryl. No other belongings at bedside.

## 2019-07-26 NOTE — Procedures (Signed)
Extubation Procedure Note  Patient Details:   Name: Kylie Tucker DOB: 10/30/1950 MRN: [id number]   Airway Documentation:    Vent end date: 07/26/19 Vent end time: 1200   Evaluation  O2 sats: stable throughout Complications: No apparent complications Patient did tolerate procedure well.     Patient extubated per MD order without complications. Positive cuff leak. No stridor noted. RN at bedside. Patient has no cough, and doesn't follow commands. Vitals are stable.  Kylie Tucker 07/26/2019, 12:03 PM

## 2019-07-26 NOTE — Progress Notes (Signed)
  Echocardiogram 2D Echocardiogram has been performed.  Kylie Tucker 07/26/2019, 3:54 PM

## 2019-07-26 NOTE — Progress Notes (Signed)
Code stroke  Call time  455am Beeper   [place]

## 2019-07-26 NOTE — Anesthesia Preprocedure Evaluation (Addendum)
Anesthesia Evaluation  Patient identified by MRN, date of birth, ID band Patient confused    Reviewed: Allergy & Precautions, Patient's Chart, lab work & pertinent test resultsPreop documentation limited or incomplete due to emergent nature of procedure.  History of Anesthesia Complications (+) PONV and history of anesthetic complications  Airway        Dental no notable dental hx.    Pulmonary    Pulmonary exam normal        Cardiovascular hypertension, Pt. on home beta blockers +CHF  Normal cardiovascular exam+ Valvular Problems/Murmurs MR      Neuro/Psych  Headaches, PSYCHIATRIC DISORDERS Anxiety Depression CVA    GI/Hepatic Neg liver ROS, GERD  ,  Endo/Other  negative endocrine ROS  Renal/GU negative Renal ROS     Musculoskeletal negative musculoskeletal ROS (+)   Abdominal   Peds  Hematology negative hematology ROS (+)   Anesthesia Other Findings Code Stroke  Reproductive/Obstetrics                            Anesthesia Physical Anesthesia Plan  ASA: IV and emergent  Anesthesia Plan: General   Post-op Pain Management:    Induction: Intravenous  PONV Risk Score and Plan: 4 or greater and Dexamethasone and Treatment may vary due to age or medical condition  Airway Management Planned: Oral ETT  Additional Equipment: Arterial line  Intra-op Plan:   Post-operative Plan: Possible Post-op intubation/ventilation  Informed Consent:     Only emergency history available  Plan Discussed with: CRNA  Anesthesia Plan Comments:         Anesthesia Quick Evaluation

## 2019-07-26 NOTE — ED Triage Notes (Signed)
EMS called out for right sided weakness/possible stroke. Upon arrival EMS reports pt was unable to move right side, speech repetitive, pt continued to say "okay" over and over. No facial droop noted. Last seen normal was around 4:30 am when pt was putting on her make-up per pt spouse.

## 2019-07-26 NOTE — Consult Note (Addendum)
[facility] Tele[facility]urology Consult Services   Date of Service:   07/26/2019 05:21:43  Impression:     .  I63.9 - Cerebrovascular accident (CVA), unspecified mechanism ([name])  Comments/Sign-Out: Patient with inattention and right leg weakness. Sudden onset. Concern for stroke vs seizure. Given the ongoing deficit discussed risks and benefits of IV alteplase with her husband who wished to proceed with treatment. Will follow up CTA/P. ED MD has already made a call to [facility] who will be following up the CTA/P as well.  Metrics: Last Known Well: 07/26/2019 04:00:00 [facility] Notification Time: 07/26/2019 05:21:43 Arrival Time: 07/26/2019 05:06:00 Stamp Time: 07/26/2019 05:21:43 Time First Login Attempt: 07/26/2019 05:28:23 Symptoms: altered mental status and right sided weakness NIHSS Start Assessment Time: 07/26/2019 05:30:56 Alteplase Early Mix Decision Time: 07/26/2019 05:46:30 Patient is a candidate for Alteplase/Activase. Alteplase/Activase CPOE Order Time: 07/26/2019 05:47:14 Needle Time: 07/26/2019 06:00:38 Weight Noted by Staff: 83.1 kg Reason for Alteplase/Activase Delay: Medical decision making  CT head showed no acute hemorrhage or acute core infarct.  Lower Likelihood of Large Vessel Occlusion but Following Stat Studies are Recommended  CTA Head and Neck. CT Perfusion.   ED Physician notified of diagnostic impression and management plan on 07/26/2019 06:03:00  Alteplase/Activase Contraindications:  Last Known Well > 4.5 hours: No CT Head showing hemorrhage: No Ischemic stroke within 3 months: No Severe head trauma within 3 months: No Intracranial/intraspinal surgery within 3 months: No History of intracranial hemorrhage: No Symptoms and signs consistent with an SAH: No GI malignancy or GI bleed within 21 days: No Coagulopathy: Platelets <100 000/mm3, INR >1.7, aPTT>40 s, or PT >15 s: No Treatment dose of LMWH within the previous 24  hrs: No Use of NOACs in past 48 hours: No Glycoprotein IIb/IIIa receptor inhibitors use: No Symptoms consistent with infective endocarditis: No Suspected aortic arch dissection: No Intra-axial intracranial neoplasm: No  Verbal Consent to Alteplase/Activase: I have explained to the Family the nature of the patient's condition, reviewed the indications and contraindications to the use of Alteplase/Activase fibrinolytic agent, reviewed the indications and contraindications and the benefits to be reasonably expected compared with alternative approaches. I have discussed the likelihood of major risks or complications of this procedure including (if applicable) but not limited to loss of limb function, brain damage, paralysis, hemorrhage, infection, complications from transfusion of blood components, drug reactions, blood clots and loss of life. I have also indicated that with any procedure there is always the possibility of an unexpected complication. All questions were answered and Family express understanding of the treatment plan and consent to the treatment.  Our recommendations are outlined below.  Recommendations: IV Alteplase/Activase recommended.  Alteplase/Activase bolus given Without Complication.   IV Alteplase/Activase Total Dose - 74.8 mg IV Alteplase/Activase Bolus Dose - 7.5 mg IV Alteplase/Activase Infusion Dose - 67.3 mg  Routine post Alteplase/Activase monitoring including neuro checks and blood pressure control during/after treatment Monitor blood pressure Check blood pressure and NIHSS every 15 min for 2 h, then every 30 min for 6 h, and finally every hour for 16 h.  Manage Blood Pressure per post Alteplase/Activase protocol.      .  Admission to ICU     .  CT brain 24 hours post Alteplase/Activase     .  NPO until swallowing screen performed and passed     .  No antiplatelet agents or anticoagulants (including heparin for DVT prophylaxis) in first 24 hours     .  No  Foley catheter, nasogastric tube, arterial  catheter or central venous catheter for 24 hr, unless absolutely necessary     .  Telemetry     .  Bedside swallow evaluation     .  HOB less than 30 degrees     .  Euglycemia     .  Avoid hyperthermia, PRN acetaminophen     .  DVT prophylaxis     .  Inpatient Neurology Consultation     .  Stroke evaluation as per inpatient neurology recommendations  Discussed with ED physician    ------------------------------------------------------------------------------  History of Present Illness: Patient is a 69 year old Female.  Patient was brought by EMS for symptoms of altered mental status and right sided weakness  breast cancer who is presenting with speech changes and right sided weakness. She was going to get breakfast with her husband. He went to get the car and came back in and found her slumped over. She has been having falling spells in the last year. She had recent pneumonia diagnosis 2 weeks ago. No history of seizure. She woke up at 4:00 normal.   Anticoagulant use:  No  Antiplatelet use: No   Examination: BP(147/93), Pulse(73), Blood Glucose(132) 1A: Level of Consciousness - Arouses to minor stimulation + 1 1B: Ask Month and Age - Both Questions Right + 0 1C: Blink Eyes & Squeeze Hands - Performs Both Tasks + 0 2: Test Horizontal Extraocular Movements - Normal + 0 3: Test Visual Fields - No Visual Loss + 0 4: Test Facial Palsy (Use Grimace if Obtunded) - Normal symmetry + 0 5A: Test Left Arm Motor Drift - No Drift for 10 Seconds + 0 5B: Test Right Arm Motor Drift - No Drift for 10 Seconds + 0 6A: Test Left Leg Motor Drift - No Drift for 5 Seconds + 0 6B: Test Right Leg Motor Drift - No Effort Against Gravity + 3 7: Test Limb Ataxia (FNF/Heel-Shin) - No Ataxia + 0 8: Test Sensation - Normal; No sensory loss + 0 9: Test Language/Aphasia - Normal; No aphasia + 0 10: Test Dysarthria - Normal + 0 11: Test Extinction/Inattention -  No abnormality + 0  NIHSS Score: 4  Pre-Morbid Modified Ranking Scale: 0 Points = No symptoms at all   Patient/Family was informed the Neurology Consult would happen via TeleHealth consult by way of interactive audio and video telecommunications and consented to receiving care in this manner.   Due to the immediate potential for life-threatening deterioration due to underlying acute neurologic illness, I spent 60 minutes providing critical care. This time includes time for face to face visit via telemedicine, review of medical records, imaging studies and discussion of findings with providers, the patient and/or family.   Dr Carolin Sicks   [facility] [phone]   Case DI:[id number]  Addendum: CTA/P delayed due to IV issues. Per ED MD she has significantly improved with her right leg weakness although not completely back to normal.  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 ED MD has already made [facility] aware of the patient and will let them know the images are available for review now to determine if she is a candidate.  Carolin Sicks, DO [facility]

## 2019-07-26 NOTE — H&P (Signed)
Admission H&P    Chief Complaint: Acute onset of RLE weakness.   HPI: Kylie Tucker is an 69 y.o. female who initially presented to the [facility] ED after husband noted her to be weak on her right side shortly after 4:30 AM. LKN was 0430 when she got up, felt hungry, wanted to go out to eat and started putting on her makeup. She was brought to the [facility] ED where Teleneurology consult was initiated. CT head was negative for hemorrhage. She was deemed to be a candidate for tPA, which was started at [facility] at 0600. CTA showed left A2 occlusion. The patient was emergently transported to [facility] as a Code IR. Informed consent for endovascular procedure was obtained at [facility]. While still at [facility], the patient was lucid and able to provide informed consent with her husband present, over the telephone. Upon arrival to [facility], she was confused with perseverative speech, but able to answer some orientation questions.   CT head: Negative head CT  CTA head and neck with CTP: . Left A2 embolism with short segment occlusion. There is an associated 8 cc core infarct and 22 cc of penumbra. 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 Bilateral ICA looping in the neck. Minimal atheromatous changes. There is cardiomegaly and layering pleural effusions, question cardiac source.      LSN: [id number] tPA Given: Yes   Past Medical History:  Diagnosis Date  . Allergy   . Anxiety   . Bilateral ovarian cysts    Do yearly ultrasound.  . Cancer ([place]) rt breast   2004/ chemo/ radiation  . Cellulitis and abscess of upper arm and forearm   . Chronic combined systolic and diastolic CHF (congestive heart failure) ([place])   . Dementia ([place])   . Family history of breast cancer   . Family history of rectal cancer   . GERD (gastroesophageal reflux disease)   . H/O Helicobacter infection 2017  . History of right mastectomy 2004   chemo/ radiation  . Hyperlipidemia   . Hypertension   . Insomnia   . Left bundle branch block 2016  . Migraines   . Moderate mitral  regurgitation   . NICM (nonischemic cardiomyopathy) ([place])   . Pneumonia   . PONV (postoperative nausea and vomiting)    cannot take zofran  . Postmastectomy lymphedema rt side  . Sentinel node     Past Surgical History:  Procedure Laterality Date  . BREAST EXCISIONAL BIOPSY Left    benign  . CARPAL TUNNEL RELEASE    . CATARACT EXTRACTION Left 07/12/2013  . MANDIBLE SURGERY  1991  . MASTECTOMY Right    malignant  . NASAL SINUS SURGERY  1990/2001  . ORIF FINGER FRACTURE  02/14/2012   Procedure: OPEN REDUCTION INTERNAL FIXATION (ORIF) METACARPAL (FINGER) FRACTURE;  Surgeon: Wynonia Sours, MD;  Location: [place];  Service: Orthopedics;  Laterality: Right;  RIGHT FIFTH   . ORIF METACARPAL FRACTURE  8/13   rt   . ORIF PATELLA Right 11/07/2018   Procedure: OPEN REDUCTION INTERNAL (ORIF) FIXATION PATELLA;  Surgeon: Nicholes Stairs, MD;  Location: [place];  Service: Orthopedics;  Laterality: Right;  . ORIF PATELLA FRACTURE Right 11/07/2018  . PORT-A-CATH REMOVAL     in and now out  . REPAIR EXTENSOR TENDON Left 05/30/2013   Procedure: RELEASE TRANSPOSITION EXTENSOR POLLICUS LONGUS LEFT WRIST;  Surgeon: Wynonia Sours, MD;  Location: [place];  Service: Orthopedics;  Laterality: Left;  . rt mastectomy  2004  lymph nodes-7-axillary node dissection  . SHOULDER ARTHROSCOPY WITH ROTATOR CUFF REPAIR AND SUBACROMIAL DECOMPRESSION Left 11/22/2013   Procedure: LEFT SHOULDER ARTHROSCOPY WITH SUBACROMIAL DECOMPRESSION DISTAL CLAVICLE RESECTION REPAIR  ROTATOR CUFF AS NEEDED ;  Surgeon: Cammie Sickle., MD;  Location: [place];  Service: Orthopedics;  Laterality: Left;  . WRIST FRACTURE SURGERY      Family History  Problem Relation Age of Onset  . Heart disease Father   . Heart disease Mother   . Hypertension Mother   . Rectal cancer Mother        dx in her early 75s  . Colon polyps Mother   . Breast cancer Maternal Aunt        dx in her  early 3s  . Heart disease Paternal Uncle   . Heart disease Paternal Grandmother   . Heart disease Paternal Grandfather   . Diabetes Brother   . Breast cancer Maternal Grandmother        dx in her 79s  . Breast cancer Cousin        dx in early 3s  . Breast cancer Cousin 59  . Hepatitis C Son   . Hypertension Son   . Post-traumatic stress disorder Son    Social History:  reports that she has never smoked. She has never used smokeless tobacco. She reports that she does not drink alcohol or use drugs.  Allergies:  Allergies  Allergen Reactions  . Clonazepam Shortness Of Breath and Other (See Comments)    Heart had a "weird feeling" and difficulty breathing  . Hydrocodone-Acetaminophen Nausea And Vomiting    GI Upset (intolerance) Patient stated 4/28 that she is able to take Hydrocodone  . Rosuvastatin Other (See Comments)    myalgia  . Trazodone And Nefazodone Nausea Only and Other (See Comments)    Felt like "I was burning all over" -patient  . Acetaminophen Nausea Only  . Amoxicillin-Pot Clavulanate Nausea Only    flushing Did it involve swelling of the face/tongue/throat, SOB, or low BP? No Did it involve sudden or severe rash/hives, skin peeling, or any reaction on the inside of your mouth or nose? No Did you need to seek medical attention at a hospital or doctor's office? Unknown When did it last happen?5 years If all above answers are "NO", may proceed with cephalosporin use.   . Cefuroxime Axetil Nausea Only  . Cephalexin Nausea Only  . Levofloxacin Nausea Only  . Morphine And Related Other (See Comments)    Hallucinations  . Ondansetron Nausea And Vomiting  . Propoxyphene N-Acetaminophen Nausea And Vomiting  . Sulfonamide Derivatives Itching  . Vioxx [Rofecoxib] Nausea And Vomiting  . Aspirin Nausea And Vomiting    Gastrointestinal upset  . Azithromycin Palpitations  . Clarithromycin Nausea Only and Anxiety  . Codeine Itching and Rash  . Milk-Related  Compounds Nausea And Vomiting  . Seroquel [Quetiapine Fumarate] Palpitations    Heart racing   . Sulfamethoxazole Rash  . Tramadol Nausea And Vomiting    Home Medications: No current facility-administered medications on file prior to encounter.   Current Outpatient Medications on File Prior to Encounter  Medication Sig Dispense Refill  . albuterol (VENTOLIN HFA) 108 (90 Base) MCG/ACT inhaler Inhale 1-2 puffs into the lungs every 6 (six) hours as needed for wheezing or shortness of breath. 18 g 0  . ALPRAZOLAM XR 3 MG 24 hr tablet Take 3 mg by mouth at bedtime.    Marland Kitchen anti-nausea (EMETROL) solution Take  15 mLs by mouth every 15 (fifteen) minutes as needed for nausea or vomiting.    Marland Kitchen azithromycin (ZITHROMAX) 250 MG tablet Take 1 tablet (250 mg total) by mouth daily. Take first 2 tablets together, then 1 every day until finished. 6 tablet 0  . benzonatate (TESSALON) 100 MG capsule Take 1 capsule (100 mg total) by mouth every 8 (eight) hours. 21 capsule 0  . BIOTIN PO Take 1 tablet by mouth daily.    Marland Kitchen bismuth subsalicylate (PEPTO BISMOL) 262 MG/15ML suspension Take 30 mLs by mouth daily as needed for indigestion.     . calcium carbonate (OS-CAL) 600 MG TABS tablet Take 1 tablet by mouth daily.    . Cholecalciferol (D2000 ULTRA STRENGTH) 50 MCG (2000 UT) CAPS Take 1 capsule by mouth daily.    . diazepam (VALIUM) 10 MG tablet Take 1 tablet by mouth as needed.    . furosemide (LASIX) 20 MG tablet Take 20 mg by mouth daily.     Marland Kitchen GARLIC PO Take 1 capsule by mouth daily.    Marland Kitchen HYDROcodone-acetaminophen (NORCO) 7.5-325 MG tablet Take 1-2 tablets by mouth every 4 (four) hours as needed for severe pain (pain score 7-10). 45 tablet 0  . ibuprofen (ADVIL) 200 MG tablet Take 200 mg by mouth daily as needed for headache or moderate pain.     Marland Kitchen loperamide (IMODIUM) 1 MG/5ML solution Take 2 mg by mouth as needed for diarrhea or loose stools.    . metoprolol succinate (TOPROL-XL) 50 MG 24 hr tablet Take 50  mg by mouth at bedtime.     . Multiple Vitamin (MULTIVITAMIN WITH MINERALS) TABS tablet Take 1 tablet by mouth daily.    Marland Kitchen omeprazole (PRILOSEC) 40 MG capsule Take 40 mg by mouth daily.    . Polyvinyl Alcohol-Povidone (REFRESH OP) Place 1 drop into both eyes at bedtime.    . sucralfate (CARAFATE) 1 g tablet Take 1 tablet (1 g total) by mouth 4 (four) times daily -  with meals and at bedtime. 21 tablet 0  . triazolam (HALCION) 0.25 MG tablet Take 0.25 mg by mouth at bedtime as needed. Patient states she is not taking this medication      ROS: Unable to obtain due to perseverative speech.   Physical Examination: Blood pressure 140/72, pulse 77, temperature 98.1 F (36.7 C), resp. rate (!) 22, weight 83.1 kg, last menstrual period 01/10/2003, SpO2 96 %.  HEENT-  [place]/AT  Lungs - Respirations unlabored Extremities - Pitting edema BLE  Neurologic Examination: Mental Status: Awake with decreased level of alertness. Appears confused. Speech sparse and hesitant. Able to name a thumb and pinky, then perseverates "thumb" when shown index finger despite being corrected. Often perseverates. Oriented to year and month as well as the date. States "[place]" when asked for the city. Follows simple commands only. Poor attention.  Cranial Nerves: II:  Visual fields with possible right visual field cut. PERRL. III,IV, VI: Gaze conjugate and able to track easily to the left, but with significant hesitancy when attempting to gaze to right. V,VII: Mild right facial droop. Temp sensation equal bilaterally.  VIII: hearing intact to voice IX,X: Unable to visualize palate XI: Head slightly rotated to the left preferentially XII: midline tongue extension  Motor: RUE with decreased spontaneous movement and mild bradykinesia. 4+/5. Mild drift when held antigravity.  RLE with no effort against gravity. Falls to bed after passive elevation and release.  LUE 5/5 LLE 4/5 Sensory: Temp and FT intact to  BUE and BLE.  Extinction on the right to DSS.  Deep Tendon Reflexes:  Decreased on the right relative to the left.  Plantars: Right mute, left downgoing.  Cerebellar: No gross ataxia with bilateral FNF Gait: Deferred  Results for orders placed or performed during the hospital encounter of 07/26/19 (from the past 48 hour(s))  CBG monitoring, ED     Status: Abnormal   Collection Time: 07/26/19  5:20 AM  Result Value Ref Range   Glucose-Capillary 119 (H) 70 - 99 mg/dL   Comment 1 Document in Chart   Ethanol     Status: None   Collection Time: 07/26/19  5:24 AM  Result Value Ref Range   Alcohol, Ethyl (B) <10 <10 mg/dL    Comment: (NOTE) Lowest detectable limit for serum alcohol is 10 mg/dL. For medical purposes only. Performed at [facility], [address], [place], [place] [address]   Protime-INR     Status: None   Collection Time: 07/26/19  5:24 AM  Result Value Ref Range   Prothrombin Time 14.1 11.4 - 15.2 seconds   INR 1.1 0.8 - 1.2    Comment: (NOTE) INR goal varies based on device and disease states. Performed at [facility], [address] [place], [place] [address]   APTT     Status: None   Collection Time: 07/26/19  5:24 AM  Result Value Ref Range   aPTT 24 24 - 36 seconds    Comment: Performed at [facility], [address] [place], [place] [address]  CBC     Status: Abnormal   Collection Time: 07/26/19  5:24 AM  Result Value Ref Range   WBC 7.9 4.0 - 10.5 K/uL   RBC 5.02 3.87 - 5.11 MIL/uL   Hemoglobin 14.8 12.0 - 15.0 g/dL   HCT 46.7 (H) 36.0 - 46.0 %   MCV 93.0 80.0 - 100.0 fL   MCH 29.5 26.0 - 34.0 pg   MCHC 31.7 30.0 - 36.0 g/dL   RDW 15.5 11.5 - 15.5 %   Platelets 273 150 - 400 K/uL   nRBC 0.0 0.0 - 0.2 %    Comment: Performed at [facility], [address], [place], [place] [address]  Differential     Status: None   Collection Time: 07/26/19  5:24 AM  Result Value Ref Range   Neutrophils Relative % 68 %   Neutro Abs 5.5 1.7 - 7.7 K/uL   Lymphocytes Relative  23 %   Lymphs Abs 1.8 0.7 - 4.0 K/uL   Monocytes Relative 7 %   Monocytes Absolute 0.5 0.1 - 1.0 K/uL   Eosinophils Relative 1 %   Eosinophils Absolute 0.0 0.0 - 0.5 K/uL   Basophils Relative 1 %   Basophils Absolute 0.1 0.0 - 0.1 K/uL   Immature Granulocytes 0 %   Abs Immature Granulocytes 0.03 0.00 - 0.07 K/uL    Comment: Performed at [facility], [address], [place], [place] [address]  Comprehensive metabolic panel     Status: Abnormal   Collection Time: 07/26/19  5:24 AM  Result Value Ref Range   Sodium 143 135 - 145 mmol/L   Potassium 3.7 3.5 - 5.1 mmol/L   Chloride 108 98 - 111 mmol/L   CO2 25 22 - 32 mmol/L   Glucose, Bld 132 (H) 70 - 99 mg/dL   BUN 18 8 - 23 mg/dL   Creatinine, Ser 1.18 (H) 0.44 - 1.00 mg/dL   Calcium 8.9 8.9 - 10.3 mg/dL  Total Protein 6.5 6.5 - 8.1 g/dL   Albumin 3.3 (L) 3.5 - 5.0 g/dL   AST 34 15 - 41 U/L   ALT 30 0 - 44 U/L   Alkaline Phosphatase 64 38 - 126 U/L   Total Bilirubin 0.9 0.3 - 1.2 mg/dL   GFR calc non Af Amer 47 (L) >60 mL/min   GFR calc Af Amer 55 (L) >60 mL/min   Anion gap 10 5 - 15    Comment: Performed at [facility], [address], [place], [place] [address]   CT ANGIO HEAD W OR WO CONTRAST  Result Date: 07/26/2019 CLINICAL DATA:  In attention and right leg weakness, sudden onset. EXAM: CT ANGIOGRAPHY HEAD AND NECK CT PERFUSION BRAIN TECHNIQUE: Multidetector CT imaging of the head and neck was performed using the standard protocol during bolus administration of intravenous contrast. Multiplanar CT image reconstructions and MIPs were obtained to evaluate the vascular anatomy. Carotid stenosis measurements (when applicable) are obtained utilizing NASCET criteria, using the distal internal carotid diameter as the denominator. Multiphase CT imaging of the brain was performed following IV bolus contrast injection. Subsequent parametric perfusion maps were calculated using RAPID software. CONTRAST:  164mL OMNIPAQUE IOHEXOL 350 MG/ML  SOLN COMPARISON:  Noncontrast head CT earlier today FINDINGS: CTA NECK FINDINGS Aortic arch: Mild atheromatous changes. Three vessel branching. No dilatation. Right carotid system: No noted atheromatous changes. ICA tortuosity with loop. Left carotid system: Smaller vessel size to circle-of-Willis variant. ICA looping at the skull base. No noted atheromatous changes, stenosis, or ulceration. Vertebral arteries: No proximal subclavian stenosis or atherosclerosis. Mild left vertebral artery dominance. Both vertebral arteries are smooth and widely patent to the dura. Skeleton: Cervical spine degeneration and bilateral mandibular osteoplasty Other neck: No acute finding Upper chest: Layering pleural effusions. Cardiomegaly on preceding x-ray. Review of the MIP images confirms the above findings CTA HEAD FINDINGS Anterior circulation: Aplastic left A1 segment. Left A2 short segment occlusion with distal reconstitution, acute appearing and likely embolic. Negative for aneurysm Posterior circulation: Smooth vertebral and basilar arteries. No PCA branch occlusion. Venous sinuses: Negative for aneurysm. Anatomic variants: As above Review of the MIP images confirms the above findings CT Brain Perfusion Findings: ASPECTS: 10 CBF (<30%) Volume: 72mL Perfusion (Tmax>6.0s) volume: 54mL Mismatch Volume: 19mL Infarction Location:Left frontal These results were called by telephone at the time of interpretation on 07/26/2019 at 7:49 am to provider Pollina , who verbally acknowledged these results. IMPRESSION: 1. Left A2 embolism with short segment occlusion. There is an associated 8 cc core infarct and 22 cc of penumbra. 2. Minimal atheromatous changes. There is however cardiomegaly and layering pleural effusions, question cardiac source. 3. Bilateral ICA looping in the neck.  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 Electronically Signed   By: Monte Fantasia M.D.   On: 07/26/2019 07:53   CT ANGIO NECK W OR WO CONTRAST  Result Date:  07/26/2019 CLINICAL DATA:  In attention and right leg weakness, sudden onset. EXAM: CT ANGIOGRAPHY HEAD AND NECK CT PERFUSION BRAIN TECHNIQUE: Multidetector CT imaging of the head and neck was performed using the standard protocol during bolus administration of intravenous contrast. Multiplanar CT image reconstructions and MIPs were obtained to evaluate the vascular anatomy. Carotid stenosis measurements (when applicable) are obtained utilizing NASCET criteria, using the distal internal carotid diameter as the denominator. Multiphase CT imaging of the brain was performed following IV bolus contrast injection. Subsequent parametric perfusion maps were calculated using RAPID software. CONTRAST:  166mL OMNIPAQUE IOHEXOL 350 MG/ML SOLN  COMPARISON:  Noncontrast head CT earlier today FINDINGS: CTA NECK FINDINGS Aortic arch: Mild atheromatous changes. Three vessel branching. No dilatation. Right carotid system: No noted atheromatous changes. ICA tortuosity with loop. Left carotid system: Smaller vessel size to circle-of-Willis variant. ICA looping at the skull base. No noted atheromatous changes, stenosis, or ulceration. Vertebral arteries: No proximal subclavian stenosis or atherosclerosis. Mild left vertebral artery dominance. Both vertebral arteries are smooth and widely patent to the dura. Skeleton: Cervical spine degeneration and bilateral mandibular osteoplasty Other neck: No acute finding Upper chest: Layering pleural effusions. Cardiomegaly on preceding x-ray. Review of the MIP images confirms the above findings CTA HEAD FINDINGS Anterior circulation: Aplastic left A1 segment. Left A2 short segment occlusion with distal reconstitution, acute appearing and likely embolic. Negative for aneurysm Posterior circulation: Smooth vertebral and basilar arteries. No PCA branch occlusion. Venous sinuses: Negative for aneurysm. Anatomic variants: As above Review of the MIP images confirms the above findings CT Brain Perfusion  Findings: ASPECTS: 10 CBF (<30%) Volume: 31mL Perfusion (Tmax>6.0s) volume: 29mL Mismatch Volume: 41mL Infarction Location:Left frontal These results were called by telephone at the time of interpretation on 07/26/2019 at 7:49 am to provider Pollina , who verbally acknowledged these results. IMPRESSION: 1. Left A2 embolism with short segment occlusion. There is an associated 8 cc core infarct and 22 cc of penumbra. 2. Minimal atheromatous changes. There is however cardiomegaly and layering pleural effusions, question cardiac source. 3. Bilateral ICA looping in the neck.  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 Electronically Signed   By: Monte Fantasia M.D.   On: 07/26/2019 07:53   CT CEREBRAL PERFUSION W CONTRAST  Result Date: 07/26/2019 CLINICAL DATA:  In attention and right leg weakness, sudden onset. EXAM: CT ANGIOGRAPHY HEAD AND NECK CT PERFUSION BRAIN TECHNIQUE: Multidetector CT imaging of the head and neck was performed using the standard protocol during bolus administration of intravenous contrast. Multiplanar CT image reconstructions and MIPs were obtained to evaluate the vascular anatomy. Carotid stenosis measurements (when applicable) are obtained utilizing NASCET criteria, using the distal internal carotid diameter as the denominator. Multiphase CT imaging of the brain was performed following IV bolus contrast injection. Subsequent parametric perfusion maps were calculated using RAPID software. CONTRAST:  142mL OMNIPAQUE IOHEXOL 350 MG/ML SOLN COMPARISON:  Noncontrast head CT earlier today FINDINGS: CTA NECK FINDINGS Aortic arch: Mild atheromatous changes. Three vessel branching. No dilatation. Right carotid system: No noted atheromatous changes. ICA tortuosity with loop. Left carotid system: Smaller vessel size to circle-of-Willis variant. ICA looping at the skull base. No noted atheromatous changes, stenosis, or ulceration. Vertebral arteries: No proximal subclavian stenosis or atherosclerosis. Mild left vertebral  artery dominance. Both vertebral arteries are smooth and widely patent to the dura. Skeleton: Cervical spine degeneration and bilateral mandibular osteoplasty Other neck: No acute finding Upper chest: Layering pleural effusions. Cardiomegaly on preceding x-ray. Review of the MIP images confirms the above findings CTA HEAD FINDINGS Anterior circulation: Aplastic left A1 segment. Left A2 short segment occlusion with distal reconstitution, acute appearing and likely embolic. Negative for aneurysm Posterior circulation: Smooth vertebral and basilar arteries. No PCA branch occlusion. Venous sinuses: Negative for aneurysm. Anatomic variants: As above Review of the MIP images confirms the above findings CT Brain Perfusion Findings: ASPECTS: 10 CBF (<30%) Volume: 74mL Perfusion (Tmax>6.0s) volume: 31mL Mismatch Volume: 81mL Infarction Location:Left frontal These results were called by telephone at the time of interpretation on 07/26/2019 at 7:49 am to provider Pollina , who verbally acknowledged these results. IMPRESSION: 1. Left A2  embolism with short segment occlusion. There is an associated 8 cc core infarct and 22 cc of penumbra. 2. Minimal atheromatous changes. There is however cardiomegaly and layering pleural effusions, question cardiac source. 3. Bilateral ICA looping in the neck.  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 Electronically Signed   By: Monte Fantasia M.D.   On: 07/26/2019 07:53   CT HEAD CODE STROKE WO CONTRAST  Result Date: 07/26/2019 CLINICAL DATA:  Code stroke.  Sudden onset weakness EXAM: CT HEAD WITHOUT CONTRAST TECHNIQUE: Contiguous axial images were obtained from the base of the skull through the vertex without intravenous contrast. COMPARISON:  04/06/2018 FINDINGS: Brain: No evidence of acute infarction, hemorrhage, hydrocephalus, extra-axial collection or mass lesion/mass effect. Vascular: No hyperdense vessel or unexpected calcification. Skull: Normal. Negative for fracture or focal lesion.  Sinuses/Orbits: No acute finding. Other: Attempted call report. ASPECTS [facility] Stroke Program Early CT Score) Not scored without laterality. IMPRESSION: Negative head CT. Electronically Signed   By: Monte Fantasia M.D.   On: 07/26/2019 05:21    Assessment: 69 y.o. 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  1. Exam reveals plegic RLE, paretic RUE, sensory extinction on the right and AMS 2. CT head: Negative head CT 3. CTA head and neck with CTP: . Left A2 embolism with short segment occlusion. There is an associated 8 cc core infarct and 22 cc of penumbra. 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 Bilateral ICA looping in the neck. Minimal atheromatous changes. There is cardiomegaly and layering pleural effusions, question cardiac source 4. Stroke Risk Factors - Cancer, CHF, HLD and HTN 5. History of dementia  Plan: 1. The patient is a VIR candidate. Risks/benefits of the procedure were discussed extensively with patient and her husband over the telephone, including approximately 50% chance of significant improvement relative to an approximate 10% chance of subarachnoid hemorrhage with possibility of significant worsening including death. The patient expressed understanding and provided informed consent to proceed with VIR. All questions answered. Consent form signed.  2. Admitting to Neuro ICU following VIR. Post-tPA and VIR orders to include frequent neuro checks and BP management.  3. No antiplatelet medications or anticoagulants for at least 24 hours following tPA. Not on an antiplatelet medication at home.  4. DVT prophylaxis with SCDs.  5. Will need to be started on a statin.  6. Will need to start ASA if follow up CT at 24 hours is negative for hemorrhagic conversion. 7. TTE. 8. Telemetry monitoring  9. MRI brain  10. PT/OT/Speech.  11. NPO until passes swallow evaluation.  12. Fasting lipid panel, HgbA1c      65 minutes spent in the emergent neurological evaluation and management of this critically ill patient. Time spent included coordination of care and review of imaging studies.   Electronically signed: Dr. Kerney Elbe 07/26/2019, 8:11 AM

## 2019-07-26 NOTE — Consult Note (Signed)
NAME:  Kylie Tucker, MRN:  [id number], DOB:  29-Jun-1951, LOS: 0 ADMISSION DATE:  07/26/2019, CONSULTATION DATE:  07/26/19 REFERRING MD:  Estanislado Pandy, CHIEF COMPLAINT:  Leg weakness   Brief History   69 year old woman with history of breast cancer, GERD, NICM, LBBB, ?dementia, allergies, anxiety presenting with L ACA stroke s/p tPA and mechanical clot retrieval.  History of present illness   14 year   Past Medical History  68 year old woman with history of breast cancer, GERD, NICM, LBBB, ?dementia, allergies, anxiety presenting to [facility] 1/14 AM with sudden onset right leg weakness found to have occluded left ACA.  She underwent systemic tPA at [facility] and transfer to [facility] for mechanical clot retrieval by Dr. Estanislado Pandy at L ACA II with TICI 3 post procedural flow and no hemorrhage.  PCCM consulted for vent management.  History per chart review as patient is intubated and sedated.  Significant Hospital Events   07/26/19 admitted  Consults:  Neurology Neurointerventional PCCM  Procedures:  07/26/19 4 vessel cerebral arteriogram followed by complete revascularization of occluded Lt ACA A2 seg via RT ICA approach ,with x 1 pass with trevoprovue 64mm x 83mm retriever and penumbra aspiration achieving a TICI 3 revascularization.  Significant Diagnostic Tests:  CTA Head IMPRESSION: 1. Left A2 embolism with short segment occlusion. There is an associated 8 cc core infarct and 22 cc of penumbra. 2. Minimal atheromatous changes. There is however cardiomegaly and layering pleural effusions, question cardiac source. 3. Bilateral ICA looping in the neck.  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  Micro Data:  COVID Neg  Antimicrobials:  N/A   Interim history/subjective:  Admitted  Objective   Blood pressure (!) 150/84, pulse 83, temperature 98.1 F (36.7 C), resp. rate (!) 22, weight 83.1 kg, last menstrual period 01/10/2003, SpO2 92 %.        Intake/Output Summary (Last 24 hours) at 07/26/2019  1120 Last data filed at 07/26/2019 0945 Gross per 24 hour  Intake 300 ml  Output --  Net 300 ml   Filed Weights   07/26/19 0532  Weight: 83.1 kg    Examination: General: elderly woman in NAD HENT: trachea midline, pupils pinpoint Lungs: Clear, triggering vent Cardiovascular: RRR, ext warm Abdomen: Soft, +BS Extremities: No edema Neuro: Withdraws briskly on left, not much on R Skin: No rashes, sheath looks CDI  2019 echo ef [id number], G1 diastolic dysfunction  Resolved Hospital Problem list   N/A  Assessment & Plan:  # Left ACA Ischemic CVA s/p systemic tPA and mechanical clot retrieval with good angiographic results # Postoperative respiratory failure after above procedure # Underlying NICM # History of breast cancer, right effusion, pretracheal adenopathy- warrants eval once things settle out  - Wean to extubate - CCB drip titrated to SBP 120-140 - PT/OT/SLP consults - Further imaging and diagnostic workup per primary  Best practice:  Diet: NPO pending SLP eval Pain/Anxiety/Delirium protocol (if indicated): DC VAP protocol (if indicated): Not needed DVT prophylaxis: SCDs GI prophylaxis: PPI Glucose control: q4h checks until taking PO Mobility: BR pending PT eval Code Status: Full Family Communication: per primary Disposition:  Neuro ICU  Labs   CBC: Recent Labs  Lab 07/26/19 0524  WBC 7.9  NEUTROABS 5.5  HGB 14.8  HCT 46.7*  MCV 93.0  PLT 123456    Basic Metabolic Panel: Recent Labs  Lab 07/26/19 0524  NA 143  K 3.7  CL 108  CO2 25  GLUCOSE 132*  BUN 18  CREATININE 1.18*  CALCIUM 8.9   GFR: Estimated Creatinine Clearance: 45.6 mL/min (A) (by C-G formula based on SCr of 1.18 mg/dL (H)). Recent Labs  Lab 07/26/19 0524  WBC 7.9    Liver Function Tests: Recent Labs  Lab 07/26/19 0524  AST 34  ALT 30  ALKPHOS 64  BILITOT 0.9  PROT 6.5  ALBUMIN 3.3*   No results for input(s): LIPASE, AMYLASE in the last 168 hours. No results for  input(s): AMMONIA in the last 168 hours.  ABG    Component Value Date/Time   TCO2 29 06/28/2011 2200     Coagulation Profile: Recent Labs  Lab 07/26/19 0524  INR 1.1    Cardiac Enzymes: No results for input(s): CKTOTAL, CKMB, CKMBINDEX, TROPONINI in the last 168 hours.  HbA1C: No results found for: HGBA1C  CBG: Recent Labs  Lab 07/26/19 0520  GLUCAP 119*    Review of Systems:   Cannot assess, patient intubated and sedated  Past Medical History  She,  has a past medical history of Allergy, Anxiety, Bilateral ovarian cysts, Cancer (HCC) (rt breast), Cellulitis and abscess of upper arm and forearm, Chronic combined systolic and diastolic CHF (congestive heart failure) ([place]), Dementia ([place]), Family history of breast cancer, Family history of rectal cancer, GERD (gastroesophageal reflux disease), H/O Helicobacter infection (2017), History of right mastectomy (2004), Hyperlipidemia, Hypertension, Insomnia, Left bundle branch block (2016), Migraines, Moderate mitral regurgitation, NICM (nonischemic cardiomyopathy) ([place]), Pneumonia, PONV (postoperative nausea and vomiting), Postmastectomy lymphedema (rt side), and Sentinel node.   Surgical History    Past Surgical History:  Procedure Laterality Date  . BREAST EXCISIONAL BIOPSY Left    benign  . CARPAL TUNNEL RELEASE    . CATARACT EXTRACTION Left 07/12/2013  . MANDIBLE SURGERY  1991  . MASTECTOMY Right    malignant  . NASAL SINUS SURGERY  1990/2001  . ORIF FINGER FRACTURE  02/14/2012   Procedure: OPEN REDUCTION INTERNAL FIXATION (ORIF) METACARPAL (FINGER) FRACTURE;  Surgeon: Wynonia Sours, MD;  Location: [place];  Service: Orthopedics;  Laterality: Right;  RIGHT FIFTH   . ORIF METACARPAL FRACTURE  8/13   rt   . ORIF PATELLA Right 11/07/2018   Procedure: OPEN REDUCTION INTERNAL (ORIF) FIXATION PATELLA;  Surgeon: Nicholes Stairs, MD;  Location: [place];  Service: Orthopedics;  Laterality: Right;  . ORIF  PATELLA FRACTURE Right 11/07/2018  . PORT-A-CATH REMOVAL     in and now out  . REPAIR EXTENSOR TENDON Left 05/30/2013   Procedure: RELEASE TRANSPOSITION EXTENSOR POLLICUS LONGUS LEFT WRIST;  Surgeon: Wynonia Sours, MD;  Location: [place];  Service: Orthopedics;  Laterality: Left;  . rt mastectomy  2004   lymph nodes-7-axillary node dissection  . SHOULDER ARTHROSCOPY WITH ROTATOR CUFF REPAIR AND SUBACROMIAL DECOMPRESSION Left 11/22/2013   Procedure: LEFT SHOULDER ARTHROSCOPY WITH SUBACROMIAL DECOMPRESSION DISTAL CLAVICLE RESECTION REPAIR  ROTATOR CUFF AS NEEDED ;  Surgeon: Cammie Sickle., MD;  Location[place]  Service: Orthopedics;  Laterality: Left;  . WRIST FRACTURE SURGERY       Social History   reports that she has never smoked. She has never used smokeless tobacco. She reports that she does not drink alcohol or use drugs.   Family History   Her family history includes Breast cancer in her cousin, maternal aunt, and maternal grandmother; Breast cancer (age of onset: 52) in her cousin; Colon polyps in her mother; Diabetes in her brother; Heart disease in her father, mother,  paternal grandfather, paternal grandmother, and paternal uncle; Hepatitis C in her son; Hypertension in her mother and son; Post-traumatic stress disorder in her son; Rectal cancer in her mother.   Allergies Allergies  Allergen Reactions  . Clonazepam Shortness Of Breath and Other (See Comments)    Heart had a "weird feeling" and difficulty breathing  . Hydrocodone-Acetaminophen Nausea And Vomiting    GI Upset (intolerance) Patient stated 4/28 that she is able to take Hydrocodone  . Rosuvastatin Other (See Comments)    myalgia  . Trazodone And Nefazodone Nausea Only and Other (See Comments)    Felt like "I was burning all over" -patient  . Acetaminophen Nausea Only  . Amoxicillin-Pot Clavulanate Nausea Only    flushing Did it involve swelling of the face/tongue/throat,  SOB, or low BP? No Did it involve sudden or severe rash/hives, skin peeling, or any reaction on the inside of your mouth or nose? No Did you need to seek medical attention at a hospital or doctor's office? Unknown When did it last happen?5 years If all above answers are "NO", may proceed with cephalosporin use.   . Cefuroxime Axetil Nausea Only  . Cephalexin Nausea Only  . Levofloxacin Nausea Only  . Morphine And Related Other (See Comments)    Hallucinations  . Ondansetron Nausea And Vomiting  . Propoxyphene N-Acetaminophen Nausea And Vomiting  . Sulfonamide Derivatives Itching  . Vioxx [Rofecoxib] Nausea And Vomiting  . Aspirin Nausea And Vomiting    Gastrointestinal upset  . Azithromycin Palpitations  . Clarithromycin Nausea Only and Anxiety  . Codeine Itching and Rash  . Milk-Related Compounds Nausea And Vomiting  . Seroquel [Quetiapine Fumarate] Palpitations    Heart racing   . Sulfamethoxazole Rash  . Tramadol Nausea And Vomiting     Home Medications  Prior to Admission medications   Medication Sig Start Date End Date Taking? Authorizing Provider  albuterol (VENTOLIN HFA) 108 (90 Base) MCG/ACT inhaler Inhale 1-2 puffs into the lungs every 6 (six) hours as needed for wheezing or shortness of breath. 07/13/19   Avegno, Darrelyn Hillock, FNP  ALPRAZOLAM XR 3 MG 24 hr tablet Take 3 mg by mouth at bedtime. 06/30/16   [provider]  anti-nausea (EMETROL) solution Take 15 mLs by mouth every 15 (fifteen) minutes as needed for nausea or vomiting.    [provider]  azithromycin (ZITHROMAX) 250 MG tablet Take 1 tablet (250 mg total) by mouth daily. Take first 2 tablets together, then 1 every day until finished. 07/13/19   Avegno, Darrelyn Hillock, FNP  benzonatate (TESSALON) 100 MG capsule Take 1 capsule (100 mg total) by mouth every 8 (eight) hours. 07/13/19   Avegno, Darrelyn Hillock, FNP  BIOTIN PO Take 1 tablet by mouth daily.    [provider]  bismuth subsalicylate  (PEPTO BISMOL) 262 MG/15ML suspension Take 30 mLs by mouth daily as needed for indigestion.     [provider]  calcium carbonate (OS-CAL) 600 MG TABS tablet Take 1 tablet by mouth daily.    [provider]  Cholecalciferol (D2000 ULTRA STRENGTH) 50 MCG (2000 UT) CAPS Take 1 capsule by mouth daily.    [provider]  diazepam (VALIUM) 10 MG tablet Take 1 tablet by mouth as needed. 04/13/19   [provider]  furosemide (LASIX) 20 MG tablet Take 20 mg by mouth daily.     [provider]  GARLIC PO Take 1 capsule by mouth daily.    [provider]  HYDROcodone-acetaminophen (  NORCO) 7.5-325 MG tablet Take 1-2 tablets by mouth every 4 (four) hours as needed for severe pain (pain score 7-10). 11/08/18   Nicholes Stairs, MD  ibuprofen (ADVIL) 200 MG tablet Take 200 mg by mouth daily as needed for headache or moderate pain.     [provider]  loperamide (IMODIUM) 1 MG/5ML solution Take 2 mg by mouth as needed for diarrhea or loose stools.    [provider]  metoprolol succinate (TOPROL-XL) 50 MG 24 hr tablet Take 50 mg by mouth at bedtime.  08/23/16   [provider]  Multiple Vitamin (MULTIVITAMIN WITH MINERALS) TABS tablet Take 1 tablet by mouth daily.    [provider]  omeprazole (PRILOSEC) 40 MG capsule Take 40 mg by mouth daily. 12/22/18   [provider]  Polyvinyl Alcohol-Povidone (REFRESH OP) Place 1 drop into both eyes at bedtime.    [provider]  sucralfate (CARAFATE) 1 g tablet Take 1 tablet (1 g total) by mouth 4 (four) times daily -  with meals and at bedtime. 12/06/17   Carmin Muskrat, MD  triazolam (HALCION) 0.25 MG tablet Take 0.25 mg by mouth at bedtime as needed. Patient states she is not taking this medication 05/02/19   [provider]     Critical care time: 35 minutes

## 2019-07-26 NOTE — ED Provider Notes (Signed)
[facility] EMERGENCY DEPARTMENT Provider Note   CSN: [id number] Arrival date & time: 07/26/19  [id number]  An emergency department physician performed an initial assessment on this suspected stroke patient at [place].  History Chief Complaint  Patient presents with  . Code Stroke    Kylie Tucker is a 69 y.o. female.  Patient presents to the emergency department for evaluation of possible stroke.  Patient has a history of dementia and therefore cannot provide much information.  Information obtained from patient's husband.  He reports that they went to bed around midnight and she was in her normal state of health.  She woke him up at 430 this morning and told him that she was hungry.  She was able to get up, get dressed, put on her make-up and walked to the kitchen.  He went outside to start the car to warm it up and when he came back and she was slumped over.  She was unable to talk, only stating "okay" over and over again.  Husband called EMS.  They have identified a right sided deficit during transport.        Past Medical History:  Diagnosis Date  . Allergy   . Anxiety   . Bilateral ovarian cysts    Do yearly ultrasound.  . Cancer ([place]) rt breast   2004/ chemo/ radiation  . Cellulitis and abscess of upper arm and forearm   . Chronic combined systolic and diastolic CHF (congestive heart failure) ([place])   . Dementia ([place])   . Family history of breast cancer   . Family history of rectal cancer   . GERD (gastroesophageal reflux disease)   . H/O Helicobacter infection 2017  . History of right mastectomy 2004   chemo/ radiation  . Hyperlipidemia   . Hypertension   . Insomnia   . Left bundle branch block 2016  . Migraines   . Moderate mitral regurgitation   . NICM (nonischemic cardiomyopathy) ([place])   . Pneumonia   . PONV (postoperative nausea and vomiting)    cannot take zofran  . Postmastectomy lymphedema rt side  . Sentinel node     Patient Active Problem List   Diagnosis Date Noted  . Displaced transverse fracture of right patella, initial encounter for closed fracture 11/07/2018  . Right patella fracture 11/07/2018  . Malignant neoplasm of lower-outer quadrant of right breast of female, estrogen receptor positive ([place]) 05/09/2018  . Elevated troponin 04/06/2018  . Hallucinations 04/06/2018  . Agitation 04/06/2018  . HTN (hypertension) 04/06/2018  . Bilateral ovarian cysts 05/20/2017  . Hyponatremia 11/28/2016  . N&V (nausea and vomiting) 11/28/2016  . Abdominal pain 11/28/2016  . Sleep talking 07/22/2016  . Genetic testing 06/01/2016  . Family history of breast cancer   . Family history of rectal cancer   . Chest pain 09/23/2015  . Abnormal vision 06/13/2015  . Anxiety disorder 06/13/2015  . Cataract cortical, senile 06/13/2015  . Clinical depression 06/13/2015  . Cephalalgia 06/13/2015  . H/O malignant neoplasm of breast 06/13/2015  . 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 Sinus infection 06/13/2015  . Long term current use of systemic steroids 06/13/2015  . Chronic systolic CHF (congestive heart failure), NYHA class 1 ([place]) 05/09/2015  . LBBB (left bundle branch block) 03/26/2015  . Lymphedema of arm 12/13/2013  . Complete rupture of rotator cuff 11/22/2013  . Nipple discharge in female 12/01/2012  . History of breast cancer 11/30/2012  . Epiphora 11/28/2012  . Disorder of endocrine system 04/25/2012  .  Ceratitis 04/25/2012  . Blepharitis 04/25/2012  . Breast cancer, right breast ([place])   . FATIGUE 10/01/2009  . 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 Dyspnea 10/01/2009  . HYPERLIPIDEMIA 09/30/2009    Past Surgical History:  Procedure Laterality Date  . BREAST EXCISIONAL BIOPSY Left    benign  . CARPAL TUNNEL RELEASE    . CATARACT EXTRACTION Left 07/12/2013  . MANDIBLE SURGERY  1991  . MASTECTOMY Right    malignant  . NASAL SINUS SURGERY  1990/2001  . ORIF FINGER FRACTURE  02/14/2012   Procedure: OPEN REDUCTION INTERNAL FIXATION (ORIF)  METACARPAL (FINGER) FRACTURE;  Surgeon: Wynonia Sours, MD;  Location: [facility];  Service: Orthopedics;  Laterality: Right;  RIGHT FIFTH   . ORIF METACARPAL FRACTURE  8/13   rt   . ORIF PATELLA Right 11/07/2018   Procedure: OPEN REDUCTION INTERNAL (ORIF) FIXATION PATELLA;  Surgeon: Nicholes Stairs, MD;  Location: [facility];  Service: Orthopedics;  Laterality: Right;  . ORIF PATELLA FRACTURE Right 11/07/2018  . PORT-A-CATH REMOVAL     in and now out  . REPAIR EXTENSOR TENDON Left 05/30/2013   Procedure: RELEASE TRANSPOSITION EXTENSOR POLLICUS LONGUS LEFT WRIST;  Surgeon: Wynonia Sours, MD;  Location: [facility];  Service: Orthopedics;  Laterality: Left;  . rt mastectomy  2004   lymph nodes-7-axillary node dissection  . SHOULDER ARTHROSCOPY WITH ROTATOR CUFF REPAIR AND SUBACROMIAL DECOMPRESSION Left 11/22/2013   Procedure: LEFT SHOULDER ARTHROSCOPY WITH SUBACROMIAL DECOMPRESSION DISTAL CLAVICLE RESECTION REPAIR  ROTATOR CUFF AS NEEDED ;  Surgeon: Cammie Sickle., MD;  Location: [facility];  Service: Orthopedics;  Laterality: Left;  . WRIST FRACTURE SURGERY       OB History    Gravida  3   Para  2   Term      Preterm      AB  1   Living  2     SAB  0   TAB  1   Ectopic      Multiple      Live Births              Family History  Problem Relation Age of Onset  . Heart disease Father   . Heart disease Mother   . Hypertension Mother   . Rectal cancer Mother        dx in her early 14s  . Colon polyps Mother   . Breast cancer Maternal Aunt        dx in her early 76s  . Heart disease Paternal Uncle   . Heart disease Paternal Grandmother   . Heart disease Paternal Grandfather   . Diabetes Brother   . Breast cancer Maternal Grandmother        dx in her 41s  . Breast cancer Cousin        dx in early 2s  . Breast cancer Cousin 77  . Hepatitis C Son   . Hypertension Son   . Post-traumatic stress disorder Son      Social History   Tobacco Use  . Smoking status: Never Smoker  . Smokeless tobacco: Never Used  Substance Use Topics  . Alcohol use: No    Alcohol/week: 0.0 standard drinks    Comment: rarely  . Drug use: No    Home Medications Prior to Admission medications   Medication Sig Start Date End Date Taking? Authorizing Provider  albuterol (VENTOLIN HFA) 108 (90 Base) MCG/ACT inhaler Inhale 1-2  puffs into the lungs every 6 (six) hours as needed for wheezing or shortness of breath. 07/13/19   Avegno, Darrelyn Hillock, FNP  ALPRAZOLAM XR 3 MG 24 hr tablet Take 3 mg by mouth at bedtime. 06/30/16   [provider]  anti-nausea (EMETROL) solution Take 15 mLs by mouth every 15 (fifteen) minutes as needed for nausea or vomiting.    [provider]  azithromycin (ZITHROMAX) 250 MG tablet Take 1 tablet (250 mg total) by mouth daily. Take first 2 tablets together, then 1 every day until finished. 07/13/19   Avegno, Darrelyn Hillock, FNP  benzonatate (TESSALON) 100 MG capsule Take 1 capsule (100 mg total) by mouth every 8 (eight) hours. 07/13/19   Avegno, Darrelyn Hillock, FNP  BIOTIN PO Take 1 tablet by mouth daily.    [provider]  bismuth subsalicylate (PEPTO BISMOL) 262 MG/15ML suspension Take 30 mLs by mouth daily as needed for indigestion.     [provider]  calcium carbonate (OS-CAL) 600 MG TABS tablet Take 1 tablet by mouth daily.    [provider]  Cholecalciferol (D2000 ULTRA STRENGTH) 50 MCG (2000 UT) CAPS Take 1 capsule by mouth daily.    [provider]  diazepam (VALIUM) 10 MG tablet Take 1 tablet by mouth as needed. 04/13/19   [provider]  furosemide (LASIX) 20 MG tablet Take 20 mg by mouth daily.     [provider]  GARLIC PO Take 1 capsule by mouth daily.    [provider]  HYDROcodone-acetaminophen (NORCO) 7.5-325 MG tablet Take 1-2 tablets by mouth every 4 (four) hours as needed for severe pain (pain score 7-10).  11/08/18   Nicholes Stairs, MD  ibuprofen (ADVIL) 200 MG tablet Take 200 mg by mouth daily as needed for headache or moderate pain.     [provider]  loperamide (IMODIUM) 1 MG/5ML solution Take 2 mg by mouth as needed for diarrhea or loose stools.    [provider]  metoprolol succinate (TOPROL-XL) 50 MG 24 hr tablet Take 50 mg by mouth at bedtime.  08/23/16   [provider]  Multiple Vitamin (MULTIVITAMIN WITH MINERALS) TABS tablet Take 1 tablet by mouth daily.    [provider]  omeprazole (PRILOSEC) 40 MG capsule Take 40 mg by mouth daily. 12/22/18   [provider]  Polyvinyl Alcohol-Povidone (REFRESH OP) Place 1 drop into both eyes at bedtime.    [provider]  sucralfate (CARAFATE) 1 g tablet Take 1 tablet (1 g total) by mouth 4 (four) times daily -  with meals and at bedtime. 12/06/17   Carmin Muskrat, MD  triazolam (HALCION) 0.25 MG tablet Take 0.25 mg by mouth at bedtime as needed. Patient states she is not taking this medication 05/02/19   [provider]    Allergies    Clonazepam, Hydrocodone-acetaminophen, Rosuvastatin, Trazodone and nefazodone, Acetaminophen, Amoxicillin-pot clavulanate, Cefuroxime axetil, Cephalexin, Levofloxacin, Morphine and related, Ondansetron, Propoxyphene n-acetaminophen, Sulfonamide derivatives, Vioxx [rofecoxib], Aspirin, Azithromycin, Clarithromycin, Codeine, Milk-related compounds, Seroquel [quetiapine fumarate], Sulfamethoxazole, and Tramadol  Review of Systems   Review of Systems  Unable to perform ROS: Dementia  Neurological: Positive for speech difficulty and weakness.    Physical Exam Updated Vital Signs BP (!) 177/80   Pulse 75   Temp 98 F (36.7 C) (Oral)   Resp (!) 27   Wt 83.1 kg   LMP 01/10/2003   SpO2 94%   BMI 33.49 kg/m   Physical Exam Vitals and  nursing note reviewed.  Constitutional:      General: She is not in acute distress.    Appearance: Normal  appearance. She is well-developed.  HENT:     Head: Normocephalic and atraumatic.     Right Ear: Hearing normal.     Left Ear: Hearing normal.     Nose: Nose normal.  Eyes:     Conjunctiva/sclera: Conjunctivae normal.     Pupils: Pupils are equal, round, and reactive to light.  Cardiovascular:     Rate and Rhythm: Regular rhythm.     Heart sounds: S1 normal and S2 normal. No murmur. No friction rub. No gallop.   Pulmonary:     Effort: Pulmonary effort is normal. No respiratory distress.     Breath sounds: Normal breath sounds.  Chest:     Chest wall: No tenderness.  Abdominal:     General: Bowel sounds are normal.     Palpations: Abdomen is soft.     Tenderness: There is no abdominal tenderness. There is no guarding or rebound. Negative signs include Murphy's sign and McBurney's sign.     Hernia: No hernia is present.  Musculoskeletal:        General: Normal range of motion.     Cervical back: Normal range of motion and neck supple.  Skin:    General: Skin is warm and dry.     Findings: No rash.  Neurological:     Mental Status: She is alert.     GCS: GCS eye subscore is 4. GCS verbal subscore is 4. GCS motor subscore is 6.     Coordination: Coordination normal.     Comments: Difficult to evaluate sensation, patient does not answer questions appropriately.  Patient has 0 out of 5 right lower extremity strength, 4 out of 5 left  Possible slight drift of right upper extremity when compared to left    Psychiatric:        Speech: Speech normal.        Behavior: Behavior normal.        Thought Content: Thought content normal.     ED Results / Procedures / Treatments   Labs (all labs ordered are listed, but only abnormal results are displayed) Labs Reviewed  CBC - Abnormal; Notable for the following components:      Result Value   HCT 46.7 (*)    All other components within normal limits  COMPREHENSIVE METABOLIC PANEL - Abnormal; Notable for the following components:     Glucose, Bld 132 (*)    Creatinine, Ser 1.18 (*)    Albumin 3.3 (*)    GFR calc non Af Amer 47 (*)    GFR calc Af Amer 55 (*)    All other components within normal limits  CBG MONITORING, ED - Abnormal; Notable for the following components:   Glucose-Capillary 119 (*)    All other components within normal limits  RESPIRATORY PANEL BY RT PCR (FLU A&B, COVID)  ETHANOL  PROTIME-INR  APTT  DIFFERENTIAL  RAPID URINE DRUG SCREEN, HOSP PERFORMED  URINALYSIS, ROUTINE W REFLEX MICROSCOPIC  I-STAT CHEM 8, ED    EKG None  Radiology CT HEAD CODE STROKE WO CONTRAST  Result Date: 07/26/2019 CLINICAL DATA:  Code stroke.  Sudden onset weakness EXAM: CT HEAD WITHOUT CONTRAST TECHNIQUE: Contiguous axial images were obtained from the base of the skull through the vertex without intravenous contrast. COMPARISON:  04/06/2018 FINDINGS: Brain: No evidence of acute infarction, hemorrhage, hydrocephalus, extra-axial collection or mass  lesion/mass effect. Vascular: No hyperdense vessel or unexpected calcification. Skull: Normal. Negative for fracture or focal lesion. Sinuses/Orbits: No acute finding. Other: Attempted call report. ASPECTS [facility] Stroke Program Early CT Score) Not scored without laterality. IMPRESSION: Negative head CT. Electronically Signed   By: Monte Fantasia M.D.   On: 07/26/2019 05:21    Procedures Procedures (including critical care time)  Medications Ordered in ED Medications  alteplase (ACTIVASE) 1 mg/mL infusion 74.8 mg (has no administration in time range)    Followed by  0.9 %  sodium chloride infusion (has no administration in time range)  labetalol (NORMODYNE) injection 20 mg (has no administration in time range)    And  nicardipine (CARDENE) 20mg  in 0.86% saline 247ml IV infusion (0.1 mg/ml) (has no administration in time range)    ED Course  I have reviewed the triage vital signs and the nursing notes.  Pertinent labs & imaging results that were available during  my care of the patient were reviewed by me and considered in my medical decision making (see chart for details).    MDM Rules/Calculators/A&P                      Patient arrives in the emergency department with presentation concerning for possible stroke versus seizure.  Last known normal was approximately 30 minutes before arrival in the ER.  Code stroke was initiated.  Head CT does not show any acute abnormality.  She does not have any history of previous stroke or seizure.  She does have history of breast cancer, no mass-effect seen on CT.  Patient evaluated by teleneurology.  Thrombolytics have been recommended.  Neurology did discuss risks and benefits with husband and he consented to thrombolytics.  Patient to be transferred to [facility] stroke service.  CRITICAL CARE Performed by: Orpah Greek   Total critical care time: 35 minutes  Critical care time was exclusive of separately billable procedures and treating other patients.  Critical care was necessary to treat or prevent imminent or life-threatening deterioration.  Critical care was time spent personally by me on the following activities: development of treatment plan with patient and/or surrogate as well as nursing, discussions with consultants, evaluation of patient's response to treatment, examination of patient, obtaining history from patient or surrogate, ordering and performing treatments and interventions, ordering and review of laboratory studies, ordering and review of radiographic studies, pulse oximetry and re-evaluation of patient's condition.   Final Clinical Impression(s) / ED Diagnoses Final diagnoses:  Cerebrovascular accident (CVA), unspecified mechanism ([place])    Rx / DC Orders ED Discharge Orders    None       Abijah Roussel, Gwenyth Allegra, MD 07/26/19 [phone]

## 2019-07-26 NOTE — ED Provider Notes (Addendum)
  Physical Exam  BP 140/72   Pulse 77   Temp 98.1 F (36.7 C)   Resp (!) 22   Wt 83.1 kg   LMP 01/10/2003   SpO2 96%   BMI 33.49 kg/m   Physical Exam  ED Course/Procedures     Procedures  MDM  Received patient in signout.  Stroke.  Improved deficits but does have an A 2 occlusion With 20 cc of penumbra.  Discussed with Dr. Cheral Marker who also discussed with Dr. Patrecia Pour.  Will transfer ER to ER and potentially get neuro interventional.      Davonna Belling, MD 07/26/19 [phone]  Consent for neuro intervention has been signed.  Dr. Cheral Marker discussed with the patient and her husband over speaker phone and discussed the risks and benefits.    Davonna Belling, MD 07/26/19 [phone]

## 2019-07-27 ENCOUNTER — Inpatient Hospital Stay (HOSPITAL_COMMUNITY): Payer: PPO

## 2019-07-27 ENCOUNTER — Other Ambulatory Visit: Payer: Self-pay

## 2019-07-27 DIAGNOSIS — I63522 Cerebral infarction due to unspecified occlusion or stenosis of left anterior cerebral artery: Secondary | ICD-10-CM

## 2019-07-27 DIAGNOSIS — I255 Ischemic cardiomyopathy: Secondary | ICD-10-CM

## 2019-07-27 DIAGNOSIS — E7800 Pure hypercholesterolemia, unspecified: Secondary | ICD-10-CM

## 2019-07-27 DIAGNOSIS — I5041 Acute combined systolic (congestive) and diastolic (congestive) heart failure: Secondary | ICD-10-CM

## 2019-07-27 DIAGNOSIS — I10 Essential (primary) hypertension: Secondary | ICD-10-CM

## 2019-07-27 LAB — CBC
HCT: 40.5 % (ref 36.0–46.0)
Hemoglobin: 13 g/dL (ref 12.0–15.0)
MCH: 29.5 pg (ref 26.0–34.0)
MCHC: 32.1 g/dL (ref 30.0–36.0)
MCV: 92 fL (ref 80.0–100.0)
Platelets: 234 10*3/uL (ref 150–400)
RBC: 4.4 MIL/uL (ref 3.87–5.11)
RDW: 15.3 % (ref 11.5–15.5)
WBC: 10.4 10*3/uL (ref 4.0–10.5)
nRBC: 0 % (ref 0.0–0.2)

## 2019-07-27 LAB — BASIC METABOLIC PANEL
Anion gap: 10 (ref 5–15)
BUN: 12 mg/dL (ref 8–23)
CO2: 23 mmol/L (ref 22–32)
Calcium: 8.3 mg/dL — ABNORMAL LOW (ref 8.9–10.3)
Chloride: 108 mmol/L (ref 98–111)
Creatinine, Ser: 1.07 mg/dL — ABNORMAL HIGH (ref 0.44–1.00)
GFR calc Af Amer: >60 mL/min (ref >60)
GFR calc non Af Amer: 53 mL/min — ABNORMAL LOW (ref >60)
Glucose, Bld: 86 mg/dL (ref 70–99)
Potassium: 3.8 mmol/L (ref 3.5–5.1)
Sodium: 141 mmol/L (ref 135–145)

## 2019-07-27 LAB — LIPID PANEL
Cholesterol: 159 mg/dL (ref 0–200)
HDL: 35 mg/dL — ABNORMAL LOW (ref >40)
LDL Cholesterol: 100 mg/dL — ABNORMAL HIGH (ref 0–99)
Total CHOL/HDL Ratio: 4.5 RATIO
Triglycerides: 120 mg/dL (ref <150)
VLDL: 24 mg/dL (ref 0–40)

## 2019-07-27 LAB — HEMOGLOBIN A1C
Hgb A1c MFr Bld: 6.2 % — ABNORMAL HIGH (ref 4.8–5.6)
Mean Plasma Glucose: 131.24 mg/dL

## 2019-07-27 LAB — GLUCOSE, CAPILLARY
Glucose-Capillary: 84 mg/dL (ref 70–99)
Glucose-Capillary: 90 mg/dL (ref 70–99)
Glucose-Capillary: 89 mg/dL (ref 70–99)
Glucose-Capillary: 130 mg/dL — ABNORMAL HIGH (ref 70–99)

## 2019-07-27 MED ORDER — ENOXAPARIN SODIUM 40 MG/0.4ML ~~LOC~~ SOLN
40.0000 mg | SUBCUTANEOUS | Status: DC
  Administered 2019-07-27 – 2019-07-30 (×4): 40 mg via SUBCUTANEOUS
  Filled 2019-07-27 (×4): qty 0.4

## 2019-07-27 MED ORDER — ALPRAZOLAM 0.5 MG PO TABS
3.0000 mg | ORAL_TABLET | Freq: Every day | ORAL | Status: DC
  Administered 2019-07-27 – 2019-07-31 (×5): 3 mg via ORAL
  Filled 2019-07-27 (×5): qty 6

## 2019-07-27 MED ORDER — PANTOPRAZOLE SODIUM 40 MG PO TBEC
40.0000 mg | DELAYED_RELEASE_TABLET | Freq: Every day | ORAL | Status: DC
  Administered 2019-07-27 – 2019-08-01 (×6): 40 mg via ORAL
  Filled 2019-07-27 (×6): qty 1

## 2019-07-27 MED ORDER — PRAVASTATIN SODIUM 40 MG PO TABS
40.0000 mg | ORAL_TABLET | Freq: Every day | ORAL | Status: DC
  Filled 2019-07-27: qty 1

## 2019-07-27 MED ORDER — FUROSEMIDE 20 MG PO TABS
20.0000 mg | ORAL_TABLET | Freq: Every day | ORAL | Status: DC
  Administered 2019-07-28 – 2019-08-01 (×5): 20 mg via ORAL
  Filled 2019-07-27 (×5): qty 1

## 2019-07-27 MED ORDER — AMLODIPINE BESYLATE 5 MG PO TABS
5.0000 mg | ORAL_TABLET | Freq: Every day | ORAL | Status: DC
  Administered 2019-07-28 – 2019-08-01 (×5): 5 mg via ORAL
  Filled 2019-07-27 (×4): qty 1

## 2019-07-27 MED ORDER — CLOPIDOGREL BISULFATE 75 MG PO TABS
75.0000 mg | ORAL_TABLET | Freq: Every day | ORAL | Status: DC
  Administered 2019-07-27 – 2019-08-01 (×6): 75 mg via ORAL
  Filled 2019-07-27 (×6): qty 1

## 2019-07-27 NOTE — Progress Notes (Signed)
Referring Physician(s): Code Stroke- Kerney Elbe  Supervising Physician: Luanne Bras  Patient Status:  [facility] - In-pt  Chief Complaint: None  Subjective:  Acute CVA s/p cerebral arteriogram with emergent mechanical thrombectomy of left ACA A2 segment occlusion achieving a TICI 3 revascularization 07/26/2019 by Dr. Estanislado Pandy. Patient awake and alert laying in bed with no complaints at this time. IR techs at bedside currently removing right groin sheath. Can spontaneously move all extremities. Right groin incision c/d/i.   Allergies: Clonazepam, Hydrocodone-acetaminophen, Rosuvastatin, Trazodone and nefazodone, Acetaminophen, Amoxicillin-pot clavulanate, Cefuroxime axetil, Cephalexin, Levofloxacin, Morphine and related, Ondansetron, Propoxyphene n-acetaminophen, Sulfonamide derivatives, Vioxx [rofecoxib], Aspirin, Azithromycin, Clarithromycin, Codeine, Milk-related compounds, Seroquel [quetiapine fumarate], Sulfamethoxazole, and Tramadol  Medications: Prior to Admission medications   Medication Sig Start Date End Date Taking? Authorizing Provider  albuterol (VENTOLIN HFA) 108 (90 Base) MCG/ACT inhaler Inhale 1-2 puffs into the lungs every 6 (six) hours as needed for wheezing or shortness of breath. 07/13/19   Avegno, Darrelyn Hillock, FNP  ALPRAZOLAM XR 3 MG 24 hr tablet Take 3 mg by mouth at bedtime. 06/30/16   [provider]  anti-nausea (EMETROL) solution Take 15 mLs by mouth every 15 (fifteen) minutes as needed for nausea or vomiting.    [provider]  azithromycin (ZITHROMAX) 250 MG tablet Take 1 tablet (250 mg total) by mouth daily. Take first 2 tablets together, then 1 every day until finished. 07/13/19   Avegno, Darrelyn Hillock, FNP  benzonatate (TESSALON) 100 MG capsule Take 1 capsule (100 mg total) by mouth every 8 (eight) hours. 07/13/19   Avegno, Darrelyn Hillock, FNP  BIOTIN PO Take 1 tablet by mouth daily.    [provider]  bismuth subsalicylate (PEPTO  BISMOL) 262 MG/15ML suspension Take 30 mLs by mouth daily as needed for indigestion.     [provider]  calcium carbonate (OS-CAL) 600 MG TABS tablet Take 1 tablet by mouth daily.    [provider]  Cholecalciferol (D2000 ULTRA STRENGTH) 50 MCG (2000 UT) CAPS Take 1 capsule by mouth daily.    [provider]  diazepam (VALIUM) 10 MG tablet Take 1 tablet by mouth as needed. 04/13/19   [provider]  furosemide (LASIX) 20 MG tablet Take 20 mg by mouth daily.     [provider]  GARLIC PO Take 1 capsule by mouth daily.    [provider]  HYDROcodone-acetaminophen (NORCO) 7.5-325 MG tablet Take 1-2 tablets by mouth every 4 (four) hours as needed for severe pain (pain score 7-10). 11/08/18   Nicholes Stairs, MD  ibuprofen (ADVIL) 200 MG tablet Take 200 mg by mouth daily as needed for headache or moderate pain.     [provider]  loperamide (IMODIUM) 1 MG/5ML solution Take 2 mg by mouth as needed for diarrhea or loose stools.    [provider]  metoprolol succinate (TOPROL-XL) 50 MG 24 hr tablet Take 50 mg by mouth at bedtime.  08/23/16   [provider]  Multiple Vitamin (MULTIVITAMIN WITH MINERALS) TABS tablet Take 1 tablet by mouth daily.    [provider]  omeprazole (PRILOSEC) 40 MG capsule Take 40 mg by mouth daily. 12/22/18   [provider]  Polyvinyl Alcohol-Povidone (REFRESH OP) Place 1 drop into both eyes at bedtime.    [provider]  sucralfate (CARAFATE) 1 g tablet Take 1 tablet (1 g total) by mouth 4 (four) times daily -  with meals and at bedtime. 12/06/17   Vanita Panda,  Herbie Baltimore, MD  triazolam (HALCION) 0.25 MG tablet Take 0.25 mg by mouth at bedtime as needed. Patient states she is not taking this medication 05/02/19   [provider]     Vital Signs: BP (!) 147/74 Comment: gtt titrated  Pulse 77   Temp 97.8 F (36.6 C) (Oral)   Resp (!) 21   Wt 183 lb 2 oz  (83.1 kg)   LMP 01/10/2003   SpO2 93%   BMI 33.49 kg/m   Physical Exam Vitals and nursing note reviewed.  Constitutional:      General: She is not in acute distress.    Appearance: Normal appearance.  Pulmonary:     Effort: Pulmonary effort is normal. No respiratory distress.  Skin:    General: Skin is warm and dry.     Comments: Right groin incision soft without active bleeding or hematoma- IR tech currently holding pressure on groin.  Neurological:     Mental Status: She is alert.     Comments: Alert, awake, and oriented x3. Speech and comprehension intact. PERRL bilaterally. No facial asymmetry. Tongue midline. Can spontaneously move all extremities. No pronator drift. Distal pulses palpable bilaterally with Doppler.  Psychiatric:        Mood and Affect: Mood normal.        Behavior: Behavior normal.     Imaging: CT ANGIO HEAD W OR WO CONTRAST  Result Date: 07/26/2019 CLINICAL DATA:  In attention and right leg weakness, sudden onset. EXAM: CT ANGIOGRAPHY HEAD AND NECK CT PERFUSION BRAIN TECHNIQUE: Multidetector CT imaging of the head and neck was performed using the standard protocol during bolus administration of intravenous contrast. Multiplanar CT image reconstructions and MIPs were obtained to evaluate the vascular anatomy. Carotid stenosis measurements (when applicable) are obtained utilizing NASCET criteria, using the distal internal carotid diameter as the denominator. Multiphase CT imaging of the brain was performed following IV bolus contrast injection. Subsequent parametric perfusion maps were calculated using RAPID software. CONTRAST:  176mL OMNIPAQUE IOHEXOL 350 MG/ML SOLN COMPARISON:  Noncontrast head CT earlier today FINDINGS: CTA NECK FINDINGS Aortic arch: Mild atheromatous changes. Three vessel branching. No dilatation. Right carotid system: No noted atheromatous changes. ICA tortuosity with loop. Left carotid system: Smaller vessel size to circle-of-Willis  variant. ICA looping at the skull base. No noted atheromatous changes, stenosis, or ulceration. Vertebral arteries: No proximal subclavian stenosis or atherosclerosis. Mild left vertebral artery dominance. Both vertebral arteries are smooth and widely patent to the dura. Skeleton: Cervical spine degeneration and bilateral mandibular osteoplasty Other neck: No acute finding Upper chest: Layering pleural effusions. Cardiomegaly on preceding x-ray. Review of the MIP images confirms the above findings CTA HEAD FINDINGS Anterior circulation: Aplastic left A1 segment. Left A2 short segment occlusion with distal reconstitution, acute appearing and likely embolic. Negative for aneurysm Posterior circulation: Smooth vertebral and basilar arteries. No PCA branch occlusion. Venous sinuses: Negative for aneurysm. Anatomic variants: As above Review of the MIP images confirms the above findings CT Brain Perfusion Findings: ASPECTS: 10 CBF (<30%) Volume: 24mL Perfusion (Tmax>6.0s) volume: 19mL Mismatch Volume: 26mL Infarction Location:Left frontal These results were called by telephone at the time of interpretation on 07/26/2019 at 7:49 am to provider Pollina , who verbally acknowledged these results. IMPRESSION: 1. Left A2 embolism with short segment occlusion. There is an associated 8 cc core infarct and 22 cc of penumbra. 2. Minimal atheromatous changes. There is however cardiomegaly and layering pleural effusions, question cardiac source. 3. Bilateral ICA looping in the neck.  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 Electronically Signed   By: Monte Fantasia M.D.   On: 07/26/2019 07:53   CT HEAD WO CONTRAST  Result Date: 07/27/2019 CLINICAL DATA:  Stroke, follow-up. Noncontrast CT head, CT angiogram head/neck and CT perfusion 07/26/2019 EXAM: CT HEAD WITHOUT CONTRAST TECHNIQUE: Contiguous axial images were obtained from the base of the skull through the vertex without intravenous contrast. COMPARISON:  Noncontrast CT head, CT angiogram  head/neck and CT perfusion 07/26/2019 FINDINGS: Brain: There is loss of gray-white differentiation within portions of the anterior left frontal lobe, left ACA vascular territory, consistent with acute infarction (series 3, images 17-22) (series 5, images 15-21). No evidence of hemorrhagic conversion. No significant mass effect. No midline shift. No evidence of intracranial mass. No extra-axial fluid collection. Vascular: Hyperdense vessel. Skull: Normal. Negative for fracture or focal lesion. Sinuses/Orbits: Visualized orbits demonstrate no acute abnormality. Postsurgical appearance of the paranasal sinuses. No significant paranasal sinus disease or mastoid effusion at the imaged levels. IMPRESSION: Acute left frontal lobe ACA territory ischemic infarct. No hemorrhagic conversion. No significant mass effect. Electronically Signed   By: Kellie Simmering DO   On: 07/27/2019 07:41   CT ANGIO NECK W OR WO CONTRAST  Result Date: 07/26/2019 CLINICAL DATA:  In attention and right leg weakness, sudden onset. EXAM: CT ANGIOGRAPHY HEAD AND NECK CT PERFUSION BRAIN TECHNIQUE: Multidetector CT imaging of the head and neck was performed using the standard protocol during bolus administration of intravenous contrast. Multiplanar CT image reconstructions and MIPs were obtained to evaluate the vascular anatomy. Carotid stenosis measurements (when applicable) are obtained utilizing NASCET criteria, using the distal internal carotid diameter as the denominator. Multiphase CT imaging of the brain was performed following IV bolus contrast injection. Subsequent parametric perfusion maps were calculated using RAPID software. CONTRAST:  112mL OMNIPAQUE IOHEXOL 350 MG/ML SOLN COMPARISON:  Noncontrast head CT earlier today FINDINGS: CTA NECK FINDINGS Aortic arch: Mild atheromatous changes. Three vessel branching. No dilatation. Right carotid system: No noted atheromatous changes. ICA tortuosity with loop. Left carotid system: Smaller vessel  size to circle-of-Willis variant. ICA looping at the skull base. No noted atheromatous changes, stenosis, or ulceration. Vertebral arteries: No proximal subclavian stenosis or atherosclerosis. Mild left vertebral artery dominance. Both vertebral arteries are smooth and widely patent to the dura. Skeleton: Cervical spine degeneration and bilateral mandibular osteoplasty Other neck: No acute finding Upper chest: Layering pleural effusions. Cardiomegaly on preceding x-ray. Review of the MIP images confirms the above findings CTA HEAD FINDINGS Anterior circulation: Aplastic left A1 segment. Left A2 short segment occlusion with distal reconstitution, acute appearing and likely embolic. Negative for aneurysm Posterior circulation: Smooth vertebral and basilar arteries. No PCA branch occlusion. Venous sinuses: Negative for aneurysm. Anatomic variants: As above Review of the MIP images confirms the above findings CT Brain Perfusion Findings: ASPECTS: 10 CBF (<30%) Volume: 49mL Perfusion (Tmax>6.0s) volume: 50mL Mismatch Volume: 48mL Infarction Location:Left frontal These results were called by telephone at the time of interpretation on 07/26/2019 at 7:49 am to provider Pollina , who verbally acknowledged these results. IMPRESSION: 1. Left A2 embolism with short segment occlusion. There is an associated 8 cc core infarct and 22 cc of penumbra. 2. Minimal atheromatous changes. There is however cardiomegaly and layering pleural effusions, question cardiac source. 3. Bilateral ICA looping in the neck.  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 Electronically Signed   By: Monte Fantasia M.D.   On: 07/26/2019 07:53   CT CEREBRAL PERFUSION W CONTRAST  Result Date: 07/26/2019 CLINICAL  DATA:  In attention and right leg weakness, sudden onset. EXAM: CT ANGIOGRAPHY HEAD AND NECK CT PERFUSION BRAIN TECHNIQUE: Multidetector CT imaging of the head and neck was performed using the standard protocol during bolus administration of intravenous contrast.  Multiplanar CT image reconstructions and MIPs were obtained to evaluate the vascular anatomy. Carotid stenosis measurements (when applicable) are obtained utilizing NASCET criteria, using the distal internal carotid diameter as the denominator. Multiphase CT imaging of the brain was performed following IV bolus contrast injection. Subsequent parametric perfusion maps were calculated using RAPID software. CONTRAST:  120mL OMNIPAQUE IOHEXOL 350 MG/ML SOLN COMPARISON:  Noncontrast head CT earlier today FINDINGS: CTA NECK FINDINGS Aortic arch: Mild atheromatous changes. Three vessel branching. No dilatation. Right carotid system: No noted atheromatous changes. ICA tortuosity with loop. Left carotid system: Smaller vessel size to circle-of-Willis variant. ICA looping at the skull base. No noted atheromatous changes, stenosis, or ulceration. Vertebral arteries: No proximal subclavian stenosis or atherosclerosis. Mild left vertebral artery dominance. Both vertebral arteries are smooth and widely patent to the dura. Skeleton: Cervical spine degeneration and bilateral mandibular osteoplasty Other neck: No acute finding Upper chest: Layering pleural effusions. Cardiomegaly on preceding x-ray. Review of the MIP images confirms the above findings CTA HEAD FINDINGS Anterior circulation: Aplastic left A1 segment. Left A2 short segment occlusion with distal reconstitution, acute appearing and likely embolic. Negative for aneurysm Posterior circulation: Smooth vertebral and basilar arteries. No PCA branch occlusion. Venous sinuses: Negative for aneurysm. Anatomic variants: As above Review of the MIP images confirms the above findings CT Brain Perfusion Findings: ASPECTS: 10 CBF (<30%) Volume: 8mL Perfusion (Tmax>6.0s) volume: 78mL Mismatch Volume: 25mL Infarction Location:Left frontal These results were called by telephone at the time of interpretation on 07/26/2019 at 7:49 am to provider Pollina , who verbally acknowledged these  results. IMPRESSION: 1. Left A2 embolism with short segment occlusion. There is an associated 8 cc core infarct and 22 cc of penumbra. 2. Minimal atheromatous changes. There is however cardiomegaly and layering pleural effusions, question cardiac source. 3. Bilateral ICA looping in the neck.  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 Electronically Signed   By: Monte Fantasia M.D.   On: 07/26/2019 07:53   ECHOCARDIOGRAM COMPLETE  Result Date: 07/26/2019   ECHOCARDIOGRAM REPORT   Patient Name:   Kylie Tucker Date of Exam: 07/26/2019 Medical Rec #:  [id number]        Height:       62.0 in Accession #:    [id number]       Weight:       183.1 lb Date of Birth:  02/06/1951        BSA:          1.84 m Patient Age:    69 years         BP:           121/51 mmHg Patient Gender: F                HR:           66 bpm. Exam Location:  Inpatient Procedure: 2D Echo, Cardiac Doppler and Color Doppler Indications:    Stroke 434.91  History:        Patient has prior history of Echocardiogram examinations, most                 recent 04/06/2018. CHF, Stroke, Arrythmias:LBBB,                 Signs/Symptoms:Chest Pain  and Dyspnea; Risk                 Factors:Hypertension, Dyslipidemia and Non-Smoker. Elevated                 troponin.  Sonographer:    Paulita Fujita RDCS Referring Phys: [place]  1. Left ventricular ejection fraction, by visual estimation, is 25 to 30%. The left ventricle has moderate to severely decreased function. Left ventricular septal wall thickness was normal. Normal left ventricular posterior wall thickness. There is no left ventricular hypertrophy.  2. The left ventricle demonstrates global hypokinesis.  3. Global right ventricle has normal systolic function.The right ventricular size is normal. No increase in right ventricular wall thickness.  4. Left atrial size was mildly dilated.  5. Right atrial size was normal.  6. Large pleural effusion in the left lateral region.  7. Small pericardial  effusion.  8. The pericardial effusion is posterior to the left ventricle.  9. The mitral valve is normal in structure. Mild mitral valve regurgitation. No evidence of mitral stenosis. 10. The tricuspid valve is normal in structure. 11. The aortic valve is tricuspid. Aortic valve regurgitation is not visualized. No evidence of aortic valve sclerosis or stenosis. 12. Pulmonic regurgitation is mild. 13. The pulmonic valve was normal in structure. Pulmonic valve regurgitation is mild. 14. Moderately elevated pulmonary artery systolic pressure. 15. The inferior vena cava is normal in size with <50% respiratory variability, suggesting right atrial pressure of 8 mmHg. FINDINGS  Left Ventricle: Left ventricular ejection fraction, by visual estimation, is 25 to 30%. The left ventricle has moderate to severely decreased function. The left ventricle demonstrates global hypokinesis. Normal left ventricular posterior wall thickness.  There is no left ventricular hypertrophy. Normal left atrial pressure. Right Ventricle: The right ventricular size is normal. No increase in right ventricular wall thickness. Global RV systolic function is has normal systolic function. The tricuspid regurgitant velocity is 3.11 m/s, and with an assumed right atrial pressure  of 8 mmHg, the estimated right ventricular systolic pressure is moderately elevated at 46.7 mmHg. Left Atrium: Left atrial size was mildly dilated. Right Atrium: Right atrial size was normal in size Pericardium: A small pericardial effusion is present. The pericardial effusion is posterior to the left ventricle. There is a large pleural effusion in the left lateral region. Mitral Valve: The mitral valve is normal in structure. Mild mitral valve regurgitation. No evidence of mitral valve stenosis by observation. Tricuspid Valve: The tricuspid valve is normal in structure. Tricuspid valve regurgitation is mild. Aortic Valve: The aortic valve is tricuspid. Aortic valve  regurgitation is not visualized. The aortic valve is structurally normal, with no evidence of sclerosis or stenosis. Pulmonic Valve: The pulmonic valve was normal in structure. Pulmonic valve regurgitation is mild. Pulmonic regurgitation is mild. Aorta: The aortic root, ascending aorta and aortic arch are all structurally normal, with no evidence of dilitation or obstruction. Venous: The inferior vena cava is normal in size with less than 50% respiratory variability, suggesting right atrial pressure of 8 mmHg. IAS/Shunts: No atrial level shunt detected by color flow Doppler. There is no evidence of a patent foramen ovale. No ventricular septal defect is seen or detected. There is no evidence of an atrial septal defect.  LEFT VENTRICLE PLAX 2D LVIDd:         4.90 cm LVIDs:         4.40 cm LV PW:         0.80 cm  LV IVS:        0.80 cm LVOT diam:     1.60 cm LV SV:         25 ml LV SV Index:   12.94 LVOT Area:     2.01 cm  LV Volumes (MOD) LV area d, A2C:    48.70 cm LV area d, A4C:    47.40 cm LV area s, A2C:    36.00 cm LV area s, A4C:    38.30 cm LV major d, A2C:   9.76 cm LV major d, A4C:   9.44 cm LV major s, A2C:   8.58 cm LV major s, A4C:   8.60 cm LV vol d, MOD A2C: 203.0 ml LV vol d, MOD A4C: 197.0 ml LV vol s, MOD A2C: 132.0 ml LV vol s, MOD A4C: 140.0 ml LV SV MOD A2C:     71.0 ml LV SV MOD A4C:     197.0 ml LV SV MOD BP:      67.8 ml RIGHT VENTRICLE RV S prime:     10.20 cm/s TAPSE (M-mode): 2.1 cm LEFT ATRIUM           Index       RIGHT ATRIUM           Index LA diam:      3.70 cm 2.01 cm/m  RA Area:     16.20 cm LA Vol (A4C): 53.6 ml 29.11 ml/m RA Volume:   43.10 ml  23.41 ml/m  AORTIC VALVE LVOT Vmax:   110.00 cm/s LVOT Vmean:  75.800 cm/s LVOT VTI:    0.237 m  AORTA Ao Root diam: 3.10 cm MITRAL VALVE                        TRICUSPID VALVE MV Area (PHT): 4.15 cm             TV Peak grad:   23.8 mmHg MV PHT:        53.07 msec           TV Vmax:        2.44 m/s MV Decel Time: 183 msec              TR Peak grad:   38.7 mmHg MR Peak grad: 90.4 mmHg             TR Vmax:        311.00 cm/s MR Vmax:      475.50 cm/s MV E velocity: 90.80 cm/s 103 cm/s  SHUNTS MV A velocity: 79.70 cm/s 70.3 cm/s Systemic VTI:  0.24 m MV E/A ratio:  1.14       1.5       Systemic Diam: 1.60 cm  Skeet Latch MD Electronically signed by Skeet Latch MD Signature Date/Time: 07/26/2019/7:27:24 PM    Final    CT HEAD CODE STROKE WO CONTRAST  Result Date: 07/26/2019 CLINICAL DATA:  Code stroke.  Sudden onset weakness EXAM: CT HEAD WITHOUT CONTRAST TECHNIQUE: Contiguous axial images were obtained from the base of the skull through the vertex without intravenous contrast. COMPARISON:  04/06/2018 FINDINGS: Brain: No evidence of acute infarction, hemorrhage, hydrocephalus, extra-axial collection or mass lesion/mass effect. Vascular: No hyperdense vessel or unexpected calcification. Skull: Normal. Negative for fracture or focal lesion. Sinuses/Orbits: No acute finding. Other: Attempted call report. ASPECTS [facility] Stroke Program Early CT Score) Not scored without laterality. IMPRESSION: Negative head CT. Electronically Signed   By: Angelica Chessman  Watts M.D.   On: 07/26/2019 05:21    Labs:  CBC: Recent Labs    12/12/18 0242 05/25/19 1001 07/26/19 0524 07/27/19 0651  WBC 7.4 7.1 7.9 10.4  HGB 14.0 14.1 14.8 13.0  HCT 43.2 43.6 46.7* 40.5  PLT 292 254 273 234    COAGS: Recent Labs    07/26/19 0524  INR 1.1  APTT 24    BMP: Recent Labs    12/12/18 0141 05/25/19 1001 07/26/19 0524 07/27/19 0651  NA 140 144 143 141  K 4.5 4.0 3.7 3.8  CL 108 105 108 108  CO2 20* 28 25 23   GLUCOSE 114* 126* 132* 86  BUN 20 20 18 12   CALCIUM 9.1 8.7* 8.9 8.3*  CREATININE 1.09* 1.10* 1.18* 1.07*  GFRNONAA 52* 52* 47* 53*  GFRAA >60 60* 55* >60    LIVER FUNCTION TESTS: Recent Labs    05/25/19 1001 07/26/19 0524  BILITOT 0.3 0.9  AST 25 34  ALT 40 30  ALKPHOS 101 64  PROT 6.5 6.5  ALBUMIN 3.5 3.3*     Assessment and Plan:  Acute CVA s/p cerebral arteriogram with emergent mechanical thrombectomy of left ACA A2 segment occlusion achieving a TICI 3 revascularization 07/26/2019 by Dr. Estanislado Pandy. Patient's condition improving no focal neurological symptoms noted, can spontaneously move all extremities. Right groin currently stable post-sheath removal- will reassess groin later this afternoon. Further plans per neurology- appreciate and agree with management. NIR to follow.   Electronically Signed: Earley Abide, PA-C 07/27/2019, 10:14 AM   I spent a total of 25 Minutes at the the patient's bedside AND on the patient's hospital floor or unit, greater than 50% of which was counseling/coordinating care for left ACA A2 segment occlusion s/p revascularization.

## 2019-07-27 NOTE — Progress Notes (Signed)
8 FR sheath pulled from right groin at 0842 and quick clot pad applied with manual pressure.  Complete hemostasis achieved at 0914. Gauze and tegaderm applied and groin site showed to [place], RN with no evidence of hematoma or complications.  Advised RN to remove quick clot within 24 hours.  Patient educated on keeping leg straight and head on pillow to prevent bleeding.  Distal pulses palpable. Dr Estanislado Pandy also at bedside.  Kylie Tucker RTR [place]

## 2019-07-27 NOTE — Progress Notes (Signed)
OT Cancellation Note  Patient Details Name: Kylie Tucker MRN: [id number] DOB: 08-08-1950   Cancelled Treatment:    Reason Eval/Treat Not Completed: Active bedrest order. Sheath just pulled.  [facility]  OTR/L Acute [employer] Pager [phone] Office [phone]      07/27/2019, 9:04 AM

## 2019-07-27 NOTE — Progress Notes (Signed)
PT Cancellation Note  Patient Details Name: Kylie Tucker MRN: [id number] DOB: 05-Aug-1950   Cancelled Treatment:    Reason Eval/Treat Not Completed: Active bedrest order Pt still on bedrest post sheath removal. Per nurse, will start elevating HOB at 15:30. Will follow.   Marguarite Arbour A Diontay Rosencrans 07/27/2019, 3:22 PM Marisa Severin, PT, DPT Acute Rehabilitation Services Pager [phone] Office [phone]

## 2019-07-27 NOTE — Progress Notes (Signed)
NIR.  Acute CVA s/p cerebral arteriogram with emergent mechanical thrombectomy of left ACA A2 segment occlusion achieving a TICI 3 revascularization 07/26/2019 by Dr. Estanislado Pandy.  Right groin incision soft without active bleeding or hematoma, distal pulses palpable bilaterally with Doppler.  Further plans per neurology- appreciate and agree with management. Please call NIR with questions/concerns.   Bea Graff Louk, PA-C 07/27/2019, 3:17 PM

## 2019-07-27 NOTE — Evaluation (Signed)
Speech Language Pathology Evaluation Patient Details Name: Kylie Tucker MRN: [id number] DOB: 1950/08/25 Today's Date: 07/27/2019 Time: [id number] SLP Time Calculation (min) (ACUTE ONLY): 17 min  Problem List:  Patient Active Problem List   Diagnosis Date Noted  . Stroke (cerebrum) ([place]) 07/26/2019  . Anterior cerebral circulation infarction, left ([place]) 07/26/2019  . Displaced transverse fracture of right patella, initial encounter for closed fracture 11/07/2018  . Right patella fracture 11/07/2018  . Malignant neoplasm of lower-outer quadrant of right breast of female, estrogen receptor positive ([place]) 05/09/2018  . Elevated troponin 04/06/2018  . Hallucinations 04/06/2018  . Agitation 04/06/2018  . HTN (hypertension) 04/06/2018  . Bilateral ovarian cysts 05/20/2017  . Hyponatremia 11/28/2016  . N&V (nausea and vomiting) 11/28/2016  . Abdominal pain 11/28/2016  . Sleep talking 07/22/2016  . Genetic testing 06/01/2016  . Family history of breast cancer   . Family history of rectal cancer   . Chest pain 09/23/2015  . Abnormal vision 06/13/2015  . Anxiety disorder 06/13/2015  . Cataract cortical, senile 06/13/2015  . Clinical depression 06/13/2015  . Cephalalgia 06/13/2015  . H/O malignant neoplasm of breast 06/13/2015  . 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 Sinus infection 06/13/2015  . Long term current use of systemic steroids 06/13/2015  . Chronic systolic CHF (congestive heart failure), NYHA class 1 ([place]) 05/09/2015  . LBBB (left bundle branch block) 03/26/2015  . Lymphedema of arm 12/13/2013  . Complete rupture of rotator cuff 11/22/2013  . Nipple discharge in female 12/01/2012  . History of breast cancer 11/30/2012  . Epiphora 11/28/2012  . Disorder of endocrine system 04/25/2012  . Ceratitis 04/25/2012  . Blepharitis 04/25/2012  . Breast cancer, right breast ([place])   . FATIGUE 10/01/2009  . 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 Dyspnea 10/01/2009  . HYPERLIPIDEMIA 09/30/2009    Past Medical History:  Past Medical History:  Diagnosis Date  . Allergy   . Anxiety   . Bilateral ovarian cysts    Do yearly ultrasound.  . Cancer ([place]) rt breast   2004/ chemo/ radiation  . Cellulitis and abscess of upper arm and forearm   . Chronic combined systolic and diastolic CHF (congestive heart failure) ([place])   . Dementia ([place])   . Family history of breast cancer   . Family history of rectal cancer   . GERD (gastroesophageal reflux disease)   . H/O Helicobacter infection 2017  . History of right mastectomy 2004   chemo/ radiation  . Hyperlipidemia   . Hypertension   . Insomnia   . Left bundle branch block 2016  . Migraines   . Moderate mitral regurgitation   . NICM (nonischemic cardiomyopathy) ([place])   . Pneumonia   . PONV (postoperative nausea and vomiting)    cannot take zofran  . Postmastectomy lymphedema rt side  . Sentinel node    Past Surgical History:  Past Surgical History:  Procedure Laterality Date  . BREAST EXCISIONAL BIOPSY Left    benign  . CARPAL TUNNEL RELEASE    . CATARACT EXTRACTION Left 07/12/2013  . MANDIBLE SURGERY  1991  . MASTECTOMY Right    malignant  . NASAL SINUS SURGERY  1990/2001  . ORIF FINGER FRACTURE  02/14/2012   Procedure: OPEN REDUCTION INTERNAL FIXATION (ORIF) METACARPAL (FINGER) FRACTURE;  Surgeon: Wynonia Sours, MD;  Location: [facility];  Service: Orthopedics;  Laterality: Right;  RIGHT FIFTH   . ORIF METACARPAL FRACTURE  8/13   rt   . ORIF PATELLA Right 11/07/2018  Procedure: OPEN REDUCTION INTERNAL (ORIF) FIXATION PATELLA;  Surgeon: Nicholes Stairs, MD;  Location: [facility];  Service: Orthopedics;  Laterality: Right;  . ORIF PATELLA FRACTURE Right 11/07/2018  . PORT-A-CATH REMOVAL     in and now out  . RADIOLOGY WITH ANESTHESIA N/A 07/26/2019   Procedure: RADIOLOGY WITH ANESTHESIA;  Surgeon: Luanne Bras, MD;  Location: [facility];  Service: Radiology;  Laterality: N/A;  . REPAIR EXTENSOR TENDON Left  05/30/2013   Procedure: RELEASE TRANSPOSITION EXTENSOR POLLICUS LONGUS LEFT WRIST;  Surgeon: Wynonia Sours, MD;  Location: [facility];  Service: Orthopedics;  Laterality: Left;  . rt mastectomy  2004   lymph nodes-7-axillary node dissection  . SHOULDER ARTHROSCOPY WITH ROTATOR CUFF REPAIR AND SUBACROMIAL DECOMPRESSION Left 11/22/2013   Procedure: LEFT SHOULDER ARTHROSCOPY WITH SUBACROMIAL DECOMPRESSION DISTAL CLAVICLE RESECTION REPAIR  ROTATOR CUFF AS NEEDED ;  Surgeon: Cammie Sickle., MD;  Location: [facility];  Service: Orthopedics;  Laterality: Left;  . WRIST FRACTURE SURGERY     HPI:  69 y.o. female who initially presented to the [facility] after husband noted her to be weak on her right side PMH: right breast cancer, CHF, dementia, GERD, HLD, HTN, migraines, PNA   Assessment / Plan / Recommendation Clinical Impression  [facility] [place] Mental Status (SLUMS) Examination was administered, given history of dementia at baseline. Pt scored 15/30, indicating cognitive impairment. Points were lost on mental math, thought organization, delayed recall, clock drawing, and auditory attention/recall. Pt reports history of poor memory. Husband indicates she has had a very slow gradual decline over a period of time. Deficits noted today raise concern for pt safety with independence, with concern for attention, memory, and higher level thought processes including problem solving, reasoning, awareness, and executive functions. Pt reports her husband manages household finances and her medications. Pt indicated that she was driving, shopping, cooking, cleaning, etc prior to admission. Husband indicated this was not the case. Recommend ST follow up at next venue to maximize safety and independence, as well as to establish functional routines and provide education to pt/family. No further acute ST services recommended at this time.    SLP Assessment  SLP Recommendation/Assessment:  All further Speech Language Pathology needs can be addressed in the next venue of care  SLP Visit Diagnosis: Cognitive communication deficit (R41.841)    Follow Up Recommendations  Home health SLP;24 hour supervision/assistance       SLP Evaluation Cognition  Overall Cognitive Status: History of cognitive impairments - at baseline Arousal/Alertness: Awake/alert Orientation Level: Oriented X4 Attention: Focused;Sustained;Selective Focused Attention: Appears intact Sustained Attention: Appears intact Selective Attention: Impaired Selective Attention Impairment: Verbal basic Memory: Impaired Memory Impairment: Retrieval deficit;Decreased recall of new information;Decreased short term memory;Storage deficit Decreased Short Term Memory: Verbal basic Awareness: Impaired Awareness Impairment: Intellectual impairment Problem Solving: Impaired Problem Solving Impairment: Verbal basic Comments: 15/30 on SLUMS       Comprehension  Auditory Comprehension Overall Auditory Comprehension: Appears within functional limits for tasks assessed    Expression Expression Primary Mode of Expression: Verbal Verbal Expression Overall Verbal Expression: Appears within functional limits for tasks assessed   Oral / Motor  Oral Motor/Sensory Function Overall Oral Motor/Sensory Function: Within functional limits Motor Speech Overall Motor Speech: Appears within functional limits for tasks assessed   GO                   Naeem Quillin B. Quentin Ore, [facility], [place] Speech Language Pathologist Office: [phone] Pager: [phone]  Shonna Chock 07/27/2019, 2:14 PM

## 2019-07-27 NOTE — Progress Notes (Signed)
STROKE TEAM PROGRESS NOTE   INTERVAL HISTORY Pt lying in bed, sheath removed, still need to put leg straight until 2:30pm. Awake alert and fluent speech, can not recall what event made her come to hospital but orientated to place and time. CT at 24h of tPA showed left ACA infarct without hemorrhage. MRI and MRA pending. Still has right foot DF/PF weakness.   Vitals:   07/27/19 1045 07/27/19 1100 07/27/19 1115 07/27/19 1130  BP: (!) 118/94 139/65 136/72 (!) 137/57  Pulse: 77 78 80 72  Resp: 20 (!) 24 (!) 21 20  Temp:      TempSrc:      SpO2: 92% 92% 93% 92%  Weight:        CBC:  Recent Labs  Lab 07/26/19 0524 07/27/19 0651  WBC 7.9 10.4  NEUTROABS 5.5  --   HGB 14.8 13.0  HCT 46.7* 40.5  MCV 93.0 92.0  PLT 273 [id number]    Basic Metabolic Panel:  Recent Labs  Lab 07/26/19 0524 07/27/19 0651  NA 143 141  K 3.7 3.8  CL 108 108  CO2 25 23  GLUCOSE 132* 86  BUN 18 12  CREATININE 1.18* 1.07*  CALCIUM 8.9 8.3*   Lipid Panel:     Component Value Date/Time   CHOL 159 07/27/2019 0651   TRIG 120 07/27/2019 0651   HDL 35 (L) 07/27/2019 0651   CHOLHDL 4.5 07/27/2019 0651   VLDL 24 07/27/2019 0651   LDLCALC 100 (H) 07/27/2019 0651   HgbA1c:  Lab Results  Component Value Date   HGBA1C 6.2 (H) 07/27/2019   Urine Drug Screen:     Component Value Date/Time   LABOPIA NONE DETECTED 12/12/2018 0114   COCAINSCRNUR NONE DETECTED 12/12/2018 0114   LABBENZ POSITIVE (A) 12/12/2018 0114   AMPHETMU NONE DETECTED 12/12/2018 0114   THCU NONE DETECTED 12/12/2018 0114   LABBARB NONE DETECTED 12/12/2018 0114    Alcohol Level     Component Value Date/Time   ETH <10 07/26/2019 0524    IMAGING past 48 hours CT ANGIO HEAD W OR WO CONTRAST  Result Date: 07/26/2019 CLINICAL DATA:  In attention and right leg weakness, sudden onset. EXAM: CT ANGIOGRAPHY HEAD AND NECK CT PERFUSION BRAIN TECHNIQUE: Multidetector CT imaging of the head and neck was performed using the standard protocol  during bolus administration of intravenous contrast. Multiplanar CT image reconstructions and MIPs were obtained to evaluate the vascular anatomy. Carotid stenosis measurements (when applicable) are obtained utilizing NASCET criteria, using the distal internal carotid diameter as the denominator. Multiphase CT imaging of the brain was performed following IV bolus contrast injection. Subsequent parametric perfusion maps were calculated using RAPID software. CONTRAST:  176mL OMNIPAQUE IOHEXOL 350 MG/ML SOLN COMPARISON:  Noncontrast head CT earlier today FINDINGS: CTA NECK FINDINGS Aortic arch: Mild atheromatous changes. Three vessel branching. No dilatation. Right carotid system: No noted atheromatous changes. ICA tortuosity with loop. Left carotid system: Smaller vessel size to circle-of-Willis variant. ICA looping at the skull base. No noted atheromatous changes, stenosis, or ulceration. Vertebral arteries: No proximal subclavian stenosis or atherosclerosis. Mild left vertebral artery dominance. Both vertebral arteries are smooth and widely patent to the dura. Skeleton: Cervical spine degeneration and bilateral mandibular osteoplasty Other neck: No acute finding Upper chest: Layering pleural effusions. Cardiomegaly on preceding x-ray. Review of the MIP images confirms the above findings CTA HEAD FINDINGS Anterior circulation: Aplastic left A1 segment. Left A2 short segment occlusion with distal reconstitution, acute appearing and likely embolic. Negative  for aneurysm Posterior circulation: Smooth vertebral and basilar arteries. No PCA branch occlusion. Venous sinuses: Negative for aneurysm. Anatomic variants: As above Review of the MIP images confirms the above findings CT Brain Perfusion Findings: ASPECTS: 10 CBF (<30%) Volume: 52mL Perfusion (Tmax>6.0s) volume: 46mL Mismatch Volume: 58mL Infarction Location:Left frontal These results were called by telephone at the time of interpretation on 07/26/2019 at 7:49 am to  provider Pollina , who verbally acknowledged these results. IMPRESSION: 1. Left A2 embolism with short segment occlusion. There is an associated 8 cc core infarct and 22 cc of penumbra. 2. Minimal atheromatous changes. There is however cardiomegaly and layering pleural effusions, question cardiac source. 3. Bilateral ICA looping in the neck.  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 Electronically Signed   By: Monte Fantasia M.D.   On: 07/26/2019 07:53   CT HEAD WO CONTRAST  Result Date: 07/27/2019 CLINICAL DATA:  Stroke, follow-up. Noncontrast CT head, CT angiogram head/neck and CT perfusion 07/26/2019 EXAM: CT HEAD WITHOUT CONTRAST TECHNIQUE: Contiguous axial images were obtained from the base of the skull through the vertex without intravenous contrast. COMPARISON:  Noncontrast CT head, CT angiogram head/neck and CT perfusion 07/26/2019 FINDINGS: Brain: There is loss of gray-white differentiation within portions of the anterior left frontal lobe, left ACA vascular territory, consistent with acute infarction (series 3, images 17-22) (series 5, images 15-21). No evidence of hemorrhagic conversion. No significant mass effect. No midline shift. No evidence of intracranial mass. No extra-axial fluid collection. Vascular: Hyperdense vessel. Skull: Normal. Negative for fracture or focal lesion. Sinuses/Orbits: Visualized orbits demonstrate no acute abnormality. Postsurgical appearance of the paranasal sinuses. No significant paranasal sinus disease or mastoid effusion at the imaged levels. IMPRESSION: Acute left frontal lobe ACA territory ischemic infarct. No hemorrhagic conversion. No significant mass effect. Electronically Signed   By: Kellie Simmering DO   On: 07/27/2019 07:41   CT ANGIO NECK W OR WO CONTRAST  Result Date: 07/26/2019 CLINICAL DATA:  In attention and right leg weakness, sudden onset. EXAM: CT ANGIOGRAPHY HEAD AND NECK CT PERFUSION BRAIN TECHNIQUE: Multidetector CT imaging of the head and neck was performed using  the standard protocol during bolus administration of intravenous contrast. Multiplanar CT image reconstructions and MIPs were obtained to evaluate the vascular anatomy. Carotid stenosis measurements (when applicable) are obtained utilizing NASCET criteria, using the distal internal carotid diameter as the denominator. Multiphase CT imaging of the brain was performed following IV bolus contrast injection. Subsequent parametric perfusion maps were calculated using RAPID software. CONTRAST:  143mL OMNIPAQUE IOHEXOL 350 MG/ML SOLN COMPARISON:  Noncontrast head CT earlier today FINDINGS: CTA NECK FINDINGS Aortic arch: Mild atheromatous changes. Three vessel branching. No dilatation. Right carotid system: No noted atheromatous changes. ICA tortuosity with loop. Left carotid system: Smaller vessel size to circle-of-Willis variant. ICA looping at the skull base. No noted atheromatous changes, stenosis, or ulceration. Vertebral arteries: No proximal subclavian stenosis or atherosclerosis. Mild left vertebral artery dominance. Both vertebral arteries are smooth and widely patent to the dura. Skeleton: Cervical spine degeneration and bilateral mandibular osteoplasty Other neck: No acute finding Upper chest: Layering pleural effusions. Cardiomegaly on preceding x-ray. Review of the MIP images confirms the above findings CTA HEAD FINDINGS Anterior circulation: Aplastic left A1 segment. Left A2 short segment occlusion with distal reconstitution, acute appearing and likely embolic. Negative for aneurysm Posterior circulation: Smooth vertebral and basilar arteries. No PCA branch occlusion. Venous sinuses: Negative for aneurysm. Anatomic variants: As above Review of the MIP images confirms the above findings  CT Brain Perfusion Findings: ASPECTS: 10 CBF (<30%) Volume: 45mL Perfusion (Tmax>6.0s) volume: 6mL Mismatch Volume: 8mL Infarction Location:Left frontal These results were called by telephone at the time of interpretation on  07/26/2019 at 7:49 am to provider Pollina , who verbally acknowledged these results. IMPRESSION: 1. Left A2 embolism with short segment occlusion. There is an associated 8 cc core infarct and 22 cc of penumbra. 2. Minimal atheromatous changes. There is however cardiomegaly and layering pleural effusions, question cardiac source. 3. Bilateral ICA looping in the neck.  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 Electronically Signed   By: Monte Fantasia M.D.   On: 07/26/2019 07:53   CT CEREBRAL PERFUSION W CONTRAST  Result Date: 07/26/2019 CLINICAL DATA:  In attention and right leg weakness, sudden onset. EXAM: CT ANGIOGRAPHY HEAD AND NECK CT PERFUSION BRAIN TECHNIQUE: Multidetector CT imaging of the head and neck was performed using the standard protocol during bolus administration of intravenous contrast. Multiplanar CT image reconstructions and MIPs were obtained to evaluate the vascular anatomy. Carotid stenosis measurements (when applicable) are obtained utilizing NASCET criteria, using the distal internal carotid diameter as the denominator. Multiphase CT imaging of the brain was performed following IV bolus contrast injection. Subsequent parametric perfusion maps were calculated using RAPID software. CONTRAST:  19mL OMNIPAQUE IOHEXOL 350 MG/ML SOLN COMPARISON:  Noncontrast head CT earlier today FINDINGS: CTA NECK FINDINGS Aortic arch: Mild atheromatous changes. Three vessel branching. No dilatation. Right carotid system: No noted atheromatous changes. ICA tortuosity with loop. Left carotid system: Smaller vessel size to circle-of-Willis variant. ICA looping at the skull base. No noted atheromatous changes, stenosis, or ulceration. Vertebral arteries: No proximal subclavian stenosis or atherosclerosis. Mild left vertebral artery dominance. Both vertebral arteries are smooth and widely patent to the dura. Skeleton: Cervical spine degeneration and bilateral mandibular osteoplasty Other neck: No acute finding Upper chest:  Layering pleural effusions. Cardiomegaly on preceding x-ray. Review of the MIP images confirms the above findings CTA HEAD FINDINGS Anterior circulation: Aplastic left A1 segment. Left A2 short segment occlusion with distal reconstitution, acute appearing and likely embolic. Negative for aneurysm Posterior circulation: Smooth vertebral and basilar arteries. No PCA branch occlusion. Venous sinuses: Negative for aneurysm. Anatomic variants: As above Review of the MIP images confirms the above findings CT Brain Perfusion Findings: ASPECTS: 10 CBF (<30%) Volume: 54mL Perfusion (Tmax>6.0s) volume: 18mL Mismatch Volume: 76mL Infarction Location:Left frontal These results were called by telephone at the time of interpretation on 07/26/2019 at 7:49 am to provider Pollina , who verbally acknowledged these results. IMPRESSION: 1. Left A2 embolism with short segment occlusion. There is an associated 8 cc core infarct and 22 cc of penumbra. 2. Minimal atheromatous changes. There is however cardiomegaly and layering pleural effusions, question cardiac source. 3. Bilateral ICA looping in the neck.  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 Electronically Signed   By: Monte Fantasia M.D.   On: 07/26/2019 07:53   ECHOCARDIOGRAM COMPLETE  Result Date: 07/26/2019   ECHOCARDIOGRAM REPORT   Patient Name:   Kylie Tucker Date of Exam: 07/26/2019 Medical Rec #:  [id number]        Height:       62.0 in Accession #:    [id number]       Weight:       183.1 lb Date of Birth:  04-Aug-1950        BSA:          1.84 m Patient Age:    44 years  BP:           121/51 mmHg Patient Gender: F                HR:           66 bpm. Exam Location:  Inpatient Procedure: 2D Echo, Cardiac Doppler and Color Doppler Indications:    Stroke 434.91  History:        Patient has prior history of Echocardiogram examinations, most                 recent 04/06/2018. CHF, Stroke, Arrythmias:LBBB,                 Signs/Symptoms:Chest Pain and Dyspnea; Risk                  Factors:Hypertension, Dyslipidemia and Non-Smoker. Elevated                 troponin.  Sonographer:    Paulita Fujita RDCS Referring Phys: [place]  1. Left ventricular ejection fraction, by visual estimation, is 25 to 30%. The left ventricle has moderate to severely decreased function. Left ventricular septal wall thickness was normal. Normal left ventricular posterior wall thickness. There is no left ventricular hypertrophy.  2. The left ventricle demonstrates global hypokinesis.  3. Global right ventricle has normal systolic function.The right ventricular size is normal. No increase in right ventricular wall thickness.  4. Left atrial size was mildly dilated.  5. Right atrial size was normal.  6. Large pleural effusion in the left lateral region.  7. Small pericardial effusion.  8. The pericardial effusion is posterior to the left ventricle.  9. The mitral valve is normal in structure. Mild mitral valve regurgitation. No evidence of mitral stenosis. 10. The tricuspid valve is normal in structure. 11. The aortic valve is tricuspid. Aortic valve regurgitation is not visualized. No evidence of aortic valve sclerosis or stenosis. 12. Pulmonic regurgitation is mild. 13. The pulmonic valve was normal in structure. Pulmonic valve regurgitation is mild. 14. Moderately elevated pulmonary artery systolic pressure. 15. The inferior vena cava is normal in size with <50% respiratory variability, suggesting right atrial pressure of 8 mmHg. FINDINGS  Left Ventricle: Left ventricular ejection fraction, by visual estimation, is 25 to 30%. The left ventricle has moderate to severely decreased function. The left ventricle demonstrates global hypokinesis. Normal left ventricular posterior wall thickness.  There is no left ventricular hypertrophy. Normal left atrial pressure. Right Ventricle: The right ventricular size is normal. No increase in right ventricular wall thickness. Global RV systolic function is has  normal systolic function. The tricuspid regurgitant velocity is 3.11 m/s, and with an assumed right atrial pressure  of 8 mmHg, the estimated right ventricular systolic pressure is moderately elevated at 46.7 mmHg. Left Atrium: Left atrial size was mildly dilated. Right Atrium: Right atrial size was normal in size Pericardium: A small pericardial effusion is present. The pericardial effusion is posterior to the left ventricle. There is a large pleural effusion in the left lateral region. Mitral Valve: The mitral valve is normal in structure. Mild mitral valve regurgitation. No evidence of mitral valve stenosis by observation. Tricuspid Valve: The tricuspid valve is normal in structure. Tricuspid valve regurgitation is mild. Aortic Valve: The aortic valve is tricuspid. Aortic valve regurgitation is not visualized. The aortic valve is structurally normal, with no evidence of sclerosis or stenosis. Pulmonic Valve: The pulmonic valve was normal in structure. Pulmonic valve regurgitation is mild. Pulmonic regurgitation is  mild. Aorta: The aortic root, ascending aorta and aortic arch are all structurally normal, with no evidence of dilitation or obstruction. Venous: The inferior vena cava is normal in size with less than 50% respiratory variability, suggesting right atrial pressure of 8 mmHg. IAS/Shunts: No atrial level shunt detected by color flow Doppler. There is no evidence of a patent foramen ovale. No ventricular septal defect is seen or detected. There is no evidence of an atrial septal defect.  LEFT VENTRICLE PLAX 2D LVIDd:         4.90 cm LVIDs:         4.40 cm LV PW:         0.80 cm LV IVS:        0.80 cm LVOT diam:     1.60 cm LV SV:         25 ml LV SV Index:   12.94 LVOT Area:     2.01 cm  LV Volumes (MOD) LV area d, A2C:    48.70 cm LV area d, A4C:    47.40 cm LV area s, A2C:    36.00 cm LV area s, A4C:    38.30 cm LV major d, A2C:   9.76 cm LV major d, A4C:   9.44 cm LV major s, A2C:   8.58 cm LV major  s, A4C:   8.60 cm LV vol d, MOD A2C: 203.0 ml LV vol d, MOD A4C: 197.0 ml LV vol s, MOD A2C: 132.0 ml LV vol s, MOD A4C: 140.0 ml LV SV MOD A2C:     71.0 ml LV SV MOD A4C:     197.0 ml LV SV MOD BP:      67.8 ml RIGHT VENTRICLE RV S prime:     10.20 cm/s TAPSE (M-mode): 2.1 cm LEFT ATRIUM           Index       RIGHT ATRIUM           Index LA diam:      3.70 cm 2.01 cm/m  RA Area:     16.20 cm LA Vol (A4C): 53.6 ml 29.11 ml/m RA Volume:   43.10 ml  23.41 ml/m  AORTIC VALVE LVOT Vmax:   110.00 cm/s LVOT Vmean:  75.800 cm/s LVOT VTI:    0.237 m  AORTA Ao Root diam: 3.10 cm MITRAL VALVE                        TRICUSPID VALVE MV Area (PHT): 4.15 cm             TV Peak grad:   23.8 mmHg MV PHT:        53.07 msec           TV Vmax:        2.44 m/s MV Decel Time: 183 msec             TR Peak grad:   38.7 mmHg MR Peak grad: 90.4 mmHg             TR Vmax:        311.00 cm/s MR Vmax:      475.50 cm/s MV E velocity: 90.80 cm/s 103 cm/s  SHUNTS MV A velocity: 79.70 cm/s 70.3 cm/s Systemic VTI:  0.24 m MV E/A ratio:  1.14       1.5       Systemic Diam: 1.60 cm  Skeet Latch MD Electronically signed by  Skeet Latch MD Signature Date/Time: 07/26/2019/7:27:24 PM    Final    CT HEAD CODE STROKE WO CONTRAST  Result Date: 07/26/2019 CLINICAL DATA:  Code stroke.  Sudden onset weakness EXAM: CT HEAD WITHOUT CONTRAST TECHNIQUE: Contiguous axial images were obtained from the base of the skull through the vertex without intravenous contrast. COMPARISON:  04/06/2018 FINDINGS: Brain: No evidence of acute infarction, hemorrhage, hydrocephalus, extra-axial collection or mass lesion/mass effect. Vascular: No hyperdense vessel or unexpected calcification. Skull: Normal. Negative for fracture or focal lesion. Sinuses/Orbits: No acute finding. Other: Attempted call report. ASPECTS [facility] Stroke Program Early CT Score) Not scored without laterality. IMPRESSION: Negative head CT. Electronically Signed   By: Monte Fantasia M.D.    On: 07/26/2019 05:21   Cerebral Angio 07/26/2019 S/P 4 vessel cerebral arteriogram followed by complete revascularization of occluded Lt ACA A2 seg via RT ICA approach ,with x 1 pass with trevoprovue 41mm x 68mm retriever and penumbra aspiration achieving a TICI 3 revascularization.   PHYSICAL EXAM  Temp:  [97.3 F (36.3 C)-98.7 F (37.1 C)] 97.8 F (36.6 C) (01/15 1200) Pulse Rate:  [58-81] 73 (01/15 1230) Resp:  [12-28] 28 (01/15 1230) BP: (90-173)/(51-155) 148/62 (01/15 1230) SpO2:  [90 %-99 %] 94 % (01/15 1230) Arterial Line BP: (94-155)/(45-92) 94/86 (01/15 1030)  General - Well nourished, well developed, in no apparent distress.  Ophthalmologic - fundi not visualized due to noncooperation.  Cardiovascular - Regular rhythm and rate.  Mental Status -  Level of arousal and orientation to time, place, and person were intact. Language including expression, naming, repetition, comprehension was assessed and found intact. Fund of Knowledge was assessed and was impaired.  Cranial Nerves II - XII - II - Visual field intact OU. III, IV, VI - Extraocular movements intact. V - Facial sensation intact bilaterally. VII - Facial movement intact bilaterally. VIII - Hearing & vestibular intact bilaterally. X - Palate elevates symmetrically. XI - Chin turning & shoulder shrug intact bilaterally. XII - Tongue protrusion intact.  Motor Strength - The patient's strength was normal in all extremities except right foot DF and PF 3-/5 and right knee flexion 4/5 and pronator drift was absent.  Bulk was normal and fasciculations were absent.   Motor Tone - Muscle tone was assessed at the neck and appendages and was normal.  Reflexes - The patient's reflexes were symmetrical in all extremities and she had no pathological reflexes.  Sensory - Light touch, temperature/pinprick were assessed and were symmetrical.    Coordination - The patient had normal movements in the hands with no ataxia or  dysmetria.  Tremor was absent.  Gait and Station - deferred.   ASSESSMENT/PLAN Kylie Tucker is a 69 y.o. female with history of HTN, HLD, migraines, breast cancer, CHF, dementia, GERD presenting to [facility] ED with R sided weakness. treated w/ tPA 1/14 at 0600. Found to have  L A2 occlusion and transferred to [facility] for IR.   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   CTA head & neck L A2 embolism w/ short segment occlusion. Cardiomyopathy and layering pleural effusions. B ICA looping in neck. L A1 aplastic.   CT perfusion 8cc core, 22cc penumbra  Pre IR CT following neuro changes without ICH post tPA   Cerebral angio L ACA A2  occlusion s/p TICI3 revascularization   CT head L frontal lobe ACA infarct   MRI pending  MRA pending  2D Echo EF 25-30%. No LV thrombus   LDL 100  HgbA1c 6.2  Lovenox 40 mg sq daily for VTE prophylaxis  No antithrombotic prior to admission, now on plavix 75mg  daily (ASA allergy). Given low EF, will recommend anticoagulation with NOAC for stroke prevention if MRI did not show large infarct.     Therapy recommendations:  pending   Disposition:  pending   Postop Acute Respiratory Failure  Intubated for IR  Left intubated post IR as not fully responsive  CCM on board   Extubated later in day without difficulties  Cardiomyopathy  Acute on chronic combined systolic and diastolic congestive heart failure  NICM w/ EF 25-30%, on home Lasix and metoprolol  03/2018 EF 40 to 45%  Patient stated that she follows with Dr. Einar Gip  Continue metoprolol  Hypertension  Home meds:  toprol 50  Off cleviprex  Stable   On norvasc 5 and home metoprolol . Long-term BP goal normotensive  Hyperlipidemia  Home meds:  No statin   LDL 100, goal < 70  Intolerance  to crestor  Add pravastatin 40  Continue statin at discharge  Other Stroke Risk Factors  Advanced age  Obesity, Body mass index is 33.49 kg/m., recommend weight loss, diet and exercise as appropriate   Migraines  Other Active Problems  Hx breast cancer w/ R effusion, pretracheal adenopathy - warrants evaluation once stable  AKI Cre 1.18->1.07  Hospital day # 1  This patient is critically ill due to stroke, status post TPA and thrombectomy, cardiomyopathy, CHF, AKI and at significant risk of neurological worsening, death form recurrent stroke, hemorrhagic conversion, heart failure, seizure. This patient's care requires constant monitoring of vital signs, hemodynamics, respiratory and cardiac monitoring, review of multiple databases, neurological assessment, discussion with family, other specialists and medical decision making of high complexity. I spent 40 minutes of neurocritical care time in the care of this patient.  Rosalin Hawking, MD PhD Stroke Neurology 07/27/2019 8:54 PM  To contact Stroke Continuity provider, please refer to [email or web address] After hours, contact General Neurology

## 2019-07-27 NOTE — Consult Note (Signed)
NAME:  Kylie Tucker, MRN:  [id number], DOB:  1951/01/10, LOS: 1 ADMISSION DATE:  07/26/2019, CONSULTATION DATE:  07/26/19 REFERRING MD:  Estanislado Pandy, CHIEF COMPLAINT:  Leg weakness   Brief History   69 year old woman with history of breast cancer, GERD, NICM, LBBB, ?dementia, allergies, anxiety presenting with L ACA stroke s/p tPA and mechanical clot retrieval.  History of present illness    70 year old woman with history of breast cancer, GERD, NICM, LBBB, ?dementia, allergies, anxiety presenting to [facility] 1/14 AM with sudden onset right leg weakness found to have occluded left ACA.  She underwent systemic tPA at [facility] and transfer to [facility] for mechanical clot retrieval by Dr. Estanislado Pandy at L ACA II with TICI 3 post procedural flow and no hemorrhage.  PCCM consulted for vent management.  History per chart review as patient is intubated and sedated.  Past Medical History  Dementia GERD NICM Systolic and diastolic heart failure LBBB Anxiety Sleep disturbance HTN  Significant Hospital Events   07/26/19 admitted  Consults:  Neurology Neurointerventional PCCM  Procedures:  07/26/19 4 vessel cerebral arteriogram followed by complete revascularization of occluded Lt ACA A2 seg via RT ICA approach ,with x 1 pass with trevoprovue 73mm x 59mm retriever and penumbra aspiration achieving a TICI 3 revascularization.  Significant Diagnostic Tests:  CTA Head IMPRESSION: 1. Left A2 embolism with short segment occlusion. There is an associated 8 cc core infarct and 22 cc of penumbra. 2. Minimal atheromatous changes. There is however cardiomegaly and layering pleural effusions, question cardiac source. 3. Bilateral ICA looping in the neck.  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  ECHO 1/14:  LVEF 25-30%, moderately to severely decreased function. LV global hypokinesis. Normal RV systolic function. Mild mitral regurg, mild  CT H 1/15> acute L frontal ACA ischemic infarct. no hemorrhagic conversion no  significant mass effect  Micro Data:  COVID Neg  Antimicrobials:  N/A   Interim history/subjective:  Extubated yesterday  NAEO Sheath pulled this morning  Objective   Blood pressure 139/60, pulse 68, temperature 98.4 F (36.9 C), temperature source Axillary, resp. rate (!) 22, weight 83.1 kg, last menstrual period 01/10/2003, SpO2 94 %.    Vent Mode: PRVC FiO2 (%):  [100 %] 100 % Set Rate:  [18 bmp] 18 bmp Vt Set:  [400 mL] 400 mL PEEP:  [5 cmH20] 5 cmH20 Plateau Pressure:  [16 cmH20] 16 cmH20   Intake/Output Summary (Last 24 hours) at 07/27/2019 0836 Last data filed at 07/27/2019 0700 Gross per 24 hour  Intake 1080.53 ml  Output 415 ml  Net 665.53 ml   Filed Weights   07/26/19 0532  Weight: 83.1 kg    Examination: General: wdwn older adult F supine in bed NAD  HENT: NCAT pink mmm patent nares trachea midline anicteric sclera Lungs: CTA bilaterally symmetrical chest expansion without accessory muscle use  Cardiovascular: RRR + murmur. Cap refill < 3 seconds  Abdomen: soft round ndnt  Extremities: Symmetrical bulk and tone. As Neuro: AAO x4 with mild delay in speech. following commands. PERRLA.  Skin: c/d/w/i without rash. Sheath site not assessed as pressure actively being held by team member following removal   Resolved Hospital Problem list   N/A  Assessment & Plan:  # Left ACA Ischemic CVA s/p systemic tPA and mechanical clot retrieval with good angiographic results  P -per Neuro/NIR -cleviprex for SBP goal 120-140, wean as able  -PT/OT/SLP -further imaging per neuro  # Postoperative respiratory failure after above  procedure, resolved -extubated 1/14 P -Monitor for airways protection -IS/pulm hygiene -SpO2 goal > 92, supplemental O2 PRN  # Underlying NICM -Acute on chronic systolic and diastolic heart failure -HTN -ECHO with LVEF 25-30%, moderately to severely decreased function. LV global hypokinesis. Normal RV systolic function.  P -continue home  lasix, metop   # History of breast cancer, right effusion, pretracheal adenopathy- warrants eval once things settle out  Best practice:  Diet: pending SLP eval per primary Pain/Anxiety/Delirium protocol (if indicated): DC VAP protocol (if indicated): Not needed DVT prophylaxis: SCDs GI prophylaxis: PPI Glucose control: q4h checks until taking PO Mobility: BR pending PT eval Code Status: Full Family Communication: per primary Disposition:  Neuro ICU  Labs   CBC: Recent Labs  Lab 07/26/19 0524 07/27/19 0651  WBC 7.9 10.4  NEUTROABS 5.5  --   HGB 14.8 13.0  HCT 46.7* 40.5  MCV 93.0 92.0  PLT 273 [id number]    Basic Metabolic Panel: Recent Labs  Lab 07/26/19 0524 07/27/19 0651  NA 143 141  K 3.7 3.8  CL 108 108  CO2 25 23  GLUCOSE 132* 86  BUN 18 12  CREATININE 1.18* 1.07*  CALCIUM 8.9 8.3*   GFR: Estimated Creatinine Clearance: 50.3 mL/min (A) (by C-G formula based on SCr of 1.07 mg/dL (H)). Recent Labs  Lab 07/26/19 0524 07/27/19 0651  WBC 7.9 10.4    Liver Function Tests: Recent Labs  Lab 07/26/19 0524  AST 34  ALT 30  ALKPHOS 64  BILITOT 0.9  PROT 6.5  ALBUMIN 3.3*   No results for input(s): LIPASE, AMYLASE in the last 168 hours. No results for input(s): AMMONIA in the last 168 hours.  ABG    Component Value Date/Time   TCO2 29 06/28/2011 2200     Coagulation Profile: Recent Labs  Lab 07/26/19 0524  INR 1.1    Cardiac Enzymes: No results for input(s): CKTOTAL, CKMB, CKMBINDEX, TROPONINI in the last 168 hours.  HbA1C: Hgb A1c MFr Bld  Date/Time Value Ref Range Status  07/27/2019 06:51 AM 6.2 (H) 4.8 - 5.6 % Final    Comment:    (NOTE) Pre diabetes:          5.7%-6.4% Diabetes:              >6.4% Glycemic control for   <7.0% adults with diabetes     CBG: Recent Labs  Lab 07/26/19 0520 07/26/19 1709 07/26/19 1950 07/26/19 2327 07/27/19 0327  GLUCAP 119* 105* 98 95 84    CRITICAL CARE Performed by: Cristal Generous    Total critical care time: 35 minutes  Critical care time was exclusive of separately billable procedures and treating other patients. Critical care was necessary to treat or prevent imminent or life-threatening deterioration.  Critical care was time spent personally by me on the following activities: development of treatment plan with patient and/or surrogate as well as nursing, discussions with consultants, evaluation of patient's response to treatment, examination of patient, obtaining history from patient or surrogate, ordering and performing treatments and interventions, ordering and review of laboratory studies, ordering and review of radiographic studies, pulse oximetry and re-evaluation of patient's condition.   Eliseo Gum MSN, AGACNP-BC [place] [id number] If no answer, [id number] 07/27/2019, 8:41 AM

## 2019-07-28 ENCOUNTER — Inpatient Hospital Stay (HOSPITAL_COMMUNITY): Payer: PPO

## 2019-07-28 LAB — BASIC METABOLIC PANEL
Anion gap: 9 (ref 5–15)
BUN: 17 mg/dL (ref 8–23)
CO2: 23 mmol/L (ref 22–32)
Calcium: 8.4 mg/dL — ABNORMAL LOW (ref 8.9–10.3)
Chloride: 108 mmol/L (ref 98–111)
Creatinine, Ser: 1.14 mg/dL — ABNORMAL HIGH (ref 0.44–1.00)
GFR calc Af Amer: 57 mL/min — ABNORMAL LOW (ref >60)
GFR calc non Af Amer: 49 mL/min — ABNORMAL LOW (ref >60)
Glucose, Bld: 96 mg/dL (ref 70–99)
Potassium: 4.1 mmol/L (ref 3.5–5.1)
Sodium: 140 mmol/L (ref 135–145)

## 2019-07-28 LAB — CBC
HCT: 38.5 % (ref 36.0–46.0)
Hemoglobin: 12.3 g/dL (ref 12.0–15.0)
MCH: 29.7 pg (ref 26.0–34.0)
MCHC: 31.9 g/dL (ref 30.0–36.0)
MCV: 93 fL (ref 80.0–100.0)
Platelets: 219 10*3/uL (ref 150–400)
RBC: 4.14 MIL/uL (ref 3.87–5.11)
RDW: 15.6 % — ABNORMAL HIGH (ref 11.5–15.5)
WBC: 9.6 10*3/uL (ref 4.0–10.5)
nRBC: 0 % (ref 0.0–0.2)

## 2019-07-28 NOTE — Progress Notes (Signed)
Rehab Admissions Coordinator Note:  Per PT recommendation, this patient was screened by Raechel Ache for appropriateness for an Inpatient Acute Rehab Consult.  At this time, we are recommending an Inpatient Rehab consult.  AC will contact MD to request order.   Raechel Ache 07/28/2019, 3:04 PM  I can be reached at [phone].

## 2019-07-28 NOTE — Progress Notes (Signed)
Occupational Therapy Evaluation Patient Details Name: Kylie Tucker MRN: [id number] DOB: Dec 26, 1950 Today's Date: 07/28/2019    History of Present Illness Pt is a 69 y.o. female who initially presented to the ED after husband noted her to be weak on her right side. MRI showing evolving acute ischemic left ACA territory infarct. Pt s/p emergent mechanical thrombectomy of left ACA A2 segment occlusion achieving a TICI 3 revascularization 07/26/2019 by Dr. Estanislado Pandy. PMH: right breast cancer, CHF, dementia, GERD, HLD, HTN, migraines, PNA    Clinical Impression   PTA, pt was living at home with her husband, pt reports she was independent with ADL/IADL, but chart states her husband assisted with IADL. Pt reports she was modified independent at spc level with functional mobiltiy. Pt currently requires modA for LB dressing and sit<>stand transfers. She requires minA for UB dressing and grooming while seated due to poor sitting balance with right lateral lean. Due to decline in current level of function, pt would benefit from acute OT to address established goals to facilitate safe D/C to venue listed below. At this time, recommend CIR follow-up. Will continue to follow acutely.    Follow Up Recommendations  CIR;Supervision/Assistance - 24 hour    Equipment Recommendations  3 in 1 bedside commode    Recommendations for Other Services       Precautions / Restrictions Precautions Precautions: Fall Restrictions Weight Bearing Restrictions: No      Mobility Bed Mobility Overal bed mobility: Needs Assistance Bed Mobility: Supine to Sit     Supine to sit: Mod assist     General bed mobility comments: pt sitting in recliner upon arrival  Transfers Overall transfer level: Needs assistance Equipment used: Rolling walker (2 wheeled);None Transfers: Sit to/from Stand Sit to Stand: Mod assist Stand pivot transfers: Mod assist       General transfer comment: ModA to powerup from  recliner; stood from recliner x 4    Balance Overall balance assessment: Needs assistance Sitting-balance support: Feet supported Sitting balance-Leahy Scale: Poor Sitting balance - Comments: frequent loss of posterior balance Postural control: Right lateral lean Standing balance support: Bilateral upper extremity supported Standing balance-Leahy Scale: Poor Standing balance comment: reliant on external support;stood in front of mirror to increase awareness to body and address standing balance, posture and weight shifting, pt able to correct to midline and upright posture looking in the mirror and with mincues                           ADL either performed or assessed with clinical judgement   ADL Overall ADL's : Needs assistance/impaired Eating/Feeding: Set up;Sitting   Grooming: Set up;Sitting   Upper Body Bathing: Minimal assistance;Sitting   Lower Body Bathing: Moderate assistance;Sit to/from stand   Upper Body Dressing : Minimal assistance;Sitting   Lower Body Dressing: Moderate assistance;Sit to/from stand   Toilet Transfer: Moderate assistance;Stand-pivot   Toileting- Clothing Manipulation and Hygiene: Moderate assistance;Sit to/from stand       Functional mobility during ADLs: Moderate assistance;Rolling walker General ADL Comments: pt limited by decreased activity tolerance, generalized weakness, and cognition     Vision Baseline Vision/History: Wears glasses Wears Glasses: At all times Patient Visual Report: No change from baseline Vision Assessment?: No apparent visual deficits Additional Comments: vision assessed, no apparent visual deficits     Perception     Praxis      Pertinent Vitals/Pain Pain Assessment: No/denies pain     Hand Dominance  Extremity/Trunk Assessment Upper Extremity Assessment Upper Extremity Assessment: Generalized weakness;RUE deficits/detail;LUE deficits/detail RUE Deficits / Details: RUE 3-/5 grossly; RUE  Sensation: WNL RUE Coordination: decreased fine motor;decreased gross motor LUE Deficits / Details: 3+/5 grossly LUE Sensation: WNL LUE Coordination: decreased fine motor;decreased gross motor   Lower Extremity Assessment Lower Extremity Assessment: Defer to PT evaluation RLE Deficits / Details: Hip flexion 2/5, knee extension 4/5, ankle dorsiflexion 2/5 LLE Deficits / Details: WFL       Communication Communication Communication: No difficulties   Cognition Arousal/Alertness: Awake/alert Behavior During Therapy: WFL for tasks assessed/performed Overall Cognitive Status: History of cognitive impairments - at baseline                                 General Comments: Pt A&Ox4, poor historian, significantly decreased awareness of deficits/safety. Follows 1 step commands consistently. Needs cues for initiation   General Comments  pt's husband present during session    Exercises     Shoulder Instructions      Home Living Family/patient expects to be discharged to:: Private residence Living Arrangements: Spouse/significant other Available Help at Discharge: Family;Available 24 hours/day Type of Home: House Home Access: Stairs to enter [employer] of Steps: 6 Entrance Stairs-Rails: Left Home Layout: One level     Bathroom Shower/Tub: Teacher, early years/pre: Standard     Home Equipment: Grab bars - toilet;Cane - single point;Crutches      Lives With: Spouse    Prior Functioning/Environment Level of Independence: Needs assistance  Gait / Transfers Assistance Needed: Mod indep with SPC ADL's / Homemaking Assistance Needed: pt states she is independent, but per chart review, pt husband assisting with IADL's            OT Problem List: Decreased strength;Decreased range of motion;Decreased activity tolerance;Impaired balance (sitting and/or standing);Decreased cognition;Decreased safety awareness;Decreased knowledge of precautions       OT Treatment/Interventions: Self-care/ADL training;Therapeutic exercise;Energy conservation;DME and/or AE instruction;Therapeutic activities;Cognitive remediation/compensation;Patient/family education;Balance training    OT Goals(Current goals can be found in the care plan section) Acute Rehab OT Goals Patient Stated Goal: pt did not state OT Goal Formulation: With patient Time For Goal Achievement: 08/11/19 Potential to Achieve Goals: Good ADL Goals Pt Will Perform Grooming: with set-up;sitting Pt Will Perform Upper Body Dressing: with set-up;sitting Pt Will Perform Lower Body Dressing: with min assist;sit to/from stand Pt Will Transfer to Toilet: with min assist;stand pivot transfer Additional ADL Goal #1: Pt will progress to EOB with minguard in preparation for ADL.  OT Frequency: Min 2X/week   Barriers to D/C: Decreased caregiver support  pt lives with husband       Co-evaluation              AM-PAC OT "6 Clicks" Daily Activity     Outcome Measure Help from another person eating meals?: A Little Help from another person taking care of personal grooming?: A Little Help from another person toileting, which includes using toliet, bedpan, or urinal?: A Lot Help from another person bathing (including washing, rinsing, drying)?: A Lot Help from another person to put on and taking off regular upper body clothing?: A Little Help from another person to put on and taking off regular lower body clothing?: A Lot 6 Click Score: 15   End of Session Equipment Utilized During Treatment: Gait belt;Rolling walker Nurse Communication: Mobility status  Activity Tolerance: Patient tolerated treatment well Patient left: in chair;with  call bell/phone within reach;with chair alarm set  OT Visit Diagnosis: Unsteadiness on feet (R26.81);Other abnormalities of gait and mobility (R26.89);Muscle weakness (generalized) (M62.81);History of falling (Z91.81);Other symptoms and signs involving  cognitive function                Time: 1432-1455 OT Time Calculation (min): 23 min Charges:  OT General Charges $OT Visit: 1 Visit OT Evaluation $OT Eval Moderate Complexity: 1 Mod OT Treatments $Self Care/Home Management : 8-22 mins  Dorinda Hill OTR/L Acute Rehabilitation Services Office: [place] 07/28/2019, 4:04 PM

## 2019-07-28 NOTE — Evaluation (Signed)
Physical Therapy Evaluation Patient Details Name: Kylie Tucker MRN: [id number] DOB: September 21, 1950 Today's Date: 07/28/2019   History of Present Illness  Pt is a 69 y.o. female who initially presented to the ED after husband noted her to be weak on her right side. MRI showing evolving acute ischemic left ACA territory infarct. Pt s/p emergent mechanical thrombectomy of left ACA A2 segment occlusion achieving a TICI 3 revascularization 07/26/2019 by Dr. Estanislado Pandy. PMH: right breast cancer, CHF, dementia, GERD, HLD, HTN, migraines, PNA   Clinical Impression  Pt admitted with above. Pt presents with decreased functional mobility secondary to poor balance, RLE weakness, decreased cognition. Requiring moderate assist for stand pivot transfers; unable to ambulate currently due to difficulty with weight shifting and progressing right lower extremity. Will benefit from CIR to address deficits and maximize functional mobility.      Follow Up Recommendations CIR;Supervision/Assistance - 24 hour    Equipment Recommendations  Rolling walker with 5" wheels;Wheelchair (measurements PT);Wheelchair cushion (measurements PT);3in1 (PT)    Recommendations for Other Services Rehab consult     Precautions / Restrictions Precautions Precautions: Fall Restrictions Weight Bearing Restrictions: No      Mobility  Bed Mobility Overal bed mobility: Needs Assistance Bed Mobility: Supine to Sit     Supine to sit: Mod assist     General bed mobility comments: Pt requiring increased time and cueing for initiation. ModA for RLE negotiation off edge of bed, trunk elevation and use of bed pad to scoot anteriorly  Transfers Overall transfer level: Needs assistance Equipment used: Rolling walker (2 wheeled);None Transfers: Sit to/from [employer] Sit to Stand: Mod assist Stand pivot transfers: Mod assist       General transfer comment: ModA to stand from edge of bed to recliner; pt unable to  weight shift to move RLE. Sat back down and performed face to face stand pivot transfer to recliner with modA   Ambulation/Gait             General Gait Details: unable  Stairs            Wheelchair Mobility    Modified Rankin (Stroke Patients Only) Modified Rankin (Stroke Patients Only) Pre-Morbid Rankin Score: Moderate disability Modified Rankin: Severe disability     Balance Overall balance assessment: Needs assistance Sitting-balance support: Feet supported Sitting balance-Leahy Scale: Poor Sitting balance - Comments: frequent loss of posterior balance   Standing balance support: Bilateral upper extremity supported Standing balance-Leahy Scale: Poor Standing balance comment: reliant on external support                             Pertinent Vitals/Pain Pain Assessment: No/denies pain    Home Living Family/patient expects to be discharged to:: Private residence Living Arrangements: Spouse/significant other Available Help at Discharge: Family;Available 24 hours/day Type of Home: House Home Access: Stairs to enter Entrance Stairs-Rails: Left Entrance Stairs-Number of Steps: 6 Home Layout: One level Home Equipment: Grab bars - toilet;Cane - single point;Crutches      Prior Function Level of Independence: Needs assistance      ADL's / Homemaking Assistance Needed: pt states she is independent, but per chart review, pt husband assisting with IADL's        Hand Dominance        Extremity/Trunk Assessment   Upper Extremity Assessment Upper Extremity Assessment: Defer to OT evaluation    Lower Extremity Assessment Lower Extremity Assessment: RLE deficits/detail;LLE deficits/detail RLE Deficits /  Details: Hip flexion 2/5, knee extension 4/5, ankle dorsiflexion 2/5 LLE Deficits / Details: WFL       Communication   Communication: No difficulties  Cognition Arousal/Alertness: Awake/alert Behavior During Therapy: WFL for tasks  assessed/performed Overall Cognitive Status: History of cognitive impairments - at baseline                                 General Comments: Pt A&Ox4, poor historian, significantly decreased awareness of deficits/safety. Follows 1 step commands consistently. Needs cues for initiation      General Comments      Exercises     Assessment/Plan    PT Assessment Patient needs continued PT services  PT Problem List Decreased strength;Decreased balance;Decreased activity tolerance;Decreased mobility;Decreased coordination;Decreased cognition;Decreased safety awareness       PT Treatment Interventions DME instruction;Gait training;Functional mobility training;Therapeutic activities;Stair training;Therapeutic exercise;Balance training;Patient/family education    PT Goals (Current goals can be found in the Care Plan section)  Acute Rehab PT Goals Patient Stated Goal: "get breakfast." PT Goal Formulation: With patient Time For Goal Achievement: 08/11/19 Potential to Achieve Goals: Good    Frequency Min 4X/week   Barriers to discharge        Co-evaluation               AM-PAC PT "6 Clicks" Mobility  Outcome Measure Help needed turning from your back to your side while in a flat bed without using bedrails?: A Little Help needed moving from lying on your back to sitting on the side of a flat bed without using bedrails?: A Lot Help needed moving to and from a bed to a chair (including a wheelchair)?: A Lot Help needed standing up from a chair using your arms (e.g., wheelchair or bedside chair)?: A Lot Help needed to walk in hospital room?: A Lot Help needed climbing 3-5 steps with a railing? : Total 6 Click Score: 12    End of Session Equipment Utilized During Treatment: Gait belt Activity Tolerance: Patient tolerated treatment well Patient left: in chair;with call bell/phone within reach;with chair alarm set Nurse Communication: Mobility status PT Visit  Diagnosis: Unsteadiness on feet (R26.81);Other abnormalities of gait and mobility (R26.89);Difficulty in walking, not elsewhere classified (R26.2);Other symptoms and signs involving the nervous system (R29.898)    Time: [id number] PT Time Calculation (min) (ACUTE ONLY): 24 min   Charges:   PT Evaluation $PT Eval Moderate Complexity: 1 Mod PT Treatments $Therapeutic Activity: 8-22 mins        Ellamae Sia, PT, DPT Acute Rehabilitation Services Pager [phone] Office [phone]   Willy Eddy 07/28/2019, 1:48 PM

## 2019-07-28 NOTE — Progress Notes (Signed)
STROKE TEAM PROGRESS NOTE   INTERVAL HISTORY Pt lying in bed awake and cooperative..  She still has significant right leg weakness.  She has no complaints.  Vital signs stable.  Blood pressure controlled.  Vitals:   07/28/19 0014 07/28/19 0340 07/28/19 0800 07/28/19 1326  BP: 128/65 (!) 142/84 122/69 124/74  Pulse: (!) 58 63  77  Resp: 20 18  (!) 22  Temp: 98 F (36.7 C) 98.3 F (36.8 C) 97.6 F (36.4 C) 98.1 F (36.7 C)  TempSrc:  Oral Oral Oral  SpO2: 98% 100% 97% 97%  Weight:        CBC:  Recent Labs  Lab 07/26/19 0524 07/26/19 0524 07/27/19 0651 07/28/19 0259  WBC 7.9   < > 10.4 9.6  NEUTROABS 5.5  --   --   --   HGB 14.8   < > 13.0 12.3  HCT 46.7*   < > 40.5 38.5  MCV 93.0   < > 92.0 93.0  PLT 273   < > 234 219   < > = values in this interval not displayed.    Basic Metabolic Panel:  Recent Labs  Lab 07/27/19 0651 07/28/19 0259  NA 141 140  K 3.8 4.1  CL 108 108  CO2 23 23  GLUCOSE 86 96  BUN 12 17  CREATININE 1.07* 1.14*  CALCIUM 8.3* 8.4*   Lipid Panel:     Component Value Date/Time   CHOL 159 07/27/2019 0651   TRIG 120 07/27/2019 0651   HDL 35 (L) 07/27/2019 0651   CHOLHDL 4.5 07/27/2019 0651   VLDL 24 07/27/2019 0651   LDLCALC 100 (H) 07/27/2019 0651   HgbA1c:  Lab Results  Component Value Date   HGBA1C 6.2 (H) 07/27/2019   Urine Drug Screen:     Component Value Date/Time   LABOPIA NONE DETECTED 12/12/2018 0114   COCAINSCRNUR NONE DETECTED 12/12/2018 0114   LABBENZ POSITIVE (A) 12/12/2018 0114   AMPHETMU NONE DETECTED 12/12/2018 0114   THCU NONE DETECTED 12/12/2018 0114   LABBARB NONE DETECTED 12/12/2018 0114    Alcohol Level     Component Value Date/Time   ETH <10 07/26/2019 0524    IMAGING past 48 hours CT HEAD WO CONTRAST  Result Date: 07/27/2019 CLINICAL DATA:  Stroke, follow-up. Noncontrast CT head, CT angiogram head/neck and CT perfusion 07/26/2019 EXAM: CT HEAD WITHOUT CONTRAST TECHNIQUE: Contiguous axial images were  obtained from the base of the skull through the vertex without intravenous contrast. COMPARISON:  Noncontrast CT head, CT angiogram head/neck and CT perfusion 07/26/2019 FINDINGS: Brain: There is loss of gray-white differentiation within portions of the anterior left frontal lobe, left ACA vascular territory, consistent with acute infarction (series 3, images 17-22) (series 5, images 15-21). No evidence of hemorrhagic conversion. No significant mass effect. No midline shift. No evidence of intracranial mass. No extra-axial fluid collection. Vascular: Hyperdense vessel. Skull: Normal. Negative for fracture or focal lesion. Sinuses/Orbits: Visualized orbits demonstrate no acute abnormality. Postsurgical appearance of the paranasal sinuses. No significant paranasal sinus disease or mastoid effusion at the imaged levels. IMPRESSION: Acute left frontal lobe ACA territory ischemic infarct. No hemorrhagic conversion. No significant mass effect. Electronically Signed   By: Kellie Simmering DO   On: 07/27/2019 07:41   MR MRA HEAD WO CONTRAST  Result Date: 07/28/2019 CLINICAL DATA:  Follow-up examination for acute stroke. EXAM: MRI HEAD WITHOUT CONTRAST MRA HEAD WITHOUT CONTRAST TECHNIQUE: Multiplanar, multiecho pulse sequences of the brain and surrounding structures were obtained  without intravenous contrast. Angiographic images of the head were obtained using MRA technique without contrast. COMPARISON:  Prior studies from 07/26/2019. FINDINGS: MRI HEAD FINDINGS Brain: Cerebral volume within normal limits for age. Mild chronic microvascular ischemic changes present within the periventricular deep white matter both cerebral hemispheres. Restricted diffusion involving the parasagittal anterior left frontal lobe compatible with acute left ACA territory infarct. Associated minimal petechial hemorrhage without hemorrhagic transformation or significant mass effect. Multiple additional scattered subcentimeter acute ischemic  infarcts seen involving the cortical and subcortical aspect of both cerebral hemispheres. For reference purposes, the largest of these foci measures 6 mm at the right occipital lobe. No associated hemorrhage or mass effect. Gray-white matter differentiation otherwise maintained. No other areas of remote cortical infarction. No other evidence for acute or chronic intracranial hemorrhage. No mass lesion, midline shift or mass effect. No hydrocephalus. No extra-axial fluid collection. Pituitary gland suprasellar region normal. Midline structures intact. Vascular: Major intracranial vascular flow voids are maintained. Skull and upper cervical spine: Craniocervical junction within normal limits. Bone marrow signal intensity normal. No scalp soft tissue abnormality. Sinuses/Orbits: Patient status post bilateral ocular lens replacement. Paranasal sinuses are largely clear. No significant mastoid effusion. Inner ear structures grossly normal. Other: None. MRA HEAD FINDINGS ANTERIOR CIRCULATION: Tortuosity of the distal cervical ICAs noted bilaterally. Internal carotid arteries widely patent to the termini without stenosis. Widely patent right A1. Absent left A1, accounting for the diminutive left ICA is compared to the right. Normal anterior communicating artery. There has been interval revascularization of previously seen short-segment left A2 occlusion, with the visualized ACAs now patent to their distal aspects. M1 segments widely patent. Normal MCA bifurcations. Distal MCA branches well perfused and symmetric. POSTERIOR CIRCULATION: Vertebral arteries widely patent to the vertebrobasilar junction without stenosis. Dominant left vertebral artery. Posterior inferior cerebral arteries patent bilaterally. Basilar patent to its distal aspect without stenosis. Superior cerebral arteries patent bilaterally. Both PCAs primarily supplied via the basilar and are well perfused to their distal aspects. No intracranial aneurysm.  IMPRESSION: MRI HEAD IMPRESSION: 1. Evolving acute ischemic left ACA territory infarct. Associated minimal petechial hemorrhage without hemorrhagic transformation or mass effect. 2. Additional scattered small volume subcentimeter cortical and subcortical ischemic infarcts involving both cerebral hemispheres. 3. Underlying mild chronic microvascular ischemic disease. MRA HEAD IMPRESSION: 1. Interval revascularization of previously seen short-segment left A2 occlusion. No large vessel occlusion now evident. 2. Mild atherosclerotic change elsewhere throughout the intracranial circulation. No other hemodynamically significant or correctable stenosis. Electronically Signed   By: Jeannine Boga M.D.   On: 07/28/2019 05:23   MR BRAIN WO CONTRAST  Result Date: 07/28/2019 CLINICAL DATA:  Follow-up examination for acute stroke. EXAM: MRI HEAD WITHOUT CONTRAST MRA HEAD WITHOUT CONTRAST TECHNIQUE: Multiplanar, multiecho pulse sequences of the brain and surrounding structures were obtained without intravenous contrast. Angiographic images of the head were obtained using MRA technique without contrast. COMPARISON:  Prior studies from 07/26/2019. FINDINGS: MRI HEAD FINDINGS Brain: Cerebral volume within normal limits for age. Mild chronic microvascular ischemic changes present within the periventricular deep white matter both cerebral hemispheres. Restricted diffusion involving the parasagittal anterior left frontal lobe compatible with acute left ACA territory infarct. Associated minimal petechial hemorrhage without hemorrhagic transformation or significant mass effect. Multiple additional scattered subcentimeter acute ischemic infarcts seen involving the cortical and subcortical aspect of both cerebral hemispheres. For reference purposes, the largest of these foci measures 6 mm at the right occipital lobe. No associated hemorrhage or mass effect. Gray-white matter differentiation otherwise maintained. No  other areas of  remote cortical infarction. No other evidence for acute or chronic intracranial hemorrhage. No mass lesion, midline shift or mass effect. No hydrocephalus. No extra-axial fluid collection. Pituitary gland suprasellar region normal. Midline structures intact. Vascular: Major intracranial vascular flow voids are maintained. Skull and upper cervical spine: Craniocervical junction within normal limits. Bone marrow signal intensity normal. No scalp soft tissue abnormality. Sinuses/Orbits: Patient status post bilateral ocular lens replacement. Paranasal sinuses are largely clear. No significant mastoid effusion. Inner ear structures grossly normal. Other: None. MRA HEAD FINDINGS ANTERIOR CIRCULATION: Tortuosity of the distal cervical ICAs noted bilaterally. Internal carotid arteries widely patent to the termini without stenosis. Widely patent right A1. Absent left A1, accounting for the diminutive left ICA is compared to the right. Normal anterior communicating artery. There has been interval revascularization of previously seen short-segment left A2 occlusion, with the visualized ACAs now patent to their distal aspects. M1 segments widely patent. Normal MCA bifurcations. Distal MCA branches well perfused and symmetric. POSTERIOR CIRCULATION: Vertebral arteries widely patent to the vertebrobasilar junction without stenosis. Dominant left vertebral artery. Posterior inferior cerebral arteries patent bilaterally. Basilar patent to its distal aspect without stenosis. Superior cerebral arteries patent bilaterally. Both PCAs primarily supplied via the basilar and are well perfused to their distal aspects. No intracranial aneurysm. IMPRESSION: MRI HEAD IMPRESSION: 1. Evolving acute ischemic left ACA territory infarct. Associated minimal petechial hemorrhage without hemorrhagic transformation or mass effect. 2. Additional scattered small volume subcentimeter cortical and subcortical ischemic infarcts involving both cerebral  hemispheres. 3. Underlying mild chronic microvascular ischemic disease. MRA HEAD IMPRESSION: 1. Interval revascularization of previously seen short-segment left A2 occlusion. No large vessel occlusion now evident. 2. Mild atherosclerotic change elsewhere throughout the intracranial circulation. No other hemodynamically significant or correctable stenosis. Electronically Signed   By: Jeannine Boga M.D.   On: 07/28/2019 05:23   ECHOCARDIOGRAM COMPLETE  Result Date: 07/26/2019   ECHOCARDIOGRAM REPORT   Patient Name:   Kylie Tucker Date of Exam: 07/26/2019 Medical Rec #:  [id number]        Height:       62.0 in Accession #:    [id number]       Weight:       183.1 lb Date of Birth:  06-14-1951        BSA:          1.84 m Patient Age:    68 years         BP:           121/51 mmHg Patient Gender: F                HR:           66 bpm. Exam Location:  Inpatient Procedure: 2D Echo, Cardiac Doppler and Color Doppler Indications:    Stroke 434.91  History:        Patient has prior history of Echocardiogram examinations, most                 recent 04/06/2018. CHF, Stroke, Arrythmias:LBBB,                 Signs/Symptoms:Chest Pain and Dyspnea; Risk                 Factors:Hypertension, Dyslipidemia and Non-Smoker. Elevated                 troponin.  Sonographer:    Paulita Fujita RDCS Referring Phys: [place]  1. Left  ventricular ejection fraction, by visual estimation, is 25 to 30%. The left ventricle has moderate to severely decreased function. Left ventricular septal wall thickness was normal. Normal left ventricular posterior wall thickness. There is no left ventricular hypertrophy.  2. The left ventricle demonstrates global hypokinesis.  3. Global right ventricle has normal systolic function.The right ventricular size is normal. No increase in right ventricular wall thickness.  4. Left atrial size was mildly dilated.  5. Right atrial size was normal.  6. Large pleural effusion in the left  lateral region.  7. Small pericardial effusion.  8. The pericardial effusion is posterior to the left ventricle.  9. The mitral valve is normal in structure. Mild mitral valve regurgitation. No evidence of mitral stenosis. 10. The tricuspid valve is normal in structure. 11. The aortic valve is tricuspid. Aortic valve regurgitation is not visualized. No evidence of aortic valve sclerosis or stenosis. 12. Pulmonic regurgitation is mild. 13. The pulmonic valve was normal in structure. Pulmonic valve regurgitation is mild. 14. Moderately elevated pulmonary artery systolic pressure. 15. The inferior vena cava is normal in size with <50% respiratory variability, suggesting right atrial pressure of 8 mmHg. FINDINGS  Left Ventricle: Left ventricular ejection fraction, by visual estimation, is 25 to 30%. The left ventricle has moderate to severely decreased function. The left ventricle demonstrates global hypokinesis. Normal left ventricular posterior wall thickness.  There is no left ventricular hypertrophy. Normal left atrial pressure. Right Ventricle: The right ventricular size is normal. No increase in right ventricular wall thickness. Global RV systolic function is has normal systolic function. The tricuspid regurgitant velocity is 3.11 m/s, and with an assumed right atrial pressure  of 8 mmHg, the estimated right ventricular systolic pressure is moderately elevated at 46.7 mmHg. Left Atrium: Left atrial size was mildly dilated. Right Atrium: Right atrial size was normal in size Pericardium: A small pericardial effusion is present. The pericardial effusion is posterior to the left ventricle. There is a large pleural effusion in the left lateral region. Mitral Valve: The mitral valve is normal in structure. Mild mitral valve regurgitation. No evidence of mitral valve stenosis by observation. Tricuspid Valve: The tricuspid valve is normal in structure. Tricuspid valve regurgitation is mild. Aortic Valve: The aortic valve  is tricuspid. Aortic valve regurgitation is not visualized. The aortic valve is structurally normal, with no evidence of sclerosis or stenosis. Pulmonic Valve: The pulmonic valve was normal in structure. Pulmonic valve regurgitation is mild. Pulmonic regurgitation is mild. Aorta: The aortic root, ascending aorta and aortic arch are all structurally normal, with no evidence of dilitation or obstruction. Venous: The inferior vena cava is normal in size with less than 50% respiratory variability, suggesting right atrial pressure of 8 mmHg. IAS/Shunts: No atrial level shunt detected by color flow Doppler. There is no evidence of a patent foramen ovale. No ventricular septal defect is seen or detected. There is no evidence of an atrial septal defect.  LEFT VENTRICLE PLAX 2D LVIDd:         4.90 cm LVIDs:         4.40 cm LV PW:         0.80 cm LV IVS:        0.80 cm LVOT diam:     1.60 cm LV SV:         25 ml LV SV Index:   12.94 LVOT Area:     2.01 cm  LV Volumes (MOD) LV area d, A2C:    48.70 cm  LV area d, A4C:    47.40 cm LV area s, A2C:    36.00 cm LV area s, A4C:    38.30 cm LV major d, A2C:   9.76 cm LV major d, A4C:   9.44 cm LV major s, A2C:   8.58 cm LV major s, A4C:   8.60 cm LV vol d, MOD A2C: 203.0 ml LV vol d, MOD A4C: 197.0 ml LV vol s, MOD A2C: 132.0 ml LV vol s, MOD A4C: 140.0 ml LV SV MOD A2C:     71.0 ml LV SV MOD A4C:     197.0 ml LV SV MOD BP:      67.8 ml RIGHT VENTRICLE RV S prime:     10.20 cm/s TAPSE (M-mode): 2.1 cm LEFT ATRIUM           Index       RIGHT ATRIUM           Index LA diam:      3.70 cm 2.01 cm/m  RA Area:     16.20 cm LA Vol (A4C): 53.6 ml 29.11 ml/m RA Volume:   43.10 ml  23.41 ml/m  AORTIC VALVE LVOT Vmax:   110.00 cm/s LVOT Vmean:  75.800 cm/s LVOT VTI:    0.237 m  AORTA Ao Root diam: 3.10 cm MITRAL VALVE                        TRICUSPID VALVE MV Area (PHT): 4.15 cm             TV Peak grad:   23.8 mmHg MV PHT:        53.07 msec           TV Vmax:        2.44 m/s MV Decel  Time: 183 msec             TR Peak grad:   38.7 mmHg MR Peak grad: 90.4 mmHg             TR Vmax:        311.00 cm/s MR Vmax:      475.50 cm/s MV E velocity: 90.80 cm/s 103 cm/s  SHUNTS MV A velocity: 79.70 cm/s 70.3 cm/s Systemic VTI:  0.24 m MV E/A ratio:  1.14       1.5       Systemic Diam: 1.60 cm  Skeet Latch MD Electronically signed by Skeet Latch MD Signature Date/Time: 07/26/2019/7:27:24 PM    Final    Cerebral Angio 07/26/2019 S/P 4 vessel cerebral arteriogram followed by complete revascularization of occluded Lt ACA A2 seg via RT ICA approach ,with x 1 pass with trevoprovue 70mm x 19mm retriever and penumbra aspiration achieving a TICI 3 revascularization.   PHYSICAL EXAM  Temp:  [97.5 F (36.4 C)-98.3 F (36.8 C)] 98.1 F (36.7 C) (01/16 1326) Pulse Rate:  [58-92] 77 (01/16 1326) Resp:  [16-27] 22 (01/16 1326) BP: (114-150)/(56-84) 124/74 (01/16 1326) SpO2:  [95 %-100 %] 97 % (01/16 1326)  General -frail elderly Caucasian lady, in no apparent distress.  Ophthalmologic - fundi not visualized due to noncooperation.  Cardiovascular - Regular rhythm and rate.  Mental Status -  Level of arousal and orientation to time, place, and person were intact. Language including expression, naming, repetition, comprehension was assessed and found intact. Fund of Knowledge was assessed and was diminished  Cranial Nerves II - XII - II - Visual field intact OU.  III, IV, VI - Extraocular movements intact. V - Facial sensation intact bilaterally. VII - Facial movement intact bilaterally. VIII - Hearing & vestibular intact bilaterally. X - Palate elevates symmetrically. XI - Chin turning & shoulder shrug intact bilaterally. XII - Tongue protrusion intact.  Motor Strength - The patient's strength was normal in all extremities except right leg weakness with right foot DF and PF 3-/5 and right knee flexion 4/5 and pronator drift was absent.  Bulk was normal and fasciculations were  absent.   Motor Tone - Muscle tone was assessed at the neck and appendages and was normal.  Reflexes - The patient's reflexes were symmetrical in all extremities and she had no pathological reflexes.  Sensory - Light touch, temperature/pinprick were assessed and were symmetrical.    Coordination - The patient had normal movements in the hands with no ataxia or dysmetria.  Tremor was absent.  Gait and Station - deferred.   ASSESSMENT/PLAN Kylie Tucker is a 69 y.o. female with history of HTN, HLD, migraines, breast cancer, CHF, dementia, GERD presenting to [facility] ED with R sided weakness. treated w/ tPA 1/14 at 0600. Found to have  L A2 occlusion and transferred to [facility] for IR.   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   CTA head & neck L A2 embolism w/ short segment occlusion. Cardiomyopathy and layering pleural effusions. B ICA looping in neck. L A1 aplastic.   CT perfusion 8cc core, 22cc penumbra  Pre IR CT following neuro changes without ICH post tPA   Cerebral angio L ACA A2 occlusion s/p TICI3 revascularization   CT head L frontal lobe ACA infarct   MRI - 07/28/19 - Evolving acute ischemic left ACA territory infarct. Associated minimal petechial hemorrhage without hemorrhagic transformation or mass effect. Additional scattered small volume subcentimeter cortical and subcortical ischemic infarcts involving both cerebral hemispheres. Underlying mild chronic microvascular ischemic disease.  MRA - 07/28/19 - Interval revascularization of previously seen short-segment left A2 occlusion. No large vessel occlusion now evident. Mild atherosclerotic change elsewhere throughout the intracranial circulation. No other hemodynamically significant or correctable stenosis.  2D Echo EF 25-30%. No LV thrombus    LDL 100  HgbA1c 6.2  Lovenox 40 mg sq daily for VTE prophylaxis  No antithrombotic prior to admission, now on plavix 75mg  daily (ASA allergy). Given low EF, will recommend anticoagulation with NOAC for stroke prevention if MRI did not show large infarct.     Therapy recommendations:  CIR recommended  Disposition:  pending   Postop Acute Respiratory Failure  Intubated for IR  Left intubated post IR as not fully responsive  CCM on board   Extubated later in day without difficulties  Cardiomyopathy  Acute on chronic combined systolic and diastolic congestive heart failure  NICM w/ EF 25-30%, on home Lasix and metoprolol  03/2018 EF 40 to 45%  Patient stated that she follows with Dr. Einar Gip  Continue metoprolol  Hypertension  Home meds:  toprol 50  Off cleviprex  Stable   On norvasc 5 and home metoprolol . Long-term BP goal normotensive  Hyperlipidemia  Home meds:  No statin   LDL 100, goal < 70  Intolerance to crestor  Add pravastatin 40  Continue statin at discharge  Other Stroke Risk Factors  Advanced age  Obesity, Body mass index is 33.49 kg/m., recommend weight loss, diet and exercise as appropriate   Migraines  Other Active Problems  Hx breast cancer w/ R effusion, pretracheal adenopathy - warrants evaluation once stable  AKI Cre 1.18->1.07->1.14  Hospital day # 2  Plan consult cardiology to address diminished ejection fraction and need for anticoagulation.  Mobilize out of bed.  Continue ongoing therapies and rehab consult.  Likely transfer to inpatient rehab over the next few days if stable.  Discussed with Dr. Virgina Jock cardiology greater than 50% time during this 25-minute visit was spent on counseling and coordination of care and discussion with care team Antony Contras, MD To contact Stroke Continuity provider, please refer to [email or web address] After hours, contact General Neurology

## 2019-07-29 DIAGNOSIS — I429 Cardiomyopathy, unspecified: Secondary | ICD-10-CM | POA: Diagnosis not present

## 2019-07-29 DIAGNOSIS — I5020 Unspecified systolic (congestive) heart failure: Secondary | ICD-10-CM

## 2019-07-29 DIAGNOSIS — I63422 Cerebral infarction due to embolism of left anterior cerebral artery: Secondary | ICD-10-CM | POA: Diagnosis not present

## 2019-07-29 LAB — CBC
HCT: 39.7 % (ref 36.0–46.0)
Hemoglobin: 12.6 g/dL (ref 12.0–15.0)
MCH: 29.4 pg (ref 26.0–34.0)
MCHC: 31.7 g/dL (ref 30.0–36.0)
MCV: 92.5 fL (ref 80.0–100.0)
Platelets: 225 10*3/uL (ref 150–400)
RBC: 4.29 MIL/uL (ref 3.87–5.11)
RDW: 15.5 % (ref 11.5–15.5)
WBC: 7.7 10*3/uL (ref 4.0–10.5)
nRBC: 0 % (ref 0.0–0.2)

## 2019-07-29 LAB — GLUCOSE, CAPILLARY
Glucose-Capillary: 152 mg/dL — ABNORMAL HIGH (ref 70–99)
Glucose-Capillary: 169 mg/dL — ABNORMAL HIGH (ref 70–99)

## 2019-07-29 LAB — BRAIN NATRIURETIC PEPTIDE: B Natriuretic Peptide: 1409.9 pg/mL — ABNORMAL HIGH (ref 0.0–100.0)

## 2019-07-29 LAB — BASIC METABOLIC PANEL
Anion gap: 9 (ref 5–15)
BUN: 22 mg/dL (ref 8–23)
CO2: 26 mmol/L (ref 22–32)
Calcium: 8.8 mg/dL — ABNORMAL LOW (ref 8.9–10.3)
Chloride: 105 mmol/L (ref 98–111)
Creatinine, Ser: 1.28 mg/dL — ABNORMAL HIGH (ref 0.44–1.00)
GFR calc Af Amer: 50 mL/min — ABNORMAL LOW (ref >60)
GFR calc non Af Amer: 43 mL/min — ABNORMAL LOW (ref >60)
Glucose, Bld: 112 mg/dL — ABNORMAL HIGH (ref 70–99)
Potassium: 3.3 mmol/L — ABNORMAL LOW (ref 3.5–5.1)
Sodium: 140 mmol/L (ref 135–145)

## 2019-07-29 MED ORDER — SIMVASTATIN 20 MG PO TABS
20.0000 mg | ORAL_TABLET | Freq: Every day | ORAL | Status: DC
  Administered 2019-07-31: 20 mg via ORAL
  Filled 2019-07-29 (×3): qty 1

## 2019-07-29 NOTE — Progress Notes (Signed)
STROKE TEAM PROGRESS NOTE   INTERVAL HISTORY Pt lying in bed awake and cooperative..  She states her  right leg weakness is improving.  She has no complaints.  Vital signs stable.  Blood pressure controlled.Await cardiology input MRI scan of the brain done yesterday shows left ACA infarct with minimal petechial hemorrhage and tiny punctate subcortical and cortical infarcts in both hemispheres as well. Vitals:   07/28/19 2349 07/29/19 0329 07/29/19 0928 07/29/19 0935  BP: 117/65 (!) 113/56 115/61   Pulse: 60 (!) 58    Resp: 20 19 18    Temp: 98.7 F (37.1 C) 98.2 F (36.8 C) 98.5 F (36.9 C)   TempSrc: Oral Oral Oral   SpO2: 98% 96% 100% 95%  Weight:        CBC:  Recent Labs  Lab 07/26/19 0524 07/27/19 0651 07/28/19 0259 07/29/19 0707  WBC 7.9   < > 9.6 7.7  NEUTROABS 5.5  --   --   --   HGB 14.8   < > 12.3 12.6  HCT 46.7*   < > 38.5 39.7  MCV 93.0   < > 93.0 92.5  PLT 273   < > 219 225   < > = values in this interval not displayed.    Basic Metabolic Panel:  Recent Labs  Lab 07/28/19 0259 07/29/19 0707  NA 140 140  K 4.1 3.3*  CL 108 105  CO2 23 26  GLUCOSE 96 112*  BUN 17 22  CREATININE 1.14* 1.28*  CALCIUM 8.4* 8.8*   Lipid Panel:     Component Value Date/Time   CHOL 159 07/27/2019 0651   TRIG 120 07/27/2019 0651   HDL 35 (L) 07/27/2019 0651   CHOLHDL 4.5 07/27/2019 0651   VLDL 24 07/27/2019 0651   LDLCALC 100 (H) 07/27/2019 0651   HgbA1c:  Lab Results  Component Value Date   HGBA1C 6.2 (H) 07/27/2019   Urine Drug Screen:     Component Value Date/Time   LABOPIA NONE DETECTED 12/12/2018 0114   COCAINSCRNUR NONE DETECTED 12/12/2018 0114   LABBENZ POSITIVE (A) 12/12/2018 0114   AMPHETMU NONE DETECTED 12/12/2018 0114   THCU NONE DETECTED 12/12/2018 0114   LABBARB NONE DETECTED 12/12/2018 0114    Alcohol Level     Component Value Date/Time   ETH <10 07/26/2019 0524    IMAGING past 48 hours MR MRA HEAD WO CONTRAST  Result Date:  07/28/2019 CLINICAL DATA:  Follow-up examination for acute stroke. EXAM: MRI HEAD WITHOUT CONTRAST MRA HEAD WITHOUT CONTRAST TECHNIQUE: Multiplanar, multiecho pulse sequences of the brain and surrounding structures were obtained without intravenous contrast. Angiographic images of the head were obtained using MRA technique without contrast. COMPARISON:  Prior studies from 07/26/2019. FINDINGS: MRI HEAD FINDINGS Brain: Cerebral volume within normal limits for age. Mild chronic microvascular ischemic changes present within the periventricular deep white matter both cerebral hemispheres. Restricted diffusion involving the parasagittal anterior left frontal lobe compatible with acute left ACA territory infarct. Associated minimal petechial hemorrhage without hemorrhagic transformation or significant mass effect. Multiple additional scattered subcentimeter acute ischemic infarcts seen involving the cortical and subcortical aspect of both cerebral hemispheres. For reference purposes, the largest of these foci measures 6 mm at the right occipital lobe. No associated hemorrhage or mass effect. Gray-white matter differentiation otherwise maintained. No other areas of remote cortical infarction. No other evidence for acute or chronic intracranial hemorrhage. No mass lesion, midline shift or mass effect. No hydrocephalus. No extra-axial fluid collection. Pituitary gland suprasellar  region normal. Midline structures intact. Vascular: Major intracranial vascular flow voids are maintained. Skull and upper cervical spine: Craniocervical junction within normal limits. Bone marrow signal intensity normal. No scalp soft tissue abnormality. Sinuses/Orbits: Patient status post bilateral ocular lens replacement. Paranasal sinuses are largely clear. No significant mastoid effusion. Inner ear structures grossly normal. Other: None. MRA HEAD FINDINGS ANTERIOR CIRCULATION: Tortuosity of the distal cervical ICAs noted bilaterally. Internal  carotid arteries widely patent to the termini without stenosis. Widely patent right A1. Absent left A1, accounting for the diminutive left ICA is compared to the right. Normal anterior communicating artery. There has been interval revascularization of previously seen short-segment left A2 occlusion, with the visualized ACAs now patent to their distal aspects. M1 segments widely patent. Normal MCA bifurcations. Distal MCA branches well perfused and symmetric. POSTERIOR CIRCULATION: Vertebral arteries widely patent to the vertebrobasilar junction without stenosis. Dominant left vertebral artery. Posterior inferior cerebral arteries patent bilaterally. Basilar patent to its distal aspect without stenosis. Superior cerebral arteries patent bilaterally. Both PCAs primarily supplied via the basilar and are well perfused to their distal aspects. No intracranial aneurysm. IMPRESSION: MRI HEAD IMPRESSION: 1. Evolving acute ischemic left ACA territory infarct. Associated minimal petechial hemorrhage without hemorrhagic transformation or mass effect. 2. Additional scattered small volume subcentimeter cortical and subcortical ischemic infarcts involving both cerebral hemispheres. 3. Underlying mild chronic microvascular ischemic disease. MRA HEAD IMPRESSION: 1. Interval revascularization of previously seen short-segment left A2 occlusion. No large vessel occlusion now evident. 2. Mild atherosclerotic change elsewhere throughout the intracranial circulation. No other hemodynamically significant or correctable stenosis. Electronically Signed   By: Jeannine Boga M.D.   On: 07/28/2019 05:23   MR BRAIN WO CONTRAST  Result Date: 07/28/2019 CLINICAL DATA:  Follow-up examination for acute stroke. EXAM: MRI HEAD WITHOUT CONTRAST MRA HEAD WITHOUT CONTRAST TECHNIQUE: Multiplanar, multiecho pulse sequences of the brain and surrounding structures were obtained without intravenous contrast. Angiographic images of the head were  obtained using MRA technique without contrast. COMPARISON:  Prior studies from 07/26/2019. FINDINGS: MRI HEAD FINDINGS Brain: Cerebral volume within normal limits for age. Mild chronic microvascular ischemic changes present within the periventricular deep white matter both cerebral hemispheres. Restricted diffusion involving the parasagittal anterior left frontal lobe compatible with acute left ACA territory infarct. Associated minimal petechial hemorrhage without hemorrhagic transformation or significant mass effect. Multiple additional scattered subcentimeter acute ischemic infarcts seen involving the cortical and subcortical aspect of both cerebral hemispheres. For reference purposes, the largest of these foci measures 6 mm at the right occipital lobe. No associated hemorrhage or mass effect. Gray-white matter differentiation otherwise maintained. No other areas of remote cortical infarction. No other evidence for acute or chronic intracranial hemorrhage. No mass lesion, midline shift or mass effect. No hydrocephalus. No extra-axial fluid collection. Pituitary gland suprasellar region normal. Midline structures intact. Vascular: Major intracranial vascular flow voids are maintained. Skull and upper cervical spine: Craniocervical junction within normal limits. Bone marrow signal intensity normal. No scalp soft tissue abnormality. Sinuses/Orbits: Patient status post bilateral ocular lens replacement. Paranasal sinuses are largely clear. No significant mastoid effusion. Inner ear structures grossly normal. Other: None. MRA HEAD FINDINGS ANTERIOR CIRCULATION: Tortuosity of the distal cervical ICAs noted bilaterally. Internal carotid arteries widely patent to the termini without stenosis. Widely patent right A1. Absent left A1, accounting for the diminutive left ICA is compared to the right. Normal anterior communicating artery. There has been interval revascularization of previously seen short-segment left A2  occlusion, with the visualized ACAs now patent  to their distal aspects. M1 segments widely patent. Normal MCA bifurcations. Distal MCA branches well perfused and symmetric. POSTERIOR CIRCULATION: Vertebral arteries widely patent to the vertebrobasilar junction without stenosis. Dominant left vertebral artery. Posterior inferior cerebral arteries patent bilaterally. Basilar patent to its distal aspect without stenosis. Superior cerebral arteries patent bilaterally. Both PCAs primarily supplied via the basilar and are well perfused to their distal aspects. No intracranial aneurysm. IMPRESSION: MRI HEAD IMPRESSION: 1. Evolving acute ischemic left ACA territory infarct. Associated minimal petechial hemorrhage without hemorrhagic transformation or mass effect. 2. Additional scattered small volume subcentimeter cortical and subcortical ischemic infarcts involving both cerebral hemispheres. 3. Underlying mild chronic microvascular ischemic disease. MRA HEAD IMPRESSION: 1. Interval revascularization of previously seen short-segment left A2 occlusion. No large vessel occlusion now evident. 2. Mild atherosclerotic change elsewhere throughout the intracranial circulation. No other hemodynamically significant or correctable stenosis. Electronically Signed   By: Jeannine Boga M.D.   On: 07/28/2019 05:23   Cerebral Angio 07/26/2019 S/P 4 vessel cerebral arteriogram followed by complete revascularization of occluded Lt ACA A2 seg via RT ICA approach ,with x 1 pass with trevoprovue 33mm x 62mm retriever and penumbra aspiration achieving a TICI 3 revascularization.   PHYSICAL EXAM  Temp:  [98.1 F (36.7 C)-98.8 F (37.1 C)] 98.5 F (36.9 C) (01/17 0928) Pulse Rate:  [58-77] 58 (01/17 0329) Resp:  [17-22] 18 (01/17 0928) BP: (113-135)/(56-75) 115/61 (01/17 0928) SpO2:  [95 %-100 %] 95 % (01/17 0935)  General -frail elderly Caucasian lady, in no apparent distress.  Ophthalmologic - fundi not visualized due to  noncooperation.  Cardiovascular - Regular rhythm and rate.  Mental Status -  Level of arousal and orientation to time, place, and person were intact. Language including expression, naming, repetition, comprehension was assessed and found intact. Fund of Knowledge was assessed and was diminished  Cranial Nerves II - XII - II - Visual field intact OU. III, IV, VI - Extraocular movements intact. V - Facial sensation intact bilaterally. VII - Facial movement intact bilaterally. VIII - Hearing & vestibular intact bilaterally. X - Palate elevates symmetrically. XI - Chin turning & shoulder shrug intact bilaterally. XII - Tongue protrusion intact.  Motor Strength - The patient's strength was normal in all extremities except right leg weakness with right foot DF and PF 3-/5 and right knee flexion 4/5 and pronator drift was present.  Bulk was normal and fasciculations were absent.   Motor Tone - Muscle tone was assessed at the neck and appendages and was normal.  Reflexes - The patient's reflexes were symmetrical in all extremities and she had no pathological reflexes.  Sensory - Light touch, temperature/pinprick were assessed and were symmetrical.    Coordination - The patient had normal movements in the hands with no ataxia or dysmetria.  Tremor was absent.  Gait and Station - deferred.   ASSESSMENT/PLAN Ms. Kylie Tucker is a 69 y.o. female with history of HTN, HLD, migraines, breast cancer, CHF, dementia, GERD presenting to [facility] ED with R sided weakness. treated w/ tPA 1/14 at 0600. Found to have  L A2 occlusion and transferred to [facility] for IR.   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   CTA head & neck L A2 embolism w/ short segment occlusion. Cardiomyopathy and layering pleural effusions. B  ICA  looping in neck. L A1 aplastic.   CT perfusion 8cc core, 22cc penumbra  Pre IR CT following neuro changes without ICH post tPA   Cerebral angio L ACA A2 occlusion s/p TICI3 revascularization   CT head L frontal lobe ACA infarct   MRI - 07/28/19 - Evolving acute ischemic left ACA territory infarct. Associated minimal petechial hemorrhage without hemorrhagic transformation or mass effect. Additional scattered small volume subcentimeter cortical and subcortical ischemic infarcts involving both cerebral hemispheres. Underlying mild chronic microvascular ischemic disease.  MRA - 07/28/19 - Interval revascularization of previously seen short-segment left A2 occlusion. No large vessel occlusion now evident. Mild atherosclerotic change elsewhere throughout the intracranial circulation. No other hemodynamically significant or correctable stenosis.  2D Echo EF 25-30%. No LV thrombus   LDL 100  HgbA1c 6.2  Lovenox 40 mg sq daily for VTE prophylaxis  No antithrombotic prior to admission, now on plavix 75mg  daily (ASA allergy). Given low EF, will recommend anticoagulation with NOAC for stroke prevention if MRI did not show large infarct.     Therapy recommendations:  CIR recommended  Disposition:  pending   Postop Acute Respiratory Failure  Intubated for IR  Left intubated post IR as not fully responsive  CCM on board   Extubated later in day without difficulties  Cardiomyopathy  Acute on chronic combined systolic and diastolic congestive heart failure  NICM w/ EF 25-30%, on home Lasix and metoprolol  03/2018 EF 40 to 45%  Patient stated that she follows with Dr. Einar Gip  Continue metoprolol  Hypertension  Home meds:  toprol 50  Off cleviprex  Stable   On norvasc 5 and home metoprolol . Long-term BP goal normotensive  Hyperlipidemia  Home meds:  No statin   LDL 100, goal < 70  Intolerance to crestor  Add pravastatin 40  Continue statin at discharge  Other Stroke  Risk Factors  Advanced age  Obesity, Body mass index is 33.49 kg/m., recommend weight loss, diet and exercise as appropriate   Migraines  Other Active Problems  Hx breast cancer w/ R effusion, pretracheal adenopathy - warrants evaluation once stable  AKI Cre 1.18->1.07->1.14  Hospital day # 3  Plan consult cardiology to address diminished ejection fraction and need for anticoagulation.  Mobilize out of bed.  Continue ongoing therapies and rehab consult.  Likely transfer to inpatient rehab over the next few days if stable.  Discussed with Dr. Virgina Jock cardiology greater than 50% time during this 25-minute visit was spent on counseling and coordination of care and discussion with care team Antony Contras, MD To contact Stroke Continuity provider, please refer to [email or web address] After hours, contact General Neurology

## 2019-07-29 NOTE — Consult Note (Addendum)
CARDIOLOGY CONSULT NOTE  Patient ID: IWONA FORESTIER MRN: [id number] DOB/AGE: [birth year] 69 y.o.  Admit date: 07/26/2019 Referring Physician: Neurology service Reason for Consultation: Cardiomyopathy  HPI:   69 y.o. Caucasian female  with h/o breast cancer treated with surgery + chemoradiation (including adriamycin), hypertension, hyperlipidemia, dementia, admitted with right leg weakness and inattention. 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 Transthoracic echocardiogram showed EF 20-25%, no definite KV thrombus. Patient has minimal neurodeficit.  At baseline, she denies any exertional dyspnea, orthopnea, but has occasional leg edema. Denies exertional chest pain.She states that she had "pneumonia" recently and was treated with doxycycline by her PCP. She denies having had fevers, but had shortness of breath.  She has not seen Dr. Einar Gip in nearly two years.   Past Medical History:  Diagnosis Date  . Allergy   . Anxiety   . Bilateral ovarian cysts    Do yearly ultrasound.  . Cancer ([place]) rt breast   2004/ chemo/ radiation  . Cellulitis and abscess of upper arm and forearm   . Chronic combined systolic and diastolic CHF (congestive heart failure) ([place])   . Dementia ([place])   . Family history of breast cancer   . Family history of rectal cancer   . GERD (gastroesophageal reflux disease)   . H/O Helicobacter infection 2017  . History of right mastectomy 2004   chemo/ radiation  . Hyperlipidemia   . Hypertension   . Insomnia   . Left bundle branch block 2016  . Migraines   . Moderate mitral regurgitation   . NICM (nonischemic cardiomyopathy) ([place])   . Pneumonia   . PONV (postoperative nausea and vomiting)    cannot take zofran  . Postmastectomy lymphedema rt side  . Sentinel node      Past Surgical History:  Procedure Laterality Date  . BREAST EXCISIONAL BIOPSY Left    benign  . CARPAL TUNNEL  RELEASE    . CATARACT EXTRACTION Left 07/12/2013  . MANDIBLE SURGERY  1991  . MASTECTOMY Right    malignant  . NASAL SINUS SURGERY  1990/2001  . ORIF FINGER FRACTURE  02/14/2012   Procedure: OPEN REDUCTION INTERNAL FIXATION (ORIF) METACARPAL (FINGER) FRACTURE;  Surgeon: Wynonia Sours, MD;  Location: [facility];  Service: Orthopedics;  Laterality: Right;  RIGHT FIFTH   . ORIF METACARPAL FRACTURE  8/13   rt   . ORIF PATELLA Right 11/07/2018   Procedure: OPEN REDUCTION INTERNAL (ORIF) FIXATION PATELLA;  Surgeon: Nicholes Stairs, MD;  Location: [facility];  Service: Orthopedics;  Laterality: Right;  . ORIF PATELLA FRACTURE Right 11/07/2018  . PORT-A-CATH REMOVAL     in and now out  . RADIOLOGY WITH ANESTHESIA N/A 07/26/2019   Procedure: RADIOLOGY WITH ANESTHESIA;  Surgeon: Luanne Bras, MD;  Location: [facility];  Service: Radiology;  Laterality: N/A;  . REPAIR EXTENSOR TENDON Left 05/30/2013   Procedure: RELEASE TRANSPOSITION EXTENSOR POLLICUS LONGUS LEFT WRIST;  Surgeon: Wynonia Sours, MD;  Location: [facility];  Service: Orthopedics;  Laterality: Left;  . rt mastectomy  2004   lymph nodes-7-axillary node dissection  . SHOULDER ARTHROSCOPY WITH ROTATOR CUFF REPAIR AND SUBACROMIAL DECOMPRESSION Left 11/22/2013   Procedure: LEFT SHOULDER ARTHROSCOPY WITH SUBACROMIAL DECOMPRESSION DISTAL CLAVICLE RESECTION REPAIR  ROTATOR CUFF AS NEEDED ;  Surgeon: Cammie Sickle., MD;  Location: [facility];  Service: Orthopedics;  Laterality: Left;  .  WRIST FRACTURE SURGERY       Family History  Problem Relation Age of Onset  . Heart disease Father   . Heart disease Mother   . Hypertension Mother   . Rectal cancer Mother        dx in her early 34s  . Colon polyps Mother   . Breast cancer Maternal Aunt        dx in her early 74s  . Heart disease Paternal Uncle   . Heart disease Paternal Grandmother   . Heart disease Paternal Grandfather   . Diabetes Brother    . Breast cancer Maternal Grandmother        dx in her 50s  . Breast cancer Cousin        dx in early 16s  . Breast cancer Cousin 75  . Hepatitis C Son   . Hypertension Son   . Post-traumatic stress disorder Son      Social History: Social History   Socioeconomic History  . Marital status: Married    Spouse name: Not on file  . Number of children: Not on file  . Years of education: Not on file  . Highest education level: Not on file  Occupational History  . Occupation: retired  Tobacco Use  . Smoking status: Never Smoker  . Smokeless tobacco: Never Used  Substance and Sexual Activity  . Alcohol use: No    Alcohol/week: 0.0 standard drinks    Comment: rarely  . Drug use: No  . Sexual activity: Not Currently    Partners: Male    Birth control/protection: Post-menopausal  Other Topics Concern  . Not on file  Social History Narrative  . Not on file   Social Determinants of Health   Financial Resource Strain:   . Difficulty of Paying Living Expenses: Not on file  Food Insecurity:   . Worried About Charity fundraiser in the Last Year: Not on file  . Ran Out of Food in the Last Year: Not on file  Transportation Needs:   . Lack of Transportation (Medical): Not on file  . Lack of Transportation (Non-Medical): Not on file  Physical Activity:   . Days of Exercise per Week: Not on file  . Minutes of Exercise per Session: Not on file  Stress:   . Feeling of Stress : Not on file  Social Connections:   . Frequency of Communication with Friends and Family: Not on file  . Frequency of Social Gatherings with Friends and Family: Not on file  . Attends Religious Services: Not on file  . Active Member of Clubs or Organizations: Not on file  . Attends Archivist Meetings: Not on file  . Marital Status: Not on file  Intimate Partner Violence:   . Fear of Current or Ex-Partner: Not on file  . Emotionally Abused: Not on file  . Physically Abused: Not on file  .  Sexually Abused: Not on file     Medications Prior to Admission  Medication Sig Dispense Refill Last Dose  . albuterol (VENTOLIN HFA) 108 (90 Base) MCG/ACT inhaler Inhale 1-2 puffs into the lungs every 6 (six) hours as needed for wheezing or shortness of breath. 18 g 0 Past Week at Unknown time  . diazepam (VALIUM) 10 MG tablet Take 10 mg by mouth at bedtime.    07/25/2019  . doxycycline (VIBRAMYCIN) 100 MG capsule Take 100 mg by mouth 2 (two) times daily. For ten days   07/25/2019  . metoprolol  succinate (TOPROL-XL) 50 MG 24 hr tablet Take 50 mg by mouth at bedtime.    07/25/2019 at 2000  . Multiple Vitamin (MULTIVITAMIN WITH MINERALS) TABS tablet Take 1 tablet by mouth daily.   07/25/2019  . triazolam (HALCION) 0.25 MG tablet Take 0.25 mg by mouth at bedtime.    07/25/2019    Review of Systems  Constitution: Negative for decreased appetite, malaise/fatigue, weight gain and weight loss.  HENT: Negative for congestion.   Eyes: Negative for visual disturbance.  Cardiovascular: Negative for chest pain, dyspnea on exertion, leg swelling, palpitations and syncope.  Respiratory: Negative for cough.   Endocrine: Negative for cold intolerance.  Hematologic/Lymphatic: Does not bruise/bleed easily.  Skin: Negative for itching and rash.  Musculoskeletal: Negative for myalgias.  Gastrointestinal: Negative for abdominal pain, nausea and vomiting.  Genitourinary: Negative for dysuria.  Neurological: Negative for dizziness and weakness.       Stroke symptoms resolved.  Psychiatric/Behavioral: The patient is not nervous/anxious.   All other systems reviewed and are negative.     Physical Exam: Physical Exam  Constitutional: She is oriented to person, place, and time. She appears well-developed and well-nourished. No distress.  HENT:  Head: Normocephalic and atraumatic.  Eyes: Pupils are equal, round, and reactive to light. Conjunctivae are normal.  Neck: No JVD present.  Cardiovascular: Normal  rate, regular rhythm and intact distal pulses.  No murmur heard. Pulmonary/Chest: Effort normal and breath sounds normal. She has no wheezes. She has no rales.  Abdominal: Soft. Bowel sounds are normal. There is no rebound.  Musculoskeletal:        General: No edema.  Lymphadenopathy:    She has no cervical adenopathy.  Neurological: She is alert and oriented to person, place, and time. No cranial nerve deficit.  Skin: Skin is warm and dry.  Psychiatric:  Flat affect  Nursing note and vitals reviewed.    Labs:   Lab Results  Component Value Date   WBC 7.7 07/29/2019   HGB 12.6 07/29/2019   HCT 39.7 07/29/2019   MCV 92.5 07/29/2019   PLT 225 07/29/2019    Recent Labs  Lab 07/26/19 0524 07/27/19 0651 07/29/19 0707  NA 143   < > 140  K 3.7   < > 3.3*  CL 108   < > 105  CO2 25   < > 26  BUN 18   < > 22  CREATININE 1.18*   < > 1.28*  CALCIUM 8.9   < > 8.8*  PROT 6.5  --   --   BILITOT 0.9  --   --   ALKPHOS 64  --   --   ALT 30  --   --   AST 34  --   --   GLUCOSE 132*   < > 112*   < > = values in this interval not displayed.    Lipid Panel     Component Value Date/Time   CHOL 159 07/27/2019 0651   TRIG 120 07/27/2019 0651   HDL 35 (L) 07/27/2019 0651   CHOLHDL 4.5 07/27/2019 0651   VLDL 24 07/27/2019 0651   LDLCALC 100 (H) 07/27/2019 0651    BNP (last 3 results) No results for input(s): BNP in the last 8760 hours.  HEMOGLOBIN A1C Lab Results  Component Value Date   HGBA1C 6.2 (H) 07/27/2019   MPG 131.24 07/27/2019    Cardiac Panel (last 3 results) No results for input(s): CKTOTAL, CKMB, RELINDX in the last 8760 hours.  Invalid input(s): TROPONINHS  No results found for: CKTOTAL, CKMB, CKMBINDEX   TSH No results for input(s): TSH in the last 8760 hours.    Radiology: MR MRA HEAD WO CONTRAST  Result Date: 07/28/2019 CLINICAL DATA:  Follow-up examination for acute stroke. EXAM: MRI HEAD WITHOUT CONTRAST MRA HEAD WITHOUT CONTRAST TECHNIQUE:  Multiplanar, multiecho pulse sequences of the brain and surrounding structures were obtained without intravenous contrast. Angiographic images of the head were obtained using MRA technique without contrast. COMPARISON:  Prior studies from 07/26/2019. FINDINGS: MRI HEAD FINDINGS Brain: Cerebral volume within normal limits for age. Mild chronic microvascular ischemic changes present within the periventricular deep white matter both cerebral hemispheres. Restricted diffusion involving the parasagittal anterior left frontal lobe compatible with acute left ACA territory infarct. Associated minimal petechial hemorrhage without hemorrhagic transformation or significant mass effect. Multiple additional scattered subcentimeter acute ischemic infarcts seen involving the cortical and subcortical aspect of both cerebral hemispheres. For reference purposes, the largest of these foci measures 6 mm at the right occipital lobe. No associated hemorrhage or mass effect. Gray-white matter differentiation otherwise maintained. No other areas of remote cortical infarction. No other evidence for acute or chronic intracranial hemorrhage. No mass lesion, midline shift or mass effect. No hydrocephalus. No extra-axial fluid collection. Pituitary gland suprasellar region normal. Midline structures intact. Vascular: Major intracranial vascular flow voids are maintained. Skull and upper cervical spine: Craniocervical junction within normal limits. Bone marrow signal intensity normal. No scalp soft tissue abnormality. Sinuses/Orbits: Patient status post bilateral ocular lens replacement. Paranasal sinuses are largely clear. No significant mastoid effusion. Inner ear structures grossly normal. Other: None. MRA HEAD FINDINGS ANTERIOR CIRCULATION: Tortuosity of the distal cervical ICAs noted bilaterally. Internal carotid arteries widely patent to the termini without stenosis. Widely patent right A1. Absent left A1, accounting for the diminutive  left ICA is compared to the right. Normal anterior communicating artery. There has been interval revascularization of previously seen short-segment left A2 occlusion, with the visualized ACAs now patent to their distal aspects. M1 segments widely patent. Normal MCA bifurcations. Distal MCA branches well perfused and symmetric. POSTERIOR CIRCULATION: Vertebral arteries widely patent to the vertebrobasilar junction without stenosis. Dominant left vertebral artery. Posterior inferior cerebral arteries patent bilaterally. Basilar patent to its distal aspect without stenosis. Superior cerebral arteries patent bilaterally. Both PCAs primarily supplied via the basilar and are well perfused to their distal aspects. No intracranial aneurysm. IMPRESSION: MRI HEAD IMPRESSION: 1. Evolving acute ischemic left ACA territory infarct. Associated minimal petechial hemorrhage without hemorrhagic transformation or mass effect. 2. Additional scattered small volume subcentimeter cortical and subcortical ischemic infarcts involving both cerebral hemispheres. 3. Underlying mild chronic microvascular ischemic disease. MRA HEAD IMPRESSION: 1. Interval revascularization of previously seen short-segment left A2 occlusion. No large vessel occlusion now evident. 2. Mild atherosclerotic change elsewhere throughout the intracranial circulation. No other hemodynamically significant or correctable stenosis. Electronically Signed   By: Jeannine Boga M.D.   On: 07/28/2019 05:23   MR BRAIN WO CONTRAST  Result Date: 07/28/2019 CLINICAL DATA:  Follow-up examination for acute stroke. EXAM: MRI HEAD WITHOUT CONTRAST MRA HEAD WITHOUT CONTRAST TECHNIQUE: Multiplanar, multiecho pulse sequences of the brain and surrounding structures were obtained without intravenous contrast. Angiographic images of the head were obtained using MRA technique without contrast. COMPARISON:  Prior studies from 07/26/2019. FINDINGS: MRI HEAD FINDINGS Brain: Cerebral  volume within normal limits for age. Mild chronic microvascular ischemic changes present within the periventricular deep white matter both cerebral hemispheres. Restricted diffusion involving the parasagittal  anterior left frontal lobe compatible with acute left ACA territory infarct. Associated minimal petechial hemorrhage without hemorrhagic transformation or significant mass effect. Multiple additional scattered subcentimeter acute ischemic infarcts seen involving the cortical and subcortical aspect of both cerebral hemispheres. For reference purposes, the largest of these foci measures 6 mm at the right occipital lobe. No associated hemorrhage or mass effect. Gray-white matter differentiation otherwise maintained. No other areas of remote cortical infarction. No other evidence for acute or chronic intracranial hemorrhage. No mass lesion, midline shift or mass effect. No hydrocephalus. No extra-axial fluid collection. Pituitary gland suprasellar region normal. Midline structures intact. Vascular: Major intracranial vascular flow voids are maintained. Skull and upper cervical spine: Craniocervical junction within normal limits. Bone marrow signal intensity normal. No scalp soft tissue abnormality. Sinuses/Orbits: Patient status post bilateral ocular lens replacement. Paranasal sinuses are largely clear. No significant mastoid effusion. Inner ear structures grossly normal. Other: None. MRA HEAD FINDINGS ANTERIOR CIRCULATION: Tortuosity of the distal cervical ICAs noted bilaterally. Internal carotid arteries widely patent to the termini without stenosis. Widely patent right A1. Absent left A1, accounting for the diminutive left ICA is compared to the right. Normal anterior communicating artery. There has been interval revascularization of previously seen short-segment left A2 occlusion, with the visualized ACAs now patent to their distal aspects. M1 segments widely patent. Normal MCA bifurcations. Distal MCA  branches well perfused and symmetric. POSTERIOR CIRCULATION: Vertebral arteries widely patent to the vertebrobasilar junction without stenosis. Dominant left vertebral artery. Posterior inferior cerebral arteries patent bilaterally. Basilar patent to its distal aspect without stenosis. Superior cerebral arteries patent bilaterally. Both PCAs primarily supplied via the basilar and are well perfused to their distal aspects. No intracranial aneurysm. IMPRESSION: MRI HEAD IMPRESSION: 1. Evolving acute ischemic left ACA territory infarct. Associated minimal petechial hemorrhage without hemorrhagic transformation or mass effect. 2. Additional scattered small volume subcentimeter cortical and subcortical ischemic infarcts involving both cerebral hemispheres. 3. Underlying mild chronic microvascular ischemic disease. MRA HEAD IMPRESSION: 1. Interval revascularization of previously seen short-segment left A2 occlusion. No large vessel occlusion now evident. 2. Mild atherosclerotic change elsewhere throughout the intracranial circulation. No other hemodynamically significant or correctable stenosis. Electronically Signed   By: Jeannine Boga M.D.   On: 07/28/2019 05:23    Scheduled Meds: . ALPRAZolam  3 mg Oral QHS  . amLODipine  5 mg Oral Daily  . Chlorhexidine Gluconate Cloth  6 each Topical Daily  . clopidogrel  75 mg Oral Daily  . enoxaparin (LOVENOX) injection  40 mg Subcutaneous Q24H  . furosemide  20 mg Oral Daily  . labetalol  20 mg Intravenous Once  . metoprolol succinate  50 mg Oral QHS  . pantoprazole  40 mg Oral Daily  . pravastatin  40 mg Oral q1800   Continuous Infusions: PRN Meds:.albuterol, nitroGLYCERIN, senna-docusate  CARDIAC STUDIES:  EKG 06/25/2018: Sinus rhythm. Left atrial enlargement. LBBB. No EKG on file this hospitalization. No Afib seen on telemetry.  Echocardiogram 07/26/2019:  1. Left ventricular ejection fraction, by visual estimation, is 25 to 30%. The left  ventricle has moderate to severely decreased function. Left ventricular septal wall thickness was normal. Normal left ventricular posterior wall thickness. There is no left ventricular hypertrophy.  2. The left ventricle demonstrates global hypokinesis.  3. Global right ventricle has normal systolic function.The right ventricular size is normal. No increase in right ventricular wall thickness.  4. Left atrial size was mildly dilated.  5. Right atrial size was normal.  6. Large pleural effusion in  the left lateral region.  7. Small pericardial effusion.  8. The pericardial effusion is posterior to the left ventricle.  9. The mitral valve is normal in structure. Mild mitral valve regurgitation. No evidence of mitral stenosis. 10. The tricuspid valve is normal in structure. 11. The aortic valve is tricuspid. Aortic valve regurgitation is not visualized. No evidence of aortic valve sclerosis or stenosis. 12. Pulmonic regurgitation is mild. 13. The pulmonic valve was normal in structure. Pulmonic valve regurgitation is mild. 14. Moderately elevated pulmonary artery systolic pressure. 15. The inferior vena cava is normal in size with <50% respiratory variability, suggesting right atrial pressure of 8 mmHg.   Assessment & Recommendations:  69 y.o. Caucasian female  with h/o breast cancer treated with surgery + chemoradiation (including adriamycin), hypertension, hyperlipidemia, dementia, L ACA stroke s/p TICI revascularization, cardiomyopathy.  Cardiomyopathy: EF 20-25%. I personally reviewed and compared echocardiogram on 07/26/2019 and 2019. I do not think there is any significant change in her LVEF and I believe it was always 20-25%. Cardioembolic stroke remains in the differential, but no definite etiology identified. I do not think TEE will change management, as suspicion for PFO is low and TEE will not be superior to TTE to identify LV thrombus. Loop recorder is reasonable to look for Afib. She  will be on plavix given recent revascularization.  She is fairly euvolumic. Idiopathy remains wide, including chemotherapy induced, hypertensive, ischemic. Could consider outpatient ischemic workup. She has mild increase in Cr. Will start [facility] outpatient. Continue metoprolol succinate.   Will follow up outpatient.   Nigel Mormon, MD 07/29/2019, 11:54 AM [facility] Pager: [phone] Office: [phone] If no answer Cell [phone]

## 2019-07-30 ENCOUNTER — Other Ambulatory Visit: Payer: Self-pay | Admitting: Physician Assistant

## 2019-07-30 ENCOUNTER — Telehealth: Payer: Self-pay | Admitting: Cardiology

## 2019-07-30 DIAGNOSIS — I4891 Unspecified atrial fibrillation: Secondary | ICD-10-CM

## 2019-07-30 DIAGNOSIS — I639 Cerebral infarction, unspecified: Secondary | ICD-10-CM

## 2019-07-30 LAB — BASIC METABOLIC PANEL WITH GFR
Anion gap: 12 (ref 5–15)
BUN: 20 mg/dL (ref 8–23)
CO2: 27 mmol/L (ref 22–32)
Calcium: 8.6 mg/dL — ABNORMAL LOW (ref 8.9–10.3)
Chloride: 102 mmol/L (ref 98–111)
Creatinine, Ser: 1.29 mg/dL — ABNORMAL HIGH (ref 0.44–1.00)
GFR calc Af Amer: 49 mL/min — ABNORMAL LOW
GFR calc non Af Amer: 43 mL/min — ABNORMAL LOW
Glucose, Bld: 111 mg/dL — ABNORMAL HIGH (ref 70–99)
Potassium: 3.4 mmol/L — ABNORMAL LOW (ref 3.5–5.1)
Sodium: 141 mmol/L (ref 135–145)

## 2019-07-30 LAB — CBC
HCT: 44.3 % (ref 36.0–46.0)
Hemoglobin: 14.4 g/dL (ref 12.0–15.0)
MCH: 29.3 pg (ref 26.0–34.0)
MCHC: 32.5 g/dL (ref 30.0–36.0)
MCV: 90 fL (ref 80.0–100.0)
Platelets: 263 10*3/uL (ref 150–400)
RBC: 4.92 MIL/uL (ref 3.87–5.11)
RDW: 15.1 % (ref 11.5–15.5)
WBC: 8.9 10*3/uL (ref 4.0–10.5)
nRBC: 0 % (ref 0.0–0.2)

## 2019-07-30 MED ORDER — POTASSIUM CHLORIDE CRYS ER 20 MEQ PO TBCR
20.0000 meq | EXTENDED_RELEASE_TABLET | Freq: Two times a day (BID) | ORAL | Status: AC
  Administered 2019-07-30 (×2): 20 meq via ORAL
  Filled 2019-07-30 (×2): qty 1

## 2019-07-30 NOTE — Progress Notes (Signed)
PT Cancellation Note  Patient Details Name: Kylie Tucker MRN: [id number] DOB: March 15, 1951   Cancelled Treatment:    Reason Eval/Treat Not Completed: (P) Other (comment)(Pt eating will f/u per POC.)   Olubunmi Rothenberger Eli Hose 07/30/2019, 3:34 PM Erasmo Leventhal , PTA Acute Rehabilitation Services Pager [phone] Office [phone]

## 2019-07-30 NOTE — Consult Note (Addendum)
ELECTROPHYSIOLOGY CONSULT NOTE  Patient ID: Kylie Tucker MRN: [id number], DOB/AGE: 03-06-1951   Admit date: 07/26/2019 Date of Consult: 07/30/2019  Primary Physician: Redmond School, MD Primary Cardiologist: Dr. Einar Gip Reason for Consultation: Cryptogenic stroke ; recommendations regarding Implantable Loop Recorder, requested by Dr. Virgina Jock  History of Present Illness Kylie Tucker was admitted on 07/26/2019 with R sided weakness, stroke, initially at [facility] transferred to [facility], has had tPA and emergent IR, revascularization.    PMHx includes: HTN, HLD, migraines, breast ca, dementia, GERD, NICM  Neurology notes L ACA infarct with L A2 occlusion s/p tPA and IR w/ TICIs L A2 reperfusion. MRI - 07/28/19 - Evolving acute ischemic left ACA territory infarct. Associated minimal petechial hemorrhage without hemorrhagic transformation or mass effect. Additional scattered small volume subcentimeter cortical and subcortical ischemic infarcts involving both cerebral hemispheres. Underlying mild chronic microvascular ischemic disease.   she has undergone workup for stroke including echocardiogram and carotid angio.  The patient has been monitored on telemetry which has demonstrated sinus rhythm with no arrhythmias.  Neurology and general cardiology have deferred TEE.   Echocardiogram this admission demonstrated  IMPRESSIONS  1. Left ventricular ejection fraction, by visual estimation, is 25 to 30%. The left ventricle has moderate to severely decreased function. Left ventricular septal wall thickness was normal. Normal left ventricular posterior wall thickness. There is no  left ventricular hypertrophy.  2. The left ventricle demonstrates global hypokinesis.  3. Global right ventricle has normal systolic function.The right ventricular size is normal. No increase in right ventricular wall thickness.  4. Left atrial size was mildly dilated.  5. Right atrial size was normal.  6. Large  pleural effusion in the left lateral region.  7. Small pericardial effusion.  8. The pericardial effusion is posterior to the left ventricle.  9. The mitral valve is normal in structure. Mild mitral valve regurgitation. No evidence of mitral stenosis. 10. The tricuspid valve is normal in structure. 11. The aortic valve is tricuspid. Aortic valve regurgitation is not visualized. No evidence of aortic valve sclerosis or stenosis. 12. Pulmonic regurgitation is mild. 13. The pulmonic valve was normal in structure. Pulmonic valve regurgitation is mild. 14. Moderately elevated pulmonary artery systolic pressure. 15. The inferior vena cava is normal in size with <50% respiratory variability, suggesting right atrial pressure of 8 mmHg.    Lab work is reviewed.   Prior to admission, the patient denies chest pain, shortness of breath, dizziness, palpitations, or syncope.  She is quite somnolent, she opens her eyes to verbal, answers my questions, though eyes drift back closed and seems to fall asleep again.  She follows commands, though is very difficult to keep her attention.  She tells me she has slept very poorly and minimally here.  She knows she is here for stroke, AAO self, place, and situation.   Plans are for rehab at discharge.   Past Medical History:  Diagnosis Date   Allergy    Anxiety    Bilateral ovarian cysts    Do yearly ultrasound.   Cancer ([place]) rt breast   2004/ chemo/ radiation   Cellulitis and abscess of upper arm and forearm    Chronic combined systolic and diastolic CHF (congestive heart failure) ([place])    Dementia (HCC)    Family history of breast cancer    Family history of rectal cancer    GERD (gastroesophageal reflux disease)    H/O Helicobacter infection 2017   History of right mastectomy 2004  chemo/ radiation   Hyperlipidemia    Hypertension    Insomnia    Left bundle branch block 2016   Migraines    Moderate mitral regurgitation    NICM (nonischemic  cardiomyopathy) (HCC)    Pneumonia    PONV (postoperative nausea and vomiting)    cannot take zofran   Postmastectomy lymphedema rt side   Sentinel node      Surgical History:  Past Surgical History:  Procedure Laterality Date   BREAST EXCISIONAL BIOPSY Left    benign   CARPAL TUNNEL RELEASE     CATARACT EXTRACTION Left 07/12/2013   IR PERCUTANEOUS ART THROMBECTOMY/INFUSION INTRACRANIAL INC DIAG ANGIO  07/26/2019   MANDIBLE SURGERY  1991   MASTECTOMY Right    malignant   NASAL SINUS SURGERY  1990/2001   ORIF FINGER FRACTURE  02/14/2012   Procedure: OPEN REDUCTION INTERNAL FIXATION (ORIF) METACARPAL (FINGER) FRACTURE;  Surgeon: Wynonia Sours, MD;  Location: [place];  Service: Orthopedics;  Laterality: Right;  RIGHT FIFTH    ORIF METACARPAL FRACTURE  8/13   rt    ORIF PATELLA Right 11/07/2018   Procedure: OPEN REDUCTION INTERNAL (ORIF) FIXATION PATELLA;  Surgeon: Nicholes Stairs, MD;  Location: [place];  Service: Orthopedics;  Laterality: Right;   ORIF PATELLA FRACTURE Right 11/07/2018   PORT-A-CATH REMOVAL     in and now out   [place] N/A 07/26/2019   Procedure: RADIOLOGY WITH ANESTHESIA;  Surgeon: Luanne Bras, MD;  Location: [place];  Service: Radiology;  Laterality: N/A;   REPAIR EXTENSOR TENDON Left 05/30/2013   Procedure: RELEASE TRANSPOSITION EXTENSOR POLLICUS LONGUS LEFT WRIST;  Surgeon: Wynonia Sours, MD;  Location: [place];  Service: Orthopedics;  Laterality: Left;   rt mastectomy  2004   lymph nodes-7-axillary node dissection   SHOULDER ARTHROSCOPY WITH ROTATOR CUFF REPAIR AND SUBACROMIAL DECOMPRESSION Left 11/22/2013   Procedure: LEFT SHOULDER ARTHROSCOPY WITH SUBACROMIAL DECOMPRESSION DISTAL CLAVICLE RESECTION REPAIR  ROTATOR CUFF AS NEEDED ;  Surgeon: Cammie Sickle., MD;  Location: [place];  Service: Orthopedics;  Laterality: Left;   WRIST FRACTURE SURGERY       Medications Prior to  Admission  Medication Sig Dispense Refill Last Dose   albuterol (VENTOLIN HFA) 108 (90 Base) MCG/ACT inhaler Inhale 1-2 puffs into the lungs every 6 (six) hours as needed for wheezing or shortness of breath. 18 g 0 Past Week at Unknown time   diazepam (VALIUM) 10 MG tablet Take 10 mg by mouth at bedtime.    07/25/2019   doxycycline (VIBRAMYCIN) 100 MG capsule Take 100 mg by mouth 2 (two) times daily. For ten days   07/25/2019   metoprolol succinate (TOPROL-XL) 50 MG 24 hr tablet Take 50 mg by mouth at bedtime.    07/25/2019 at 2000   Multiple Vitamin (MULTIVITAMIN WITH MINERALS) TABS tablet Take 1 tablet by mouth daily.   07/25/2019   triazolam (HALCION) 0.25 MG tablet Take 0.25 mg by mouth at bedtime.    07/25/2019    Inpatient Medications:   ALPRAZolam  3 mg Oral QHS   amLODipine  5 mg Oral Daily   Chlorhexidine Gluconate Cloth  6 each Topical Daily   clopidogrel  75 mg Oral Daily   enoxaparin (LOVENOX) injection  40 mg Subcutaneous Q24H   furosemide  20 mg Oral Daily   metoprolol succinate  50 mg Oral QHS   pantoprazole  40 mg Oral Daily   simvastatin  20 mg Oral q1800    Allergies:  Allergies  Allergen Reactions   Clonazepam Shortness Of Breath and Other (See Comments)    Heart had a "weird feeling" and difficulty breathing   Hydrocodone-Acetaminophen Nausea And Vomiting    GI Upset (intolerance) Patient stated 4/28 that she is able to take Hydrocodone   Rosuvastatin Other (See Comments)    myalgia   Trazodone And Nefazodone Nausea Only and Other (See Comments)    Felt like "I was burning all over" -patient   Acetaminophen Nausea Only   Amoxicillin-Pot Clavulanate Nausea Only    flushing Did it involve swelling of the face/tongue/throat, SOB, or low BP? No Did it involve sudden or severe rash/hives, skin peeling, or any reaction on the inside of your mouth or nose? No Did you need to seek medical attention at a hospital or doctor's office? Unknown When did it last happen?       5 years If all above answers are NO, may proceed with cephalosporin use.    Cefuroxime Axetil Nausea Only   Cephalexin Nausea Only   Levofloxacin Nausea Only   Morphine And Related Other (See Comments)    Hallucinations   Ondansetron Nausea And Vomiting   Propoxyphene N-Acetaminophen Nausea And Vomiting   Sulfonamide Derivatives Itching   Vioxx [Rofecoxib] Nausea And Vomiting   Aspirin Nausea And Vomiting    Gastrointestinal upset   Azithromycin Palpitations   Clarithromycin Nausea Only and Anxiety   Codeine Itching and Rash   Milk-Related Compounds Nausea And Vomiting   Seroquel [Quetiapine Fumarate] Palpitations    Heart racing    Sulfamethoxazole Rash   Tramadol Nausea And Vomiting    Social History   Socioeconomic History   Marital status: Married    Spouse name: Not on file   Number of children: Not on file   Years of education: Not on file   Highest education level: Not on file  Occupational History   Occupation: retired  Tobacco Use   Smoking status: Never Smoker   Smokeless tobacco: Never Used  Substance and Sexual Activity   Alcohol use: No    Alcohol/week: 0.0 standard drinks    Comment: rarely   Drug use: No   Sexual activity: Not Currently    Partners: Male    Birth control/protection: Post-menopausal  Other Topics Concern   Not on file  Social History Narrative   Not on file   Social Determinants of Health   Financial Resource Strain:    Difficulty of Paying Living Expenses: Not on file  Food Insecurity:    Worried About Estate manager/land agent of Food in the Last Year: Not on file   [employer] of Food in the Last Year: Not on file  Transportation Needs:    Lack of Transportation (Medical): Not on file   Lack of Transportation (Non-Medical): Not on file  Physical Activity:    Days of Exercise per Week: Not on file   Minutes of Exercise per Session: Not on file  Stress:    Feeling of Stress : Not on file  Social Connections:    Frequency of  Communication with Friends and Family: Not on file   Frequency of Social Gatherings with Friends and Family: Not on file   Attends Religious Services: Not on file   Active Member of Clubs or Organizations: Not on file   Attends Archivist Meetings: Not on file   Marital Status: Not on file  Intimate Partner Violence:  Fear of Current or Ex-Partner: Not on file   Emotionally Abused: Not on file   Physically Abused: Not on file   Sexually Abused: Not on file     Family History  Problem Relation Age of Onset   Heart disease Father    Heart disease Mother    Hypertension Mother    Rectal cancer Mother        dx in her early 44s   Colon polyps Mother    Breast cancer Maternal Aunt        dx in her early 18s   Heart disease Paternal Uncle    Heart disease Paternal Grandmother    Heart disease Paternal Grandfather    Diabetes Brother    Breast cancer Maternal Grandmother        dx in her 55s   Breast cancer Cousin        dx in early 64s   Breast cancer Cousin 85   Hepatitis C Son    Hypertension Son    Post-traumatic stress disorder Son       Review of Systems: All other systems reviewed and are otherwise negative except as noted above.  Physical Exam: Vitals:   07/29/19 1943 07/30/19 0000 07/30/19 0404 07/30/19 0902  BP: (!) 161/77 (!) 162/82 140/68 (!) 120/56  Pulse: 79 85 79 63  Resp: 19 19 18  (!) 25  Temp: 100.3 F (37.9 C) 100.1 F (37.8 C) 99.4 F (37.4 C) 99.3 F (37.4 C)  TempSrc: Oral Oral Oral Oral  SpO2: 92% 93% 92% 98%  Weight:        GEN- The patient is well appearing, alert and oriented x 3 today.   Head- normocephalic, atraumatic Eyes-  Sclera clear, conjunctiva pink Ears- hearing intact Oropharynx- clear Neck- supple Lungs- CTA b/l, normal work of breathing Heart-  RRR, no murmurs, rubs or gallops  GI- soft, NT, ND Extremities- no clubbing, cyanosis, or edema MS- no significant deformity or atrophy Skin- no rash or  lesion Psych- flat affect  She drifts to sleep/closes her eyes, hard to keep her attention, have discussion   Labs:   Lab Results  Component Value Date   WBC 8.9 07/30/2019   HGB 14.4 07/30/2019   HCT 44.3 07/30/2019   MCV 90.0 07/30/2019   PLT 263 07/30/2019    Recent Labs  Lab 07/26/19 0524 07/27/19 0651 07/30/19 0224  NA 143   < > 141  K 3.7   < > 3.4*  CL 108   < > 102  CO2 25   < > 27  BUN 18   < > 20  CREATININE 1.18*   < > 1.29*  CALCIUM 8.9   < > 8.6*  PROT 6.5  --   --   BILITOT 0.9  --   --   ALKPHOS 64  --   --   ALT 30  --   --   AST 34  --   --   GLUCOSE 132*   < > 111*   < > = values in this interval not displayed.   Lab Results  Component Value Date   TROPONINI 0.10 ([place]) 04/06/2018   Lab Results  Component Value Date   CHOL 159 07/27/2019   CHOL 186 12/25/2015   CHOL 225 (H) 05/04/2010   Lab Results  Component Value Date   HDL 35 (L) 07/27/2019   HDL 48 12/25/2015   HDL 38.20 (L) 05/04/2010   Lab Results  Component  Value Date   LDLCALC 100 (H) 07/27/2019   LDLCALC 117 12/25/2015   Lab Results  Component Value Date   TRIG 120 07/27/2019   TRIG 103 12/25/2015   TRIG 232.0 (H) 05/04/2010   Lab Results  Component Value Date   CHOLHDL 4.5 07/27/2019   CHOLHDL 3.9 12/25/2015   CHOLHDL 6 05/04/2010   Lab Results  Component Value Date   LDLDIRECT 145.4 05/04/2010   LDLDIRECT 220.5 10/01/2009    Lab Results  Component Value Date   DDIMER 0.42 09/19/2016     Radiology/Studies:   CT ANGIO HEAD W OR WO CONTRAST Result Date: 07/26/2019 CLINICAL DATA:  In attention and right leg weakness, sudden onset. EXAM: CT ANGIOGRAPHY HEAD AND NECK CT PERFUSION BRAIN TECHNIQUE: Multidetector CT imaging of the head and neck was performed using the standard protocol during bolus administration of intravenous contrast. Multiplanar CT image reconstructions and MIPs were obtained to evaluate the vascular anatomy. Carotid stenosis measurements (when  applicable) are obtained utilizing NASCET criteria, using the distal internal carotid diameter as the denominator. Multiphase CT imaging of the brain was performed following IV bolus contrast injection. Subsequent parametric perfusion maps were calculated using RAPID software. CONTRAST:  133mL OMNIPAQUE IOHEXOL 350 MG/ML SOLN COMPARISON:  Noncontrast head CT earlier today FINDINGS: CTA NECK FINDINGS Aortic arch: Mild atheromatous changes. Three vessel branching. No dilatation. Right carotid system: No noted atheromatous changes. ICA tortuosity with loop. Left carotid system: Smaller vessel size to circle-of-Willis variant. ICA looping at the skull base. No noted atheromatous changes, stenosis, or ulceration. Vertebral arteries: No proximal subclavian stenosis or atherosclerosis. Mild left vertebral artery dominance. Both vertebral arteries are smooth and widely patent to the dura. Skeleton: Cervical spine degeneration and bilateral mandibular osteoplasty Other neck: No acute finding Upper chest: Layering pleural effusions. Cardiomegaly on preceding x-ray. Review of the MIP images confirms the above findings CTA HEAD FINDINGS Anterior circulation: Aplastic left A1 segment. Left A2 short segment occlusion with distal reconstitution, acute appearing and likely embolic. Negative for aneurysm Posterior circulation: Smooth vertebral and basilar arteries. No PCA branch occlusion. Venous sinuses: Negative for aneurysm. Anatomic variants: As above Review of the MIP images confirms the above findings CT Brain Perfusion Findings: ASPECTS: 10 CBF (<30%) Volume: 62mL Perfusion (Tmax>6.0s) volume: 67mL Mismatch Volume: 52mL Infarction Location:Left frontal These results were called by telephone at the time of interpretation on 07/26/2019 at 7:49 am to provider Pollina , who verbally acknowledged these results. IMPRESSION: 1. Left A2 embolism with short segment occlusion. There is an associated 8 cc core infarct and 22 cc of  penumbra. 2. Minimal atheromatous changes. There is however cardiomegaly and layering pleural effusions, question cardiac source. 3. Bilateral ICA looping in the neck.  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 Electronically Signed   By: Monte Fantasia M.D.   On: 07/26/2019 07:53     DG Chest 2 View Result Date: 07/13/2019 CLINICAL DATA:  Patient c/o cough, sob, wheezing, congestion x 3 weeks. Hx of htn, rt side breast cancer w/ mastectomy, pneumonia. Non smoker EXAM: CHEST - 2 VIEW COMPARISON:  Chest radiograph 04/05/2018 FINDINGS: Stable cardiomediastinal contours with mildly enlarged heart size. Central venous congestion. There are new heterogeneous opacities at the right greater than left lung bases and small volume right pleural fluid. Linear opacities in the lingula likely represents scarring. There is scattered atelectasis. No pneumothorax. No acute finding in the visualized skeleton. IMPRESSION: New heterogeneous opacities at the right greater than left lung bases could represent atelectasis, infiltrate not  excluded. Scattered atelectasis. Small volume right pleural effusion. Electronically Signed   By: Audie Pinto M.D.   On: 07/13/2019 17:37     CT HEAD WO CONTRAST Result Date: 07/27/2019 CLINICAL DATA:  Stroke, follow-up. Noncontrast CT head, CT angiogram head/neck and CT perfusion 07/26/2019 EXAM: CT HEAD WITHOUT CONTRAST TECHNIQUE: Contiguous axial images were obtained from the base of the skull through the vertex without intravenous contrast. COMPARISON:  Noncontrast CT head, CT angiogram head/neck and CT perfusion 07/26/2019 FINDINGS: Brain: There is loss of gray-white differentiation within portions of the anterior left frontal lobe, left ACA vascular territory, consistent with acute infarction (series 3, images 17-22) (series 5, images 15-21). No evidence of hemorrhagic conversion. No significant mass effect. No midline shift. No evidence of intracranial mass. No extra-axial fluid collection.  Vascular: Hyperdense vessel. Skull: Normal. Negative for fracture or focal lesion. Sinuses/Orbits: Visualized orbits demonstrate no acute abnormality. Postsurgical appearance of the paranasal sinuses. No significant paranasal sinus disease or mastoid effusion at the imaged levels. IMPRESSION: Acute left frontal lobe ACA territory ischemic infarct. No hemorrhagic conversion. No significant mass effect. Electronically Signed   By: Kellie Simmering DO   On: 07/27/2019 07:41     MR MRA HEAD WO CONTRAST Result Date: 07/28/2019 CLINICAL DATA:  Follow-up examination for acute stroke. EXAM: MRI HEAD WITHOUT CONTRAST MRA HEAD WITHOUT CONTRAST TECHNIQUE: Multiplanar, multiecho pulse sequences of the brain and surrounding structures were obtained without intravenous contrast. Angiographic images of the head were obtained using MRA technique without contrast. COMPARISON:  Prior studies from 07/26/2019. FINDINGS: MRI HEAD FINDINGS Brain: Cerebral volume within normal limits for age. Mild chronic microvascular ischemic changes present within the periventricular deep white matter both cerebral hemispheres. Restricted diffusion involving the parasagittal anterior left frontal lobe compatible with acute left ACA territory infarct. Associated minimal petechial hemorrhage without hemorrhagic transformation or significant mass effect. Multiple additional scattered subcentimeter acute ischemic infarcts seen involving the cortical and subcortical aspect of both cerebral hemispheres. For reference purposes, the largest of these foci measures 6 mm at the right occipital lobe. No associated hemorrhage or mass effect. Gray-white matter differentiation otherwise maintained. No other areas of remote cortical infarction. No other evidence for acute or chronic intracranial hemorrhage. No mass lesion, midline shift or mass effect. No hydrocephalus. No extra-axial fluid collection. Pituitary gland suprasellar region normal. Midline structures  intact. Vascular: Major intracranial vascular flow voids are maintained. Skull and upper cervical spine: Craniocervical junction within normal limits. Bone marrow signal intensity normal. No scalp soft tissue abnormality. Sinuses/Orbits: Patient status post bilateral ocular lens replacement. Paranasal sinuses are largely clear. No significant mastoid effusion. Inner ear structures grossly normal. Other: None. MRA HEAD FINDINGS ANTERIOR CIRCULATION: Tortuosity of the distal cervical ICAs noted bilaterally. Internal carotid arteries widely patent to the termini without stenosis. Widely patent right A1. Absent left A1, accounting for the diminutive left ICA is compared to the right. Normal anterior communicating artery. There has been interval revascularization of previously seen short-segment left A2 occlusion, with the visualized ACAs now patent to their distal aspects. M1 segments widely patent. Normal MCA bifurcations. Distal MCA branches well perfused and symmetric. POSTERIOR CIRCULATION: Vertebral arteries widely patent to the vertebrobasilar junction without stenosis. Dominant left vertebral artery. Posterior inferior cerebral arteries patent bilaterally. Basilar patent to its distal aspect without stenosis. Superior cerebral arteries patent bilaterally. Both PCAs primarily supplied via the basilar and are well perfused to their distal aspects. No intracranial aneurysm. IMPRESSION: MRI HEAD IMPRESSION: 1. Evolving acute ischemic left ACA  territory infarct. Associated minimal petechial hemorrhage without hemorrhagic transformation or mass effect. 2. Additional scattered small volume subcentimeter cortical and subcortical ischemic infarcts involving both cerebral hemispheres. 3. Underlying mild chronic microvascular ischemic disease. MRA HEAD IMPRESSION: 1. Interval revascularization of previously seen short-segment left A2 occlusion. No large vessel occlusion now evident. 2. Mild atherosclerotic change elsewhere  throughout the intracranial circulation. No other hemodynamically significant or correctable stenosis. Electronically Signed   By: Jeannine Boga M.D.   On: 07/28/2019 05:23         IR PERCUTANEOUS ART THROMBECTOMY/INFUSION INTRACRANIAL INC DIAG ANGIO Result Date: 07/30/2019 INDICATION: Acute onset of right leg weakness and confusion. Occluded left anterior cerebral artery A2 segment and distally on CT angiogram of the head and neck. EXAM: 1. EMERGENT LARGE VESSEL OCCLUSION THROMBOLYSIS anterior CIRCULATION) COMPARISON:  CT angiogram of the head and neck of July 26, 2019. MEDICATIONS: Vancomycin 1 g IV antibiotic was administered within 1 hour of the procedure. ANESTHESIA/SEDATION: General anesthesia. CONTRAST:  Isovue 300 approximately 90 cc. FLUOROSCOPY TIME:  Fluoroscopy Time: 26 minutes 12 seconds (1676 mGy). COMPLICATIONS: None immediate. TECHNIQUE: Following a full explanation of the procedure along with the potential associated complications, an informed witnessed consent was obtained. The risks of intracranial hemorrhage of 10%, worsening neurological deficit, ventilator dependency, death and inability to revascularize were all reviewed in detail with the patient. The patient was then put under general anesthesia by the Department of Anesthesiology at [facility]. The right groin was prepped and draped in the usual sterile fashion. Thereafter using modified Seldinger technique, transfemoral access into the right common femoral artery was obtained without difficulty. Over a 0.035 inch guidewire a 5 French Pinnacle sheath was inserted. Through this, and also over a 0.035 inch guidewire a 5 [place] JB 1 catheter was advanced to the aortic arch region and selectively positioned in the innominate artery, the right common carotid artery, the left common carotid artery and the left subclavian artery. FINDINGS: The left vertebral artery origin is widely patent. The vessel is seen to opacify to  the cranial skull base. Wide patency is seen of the left vertebrobasilar junction and the left posteroinferior cerebral artery. The opacified portion of the basilar artery, the posterior cerebral artery, the superior cerebellar arteries and anterior-inferior cerebellar arteries is widely patent into the capillary and venous phases. Unopacified blood is seen in the basilar artery from the contralateral vertebral artery. The left common carotid arteriogram demonstrates the left external carotid artery and its major branches to be widely patent. The left internal carotid artery at the bulb to the cranial skull base demonstrates wide patency with mild tortuosity at the distal cervical segment. More distally, the petrous, cavernous and supraclinoid segments are widely patent. The left middle cerebral artery is seen to opacify into the capillary and venous phases. No angiographic presence of the left anterior cerebral A1 segment is seen. The innominate arteriogram demonstrates the right subclavian artery and the right common carotid artery origins to be widely patent. The right vertebral artery origin demonstrates mild stenosis at its origin. Otherwise, the vessel is seen to opacify to the cranial skull base. The right common carotid arteriogram reveals mild stenosis of the right external carotid origin. Its branches are seen opacified. The right internal carotid artery at the bulb to the cranial skull base is widely patent with moderate O shaped tortuosity of the junction of the middle and the distal 1/3 segments. More distally, the petrous, cavernous and the supraclinoid segments widely patent. The right  middle cerebral artery and the right anterior cerebral artery opacify into the capillary and venous phases. Prompt cross filling via the anterior communicating artery of the right anterior cerebral artery A2 segment proximally is seen with evidence of a stump consistent with an occlusion. PROCEDURE: The diagnostic JB 1  catheter in the right common carotid artery was then exchanged over a 0.035 inch 300 cm Rosen exchange guidewire for an 8 [place] Pinnacle sheath which was connected to continuous heparinized saline infusion. Over the exchange guidewire, an 087 Walrus balloon guide catheter which had been prepped with 50% contrast and 50% heparinized saline infusion was advanced and positioned in the proximal right internal carotid artery. The guidewire was removed. Good aspiration was obtained from the hub of the balloon guide catheter. A gentle control arteriogram performed through this demonstrated no change in the extracranial or intracranial circulations. Over a 0.014 inch standard Synchro micro guidewire with a J configuration, a combination of 150 cm Trevo ProVue microcatheter inside of a 6 French 132 cm Catalyst guide catheter was advanced without difficulty to the supraclinoid right ICA. The micro guidewire was then gently manipulated with a torque device and advanced into the right anterior cerebral A1 segment followed by the advancement of the micro guidewire without difficulty through the anterior communicating artery into the left anterior cerebral artery distal A2 A3 segment followed by the microcatheter. The guidewire was removed. Good aspiration obtained from the hub of the microcatheter. A gentle injection through the microcatheter demonstrated safe position of tip of the microcatheter which was then connected to continuous heparinized saline infusion. A 33 mm x 33 mm Trevo ProVue retrieval device was then advanced to the distal end of the microcatheter. The O ring on the delivery microcatheter was loosened. With slight forward gentle traction with the right hand on the delivery micro guidewire with the left hand the delivery microcatheter was retrieved unsheathing the distal and then the proximal portion of the retrieval device. The Catalyst guide catheter was advanced to just proximal to the occluded segment of the  left anterior cerebral A2 segment proximally. With proximal flow arrest in the right internal carotid artery by inflating the balloon of the [facility] guide catheter and constant aspiration being applied at the hub of the balloon guide catheter with a 60 mL syringe, and with a Penumbra aspiration device at the hub of the 6 [place] Catalyst guide catheter for approximately 2-1/2 minutes, the combination of the retrieval device, the microcatheter and the 6 [place] Catalyst guide catheter were retrieved and removed. Aspiration at the balloon guide catheter in the right internal carotid artery was continued as proximal flow arrest was reversed. The aspirated contained multiple small bits of dark clots and also in the canister. A control arteriogram performed through the balloon guide catheter in the right internal carotid artery demonstrated complete angiographic revascularization of the left anterior cerebral A1 A2 and A3 segments achieving a TICI 3 revascularization. Mild spasm of the right anterior cerebral A1 segment responded to 25 mcg of nitroglycerin intra-arterially. No evidence of intraluminal filling defects or of occlusions were seen in the right middle cerebral artery, right anterior cerebral artery and the left anterior cerebral artery distribution. Throughout the procedure, the patient's blood pressure and neurological status remained stable. No evidence of mass-effect or mass midline shift or of extravasation was seen. The balloon guide catheter was removed. The 8 French Pinnacle sheath was then left in situ on account of the patient being on IV tPA. Also, the patient was  left intubated because of poor communication prior to the procedure and to protect her airway. A flat panel CT of the brain demonstrated no evidence of mass effect or midline shift or of hemorrhage. The right groin appeared soft. The dorsalis pedis, and the posterior tibial arteries bilaterally demonstrated Dopplerable presence. The patient  was then transferred to the neuro ICU intubated for post thrombectomy management. IMPRESSION: Status post endovascular complete revascularization of the left anterior cerebral A2 segment and distally with 1 pass with the 3 mm x 33 mm Trevo ProVue retrieval device, and Penumbra aspiration achieving a TICI 3 revascularization. PLAN: Follow up in the clinic 4 weeks post discharge. Electronically Signed   By: Luanne Bras M.D.   On: 07/27/2019 12:44        12-lead ECG SR, LBBB All prior EKG's in EPIC reviewed with no documented atrial fibrillation  Telemetry SR  Assessment and Plan:  1. Cryptogenic stroke The patient presents with cryptogenic stroke.  Neurology initially suggested [facility] given her reduced LVEF, though on communication with neurology NP has deferred to cardiology.   The case was discussed with Dr. Virgina Jock.  He reviewed prior echo images and feels her LVEF has been about where it is now by her last echo as well in 2019.  She had been lost to follow up in thier office for about 2 years. She has been on BB, no ACE/ARB,  with plans to start [facility] out patient. We discussed loop monitor implant for AFib surveillance given cryptogenic stroke (embolic). Given her reduced LVEF being not new, uncertain how much recovery of her LVEF she may or may not have once on GDMT.  If she does not have recovery of her LVEF to >35% she would have indication for an ICD implant. With this discussed we feel right 1st step would be to get her on GDMT, plan for event monitoring (he requests we manage this via mailed ZIO patch to the patient).  If she does have recovery of her LVEF to >35% can place loop outpatient.    I had a hard time having a meaningful discussion with the patient today, (seems to me as described by rehab note as well).  She wakes easily, answers my questions, follows directions, is AAO to self, place and situation.  But seems sleepy/somnolent.    Dr. Lovena Le will see later  today  Dr. Virgina Jock asked if our office would arrange monitoring.  I have ordered Zio AT, and message or monitor staff as well to arrange monitoring for the patient ASAP   Baldwin [place], Hershal Coria 07/30/2019  EP attending Patient seen and examined.  Agree with the findings as noted above.  The patient presents with a cryptogenic stroke.  Work-up has been unrevealing except that her ejection fraction which was previously reduced, has reduced further.  We discussed the insertion of implantable loop recorder versus wearing a cardiac monitor for several weeks.  In addition we discussed the importance of optimized guideline directed medical therapy for her heart failure.  In the long-term, she will likely end up with a biventricular ICD unless her ejection fraction improves.  I would anticipate the device be placed in the next 3 to 4 months.  Implantable loop recorder would certainly be a consideration, but would likely be removed at the time of ICD insertion.  My recommendation will be that the patient wear a cardiac monitor for the next several weeks, uptitrate her medications, repeat her 2D echo in approximately 3 months, and if  her ejection fraction remains 35% or less undergo insertion of a biventricular ICD.  If her ejection fraction improves then consideration for an implantable loop recorder would be considered unless she has had evidence of atrial fibrillation while she is wearing her subcutaneous cardiac monitor.  Cristopher Peru, MD

## 2019-07-30 NOTE — Progress Notes (Signed)
Inpatient Rehab Admissions:  Inpatient Rehab Consult received.  I met with patient at the bedside for rehabilitation assessment and to discuss goals and expectations of an inpatient rehab admission.  She is open to CIR and agreeable to begin insurance auth and for me to call her husband to confirm 24/7 supervision.  Left voicemail for spouse.  Will follow for possible admission pending confirmation of dispo, insurance authorization, and bed availability.   Signed: Shann Medal, PT, DPT Admissions Coordinator [phone] 07/30/19  10:39 AM

## 2019-07-30 NOTE — Consult Note (Addendum)
Physical Medicine and Rehabilitation Consult Reason for Consult: Right side weakness Referring Physician: Dr. Leonie Man   HPI: Kylie Tucker is a 69 y.o. right-handed female with history of breast cancer with right mastectomy chemo/radiation, migraine headaches, questionable dementia, hypertension, hyperlipidemia, left bundle branch block.  Per chart review patient lives with spouse.  1 level home 6 steps to entry.  She used a straight point cane prior to admission.  Husband was assisting with some ADLs.  Presented 07/26/2019 with acute onset of right side weakness to [facility].  Cranial CT scan negative.  TPA was initiated.  CTA showed left A2 occlusion.  Patient was transferred ported to [facility] for further evaluation.  Interventional radiology follow-up underwent emergent mechanical thrombectomy of left ACA A2 segment occlusion.  Latest MRI and imaging revealed evolving acute ischemic left ACA territory infarction.  Additional scattered small volume subcentimeter cortical and subcortical ischemic infarcts involving both cerebral hemispheres.  Follow-up MRI of the head after revascularization showing no large vessel occlusion evident.  Echocardiogram with ejection fraction of 20 to 25% no definite KV thrombus.  Cardiology service follow-up for review of echocardiogram and no significant change noted a comparison to 2019.  Await plan for possible loop recorder.  Patient is currently maintained on Plavix for CVA prophylaxis.  Subcutaneous Lovenox for DVT prophylaxis.  Therapy evaluations completed with recommendations of physical medicine rehab consult. Pt somnolent opens eyes to command attend a min then closes eyes again.  Review of Systems  Constitutional: Negative for chills and fever.  HENT: Negative for hearing loss.   Eyes: Negative for blurred vision and double vision.  Respiratory: Negative for cough and shortness of breath.   Cardiovascular: Negative for chest  pain, palpitations and leg swelling.  Gastrointestinal: Positive for constipation. Negative for heartburn, nausea and vomiting.       GERD  Genitourinary: Negative for dysuria, flank pain and hematuria.  Musculoskeletal: Positive for myalgias.  Skin: Negative for rash.  Neurological: Positive for weakness and headaches.  Psychiatric/Behavioral: The patient has insomnia.        Anxiety  All other systems reviewed and are negative.  Past Medical History:  Diagnosis Date  . Allergy   . Anxiety   . Bilateral ovarian cysts    Do yearly ultrasound.  . Cancer ([place]) rt breast   2004/ chemo/ radiation  . Cellulitis and abscess of upper arm and forearm   . Chronic combined systolic and diastolic CHF (congestive heart failure) ([place])   . Dementia ([place])   . Family history of breast cancer   . Family history of rectal cancer   . GERD (gastroesophageal reflux disease)   . H/O Helicobacter infection 2017  . History of right mastectomy 2004   chemo/ radiation  . Hyperlipidemia   . Hypertension   . Insomnia   . Left bundle branch block 2016  . Migraines   . Moderate mitral regurgitation   . NICM (nonischemic cardiomyopathy) ([place])   . Pneumonia   . PONV (postoperative nausea and vomiting)    cannot take zofran  . Postmastectomy lymphedema rt side  . Sentinel node    Past Surgical History:  Procedure Laterality Date  . BREAST EXCISIONAL BIOPSY Left    benign  . CARPAL TUNNEL RELEASE    . CATARACT EXTRACTION Left 07/12/2013  . MANDIBLE SURGERY  1991  . MASTECTOMY Right    malignant  . NASAL SINUS SURGERY  1990/2001  . ORIF FINGER FRACTURE  02/14/2012   Procedure: OPEN REDUCTION INTERNAL FIXATION (ORIF) METACARPAL (FINGER) FRACTURE;  Surgeon: Wynonia Sours, MD;  Location: [place];  Service: Orthopedics;  Laterality: Right;  RIGHT FIFTH   . ORIF METACARPAL FRACTURE  8/13   rt   . ORIF PATELLA Right 11/07/2018   Procedure: OPEN REDUCTION INTERNAL (ORIF) FIXATION PATELLA;   Surgeon: Nicholes Stairs, MD;  Location: [place];  Service: Orthopedics;  Laterality: Right;  . ORIF PATELLA FRACTURE Right 11/07/2018  . PORT-A-CATH REMOVAL     in and now out  . RADIOLOGY WITH ANESTHESIA N/A 07/26/2019   Procedure: RADIOLOGY WITH ANESTHESIA;  Surgeon: Luanne Bras, MD;  Location: [place];  Service: Radiology;  Laterality: N/A;  . REPAIR EXTENSOR TENDON Left 05/30/2013   Procedure: RELEASE TRANSPOSITION EXTENSOR POLLICUS LONGUS LEFT WRIST;  Surgeon: Wynonia Sours, MD;  Location: [facility];  Service: Orthopedics;  Laterality: Left;  . rt mastectomy  2004   lymph nodes-7-axillary node dissection  . SHOULDER ARTHROSCOPY WITH ROTATOR CUFF REPAIR AND SUBACROMIAL DECOMPRESSION Left 11/22/2013   Procedure: LEFT SHOULDER ARTHROSCOPY WITH SUBACROMIAL DECOMPRESSION DISTAL CLAVICLE RESECTION REPAIR  ROTATOR CUFF AS NEEDED ;  Surgeon: Cammie Sickle., MD;  Location: [place];  Service: Orthopedics;  Laterality: Left;  . WRIST FRACTURE SURGERY     Family History  Problem Relation Age of Onset  . Heart disease Father   . Heart disease Mother   . Hypertension Mother   . Rectal cancer Mother        dx in her early 44s  . Colon polyps Mother   . Breast cancer Maternal Aunt        dx in her early 40s  . Heart disease Paternal Uncle   . Heart disease Paternal Grandmother   . Heart disease Paternal Grandfather   . Diabetes Brother   . Breast cancer Maternal Grandmother        dx in her 47s  . Breast cancer Cousin        dx in early 53s  . Breast cancer Cousin 35  . Hepatitis C Son   . Hypertension Son   . Post-traumatic stress disorder Son    Social History:  reports that she has never smoked. She has never used smokeless tobacco. She reports that she does not drink alcohol or use drugs. Allergies:  Allergies  Allergen Reactions  . Clonazepam Shortness Of Breath and Other (See Comments)    Heart had a "weird feeling" and difficulty  breathing  . Hydrocodone-Acetaminophen Nausea And Vomiting    GI Upset (intolerance) Patient stated 4/28 that she is able to take Hydrocodone  . Rosuvastatin Other (See Comments)    myalgia  . Trazodone And Nefazodone Nausea Only and Other (See Comments)    Felt like "I was burning all over" -patient  . Acetaminophen Nausea Only  . Amoxicillin-Pot Clavulanate Nausea Only    flushing Did it involve swelling of the face/tongue/throat, SOB, or low BP? No Did it involve sudden or severe rash/hives, skin peeling, or any reaction on the inside of your mouth or nose? No Did you need to seek medical attention at a hospital or doctor's office? Unknown When did it last happen?5 years If all above answers are "NO", may proceed with cephalosporin use.   . Cefuroxime Axetil Nausea Only  . Cephalexin Nausea Only  . Levofloxacin Nausea Only  . Morphine And Related Other (See Comments)    Hallucinations  .  Ondansetron Nausea And Vomiting  . Propoxyphene N-Acetaminophen Nausea And Vomiting  . Sulfonamide Derivatives Itching  . Vioxx [Rofecoxib] Nausea And Vomiting  . Aspirin Nausea And Vomiting    Gastrointestinal upset  . Azithromycin Palpitations  . Clarithromycin Nausea Only and Anxiety  . Codeine Itching and Rash  . Milk-Related Compounds Nausea And Vomiting  . Seroquel [Quetiapine Fumarate] Palpitations    Heart racing   . Sulfamethoxazole Rash  . Tramadol Nausea And Vomiting   Medications Prior to Admission  Medication Sig Dispense Refill  . albuterol (VENTOLIN HFA) 108 (90 Base) MCG/ACT inhaler Inhale 1-2 puffs into the lungs every 6 (six) hours as needed for wheezing or shortness of breath. 18 g 0  . diazepam (VALIUM) 10 MG tablet Take 10 mg by mouth at bedtime.     Marland Kitchen doxycycline (VIBRAMYCIN) 100 MG capsule Take 100 mg by mouth 2 (two) times daily. For ten days    . metoprolol succinate (TOPROL-XL) 50 MG 24 hr tablet Take 50 mg by mouth at bedtime.     . Multiple Vitamin  (MULTIVITAMIN WITH MINERALS) TABS tablet Take 1 tablet by mouth daily.    . triazolam (HALCION) 0.25 MG tablet Take 0.25 mg by mouth at bedtime.       Home: Home Living Family/patient expects to be discharged to:: Private residence Living Arrangements: Spouse/significant other Available Help at Discharge: Family, Available 24 hours/day Type of Home: House Home Access: Stairs to enter [employer] of Steps: 6 Entrance Stairs-Rails: Left Home Layout: One level Bathroom Shower/Tub: Chiropodist: [place]: Grab bars - toilet, [place] - single point, Crutches  Lives With: Spouse  Functional History: Prior Function Level of Independence: Needs assistance Gait / Transfers Assistance Needed: Mod indep with SPC ADL's / Homemaking Assistance Needed: pt states she is independent, but per chart review, pt husband assisting with IADL's Functional Status:  Mobility: Bed Mobility Overal bed mobility: Needs Assistance Bed Mobility: Supine to Sit Supine to sit: Mod assist General bed mobility comments: pt sitting in recliner upon arrival Transfers Overall transfer level: Needs assistance Equipment used: Rolling walker (2 wheeled), None Transfers: Sit to/from Stand Sit to Stand: Mod assist Stand pivot transfers: Mod assist General transfer comment: ModA to powerup from recliner; stood from recliner x 4 Ambulation/Gait General Gait Details: unable    ADL: ADL Overall ADL's : Needs assistance/impaired Eating/Feeding: Set up, Sitting Grooming: Set up, Sitting Upper Body Bathing: Minimal assistance, Sitting Lower Body Bathing: Moderate assistance, Sit to/from stand Upper Body Dressing : Minimal assistance, Sitting Lower Body Dressing: Moderate assistance, Sit to/from stand Toilet Transfer: Moderate assistance, Stand-pivot Toileting- Clothing Manipulation and Hygiene: Moderate assistance, Sit to/from stand Functional mobility during ADLs: Moderate  assistance, Rolling walker General ADL Comments: pt limited by decreased activity tolerance, generalized weakness, and cognition  Cognition: Cognition Overall Cognitive Status: History of cognitive impairments - at baseline Arousal/Alertness: Awake/alert Orientation Level: Oriented X4 Attention: Focused, Sustained, Selective Focused Attention: Appears intact Sustained Attention: Appears intact Selective Attention: Impaired Selective Attention Impairment: Verbal basic Memory: Impaired Memory Impairment: Retrieval deficit, Decreased recall of new information, Decreased short term memory, Storage deficit Decreased Short Term Memory: Verbal basic Awareness: Impaired Awareness Impairment: Intellectual impairment Problem Solving: Impaired Problem Solving Impairment: Verbal basic Comments: 15/30 on SLUMS Cognition Arousal/Alertness: Awake/alert Behavior During Therapy: WFL for tasks assessed/performed Overall Cognitive Status: History of cognitive impairments - at baseline General Comments: Pt A&Ox4, poor historian, significantly decreased awareness of deficits/safety. Follows 1 step commands consistently. Needs cues  for initiation  Blood pressure 140/68, pulse 79, temperature 99.4 F (37.4 C), temperature source Oral, resp. rate 18, weight 83.1 kg, last menstrual period 01/10/2003, SpO2 92 %. Physical Exam  Nursing note and vitals reviewed. Constitutional: She is oriented to person, place, and time. She appears well-developed and well-nourished.  HENT:  Head: Normocephalic and atraumatic.  Eyes: Pupils are equal, round, and reactive to light. Conjunctivae and EOM are normal.  Cardiovascular: Normal rate, regular rhythm and normal heart sounds.  No murmur heard. Respiratory: Effort normal and breath sounds normal. No respiratory distress. She has no wheezes. She has no rales.  GI: Soft. Bowel sounds are normal. She exhibits no distension. There is no abdominal tenderness.    Neurological: She is alert and oriented to person, place, and time.  Patient is alert sitting up in bed makes eye contact with examiner.  She would initiate conversation providing her name age and date of birth.  Follows simple commands. Motor strength is 4/5 in the left deltoid, bicep, tricep, grip, hip flexor, knee extensor, ankle dorsiflexor 2 - at the right deltoid bicep tricep 3 - finger flexors to minus finger extensors 3 - in the right hip flexor 4 - knee extensor 3 - right ankle dorsiflexor. Sensation intact to light touch in the right upper and right lower limb. Speech without dysarthria or aphasia Patient with poor initiation  Skin: Skin is warm and dry.  Psychiatric: She has a normal mood and affect.    Results for orders placed or performed during the hospital encounter of 07/26/19 (from the past 24 hour(s))  Basic metabolic panel     Status: Abnormal   Collection Time: 07/29/19  7:07 AM  Result Value Ref Range   Sodium 140 135 - 145 mmol/L   Potassium 3.3 (L) 3.5 - 5.1 mmol/L   Chloride 105 98 - 111 mmol/L   CO2 26 22 - 32 mmol/L   Glucose, Bld 112 (H) 70 - 99 mg/dL   BUN 22 8 - 23 mg/dL   Creatinine, Ser 1.28 (H) 0.44 - 1.00 mg/dL   Calcium 8.8 (L) 8.9 - 10.3 mg/dL   GFR calc non Af Amer 43 (L) >60 mL/min   GFR calc Af Amer 50 (L) >60 mL/min   Anion gap 9 5 - 15  CBC     Status: None   Collection Time: 07/29/19  7:07 AM  Result Value Ref Range   WBC 7.7 4.0 - 10.5 K/uL   RBC 4.29 3.87 - 5.11 MIL/uL   Hemoglobin 12.6 12.0 - 15.0 g/dL   HCT 39.7 36.0 - 46.0 %   MCV 92.5 80.0 - 100.0 fL   MCH 29.4 26.0 - 34.0 pg   MCHC 31.7 30.0 - 36.0 g/dL   RDW 15.5 11.5 - 15.5 %   Platelets 225 150 - 400 K/uL   nRBC 0.0 0.0 - 0.2 %  Brain natriuretic peptide     Status: Abnormal   Collection Time: 07/29/19  7:07 AM  Result Value Ref Range   B Natriuretic Peptide 1,409.9 (H) 0.0 - 100.0 pg/mL  Glucose, capillary     Status: Abnormal   Collection Time: 07/29/19 11:49 AM   Result Value Ref Range   Glucose-Capillary 152 (H) 70 - 99 mg/dL   Comment 1 Notify RN    Comment 2 Document in Chart   Glucose, capillary     Status: Abnormal   Collection Time: 07/29/19  4:05 PM  Result Value Ref Range   Glucose-Capillary  169 (H) 70 - 99 mg/dL   Comment 1 Notify RN    Comment 2 Document in Chart   Basic metabolic panel     Status: Abnormal   Collection Time: 07/30/19  2:24 AM  Result Value Ref Range   Sodium 141 135 - 145 mmol/L   Potassium 3.4 (L) 3.5 - 5.1 mmol/L   Chloride 102 98 - 111 mmol/L   CO2 27 22 - 32 mmol/L   Glucose, Bld 111 (H) 70 - 99 mg/dL   BUN 20 8 - 23 mg/dL   Creatinine, Ser 1.29 (H) 0.44 - 1.00 mg/dL   Calcium 8.6 (L) 8.9 - 10.3 mg/dL   GFR calc non Af Amer 43 (L) >60 mL/min   GFR calc Af Amer 49 (L) >60 mL/min   Anion gap 12 5 - 15  CBC     Status: None   Collection Time: 07/30/19  2:24 AM  Result Value Ref Range   WBC 8.9 4.0 - 10.5 K/uL   RBC 4.92 3.87 - 5.11 MIL/uL   Hemoglobin 14.4 12.0 - 15.0 g/dL   HCT 44.3 36.0 - 46.0 %   MCV 90.0 80.0 - 100.0 fL   MCH 29.3 26.0 - 34.0 pg   MCHC 32.5 30.0 - 36.0 g/dL   RDW 15.1 11.5 - 15.5 %   Platelets 263 150 - 400 K/uL   nRBC 0.0 0.0 - 0.2 %   No results found.    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 Does the need for close, 24 hr/day medical supervision in concert with the patient's rehab needs make it unreasonable for this patient to be served in a less intensive setting? Yes 2. Co-Morbidities requiring supervision/potential complications: History of hypertension, hyperlipidemia, questionable dementia history, history of breast carcinoma 3. Due to bladder management, bowel management, safety, skin/wound care, disease management, medication administration, pain management and patient education, does the patient require 24 hr/day rehab nursing? Yes 4. Does the patient require coordinated care of a physician, rehab nurse, therapy  disciplines of PT, OT, speech to address physical and functional deficits in the context of the above medical diagnosis(es)? Yes Addressing deficits in the following areas: balance, endurance, locomotion, strength, transferring, bowel/bladder control, bathing, dressing, toileting, cognition and psychosocial support 5. Can the patient actively participate in an intensive therapy program of at least 3 hrs of therapy per day at least 5 days per week? Patient is somnolent today, if this improves will be able to tolerate 3 hours/day 6. The potential for patient to make measurable gains while on inpatient rehab is good 7. Anticipated functional outcomes upon discharge from inpatient rehab are supervision  with PT, supervision with OT, supervision with SLP. 8. Estimated rehab length of stay to reach the above functional goals is: 14 to 18 days 9. Anticipated discharge destination: Home 10. Overall Rehab/Functional Prognosis: good  RECOMMENDATIONS: This patient's condition is appropriate for continued rehabilitative care in the following setting: CIR Patient has agreed to participate in recommended program. Potentially Note that insurance prior authorization may be required for reimbursement for recommended care.  Comment: The patient is currently too somnolent to participate in an intensive therapy program.  Rehab admission coordinator to follow-up in a.m., if this improves would potentially be ready for rehab at that time  Cathlyn Parsons, PA-C 07/30/2019  "I have personally performed a face to face diagnostic evaluation of this patient.  Additionally, I have reviewed and concur with the physician assistant's documentation above." Mitzi Hansen  Eulogio Ditch M.D. [facility] FAAPM&R (Neuromuscular Med) Diplomate Am Board of Electrodiagnostic Med Fellow Am Board of Interventional Pain

## 2019-07-30 NOTE — Progress Notes (Signed)
STROKE TEAM PROGRESS NOTE   INTERVAL HISTORY Patient is awake and interactive.  She continues to show improvement in right leg strength is better today and she can lift it off the bed.  She was seen by Dr. Virgina Jock cardiology who feels her ejection fraction on the current echo is not much different from the one 2 years ago and recommends cardiac monitoring for A. fib.  Electrophysiology team has been consulted for loop recorder  Vitals:   07/29/19 1943 07/30/19 0000 07/30/19 0404 07/30/19 0902  BP: (!) 161/77 (!) 162/82 140/68 (!) 120/56  Pulse: 79 85 79 63  Resp: 19 19 18  (!) 25  Temp: 100.3 F (37.9 C) 100.1 F (37.8 C) 99.4 F (37.4 C) 99.3 F (37.4 C)  TempSrc: Oral Oral Oral Oral  SpO2: 92% 93% 92% 98%  Weight:        CBC:  Recent Labs  Lab 07/26/19 0524 07/27/19 0651 07/29/19 0707 07/30/19 0224  WBC 7.9   < > 7.7 8.9  NEUTROABS 5.5  --   --   --   HGB 14.8   < > 12.6 14.4  HCT 46.7*   < > 39.7 44.3  MCV 93.0   < > 92.5 90.0  PLT 273   < > 225 263   < > = values in this interval not displayed.    Basic Metabolic Panel:  Recent Labs  Lab 07/29/19 0707 07/30/19 0224  NA 140 141  K 3.3* 3.4*  CL 105 102  CO2 26 27  GLUCOSE 112* 111*  BUN 22 20  CREATININE 1.28* 1.29*  CALCIUM 8.8* 8.6*    IMAGING past 48 hours No results found.   PHYSICAL EXAM     General -frail elderly Caucasian lady, in no apparent distress.  Ophthalmologic - fundi not visualized due to noncooperation.  Cardiovascular - Regular rhythm and rate.  Mental Status -  Level of arousal and orientation to time, place, and person were intact. Language including expression, naming, repetition, comprehension was assessed and found intact. Fund of Knowledge was assessed and was diminished  Cranial Nerves II - XII - II - Visual field intact OU. III, IV, VI - Extraocular movements intact. V - Facial sensation intact bilaterally. VII - Facial movement intact bilaterally. VIII - Hearing  & vestibular intact bilaterally. X - Palate elevates symmetrically. XI - Chin turning & shoulder shrug intact bilaterally. XII - Tongue protrusion intact.  Motor Strength - The patient's strength was normal in all extremities except right leg weakness with right foot DF and PF 3-/5 and right knee flexion 4/5 and mild right lower extremity pronator drift was present.  Bulk was normal and fasciculations were absent.   Motor Tone - Muscle tone was assessed at the neck and appendages and was normal.  Reflexes - The patient's reflexes were symmetrical in all extremities and she had no pathological reflexes.  Sensory - Light touch, temperature/pinprick were assessed and were symmetrical.    Coordination - The patient had normal movements in the hands with no ataxia or dysmetria.  Tremor was absent.  Gait and Station - deferred.   ASSESSMENT/PLAN Ms. JAMARIA CUFFIE is a 69 y.o. female with history of HTN, HLD, migraines, breast cancer, CHF, dementia, GERD presenting to [facility] ED with R sided weakness. treated w/ tPA 1/14 at 0600. Found to have  L A2 occlusion and transferred to [facility]. [facility] for IR.   Stroke:   L ACA infarct with  L A2 occlusion s/p tPA and IR w/ TICIs L A2 reperfusion, infarct embolic secondary to cardiomyopathy with low EF vs possible atrial fibrillation   Code Stroke CT head No acute abnormality.   CTA head & neck L A2 embolism w/ short segment occlusion. Cardiomyopathy and layering pleural effusions. B ICA looping in neck. L A1 aplastic.   CT perfusion 8cc core, 22cc penumbra  Pre IR CT following neuro changes without ICH post tPA   Cerebral angio L ACA A2 occlusion s/p TICI3 revascularization   CT head L frontal lobe ACA infarct   MRI - 07/28/19 - Evolving acute ischemic left ACA territory infarct. Associated minimal petechial hemorrhage without hemorrhagic transformation or mass effect. Additional scattered small volume subcentimeter  cortical and subcortical ischemic infarcts involving both cerebral hemispheres. Underlying mild chronic microvascular ischemic disease.  MRA - 07/28/19 - Interval revascularization of previously seen short-segment left A2 occlusion. No large vessel occlusion now evident. Mild atherosclerotic change elsewhere throughout the intracranial circulation. No other hemodynamically significant or correctable stenosis.  2D Echo EF 25-30%. No LV thrombus   LDL 100  HgbA1c 6.2  Lovenox 40 mg sq daily for VTE prophylaxis  No antithrombotic prior to admission, now on plavix 75mg  daily (ASA allergy).   Consider loop. Low EF stable per cardiology, not unreasonable to place loop to look for AF as source of stroke. Discussed with EP who will further evaluate and discuss w/ cardiology.   Therapy recommendations:  CIR   Disposition:  pending - await insurance approval, bed availability  Postop Acute Respiratory Failure, resolved  Intubated for IR  Left intubated post IR as not fully responsive  Extubated later in day without difficulties  Cardiomyopathy  Acute on chronic combined systolic and diastolic congestive heart failure  NICM w/ EF 25-30%, on home Lasix and metoprolol  03/2018 EF 40 to 45%  Patient stated that she follows with Dr. Einar Gip  Continue metoprolol  Low EF stable per cardiology, not unreasonable to place loop to look for AF as source of stroke.   Follow up with cardiology  Plan enestro as OP  Hypertension  Home meds:  toprol 50  Off cleviprex  Stable   On norvasc 5 and home metoprolol . Long-term BP goal normotensive  Hyperlipidemia  Home meds:  No statin   LDL 100, goal < 70  Intolerance to crestor. Will not do intensive statin d/t hx intolerance  pravastatin 40 changed to zocor 10 1/17 by Dr. Leonie Man  Continue statin at discharge  Other Stroke Risk Factors  Advanced age  Obesity, Body mass index is 33.49 kg/m., recommend weight loss, diet and  exercise as appropriate   Migraines  Other Active Problems  Hx breast cancer w/ R effusion, pretracheal adenopathy - warrants evaluation once stable  AKI Cre 1.18->1.07->1.14->1.28->1.29  Hypokalemia 4.1->3.3->3.4 - supplement - recheck  Hospital day # 4  Continue ongoing therapies and transfer to inpatient rehab in the next few days when bed available.  Electrophysiology team to consider loop recorder.  Discussed this with the EP team and Dr. Virgina Jock. Greater than 50% time during this 25-minute visit was spent on counseling and coordination of care and discussion with care team Antony Contras, MD To contact Stroke Continuity provider, please refer to [email or web address] After hours, contact General Neurology

## 2019-07-30 NOTE — Progress Notes (Signed)
Physical Therapy Treatment Patient Details Name: Kylie Tucker MRN: [id number] DOB: Nov 13, 1950 Today's Date: 07/30/2019    History of Present Illness Pt is a 69 y.o. female who initially presented to the ED after husband noted her to be weak on her right side. MRI showing evolving acute ischemic left ACA territory infarct. Pt s/p emergent mechanical thrombectomy of left ACA A2 segment occlusion achieving a TICI 3 revascularization 07/26/2019 by Dr. Estanislado Pandy. PMH: right breast cancer, CHF, dementia, GERD, HLD, HTN, migraines, PNA     PT Comments    Pt performed gt training and functional mobility.  She remains limited by strength and coordination deficits.  LOB noted during turns with noticeable scissoring.  Pt is very slow to process and presents with flat demeanor.  She remains appropriate for CIR placement to progress mobility before d/c home.     Follow Up Recommendations  CIR;Supervision/Assistance - 24 hour     Equipment Recommendations  Rolling walker with 5" wheels;Wheelchair (measurements PT);Wheelchair cushion (measurements PT);3in1 (PT)    Recommendations for Other Services       Precautions / Restrictions Precautions Precautions: Fall Restrictions Weight Bearing Restrictions: No    Mobility  Bed Mobility Overal bed mobility: Needs Assistance Bed Mobility: Supine to Sit;Sit to Supine     Supine to sit: Mod assist Sit to supine: Mod assist   General bed mobility comments: Pt slow to process required assistance to advance LEs to edge of bed and elevate trunk into a seated position.  Transfers Overall transfer level: Needs assistance Equipment used: Rolling walker (2 wheeled) Transfers: Sit to/from Stand Sit to Stand: Mod assist         General transfer comment: Cues for hand placement to and from seated surface.  Ambulation/Gait Ambulation/Gait assistance: Mod assist Gait Distance (Feet): 40 Feet Assistive device: Rolling walker (2 wheeled) Gait  Pattern/deviations: Step-to pattern;Trunk flexed;Narrow base of support;Antalgic;Scissoring     General Gait Details: Pt required cues for sequencing and safety with RW position.  Noted with scissoring turning R.  Pt required cues for safety throughout.   Stairs             Wheelchair Mobility    Modified Rankin (Stroke Patients Only)       Balance                                            Cognition Arousal/Alertness: Awake/alert Behavior During Therapy: Flat affect Overall Cognitive Status: History of cognitive impairments - at baseline                                 General Comments: Very slow processing.  Needs step by step cueing.      Exercises      General Comments        Pertinent Vitals/Pain Pain Assessment: Faces Faces Pain Scale: Hurts even more Pain Location: low back Pain Descriptors / Indicators: Aching Pain Intervention(s): Monitored during session;Repositioned    Home Living                      Prior Function            PT Goals (current goals can now be found in the care plan section) Acute Rehab PT Goals Patient Stated Goal: pt did not state Potential  to Achieve Goals: Good Progress towards PT goals: Progressing toward goals    Frequency    Min 4X/week      PT Plan Current plan remains appropriate    Co-evaluation              AM-PAC PT "6 Clicks" Mobility   Outcome Measure  Help needed turning from your back to your side while in a flat bed without using bedrails?: A Little Help needed moving from lying on your back to sitting on the side of a flat bed without using bedrails?: A Lot Help needed moving to and from a bed to a chair (including a wheelchair)?: A Lot Help needed standing up from a chair using your arms (e.g., wheelchair or bedside chair)?: A Lot Help needed to walk in hospital room?: A Lot Help needed climbing 3-5 steps with a railing? : Total 6 Click Score:  12    End of Session Equipment Utilized During Treatment: Gait belt Activity Tolerance: Patient tolerated treatment well Patient left: in chair;with call bell/phone within reach;with chair alarm set Nurse Communication: Mobility status PT Visit Diagnosis: Unsteadiness on feet (R26.81);Other abnormalities of gait and mobility (R26.89);Difficulty in walking, not elsewhere classified (R26.2);Other symptoms and signs involving the nervous system (R29.898)     Time: [id number] PT Time Calculation (min) (ACUTE ONLY): 23 min  Charges:  $Gait Training: 8-22 mins $Therapeutic Activity: 8-22 mins                     Erasmo Leventhal , PTA Acute Rehabilitation Services Pager [phone] Office [phone]     Cyd Hostler Eli Hose 07/30/2019, 5:44 PM

## 2019-07-31 ENCOUNTER — Encounter (HOSPITAL_COMMUNITY): Payer: Self-pay

## 2019-07-31 LAB — BASIC METABOLIC PANEL
Anion gap: 10 (ref 5–15)
BUN: 14 mg/dL (ref 8–23)
CO2: 27 mmol/L (ref 22–32)
Calcium: 8.4 mg/dL — ABNORMAL LOW (ref 8.9–10.3)
Chloride: 103 mmol/L (ref 98–111)
Creatinine, Ser: 1.11 mg/dL — ABNORMAL HIGH (ref 0.44–1.00)
GFR calc Af Amer: 59 mL/min — ABNORMAL LOW (ref >60)
GFR calc non Af Amer: 51 mL/min — ABNORMAL LOW (ref >60)
Glucose, Bld: 98 mg/dL (ref 70–99)
Potassium: 3.6 mmol/L (ref 3.5–5.1)
Sodium: 140 mmol/L (ref 135–145)

## 2019-07-31 LAB — CBC
HCT: 41.3 % (ref 36.0–46.0)
Hemoglobin: 13.7 g/dL (ref 12.0–15.0)
MCH: 29.5 pg (ref 26.0–34.0)
MCHC: 33.2 g/dL (ref 30.0–36.0)
MCV: 88.8 fL (ref 80.0–100.0)
Platelets: 234 10*3/uL (ref 150–400)
RBC: 4.65 MIL/uL (ref 3.87–5.11)
RDW: 14.9 % (ref 11.5–15.5)
WBC: 7.2 10*3/uL (ref 4.0–10.5)
nRBC: 0 % (ref 0.0–0.2)

## 2019-07-31 LAB — SARS CORONAVIRUS 2 (TAT 6-24 HRS): SARS Coronavirus 2: NEGATIVE

## 2019-07-31 MED ORDER — GUAIFENESIN-DM 100-10 MG/5ML PO SYRP
5.0000 mL | ORAL_SOLUTION | ORAL | Status: DC | PRN
  Administered 2019-07-31 – 2019-08-01 (×3): 5 mL via ORAL
  Filled 2019-07-31 (×3): qty 5

## 2019-07-31 NOTE — Progress Notes (Signed)
Physical Therapy Treatment Patient Details Name: Kylie Tucker MRN: [id number] DOB: [birth year] Today's Date: 07/31/2019    History of Present Illness Pt is a 69 y.o. female who initially presented to the ED after husband noted her to be weak on her right side. MRI showing evolving acute ischemic left ACA territory infarct. Pt s/p emergent mechanical thrombectomy of left ACA A2 segment occlusion achieving a TICI 3 revascularization 07/26/2019 by Dr. Estanislado Pandy. PMH: right breast cancer, CHF, dementia, GERD, HLD, HTN, migraines, PNA     PT Comments    Pt with much improved mobility today compared to yesterday. Min A for bed mobility, transfers, and ambulation 160' with RW. Continues to be high fall risk due to slow processing and decreased awareness of deficits. Pt's husband preferring SNF over CIR. PT will continue to follow.    Follow Up Recommendations  Supervision/Assistance - 24 hour;SNF     Equipment Recommendations  Rolling walker with 5" wheels;Wheelchair (measurements PT);Wheelchair cushion (measurements PT);3in1 (PT)    Recommendations for Other Services     Precautions / Restrictions Precautions Precautions: Fall Restrictions Weight Bearing Restrictions: No    Mobility  Bed Mobility Overal bed mobility: Needs Assistance Bed Mobility: Supine to Sit     Supine to sit: Min assist     General bed mobility comments: min A for transition between SL LE's off bed and erect sitting. vc's to scoot EOB  Transfers Overall transfer level: Needs assistance Equipment used: Rolling walker (2 wheeled) Transfers: Sit to/from Stand Sit to Stand: Min assist         General transfer comment: cues for getting both feet on floor before starting to stand, min A for power up  Ambulation/Gait Ambulation/Gait assistance: Min assist;+2 safety/equipment Gait Distance (Feet): 160 Feet Assistive device: Rolling walker (2 wheeled) Gait Pattern/deviations: Step-to pattern;Trunk  flexed;Narrow base of support;Antalgic;Decreased step length - right Gait velocity: decreased Gait velocity interpretation: <1.8 ft/sec, indicate of risk for recurrent falls General Gait Details: cues for fwd gaze, pt tends to look down at floor and R step length becomes very short when she does this, step to pattern. With fwd gaze, she ambulates with alternating pattern. Has some difficulty with turns, needed verbal and tactile cues.    Stairs             Wheelchair Mobility    Modified Rankin (Stroke Patients Only) Modified Rankin (Stroke Patients Only) Pre-Morbid Rankin Score: Moderate disability Modified Rankin: Severe disability     Balance Overall balance assessment: Needs assistance Sitting-balance support: Feet supported Sitting balance-Leahy Scale: Poor Sitting balance - Comments: 2 posterior LOB, needed min A to correct first time but second time corrected with vc's Postural control: Posterior lean Standing balance support: Bilateral upper extremity supported Standing balance-Leahy Scale: Poor Standing balance comment: reliant on BUE support on RW                            Cognition Arousal/Alertness: Awake/alert Behavior During Therapy: Flat affect Overall Cognitive Status: History of cognitive impairments - at baseline                                 General Comments: Very slow processing.  Needs step by step cueing. pt was sent soy milk on her tray, she noticed it aware she is unable to have it due to hx of breast cancer  Exercises      General Comments General comments (skin integrity, edema, etc.): pt's husband present during session, pt ambulated in the hallway requiring cues to keep head up and visually attend to where she is going, demonstrated preference for eyes on the ground       Pertinent Vitals/Pain Pain Assessment: No/denies pain Faces Pain Scale: No hurt Pain Intervention(s): Monitored during session     Home Living                      Prior Function            PT Goals (current goals can now be found in the care plan section) Acute Rehab PT Goals Patient Stated Goal: pt did not state PT Goal Formulation: With patient Time For Goal Achievement: 08/11/19 Potential to Achieve Goals: Good Progress towards PT goals: Progressing toward goals    Frequency    Min 4X/week      PT Plan Current plan remains appropriate    Co-evaluation   Reason for Co-Treatment: Complexity of the patient's impairments (multi-system involvement);For patient/therapist safety;To address functional/ADL transfers   OT goals addressed during session: ADL's and self-care      AM-PAC PT "6 Clicks" Mobility   Outcome Measure  Help needed turning from your back to your side while in a flat bed without using bedrails?: A Little Help needed moving from lying on your back to sitting on the side of a flat bed without using bedrails?: A Little Help needed moving to and from a bed to a chair (including a wheelchair)?: A Little Help needed standing up from a chair using your arms (e.g., wheelchair or bedside chair)?: A Little Help needed to walk in hospital room?: A Little Help needed climbing 3-5 steps with a railing? : A Lot 6 Click Score: 17    End of Session Equipment Utilized During Treatment: Gait belt Activity Tolerance: Patient tolerated treatment well Patient left: in chair;with call bell/phone within reach;with chair alarm set;with family/visitor present Nurse Communication: Mobility status PT Visit Diagnosis: Unsteadiness on feet (R26.81);Other abnormalities of gait and mobility (R26.89);Difficulty in walking, not elsewhere classified (R26.2);Other symptoms and signs involving the nervous system (R29.898)     Time: [id number] PT Time Calculation (min) (ACUTE ONLY): 43 min  Charges:  $Gait Training: 8-22 mins $Therapeutic Activity: 8-22 mins                     Leighton Roach,  [place]  Pager [phone] Office [place] 07/31/2019, 4:16 PM

## 2019-07-31 NOTE — NC FL2 (Addendum)
[place] MEDICAID FL2 LEVEL OF CARE SCREENING TOOL     IDENTIFICATION  Patient Name: Kylie Tucker Birthdate: 08/04/1950 Sex: female Admission Date (Current Location): 07/26/2019  [facility] and [place] Number:  Herbalist and Address:  [facility] [place], [place] [address], [place], [place] [address]      Provider Number: [id number]  Attending Physician Name and Address:  Garvin Fila, MD  Relative Name and Phone Number:       Current Level of Care: Hospital Recommended Level of Care: [place] Prior Approval Number:    Date Approved/Denied:   PASRR Number:    Discharge Plan: SNF    Current Diagnoses: Patient Active Problem List   Diagnosis Date Noted   Stroke (cerebrum) ([place]) 07/26/2019   Anterior cerebral circulation infarction, left ([place]) 07/26/2019   Displaced transverse fracture of right patella, initial encounter for closed fracture 11/07/2018   Right patella fracture 11/07/2018   Malignant neoplasm of lower-outer quadrant of right breast of female, estrogen receptor positive ([place]) 05/09/2018   Elevated troponin 04/06/2018   Hallucinations 04/06/2018   Agitation 04/06/2018   HTN (hypertension) 04/06/2018   Bilateral ovarian cysts 05/20/2017   Hyponatremia 11/28/2016   N&V (nausea and vomiting) 11/28/2016   Abdominal pain 11/28/2016   Sleep talking 07/22/2016   Genetic testing 06/01/2016   Family history of breast cancer    Family history of rectal cancer    Chest pain 09/23/2015   Abnormal vision 06/13/2015   Anxiety disorder 06/13/2015   Cataract cortical, senile 06/13/2015   Clinical depression 06/13/2015   Cephalalgia 06/13/2015   H/O malignant neoplasm of breast 06/13/2015   Ocular migraine 06/13/2015   Sinus infection 06/13/2015   Long term current use of systemic steroids [id number]   Chronic systolic CHF (congestive heart failure), NYHA class 1 ([place]) 05/09/2015   LBBB (left bundle branch block)  03/26/2015   Lymphedema of arm 12/13/2013   Complete rupture of rotator cuff 11/22/2013   Nipple discharge in female 12/01/2012   History of breast cancer 11/30/2012   Epiphora 11/28/2012   Disorder of endocrine system 04/25/2012   Ceratitis 04/25/2012   Blepharitis 04/25/2012   Breast cancer, right breast ([place])    FATIGUE 10/01/2009   CAROTID BRUIT 10/01/2009   Dyspnea 10/01/2009   HYPERLIPIDEMIA 09/30/2009    Orientation RESPIRATION BLADDER Height & Weight     Self, Time, Situation, Place  Normal External catheter Weight: 183 lb 2 oz (83.1 kg) Height:     BEHAVIORAL SYMPTOMS/MOOD NEUROLOGICAL BOWEL NUTRITION STATUS      Continent Diet(See DC summary)  AMBULATORY STATUS COMMUNICATION OF NEEDS Skin   Limited Assist Verbally Normal                       Personal Care Assistance Level of Assistance  Bathing, Feeding, Dressing Bathing Assistance: Maximum assistance Feeding assistance: Independent Dressing Assistance: Maximum assistance     Functional Limitations Info  Sight, Hearing, Speech Sight Info: Adequate Hearing Info: Adequate Speech Info: Adequate    SPECIAL CARE FACTORS FREQUENCY  PT (By licensed PT), OT (By licensed OT)     PT Frequency: 5x week OT Frequency: 5x week            Contractures Contractures Info: Not present    Additional Factors Info  Code Status, Allergies, Psychotropic Code Status Info: Full Allergies Info: Clonazepam, Hydrocodone-acetaminophen, Rosuvastatin, Trazodone And Nefazodone, Acetaminophen, Amoxicillin-pot Clavulanate, Cefuroxime Axetil, Cephalexin, Levofloxacin, Morphine And  Related, Ondansetron, Propoxyphene N-acetaminophen, Sulfonamide Derivatives, Vioxx Rofecoxib, Aspirin, Azithromycin, Clarithromycin, Codeine, Milk-related Compounds, Seroquel Quetiapine Fumarate, Sulfamethoxazole, Tramadol Psychotropic Info: Alprazolam (xanax) 3mg          Current Medications (07/31/2019):  This is the current hospital active  medication list Current Facility-Administered Medications  Medication Dose Route Frequency Provider Last Rate Last Admin   albuterol (PROVENTIL) (2.5 MG/3ML) 0.083% nebulizer solution 2.5 mg  2.5 mg Nebulization Q4H PRN Davonna Belling, MD       ALPRAZolam Duanne Moron) tablet 3 mg  3 mg Oral QHS Rosalin Hawking, MD   3 mg at 07/30/19 2125   amLODipine (NORVASC) tablet 5 mg  5 mg Oral Daily Candee Furbish, MD   5 mg at 07/31/19 [id number]   Chlorhexidine Gluconate Cloth 2 % PADS 6 each  6 each Topical Daily Kerney Elbe, MD   6 each at 07/31/19 0927   clopidogrel (PLAVIX) tablet 75 mg  75 mg Oral Daily Rosalin Hawking, MD   75 mg at 07/31/19 0927   enoxaparin (LOVENOX) injection 40 mg  40 mg Subcutaneous Q24H Rosalin Hawking, MD   40 mg at 07/30/19 1405   furosemide (LASIX) tablet 20 mg  20 mg Oral Daily Rosalin Hawking, MD   20 mg at 07/31/19 0927   guaiFENesin-dextromethorphan (ROBITUSSIN DM) 100-10 MG/5ML syrup 5 mL  5 mL Oral Q4H PRN Donzetta Starch, NP       metoprolol succinate (TOPROL-XL) 24 hr tablet 50 mg  50 mg Oral QHS Kerney Elbe, MD   50 mg at 07/30/19 2126   nitroGLYCERIN 1 mg/10 mL (100 mcg/mL) - IR/CATH LAB    PRN Luanne Bras, MD   25 mcg at 07/26/19 1040   pantoprazole (PROTONIX) EC tablet 40 mg  40 mg Oral Daily Rosalin Hawking, MD   40 mg at 07/31/19 [id number]   senna-docusate (Senokot-S) tablet 1 tablet  1 tablet Oral QHS PRN Kerney Elbe, MD   1 tablet at 07/29/19 2117   simvastatin (ZOCOR) tablet 20 mg  20 mg Oral q1800 Garvin Fila, MD         Discharge Medications: Please see discharge summary for a list of discharge medications.  Relevant Imaging Results:  Relevant Lab Results:   Additional Information SS # [id number]  Kirstie Peri, Student-Social Work   I have personally obtained history,examined this patient, reviewed notes, independently viewed imaging studies, participated in medical decision making and plan of care.ROS completed by me personally and pertinent positives  fully documented  I have made any additions or clarifications directly to the above note. Agree with note above.   Antony Contras, MD Medical Director [facility] Pager: [phone] 07/31/2019 10:49 AM

## 2019-07-31 NOTE — Progress Notes (Signed)
Inpatient Rehab Admissions Coordinator:   I was able to reach pt's spouse [phone]) and he prefers pt transition to SNF for rehab, preferably closer to home.  Will let Rexene Alberts, CSW know and sign off for CIR.   Shann Medal, PT, DPT Admissions Coordinator [phone] 07/31/19  10:00 AM

## 2019-07-31 NOTE — TOC Progression Note (Addendum)
Transition of Care [facility]) - Progression Note    Patient Details  Name: Kylie Tucker MRN: [id number] Date of Birth: 12-17-1950  Transition of Care [facility]) CM/SW [place], [place] Work Phone Number: 07/31/2019, 1:38 PM  Clinical Narrative:     MSW Intern went to speak with PT to update her about discharge plans. She was told that [facility] had accepted her and that she would need another Covid test before going over, hopefully tomorrow. She stated that she understood and had no questions. SW will continue to follow.       Adended 3:00 PM MSW Intern went to speak with PT, Husband at bedside. MSW confirmed that [facility] had accepted and the insurance was improved, and that she would likely go in the morning. Questions about transportation were answered. SW will continue to follow.  Expected Discharge Plan and Services                                                 Social Determinants of Health (SDOH) Interventions    Readmission Risk Interventions No flowsheet data found.

## 2019-07-31 NOTE — Progress Notes (Signed)
STROKE TEAM PROGRESS NOTE   INTERVAL HISTORY Patient is sitting up in bed.  She states her right-sided weakness is improving.  She she prefers not to go to inpatient rehab but prefers skilled nursing home for rehab  Vitals:   07/30/19 2013 07/30/19 2320 07/31/19 0358 07/31/19 0828  BP: (!) 153/78 (!) 143/76 (!) 136/59 (!) 145/72  Pulse: 79 82 70 79  Resp: 17 17 18 18   Temp: 98.9 F (37.2 C) 98.4 F (36.9 C) 98 F (36.7 C) 98.5 F (36.9 C)  TempSrc: Oral Oral Oral Oral  SpO2: 91% 93% 90% 93%  Weight:        CBC:  Recent Labs  Lab 07/26/19 0524 07/27/19 0651 07/30/19 0224 07/31/19 0319  WBC 7.9   < > 8.9 7.2  NEUTROABS 5.5  --   --   --   HGB 14.8   < > 14.4 13.7  HCT 46.7*   < > 44.3 41.3  MCV 93.0   < > 90.0 88.8  PLT 273   < > 263 234   < > = values in this interval not displayed.    Basic Metabolic Panel:  Recent Labs  Lab 07/30/19 0224 07/31/19 0319  NA 141 140  K 3.4* 3.6  CL 102 103  CO2 27 27  GLUCOSE 111* 98  BUN 20 14  CREATININE 1.29* 1.11*  CALCIUM 8.6* 8.4*    IMAGING past 48 hours No results found.   PHYSICAL EXAM      General -frail elderly Caucasian lady, in no apparent distress.  Ophthalmologic - fundi not visualized due to noncooperation.  Cardiovascular - Regular rhythm and rate.  Mental Status -  Level of arousal and orientation to time, place, and person were intact. Language including expression, naming, repetition, comprehension was assessed and found intact. Fund of Knowledge was assessed and was diminished  Cranial Nerves II - XII - II - Visual field intact OU. III, IV, VI - Extraocular movements intact. V - Facial sensation intact bilaterally. VII - Facial movement intact bilaterally. VIII - Hearing & vestibular intact bilaterally. X - Palate elevates symmetrically. XI - Chin turning & shoulder shrug intact bilaterally. XII - Tongue protrusion intact.  Motor Strength - The patient's strength was normal in all  extremities except right leg weakness with right foot DF and PF 4/5 and right knee flexion 4/5 and mild right lower extremity pronator drift was present.  Bulk was normal and fasciculations were absent.   Motor Tone - Muscle tone was assessed at the neck and appendages and was normal.  Reflexes - The patient's reflexes were symmetrical in all extremities and she had no pathological reflexes.  Sensory - Light touch, temperature/pinprick were assessed and were symmetrical.    Coordination - The patient had normal movements in the hands with no ataxia or dysmetria.  Tremor was absent.  Gait and Station - deferred.   ASSESSMENT/PLAN Kylie Tucker is a 68 y.o. female with history of HTN, HLD, migraines, breast cancer, CHF, dementia, GERD presenting to [facility] ED with R sided weakness. treated w/ tPA 1/14 at 0600. Found to have  L A2 occlusion and transferred to [facility]. [facility] for IR.   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   CTA head &  neck L A2 embolism w/ short segment occlusion. Cardiomyopathy and layering pleural effusions. B ICA looping in neck. L A1 aplastic.   CT perfusion 8cc core, 22cc penumbra  Pre IR CT following neuro changes without ICH post tPA   Cerebral angio L ACA A2 occlusion s/p TICI3 revascularization   CT head L frontal lobe ACA infarct   MRI - 07/28/19 - Evolving acute ischemic left ACA territory infarct. Associated minimal petechial hemorrhage without hemorrhagic transformation or mass effect. Additional scattered small volume subcentimeter cortical and subcortical ischemic infarcts involving both cerebral hemispheres. Underlying mild chronic microvascular ischemic disease.  MRA - 07/28/19 - Interval revascularization of previously seen short-segment left A2  occlusion. No large vessel occlusion now evident. Mild atherosclerotic change elsewhere throughout the intracranial circulation. No other hemodynamically significant or correctable stenosis.  2D Echo EF 25-30%. No LV thrombus   LDL 100  HgbA1c 6.2  Lovenox 40 mg sq daily for VTE prophylaxis  No antithrombotic prior to admission, now on plavix 75mg  daily (ASA allergy).   Considered loop. Given reducing low EF, EP feels likely pt will need biventricular ICD unless EF improves. Cardiology recommends wearing a cardiac monitor for the next several weeks, adjust her meds, repeat 2D in 2 mos - if EF remains 35% of less, get ICD. If EF improves, can consider loop at that time.   Therapy recommendations:  CIR -> pt prefers SNF closer to home  Disposition:  pending - bed available at [facility] tomorrow  Covid test for d/c ordered  Postop Acute Respiratory Failure, resolved  Intubated for IR  Left intubated post IR as not fully responsive  Extubated later in day without difficulties - Cardiomyopathy  Acute on chronic combined systolic and diastolic congestive heart failure  NICM w/ EF 25-30%, on home Lasix and metoprolol  03/2018 EF 40 to 45%  Patient stated that she follows with Dr. Einar Gip  Continue metoprolol  Low EF stable per cardiology  Considered loop. Given reducing low EF, EP feels likely pt will need biventricular ICD unless EF improves. Cardiology recommends wearing a cardiac monitor for the next several weeks, adjust her meds, repeat 2D in 2 mos - if EF remains 35% of less, get ICD. If EF improves, can consider loop at that time.   Follow up with cardiology who plan enestro as OP  Hypertension  Home meds:  toprol 50  Off cleviprex  Stable   On norvasc 5 and home metoprolol . Long-term BP goal normotensive  Hyperlipidemia  Home meds:  No statin   LDL 100, goal < 70  Intolerance to crestor. Will not do intensive statin d/t hx intolerance  pravastatin 40 changed  to zocor 10 1/17 by Dr. Leonie Man  Continue statin at discharge  Other Stroke Risk Factors  Advanced age  Obesity, Body mass index is 33.49 kg/m., recommend weight loss, diet and exercise as appropriate   Migraines  Other Active Problems  Hx breast cancer w/ R effusion, pretracheal adenopathy - warrants evaluation once stable  AKI Cre 1.18->1.07->1.14->1.28->1.29->1.11  Hypokalemia 4.1->3.3->3.4 - supplement - 3.6  Hospital day # 5  Continue ongoing physical occupational therapy.  Transfer to skilled nursing facility for rehab in the next 1 to 2 days when bed available.  Discussed with social worker Antony Contras, MD To contact Stroke Continuity provider, please refer to [email or web address] After hours, contact General Neurology

## 2019-07-31 NOTE — Progress Notes (Signed)
Occupational Therapy Treatment Patient Details Name: Kylie Tucker MRN: [id number] DOB: 08-02-1950 Today's Date: 07/31/2019    History of present illness Pt is a 69 y.o. female who initially presented to the ED after husband noted her to be weak on her right side. MRI showing evolving acute ischemic left ACA territory infarct. Pt s/p emergent mechanical thrombectomy of left ACA A2 segment occlusion achieving a TICI 3 revascularization 07/26/2019 by Dr. Estanislado Pandy. PMH: right breast cancer, CHF, dementia, GERD, HLD, HTN, migraines, PNA    OT comments  Pt progressing toward established goals. Upon arrival, pt was sitting EOB with PT and had just finished getting cleaned up as pt had been soiled. Pt currently requires frequent cues to keep head in midline during mobility, she demonstrates preference to visually attend to the floor. During mobility, pt able to navigate around obstacles after cue to look up. She requires setupA for feeding. Pt will continue to benefit from skilled OT services to maximize safety and independence with ADL/IADL and functional mobility. Will continue to follow acutely and progress as tolerated.     Follow Up Recommendations  SNF;Supervision/Assistance - 24 hour    Equipment Recommendations  3 in 1 bedside commode    Recommendations for Other Services      Precautions / Restrictions Precautions Precautions: Fall Restrictions Weight Bearing Restrictions: No       Mobility Bed Mobility               General bed mobility comments: pt sitting EOB upon arrival;cues to scoot hips   Transfers Overall transfer level: Needs assistance Equipment used: Rolling walker (2 wheeled) Transfers: Sit to/from Stand Sit to Stand: Mod assist         General transfer comment: cues for hand placement, modA to powerup into standing    Balance Overall balance assessment: Needs assistance Sitting-balance support: Feet supported Sitting balance-Leahy Scale:  Poor Sitting balance - Comments: frequent loss of posterior balance Postural control: Posterior lean Standing balance support: Bilateral upper extremity supported Standing balance-Leahy Scale: Poor Standing balance comment: reliant on BUE support on RW                           ADL either performed or assessed with clinical judgement   ADL Overall ADL's : Needs assistance/impaired Eating/Feeding: Set up;Sitting Eating/Feeding Details (indicate cue type and reason): assistance with cutting chicken,                      Toilet Transfer: Minimal assistance;Moderate assistance;Ambulation;RW Toilet Transfer Details (indicate cue type and reason): modA to powerup, minA for stability with ambulation, vc for head in midline to see where she is going, able to navigate around obstacles Toileting- Clothing Manipulation and Hygiene: Moderate assistance;Sit to/from stand Toileting - Clothing Manipulation Details (indicate cue type and reason): modA to powerup;     Functional mobility during ADLs: Minimal assistance;Moderate assistance       Vision   Vision Assessment?: No apparent visual deficits   Perception     Praxis      Cognition Arousal/Alertness: Awake/alert Behavior During Therapy: Flat affect Overall Cognitive Status: History of cognitive impairments - at baseline                                 General Comments: Very slow processing.  Needs step by step cueing. pt was sent soy milk  on her tray, she noticed it aware she is unable to have it due to hx of breast cancer        Exercises     Shoulder Instructions       General Comments pt's husband present during session, pt ambulated in the hallway requiring cues to keep head up and visually attend to where she is going, demonstrated preference for eyes on the ground     Pertinent Vitals/ Pain       Pain Assessment: Faces Faces Pain Scale: No hurt Pain Intervention(s): Monitored during  session  Home Living                                          Prior Functioning/Environment              Frequency  Min 2X/week        Progress Toward Goals  OT Goals(current goals can now be found in the care plan section)  Progress towards OT goals: Progressing toward goals  Acute Rehab OT Goals Patient Stated Goal: pt did not state OT Goal Formulation: With patient Time For Goal Achievement: 08/11/19 Potential to Achieve Goals: Good ADL Goals Pt Will Perform Grooming: with set-up;sitting Pt Will Perform Upper Body Dressing: with set-up;sitting Pt Will Perform Lower Body Dressing: with min assist;sit to/from stand Pt Will Transfer to Toilet: with min assist;stand pivot transfer Additional ADL Goal #1: Pt will progress to EOB with minguard in preparation for ADL.  Plan Discharge plan needs to be updated(husband prefers pt d/c to SNF)    Co-evaluation    PT/OT/SLP Co-Evaluation/Treatment: Yes Reason for Co-Treatment: Complexity of the patient's impairments (multi-system involvement);For patient/therapist safety;To address functional/ADL transfers   OT goals addressed during session: ADL's and self-care      AM-PAC OT "6 Clicks" Daily Activity     Outcome Measure   Help from another person eating meals?: A Little Help from another person taking care of personal grooming?: A Little Help from another person toileting, which includes using toliet, bedpan, or urinal?: A Lot Help from another person bathing (including washing, rinsing, drying)?: A Lot Help from another person to put on and taking off regular upper body clothing?: A Little Help from another person to put on and taking off regular lower body clothing?: A Lot 6 Click Score: 15    End of Session Equipment Utilized During Treatment: Gait belt;Rolling walker  OT Visit Diagnosis: Unsteadiness on feet (R26.81);Other abnormalities of gait and mobility (R26.89);Muscle weakness  (generalized) (M62.81);History of falling (Z91.81);Other symptoms and signs involving cognitive function   Activity Tolerance Patient tolerated treatment well   Patient Left in chair;with call bell/phone within reach;with chair alarm set   Nurse Communication Mobility status        Time: 1427-1450 OT Time Calculation (min): 23 min  Charges: OT General Charges $OT Visit: 1 Visit OT Treatments $Self Care/Home Management : 23-37 mins  Dorinda Hill OTR/L Acute Rehabilitation Services Office: [place] 07/31/2019, 3:31 PM

## 2019-08-01 ENCOUNTER — Inpatient Hospital Stay
Admission: RE | Admit: 2019-08-01 | Discharge: 2019-08-22 | Disposition: A | Discharge status: Home / Self Care | Payer: PPO | Source: Ambulatory Visit | Attending: Internal Medicine | Admitting: Internal Medicine

## 2019-08-01 DIAGNOSIS — F411 Generalized anxiety disorder: Secondary | ICD-10-CM | POA: Diagnosis not present

## 2019-08-01 DIAGNOSIS — I63529 Cerebral infarction due to unspecified occlusion or stenosis of unspecified anterior cerebral artery: Secondary | ICD-10-CM | POA: Diagnosis not present

## 2019-08-01 DIAGNOSIS — R600 Localized edema: Secondary | ICD-10-CM | POA: Diagnosis not present

## 2019-08-01 DIAGNOSIS — M5030 Other cervical disc degeneration, unspecified cervical region: Secondary | ICD-10-CM | POA: Diagnosis not present

## 2019-08-01 DIAGNOSIS — R5381 Other malaise: Secondary | ICD-10-CM | POA: Diagnosis not present

## 2019-08-01 DIAGNOSIS — Z170 Estrogen receptor positive status [ER+]: Secondary | ICD-10-CM | POA: Diagnosis not present

## 2019-08-01 DIAGNOSIS — R41841 Cognitive communication deficit: Secondary | ICD-10-CM | POA: Diagnosis not present

## 2019-08-01 DIAGNOSIS — I5042 Chronic combined systolic (congestive) and diastolic (congestive) heart failure: Secondary | ICD-10-CM | POA: Diagnosis not present

## 2019-08-01 DIAGNOSIS — R279 Unspecified lack of coordination: Secondary | ICD-10-CM | POA: Diagnosis not present

## 2019-08-01 DIAGNOSIS — F419 Anxiety disorder, unspecified: Secondary | ICD-10-CM | POA: Diagnosis not present

## 2019-08-01 DIAGNOSIS — I110 Hypertensive heart disease with heart failure: Secondary | ICD-10-CM | POA: Diagnosis not present

## 2019-08-01 DIAGNOSIS — F329 Major depressive disorder, single episode, unspecified: Secondary | ICD-10-CM | POA: Diagnosis not present

## 2019-08-01 DIAGNOSIS — Z741 Need for assistance with personal care: Secondary | ICD-10-CM | POA: Diagnosis not present

## 2019-08-01 DIAGNOSIS — M6281 Muscle weakness (generalized): Secondary | ICD-10-CM | POA: Diagnosis not present

## 2019-08-01 DIAGNOSIS — Z48812 Encounter for surgical aftercare following surgery on the circulatory system: Secondary | ICD-10-CM | POA: Diagnosis not present

## 2019-08-01 DIAGNOSIS — I63522 Cerebral infarction due to unspecified occlusion or stenosis of left anterior cerebral artery: Secondary | ICD-10-CM | POA: Diagnosis not present

## 2019-08-01 DIAGNOSIS — R7309 Other abnormal glucose: Secondary | ICD-10-CM | POA: Diagnosis not present

## 2019-08-01 DIAGNOSIS — R262 Difficulty in walking, not elsewhere classified: Secondary | ICD-10-CM | POA: Diagnosis not present

## 2019-08-01 DIAGNOSIS — E785 Hyperlipidemia, unspecified: Secondary | ICD-10-CM | POA: Diagnosis not present

## 2019-08-01 DIAGNOSIS — I340 Nonrheumatic mitral (valve) insufficiency: Secondary | ICD-10-CM | POA: Diagnosis not present

## 2019-08-01 DIAGNOSIS — G43909 Migraine, unspecified, not intractable, without status migrainosus: Secondary | ICD-10-CM | POA: Diagnosis not present

## 2019-08-01 DIAGNOSIS — Z9011 Acquired absence of right breast and nipple: Secondary | ICD-10-CM | POA: Diagnosis not present

## 2019-08-01 DIAGNOSIS — E7849 Other hyperlipidemia: Secondary | ICD-10-CM | POA: Diagnosis not present

## 2019-08-01 DIAGNOSIS — I480 Paroxysmal atrial fibrillation: Secondary | ICD-10-CM | POA: Diagnosis not present

## 2019-08-01 DIAGNOSIS — I10 Essential (primary) hypertension: Secondary | ICD-10-CM | POA: Diagnosis not present

## 2019-08-01 DIAGNOSIS — J449 Chronic obstructive pulmonary disease, unspecified: Secondary | ICD-10-CM | POA: Diagnosis not present

## 2019-08-01 DIAGNOSIS — I5022 Chronic systolic (congestive) heart failure: Secondary | ICD-10-CM | POA: Diagnosis not present

## 2019-08-01 DIAGNOSIS — F0390 Unspecified dementia without behavioral disturbance: Secondary | ICD-10-CM | POA: Diagnosis not present

## 2019-08-01 DIAGNOSIS — J9601 Acute respiratory failure with hypoxia: Secondary | ICD-10-CM | POA: Diagnosis not present

## 2019-08-01 DIAGNOSIS — M2550 Pain in unspecified joint: Secondary | ICD-10-CM | POA: Diagnosis not present

## 2019-08-01 DIAGNOSIS — K219 Gastro-esophageal reflux disease without esophagitis: Secondary | ICD-10-CM | POA: Diagnosis not present

## 2019-08-01 DIAGNOSIS — F5109 Other insomnia not due to a substance or known physiological condition: Secondary | ICD-10-CM | POA: Diagnosis not present

## 2019-08-01 DIAGNOSIS — I517 Cardiomegaly: Secondary | ICD-10-CM | POA: Diagnosis not present

## 2019-08-01 DIAGNOSIS — C50511 Malignant neoplasm of lower-outer quadrant of right female breast: Secondary | ICD-10-CM | POA: Diagnosis not present

## 2019-08-01 DIAGNOSIS — Z7401 Bed confinement status: Secondary | ICD-10-CM | POA: Diagnosis not present

## 2019-08-01 LAB — GLUCOSE, CAPILLARY
Glucose-Capillary: 126 mg/dL — ABNORMAL HIGH (ref 70–99)
Glucose-Capillary: 133 mg/dL — ABNORMAL HIGH (ref 70–99)
Glucose-Capillary: 117 mg/dL — ABNORMAL HIGH (ref 70–99)
Glucose-Capillary: 173 mg/dL — ABNORMAL HIGH (ref 70–99)

## 2019-08-01 MED ORDER — GUAIFENESIN-DM 100-10 MG/5ML PO SYRP: 5.0000 mL | ORAL_SOLUTION | ORAL | 0 refills | Status: DC | PRN

## 2019-08-01 MED ORDER — DIAZEPAM 10 MG PO TABS: 10.0000 mg | ORAL_TABLET | Freq: Every day | ORAL | 0 refills | Status: DC

## 2019-08-01 MED ORDER — SIMVASTATIN 20 MG PO TABS: 20.0000 mg | ORAL_TABLET | Freq: Every day | ORAL | Status: DC

## 2019-08-01 MED ORDER — AMLODIPINE BESYLATE 5 MG PO TABS: 5.0000 mg | ORAL_TABLET | Freq: Every day | ORAL | Status: DC

## 2019-08-01 MED ORDER — FUROSEMIDE 20 MG PO TABS: 20.0000 mg | ORAL_TABLET | Freq: Every day | ORAL | Status: DC

## 2019-08-01 MED ORDER — CLOPIDOGREL BISULFATE 75 MG PO TABS: 75.0000 mg | ORAL_TABLET | Freq: Every day | ORAL | Status: DC

## 2019-08-01 NOTE — Discharge Summary (Addendum)
Stroke Discharge Summary  Patient ID: Kylie Tucker   MRN: [id number]      DOB: 1950-10-20  Date of Admission: 07/26/2019 Date of Discharge: 08/01/2019  Attending Physician:  Garvin Fila, MD, Stroke MD Consultant(s):   Ina Homes MD (pulmonary/intensive care, Vernell Leep MD (cardiology), Cristopher Peru, MD (electrophysiology), Alysia Penna, MD (Physical Medicine & Rehabtilitation)   Patient's PCP:  Redmond School, MD  DISCHARGE DIAGNOSIS:  Principal Problem:   Anterior cerebral circulation infarction, left [facility]), embolic, source unknown Active Problems:   Hyperlipidemia   Chronic systolic CHF (congestive heart failure), NYHA class 1 ([place])   H/O malignant neoplasm of breast   HTN (hypertension)  Allergies as of 08/01/2019       Reactions   Clonazepam Shortness Of Breath, Other (See Comments)   Heart had a "weird feeling" and difficulty breathing   Hydrocodone-acetaminophen Nausea And Vomiting   GI Upset (intolerance) Patient stated 4/28 that she is able to take Hydrocodone   Rosuvastatin Other (See Comments)   myalgia   Trazodone And Nefazodone Nausea Only, Other (See Comments)   Felt like "I was burning all over" -patient   Acetaminophen Nausea Only   Amoxicillin-pot Clavulanate Nausea Only   flushing Did it involve swelling of the face/tongue/throat, SOB, or low BP? No Did it involve sudden or severe rash/hives, skin peeling, or any reaction on the inside of your mouth or nose? No Did you need to seek medical attention at a hospital or doctor's office? Unknown When did it last happen?      5 years If all above answers are "NO", may proceed with cephalosporin use.   Cefuroxime Axetil Nausea Only   Cephalexin Nausea Only   Levofloxacin Nausea Only   Morphine And Related Other (See Comments)   Hallucinations   Ondansetron Nausea And Vomiting   Propoxyphene N-acetaminophen Nausea And Vomiting   Sulfonamide Derivatives Itching   Vioxx [rofecoxib] Nausea  And Vomiting   Aspirin Nausea And Vomiting   Gastrointestinal upset   Azithromycin Palpitations   Clarithromycin Nausea Only, Anxiety   Codeine Itching, Rash   Milk-related Compounds Nausea And Vomiting   Seroquel [quetiapine Fumarate] Palpitations   Heart racing   Sulfamethoxazole Rash   Tramadol Nausea And Vomiting        Medication List     STOP taking these medications    doxycycline 100 MG capsule Commonly known as: VIBRAMYCIN   triazolam 0.25 MG tablet Commonly known as: HALCION       TAKE these medications    albuterol 108 (90 Base) MCG/ACT inhaler Commonly known as: VENTOLIN HFA Inhale 1-2 puffs into the lungs every 6 (six) hours as needed for wheezing or shortness of breath.   amLODipine 5 MG tablet Commonly known as: NORVASC Take 1 tablet (5 mg total) by mouth daily. Start taking on: August 02, 2019   clopidogrel 75 MG tablet Commonly known as: PLAVIX Take 1 tablet (75 mg total) by mouth daily. Start taking on: August 02, 2019   diazepam 10 MG tablet Commonly known as: VALIUM Take 1 tablet (10 mg total) by mouth at bedtime.   furosemide 20 MG tablet Commonly known as: LASIX Take 1 tablet (20 mg total) by mouth daily. Start taking on: August 02, 2019   guaiFENesin-dextromethorphan 100-10 MG/5ML syrup Commonly known as: ROBITUSSIN DM Take 5 mLs by mouth every 4 (four) hours as needed for cough.   metoprolol succinate 50 MG 24 hr tablet Commonly known as: TOPROL-XL  Take 50 mg by mouth at bedtime.   multivitamin with minerals Tabs tablet Take 1 tablet by mouth daily.   simvastatin 20 MG tablet Commonly known as: ZOCOR Take 1 tablet (20 mg total) by mouth daily at 6 PM.        LABORATORY STUDIES CBC    Component Value Date/Time   WBC 7.2 07/31/2019 0319   RBC 4.65 07/31/2019 0319   HGB 13.7 07/31/2019 0319   HGB 14.1 05/25/2019 1001   HGB 13.9 05/09/2017 1249   HCT 41.3 07/31/2019 0319   HCT 41.7 05/09/2017 1249   PLT 234  07/31/2019 0319   PLT 254 05/25/2019 1001   PLT 274 05/09/2017 1249   MCV 88.8 07/31/2019 0319   MCV 87.9 05/09/2017 1249   MCH 29.5 07/31/2019 0319   MCHC 33.2 07/31/2019 0319   RDW 14.9 07/31/2019 0319   RDW 14.2 05/09/2017 1249   LYMPHSABS 1.8 07/26/2019 0524   LYMPHSABS 2.1 05/09/2017 1249   MONOABS 0.5 07/26/2019 0524   MONOABS 0.6 05/09/2017 1249   EOSABS 0.0 07/26/2019 0524   EOSABS 0.3 05/09/2017 1249   BASOSABS 0.1 07/26/2019 0524   BASOSABS 0.1 05/09/2017 1249   CMP    Component Value Date/Time   NA 140 07/31/2019 0319   NA 136 05/09/2017 1249   K 3.6 07/31/2019 0319   K 5.0 05/09/2017 1249   CL 103 07/31/2019 0319   CL 103 12/08/2012 0810   CO2 27 07/31/2019 0319   CO2 30 (H) 05/09/2017 1249   GLUCOSE 98 07/31/2019 0319   GLUCOSE 89 05/09/2017 1249   GLUCOSE 96 12/08/2012 0810   BUN 14 07/31/2019 0319   BUN 13.7 05/09/2017 1249   CREATININE 1.11 (H) 07/31/2019 0319   CREATININE 1.10 (H) 05/25/2019 1001   CREATININE 1.0 05/09/2017 1249   CALCIUM 8.4 (L) 07/31/2019 0319   CALCIUM 9.9 05/09/2017 1249   PROT 6.5 07/26/2019 0524   PROT 7.1 05/09/2017 1249   ALBUMIN 3.3 (L) 07/26/2019 0524   ALBUMIN 3.8 05/09/2017 1249   AST 34 07/26/2019 0524   AST 25 05/25/2019 1001   AST 16 05/09/2017 1249   ALT 30 07/26/2019 0524   ALT 40 05/25/2019 1001   ALT 13 05/09/2017 1249   ALKPHOS 64 07/26/2019 0524   ALKPHOS 92 05/09/2017 1249   BILITOT 0.9 07/26/2019 0524   BILITOT 0.3 05/25/2019 1001   BILITOT 0.35 05/09/2017 1249   GFRNONAA 51 (L) 07/31/2019 0319   GFRNONAA 52 (L) 05/25/2019 1001   GFRAA 59 (L) 07/31/2019 0319   GFRAA 60 (L) 05/25/2019 1001   COAGS Lab Results  Component Value Date   INR 1.1 07/26/2019   INR 0.87 04/05/2018   Lipid Panel    Component Value Date/Time   CHOL 159 07/27/2019 0651   TRIG 120 07/27/2019 0651   HDL 35 (L) 07/27/2019 0651   CHOLHDL 4.5 07/27/2019 0651   VLDL 24 07/27/2019 0651   LDLCALC 100 (H) 07/27/2019 0651    HgbA1C  Lab Results  Component Value Date   HGBA1C 6.2 (H) 07/27/2019    Alcohol Level    Component Value Date/Time   ETH <10 07/26/2019 0524   SIGNIFICANT DIAGNOSTIC STUDIES CT ANGIO HEAD W OR WO CONTRAST  Result Date: 07/26/2019 CLINICAL DATA:  In attention and right leg weakness, sudden onset. EXAM: CT ANGIOGRAPHY HEAD AND NECK CT PERFUSION BRAIN TECHNIQUE: Multidetector CT imaging of the head and neck was performed using the standard protocol during bolus administration of intravenous  contrast. Multiplanar CT image reconstructions and MIPs were obtained to evaluate the vascular anatomy. Carotid stenosis measurements (when applicable) are obtained utilizing NASCET criteria, using the distal internal carotid diameter as the denominator. Multiphase CT imaging of the brain was performed following IV bolus contrast injection. Subsequent parametric perfusion maps were calculated using RAPID software. CONTRAST:  12mL OMNIPAQUE IOHEXOL 350 MG/ML SOLN COMPARISON:  Noncontrast head CT earlier today FINDINGS: CTA NECK FINDINGS Aortic arch: Mild atheromatous changes. Three vessel branching. No dilatation. Right carotid system: No noted atheromatous changes. ICA tortuosity with loop. Left carotid system: Smaller vessel size to circle-of-Willis variant. ICA looping at the skull base. No noted atheromatous changes, stenosis, or ulceration. Vertebral arteries: No proximal subclavian stenosis or atherosclerosis. Mild left vertebral artery dominance. Both vertebral arteries are smooth and widely patent to the dura. Skeleton: Cervical spine degeneration and bilateral mandibular osteoplasty Other neck: No acute finding Upper chest: Layering pleural effusions. Cardiomegaly on preceding x-ray. Review of the MIP images confirms the above findings CTA HEAD FINDINGS Anterior circulation: Aplastic left A1 segment. Left A2 short segment occlusion with distal reconstitution, acute appearing and likely embolic. Negative  for aneurysm Posterior circulation: Smooth vertebral and basilar arteries. No PCA branch occlusion. Venous sinuses: Negative for aneurysm. Anatomic variants: As above Review of the MIP images confirms the above findings CT Brain Perfusion Findings: ASPECTS: 10 CBF (<30%) Volume: 78mL Perfusion (Tmax>6.0s) volume: 61mL Mismatch Volume: 50mL Infarction Location:Left frontal These results were called by telephone at the time of interpretation on 07/26/2019 at 7:49 am to provider Pollina , who verbally acknowledged these results. IMPRESSION: 1. Left A2 embolism with short segment occlusion. There is an associated 8 cc core infarct and 22 cc of penumbra. 2. Minimal atheromatous changes. There is however cardiomegaly and layering pleural effusions, question cardiac source. 3. Bilateral ICA looping in the neck.  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 Electronically Signed   By: Monte Fantasia M.D.   On: 07/26/2019 07:53   DG Chest 2 View  Result Date: 07/13/2019 CLINICAL DATA:  Patient c/o cough, sob, wheezing, congestion x 3 weeks. Hx of htn, rt side breast cancer w/ mastectomy, pneumonia. Non smoker EXAM: CHEST - 2 VIEW COMPARISON:  Chest radiograph 04/05/2018 FINDINGS: Stable cardiomediastinal contours with mildly enlarged heart size. Central venous congestion. There are new heterogeneous opacities at the right greater than left lung bases and small volume right pleural fluid. Linear opacities in the lingula likely represents scarring. There is scattered atelectasis. No pneumothorax. No acute finding in the visualized skeleton. IMPRESSION: New heterogeneous opacities at the right greater than left lung bases could represent atelectasis, infiltrate not excluded. Scattered atelectasis. Small volume right pleural effusion. Electronically Signed   By: Audie Pinto M.D.   On: 07/13/2019 17:37   CT HEAD WO CONTRAST  Result Date: 07/27/2019 CLINICAL DATA:  Stroke, follow-up. Noncontrast CT head, CT angiogram head/neck and CT  perfusion 07/26/2019 EXAM: CT HEAD WITHOUT CONTRAST TECHNIQUE: Contiguous axial images were obtained from the base of the skull through the vertex without intravenous contrast. COMPARISON:  Noncontrast CT head, CT angiogram head/neck and CT perfusion 07/26/2019 FINDINGS: Brain: There is loss of gray-white differentiation within portions of the anterior left frontal lobe, left ACA vascular territory, consistent with acute infarction (series 3, images 17-22) (series 5, images 15-21). No evidence of hemorrhagic conversion. No significant mass effect. No midline shift. No evidence of intracranial mass. No extra-axial fluid collection. Vascular: Hyperdense vessel. Skull: Normal. Negative for fracture or focal lesion. Sinuses/Orbits: Visualized orbits demonstrate  no acute abnormality. Postsurgical appearance of the paranasal sinuses. No significant paranasal sinus disease or mastoid effusion at the imaged levels. IMPRESSION: Acute left frontal lobe ACA territory ischemic infarct. No hemorrhagic conversion. No significant mass effect. Electronically Signed   By: Kellie Simmering DO   On: 07/27/2019 07:41   CT ANGIO NECK W OR WO CONTRAST  Result Date: 07/26/2019 CLINICAL DATA:  In attention and right leg weakness, sudden onset. EXAM: CT ANGIOGRAPHY HEAD AND NECK CT PERFUSION BRAIN TECHNIQUE: Multidetector CT imaging of the head and neck was performed using the standard protocol during bolus administration of intravenous contrast. Multiplanar CT image reconstructions and MIPs were obtained to evaluate the vascular anatomy. Carotid stenosis measurements (when applicable) are obtained utilizing NASCET criteria, using the distal internal carotid diameter as the denominator. Multiphase CT imaging of the brain was performed following IV bolus contrast injection. Subsequent parametric perfusion maps were calculated using RAPID software. CONTRAST:  146mL OMNIPAQUE IOHEXOL 350 MG/ML SOLN COMPARISON:  Noncontrast head CT earlier  today FINDINGS: CTA NECK FINDINGS Aortic arch: Mild atheromatous changes. Three vessel branching. No dilatation. Right carotid system: No noted atheromatous changes. ICA tortuosity with loop. Left carotid system: Smaller vessel size to circle-of-Willis variant. ICA looping at the skull base. No noted atheromatous changes, stenosis, or ulceration. Vertebral arteries: No proximal subclavian stenosis or atherosclerosis. Mild left vertebral artery dominance. Both vertebral arteries are smooth and widely patent to the dura. Skeleton: Cervical spine degeneration and bilateral mandibular osteoplasty Other neck: No acute finding Upper chest: Layering pleural effusions. Cardiomegaly on preceding x-ray. Review of the MIP images confirms the above findings CTA HEAD FINDINGS Anterior circulation: Aplastic left A1 segment. Left A2 short segment occlusion with distal reconstitution, acute appearing and likely embolic. Negative for aneurysm Posterior circulation: Smooth vertebral and basilar arteries. No PCA branch occlusion. Venous sinuses: Negative for aneurysm. Anatomic variants: As above Review of the MIP images confirms the above findings CT Brain Perfusion Findings: ASPECTS: 10 CBF (<30%) Volume: 33mL Perfusion (Tmax>6.0s) volume: 42mL Mismatch Volume: 67mL Infarction Location:Left frontal These results were called by telephone at the time of interpretation on 07/26/2019 at 7:49 am to provider Pollina , who verbally acknowledged these results. IMPRESSION: 1. Left A2 embolism with short segment occlusion. There is an associated 8 cc core infarct and 22 cc of penumbra. 2. Minimal atheromatous changes. There is however cardiomegaly and layering pleural effusions, question cardiac source. 3. Bilateral ICA looping in the neck.  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 Electronically Signed   By: Monte Fantasia M.D.   On: 07/26/2019 07:53   MR MRA HEAD WO CONTRAST  Result Date: 07/28/2019 CLINICAL DATA:  Follow-up examination for acute stroke.  EXAM: MRI HEAD WITHOUT CONTRAST MRA HEAD WITHOUT CONTRAST TECHNIQUE: Multiplanar, multiecho pulse sequences of the brain and surrounding structures were obtained without intravenous contrast. Angiographic images of the head were obtained using MRA technique without contrast. COMPARISON:  Prior studies from 07/26/2019. FINDINGS: MRI HEAD FINDINGS Brain: Cerebral volume within normal limits for age. Mild chronic microvascular ischemic changes present within the periventricular deep white matter both cerebral hemispheres. Restricted diffusion involving the parasagittal anterior left frontal lobe compatible with acute left ACA territory infarct. Associated minimal petechial hemorrhage without hemorrhagic transformation or significant mass effect. Multiple additional scattered subcentimeter acute ischemic infarcts seen involving the cortical and subcortical aspect of both cerebral hemispheres. For reference purposes, the largest of these foci measures 6 mm at the right occipital lobe. No associated hemorrhage or mass effect. Gray-white matter differentiation  otherwise maintained. No other areas of remote cortical infarction. No other evidence for acute or chronic intracranial hemorrhage. No mass lesion, midline shift or mass effect. No hydrocephalus. No extra-axial fluid collection. Pituitary gland suprasellar region normal. Midline structures intact. Vascular: Major intracranial vascular flow voids are maintained. Skull and upper cervical spine: Craniocervical junction within normal limits. Bone marrow signal intensity normal. No scalp soft tissue abnormality. Sinuses/Orbits: Patient status post bilateral ocular lens replacement. Paranasal sinuses are largely clear. No significant mastoid effusion. Inner ear structures grossly normal. Other: None. MRA HEAD FINDINGS ANTERIOR CIRCULATION: Tortuosity of the distal cervical ICAs noted bilaterally. Internal carotid arteries widely patent to the termini without stenosis.  Widely patent right A1. Absent left A1, accounting for the diminutive left ICA is compared to the right. Normal anterior communicating artery. There has been interval revascularization of previously seen short-segment left A2 occlusion, with the visualized ACAs now patent to their distal aspects. M1 segments widely patent. Normal MCA bifurcations. Distal MCA branches well perfused and symmetric. POSTERIOR CIRCULATION: Vertebral arteries widely patent to the vertebrobasilar junction without stenosis. Dominant left vertebral artery. Posterior inferior cerebral arteries patent bilaterally. Basilar patent to its distal aspect without stenosis. Superior cerebral arteries patent bilaterally. Both PCAs primarily supplied via the basilar and are well perfused to their distal aspects. No intracranial aneurysm. IMPRESSION: MRI HEAD IMPRESSION: 1. Evolving acute ischemic left ACA territory infarct. Associated minimal petechial hemorrhage without hemorrhagic transformation or mass effect. 2. Additional scattered small volume subcentimeter cortical and subcortical ischemic infarcts involving both cerebral hemispheres. 3. Underlying mild chronic microvascular ischemic disease. MRA HEAD IMPRESSION: 1. Interval revascularization of previously seen short-segment left A2 occlusion. No large vessel occlusion now evident. 2. Mild atherosclerotic change elsewhere throughout the intracranial circulation. No other hemodynamically significant or correctable stenosis. Electronically Signed   By: Jeannine Boga M.D.   On: 07/28/2019 05:23   MR BRAIN WO CONTRAST  Result Date: 07/28/2019 CLINICAL DATA:  Follow-up examination for acute stroke. EXAM: MRI HEAD WITHOUT CONTRAST MRA HEAD WITHOUT CONTRAST TECHNIQUE: Multiplanar, multiecho pulse sequences of the brain and surrounding structures were obtained without intravenous contrast. Angiographic images of the head were obtained using MRA technique without contrast. COMPARISON:  Prior  studies from 07/26/2019. FINDINGS: MRI HEAD FINDINGS Brain: Cerebral volume within normal limits for age. Mild chronic microvascular ischemic changes present within the periventricular deep white matter both cerebral hemispheres. Restricted diffusion involving the parasagittal anterior left frontal lobe compatible with acute left ACA territory infarct. Associated minimal petechial hemorrhage without hemorrhagic transformation or significant mass effect. Multiple additional scattered subcentimeter acute ischemic infarcts seen involving the cortical and subcortical aspect of both cerebral hemispheres. For reference purposes, the largest of these foci measures 6 mm at the right occipital lobe. No associated hemorrhage or mass effect. Gray-white matter differentiation otherwise maintained. No other areas of remote cortical infarction. No other evidence for acute or chronic intracranial hemorrhage. No mass lesion, midline shift or mass effect. No hydrocephalus. No extra-axial fluid collection. Pituitary gland suprasellar region normal. Midline structures intact. Vascular: Major intracranial vascular flow voids are maintained. Skull and upper cervical spine: Craniocervical junction within normal limits. Bone marrow signal intensity normal. No scalp soft tissue abnormality. Sinuses/Orbits: Patient status post bilateral ocular lens replacement. Paranasal sinuses are largely clear. No significant mastoid effusion. Inner ear structures grossly normal. Other: None. MRA HEAD FINDINGS ANTERIOR CIRCULATION: Tortuosity of the distal cervical ICAs noted bilaterally. Internal carotid arteries widely patent to the termini without stenosis. Widely patent right A1. Absent left  A1, accounting for the diminutive left ICA is compared to the right. Normal anterior communicating artery. There has been interval revascularization of previously seen short-segment left A2 occlusion, with the visualized ACAs now patent to their distal aspects.  M1 segments widely patent. Normal MCA bifurcations. Distal MCA branches well perfused and symmetric. POSTERIOR CIRCULATION: Vertebral arteries widely patent to the vertebrobasilar junction without stenosis. Dominant left vertebral artery. Posterior inferior cerebral arteries patent bilaterally. Basilar patent to its distal aspect without stenosis. Superior cerebral arteries patent bilaterally. Both PCAs primarily supplied via the basilar and are well perfused to their distal aspects. No intracranial aneurysm. IMPRESSION: MRI HEAD IMPRESSION: 1. Evolving acute ischemic left ACA territory infarct. Associated minimal petechial hemorrhage without hemorrhagic transformation or mass effect. 2. Additional scattered small volume subcentimeter cortical and subcortical ischemic infarcts involving both cerebral hemispheres. 3. Underlying mild chronic microvascular ischemic disease. MRA HEAD IMPRESSION: 1. Interval revascularization of previously seen short-segment left A2 occlusion. No large vessel occlusion now evident. 2. Mild atherosclerotic change elsewhere throughout the intracranial circulation. No other hemodynamically significant or correctable stenosis. Electronically Signed   By: Jeannine Boga M.D.   On: 07/28/2019 05:23   IR CT Head Ltd  Result Date: 07/26/2019 INDICATION: Acute onset of right leg weakness and confusion. Occluded left anterior cerebral artery A2 segment and distally on CT angiogram of the head and neck.   EXAM: 1. EMERGENT LARGE VESSEL OCCLUSION THROMBOLYSIS anterior CIRCULATION)   COMPARISON:  CT angiogram of the head and neck of July 26, 2019.   MEDICATIONS: Vancomycin 1 g IV antibiotic was administered within 1 hour of the procedure.   ANESTHESIA/SEDATION: General anesthesia.   CONTRAST:  Isovue 300 approximately 90 cc.   FLUOROSCOPY TIME:  Fluoroscopy Time: 26 minutes 12 seconds (1676 mGy).   COMPLICATIONS: None immediate.   TECHNIQUE: Following a full explanation of the procedure  along with the potential associated complications, an informed witnessed consent was obtained. The risks of intracranial hemorrhage of 10%, worsening neurological deficit, ventilator dependency, death and inability to revascularize were all reviewed in detail with the patient.   The patient was then put under general anesthesia by the Department of Anesthesiology at [facility].   The right groin was prepped and draped in the usual sterile fashion. Thereafter using modified Seldinger technique, transfemoral access into the right common femoral artery was obtained without difficulty. Over a 0.035 inch guidewire a 5 French Pinnacle sheath was inserted. Through this, and also over a 0.035 inch guidewire a 5 [place] JB 1 catheter was advanced to the aortic arch region and selectively positioned in the innominate artery, the right common carotid artery, the left common carotid artery and the left subclavian artery.   FINDINGS: The left vertebral artery origin is widely patent.   The vessel is seen to opacify to the cranial skull base. Wide patency is seen of the left vertebrobasilar junction and the left posteroinferior cerebral artery.   The opacified portion of the basilar artery, the posterior cerebral artery, the superior cerebellar arteries and anterior-inferior cerebellar arteries is widely patent into the capillary and venous phases. Unopacified blood is seen in the basilar artery from the contralateral vertebral artery.   The left common carotid arteriogram demonstrates the left external carotid artery and its major branches to be widely patent.   The left internal carotid artery at the bulb to the cranial skull base demonstrates wide patency with mild tortuosity at the distal cervical segment.   More distally, the petrous, cavernous and  supraclinoid segments are widely patent.   The left middle cerebral artery is seen to opacify into the capillary and venous phases. No angiographic presence of the left  anterior cerebral A1 segment is seen.   The innominate arteriogram demonstrates the right subclavian artery and the right common carotid artery origins to be widely patent.   The right vertebral artery origin demonstrates mild stenosis at its origin.   Otherwise, the vessel is seen to opacify to the cranial skull base. The right common carotid arteriogram reveals mild stenosis of the right external carotid origin. Its branches are seen opacified.   The right internal carotid artery at the bulb to the cranial skull base is widely patent with moderate O shaped tortuosity of the junction of the middle and the distal 1/3 segments. More distally, the petrous, cavernous and the supraclinoid segments widely patent.   The right middle cerebral artery and the right anterior cerebral artery opacify into the capillary and venous phases. Prompt cross filling via the anterior communicating artery of the right anterior cerebral artery A2 segment proximally is seen with evidence of a stump consistent with an occlusion.   PROCEDURE: The diagnostic JB 1 catheter in the right common carotid artery was then exchanged over a 0.035 inch 300 cm Rosen exchange guidewire for an 8 [place] Pinnacle sheath which was connected to continuous heparinized saline infusion. Over the exchange guidewire, an 087 Walrus balloon guide catheter which had been prepped with 50% contrast and 50% heparinized saline infusion was advanced and positioned in the proximal right internal carotid artery. The guidewire was removed. Good aspiration was obtained from the hub of the balloon guide catheter. A gentle control arteriogram performed through this demonstrated no change in the extracranial or intracranial circulations.   Over a 0.014 inch standard Synchro micro guidewire with a J configuration, a combination of 150 cm Trevo ProVue microcatheter inside of a 6 French 132 cm Catalyst guide catheter was advanced without difficulty to the supraclinoid right ICA.    The micro guidewire was then gently manipulated with a torque device and advanced into the right anterior cerebral A1 segment followed by the advancement of the micro guidewire without difficulty through the anterior communicating artery into the left anterior cerebral artery distal A2 A3 segment followed by the microcatheter. The guidewire was removed. Good aspiration obtained from the hub of the microcatheter.   A gentle injection through the microcatheter demonstrated safe position of tip of the microcatheter which was then connected to continuous heparinized saline infusion.   A 33 mm x 33 mm Trevo ProVue retrieval device was then advanced to the distal end of the microcatheter. The O ring on the delivery microcatheter was loosened. With slight forward gentle traction with the right hand on the delivery micro guidewire with the left hand the delivery microcatheter was retrieved unsheathing the distal and then the proximal portion of the retrieval device.   The Catalyst guide catheter was advanced to just proximal to the occluded segment of the left anterior cerebral A2 segment proximally. With proximal flow arrest in the right internal carotid artery by inflating the balloon of the [facility] guide catheter and constant aspiration being applied at the hub of the balloon guide catheter with a 60 mL syringe, and with a Penumbra aspiration device at the hub of the 6 [place] Catalyst guide catheter for approximately 2-1/2 minutes, the combination of the retrieval device, the microcatheter and the 6 [place] Catalyst guide catheter were retrieved and removed. Aspiration at the balloon  guide catheter in the right internal carotid artery was continued as proximal flow arrest was reversed. The aspirated contained multiple small bits of dark clots and also in the canister.   A control arteriogram performed through the balloon guide catheter in the right internal carotid artery demonstrated complete angiographic  revascularization of the left anterior cerebral A1 A2 and A3 segments achieving a TICI 3 revascularization.   Mild spasm of the right anterior cerebral A1 segment responded to 25 mcg of nitroglycerin intra-arterially.   No evidence of intraluminal filling defects or of occlusions were seen in the right middle cerebral artery, right anterior cerebral artery and the left anterior cerebral artery distribution.   Throughout the procedure, the patient's blood pressure and neurological status remained stable. No evidence of mass-effect or mass midline shift or of extravasation was seen.   The balloon guide catheter was removed. The 8 French Pinnacle sheath was then left in situ on account of the patient being on IV tPA. Also, the patient was left intubated because of poor communication prior to the procedure and to protect her airway.   A flat panel CT of the brain demonstrated no evidence of mass effect or midline shift or of hemorrhage.   The right groin appeared soft. The dorsalis pedis, and the posterior tibial arteries bilaterally demonstrated Dopplerable presence.   The patient was then transferred to the neuro ICU intubated for post thrombectomy management.   IMPRESSION: Status post endovascular complete revascularization of the left anterior cerebral A2 segment and distally with 1 pass with the 3 mm x 33 mm Trevo ProVue retrieval device, and Penumbra aspiration achieving a TICI 3 revascularization.   PLAN: Follow up in the clinic 4 weeks post discharge.     Electronically Signed   By: Luanne Bras M.D.   On: 07/27/2019 12:44    CT CEREBRAL PERFUSION W CONTRAST  Result Date: 07/26/2019 CLINICAL DATA:  In attention and right leg weakness, sudden onset. EXAM: CT ANGIOGRAPHY HEAD AND NECK CT PERFUSION BRAIN TECHNIQUE: Multidetector CT imaging of the head and neck was performed using the standard protocol during bolus administration of intravenous contrast. Multiplanar CT image reconstructions and MIPs were  obtained to evaluate the vascular anatomy. Carotid stenosis measurements (when applicable) are obtained utilizing NASCET criteria, using the distal internal carotid diameter as the denominator. Multiphase CT imaging of the brain was performed following IV bolus contrast injection. Subsequent parametric perfusion maps were calculated using RAPID software. CONTRAST:  186mL OMNIPAQUE IOHEXOL 350 MG/ML SOLN COMPARISON:  Noncontrast head CT earlier today FINDINGS: CTA NECK FINDINGS Aortic arch: Mild atheromatous changes. Three vessel branching. No dilatation. Right carotid system: No noted atheromatous changes. ICA tortuosity with loop. Left carotid system: Smaller vessel size to circle-of-Willis variant. ICA looping at the skull base. No noted atheromatous changes, stenosis, or ulceration. Vertebral arteries: No proximal subclavian stenosis or atherosclerosis. Mild left vertebral artery dominance. Both vertebral arteries are smooth and widely patent to the dura. Skeleton: Cervical spine degeneration and bilateral mandibular osteoplasty Other neck: No acute finding Upper chest: Layering pleural effusions. Cardiomegaly on preceding x-ray. Review of the MIP images confirms the above findings CTA HEAD FINDINGS Anterior circulation: Aplastic left A1 segment. Left A2 short segment occlusion with distal reconstitution, acute appearing and likely embolic. Negative for aneurysm Posterior circulation: Smooth vertebral and basilar arteries. No PCA branch occlusion. Venous sinuses: Negative for aneurysm. Anatomic variants: As above Review of the MIP images confirms the above findings CT Brain Perfusion Findings: ASPECTS: 10 CBF (<30%)  Volume: 38mL Perfusion (Tmax>6.0s) volume: 74mL Mismatch Volume: 65mL Infarction Location:Left frontal These results were called by telephone at the time of interpretation on 07/26/2019 at 7:49 am to provider Pollina , who verbally acknowledged these results. IMPRESSION: 1. Left A2 embolism with short  segment occlusion. There is an associated 8 cc core infarct and 22 cc of penumbra. 2. Minimal atheromatous changes. There is however cardiomegaly and layering pleural effusions, question cardiac source. 3. Bilateral ICA looping in the neck.  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 Electronically Signed   By: Monte Fantasia M.D.   On: 07/26/2019 07:53   ECHOCARDIOGRAM COMPLETE  Result Date: 07/26/2019   ECHOCARDIOGRAM REPORT   Patient Name:   Kylie Tucker Date of Exam: 07/26/2019 Medical Rec #:  [id number]        Height:       62.0 in Accession #:    [id number]       Weight:       183.1 lb Date of Birth:  Jun 17, 1951        BSA:          1.84 m Patient Age:    30 years         BP:           121/51 mmHg Patient Gender: F                HR:           66 bpm. Exam Location:  Inpatient Procedure: 2D Echo, Cardiac Doppler and Color Doppler Indications:    Stroke 434.91  History:        Patient has prior history of Echocardiogram examinations, most                 recent 04/06/2018. CHF, Stroke, Arrythmias:LBBB,                 Signs/Symptoms:Chest Pain and Dyspnea; Risk                 Factors:Hypertension, Dyslipidemia and Non-Smoker. Elevated                 troponin.  Sonographer:    Paulita Fujita RDCS Referring Phys: Letts  1. Left ventricular ejection fraction, by visual estimation, is 25 to 30%. The left ventricle has moderate to severely decreased function. Left ventricular septal wall thickness was normal. Normal left ventricular posterior wall thickness. There is no left ventricular hypertrophy.  2. The left ventricle demonstrates global hypokinesis.  3. Global right ventricle has normal systolic function.The right ventricular size is normal. No increase in right ventricular wall thickness.  4. Left atrial size was mildly dilated.  5. Right atrial size was normal.  6. Large pleural effusion in the left lateral region.  7. Small pericardial effusion.  8. The pericardial effusion is posterior to the  left ventricle.  9. The mitral valve is normal in structure. Mild mitral valve regurgitation. No evidence of mitral stenosis. 10. The tricuspid valve is normal in structure. 11. The aortic valve is tricuspid. Aortic valve regurgitation is not visualized. No evidence of aortic valve sclerosis or stenosis. 12. Pulmonic regurgitation is mild. 13. The pulmonic valve was normal in structure. Pulmonic valve regurgitation is mild. 14. Moderately elevated pulmonary artery systolic pressure. 15. The inferior vena cava is normal in size with <50% respiratory variability, suggesting right atrial pressure of 8 mmHg. FINDINGS  Left Ventricle: Left ventricular ejection fraction, by visual estimation, is 25 to 30%. The  left ventricle has moderate to severely decreased function. The left ventricle demonstrates global hypokinesis. Normal left ventricular posterior wall thickness.  There is no left ventricular hypertrophy. Normal left atrial pressure. Right Ventricle: The right ventricular size is normal. No increase in right ventricular wall thickness. Global RV systolic function is has normal systolic function. The tricuspid regurgitant velocity is 3.11 m/s, and with an assumed right atrial pressure  of 8 mmHg, the estimated right ventricular systolic pressure is moderately elevated at 46.7 mmHg. Left Atrium: Left atrial size was mildly dilated. Right Atrium: Right atrial size was normal in size Pericardium: A small pericardial effusion is present. The pericardial effusion is posterior to the left ventricle. There is a large pleural effusion in the left lateral region. Mitral Valve: The mitral valve is normal in structure. Mild mitral valve regurgitation. No evidence of mitral valve stenosis by observation. Tricuspid Valve: The tricuspid valve is normal in structure. Tricuspid valve regurgitation is mild. Aortic Valve: The aortic valve is tricuspid. Aortic valve regurgitation is not visualized. The aortic valve is structurally  normal, with no evidence of sclerosis or stenosis. Pulmonic Valve: The pulmonic valve was normal in structure. Pulmonic valve regurgitation is mild. Pulmonic regurgitation is mild. Aorta: The aortic root, ascending aorta and aortic arch are all structurally normal, with no evidence of dilitation or obstruction. Venous: The inferior vena cava is normal in size with less than 50% respiratory variability, suggesting right atrial pressure of 8 mmHg. IAS/Shunts: No atrial level shunt detected by color flow Doppler. There is no evidence of a patent foramen ovale. No ventricular septal defect is seen or detected. There is no evidence of an atrial septal defect.  LEFT VENTRICLE PLAX 2D LVIDd:         4.90 cm LVIDs:         4.40 cm LV PW:         0.80 cm LV IVS:        0.80 cm LVOT diam:     1.60 cm LV SV:         25 ml LV SV Index:   12.94 LVOT Area:     2.01 cm  LV Volumes (MOD) LV area d, A2C:    48.70 cm LV area d, A4C:    47.40 cm LV area s, A2C:    36.00 cm LV area s, A4C:    38.30 cm LV major d, A2C:   9.76 cm LV major d, A4C:   9.44 cm LV major s, A2C:   8.58 cm LV major s, A4C:   8.60 cm LV vol d, MOD A2C: 203.0 ml LV vol d, MOD A4C: 197.0 ml LV vol s, MOD A2C: 132.0 ml LV vol s, MOD A4C: 140.0 ml LV SV MOD A2C:     71.0 ml LV SV MOD A4C:     197.0 ml LV SV MOD BP:      67.8 ml RIGHT VENTRICLE RV S prime:     10.20 cm/s TAPSE (M-mode): 2.1 cm LEFT ATRIUM           Index       RIGHT ATRIUM           Index LA diam:      3.70 cm 2.01 cm/m  RA Area:     16.20 cm LA Vol (A4C): 53.6 ml 29.11 ml/m RA Volume:   43.10 ml  23.41 ml/m  AORTIC VALVE LVOT Vmax:   110.00 cm/s LVOT Vmean:  75.800 cm/s LVOT  VTI:    0.237 m  AORTA Ao Root diam: 3.10 cm MITRAL VALVE                        TRICUSPID VALVE MV Area (PHT): 4.15 cm             TV Peak grad:   23.8 mmHg MV PHT:        53.07 msec           TV Vmax:        2.44 m/s MV Decel Time: 183 msec             TR Peak grad:   38.7 mmHg MR Peak grad: 90.4 mmHg             TR  Vmax:        311.00 cm/s MR Vmax:      475.50 cm/s MV E velocity: 90.80 cm/s 103 cm/s  SHUNTS MV A velocity: 79.70 cm/s 70.3 cm/s Systemic VTI:  0.24 m MV E/A ratio:  1.14       1.5       Systemic Diam: 1.60 cm  Skeet Latch MD Electronically signed by Skeet Latch MD Signature Date/Time: 07/26/2019/7:27:24 PM    Final    IR PERCUTANEOUS ART THROMBECTOMY/INFUSION INTRACRANIAL INC DIAG ANGIO  Result Date: 07/30/2019 INDICATION: Acute onset of right leg weakness and confusion. Occluded left anterior cerebral artery A2 segment and distally on CT angiogram of the head and neck. EXAM: 1. EMERGENT LARGE VESSEL OCCLUSION THROMBOLYSIS anterior CIRCULATION) COMPARISON:  CT angiogram of the head and neck of July 26, 2019. MEDICATIONS: Vancomycin 1 g IV antibiotic was administered within 1 hour of the procedure. ANESTHESIA/SEDATION: General anesthesia. CONTRAST:  Isovue 300 approximately 90 cc. FLUOROSCOPY TIME:  Fluoroscopy Time: 26 minutes 12 seconds (1676 mGy). COMPLICATIONS: None immediate. TECHNIQUE: Following a full explanation of the procedure along with the potential associated complications, an informed witnessed consent was obtained. The risks of intracranial hemorrhage of 10%, worsening neurological deficit, ventilator dependency, death and inability to revascularize were all reviewed in detail with the patient. The patient was then put under general anesthesia by the Department of Anesthesiology at [facility]. The right groin was prepped and draped in the usual sterile fashion. Thereafter using modified Seldinger technique, transfemoral access into the right common femoral artery was obtained without difficulty. Over a 0.035 inch guidewire a 5 French Pinnacle sheath was inserted. Through this, and also over a 0.035 inch guidewire a 5 [place] JB 1 catheter was advanced to the aortic arch region and selectively positioned in the innominate artery, the right common carotid artery, the left common  carotid artery and the left subclavian artery. FINDINGS: The left vertebral artery origin is widely patent. The vessel is seen to opacify to the cranial skull base. Wide patency is seen of the left vertebrobasilar junction and the left posteroinferior cerebral artery. The opacified portion of the basilar artery, the posterior cerebral artery, the superior cerebellar arteries and anterior-inferior cerebellar arteries is widely patent into the capillary and venous phases. Unopacified blood is seen in the basilar artery from the contralateral vertebral artery. The left common carotid arteriogram demonstrates the left external carotid artery and its major branches to be widely patent. The left internal carotid artery at the bulb to the cranial skull base demonstrates wide patency with mild tortuosity at the distal cervical segment. More distally, the petrous, cavernous and supraclinoid segments are widely patent. The left  middle cerebral artery is seen to opacify into the capillary and venous phases. No angiographic presence of the left anterior cerebral A1 segment is seen. The innominate arteriogram demonstrates the right subclavian artery and the right common carotid artery origins to be widely patent. The right vertebral artery origin demonstrates mild stenosis at its origin. Otherwise, the vessel is seen to opacify to the cranial skull base. The right common carotid arteriogram reveals mild stenosis of the right external carotid origin. Its branches are seen opacified. The right internal carotid artery at the bulb to the cranial skull base is widely patent with moderate O shaped tortuosity of the junction of the middle and the distal 1/3 segments. More distally, the petrous, cavernous and the supraclinoid segments widely patent. The right middle cerebral artery and the right anterior cerebral artery opacify into the capillary and venous phases. Prompt cross filling via the anterior communicating artery of the right  anterior cerebral artery A2 segment proximally is seen with evidence of a stump consistent with an occlusion. PROCEDURE: The diagnostic JB 1 catheter in the right common carotid artery was then exchanged over a 0.035 inch 300 cm Rosen exchange guidewire for an 8 [place] Pinnacle sheath which was connected to continuous heparinized saline infusion. Over the exchange guidewire, an 087 Walrus balloon guide catheter which had been prepped with 50% contrast and 50% heparinized saline infusion was advanced and positioned in the proximal right internal carotid artery. The guidewire was removed. Good aspiration was obtained from the hub of the balloon guide catheter. A gentle control arteriogram performed through this demonstrated no change in the extracranial or intracranial circulations. Over a 0.014 inch standard Synchro micro guidewire with a J configuration, a combination of 150 cm Trevo ProVue microcatheter inside of a 6 French 132 cm Catalyst guide catheter was advanced without difficulty to the supraclinoid right ICA. The micro guidewire was then gently manipulated with a torque device and advanced into the right anterior cerebral A1 segment followed by the advancement of the micro guidewire without difficulty through the anterior communicating artery into the left anterior cerebral artery distal A2 A3 segment followed by the microcatheter. The guidewire was removed. Good aspiration obtained from the hub of the microcatheter. A gentle injection through the microcatheter demonstrated safe position of tip of the microcatheter which was then connected to continuous heparinized saline infusion. A 33 mm x 33 mm Trevo ProVue retrieval device was then advanced to the distal end of the microcatheter. The O ring on the delivery microcatheter was loosened. With slight forward gentle traction with the right hand on the delivery micro guidewire with the left hand the delivery microcatheter was retrieved unsheathing the distal  and then the proximal portion of the retrieval device. The Catalyst guide catheter was advanced to just proximal to the occluded segment of the left anterior cerebral A2 segment proximally. With proximal flow arrest in the right internal carotid artery by inflating the balloon of the [facility] guide catheter and constant aspiration being applied at the hub of the balloon guide catheter with a 60 mL syringe, and with a Penumbra aspiration device at the hub of the 6 [place] Catalyst guide catheter for approximately 2-1/2 minutes, the combination of the retrieval device, the microcatheter and the 6 [place] Catalyst guide catheter were retrieved and removed. Aspiration at the balloon guide catheter in the right internal carotid artery was continued as proximal flow arrest was reversed. The aspirated contained multiple small bits of dark clots and also in the canister. A  control arteriogram performed through the balloon guide catheter in the right internal carotid artery demonstrated complete angiographic revascularization of the left anterior cerebral A1 A2 and A3 segments achieving a TICI 3 revascularization. Mild spasm of the right anterior cerebral A1 segment responded to 25 mcg of nitroglycerin intra-arterially. No evidence of intraluminal filling defects or of occlusions were seen in the right middle cerebral artery, right anterior cerebral artery and the left anterior cerebral artery distribution. Throughout the procedure, the patient's blood pressure and neurological status remained stable. No evidence of mass-effect or mass midline shift or of extravasation was seen. The balloon guide catheter was removed. The 8 French Pinnacle sheath was then left in situ on account of the patient being on IV tPA. Also, the patient was left intubated because of poor communication prior to the procedure and to protect her airway. A flat panel CT of the brain demonstrated no evidence of mass effect or midline shift or of hemorrhage.  The right groin appeared soft. The dorsalis pedis, and the posterior tibial arteries bilaterally demonstrated Dopplerable presence. The patient was then transferred to the neuro ICU intubated for post thrombectomy management. IMPRESSION: Status post endovascular complete revascularization of the left anterior cerebral A2 segment and distally with 1 pass with the 3 mm x 33 mm Trevo ProVue retrieval device, and Penumbra aspiration achieving a TICI 3 revascularization. PLAN: Follow up in the clinic 4 weeks post discharge. Electronically Signed   By: Luanne Bras M.D.   On: 07/27/2019 12:44   CT HEAD CODE STROKE WO CONTRAST  Result Date: 07/26/2019 CLINICAL DATA:  Code stroke.  Sudden onset weakness EXAM: CT HEAD WITHOUT CONTRAST TECHNIQUE: Contiguous axial images were obtained from the base of the skull through the vertex without intravenous contrast. COMPARISON:  04/06/2018 FINDINGS: Brain: No evidence of acute infarction, hemorrhage, hydrocephalus, extra-axial collection or mass lesion/mass effect. Vascular: No hyperdense vessel or unexpected calcification. Skull: Normal. Negative for fracture or focal lesion. Sinuses/Orbits: No acute finding. Other: Attempted call report. ASPECTS [facility] Stroke Program Early CT Score) Not scored without laterality. IMPRESSION: Negative head CT. Electronically Signed   By: Monte Fantasia M.D.   On: 07/26/2019 05:21   IR ANGIO INTRA EXTRACRAN SEL COM CAROTID INNOMINATE UNI L MOD SED  Result Date: 07/26/2019 INDICATION: Acute onset of right leg weakness and confusion. Occluded left anterior cerebral artery A2 segment and distally on CT angiogram of the head and neck.   EXAM: 1. EMERGENT LARGE VESSEL OCCLUSION THROMBOLYSIS anterior CIRCULATION)   COMPARISON:  CT angiogram of the head and neck of July 26, 2019.   MEDICATIONS: Vancomycin 1 g IV antibiotic was administered within 1 hour of the procedure.   ANESTHESIA/SEDATION: General anesthesia.   CONTRAST:  Isovue 300  approximately 90 cc.   FLUOROSCOPY TIME:  Fluoroscopy Time: 26 minutes 12 seconds (1676 mGy).   COMPLICATIONS: None immediate.   TECHNIQUE: Following a full explanation of the procedure along with the potential associated complications, an informed witnessed consent was obtained. The risks of intracranial hemorrhage of 10%, worsening neurological deficit, ventilator dependency, death and inability to revascularize were all reviewed in detail with the patient.   The patient was then put under general anesthesia by the Department of Anesthesiology at [facility].   The right groin was prepped and draped in the usual sterile fashion. Thereafter using modified Seldinger technique, transfemoral access into the right common femoral artery was obtained without difficulty. Over a 0.035 inch guidewire a 5 French Pinnacle sheath was inserted.  Through this, and also over a 0.035 inch guidewire a 5 [place] JB 1 catheter was advanced to the aortic arch region and selectively positioned in the innominate artery, the right common carotid artery, the left common carotid artery and the left subclavian artery.   FINDINGS: The left vertebral artery origin is widely patent.   The vessel is seen to opacify to the cranial skull base. Wide patency is seen of the left vertebrobasilar junction and the left posteroinferior cerebral artery.   The opacified portion of the basilar artery, the posterior cerebral artery, the superior cerebellar arteries and anterior-inferior cerebellar arteries is widely patent into the capillary and venous phases. Unopacified blood is seen in the basilar artery from the contralateral vertebral artery.   The left common carotid arteriogram demonstrates the left external carotid artery and its major branches to be widely patent.   The left internal carotid artery at the bulb to the cranial skull base demonstrates wide patency with mild tortuosity at the distal cervical segment.   More distally, the petrous,  cavernous and supraclinoid segments are widely patent.   The left middle cerebral artery is seen to opacify into the capillary and venous phases. No angiographic presence of the left anterior cerebral A1 segment is seen.   The innominate arteriogram demonstrates the right subclavian artery and the right common carotid artery origins to be widely patent.   The right vertebral artery origin demonstrates mild stenosis at its origin.   Otherwise, the vessel is seen to opacify to the cranial skull base. The right common carotid arteriogram reveals mild stenosis of the right external carotid origin. Its branches are seen opacified.   The right internal carotid artery at the bulb to the cranial skull base is widely patent with moderate O shaped tortuosity of the junction of the middle and the distal 1/3 segments. More distally, the petrous, cavernous and the supraclinoid segments widely patent.   The right middle cerebral artery and the right anterior cerebral artery opacify into the capillary and venous phases. Prompt cross filling via the anterior communicating artery of the right anterior cerebral artery A2 segment proximally is seen with evidence of a stump consistent with an occlusion.   PROCEDURE: The diagnostic JB 1 catheter in the right common carotid artery was then exchanged over a 0.035 inch 300 cm Rosen exchange guidewire for an 8 [place] Pinnacle sheath which was connected to continuous heparinized saline infusion. Over the exchange guidewire, an 087 Walrus balloon guide catheter which had been prepped with 50% contrast and 50% heparinized saline infusion was advanced and positioned in the proximal right internal carotid artery. The guidewire was removed. Good aspiration was obtained from the hub of the balloon guide catheter. A gentle control arteriogram performed through this demonstrated no change in the extracranial or intracranial circulations.   Over a 0.014 inch standard Synchro micro guidewire with a J  configuration, a combination of 150 cm Trevo ProVue microcatheter inside of a 6 French 132 cm Catalyst guide catheter was advanced without difficulty to the supraclinoid right ICA.   The micro guidewire was then gently manipulated with a torque device and advanced into the right anterior cerebral A1 segment followed by the advancement of the micro guidewire without difficulty through the anterior communicating artery into the left anterior cerebral artery distal A2 A3 segment followed by the microcatheter. The guidewire was removed. Good aspiration obtained from the hub of the microcatheter.   A gentle injection through the microcatheter demonstrated safe position of tip  of the microcatheter which was then connected to continuous heparinized saline infusion.   A 33 mm x 33 mm Trevo ProVue retrieval device was then advanced to the distal end of the microcatheter. The O ring on the delivery microcatheter was loosened. With slight forward gentle traction with the right hand on the delivery micro guidewire with the left hand the delivery microcatheter was retrieved unsheathing the distal and then the proximal portion of the retrieval device.   The Catalyst guide catheter was advanced to just proximal to the occluded segment of the left anterior cerebral A2 segment proximally. With proximal flow arrest in the right internal carotid artery by inflating the balloon of the [facility] guide catheter and constant aspiration being applied at the hub of the balloon guide catheter with a 60 mL syringe, and with a Penumbra aspiration device at the hub of the 6 [place] Catalyst guide catheter for approximately 2-1/2 minutes, the combination of the retrieval device, the microcatheter and the 6 [place] Catalyst guide catheter were retrieved and removed. Aspiration at the balloon guide catheter in the right internal carotid artery was continued as proximal flow arrest was reversed. The aspirated contained multiple small bits of dark clots  and also in the canister.   A control arteriogram performed through the balloon guide catheter in the right internal carotid artery demonstrated complete angiographic revascularization of the left anterior cerebral A1 A2 and A3 segments achieving a TICI 3 revascularization.   Mild spasm of the right anterior cerebral A1 segment responded to 25 mcg of nitroglycerin intra-arterially.   No evidence of intraluminal filling defects or of occlusions were seen in the right middle cerebral artery, right anterior cerebral artery and the left anterior cerebral artery distribution.   Throughout the procedure, the patient's blood pressure and neurological status remained stable. No evidence of mass-effect or mass midline shift or of extravasation was seen.   The balloon guide catheter was removed. The 8 French Pinnacle sheath was then left in situ on account of the patient being on IV tPA. Also, the patient was left intubated because of poor communication prior to the procedure and to protect her airway.   A flat panel CT of the brain demonstrated no evidence of mass effect or midline shift or of hemorrhage.   The right groin appeared soft. The dorsalis pedis, and the posterior tibial arteries bilaterally demonstrated Dopplerable presence.   The patient was then transferred to the neuro ICU intubated for post thrombectomy management.   IMPRESSION: Status post endovascular complete revascularization of the left anterior cerebral A2 segment and distally with 1 pass with the 3 mm x 33 mm Trevo ProVue retrieval device, and Penumbra aspiration achieving a TICI 3 revascularization.   PLAN: Follow up in the clinic 4 weeks post discharge.     Electronically Signed   By: Luanne Bras M.D.   On: 07/27/2019 12:44    IR ANGIO VERTEBRAL SEL SUBCLAVIAN INNOMINATE BILAT MOD SED  Result Date: 07/26/2019 INDICATION: Acute onset of right leg weakness and confusion. Occluded left anterior cerebral artery A2 segment and distally on CT  angiogram of the head and neck.   EXAM: 1. EMERGENT LARGE VESSEL OCCLUSION THROMBOLYSIS anterior CIRCULATION)   COMPARISON:  CT angiogram of the head and neck of July 26, 2019.   MEDICATIONS: Vancomycin 1 g IV antibiotic was administered within 1 hour of the procedure.   ANESTHESIA/SEDATION: General anesthesia.   CONTRAST:  Isovue 300 approximately 90 cc.   FLUOROSCOPY TIME:  Fluoroscopy Time:  26 minutes 12 seconds (1676 mGy).   COMPLICATIONS: None immediate.   TECHNIQUE: Following a full explanation of the procedure along with the potential associated complications, an informed witnessed consent was obtained. The risks of intracranial hemorrhage of 10%, worsening neurological deficit, ventilator dependency, death and inability to revascularize were all reviewed in detail with the patient.   The patient was then put under general anesthesia by the Department of Anesthesiology at [facility].   The right groin was prepped and draped in the usual sterile fashion. Thereafter using modified Seldinger technique, transfemoral access into the right common femoral artery was obtained without difficulty. Over a 0.035 inch guidewire a 5 French Pinnacle sheath was inserted. Through this, and also over a 0.035 inch guidewire a 5 [place] JB 1 catheter was advanced to the aortic arch region and selectively positioned in the innominate artery, the right common carotid artery, the left common carotid artery and the left subclavian artery.   FINDINGS: The left vertebral artery origin is widely patent.   The vessel is seen to opacify to the cranial skull base. Wide patency is seen of the left vertebrobasilar junction and the left posteroinferior cerebral artery.   The opacified portion of the basilar artery, the posterior cerebral artery, the superior cerebellar arteries and anterior-inferior cerebellar arteries is widely patent into the capillary and venous phases. Unopacified blood is seen in the basilar artery from the  contralateral vertebral artery.   The left common carotid arteriogram demonstrates the left external carotid artery and its major branches to be widely patent.   The left internal carotid artery at the bulb to the cranial skull base demonstrates wide patency with mild tortuosity at the distal cervical segment.   More distally, the petrous, cavernous and supraclinoid segments are widely patent.   The left middle cerebral artery is seen to opacify into the capillary and venous phases. No angiographic presence of the left anterior cerebral A1 segment is seen.   The innominate arteriogram demonstrates the right subclavian artery and the right common carotid artery origins to be widely patent.   The right vertebral artery origin demonstrates mild stenosis at its origin.   Otherwise, the vessel is seen to opacify to the cranial skull base. The right common carotid arteriogram reveals mild stenosis of the right external carotid origin. Its branches are seen opacified.   The right internal carotid artery at the bulb to the cranial skull base is widely patent with moderate O shaped tortuosity of the junction of the middle and the distal 1/3 segments. More distally, the petrous, cavernous and the supraclinoid segments widely patent.   The right middle cerebral artery and the right anterior cerebral artery opacify into the capillary and venous phases. Prompt cross filling via the anterior communicating artery of the right anterior cerebral artery A2 segment proximally is seen with evidence of a stump consistent with an occlusion.   PROCEDURE: The diagnostic JB 1 catheter in the right common carotid artery was then exchanged over a 0.035 inch 300 cm Rosen exchange guidewire for an 8 [place] Pinnacle sheath which was connected to continuous heparinized saline infusion. Over the exchange guidewire, an 087 Walrus balloon guide catheter which had been prepped with 50% contrast and 50% heparinized saline infusion was advanced and  positioned in the proximal right internal carotid artery. The guidewire was removed. Good aspiration was obtained from the hub of the balloon guide catheter. A gentle control arteriogram performed through this demonstrated no change in the extracranial or  intracranial circulations.   Over a 0.014 inch standard Synchro micro guidewire with a J configuration, a combination of 150 cm Trevo ProVue microcatheter inside of a 6 French 132 cm Catalyst guide catheter was advanced without difficulty to the supraclinoid right ICA.   The micro guidewire was then gently manipulated with a torque device and advanced into the right anterior cerebral A1 segment followed by the advancement of the micro guidewire without difficulty through the anterior communicating artery into the left anterior cerebral artery distal A2 A3 segment followed by the microcatheter. The guidewire was removed. Good aspiration obtained from the hub of the microcatheter.   A gentle injection through the microcatheter demonstrated safe position of tip of the microcatheter which was then connected to continuous heparinized saline infusion.   A 33 mm x 33 mm Trevo ProVue retrieval device was then advanced to the distal end of the microcatheter. The O ring on the delivery microcatheter was loosened. With slight forward gentle traction with the right hand on the delivery micro guidewire with the left hand the delivery microcatheter was retrieved unsheathing the distal and then the proximal portion of the retrieval device.   The Catalyst guide catheter was advanced to just proximal to the occluded segment of the left anterior cerebral A2 segment proximally. With proximal flow arrest in the right internal carotid artery by inflating the balloon of the [facility] guide catheter and constant aspiration being applied at the hub of the balloon guide catheter with a 60 mL syringe, and with a Penumbra aspiration device at the hub of the 6 [place] Catalyst guide catheter for  approximately 2-1/2 minutes, the combination of the retrieval device, the microcatheter and the 6 [place] Catalyst guide catheter were retrieved and removed. Aspiration at the balloon guide catheter in the right internal carotid artery was continued as proximal flow arrest was reversed. The aspirated contained multiple small bits of dark clots and also in the canister.   A control arteriogram performed through the balloon guide catheter in the right internal carotid artery demonstrated complete angiographic revascularization of the left anterior cerebral A1 A2 and A3 segments achieving a TICI 3 revascularization.   Mild spasm of the right anterior cerebral A1 segment responded to 25 mcg of nitroglycerin intra-arterially.   No evidence of intraluminal filling defects or of occlusions were seen in the right middle cerebral artery, right anterior cerebral artery and the left anterior cerebral artery distribution.   Throughout the procedure, the patient's blood pressure and neurological status remained stable. No evidence of mass-effect or mass midline shift or of extravasation was seen.   The balloon guide catheter was removed. The 8 French Pinnacle sheath was then left in situ on account of the patient being on IV tPA. Also, the patient was left intubated because of poor communication prior to the procedure and to protect her airway.   A flat panel CT of the brain demonstrated no evidence of mass effect or midline shift or of hemorrhage.   The right groin appeared soft. The dorsalis pedis, and the posterior tibial arteries bilaterally demonstrated Dopplerable presence.   The patient was then transferred to the neuro ICU intubated for post thrombectomy management.   IMPRESSION: Status post endovascular complete revascularization of the left anterior cerebral A2 segment and distally with 1 pass with the 3 mm x 33 mm Trevo ProVue retrieval device, and Penumbra aspiration achieving a TICI 3 revascularization.   PLAN:  Follow up in the clinic 4 weeks post discharge.  Electronically Signed   By: Luanne Bras M.D.   On: 07/27/2019 12:44    HISTORY OF PRESENT ILLNESS Kylie Tucker is an 69 y.o. female who initially presented to the [facility] after husband noted her to be weak on her right side shortly after 4:30 AM. LKW was 0430 on 07/26/2019 when she got up, felt hungry, wanted to go out to eat and started putting on her makeup. She was brought to the [facility] ED where Teleneurology consult was initiated. CT head was negative for hemorrhage. She was deemed to be a candidate for tPA, which was started at [facility] at 0600. CTA showed left A2 occlusion. The patient was emergently transported to [facility]. [facility] as a Code IR. Informed consent for endovascular procedure was obtained at [facility]. While still at [facility], the patient was lucid and able to provide informed consent with her husband present, over the telephone. Upon arrival to [place], she was confused with perseverative speech, but able to answer some orientation questions.   Repeat imaging:  CT head: Negative head CT  CTA head and neck with CTP: . Left A2 embolism with short segment occlusion. There is an associated 8 cc core infarct and 22 cc of penumbra. 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 Bilateral ICA looping in the neck. Minimal atheromatous changes. There is cardiomegaly and layering pleural effusions, question cardiac source.       HOSPITAL COURSE Kylie Tucker is a 69 y.o. female with history of HTN, HLD, migraines, breast cancer, CHF, dementia, GERD presenting to [facility] ED with R sided weakness. treated w/ tPA 1/14 at 0600. Found to have  L A2 occlusion and transferred to [facility]. [facility] for IR.    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  CTA head & neck L A2 embolism w/ short segment occlusion. Cardiomyopathy and layering pleural effusions. B ICA looping in neck. L A1 aplastic.  CT perfusion 8cc core, 22cc penumbra Pre IR CT following neuro changes without ICH post tPA  Cerebral angio L ACA A2 occlusion s/p TICI3 revascularization  CT head L frontal lobe ACA infarct  MRI - 07/28/19 - Evolving acute ischemic left ACA territory infarct. Associated minimal petechial hemorrhage without hemorrhagic transformation or mass effect. Additional scattered small volume subcentimeter cortical and subcortical ischemic infarcts involving both cerebral hemispheres. Underlying mild chronic microvascular ischemic disease. MRA - 07/28/19 - Interval revascularization of previously seen short-segment left A2 occlusion. No large vessel occlusion now evident. Mild atherosclerotic change elsewhere throughout the intracranial circulation. No other hemodynamically significant or correctable stenosis. 2D Echo EF 25-30%. No LV thrombus  LDL 100 HgbA1c 6.2 No antithrombotic prior to admission, now on plavix 75mg  daily (ASA allergy).  Considered loop. Given reducing low EF, EP feels likely pt will need biventricular ICD unless EF improves. Cardiology recommends wearing a cardiac monitor for the next several weeks, adjust her meds, repeat 2D in 2 mos - if EF remains 35% of less, get ICD. If EF improves, can consider loop at that time.  Therapy recommendations:  CIR -> pt prefers SNF closer to home Disposition:  SNF at [facility] test negative for d/c   Postop Acute Respiratory Failure, resolved Intubated  for IR Left intubated post IR as not fully responsive Extubated later in day without difficulties - Cardiomyopathy Acute on chronic combined systolic and diastolic congestive heart failure NICM w/ EF 25-30%, on home Lasix and metoprolol 03/2018 EF 40 to 45% Patient stated that  she follows with Dr. Einar Gip Continue metoprolol Low EF stable per cardiology Considered loop. Given reducing low EF, EP feels likely pt will need biventricular ICD unless EF improves. Cardiology recommends wearing a cardiac monitor for the next several weeks, adjust her meds, repeat 2D in 2 mos - if EF remains 35% of less, get ICD. If EF improves, can consider loop at that time.  Follow up with cardiology who plan enestro as OP   Hypertension Home meds:  toprol 50 Off cleviprex Stable  On norvasc 5 and home metoprolol BP goal now normotensive   Hyperlipidemia Home meds:  No statin  LDL 100, goal < 70 Intolerance to crestor. Will not do intensive statin d/t hx intolerance pravastatin 40 changed to zocor 10 1/17 by Dr. Leonie Man Continue statin at discharge   Other Stroke Risk Factors Advanced age Obesity, Body mass index is 33.49 kg/m., recommend weight loss, diet and exercise as appropriate  Migraines   Other Active Problems Hx breast cancer w/ R effusion, pretracheal adenopathy - warrants evaluation once stable AKI Cre 1.18->1.07->1.14->1.28->1.29->1.11 Hypokalemia 4.1->3.3->3.4 - supplement - 3.6  DISCHARGE EXAM Blood pressure 133/76, pulse 67, temperature 97.8 F (36.6 C), temperature source Oral, resp. rate 20, weight 83.1 kg, last menstrual period 01/10/2003, SpO2 96 %. General -frail elderly Caucasian lady, in no apparent distress.   Ophthalmologic - fundi not visualized due to noncooperation.   Cardiovascular - Regular rhythm and rate.   Mental Status -  Level of arousal and orientation to time, place, and person were intact. Language including expression, naming, repetition, comprehension was assessed and found intact. Fund of Knowledge was assessed and was diminished   Cranial Nerves II - XII - II - Visual field intact OU. III, IV, VI - Extraocular movements intact. V - Facial sensation intact bilaterally. VII - Facial movement intact bilaterally. VIII - Hearing  & vestibular intact bilaterally. X - Palate elevates symmetrically. XI - Chin turning & shoulder shrug intact bilaterally. XII - Tongue protrusion intact.   Motor Strength - The patient's strength was normal in all extremities except right leg weakness with right foot DF and PF 4/5 and right knee flexion 4/5 and mild right lower extremity pronator drift was present.  Bulk was normal and fasciculations were absent.   Motor Tone - Muscle tone was assessed at the neck and appendages and was normal.   Reflexes - The patient's reflexes were symmetrical in all extremities and she had no pathological reflexes.   Sensory - Light touch, temperature/pinprick were assessed and were symmetrical.     Coordination - The patient had normal movements in the hands with no ataxia or dysmetria.  Tremor was absent.   Gait and Station - deferred.   Discharge Diet   Heart healthy / carb modified thin liquids  DISCHARGE PLAN Disposition:  Skilled nursing facility for ongoing PT, OT and ST.  clopidogrel 75 mg daily for secondary stroke prevention  Ongoing stroke risk factor control by Primary Care Physician at time of discharge cardiac monitor to loop for afib as source of stroke: appt August 06, 2019 at 0930a for monitor placement at Dr. Irven Shelling office  Follow-up Dr. Einar Gip Aug 29, 2019 at 1000: med adjustments, repeat 2D in 2 mos -  if EF remains 35% of less, get ICD. If EF improves, can consider loop at that time.  Follow-up PCP [employer], MD in 2 weeks or MD at SNF Follow-up in [facility] in 4 weeks, office to schedule an appointment.   35 minutes were spent preparing discharge.  Burnetta Sabin, MSN, APRN, ANVP-BC, AGPCNP-BC Advanced Practice Stroke Nurse [place] for Schedule & Pager information 08/01/2019 12:16 PM   I have personally obtained history,examined this patient, reviewed notes, independently viewed imaging studies, participated  in medical decision making and plan of care.ROS completed by me personally and pertinent positives fully documented  I have made any additions or clarifications directly to the above note. Agree with note above.    Antony Contras, MD Medical Director [place] Pager: [phone] 08/01/2019 12:33 PM

## 2019-08-01 NOTE — Plan of Care (Signed)
  Problem: Health Behavior/Discharge Planning: Goal: Ability to manage health-related needs will improve 08/01/2019 1401 by Caroll Rancher, RN Outcome: Adequate for Discharge 08/01/2019 1401 by Caroll Rancher, RN Outcome: Progressing   Problem: Education: Goal: Knowledge of General Education information will improve Description: Including pain rating scale, medication(s)/side effects and non-pharmacologic comfort measures 08/01/2019 1401 by Caroll Rancher, RN Outcome: Adequate for Discharge 08/01/2019 1401 by Caroll Rancher, RN Outcome: Progressing   Problem: Clinical Measurements: Goal: Ability to maintain clinical measurements within normal limits will improve 08/01/2019 1401 by Caroll Rancher, RN Outcome: Adequate for Discharge 08/01/2019 1401 by Caroll Rancher, RN Outcome: Progressing

## 2019-08-01 NOTE — TOC Transition Note (Addendum)
Transition of Care (TOC) - CM/SW Discharge Note 08/01/19 - Discharged to [facility] via ambulance *Phone number for report - [phone] *Room number 144    Patient Details  Name: Kylie Tucker MRN: [id number] Date of Birth: [birth year]  Transition of Care [facility]) CM/SW Contact:  Sable Feil, LCSW Phone Number: 08/01/2019, 2:03 PM   Clinical Narrative:  Patient medically stable for discharge to [facility] for [place] rehab. Facility admissions director Tammy contacted and advised of patient's readiness for discharge and d/c clinicals transmitted to facility. Husband called and HIPPA compliant voice mail left regarding patient's discharge. Mrs. Winebarger will be transported by non-emergency ambulance transport.  Received insurance auth from [employer] for ambulance transport [phone].    Final next level of care: Skilled Nursing Facility Barriers to Discharge: No Barriers Identified   Patient Goals and CMS Choice   CMS Medicare.gov Compare Post Acute Care list provided to:: Other (Comment Required)(Patient provided facility preference) Choice offered to / list presented to : NA  Discharge Placement PASRR number recieved: 07/31/19            Patient chooses bed at: [facility] Patient to be transferred to facility by: Non emergency ambulance transport Name of family member notified: Jari Pigg contacted and HIPPA compliant VM left Patient and family notified of of transfer: 08/01/19  Discharge Plan and Services                                   Social Determinants of Health (SDOH) Interventions  No SDOH interventions needed or requested prior to discharge   Readmission Risk Interventions No flowsheet data found.

## 2019-08-01 NOTE — Plan of Care (Signed)
  Problem: Health Behavior/Discharge Planning: Goal: Ability to manage health-related needs will improve Outcome: Progressing   Problem: Education: Goal: Knowledge of General Education information will improve Description: Including pain rating scale, medication(s)/side effects and non-pharmacologic comfort measures Outcome: Progressing   

## 2019-08-01 NOTE — Progress Notes (Addendum)
Patient being discharged to [facility].  Patient to be transported by [facility].  IV removed with the catheter intact.  Discharge instructions and prescription information given to the patient who verbalized understanding and also placed in the patient packet for transport.  Report called to [place].

## 2019-08-01 NOTE — Progress Notes (Signed)
PTAR is here to pick pt to go to [facility]. Pt's alert and oriented. All belongings with pt.

## 2019-08-06 ENCOUNTER — Inpatient Hospital Stay: Payer: PPO

## 2019-08-06 DIAGNOSIS — I4891 Unspecified atrial fibrillation: Secondary | ICD-10-CM

## 2019-08-06 DIAGNOSIS — I480 Paroxysmal atrial fibrillation: Secondary | ICD-10-CM | POA: Diagnosis not present

## 2019-08-07 ENCOUNTER — Other Ambulatory Visit (HOSPITAL_COMMUNITY)
Admission: RE | Admit: 2019-08-07 | Discharge: 2019-08-07 | Disposition: A | Discharge status: Home / Self Care | Payer: PPO | Source: Ambulatory Visit | Attending: Adult Health | Admitting: Adult Health

## 2019-08-07 DIAGNOSIS — R7309 Other abnormal glucose: Secondary | ICD-10-CM | POA: Diagnosis not present

## 2019-08-07 DIAGNOSIS — I10 Essential (primary) hypertension: Secondary | ICD-10-CM | POA: Insufficient documentation

## 2019-08-07 DIAGNOSIS — E7849 Other hyperlipidemia: Secondary | ICD-10-CM | POA: Diagnosis not present

## 2019-08-07 LAB — BASIC METABOLIC PANEL
Anion gap: 9 (ref 5–15)
BUN: 19 mg/dL (ref 8–23)
CO2: 26 mmol/L (ref 22–32)
Calcium: 8.8 mg/dL — ABNORMAL LOW (ref 8.9–10.3)
Chloride: 104 mmol/L (ref 98–111)
Creatinine, Ser: 0.95 mg/dL (ref 0.44–1.00)
GFR calc Af Amer: >60 mL/min (ref >60)
GFR calc non Af Amer: >60 mL/min (ref >60)
Glucose, Bld: 88 mg/dL (ref 70–99)
Potassium: 3.6 mmol/L (ref 3.5–5.1)
Sodium: 139 mmol/L (ref 135–145)

## 2019-08-07 LAB — CBC
HCT: 41.8 % (ref 36.0–46.0)
Hemoglobin: 13.1 g/dL (ref 12.0–15.0)
MCH: 28.8 pg (ref 26.0–34.0)
MCHC: 31.3 g/dL (ref 30.0–36.0)
MCV: 91.9 fL (ref 80.0–100.0)
Platelets: 273 10*3/uL (ref 150–400)
RBC: 4.55 MIL/uL (ref 3.87–5.11)
RDW: 14.7 % (ref 11.5–15.5)
WBC: 7.2 10*3/uL (ref 4.0–10.5)
nRBC: 0 % (ref 0.0–0.2)

## 2019-08-07 LAB — LIPID PANEL
Cholesterol: 178 mg/dL (ref 0–200)
HDL: 33 mg/dL — ABNORMAL LOW (ref >40)
LDL Cholesterol: 126 mg/dL — ABNORMAL HIGH (ref 0–99)
Total CHOL/HDL Ratio: 5.4 RATIO
Triglycerides: 94 mg/dL (ref <150)
VLDL: 19 mg/dL (ref 0–40)

## 2019-08-08 ENCOUNTER — Other Ambulatory Visit: Payer: Self-pay

## 2019-08-08 ENCOUNTER — Non-Acute Institutional Stay (SKILLED_NURSING_FACILITY): Payer: PPO | Admitting: Internal Medicine

## 2019-08-08 ENCOUNTER — Encounter: Payer: Self-pay | Admitting: Internal Medicine

## 2019-08-08 DIAGNOSIS — I63529 Cerebral infarction due to unspecified occlusion or stenosis of unspecified anterior cerebral artery: Secondary | ICD-10-CM

## 2019-08-08 DIAGNOSIS — F419 Anxiety disorder, unspecified: Secondary | ICD-10-CM | POA: Diagnosis not present

## 2019-08-08 DIAGNOSIS — I10 Essential (primary) hypertension: Secondary | ICD-10-CM | POA: Diagnosis not present

## 2019-08-08 DIAGNOSIS — I63522 Cerebral infarction due to unspecified occlusion or stenosis of left anterior cerebral artery: Secondary | ICD-10-CM

## 2019-08-08 DIAGNOSIS — I5022 Chronic systolic (congestive) heart failure: Secondary | ICD-10-CM | POA: Diagnosis not present

## 2019-08-08 DIAGNOSIS — J449 Chronic obstructive pulmonary disease, unspecified: Secondary | ICD-10-CM | POA: Diagnosis not present

## 2019-08-08 DIAGNOSIS — J9601 Acute respiratory failure with hypoxia: Secondary | ICD-10-CM | POA: Diagnosis not present

## 2019-08-08 NOTE — Progress Notes (Signed)
: Provider:  Noah Delaine. Sheppard Coil, MD Location:  [place] Room Number: [id number] Place of Service:  SNF ([phone])  PCP: Redmond School, MD Patient Care Team: Redmond School, MD as PCP - General (Internal Medicine) Adrian Prows, MD as PCP - Cardiology (Cardiology) Magrinat, Virgie Dad, MD as Consulting Physician (Hematology and Oncology) Yisroel Ramming, Everardo All, MD as Consulting Physician (Obstetrics and Gynecology) Daryll Brod, MD as Consulting Physician (Orthopedic Surgery) Sypher, Herbie [place], MD (Inactive) as Consulting Physician (Orthopedic Surgery) Daneil Dolin, MD as Consulting Physician (Gastroenterology)  Extended Emergency Contact Information Primary Emergency Contact: [facility] Address: [address]          [place], [place] [address] Johnnette Litter of [place] Phone: [phone] Mobile Phone: [phone] Relation: Spouse Secondary Emergency Contact: [place] Phone: [phone] Mobile Phone: [phone] Relation: Brother     Allergies: Clonazepam, Hydrocodone-acetaminophen, Rosuvastatin, Trazodone and nefazodone, Acetaminophen, Amoxicillin-pot clavulanate, Cefuroxime axetil, Cephalexin, Levofloxacin, Morphine and related, Ondansetron, Propoxyphene n-acetaminophen, Sulfonamide derivatives, Vioxx [rofecoxib], Aspirin, Azithromycin, Clarithromycin, Codeine, Milk-related compounds, Seroquel [quetiapine fumarate], Sulfamethoxazole, and Tramadol  Chief Complaint  Patient presents with  . New Admit To SNF    Admit to Facility     HPI: Patient is 69 y.o. female with chronic kidney disease depression and diabetes who presented to [facility] ED shortly after her husband noted her to be weak on the right side about 4:30 AM.  Teleneurology was consulted and CT scan was negative for hemorrhage.  She was deemed to be a candidate for TPA which was started at 600.  CTA showed left M2 occlusion the patient was emergently transported to [facility]: [facility] with IR.  While still at 36.  The patient was lucid and able to provide informed consent with her husband present over the telephone.  Upon arrival to [facility] she was confused with preservation of speech.  Repeat imaging showed CTA of the head and neck left M2 embolism with a short segment occlusion it was not associated with 8 cc for infarct and a 22 cc of penumbra.  Left A1 is aphasic.  Bilateral ICA looping in the neck.  Patient underwent TPA, TSE's with reperfusion with revascularization.  Patient started on Plavix 75 mg daily she is allergic to allergy Hospital course was further complicated by postop acute respiratory failure for which she was briefly intubated.  Resolved.  Patient had acute on chronic combined systolic and diastolic heart failure was restarted on home Lasix and metoprolol EF was 40 to 45%.  Cardiology suggested patient wear a cardiac monitor for several weeks and this was placed several days after her arrival to skilled nursing facility.  Risk factors were assessed labs were improved and changed patient is not on a statin patient has intolerance to Crestor so she was started on Zocor 10 mg daily.  Patient is admitted to skilled nursing facility for OOPD treated with as needed albuterol and Robitussin-DM.T/PT.  While at skilled nursing facility patient will be followed for hypertension treated with Norvasc and metoprolol Lasix, anxiety treated with Valiium.    Past Medical History:  Diagnosis Date  . Allergy   . Anxiety   . Bilateral ovarian cysts    Do yearly ultrasound.  . Cancer ([place]) rt breast   2004/ chemo/ radiation  . Cellulitis and abscess of upper arm and forearm   . Chronic combined systolic and diastolic CHF (congestive heart failure) ([place])   . Dementia ([place])   . Family history of  breast cancer   . Family history of rectal cancer   . GERD (gastroesophageal reflux disease)   . H/O Helicobacter infection 2017  . History of right mastectomy 2004     chemo/ radiation  . Hyperlipidemia   . Hypertension   . Insomnia   . Left bundle branch block 2016  . Migraines   . Moderate mitral regurgitation   . NICM (nonischemic cardiomyopathy) ([place])   . Pneumonia   . PONV (postoperative nausea and vomiting)    cannot take zofran  . Postmastectomy lymphedema rt side  . Sentinel node     Past Surgical History:  Procedure Laterality Date  . BREAST EXCISIONAL BIOPSY Left    benign  . CARPAL TUNNEL RELEASE    . CATARACT EXTRACTION Left 07/12/2013  . IR ANGIO INTRA EXTRACRAN SEL COM CAROTID INNOMINATE UNI L MOD SED  07/26/2019  . IR ANGIO VERTEBRAL SEL SUBCLAVIAN INNOMINATE BILAT MOD SED  07/26/2019  . IR CT HEAD LTD  07/26/2019  . IR PERCUTANEOUS ART THROMBECTOMY/INFUSION INTRACRANIAL INC DIAG ANGIO  07/26/2019  . MANDIBLE SURGERY  1991  . MASTECTOMY Right    malignant  . NASAL SINUS SURGERY  1990/2001  . ORIF FINGER FRACTURE  02/14/2012   Procedure: OPEN REDUCTION INTERNAL FIXATION (ORIF) METACARPAL (FINGER) FRACTURE;  Surgeon: Wynonia Sours, MD;  Location: [place];  Service: Orthopedics;  Laterality: Right;  RIGHT FIFTH   . ORIF METACARPAL FRACTURE  8/13   rt   . ORIF PATELLA Right 11/07/2018   Procedure: OPEN REDUCTION INTERNAL (ORIF) FIXATION PATELLA;  Surgeon: Nicholes Stairs, MD;  Location: [facility];  Service: Orthopedics;  Laterality: Right;  . ORIF PATELLA FRACTURE Right 11/07/2018  . PORT-A-CATH REMOVAL     in and now out  . RADIOLOGY WITH ANESTHESIA N/A 07/26/2019   Procedure: RADIOLOGY WITH ANESTHESIA;  Surgeon: Luanne Bras, MD;  Location: [place];  Service: Radiology;  Laterality: N/A;  . REPAIR EXTENSOR TENDON Left 05/30/2013   Procedure: RELEASE TRANSPOSITION EXTENSOR POLLICUS LONGUS LEFT WRIST;  Surgeon: Wynonia Sours, MD;  Location: [place];  Service: Orthopedics;  Laterality: Left;  . rt mastectomy  2004   lymph nodes-7-axillary node dissection  . SHOULDER ARTHROSCOPY WITH ROTATOR CUFF  REPAIR AND SUBACROMIAL DECOMPRESSION Left 11/22/2013   Procedure: LEFT SHOULDER ARTHROSCOPY WITH SUBACROMIAL DECOMPRESSION DISTAL CLAVICLE RESECTION REPAIR  ROTATOR CUFF AS NEEDED ;  Surgeon: Cammie Sickle., MD;  Location: [facility];  Service: Orthopedics;  Laterality: Left;  . WRIST FRACTURE SURGERY      Allergies as of 08/08/2019      Reactions   Clonazepam Shortness Of Breath, Other (See Comments)   Heart had a "weird feeling" and difficulty breathing   Hydrocodone-acetaminophen Nausea And Vomiting   GI Upset (intolerance) Patient stated 4/28 that she is able to take Hydrocodone   Rosuvastatin Other (See Comments)   myalgia   Trazodone And Nefazodone Nausea Only, Other (See Comments)   Felt like "I was burning all over" -patient   Acetaminophen Nausea Only   Amoxicillin-pot Clavulanate Nausea Only   flushing Did it involve swelling of the face/tongue/throat, SOB, or low BP? No Did it involve sudden or severe rash/hives, skin peeling, or any reaction on the inside of your mouth or nose? No Did you need to seek medical attention at a hospital or doctor's office? Unknown When did it last happen?5 years If all above answers are "NO", may proceed with cephalosporin use.  Cefuroxime Axetil Nausea Only   Cephalexin Nausea Only   Levofloxacin Nausea Only   Morphine And Related Other (See Comments)   Hallucinations   Ondansetron Nausea And Vomiting   Propoxyphene N-acetaminophen Nausea And Vomiting   Sulfonamide Derivatives Itching   Vioxx [rofecoxib] Nausea And Vomiting   Aspirin Nausea And Vomiting   Gastrointestinal upset   Azithromycin Palpitations   Clarithromycin Nausea Only, Anxiety   Codeine Itching, Rash   Milk-related Compounds Nausea And Vomiting   Seroquel [quetiapine Fumarate] Palpitations   Heart racing   Sulfamethoxazole Rash   Tramadol Nausea And Vomiting      Medication List    Notice   This visit is during an admission. Changes to  the med list made in this visit will be reflected in the After Visit Summary of the admission.    Current Outpatient Medications on File Prior to Visit  Medication Sig Dispense Refill  . albuterol (VENTOLIN HFA) 108 (90 Base) MCG/ACT inhaler Inhale 2 puffs into the lungs every 6 (six) hours as needed for wheezing or shortness of breath. As needed for wheezing or SOB. Wait 5 minutes between inhalations of multiple inhalers.    Marland Kitchen amLODipine (NORVASC) 5 MG tablet Take 1 tablet (5 mg total) by mouth daily.    . clopidogrel (PLAVIX) 75 MG tablet Take 1 tablet (75 mg total) by mouth daily.    Marland Kitchen dextromethorphan-guaiFENesin (ROBITUSSIN-DM) 10-100 MG/5ML liquid Take 5 mLs by mouth every 4 (four) hours as needed for cough.    . diazepam (VALIUM) 10 MG tablet Take 1 tablet (10 mg total) by mouth at bedtime. 30 tablet 0  . furosemide (LASIX) 20 MG tablet Take 1 tablet (20 mg total) by mouth daily. 30 tablet   . metoprolol succinate (TOPROL-XL) 50 MG 24 hr tablet Take 50 mg by mouth at bedtime.     . Multiple Vitamin (MULTIVITAMIN WITH MINERALS) TABS tablet Take 1 tablet by mouth daily.    . NON FORMULARY Diet: _____ Regular,  ___x___ NAS,  _______Consistent Carbohydrate,  _______NPO  _____Other    . Nutritional Supplements (ENSURE CLEAR) LIQD Take 1 Container by mouth 2 (two) times daily between meals.    . simvastatin (ZOCOR) 20 MG tablet Take 1 tablet (20 mg total) by mouth daily at 6 PM. 30 tablet    No current facility-administered medications on file prior to visit.      No orders of the defined types were placed in this encounter.   Immunization History  Administered Date(s) Administered  . Tdap 07/12/2004    Social History   Tobacco Use  . Smoking status: Never Smoker  . Smokeless tobacco: Never Used  Substance Use Topics  . Alcohol use: No    Alcohol/week: 0.0 standard drinks    Comment: rarely    Family history is   Family History  Problem Relation Age of Onset  . Heart  disease Father   . Heart disease Mother   . Hypertension Mother   . Rectal cancer Mother        dx in her early 33s  . Colon polyps Mother   . Breast cancer Maternal Aunt        dx in her early 81s  . Heart disease Paternal Uncle   . Heart disease Paternal Grandmother   . Heart disease Paternal Grandfather   . Diabetes Brother   . Breast cancer Maternal Grandmother        dx in her 51s  . Breast cancer  Cousin        dx in early 1s  . Breast cancer Cousin 60  . Hepatitis C Son   . Hypertension Son   . Post-traumatic stress disorder Son       Review of Systems  GENERAL:  no fevers, fatigue, appetite changes SKIN: No itching, or rash EYES: No eye pain, redness, discharge EARS: No earache, tinnitus, change in hearing NOSE: No congestion, drainage or bleeding  MOUTH/THROAT: No mouth or tooth pain, No sore throat RESPIRATORY: No cough, wheezing, SOB CARDIAC: No chest pain, palpitations, lower extremity edema  GI: No abdominal pain, No N/V/D or constipation, No heartburn or reflux  GU: No dysuria, frequency or urgency, or incontinence  MUSCULOSKELETAL: No unrelieved bone/joint pain NEUROLOGIC: No headache, dizziness or focal weakness PSYCHIATRIC: No c/o anxiety or sadness   Vitals:   08/08/19 0920  BP: 137/81  Pulse: 79  Resp: 20  Temp: 97.8 F (36.6 C)    SpO2 Readings from Last 1 Encounters:  08/01/19 99%   Body mass index is 28.72 kg/m.     Physical Exam  GENERAL APPEARANCE: Alert, conversant,  No acute distress, controlling her hair and putting on her make-up while I am telling her how she escaped acute stroke with terrible deficits; "la belle indiffrence".  SKIN: No diaphoresis rash HEAD: Normocephalic, atraumatic  EYES: Conjunctiva/lids clear. Pupils round, reactive. EOMs intact.  EARS: External exam WNL, canals clear. Hearing grossly normal.  NOSE: No deformity or discharge.  MOUTH/THROAT: Lips w/o lesions  RESPIRATORY: Breathing is even, unlabored.  Lung sounds are clear   CARDIOVASCULAR: Heart RRR no murmurs, rubs or gallops. No peripheral edema.   GASTROINTESTINAL: Abdomen is soft, non-tender, not distended w/ normal bowel sounds. GENITOURINARY: Bladder non tender, not distended  MUSCULOSKELETAL: No abnormal joints or musculature NEUROLOGIC:  Cranial nerves 2-12 grossly intact. Moves all extremities  PSYCHIATRIC: Mood and affect not appropriate to situation, "Labelle" on"  Patient Active Problem List   Diagnosis Date Noted  . Stroke (cerebrum) ([place]) 07/26/2019  . Anterior cerebral circulation infarction, left ([place]), embolic, source unknown [id number]  . Displaced transverse fracture of right patella, initial encounter for closed fracture 11/07/2018  . Right patella fracture 11/07/2018  . Malignant neoplasm of lower-outer quadrant of right breast of female, estrogen receptor positive ([place]) 05/09/2018  . Elevated troponin 04/06/2018  . Hallucinations 04/06/2018  . Agitation 04/06/2018  . HTN (hypertension) 04/06/2018  . Bilateral ovarian cysts 05/20/2017  . Hyponatremia 11/28/2016  . N&V (nausea and vomiting) 11/28/2016  . Abdominal pain 11/28/2016  . Sleep talking 07/22/2016  . Genetic testing 06/01/2016  . Family history of breast cancer   . Family history of rectal cancer   . Chest pain 09/23/2015  . Abnormal vision 06/13/2015  . Anxiety disorder 06/13/2015  . Cataract cortical, senile 06/13/2015  . Clinical depression 06/13/2015  . Cephalalgia 06/13/2015  . H/O malignant neoplasm of breast 06/13/2015  . 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 Sinus infection 06/13/2015  . Long term current use of systemic steroids 06/13/2015  . Chronic systolic CHF (congestive heart failure), NYHA class 1 ([place]) 05/09/2015  . LBBB (left bundle branch block) 03/26/2015  . Lymphedema of arm 12/13/2013  . Complete rupture of rotator cuff 11/22/2013  . Nipple discharge in female 12/01/2012  . History of breast cancer 11/30/2012  . Epiphora  11/28/2012  . Disorder of endocrine system 04/25/2012  . Ceratitis 04/25/2012  . Blepharitis 04/25/2012  . Breast cancer, right breast ([place])   . FATIGUE 10/01/2009  .  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 Dyspnea 10/01/2009  . Hyperlipidemia 09/30/2009      Labs reviewed: Basic Metabolic Panel:    Component Value Date/Time   NA 139 08/07/2019 0800   NA 136 05/09/2017 1249   K 3.6 08/07/2019 0800   K 5.0 05/09/2017 1249   CL 104 08/07/2019 0800   CL 103 12/08/2012 0810   CO2 26 08/07/2019 0800   CO2 30 (H) 05/09/2017 1249   GLUCOSE 88 08/07/2019 0800   GLUCOSE 89 05/09/2017 1249   GLUCOSE 96 12/08/2012 0810   BUN 19 08/07/2019 0800   BUN 13.7 05/09/2017 1249   CREATININE 0.95 08/07/2019 0800   CREATININE 1.10 (H) 05/25/2019 1001   CREATININE 1.0 05/09/2017 1249   CALCIUM 8.8 (L) 08/07/2019 0800   CALCIUM 9.9 05/09/2017 1249   PROT 6.5 07/26/2019 0524   PROT 7.1 05/09/2017 1249   ALBUMIN 3.3 (L) 07/26/2019 0524   ALBUMIN 3.8 05/09/2017 1249   AST 34 07/26/2019 0524   AST 25 05/25/2019 1001   AST 16 05/09/2017 1249   ALT 30 07/26/2019 0524   ALT 40 05/25/2019 1001   ALT 13 05/09/2017 1249   ALKPHOS 64 07/26/2019 0524   ALKPHOS 92 05/09/2017 1249   BILITOT 0.9 07/26/2019 0524   BILITOT 0.3 05/25/2019 1001   BILITOT 0.35 05/09/2017 1249   GFRNONAA >60 08/07/2019 0800   GFRNONAA 52 (L) 05/25/2019 1001   GFRAA >60 08/07/2019 0800   GFRAA 60 (L) 05/25/2019 1001    Recent Labs    07/30/19 0224 07/31/19 0319 08/07/19 0800  NA 141 140 139  K 3.4* 3.6 3.6  CL 102 103 104  CO2 27 27 26   GLUCOSE 111* 98 88  BUN 20 14 19   CREATININE 1.29* 1.11* 0.95  CALCIUM 8.6* 8.4* 8.8*   Liver Function Tests: Recent Labs    05/25/19 1001 07/26/19 0524  AST 25 34  ALT 40 30  ALKPHOS 101 64  BILITOT 0.3 0.9  PROT 6.5 6.5  ALBUMIN 3.5 3.3*   No results for input(s): LIPASE, AMYLASE in the last 8760 hours. No results for input(s): AMMONIA in the last 8760 hours. CBC: Recent  Labs    12/12/18 0242 12/12/18 0242 05/25/19 1001 07/26/19 0524 07/27/19 0651 07/30/19 0224 07/31/19 0319 08/07/19 0800  WBC 7.4   < > 7.1 7.9   < > 8.9 7.2 7.2  NEUTROABS 4.9  --  3.8 5.5  --   --   --   --   HGB 14.0   < > 14.1 14.8   < > 14.4 13.7 13.1  HCT 43.2   < > 43.6 46.7*   < > 44.3 41.3 41.8  MCV 88.7   < > 89.3 93.0   < > 90.0 88.8 91.9  PLT 292   < > 254 273   < > 263 234 273   < > = values in this interval not displayed.   Lipid Recent Labs    07/27/19 0651 08/07/19 0800  CHOL 159 178  HDL 35* 33*  LDLCALC 100* 126*  TRIG 120 94    Cardiac Enzymes: No results for input(s): CKTOTAL, CKMB, CKMBINDEX, TROPONINI in the last 8760 hours. BNP: Recent Labs    07/29/19 0707  BNP 1,409.9*   No results found for: [facility] Lab Results  Component Value Date   HGBA1C 6.2 (H) 07/27/2019   Lab Results  Component Value Date   TSH 4.312 04/06/2018   No results found for: [id number]  No results found for: [facility] Lab Results  Component Value Date   IRON 69 04/01/2016   FERRITIN 86 04/01/2016    Imaging and Procedures obtained prior to SNF admission: No results found.   Not all labs, radiology exams or other studies done during hospitalization come through on my EPIC note; however they are reviewed by me.    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 status post TPA and IR with TSC I of the left A2 reperfusion, infarct embolic secondary to cardiomyopathy with low EF versus possible atrial fib; CT perfusion with 8 cc quarter and 22 cc penumbra; cerebral angio of left ACA A2 status post TICI 3 revascularization; on MRI additional scattered small volume subcentimeter cortical and subcortical ischemic infarcts involving both cerebral hemispheres underlying mild chronic microvascular ischemic disease; 2D echo EF 25 to 30% no LV thrombus, LDL 100, hemoglobin A1c 6.2; patient was put on Plavix 75 mg daily because she is allergic to ASA; will  need probably ICD but cardiology recommends cardiac monitoring the next several weeks and that was placed several days after arrival to SNF SNF-admitted for OT/PT patient has had cardiac monitor placed since arrival; continue Plavix 75 mg daily and Zocor 20 mg daily  Transient respiratory failure with hypoxia post procedure-patient was briefly intubated, resolved  Hypertension SNF-appropriate; continue Norvasc 5 mg daily and metoprolol XL 50 mg daily  COPD mild SNF-continue albuterol as needed and Robitussin-DM as needed  Anxiety SNF-continue Valium 10 mg nightly  Chronic  systolic congestive heart failure SNF-continue metoprolol succinate 50 mg XL nightly and Lasix 20 mg daily   Time spent way more than 45 minutes;> 50% of time with patient was spent reviewing records, labs, tests and studies, counseling and developing plan of care  Hennie Duos, MD

## 2019-08-11 DIAGNOSIS — J449 Chronic obstructive pulmonary disease, unspecified: Secondary | ICD-10-CM | POA: Insufficient documentation

## 2019-08-11 DIAGNOSIS — J9601 Acute respiratory failure with hypoxia: Secondary | ICD-10-CM | POA: Insufficient documentation

## 2019-08-12 DIAGNOSIS — F329 Major depressive disorder, single episode, unspecified: Secondary | ICD-10-CM | POA: Diagnosis not present

## 2019-08-12 DIAGNOSIS — M5030 Other cervical disc degeneration, unspecified cervical region: Secondary | ICD-10-CM | POA: Diagnosis not present

## 2019-08-12 DIAGNOSIS — C50511 Malignant neoplasm of lower-outer quadrant of right female breast: Secondary | ICD-10-CM | POA: Diagnosis not present

## 2019-08-12 DIAGNOSIS — Z170 Estrogen receptor positive status [ER+]: Secondary | ICD-10-CM | POA: Diagnosis not present

## 2019-08-15 ENCOUNTER — Encounter: Payer: Self-pay | Admitting: Adult Health

## 2019-08-15 ENCOUNTER — Non-Acute Institutional Stay (SKILLED_NURSING_FACILITY): Payer: PPO | Admitting: Adult Health

## 2019-08-15 DIAGNOSIS — J449 Chronic obstructive pulmonary disease, unspecified: Secondary | ICD-10-CM | POA: Diagnosis not present

## 2019-08-15 DIAGNOSIS — I5022 Chronic systolic (congestive) heart failure: Secondary | ICD-10-CM | POA: Diagnosis not present

## 2019-08-15 DIAGNOSIS — I63529 Cerebral infarction due to unspecified occlusion or stenosis of unspecified anterior cerebral artery: Secondary | ICD-10-CM

## 2019-08-15 NOTE — Progress Notes (Signed)
Location:    [facility] Room Number: [id number] Place of Service:  SNF (31) Phillips Grout NP    CODE STATUS: FULL CODE  Allergies  Allergen Reactions  . Clonazepam Shortness Of Breath and Other (See Comments)    Heart had a "weird feeling" and difficulty breathing  . Hydrocodone-Acetaminophen Nausea And Vomiting    GI Upset (intolerance) Patient stated 4/28 that she is able to take Hydrocodone  . Rosuvastatin Other (See Comments)    myalgia  . Trazodone And Nefazodone Nausea Only and Other (See Comments)    Felt like "I was burning all over" -patient  . Acetaminophen Nausea Only  . Amoxicillin-Pot Clavulanate Nausea Only    flushing Did it involve swelling of the face/tongue/throat, SOB, or low BP? No Did it involve sudden or severe rash/hives, skin peeling, or any reaction on the inside of your mouth or nose? No Did you need to seek medical attention at a hospital or doctor's office? Unknown When did it last happen?5 years If all above answers are "NO", may proceed with cephalosporin use.   . Cefuroxime Axetil Nausea Only  . Cephalexin Nausea Only  . Levofloxacin Nausea Only  . Morphine And Related Other (See Comments)    Hallucinations  . Ondansetron Nausea And Vomiting  . Propoxyphene N-Acetaminophen Nausea And Vomiting  . Sulfonamide Derivatives Itching  . Vioxx [Rofecoxib] Nausea And Vomiting  . Aspirin Nausea And Vomiting    Gastrointestinal upset  . Azithromycin Palpitations  . Clarithromycin Nausea Only and Anxiety  . Codeine Itching and Rash  . Milk-Related Compounds Nausea And Vomiting  . Seroquel [Quetiapine Fumarate] Palpitations    Heart racing   . Sulfamethoxazole Rash  . Tramadol Nausea And Vomiting    Chief Complaint  Patient presents with  . Acute Visit    Care Plan Meeting    HPI:  We have come together for her care plan meeting. BIMS 10/15 mood 0/30. Family is present. Her weight is stable; her appetite is  improving. No falls. Physically she is doing well; using walker. She has very poor safety awareness. She is incontinent most of the time. Her family is unable to provide the care she needs to go home. Her family is looking at assisted living for her. She continues to be followed for her chronic illnesses including: cva; chf'; copd.   Past Medical History:  Diagnosis Date  . Allergy   . Anxiety   . Bilateral ovarian cysts    Do yearly ultrasound.  . Cancer ([place]) rt breast   2004/ chemo/ radiation  . Cellulitis and abscess of upper arm and forearm   . Chronic combined systolic and diastolic CHF (congestive heart failure) ([place])   . Dementia ([place])   . Family history of breast cancer   . Family history of rectal cancer   . GERD (gastroesophageal reflux disease)   . H/O Helicobacter infection 2017  . History of right mastectomy 2004   chemo/ radiation  . Hyperlipidemia   . Hypertension   . Insomnia   . Left bundle branch block 2016  . Migraines   . Moderate mitral regurgitation   . NICM (nonischemic cardiomyopathy) ([place])   . Pneumonia   . PONV (postoperative nausea and vomiting)    cannot take zofran  . Postmastectomy lymphedema rt side  . Sentinel node     Past Surgical History:  Procedure Laterality Date  . BREAST EXCISIONAL BIOPSY Left    benign  . CARPAL TUNNEL  RELEASE    . CATARACT EXTRACTION Left 07/12/2013  . IR ANGIO INTRA EXTRACRAN SEL COM CAROTID INNOMINATE UNI L MOD SED  07/26/2019  . IR ANGIO VERTEBRAL SEL SUBCLAVIAN INNOMINATE BILAT MOD SED  07/26/2019  . IR CT HEAD LTD  07/26/2019  . IR PERCUTANEOUS ART THROMBECTOMY/INFUSION INTRACRANIAL INC DIAG ANGIO  07/26/2019  . MANDIBLE SURGERY  1991  . MASTECTOMY Right    malignant  . NASAL SINUS SURGERY  1990/2001  . ORIF FINGER FRACTURE  02/14/2012   Procedure: OPEN REDUCTION INTERNAL FIXATION (ORIF) METACARPAL (FINGER) FRACTURE;  Surgeon: Wynonia Sours, MD;  Location: [facility];  Service: Orthopedics;   Laterality: Right;  RIGHT FIFTH   . ORIF METACARPAL FRACTURE  8/13   rt   . ORIF PATELLA Right 11/07/2018   Procedure: OPEN REDUCTION INTERNAL (ORIF) FIXATION PATELLA;  Surgeon: Nicholes Stairs, MD;  Location: [facility];  Service: Orthopedics;  Laterality: Right;  . ORIF PATELLA FRACTURE Right 11/07/2018  . PORT-A-CATH REMOVAL     in and now out  . RADIOLOGY WITH ANESTHESIA N/A 07/26/2019   Procedure: RADIOLOGY WITH ANESTHESIA;  Surgeon: Luanne Bras, MD;  Location: [facility];  Service: Radiology;  Laterality: N/A;  . REPAIR EXTENSOR TENDON Left 05/30/2013   Procedure: RELEASE TRANSPOSITION EXTENSOR POLLICUS LONGUS LEFT WRIST;  Surgeon: Wynonia Sours, MD;  Location: [facility];  Service: Orthopedics;  Laterality: Left;  . rt mastectomy  2004   lymph nodes-7-axillary node dissection  . SHOULDER ARTHROSCOPY WITH ROTATOR CUFF REPAIR AND SUBACROMIAL DECOMPRESSION Left 11/22/2013   Procedure: LEFT SHOULDER ARTHROSCOPY WITH SUBACROMIAL DECOMPRESSION DISTAL CLAVICLE RESECTION REPAIR  ROTATOR CUFF AS NEEDED ;  Surgeon: Cammie Sickle., MD;  Location: [facility];  Service: Orthopedics;  Laterality: Left;  . WRIST FRACTURE SURGERY      Social History   Socioeconomic History  . Marital status: Married    Spouse name: Not on file  . Number of children: Not on file  . Years of education: Not on file  . Highest education level: Not on file  Occupational History  . Occupation: retired  Tobacco Use  . Smoking status: Never Smoker  . Smokeless tobacco: Never Used  Substance and Sexual Activity  . Alcohol use: No    Alcohol/week: 0.0 standard drinks    Comment: rarely  . Drug use: No  . Sexual activity: Not Currently    Partners: Male    Birth control/protection: Post-menopausal  Other Topics Concern  . Not on file  Social History Narrative  . Not on file   Social Determinants of Health   Financial Resource Strain:   . Difficulty of Paying Living  Expenses: Not on file  Food Insecurity:   . Worried About Charity fundraiser in the Last Year: Not on file  . Ran Out of Food in the Last Year: Not on file  Transportation Needs:   . Lack of Transportation (Medical): Not on file  . Lack of Transportation (Non-Medical): Not on file  Physical Activity:   . Days of Exercise per Week: Not on file  . Minutes of Exercise per Session: Not on file  Stress:   . Feeling of Stress : Not on file  Social Connections:   . Frequency of Communication with Friends and Family: Not on file  . Frequency of Social Gatherings with Friends and Family: Not on file  . Attends Religious Services: Not on file  . Active Member of Clubs  or Organizations: Not on file  . Attends Archivist Meetings: Not on file  . Marital Status: Not on file  Intimate Partner Violence:   . Fear of Current or Ex-Partner: Not on file  . Emotionally Abused: Not on file  . Physically Abused: Not on file  . Sexually Abused: Not on file   Family History  Problem Relation Age of Onset  . Heart disease Father   . Heart disease Mother   . Hypertension Mother   . Rectal cancer Mother        dx in her early 4s  . Colon polyps Mother   . Breast cancer Maternal Aunt        dx in her early 68s  . Heart disease Paternal Uncle   . Heart disease Paternal Grandmother   . Heart disease Paternal Grandfather   . Diabetes Brother   . Breast cancer Maternal Grandmother        dx in her 76s  . Breast cancer Cousin        dx in early 81s  . Breast cancer Cousin 64  . Hepatitis C Son   . Hypertension Son   . Post-traumatic stress disorder Son       VITAL SIGNS BP 122/68   Pulse 77   Temp (!) 97.4 F (36.3 C) (Oral)   Resp 20   Ht 5\' 2"  (1.575 m)   Wt 157 lb (71.2 kg)   LMP 01/10/2003   BMI 28.72 kg/m   Outpatient Encounter Medications as of 08/15/2019  Medication Sig  . albuterol (VENTOLIN HFA) 108 (90 Base) MCG/ACT inhaler Inhale 2 puffs into the lungs every 6  (six) hours as needed for wheezing or shortness of breath. As needed for wheezing or SOB. Wait 5 minutes between inhalations of multiple inhalers.  Marland Kitchen amLODipine (NORVASC) 5 MG tablet Take 1 tablet (5 mg total) by mouth daily.  . clopidogrel (PLAVIX) 75 MG tablet Take 1 tablet (75 mg total) by mouth daily.  Marland Kitchen dextromethorphan-guaiFENesin (ROBITUSSIN-DM) 10-100 MG/5ML liquid Take 5 mLs by mouth every 4 (four) hours as needed for cough.  . diazepam (VALIUM) 10 MG tablet Take 5 mg by mouth at bedtime.  . furosemide (LASIX) 20 MG tablet Take 1 tablet (20 mg total) by mouth daily.  . metoprolol succinate (TOPROL-XL) 50 MG 24 hr tablet Take 50 mg by mouth at bedtime.   . Multiple Vitamin (MULTIVITAMIN WITH MINERALS) TABS tablet Take 1 tablet by mouth daily.  . NON FORMULARY Diet: _____ Regular,  ___x___ NAS,  _______Consistent Carbohydrate,  _______NPO  _____Other  . Nutritional Supplements (ENSURE CLEAR) LIQD Take 1 Container by mouth 2 (two) times daily between meals.  . [DISCONTINUED] diazepam (VALIUM) 10 MG tablet Take 1 tablet (10 mg total) by mouth at bedtime.  . [DISCONTINUED] simvastatin (ZOCOR) 20 MG tablet Take 1 tablet (20 mg total) by mouth daily at 6 PM.   No facility-administered encounter medications on file as of 08/15/2019.     SIGNIFICANT DIAGNOSTIC EXAMS  TODAY;   07-26-19: 2-d echo:  Left Ventricle: Left ventricular ejection fraction, by visual estimation,  is 25 to 30%. The left ventricle has moderate to severely decreased  function. The left ventricle demonstrates global hypokinesis. Normal left  ventricular posterior wall thickness.   There is no left ventricular hypertrophy. Normal left atrial pressure.   07-26-19: ct angio of neck and head with ct perfusion:  1. Left A2 embolism with short segment occlusion. There is an associated  8 cc core infarct and 22 cc of penumbra. 2. Minimal atheromatous changes. There is however cardiomegaly and layering pleural effusions,  question cardiac source. 3. Bilateral ICA looping in the neck.  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  07-27-19: ct of head:  Acute left frontal lobe ACA territory ischemic infarct. No hemorrhagic conversion. No significant mass effect.  07-28-19: MRI/MRA of brain:  MRI HEAD IMPRESSION: 1. Evolving acute ischemic left ACA territory infarct. Associated minimal petechial hemorrhage without hemorrhagic transformation or mass effect. 2. Additional scattered small volume subcentimeter cortical and subcortical ischemic infarcts involving both cerebral hemispheres. 3. Underlying mild chronic microvascular ischemic disease.   MRA HEAD IMPRESSION: 1. Interval revascularization of previously seen short-segment left A2 occlusion. No large vessel occlusion now evident. 2. Mild atherosclerotic change elsewhere throughout the intracranial circulation. No other hemodynamically significant or correctable stenosis.   LABS REVIEWED TODAY  07-26-19: wbc7.9; hgb 14.8; hct 46.7; mcv 93.0 plt 273; glucose 132; bun 18; creat 1.18; k+ 3.7; na++ 143; ca 8.9; liver normal albumin 3.3 07-27-19: hgb a1c 6.2; chol 159; ldl 100; trig 120 hdl 35 07-29-19: wbc 7.7; hgb 12.6; hct 39.7 mcv 92. 5plt 225; glucose 112; bun 22; creat 1.28 ;k+ 3.3; na++ 140; ca 8.8; BNP 1409.9 08-07-19:wbc 7.2; hgb 13.1; hct 41.8; mcv 91.9 plt 273; glucose 88; bun 19; creat 0.95; k+ 3.6; an++ 139; ca 8.8   Review of Systems  Constitutional: Negative for malaise/fatigue.  Respiratory: Negative for cough and shortness of breath.   Cardiovascular: Negative for chest pain, palpitations and leg swelling.  Gastrointestinal: Negative for abdominal pain, constipation and heartburn.  Musculoskeletal: Negative for back pain, joint pain and myalgias.  Skin: Negative.   Neurological: Negative for dizziness.  Psychiatric/Behavioral: The patient is not nervous/anxious.     Physical Exam Constitutional:      General: She is not in acute distress.    Appearance: She  is well-developed. She is not diaphoretic.  Neck:     Thyroid: No thyromegaly.  Cardiovascular:     Rate and Rhythm: Normal rate and regular rhythm.     Pulses: Normal pulses.     Heart sounds: Normal heart sounds.     Comments: Event monitor on  Pulmonary:     Effort: Pulmonary effort is normal. No respiratory distress.     Breath sounds: Normal breath sounds.  Abdominal:     General: Bowel sounds are normal. There is no distension.     Palpations: Abdomen is soft.     Tenderness: There is no abdominal tenderness.  Musculoskeletal:        General: Normal range of motion.     Cervical back: Neck supple.     Right lower leg: No edema.     Left lower leg: No edema.  Lymphadenopathy:     Cervical: No cervical adenopathy.  Skin:    General: Skin is warm and dry.  Neurological:     Mental Status: She is alert. Mental status is at baseline.       ASSESSMENT/ PLAN:  TODAY   1. 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 Chronic systolic CHF( congestive heart failure)  3. COPD  Will continue therapy as directed Will continue current medications Will continue to monitor her status Her goal of care is for assisted living.     MD is aware of resident's narcotic use and  is in agreement with current plan of care. We will attempt to wean resident as appropriate.  Ok Edwards NP [facility]  Contact [phone] Monday through Friday 8am- 5pm  After hours call [phone]

## 2019-08-22 ENCOUNTER — Non-Acute Institutional Stay (SKILLED_NURSING_FACILITY): Payer: PPO | Admitting: Internal Medicine

## 2019-08-22 ENCOUNTER — Encounter: Payer: Self-pay | Admitting: Internal Medicine

## 2019-08-22 DIAGNOSIS — I10 Essential (primary) hypertension: Secondary | ICD-10-CM

## 2019-08-22 DIAGNOSIS — I63529 Cerebral infarction due to unspecified occlusion or stenosis of unspecified anterior cerebral artery: Secondary | ICD-10-CM

## 2019-08-22 DIAGNOSIS — J449 Chronic obstructive pulmonary disease, unspecified: Secondary | ICD-10-CM | POA: Diagnosis not present

## 2019-08-22 DIAGNOSIS — I5022 Chronic systolic (congestive) heart failure: Secondary | ICD-10-CM | POA: Diagnosis not present

## 2019-08-22 DIAGNOSIS — F419 Anxiety disorder, unspecified: Secondary | ICD-10-CM

## 2019-08-22 DIAGNOSIS — J9601 Acute respiratory failure with hypoxia: Secondary | ICD-10-CM | POA: Diagnosis not present

## 2019-08-22 MED ORDER — METOPROLOL SUCCINATE ER 50 MG PO TB24: 50.0000 mg | ORAL_TABLET | Freq: Every day | ORAL | 0 refills | Status: DC

## 2019-08-22 MED ORDER — AMLODIPINE BESYLATE 5 MG PO TABS: 5.0000 mg | ORAL_TABLET | Freq: Every day | ORAL | 0 refills | Status: DC

## 2019-08-22 MED ORDER — FUROSEMIDE 20 MG PO TABS: 20.0000 mg | ORAL_TABLET | Freq: Every day | ORAL | 0 refills | Status: AC

## 2019-08-22 MED ORDER — CLOPIDOGREL BISULFATE 75 MG PO TABS: 75.0000 mg | ORAL_TABLET | Freq: Every day | ORAL | 0 refills | Status: AC

## 2019-08-22 MED ORDER — ALBUTEROL SULFATE HFA 108 (90 BASE) MCG/ACT IN AERS: 2.0000 | INHALATION_SPRAY | Freq: Four times a day (QID) | RESPIRATORY_TRACT | 0 refills | Status: DC | PRN

## 2019-08-22 MED ORDER — DIAZEPAM 10 MG PO TABS: 5.0000 mg | ORAL_TABLET | Freq: Every day | ORAL | 0 refills | Status: AC

## 2019-08-22 NOTE — Progress Notes (Signed)
Location:  [facility] Room Number: 123-P Place of Service:  SNF [phone]) SNF Provider: Hennie Duos MD   PCP: Redmond School, MD Patient Care Team: Redmond School, MD as PCP - General (Internal Medicine) Adrian Prows, MD as PCP - Cardiology (Cardiology) Magrinat, Virgie Dad, MD as Consulting Physician (Hematology and Oncology) Yisroel Ramming, Everardo All, MD as Consulting Physician (Obstetrics and Gynecology) Daryll Brod, MD as Consulting Physician (Orthopedic Surgery) Sypher, Herbie [place], MD (Inactive) as Consulting Physician (Orthopedic Surgery) Gala Romney Cristopher Estimable, MD as Consulting Physician (Gastroenterology)  Extended Emergency Contact Information Primary Emergency Contact: [facility] Address: [address]          [place], [place] [address] Johnnette Litter of [place] Phone: [phone] Mobile Phone: [phone] Relation: Spouse Secondary Emergency Contact: [place] Phone: [phone] Mobile Phone: [phone] Relation: Brother  Allergies  Allergen Reactions  . Clonazepam Shortness Of Breath and Other (See Comments)    Heart had a "weird feeling" and difficulty breathing  . Hydrocodone-Acetaminophen Nausea And Vomiting    GI Upset (intolerance) Patient stated 4/28 that she is able to take Hydrocodone  . Rosuvastatin Other (See Comments)    myalgia  . Trazodone And Nefazodone Nausea Only and Other (See Comments)    Felt like "I was burning all over" -patient  . Acetaminophen Nausea Only  . Amoxicillin-Pot Clavulanate Nausea Only    flushing Did it involve swelling of the face/tongue/throat, SOB, or low BP? No Did it involve sudden or severe rash/hives, skin peeling, or any reaction on the inside of your mouth or nose? No Did you need to seek medical attention at a hospital or doctor's office? Unknown When did it last happen?5 years If all above answers are "NO", may proceed with cephalosporin use.   . Cefuroxime  Axetil Nausea Only  . Cephalexin Nausea Only  . Levofloxacin Nausea Only  . Morphine And Related Other (See Comments)    Hallucinations  . Ondansetron Nausea And Vomiting  . Propoxyphene N-Acetaminophen Nausea And Vomiting  . Sulfonamide Derivatives Itching  . Vioxx [Rofecoxib] Nausea And Vomiting  . Aspirin Nausea And Vomiting    Gastrointestinal upset  . Azithromycin Palpitations  . Clarithromycin Nausea Only and Anxiety  . Codeine Itching and Rash  . Milk-Related Compounds Nausea And Vomiting  . Seroquel [Quetiapine Fumarate] Palpitations    Heart racing   . Sulfamethoxazole Rash  . Tramadol Nausea And Vomiting    Chief Complaint  Patient presents with  . Discharge Note    Patient is seen for discharge home.    HPI:  69 y.o. female chronic kidney disease, depression, and diabetes who presented in ED shortly after has been anticoagulated on the right side at about 4:30 AM.  Teleneurology was consulted and CT scanning was negative for hemorrhage.  She was deemed to be a candidate for TPA which was started 06 100.  CTA showed left M2 occlusion.  The patient was emergently transported the hospital for IR.  Patient was lucid and able to provide informed consent with her husband present over the phone.  Upon arrival to [facility] she was confused with perseveration of speech.  Repeat imaging of CTA of the head and neck showed left M2 embolism with short segment occlusion which was associated with 8 cc of surrounding infarct and 22 cc penumbra.  Patient underwent TPA with reperfusion with revascularization.  Patient was started on Plavix 75 mg daily and it was noted that she was allergic to  aspirin.  Hospital course was further complicated by postop acute respiratory failure for which she was briefly intubated.  This resolved.  Patient had acute on chronic combined systolic and diastolic congestive heart failure which resolved and patient was restarted on home Lasix and metoprolol.   EF was 40 to 45%.  Cardiology suggested patient wear a cardiac monitor for several weeks and this was placed several days after her arrival to skilled nursing facility.  Risk factors were assessed, labs were improved.  Patient was not on a statin, it was noted she was intolerant to Crestor so she was started on Zocor 10 mg daily.  Patient was admitted to skilled nursing facility and is now ready to be discharged home.    Past Medical History:  Diagnosis Date  . Allergy   . Anxiety   . Bilateral ovarian cysts    Do yearly ultrasound.  . Cancer ([place]) rt breast   2004/ chemo/ radiation  . Cellulitis and abscess of upper arm and forearm   . Chronic combined systolic and diastolic CHF (congestive heart failure) ([place])   . Dementia ([place])   . Family history of breast cancer   . Family history of rectal cancer   . GERD (gastroesophageal reflux disease)   . H/O Helicobacter infection 2017  . History of right mastectomy 2004   chemo/ radiation  . Hyperlipidemia   . Hypertension   . Insomnia   . Left bundle branch block 2016  . Migraines   . Moderate mitral regurgitation   . NICM (nonischemic cardiomyopathy) ([place])   . Pneumonia   . PONV (postoperative nausea and vomiting)    cannot take zofran  . Postmastectomy lymphedema rt side  . Sentinel node     Past Surgical History:  Procedure Laterality Date  . BREAST EXCISIONAL BIOPSY Left    benign  . CARPAL TUNNEL RELEASE    . CATARACT EXTRACTION Left 07/12/2013  . IR ANGIO INTRA EXTRACRAN SEL COM CAROTID INNOMINATE UNI L MOD SED  07/26/2019  . IR ANGIO VERTEBRAL SEL SUBCLAVIAN INNOMINATE BILAT MOD SED  07/26/2019  . IR CT HEAD LTD  07/26/2019  . IR PERCUTANEOUS ART THROMBECTOMY/INFUSION INTRACRANIAL INC DIAG ANGIO  07/26/2019  . MANDIBLE SURGERY  1991  . MASTECTOMY Right    malignant  . NASAL SINUS SURGERY  1990/2001  . ORIF FINGER FRACTURE  02/14/2012   Procedure: OPEN REDUCTION INTERNAL FIXATION (ORIF) METACARPAL (FINGER) FRACTURE;   Surgeon: Wynonia Sours, MD;  Location: [facility];  Service: Orthopedics;  Laterality: Right;  RIGHT FIFTH   . ORIF METACARPAL FRACTURE  8/13   rt   . ORIF PATELLA Right 11/07/2018   Procedure: OPEN REDUCTION INTERNAL (ORIF) FIXATION PATELLA;  Surgeon: Nicholes Stairs, MD;  Location: [facility];  Service: Orthopedics;  Laterality: Right;  . ORIF PATELLA FRACTURE Right 11/07/2018  . PORT-A-CATH REMOVAL     in and now out  . RADIOLOGY WITH ANESTHESIA N/A 07/26/2019   Procedure: RADIOLOGY WITH ANESTHESIA;  Surgeon: Luanne Bras, MD;  Location: [facility];  Service: Radiology;  Laterality: N/A;  . REPAIR EXTENSOR TENDON Left 05/30/2013   Procedure: RELEASE TRANSPOSITION EXTENSOR POLLICUS LONGUS LEFT WRIST;  Surgeon: Wynonia Sours, MD;  Location: [facility];  Service: Orthopedics;  Laterality: Left;  . rt mastectomy  2004   lymph nodes-7-axillary node dissection  . SHOULDER ARTHROSCOPY WITH ROTATOR CUFF REPAIR AND SUBACROMIAL DECOMPRESSION Left 11/22/2013   Procedure: LEFT SHOULDER ARTHROSCOPY WITH SUBACROMIAL DECOMPRESSION DISTAL CLAVICLE  RESECTION REPAIR  ROTATOR CUFF AS NEEDED ;  Surgeon: Cammie Sickle., MD;  Location: [facility];  Service: Orthopedics;  Laterality: Left;  . WRIST FRACTURE SURGERY       reports that she has never smoked. She has never used smokeless tobacco. She reports that she does not drink alcohol or use drugs. Social History   Socioeconomic History  . Marital status: Married    Spouse name: Not on file  . Number of children: 1  . Years of education: Not on file  . Highest education level: Not on file  Occupational History  . Occupation: retired  Tobacco Use  . Smoking status: Never Smoker  . Smokeless tobacco: Never Used  Substance and Sexual Activity  . Alcohol use: No    Alcohol/week: 0.0 standard drinks    Comment: rarely  . Drug use: No  . Sexual activity: Not Currently    Partners: Male    Birth  control/protection: Post-menopausal  Other Topics Concern  . Not on file  Social History Narrative  . Not on file   Social Determinants of Health   Financial Resource Strain:   . Difficulty of Paying Living Expenses: Not on file  Food Insecurity:   . Worried About Charity fundraiser in the Last Year: Not on file  . Ran Out of Food in the Last Year: Not on file  Transportation Needs:   . Lack of Transportation (Medical): Not on file  . Lack of Transportation (Non-Medical): Not on file  Physical Activity:   . Days of Exercise per Week: Not on file  . Minutes of Exercise per Session: Not on file  Stress:   . Feeling of Stress : Not on file  Social Connections:   . Frequency of Communication with Friends and Family: Not on file  . Frequency of Social Gatherings with Friends and Family: Not on file  . Attends Religious Services: Not on file  . Active Member of Clubs or Organizations: Not on file  . Attends Archivist Meetings: Not on file  . Marital Status: Not on file  Intimate Partner Violence:   . Fear of Current or Ex-Partner: Not on file  . Emotionally Abused: Not on file  . Physically Abused: Not on file  . Sexually Abused: Not on file    Pertinent  Health Maintenance Due  Topic Date Due  . COLONOSCOPY  07/04/2007  . PNA vac Low Risk Adult (1 of 2 - PCV13) 01/28/2016  . INFLUENZA VACCINE  02/10/2019  . MAMMOGRAM  06/02/2019  . DEXA SCAN  Completed    Medications: Allergies as of 08/22/2019      Reactions   Clonazepam Shortness Of Breath, Other (See Comments)   Heart had a "weird feeling" and difficulty breathing   Hydrocodone-acetaminophen Nausea And Vomiting   GI Upset (intolerance) Patient stated 4/28 that she is able to take Hydrocodone   Rosuvastatin Other (See Comments)   myalgia   Trazodone And Nefazodone Nausea Only, Other (See Comments)   Felt like "I was burning all over" -patient   Acetaminophen Nausea Only   Amoxicillin-pot Clavulanate  Nausea Only   flushing Did it involve swelling of the face/tongue/throat, SOB, or low BP? No Did it involve sudden or severe rash/hives, skin peeling, or any reaction on the inside of your mouth or nose? No Did you need to seek medical attention at a hospital or doctor's office? Unknown When did it last happen?5 years If all  above answers are "NO", may proceed with cephalosporin use.   Cefuroxime Axetil Nausea Only   Cephalexin Nausea Only   Levofloxacin Nausea Only   Morphine And Related Other (See Comments)   Hallucinations   Ondansetron Nausea And Vomiting   Propoxyphene N-acetaminophen Nausea And Vomiting   Sulfonamide Derivatives Itching   Vioxx [rofecoxib] Nausea And Vomiting   Aspirin Nausea And Vomiting   Gastrointestinal upset   Azithromycin Palpitations   Clarithromycin Nausea Only, Anxiety   Codeine Itching, Rash   Milk-related Compounds Nausea And Vomiting   Seroquel [quetiapine Fumarate] Palpitations   Heart racing   Sulfamethoxazole Rash   Tramadol Nausea And Vomiting      Medication List       Accurate as of August 22, 2019 11:59 PM. If you have any questions, ask your nurse or doctor.        albuterol 108 (90 Base) MCG/ACT inhaler Commonly known as: VENTOLIN HFA Inhale 2 puffs into the lungs every 6 (six) hours as needed for wheezing or shortness of breath. As needed for wheezing or SOB. Wait 5 minutes between inhalations of multiple inhalers.   amLODipine 5 MG tablet Commonly known as: NORVASC Take 1 tablet (5 mg total) by mouth daily.   clopidogrel 75 MG tablet Commonly known as: PLAVIX Take 1 tablet (75 mg total) by mouth daily.   dextromethorphan-guaiFENesin 10-100 MG/5ML liquid Commonly known as: ROBITUSSIN-DM Take 5 mLs by mouth every 4 (four) hours as needed for cough.   diazepam 10 MG tablet Commonly known as: VALIUM Take 0.5 tablets (5 mg total) by mouth at bedtime.   Ensure Clear Liqd Take 1 Container by mouth 2 (two) times  daily between meals.   furosemide 20 MG tablet Commonly known as: LASIX Take 1 tablet (20 mg total) by mouth daily.   metoprolol succinate 50 MG 24 hr tablet Commonly known as: TOPROL-XL Take 1 tablet (50 mg total) by mouth at bedtime.   multivitamin with minerals Tabs tablet Take 1 tablet by mouth daily.   NON FORMULARY Diet: _____ Regular,  ___x___ NAS,  _______Consistent Carbohydrate,  _______NPO  _____Other        Vitals:   08/22/19 1131  BP: 116/64  Pulse: 68  Resp: 16  Temp: (!) 97.2 F (36.2 C)  TempSrc: Oral  Weight: 155 lb 14.4 oz (70.7 kg)  Height: 5\' 2"  (1.575 m)   Body mass index is 28.51 kg/m.  Physical Exam  GENERAL APPEARANCE: Alert, conversant. No acute distress.  HEENT: Unremarkable. RESPIRATORY: Breathing is even, unlabored. Lung sounds are clear   CARDIOVASCULAR: Heart RRR no murmurs, rubs or gallops. No peripheral edema.  GASTROINTESTINAL: Abdomen is soft, non-tender, not distended w/ normal bowel sounds.  NEUROLOGIC: Cranial nerves 2-12 grossly intact. Moves all extremities   Labs reviewed: Basic Metabolic Panel: Recent Labs    07/30/19 0224 07/31/19 0319 08/07/19 0800  NA 141 140 139  K 3.4* 3.6 3.6  CL 102 103 104  CO2 27 27 26   GLUCOSE 111* 98 88  BUN 20 14 19   CREATININE 1.29* 1.11* 0.95  CALCIUM 8.6* 8.4* 8.8*   No results found for: [facility] Liver Function Tests: Recent Labs    05/25/19 1001 07/26/19 0524  AST 25 34  ALT 40 30  ALKPHOS 101 64  BILITOT 0.3 0.9  PROT 6.5 6.5  ALBUMIN 3.5 3.3*   No results for input(s): LIPASE, AMYLASE in the last 8760 hours. No results for input(s): AMMONIA in the last 8760 hours. CBC:  Recent Labs    12/12/18 0242 12/12/18 0242 05/25/19 1001 07/26/19 0524 07/27/19 0651 07/30/19 0224 07/31/19 0319 08/07/19 0800  WBC 7.4   < > 7.1 7.9   < > 8.9 7.2 7.2  NEUTROABS 4.9  --  3.8 5.5  --   --   --   --   HGB 14.0   < > 14.1 14.8   < > 14.4 13.7 13.1  HCT 43.2   < > 43.6  46.7*   < > 44.3 41.3 41.8  MCV 88.7   < > 89.3 93.0   < > 90.0 88.8 91.9  PLT 292   < > 254 273   < > 263 234 273   < > = values in this interval not displayed.   Lipid Recent Labs    07/27/19 0651 08/07/19 0800  CHOL 159 178  HDL 35* 33*  LDLCALC 100* 126*  TRIG 120 94   Cardiac Enzymes: No results for input(s): CKTOTAL, CKMB, CKMBINDEX, TROPONINI in the last 8760 hours. BNP: Recent Labs    07/29/19 0707  BNP 1,409.9*   CBG: Recent Labs    08/01/19 1130 08/01/19 1730 08/01/19 2106  GLUCAP 133* 117* 173*    Procedures and Imaging Studies During Stay: MR MRA HEAD WO CONTRAST  Result Date: 07/28/2019 CLINICAL DATA:  Follow-up examination for acute stroke. EXAM: MRI HEAD WITHOUT CONTRAST MRA HEAD WITHOUT CONTRAST TECHNIQUE: Multiplanar, multiecho pulse sequences of the brain and surrounding structures were obtained without intravenous contrast. Angiographic images of the head were obtained using MRA technique without contrast. COMPARISON:  Prior studies from 07/26/2019. FINDINGS: MRI HEAD FINDINGS Brain: Cerebral volume within normal limits for age. Mild chronic microvascular ischemic changes present within the periventricular deep white matter both cerebral hemispheres. Restricted diffusion involving the parasagittal anterior left frontal lobe compatible with acute left ACA territory infarct. Associated minimal petechial hemorrhage without hemorrhagic transformation or significant mass effect. Multiple additional scattered subcentimeter acute ischemic infarcts seen involving the cortical and subcortical aspect of both cerebral hemispheres. For reference purposes, the largest of these foci measures 6 mm at the right occipital lobe. No associated hemorrhage or mass effect. Gray-white matter differentiation otherwise maintained. No other areas of remote cortical infarction. No other evidence for acute or chronic intracranial hemorrhage. No mass lesion, midline shift or mass effect. No  hydrocephalus. No extra-axial fluid collection. Pituitary gland suprasellar region normal. Midline structures intact. Vascular: Major intracranial vascular flow voids are maintained. Skull and upper cervical spine: Craniocervical junction within normal limits. Bone marrow signal intensity normal. No scalp soft tissue abnormality. Sinuses/Orbits: Patient status post bilateral ocular lens replacement. Paranasal sinuses are largely clear. No significant mastoid effusion. Inner ear structures grossly normal. Other: None. MRA HEAD FINDINGS ANTERIOR CIRCULATION: Tortuosity of the distal cervical ICAs noted bilaterally. Internal carotid arteries widely patent to the termini without stenosis. Widely patent right A1. Absent left A1, accounting for the diminutive left ICA is compared to the right. Normal anterior communicating artery. There has been interval revascularization of previously seen short-segment left A2 occlusion, with the visualized ACAs now patent to their distal aspects. M1 segments widely patent. Normal MCA bifurcations. Distal MCA branches well perfused and symmetric. POSTERIOR CIRCULATION: Vertebral arteries widely patent to the vertebrobasilar junction without stenosis. Dominant left vertebral artery. Posterior inferior cerebral arteries patent bilaterally. Basilar patent to its distal aspect without stenosis. Superior cerebral arteries patent bilaterally. Both PCAs primarily supplied via the basilar and are well perfused to their distal aspects. No intracranial  aneurysm. IMPRESSION: MRI HEAD IMPRESSION: 1. Evolving acute ischemic left ACA territory infarct. Associated minimal petechial hemorrhage without hemorrhagic transformation or mass effect. 2. Additional scattered small volume subcentimeter cortical and subcortical ischemic infarcts involving both cerebral hemispheres. 3. Underlying mild chronic microvascular ischemic disease. MRA HEAD IMPRESSION: 1. Interval revascularization of previously seen  short-segment left A2 occlusion. No large vessel occlusion now evident. 2. Mild atherosclerotic change elsewhere throughout the intracranial circulation. No other hemodynamically significant or correctable stenosis. Electronically Signed   By: Jeannine Boga M.D.   On: 07/28/2019 05:23   MR BRAIN WO CONTRAST  Result Date: 07/28/2019 CLINICAL DATA:  Follow-up examination for acute stroke. EXAM: MRI HEAD WITHOUT CONTRAST MRA HEAD WITHOUT CONTRAST TECHNIQUE: Multiplanar, multiecho pulse sequences of the brain and surrounding structures were obtained without intravenous contrast. Angiographic images of the head were obtained using MRA technique without contrast. COMPARISON:  Prior studies from 07/26/2019. FINDINGS: MRI HEAD FINDINGS Brain: Cerebral volume within normal limits for age. Mild chronic microvascular ischemic changes present within the periventricular deep white matter both cerebral hemispheres. Restricted diffusion involving the parasagittal anterior left frontal lobe compatible with acute left ACA territory infarct. Associated minimal petechial hemorrhage without hemorrhagic transformation or significant mass effect. Multiple additional scattered subcentimeter acute ischemic infarcts seen involving the cortical and subcortical aspect of both cerebral hemispheres. For reference purposes, the largest of these foci measures 6 mm at the right occipital lobe. No associated hemorrhage or mass effect. Gray-white matter differentiation otherwise maintained. No other areas of remote cortical infarction. No other evidence for acute or chronic intracranial hemorrhage. No mass lesion, midline shift or mass effect. No hydrocephalus. No extra-axial fluid collection. Pituitary gland suprasellar region normal. Midline structures intact. Vascular: Major intracranial vascular flow voids are maintained. Skull and upper cervical spine: Craniocervical junction within normal limits. Bone marrow signal intensity normal.  No scalp soft tissue abnormality. Sinuses/Orbits: Patient status post bilateral ocular lens replacement. Paranasal sinuses are largely clear. No significant mastoid effusion. Inner ear structures grossly normal. Other: None. MRA HEAD FINDINGS ANTERIOR CIRCULATION: Tortuosity of the distal cervical ICAs noted bilaterally. Internal carotid arteries widely patent to the termini without stenosis. Widely patent right A1. Absent left A1, accounting for the diminutive left ICA is compared to the right. Normal anterior communicating artery. There has been interval revascularization of previously seen short-segment left A2 occlusion, with the visualized ACAs now patent to their distal aspects. M1 segments widely patent. Normal MCA bifurcations. Distal MCA branches well perfused and symmetric. POSTERIOR CIRCULATION: Vertebral arteries widely patent to the vertebrobasilar junction without stenosis. Dominant left vertebral artery. Posterior inferior cerebral arteries patent bilaterally. Basilar patent to its distal aspect without stenosis. Superior cerebral arteries patent bilaterally. Both PCAs primarily supplied via the basilar and are well perfused to their distal aspects. No intracranial aneurysm. IMPRESSION: MRI HEAD IMPRESSION: 1. Evolving acute ischemic left ACA territory infarct. Associated minimal petechial hemorrhage without hemorrhagic transformation or mass effect. 2. Additional scattered small volume subcentimeter cortical and subcortical ischemic infarcts involving both cerebral hemispheres. 3. Underlying mild chronic microvascular ischemic disease. MRA HEAD IMPRESSION: 1. Interval revascularization of previously seen short-segment left A2 occlusion. No large vessel occlusion now evident. 2. Mild atherosclerotic change elsewhere throughout the intracranial circulation. No other hemodynamically significant or correctable stenosis. Electronically Signed   By: Jeannine Boga M.D.   On: 07/28/2019 05:23     Assessment/Plan:   Acute ischemic cerebrovascular accident (CVA) involving anterior cerebral artery territory [facility])  Acute respiratory failure with hypoxia ([place])  Essential  hypertension  COPD not affecting current episode of care [facility])  Anxiety disorder, unspecified type  Chronic systolic CHF (congestive heart failure), NYHA class 1 ([place])   Patient is being discharged with the following home health services:  OT/PT  Patient is being discharged with the following durable medical equipment: none   Patient has been advised to f/u with their PCP in 1-2 weeks to bring them up to date on their rehab stay.  Social services at facility was responsible for arranging this appointment.  Pt was provided with a 30 day supply of prescriptions for medications and refills must be obtained from their PCP.  For controlled substances, a more limited supply may be provided adequate until PCP appointment only.  Medications were reconciled by me.  Time spent greater than 35 minutes;> 50% of time with patient was spent reviewing records, labs, tests and studies, counseling and developing plan of care  Hennie Duos MD

## 2019-08-23 DIAGNOSIS — N83202 Unspecified ovarian cyst, left side: Secondary | ICD-10-CM | POA: Diagnosis not present

## 2019-08-23 DIAGNOSIS — I5042 Chronic combined systolic (congestive) and diastolic (congestive) heart failure: Secondary | ICD-10-CM | POA: Diagnosis not present

## 2019-08-23 DIAGNOSIS — F0390 Unspecified dementia without behavioral disturbance: Secondary | ICD-10-CM | POA: Diagnosis not present

## 2019-08-23 DIAGNOSIS — I130 Hypertensive heart and chronic kidney disease with heart failure and stage 1 through stage 4 chronic kidney disease, or unspecified chronic kidney disease: Secondary | ICD-10-CM | POA: Diagnosis not present

## 2019-08-23 DIAGNOSIS — I428 Other cardiomyopathies: Secondary | ICD-10-CM | POA: Diagnosis not present

## 2019-08-23 DIAGNOSIS — I69351 Hemiplegia and hemiparesis following cerebral infarction affecting right dominant side: Secondary | ICD-10-CM | POA: Diagnosis not present

## 2019-08-23 DIAGNOSIS — N189 Chronic kidney disease, unspecified: Secondary | ICD-10-CM | POA: Diagnosis not present

## 2019-08-23 DIAGNOSIS — J449 Chronic obstructive pulmonary disease, unspecified: Secondary | ICD-10-CM | POA: Diagnosis not present

## 2019-08-23 DIAGNOSIS — N83201 Unspecified ovarian cyst, right side: Secondary | ICD-10-CM | POA: Diagnosis not present

## 2019-08-23 DIAGNOSIS — E1122 Type 2 diabetes mellitus with diabetic chronic kidney disease: Secondary | ICD-10-CM | POA: Diagnosis not present

## 2019-08-23 DIAGNOSIS — I447 Left bundle-branch block, unspecified: Secondary | ICD-10-CM | POA: Diagnosis not present

## 2019-08-23 DIAGNOSIS — R32 Unspecified urinary incontinence: Secondary | ICD-10-CM | POA: Diagnosis not present

## 2019-08-23 DIAGNOSIS — I0981 Rheumatic heart failure: Secondary | ICD-10-CM | POA: Diagnosis not present

## 2019-08-24 ENCOUNTER — Other Ambulatory Visit: Payer: Self-pay

## 2019-08-24 ENCOUNTER — Encounter: Payer: Self-pay | Admitting: Cardiology

## 2019-08-24 ENCOUNTER — Ambulatory Visit: Payer: PPO | Admitting: Cardiology

## 2019-08-24 VITALS — BP 124/61 | HR 77 | Temp 97.3°F | Ht 62.0 in | Wt 154.1 lb

## 2019-08-24 DIAGNOSIS — I63529 Cerebral infarction due to unspecified occlusion or stenosis of unspecified anterior cerebral artery: Secondary | ICD-10-CM

## 2019-08-24 DIAGNOSIS — I5022 Chronic systolic (congestive) heart failure: Secondary | ICD-10-CM | POA: Diagnosis not present

## 2019-08-24 DIAGNOSIS — N1831 Chronic kidney disease, stage 3a: Secondary | ICD-10-CM

## 2019-08-24 DIAGNOSIS — I447 Left bundle-branch block, unspecified: Secondary | ICD-10-CM

## 2019-08-24 MED ORDER — ENTRESTO 24-26 MG PO TABS: 1.0000 | ORAL_TABLET | Freq: Two times a day (BID) | ORAL | 0 refills | Status: DC

## 2019-08-24 NOTE — Progress Notes (Signed)
Primary Physician/Referring:  Redmond School, MD  Patient ID: Kylie Tucker, female    DOB: [birth year], 69 y.o.   MRN: [id number]  Chief Complaint  Patient presents with  . Atrial Fibrillation  . Hospitalization Follow-up    Zio patch results   HPI:    Kylie Tucker  is a 69 y.o.  Caucasian female with hypertension, hyperlipidemia, hyperglycemia, chronic LBBB, nonischemic cardiomyopathy secondary to anthracycline therapy for breast cancer diagnosed in 2011, multiple medication allergies, admitted to the hospital on 07/26/2019 with left anterior cerebral circulation embolic CVA, underwent successful endovascular revascularization of the left ankle cerebral artery on 07/27/2019 with resolution of the right sided weakness.  EP consultation was also obtained who recommended event monitor for 2 weeks and rechecking LVEF by echocardiogram and if EF is improved no further therapy and loop recorder implantation but if EF is not improved, to consider BiV ICD implantation.  States that most of her neurologic deficits are essentially resolved.  She stopped taking simvastatin due to arthralgias.  She is presently not wearing an event monitor that was placed after discharge.  Past Medical History:  Diagnosis Date  . Allergy   . Anxiety   . Bilateral ovarian cysts    Do yearly ultrasound.  . Cancer ([place]) rt breast   2004/ chemo/ radiation  . Cellulitis and abscess of upper arm and forearm   . Chronic combined systolic and diastolic CHF (congestive heart failure) ([place])   . Dementia ([place])   . Family history of breast cancer   . Family history of rectal cancer   . GERD (gastroesophageal reflux disease)   . H/O Helicobacter infection 2017  . History of right mastectomy 2004   chemo/ radiation  . Hyperlipidemia   . Hypertension   . Insomnia   . Left bundle branch block 2016  . Migraines   . Moderate mitral regurgitation   . NICM (nonischemic cardiomyopathy) ([place])   . Pneumonia   . PONV  (postoperative nausea and vomiting)    cannot take zofran  . Postmastectomy lymphedema rt side  . Sentinel node    Past Surgical History:  Procedure Laterality Date  . BREAST EXCISIONAL BIOPSY Left    benign  . CARPAL TUNNEL RELEASE    . CATARACT EXTRACTION Left 07/12/2013  . IR ANGIO INTRA EXTRACRAN SEL COM CAROTID INNOMINATE UNI L MOD SED  07/26/2019  . IR ANGIO VERTEBRAL SEL SUBCLAVIAN INNOMINATE BILAT MOD SED  07/26/2019  . IR CT HEAD LTD  07/26/2019  . IR PERCUTANEOUS ART THROMBECTOMY/INFUSION INTRACRANIAL INC DIAG ANGIO  07/26/2019  . MANDIBLE SURGERY  1991  . MASTECTOMY Right    malignant  . NASAL SINUS SURGERY  1990/2001  . ORIF FINGER FRACTURE  02/14/2012   Procedure: OPEN REDUCTION INTERNAL FIXATION (ORIF) METACARPAL (FINGER) FRACTURE;  Surgeon: Wynonia Sours, MD;  Location: [place];  Service: Orthopedics;  Laterality: Right;  RIGHT FIFTH   . ORIF METACARPAL FRACTURE  8/13   rt   . ORIF PATELLA Right 11/07/2018   Procedure: OPEN REDUCTION INTERNAL (ORIF) FIXATION PATELLA;  Surgeon: Nicholes Stairs, MD;  Location: [place];  Service: Orthopedics;  Laterality: Right;  . ORIF PATELLA FRACTURE Right 11/07/2018  . PORT-A-CATH REMOVAL     in and now out  . RADIOLOGY WITH ANESTHESIA N/A 07/26/2019   Procedure: RADIOLOGY WITH ANESTHESIA;  Surgeon: Luanne Bras, MD;  Location: [place];  Service: Radiology;  Laterality: N/A;  . REPAIR EXTENSOR TENDON Left 05/30/2013  Procedure: RELEASE TRANSPOSITION EXTENSOR POLLICUS LONGUS LEFT WRIST;  Surgeon: Wynonia Sours, MD;  Location: [place];  Service: Orthopedics;  Laterality: Left;  . rt mastectomy  2004   lymph nodes-7-axillary node dissection  . SHOULDER ARTHROSCOPY WITH ROTATOR CUFF REPAIR AND SUBACROMIAL DECOMPRESSION Left 11/22/2013   Procedure: LEFT SHOULDER ARTHROSCOPY WITH SUBACROMIAL DECOMPRESSION DISTAL CLAVICLE RESECTION REPAIR  ROTATOR CUFF AS NEEDED ;  Surgeon: Cammie Sickle., MD;   Location: [place];  Service: Orthopedics;  Laterality: Left;  . WRIST FRACTURE SURGERY     Social History   Tobacco Use  . Smoking status: Never Smoker  . Smokeless tobacco: Never Used  Substance Use Topics  . Alcohol use: No    Alcohol/week: 0.0 standard drinks    Comment: rarely   ROS  Review of Systems  Cardiovascular: Negative for dyspnea on exertion and leg swelling.  Gastrointestinal: Negative for melena.   Objective  Blood pressure 124/61, pulse 77, temperature (!) 97.3 F (36.3 C), height 5\' 2"  (1.575 m), weight 154 lb 1.6 oz (69.9 kg), last menstrual period 01/10/2003, SpO2 98 %.  Vitals with BMI 08/24/2019 08/22/2019 08/15/2019  Height 5\' 2"  5\' 2"  5\' 2"   Weight 154 lbs 2 oz 155 lbs 14 oz 157 lbs  BMI 28.18 [id number] [id number]  Systolic [id number]  Diastolic 61 64 68  Pulse 77 68 77     Physical Exam  Neck: No thyromegaly present.  Cardiovascular: Normal rate, regular rhythm, intact distal pulses and normal pulses.  S1 normal, S2 paradoxical splitting present.  No gallop or murmur.  No leg edema, no JVD.  Pulmonary/Chest: Effort normal and breath sounds normal.  Abdominal: Soft. Bowel sounds are normal.  Musculoskeletal:     Cervical back: Neck supple.  Skin: Skin is warm and dry.   Laboratory examination:   Recent Labs    07/30/19 0224 07/31/19 0319 08/07/19 0800  NA 141 140 139  K 3.4* 3.6 3.6  CL 102 103 104  CO2 27 27 26   GLUCOSE 111* 98 88  BUN 20 14 19   CREATININE 1.29* 1.11* 0.95  CALCIUM 8.6* 8.4* 8.8*  GFRNONAA 43* 51* >60  GFRAA 49* 59* >60   estimated creatinine clearance is 51.9 mL/min (by C-G formula based on SCr of 0.95 mg/dL).  CMP Latest Ref Rng & Units 08/07/2019 07/31/2019 07/30/2019  Glucose 70 - 99 mg/dL 88 98 111(H)  BUN 8 - 23 mg/dL 19 14 20   Creatinine 0.44 - 1.00 mg/dL 0.95 1.11(H) 1.29(H)  Sodium 135 - 145 mmol/L 139 140 141  Potassium 3.5 - 5.1 mmol/L 3.6 3.6 3.4(L)  Chloride 98 - 111 mmol/L 104 103 102  CO2 22 -  32 mmol/L 26 27 27   Calcium 8.9 - 10.3 mg/dL 8.8(L) 8.4(L) 8.6(L)  Total Protein 6.5 - 8.1 g/dL - - -  Total Bilirubin 0.3 - 1.2 mg/dL - - -  Alkaline Phos 38 - 126 U/L - - -  AST 15 - 41 U/L - - -  ALT 0 - 44 U/L - - -   CBC Latest Ref Rng & Units 08/07/2019 07/31/2019 07/30/2019  WBC 4.0 - 10.5 K/uL 7.2 7.2 8.9  Hemoglobin 12.0 - 15.0 g/dL 13.1 13.7 14.4  Hematocrit 36.0 - 46.0 % 41.8 41.3 44.3  Platelets 150 - 400 K/uL 273 234 263   Lipid Panel     Component Value Date/Time   CHOL 178 08/07/2019 0800   TRIG 94 08/07/2019 0800  HDL 33 (L) 08/07/2019 0800   CHOLHDL 5.4 08/07/2019 0800   VLDL 19 08/07/2019 0800   LDLCALC 126 (H) 08/07/2019 0800   LDLDIRECT 145.4 05/04/2010 0827   HEMOGLOBIN A1C Lab Results  Component Value Date   HGBA1C 6.2 (H) 07/27/2019   MPG 131.24 07/27/2019   TSH 4.312 04/06/2018   Medications and allergies   Allergies  Allergen Reactions  . Clonazepam Shortness Of Breath and Other (See Comments)    Heart had a "weird feeling" and difficulty breathing  . Hydrocodone-Acetaminophen Nausea And Vomiting    GI Upset (intolerance) Patient stated 4/28 that she is able to take Hydrocodone  . Rosuvastatin Other (See Comments)    myalgia  . Trazodone And Nefazodone Nausea Only and Other (See Comments)    Felt like "I was burning all over" -patient  . Acetaminophen Nausea Only  . Amoxicillin-Pot Clavulanate Nausea Only    flushing Did it involve swelling of the face/tongue/throat, SOB, or low BP? No Did it involve sudden or severe rash/hives, skin peeling, or any reaction on the inside of your mouth or nose? No Did you need to seek medical attention at a hospital or doctor's office? Unknown When did it last happen?5 years If all above answers are "NO", may proceed with cephalosporin use.   . Cefuroxime Axetil Nausea Only  . Cephalexin Nausea Only  . Levofloxacin Nausea Only  . Morphine And Related Other (See Comments)    Hallucinations  .  Ondansetron Nausea And Vomiting  . Propoxyphene N-Acetaminophen Nausea And Vomiting  . Sulfonamide Derivatives Itching  . Vioxx [Rofecoxib] Nausea And Vomiting  . Aspirin Nausea And Vomiting    Gastrointestinal upset  . Azithromycin Palpitations  . Clarithromycin Nausea Only and Anxiety  . Codeine Itching and Rash  . Milk-Related Compounds Nausea And Vomiting  . Seroquel [Quetiapine Fumarate] Palpitations    Heart racing   . Sulfamethoxazole Rash  . Tramadol Nausea And Vomiting     Current Outpatient Medications  Medication Instructions  . albuterol (VENTOLIN HFA) 108 (90 Base) MCG/ACT inhaler 2 puffs, Inhalation, Every 6 hours PRN, As needed for wheezing or SOB. Wait 5 minutes between inhalations of multiple inhalers.   Marland Kitchen amLODipine (NORVASC) 5 mg, Oral, Daily  . clopidogrel (PLAVIX) 75 mg, Oral, Daily  . dextromethorphan-guaiFENesin (ROBITUSSIN-DM) 10-100 MG/5ML liquid 5 mLs, Oral, Every 4 hours PRN  . diazepam (VALIUM) 5 mg, Oral, Daily at bedtime  . furosemide (LASIX) 20 mg, Oral, Daily  . metoprolol succinate (TOPROL-XL) 50 mg, Oral, Daily at bedtime  . Multiple Vitamin (MULTIVITAMIN WITH MINERALS) TABS tablet 1 tablet, Oral, Daily  . NON FORMULARY Diet: _____ Regular, ___x___ NAS, _______Consistent Carbohydrate, _______NPO _____Other   . sacubitril-valsartan (ENTRESTO) 24-26 MG 1 tablet, Oral, 2 times daily   Radiology:   No results found.  Cardiac Studies:   Echocardiogram 07/26/2019:  Normal LV size, severe LV systolic dysfunction, EF 25 to 30% with global hypokinesis. Mild left atrial enlargement. Large left pleural effusion and small pericardial effusion. Compared to 04/06/2018, EF is further reduced from 40 to 45%.  Assessment     ICD-10-CM   1. Chronic systolic CHF (congestive heart failure), NYHA class 1 (HCC)  I50.22 EKG 12-Lead    sacubitril-valsartan (ENTRESTO) 24-26 MG  2. Acute ischemic cerebrovascular accident (CVA) involving anterior cerebral artery  territory [facility])  I63.529   3. LBBB (left bundle branch block)  I44.7     EKG 08/24/2019: Normal sinus rhythm with rate of 72 bpm, leftward enlargement, left  bundle branch block.  No further analysis. No significant change from  From prior EKG.   Meds ordered this encounter  Medications  . sacubitril-valsartan (ENTRESTO) 24-26 MG    Sig: Take 1 tablet by mouth 2 (two) times daily.    Dispense:  28 tablet    Refill:  0    Medications Discontinued During This Encounter  Medication Reason  . Nutritional Supplements (ENSURE CLEAR) LIQD Error    Recommendations:   Kylie Tucker  is a 69 y.o. Caucasian female with hypertension, hyperlipidemia, hyperglycemia, chronic LBBB, nonischemic cardiomyopathy secondary to anthracycline therapy for breast cancer diagnosed in 2011, multiple medication allergies, admitted to the hospital on 07/26/2019 with left anterior cerebral circulation embolic CVA, underwent successful endovascular revascularization of the left ankle cerebral artery on 07/27/2019 with resolution of the right sided weakness.  EP consultation was also obtained who recommended event monitor for 2 weeks and rechecking LVEF by echocardiogram and if EF is improved no further therapy and loop recorder implantation but if EF is not improved, to consider BiV ICD implantation. If Event monitor does not reveal any significant arrhythmia, she probably should have loop implanted to monitor for A. Fib as she is high risk.   She has discontinued Simvastatin due to arthralgia. I will need to address this on her next visit, long visit due to patient's complex history, review of records. Patient also had taken off her monitor inadvertently and we changed and again gave her instruction on how to use it. BP is controlled.  Will start Entresto 24/49mg  BID and check BMP in 10 days and see her prior to 2 weeks for titration and f/u of stage 3 CKD.   Adrian Prows, MD, [facility] 08/24/2019, 3:48 PM [facility] [facility] [place] Office: [phone]  CC: Antony Contras, MD (Neuro)

## 2019-08-24 NOTE — Patient Instructions (Signed)
Got to [facility] in 10 days.  Take entresto one tablet twice daily

## 2019-08-29 ENCOUNTER — Ambulatory Visit: Payer: PPO | Admitting: Cardiology

## 2019-08-29 DIAGNOSIS — Z681 Body mass index (BMI) 19 or less, adult: Secondary | ICD-10-CM | POA: Diagnosis not present

## 2019-08-29 DIAGNOSIS — J449 Chronic obstructive pulmonary disease, unspecified: Secondary | ICD-10-CM | POA: Diagnosis not present

## 2019-08-29 DIAGNOSIS — I639 Cerebral infarction, unspecified: Secondary | ICD-10-CM | POA: Diagnosis not present

## 2019-08-29 DIAGNOSIS — F0390 Unspecified dementia without behavioral disturbance: Secondary | ICD-10-CM | POA: Diagnosis not present

## 2019-09-09 DIAGNOSIS — I10 Essential (primary) hypertension: Secondary | ICD-10-CM | POA: Diagnosis not present

## 2019-09-09 DIAGNOSIS — Z170 Estrogen receptor positive status [ER+]: Secondary | ICD-10-CM | POA: Diagnosis not present

## 2019-09-09 DIAGNOSIS — M5030 Other cervical disc degeneration, unspecified cervical region: Secondary | ICD-10-CM | POA: Diagnosis not present

## 2019-09-09 DIAGNOSIS — C50111 Malignant neoplasm of central portion of right female breast: Secondary | ICD-10-CM | POA: Diagnosis not present

## 2019-09-10 ENCOUNTER — Ambulatory Visit: Payer: PPO | Admitting: Cardiology

## 2019-09-10 ENCOUNTER — Other Ambulatory Visit: Payer: Self-pay

## 2019-09-10 ENCOUNTER — Encounter: Payer: Self-pay | Admitting: Cardiology

## 2019-09-10 VITALS — BP 131/56 | HR 80 | Temp 98.7°F | Ht 62.0 in | Wt 164.9 lb

## 2019-09-10 DIAGNOSIS — E1122 Type 2 diabetes mellitus with diabetic chronic kidney disease: Secondary | ICD-10-CM | POA: Diagnosis not present

## 2019-09-10 DIAGNOSIS — N83201 Unspecified ovarian cyst, right side: Secondary | ICD-10-CM | POA: Diagnosis not present

## 2019-09-10 DIAGNOSIS — R32 Unspecified urinary incontinence: Secondary | ICD-10-CM | POA: Diagnosis not present

## 2019-09-10 DIAGNOSIS — I63529 Cerebral infarction due to unspecified occlusion or stenosis of unspecified anterior cerebral artery: Secondary | ICD-10-CM | POA: Diagnosis not present

## 2019-09-10 DIAGNOSIS — F0390 Unspecified dementia without behavioral disturbance: Secondary | ICD-10-CM | POA: Diagnosis not present

## 2019-09-10 DIAGNOSIS — I447 Left bundle-branch block, unspecified: Secondary | ICD-10-CM | POA: Diagnosis not present

## 2019-09-10 DIAGNOSIS — I428 Other cardiomyopathies: Secondary | ICD-10-CM | POA: Diagnosis not present

## 2019-09-10 DIAGNOSIS — N189 Chronic kidney disease, unspecified: Secondary | ICD-10-CM | POA: Diagnosis not present

## 2019-09-10 DIAGNOSIS — J449 Chronic obstructive pulmonary disease, unspecified: Secondary | ICD-10-CM | POA: Diagnosis not present

## 2019-09-10 DIAGNOSIS — I69351 Hemiplegia and hemiparesis following cerebral infarction affecting right dominant side: Secondary | ICD-10-CM | POA: Diagnosis not present

## 2019-09-10 DIAGNOSIS — N1831 Chronic kidney disease, stage 3a: Secondary | ICD-10-CM | POA: Diagnosis not present

## 2019-09-10 DIAGNOSIS — N83202 Unspecified ovarian cyst, left side: Secondary | ICD-10-CM | POA: Diagnosis not present

## 2019-09-10 DIAGNOSIS — I5022 Chronic systolic (congestive) heart failure: Secondary | ICD-10-CM

## 2019-09-10 DIAGNOSIS — I0981 Rheumatic heart failure: Secondary | ICD-10-CM | POA: Diagnosis not present

## 2019-09-10 DIAGNOSIS — I130 Hypertensive heart and chronic kidney disease with heart failure and stage 1 through stage 4 chronic kidney disease, or unspecified chronic kidney disease: Secondary | ICD-10-CM | POA: Diagnosis not present

## 2019-09-10 DIAGNOSIS — I5042 Chronic combined systolic (congestive) and diastolic (congestive) heart failure: Secondary | ICD-10-CM | POA: Diagnosis not present

## 2019-09-10 MED ORDER — METOPROLOL SUCCINATE ER 100 MG PO TB24: 100.0000 mg | ORAL_TABLET | Freq: Every day | ORAL | 1 refills | Status: DC

## 2019-09-10 NOTE — Progress Notes (Signed)
Primary Physician/Referring:  Redmond School, MD  Patient ID: Kylie Tucker, female    DOB: 16-Nov-1950, 69 y.o.   MRN: [id number]  Chief Complaint  Patient presents with  . Chronic Kidney Disease    Med titration  . Follow-up    2wk   HPI:    Kylie Tucker  is a 69 y.o.  Caucasian female with hypertension, hyperlipidemia, hyperglycemia, chronic LBBB, nonischemic cardiomyopathy secondary to anthracycline therapy for breast cancer diagnosed in 2011, multiple medication allergies, admitted to the hospital on 07/26/2019 with left anterior cerebral circulation embolic CVA, underwent successful endovascular revascularization of the left anterior cerebral artery on 07/27/2019 with resolution of the right sided weakness. Presently on plavix alone.   EP consultation was also obtained who recommended event monitor for 2 weeks and rechecking LVEF by echocardiogram and if EF is improved no further therapy and implant  loop recorder for AF evaluation.  If EF is not improved, to consider BiV ICD implantation. Most of her neurologic deficits are essentially resolved.  She just completed wearing an event monitor that was placed after discharge. Did not bring medications.   Past Medical History:  Diagnosis Date  . Allergy   . Anxiety   . Bilateral ovarian cysts    Do yearly ultrasound.  . Cancer ([place]) rt breast   2004/ chemo/ radiation  . Cellulitis and abscess of upper arm and forearm   . Chronic combined systolic and diastolic CHF (congestive heart failure) ([place])   . Dementia ([place])   . Family history of breast cancer   . Family history of rectal cancer   . GERD (gastroesophageal reflux disease)   . H/O Helicobacter infection 2017  . History of right mastectomy 2004   chemo/ radiation  . Hyperlipidemia   . Hypertension   . Insomnia   . Left bundle branch block 2016  . Migraines   . Moderate mitral regurgitation   . NICM (nonischemic cardiomyopathy) ([place])   . Pneumonia   . PONV  (postoperative nausea and vomiting)    cannot take zofran  . Postmastectomy lymphedema rt side  . Sentinel node    Past Surgical History:  Procedure Laterality Date  . BREAST EXCISIONAL BIOPSY Left    benign  . CARPAL TUNNEL RELEASE    . CATARACT EXTRACTION Left 07/12/2013  . IR ANGIO INTRA EXTRACRAN SEL COM CAROTID INNOMINATE UNI L MOD SED  07/26/2019  . IR ANGIO VERTEBRAL SEL SUBCLAVIAN INNOMINATE BILAT MOD SED  07/26/2019  . IR CT HEAD LTD  07/26/2019  . IR PERCUTANEOUS ART THROMBECTOMY/INFUSION INTRACRANIAL INC DIAG ANGIO  07/26/2019  . MANDIBLE SURGERY  1991  . MASTECTOMY Right    malignant  . NASAL SINUS SURGERY  1990/2001  . ORIF FINGER FRACTURE  02/14/2012   Procedure: OPEN REDUCTION INTERNAL FIXATION (ORIF) METACARPAL (FINGER) FRACTURE;  Surgeon: Wynonia Sours, MD;  Location: [place];  Service: Orthopedics;  Laterality: Right;  RIGHT FIFTH   . ORIF METACARPAL FRACTURE  8/13   rt   . ORIF PATELLA Right 11/07/2018   Procedure: OPEN REDUCTION INTERNAL (ORIF) FIXATION PATELLA;  Surgeon: Nicholes Stairs, MD;  Location: [place];  Service: Orthopedics;  Laterality: Right;  . ORIF PATELLA FRACTURE Right 11/07/2018  . PORT-A-CATH REMOVAL     in and now out  . RADIOLOGY WITH ANESTHESIA N/A 07/26/2019   Procedure: RADIOLOGY WITH ANESTHESIA;  Surgeon: Luanne Bras, MD;  Location: [place];  Service: Radiology;  Laterality: N/A;  . REPAIR  EXTENSOR TENDON Left 05/30/2013   Procedure: RELEASE TRANSPOSITION EXTENSOR POLLICUS LONGUS LEFT WRIST;  Surgeon: Wynonia Sours, MD;  Location: [place];  Service: Orthopedics;  Laterality: Left;  . rt mastectomy  2004   lymph nodes-7-axillary node dissection  . SHOULDER ARTHROSCOPY WITH ROTATOR CUFF REPAIR AND SUBACROMIAL DECOMPRESSION Left 11/22/2013   Procedure: LEFT SHOULDER ARTHROSCOPY WITH SUBACROMIAL DECOMPRESSION DISTAL CLAVICLE RESECTION REPAIR  ROTATOR CUFF AS NEEDED ;  Surgeon: Cammie Sickle., MD;   Location: [place];  Service: Orthopedics;  Laterality: Left;  . WRIST FRACTURE SURGERY     Social History   Tobacco Use  . Smoking status: Never Smoker  . Smokeless tobacco: Never Used  Substance Use Topics  . Alcohol use: No    Alcohol/week: 0.0 standard drinks    Comment: rarely   ROS  Review of Systems  Cardiovascular: Negative for dyspnea on exertion and leg swelling.  Gastrointestinal: Negative for melena.   Objective  Blood pressure (!) 131/56, pulse 80, temperature 98.7 F (37.1 C), height 5\' 2"  (1.575 m), weight 164 lb 14.4 oz (74.8 kg), last menstrual period 01/10/2003, SpO2 98 %.  Vitals with BMI 09/10/2019 08/24/2019 08/22/2019  Height 5\' 2"  5\' 2"  5\' 2"   Weight 164 lbs 14 oz 154 lbs 2 oz 155 lbs 14 oz  BMI 30.15 [id number]  Systolic A999333 A999333 99991111  Diastolic 56 61 64  Pulse 80 77 68     Physical Exam  Neck: No thyromegaly present.  Cardiovascular: Normal rate, regular rhythm, intact distal pulses and normal pulses.  S1 normal, S2 paradoxical splitting present.  No gallop or murmur.  No leg edema, no JVD.  Pulmonary/Chest: Effort normal and breath sounds normal.  Abdominal: Soft. Bowel sounds are normal.  Musculoskeletal:     Cervical back: Neck supple.  Skin: Skin is warm and dry.   Laboratory examination:   Recent Labs    07/30/19 0224 07/31/19 0319 08/07/19 0800  NA 141 140 139  K 3.4* 3.6 3.6  CL 102 103 104  CO2 27 27 26   GLUCOSE 111* 98 88  BUN 20 14 19   CREATININE 1.29* 1.11* 0.95  CALCIUM 8.6* 8.4* 8.8*  GFRNONAA 43* 51* >60  GFRAA 49* 59* >60   CrCl cannot be calculated (Patient's most recent lab result is older than the maximum 21 days allowed.).  CMP Latest Ref Rng & Units 08/07/2019 07/31/2019 07/30/2019  Glucose 70 - 99 mg/dL 88 98 111(H)  BUN 8 - 23 mg/dL 19 14 20   Creatinine 0.44 - 1.00 mg/dL 0.95 1.11(H) 1.29(H)  Sodium 135 - 145 mmol/L 139 140 141  Potassium 3.5 - 5.1 mmol/L 3.6 3.6 3.4(L)  Chloride 98 - 111 mmol/L  104 103 102  CO2 22 - 32 mmol/L 26 27 27   Calcium 8.9 - 10.3 mg/dL 8.8(L) 8.4(L) 8.6(L)  Total Protein 6.5 - 8.1 g/dL - - -  Total Bilirubin 0.3 - 1.2 mg/dL - - -  Alkaline Phos 38 - 126 U/L - - -  AST 15 - 41 U/L - - -  ALT 0 - 44 U/L - - -   CBC Latest Ref Rng & Units 08/07/2019 07/31/2019 07/30/2019  WBC 4.0 - 10.5 K/uL 7.2 7.2 8.9  Hemoglobin 12.0 - 15.0 g/dL 13.1 13.7 14.4  Hematocrit 36.0 - 46.0 % 41.8 41.3 44.3  Platelets 150 - 400 K/uL 273 234 263   Lipid Panel     Component Value Date/Time   CHOL  178 08/07/2019 0800   TRIG 94 08/07/2019 0800   HDL 33 (L) 08/07/2019 0800   CHOLHDL 5.4 08/07/2019 0800   VLDL 19 08/07/2019 0800   LDLCALC 126 (H) 08/07/2019 0800   LDLDIRECT 145.4 05/04/2010 0827   HEMOGLOBIN A1C Lab Results  Component Value Date   HGBA1C 6.2 (H) 07/27/2019   MPG 131.24 07/27/2019   TSH 4.312 04/06/2018   Medications and allergies   Allergies  Allergen Reactions  . Clonazepam Shortness Of Breath and Other (See Comments)    Heart had a "weird feeling" and difficulty breathing  . Hydrocodone-Acetaminophen Nausea And Vomiting    GI Upset (intolerance) Patient stated 4/28 that she is able to take Hydrocodone  . Rosuvastatin Other (See Comments)    myalgia  . Trazodone And Nefazodone Nausea Only and Other (See Comments)    Felt like "I was burning all over" -patient  . Acetaminophen Nausea Only  . Amoxicillin-Pot Clavulanate Nausea Only    flushing Did it involve swelling of the face/tongue/throat, SOB, or low BP? No Did it involve sudden or severe rash/hives, skin peeling, or any reaction on the inside of your mouth or nose? No Did you need to seek medical attention at a hospital or doctor's office? Unknown When did it last happen?5 years If all above answers are "NO", may proceed with cephalosporin use.   . Cefuroxime Axetil Nausea Only  . Cephalexin Nausea Only  . Levofloxacin Nausea Only  . Morphine And Related Other (See Comments)     Hallucinations  . Ondansetron Nausea And Vomiting  . Propoxyphene N-Acetaminophen Nausea And Vomiting  . Sulfonamide Derivatives Itching  . Vioxx [Rofecoxib] Nausea And Vomiting  . Aspirin Nausea And Vomiting    Gastrointestinal upset  . Azithromycin Palpitations  . Clarithromycin Nausea Only and Anxiety  . Codeine Itching and Rash  . Milk-Related Compounds Nausea And Vomiting  . Seroquel [Quetiapine Fumarate] Palpitations    Heart racing   . Sulfamethoxazole Rash  . Tramadol Nausea And Vomiting     Current Outpatient Medications  Medication Instructions  . albuterol (VENTOLIN HFA) 108 (90 Base) MCG/ACT inhaler 2 puffs, Inhalation, Every 6 hours PRN, As needed for wheezing or SOB. Wait 5 minutes between inhalations of multiple inhalers.   . clopidogrel (PLAVIX) 75 mg, Oral, Daily  . dextromethorphan-guaiFENesin (ROBITUSSIN-DM) 10-100 MG/5ML liquid 5 mLs, Oral, Every 4 hours PRN  . diazepam (VALIUM) 5 mg, Oral, Daily at bedtime  . furosemide (LASIX) 20 mg, Oral, Daily  . metoprolol succinate (TOPROL-XL) 100 mg, Oral, Daily at bedtime  . Multiple Vitamin (MULTIVITAMIN WITH MINERALS) TABS tablet 1 tablet, Oral, Daily  . NON FORMULARY Diet: _____ Regular, ___x___ NAS, _______Consistent Carbohydrate, _______NPO _____Other   . sacubitril-valsartan (ENTRESTO) 24-26 MG 1 tablet, Oral, 2 times daily  . simvastatin (ZOCOR) 20 mg, Oral, Daily-1800   Radiology:   No results found.  Cardiac Studies:   Echocardiogram 07/26/2019:  Normal LV size, severe LV systolic dysfunction, EF 25 to 30% with global hypokinesis. Mild left atrial enlargement. Large left pleural effusion and small pericardial effusion. Compared to 04/06/2018, EF is further reduced from 40 to 45%.  Assessment     ICD-10-CM   1. Chronic systolic CHF (congestive heart failure), NYHA class 1 (HCC)  I50.22   2. LBBB (left bundle branch block)  I44.7   3. Acute ischemic cerebrovascular accident (CVA) involving anterior  cerebral artery territory [facility])  I63.529   4. Stage 3a chronic kidney disease  N18.31  EKG 08/24/2019: Normal sinus rhythm with rate of 72 bpm, leftward enlargement, left bundle branch block.  No further analysis. No significant change from  From prior EKG.   Meds ordered this encounter  Medications  . metoprolol succinate (TOPROL-XL) 100 MG 24 hr tablet    Sig: Take 1 tablet (100 mg total) by mouth at bedtime.    Dispense:  90 tablet    Refill:  1    Medications Discontinued During This Encounter  Medication Reason  . amLODipine (NORVASC) 5 MG tablet Change in therapy  . metoprolol succinate (TOPROL-XL) 50 MG 24 hr tablet Reorder    Recommendations:   TAKEA CARLOW  is a 69 y.o. Caucasian female with hypertension, hyperlipidemia, hyperglycemia, chronic LBBB, nonischemic cardiomyopathy secondary to anthracycline therapy for breast cancer diagnosed in 2011, multiple medication allergies, admitted to the hospital on 07/26/2019 with left anterior cerebral circulation embolic CVA, underwent successful endovascular revascularization of the left anterior cerebral artery on 07/27/2019 with resolution of the right sided weakness. Presently on plavix alone.   EP consultation was also obtained who recommended event monitor for 2 weeks and rechecking LVEF by echocardiogram and if EF is improved no further therapy and implant  loop recorder for AF evaluation.  If EF is not improved, to consider BiV ICD implantation.    They did not bring any of the medications, husband states that patient is taking simvastatin for hyperlipidemia.  He also did not get the labs done, patient has underlying stage III chronic kidney disease, I have started her on Entresto low-dose.  Will go to the lab today prior to going home.  I spent 40 minutes with him regarding compliance, discussions regarding medication use, discussions regarding follow-up with physicians and to bring all the medications with every visit.  Another  sample bottle of Entresto 24/26 mg was given to the patient, not to start or escalate the dose to 49/51 mg (sample given for initiation) until renal function is known.  I would like to see her back in 2 weeks for follow-up.  Adrian Prows, MD, [facility] 09/10/2019, 3:39 PM [place] [place] Office: [phone]  CC: Antony Contras, MD (Neuro)

## 2019-09-11 ENCOUNTER — Encounter: Payer: Self-pay | Admitting: Cardiology

## 2019-09-11 LAB — BRAIN NATRIURETIC PEPTIDE: BNP: 408.4 pg/mL — ABNORMAL HIGH (ref 0.0–100.0)

## 2019-09-11 NOTE — Progress Notes (Signed)
Did [facility] do BMP as well or not

## 2019-09-18 ENCOUNTER — Encounter: Payer: Self-pay | Admitting: Adult Health

## 2019-09-18 ENCOUNTER — Ambulatory Visit: Payer: PPO | Admitting: Adult Health

## 2019-09-18 ENCOUNTER — Other Ambulatory Visit: Payer: Self-pay

## 2019-09-18 VITALS — BP 147/72 | HR 81 | Temp 97.1°F | Ht 62.25 in | Wt 168.2 lb

## 2019-09-18 DIAGNOSIS — I63529 Cerebral infarction due to unspecified occlusion or stenosis of unspecified anterior cerebral artery: Secondary | ICD-10-CM

## 2019-09-18 DIAGNOSIS — I5022 Chronic systolic (congestive) heart failure: Secondary | ICD-10-CM | POA: Diagnosis not present

## 2019-09-18 DIAGNOSIS — I10 Essential (primary) hypertension: Secondary | ICD-10-CM | POA: Diagnosis not present

## 2019-09-18 DIAGNOSIS — E785 Hyperlipidemia, unspecified: Secondary | ICD-10-CM

## 2019-09-18 DIAGNOSIS — S40212A Abrasion of left shoulder, initial encounter: Secondary | ICD-10-CM

## 2019-09-18 NOTE — Progress Notes (Signed)
[facility] [address] [place]. [place] [address] [phone]       HOSPITAL FOLLOW UP NOTE  Ms. Kylie Tucker Date of Birth:  09/18/1950 Medical Record Number:  [id number]   Reason for Referral:  hospital stroke follow up    CHIEF COMPLAINT:  Chief Complaint  Patient presents with  . Hospitalization Follow-up    Husband present. Treatment room. Patient believes that she has develope a rash since taking the Plavix.     HPI: Kylie Tucker being seen today for in office hospital follow-up regarding left ACA stroke on 07/28/2019.  History obtained from patient, husband and chart review. Reviewed all radiology images and labs personally.  Kylie L Gaileyis a 69 y.o.femalewith history of HTN, HLD, migraines, breast cancer, CHF, dementia, [facility] R sided weakness. treated w/tPA 1/14 at 0600.Found to haveL A2 occlusion and transferred to [facility]. [facility] forIR. evalated by stroke team and Dr. Leonie Man with stroke work up revealing L ACA infarct with L A2 occlusion s/p TPA and IR with TICI 3 revascularization of left A2 reperfusion, infarct embolic secondary to cardiomyopathy with low EF versus possible atrial fibrillation.  2D echo showed an EF of 25 to 30%.  Recommended initiation of Plavix for secondary stroke prevention with aspirin history.  Recommended loop recorder placement to rule out atrial fibrillation but due to low EF, EP feels patient will likely need biventricular ICD a last EF improved.  Recommended follow-up with cardiology outpatient with cardiac monitor and if repeat EF remains less than 35% ICD will be placed.  If EF improves, loop recorder can be placed at that time.  HTN stable.  LDL 100 and change pravastatin to simvastatin. Other stroke risk factors include advanced age, obesity and migraines but no prior stroke history. Other active problems include breast cancer w/ R  effusion, pretracheal adenopathy (warrants evaluation once stable), AKI and hypokalemia. Discharged to SNF for ongoing therapy needs.   Kylie Tucker is a 69 year old female being seent oday for hospital follow-up accompanied by her husband.  She has since been discharged from SNF and has returned home and has been doing well without residual stroke deficits.  Underlying baseline dementia stable and denies worsening.  Continues on Plavix without bleeding or bruising.  She does have concerns regarding "rash" on her upper left chest and questions whether this is a Plavix side effect.  First noticed irritation approximately 1 week ago on her chest and more recently neck area consisting of large open skin area questionable prior blister with redness but denies pain.  She has recently completed cardiac monitor and question whether skin irritation from monitor placement.  No other areas of skin irritation or rash.  Currently awaiting cardiac event monitor results.  She has since discontinued statin due to side effects.  Continues to follow with Dr. Einar Gip.  Blood pressure today 147/72.  No further concerns at this time.    ROS:   14 system review of systems performed and negative with exception of skin irritation  PMH:  Past Medical History:  Diagnosis Date  . Allergy   . Anxiety   . Bilateral ovarian cysts    Do yearly ultrasound.  . Cancer ([place]) rt breast   2004/ chemo/ radiation  . Cellulitis and abscess of upper arm and forearm   . Chronic combined systolic and diastolic CHF (congestive heart failure) ([place])   . Dementia ([place])   . Family  history of breast cancer   . Family history of rectal cancer   . GERD (gastroesophageal reflux disease)   . H/O Helicobacter infection 2017  . History of right mastectomy 2004   chemo/ radiation  . Hyperlipidemia   . Hypertension   . Insomnia   . Left bundle branch block 2016  . Migraines   . Moderate mitral regurgitation   . NICM (nonischemic  cardiomyopathy) ([place])   . Pneumonia   . PONV (postoperative nausea and vomiting)    cannot take zofran  . Postmastectomy lymphedema rt side  . Sentinel node     PSH:  Past Surgical History:  Procedure Laterality Date  . BREAST EXCISIONAL BIOPSY Left    benign  . CARPAL TUNNEL RELEASE    . CATARACT EXTRACTION Left 07/12/2013  . IR ANGIO INTRA EXTRACRAN SEL COM CAROTID INNOMINATE UNI L MOD SED  07/26/2019  . IR ANGIO VERTEBRAL SEL SUBCLAVIAN INNOMINATE BILAT MOD SED  07/26/2019  . IR CT HEAD LTD  07/26/2019  . IR PERCUTANEOUS ART THROMBECTOMY/INFUSION INTRACRANIAL INC DIAG ANGIO  07/26/2019  . MANDIBLE SURGERY  1991  . MASTECTOMY Right    malignant  . NASAL SINUS SURGERY  1990/2001  . ORIF FINGER FRACTURE  02/14/2012   Procedure: OPEN REDUCTION INTERNAL FIXATION (ORIF) METACARPAL (FINGER) FRACTURE;  Surgeon: Wynonia Sours, MD;  Location: [place];  Service: Orthopedics;  Laterality: Right;  RIGHT FIFTH   . ORIF METACARPAL FRACTURE  8/13   rt   . ORIF PATELLA Right 11/07/2018   Procedure: OPEN REDUCTION INTERNAL (ORIF) FIXATION PATELLA;  Surgeon: Nicholes Stairs, MD;  Location: [place];  Service: Orthopedics;  Laterality: Right;  . ORIF PATELLA FRACTURE Right 11/07/2018  . PORT-A-CATH REMOVAL     in and now out  . RADIOLOGY WITH ANESTHESIA N/A 07/26/2019   Procedure: RADIOLOGY WITH ANESTHESIA;  Surgeon: Luanne Bras, MD;  Location: [place];  Service: Radiology;  Laterality: N/A;  . REPAIR EXTENSOR TENDON Left 05/30/2013   Procedure: RELEASE TRANSPOSITION EXTENSOR POLLICUS LONGUS LEFT WRIST;  Surgeon: Wynonia Sours, MD;  Location: [place];  Service: Orthopedics;  Laterality: Left;  . rt mastectomy  2004   lymph nodes-7-axillary node dissection  . SHOULDER ARTHROSCOPY WITH ROTATOR CUFF REPAIR AND SUBACROMIAL DECOMPRESSION Left 11/22/2013   Procedure: LEFT SHOULDER ARTHROSCOPY WITH SUBACROMIAL DECOMPRESSION DISTAL CLAVICLE RESECTION REPAIR  ROTATOR CUFF  AS NEEDED ;  Surgeon: Cammie Sickle., MD;  Location: [place];  Service: Orthopedics;  Laterality: Left;  . WRIST FRACTURE SURGERY      Social History:  Social History   Socioeconomic History  . Marital status: Married    Spouse name: Not on file  . Number of children: 1  . Years of education: Not on file  . Highest education level: Not on file  Occupational History  . Occupation: retired  Tobacco Use  . Smoking status: Never Smoker  . Smokeless tobacco: Never Used  Substance and Sexual Activity  . Alcohol use: No    Alcohol/week: 0.0 standard drinks    Comment: rarely  . Drug use: No  . Sexual activity: Not Currently    Partners: Male    Birth control/protection: Post-menopausal  Other Topics Concern  . Not on file  Social History Narrative  . Not on file   Social Determinants of Health   Financial Resource Strain:   . Difficulty of Paying Living Expenses: Not on file  Food Insecurity:   .  Worried About Charity fundraiser in the Last Year: Not on file  . Ran Out of Food in the Last Year: Not on file  Transportation Needs:   . Lack of Transportation (Medical): Not on file  . Lack of Transportation (Non-Medical): Not on file  Physical Activity:   . Days of Exercise per Week: Not on file  . Minutes of Exercise per Session: Not on file  Stress:   . Feeling of Stress : Not on file  Social Connections:   . Frequency of Communication with Friends and Family: Not on file  . Frequency of Social Gatherings with Friends and Family: Not on file  . Attends Religious Services: Not on file  . Active Member of Clubs or Organizations: Not on file  . Attends Archivist Meetings: Not on file  . Marital Status: Not on file  Intimate Partner Violence:   . Fear of Current or Ex-Partner: Not on file  . Emotionally Abused: Not on file  . Physically Abused: Not on file  . Sexually Abused: Not on file    Family History:  Family History  Problem  Relation Age of Onset  . Heart disease Father   . Heart disease Mother   . Hypertension Mother   . Rectal cancer Mother        dx in her early 58s  . Colon polyps Mother   . Breast cancer Maternal Aunt        dx in her early 25s  . Heart disease Paternal Uncle   . Heart disease Paternal Grandmother   . Heart disease Paternal Grandfather   . Diabetes Brother   . Breast cancer Maternal Grandmother        dx in her 79s  . Breast cancer Cousin        dx in early 43s  . Breast cancer Cousin 27  . Hepatitis C Son   . Hypertension Son   . Post-traumatic stress disorder Son     Medications:   Current Outpatient Medications on File Prior to Visit  Medication Sig Dispense Refill  . albuterol (VENTOLIN HFA) 108 (90 Base) MCG/ACT inhaler Inhale 2 puffs into the lungs every 6 (six) hours as needed for wheezing or shortness of breath. As needed for wheezing or SOB. Wait 5 minutes between inhalations of multiple inhalers. 8 g 0  . clopidogrel (PLAVIX) 75 MG tablet Take 1 tablet (75 mg total) by mouth daily. 30 tablet 0  . dextromethorphan-guaiFENesin (ROBITUSSIN-DM) 10-100 MG/5ML liquid Take 5 mLs by mouth every 4 (four) hours as needed for cough.    . diazepam (VALIUM) 10 MG tablet Take 0.5 tablets (5 mg total) by mouth at bedtime. 4 tablet 0  . furosemide (LASIX) 20 MG tablet Take 1 tablet (20 mg total) by mouth daily. 30 tablet 0  . metoprolol succinate (TOPROL-XL) 100 MG 24 hr tablet Take 1 tablet (100 mg total) by mouth at bedtime. 90 tablet 1  . Multiple Vitamin (MULTIVITAMIN WITH MINERALS) TABS tablet Take 1 tablet by mouth daily.    . NON FORMULARY Diet: _____ Regular,  ___x___ NAS,  _______Consistent Carbohydrate,  _______NPO  _____Other    . sacubitril-valsartan (ENTRESTO) 24-26 MG Take 1 tablet by mouth 2 (two) times daily. 28 tablet 0  . triazolam (HALCION) 0.25 MG tablet Take by mouth at bedtime. 1/2 tablet     No current facility-administered medications on file prior to  visit.    Allergies:   Allergies  Allergen Reactions  .  Clonazepam Shortness Of Breath and Other (See Comments)    Heart had a "weird feeling" and difficulty breathing  . Hydrocodone-Acetaminophen Nausea And Vomiting    GI Upset (intolerance) Patient stated 4/28 that she is able to take Hydrocodone  . Rosuvastatin Other (See Comments)    myalgia  . Trazodone And Nefazodone Nausea Only and Other (See Comments)    Felt like "I was burning all over" -patient  . Acetaminophen Nausea Only  . Amoxicillin-Pot Clavulanate Nausea Only    flushing Did it involve swelling of the face/tongue/throat, SOB, or low BP? No Did it involve sudden or severe rash/hives, skin peeling, or any reaction on the inside of your mouth or nose? No Did you need to seek medical attention at a hospital or doctor's office? Unknown When did it last happen?5 years If all above answers are "NO", may proceed with cephalosporin use.   . Cefuroxime Axetil Nausea Only  . Cephalexin Nausea Only  . Levofloxacin Nausea Only  . Morphine And Related Other (See Comments)    Hallucinations  . Ondansetron Nausea And Vomiting  . Propoxyphene N-Acetaminophen Nausea And Vomiting  . Sulfonamide Derivatives Itching  . Vioxx [Rofecoxib] Nausea And Vomiting  . Aspirin Nausea And Vomiting    Gastrointestinal upset  . Azithromycin Palpitations  . Clarithromycin Nausea Only and Anxiety  . Codeine Itching and Rash  . Milk-Related Compounds Nausea And Vomiting  . Seroquel [Quetiapine Fumarate] Palpitations    Heart racing   . Sulfamethoxazole Rash  . Tramadol Nausea And Vomiting     Physical Exam  Vitals:   09/18/19 1410  BP: (!) 147/72  Pulse: 81  Temp: (!) 97.1 F (36.2 C)  TempSrc: Oral  Weight: 168 lb 3.2 oz (76.3 kg)  Height: 5' 2.25" (1.581 m)   Body mass index is 30.52 kg/m. No exam data present  No flowsheet data found.   General: well developed, well nourished,  pleasant middle-age Caucasian  female, seated, in no evident distress Head: head normocephalic and atraumatic.   Neck: supple with no carotid or supraclavicular bruits Cardiovascular: regular rate and rhythm, no murmurs Musculoskeletal: no deformity Skin:   Upper left chest and neck abrasion with redness and warmth.  No evidence of purulent drainage. Vascular:  Normal pulses all extremities; chronic RUE lymphedema   Neurologic Exam Mental Status: Awake and fully alert. Oriented to place and time. Recent and remote memory diminished.  Attention span, concentration and fund of knowledge diminished with baseline dementia. Mood and affect appropriate.  Cranial Nerves: Fundoscopic exam reveals sharp disc margins. Pupils equal, briskly reactive to light. Extraocular movements full without nystagmus. Visual fields full to confrontation. Hearing intact. Facial sensation intact. Face, tongue, palate moves normally and symmetrically.  Motor: Normal bulk and tone. Normal strength in all tested extremity muscles. Sensory.: intact to touch , pinprick , position and vibratory sensation.  Coordination: Rapid alternating movements normal in all extremities. Finger-to-nose and heel-to-shin performed accurately bilaterally. Gait and Station: Arises from chair without difficulty. Stance is normal. Gait demonstrates normal stride length and balance Reflexes: 1+ and symmetric. Toes downgoing.     NIHSS  0 Modified Rankin  0    Diagnostic Data (Labs, Imaging, Testing)   Code Stroke CT head No acute abnormality.   CTA head &neck L A2 embolism w/ short segment occlusion. Cardiomyopathy and layering pleural effusions.B ICA looping in neck. L A1 aplastic.   CT perfusion 8cc core, 22cc penumbra  Pre IR CT following neuro changes without ICH post  tPA   Cerebral angio L ACA A2 occlusion s/p TICI3 revascularization  CT head L frontal lobe ACA infarct   MRI - 07/28/19 - Evolving acute ischemic left ACA territory infarct.  Associatedminimal petechial hemorrhage without hemorrhagic transformation ormass effect.Additional scattered small volume subcentimeter cortical andsubcortical ischemic infarcts involving both cerebral hemispheres.Underlying mild chronic microvascular ischemic disease.  MRA - 07/28/19 - Interval revascularization of previously seen short-segment leftA2 occlusion. No large vessel occlusion now evident.Mild atherosclerotic change elsewhere throughout the intracranialcirculation. No other hemodynamically significant or correctablestenosis.  2D EchoEF 25-30%. No LV thrombus   LDL100  HgbA1c6.2    ASSESSMENT: Kylie Tucker is a 69 y.o. year old female presented with right-sided weakness on 07/26/2019 with stroke work-up revealing left ACA infarct with L A2 occlusion s/p TPA and IR with TICI 2C L A2 reperfusion, infarct embolic secondary to cardiomyopathy with low EF versus possible atrial fibrillation.  Recommended further atrial fibrillation monitoring with cardiology outpatient follow-up with cardiac event monitor repeat echo.  Plans on if EF remains less than 35% ICD will be placed or if EF improves consideration of loop recorder at that time.  Vascular risk factors include HTN, HLD, migraines, hx of breast cancer, CHF and baseline dementia.  Recovered well from a stroke standpoint without residual deficits.    PLAN:  1. L ACA stroke: Continue clopidogrel 75 mg daily  for secondary stroke prevention.  Low suspicion for side effect/allergy of Plavix in regards to skin concern but does have follow-up with PCP in the near future for further evaluation.  Self discontinued statin usage due to side effects and advised to follow-up with cardiology for possible initiation of PCSK9 inhibitor.  Maintain strict control of hypertension with blood pressure goal below 130/90, diabetes with hemoglobin A1c goal below 6.5% and cholesterol with LDL cholesterol (bad cholesterol) goal below 70 mg/dL.  I  also advised the patient to eat a healthy diet with plenty of whole grains, cereals, fruits and vegetables, exercise regularly with at least 30 minutes of continuous activity daily and maintain ideal body weight. 2. HTN: Advised to continue current treatment regimen.  Today's BP stable.  Advised to continue to monitor at home along with continued follow-up with PCP for management 3. HLD: Statin intolerance with patient reporting myalgias.  Recommend follow-up with cardiology for possible initiation of PCSK9 inhibitor for HLD management and secondary stroke prevention 4. Cardiac condition: Continue to follow with Dr. Einar Gip for ongoing monitoring and management.  Currently awaiting results for cardiac monitor and plans on repeating echocardiogram in the near future.  Possible consideration of loop recorder if no indication for ICD placement. 5. Left chest skin abrasion: Low suspicion for medication allergy.  Question if possible area where cardiac monitor was placed and possible irritation.  Placed triple antibiotic ointment on open area and covered with nonadherent gauze and paper tape.  Further evaluation by PCP in the near future.  Advised to avoid use of perfumes, scented lotions or scented body soaps in that area due to likely increased irritation.    Follow up in 4 months or call earlier if needed   Greater than 50% of time during this 45 minute visit was spent on counseling, explanation of diagnosis of left ACA stroke, reviewing risk factor management of HTN, HLD, CHF, planning of further management along with potential future management, and discussion with patient and family answering all questions.    Frann Rider, AGNP-BC  [facility] [address] [place], [place] [address]  Phone [phone] Fax [phone] Note: This document was prepared with digital dictation and possible smart phrase technology. Any transcriptional errors that result from this  process are unintentional.

## 2019-09-18 NOTE — Patient Instructions (Signed)
Continue aspirin 81 mg daily  for secondary stroke prevention  Follow up with Dr. Einar Gip regarding cholesterol monitoring and management as well as other active cardiac concerns  Follow up with PCP regarding skin concerns on your chest - low suspicion this is an allergy to plavix. Please ensure you keep clean and avoid perfumes, body lotions and body soaps which can irritate further  Continue to monitor blood pressure at home  Maintain strict control of hypertension with blood pressure goal below 130/90, diabetes with hemoglobin A1c goal below 6.5% and cholesterol with LDL cholesterol (bad cholesterol) goal below 70 mg/dL. I also advised the patient to eat a healthy diet with plenty of whole grains, cereals, fruits and vegetables, exercise regularly and maintain ideal body weight.  Followup in the future with me in 4 months or call earlier if needed       Thank you for coming to see [place] at [facility]. I hope we have been able to provide you high quality care today.  You may receive a patient satisfaction survey over the next few weeks. We would appreciate your feedback and comments so that we may continue to improve ourselves and the health of our patients.

## 2019-09-19 ENCOUNTER — Encounter: Payer: Self-pay | Admitting: Adult Health

## 2019-09-19 NOTE — Progress Notes (Signed)
I will send her if no improvement in EF. THanks

## 2019-09-20 DIAGNOSIS — T148XXA Other injury of unspecified body region, initial encounter: Secondary | ICD-10-CM | POA: Diagnosis not present

## 2019-09-20 DIAGNOSIS — E6609 Other obesity due to excess calories: Secondary | ICD-10-CM | POA: Diagnosis not present

## 2019-09-20 DIAGNOSIS — Z6830 Body mass index (BMI) 30.0-30.9, adult: Secondary | ICD-10-CM | POA: Diagnosis not present

## 2019-09-26 ENCOUNTER — Inpatient Hospital Stay: Payer: PPO | Admitting: Adult Health

## 2019-09-27 ENCOUNTER — Ambulatory Visit: Payer: PPO | Admitting: Cardiology

## 2019-09-27 ENCOUNTER — Other Ambulatory Visit: Payer: Self-pay

## 2019-09-27 ENCOUNTER — Encounter: Payer: Self-pay | Admitting: Cardiology

## 2019-09-27 VITALS — BP 130/69 | HR 74 | Temp 95.6°F | Resp 16 | Ht 62.5 in | Wt 162.4 lb

## 2019-09-27 DIAGNOSIS — E663 Overweight: Secondary | ICD-10-CM | POA: Diagnosis not present

## 2019-09-27 DIAGNOSIS — I5022 Chronic systolic (congestive) heart failure: Secondary | ICD-10-CM | POA: Diagnosis not present

## 2019-09-27 DIAGNOSIS — Z6829 Body mass index (BMI) 29.0-29.9, adult: Secondary | ICD-10-CM | POA: Diagnosis not present

## 2019-09-27 DIAGNOSIS — I69919 Unspecified symptoms and signs involving cognitive functions following unspecified cerebrovascular disease: Secondary | ICD-10-CM | POA: Diagnosis not present

## 2019-09-27 DIAGNOSIS — I447 Left bundle-branch block, unspecified: Secondary | ICD-10-CM | POA: Diagnosis not present

## 2019-09-27 DIAGNOSIS — I4891 Unspecified atrial fibrillation: Secondary | ICD-10-CM

## 2019-09-27 DIAGNOSIS — I63529 Cerebral infarction due to unspecified occlusion or stenosis of unspecified anterior cerebral artery: Secondary | ICD-10-CM | POA: Diagnosis not present

## 2019-09-27 NOTE — Progress Notes (Signed)
Primary Physician/Referring:  Redmond School, MD  Patient ID: Kylie Tucker, female    DOB: [birth year], 69 y.o.   MRN: [id number]  Chief Complaint  Patient presents with  . Congestive Heart Failure  . Follow-up    2 week   HPI:    Kylie Tucker  is a 69 y.o.  Caucasian female with hypertension, hyperlipidemia, hyperglycemia, chronic LBBB, nonischemic cardiomyopathy secondary to anthracycline therapy for breast cancer diagnosed in 2011, multiple medication allergies, admitted to the hospital on 07/26/2019 with left anterior cerebral circulation embolic CVA, underwent successful endovascular revascularization of the left anterior cerebral artery on 07/27/2019 with resolution of the right sided weakness. Presently on plavix alone.   EP consultation was also obtained who recommended event monitor for 2 weeks and rechecking LVEF by echocardiogram and if EF is improved no further therapy and implant  loop recorder for AF evaluation.  If EF is not improved, to consider BiV ICD implantation. Most of her neurologic deficits are essentially resolved.  30 day event monitor was unyielding.   Past Medical History:  Diagnosis Date  . Allergy   . Anxiety   . Bilateral ovarian cysts    Do yearly ultrasound.  . Cancer ([place]) rt breast   2004/ chemo/ radiation  . Cellulitis and abscess of upper arm and forearm   . Chronic combined systolic and diastolic CHF (congestive heart failure) ([place])   . Dementia ([place])   . Family history of breast cancer   . Family history of rectal cancer   . GERD (gastroesophageal reflux disease)   . H/O Helicobacter infection 2017  . History of right mastectomy 2004   chemo/ radiation  . Hyperlipidemia   . Hypertension   . Insomnia   . Left bundle branch block 2016  . Migraines   . Moderate mitral regurgitation   . NICM (nonischemic cardiomyopathy) ([place])   . Pneumonia   . PONV (postoperative nausea and vomiting)    cannot take zofran  . Postmastectomy  lymphedema rt side  . Sentinel node    Past Surgical History:  Procedure Laterality Date  . BREAST EXCISIONAL BIOPSY Left    benign  . CARPAL TUNNEL RELEASE    . CATARACT EXTRACTION Left 07/12/2013  . IR ANGIO INTRA EXTRACRAN SEL COM CAROTID INNOMINATE UNI L MOD SED  07/26/2019  . IR ANGIO VERTEBRAL SEL SUBCLAVIAN INNOMINATE BILAT MOD SED  07/26/2019  . IR CT HEAD LTD  07/26/2019  . IR PERCUTANEOUS ART THROMBECTOMY/INFUSION INTRACRANIAL INC DIAG ANGIO  07/26/2019  . MANDIBLE SURGERY  1991  . MASTECTOMY Right    malignant  . NASAL SINUS SURGERY  1990/2001  . ORIF FINGER FRACTURE  02/14/2012   Procedure: OPEN REDUCTION INTERNAL FIXATION (ORIF) METACARPAL (FINGER) FRACTURE;  Surgeon: Wynonia Sours, MD;  Location: [facility];  Service: Orthopedics;  Laterality: Right;  RIGHT FIFTH   . ORIF METACARPAL FRACTURE  8/13   rt   . ORIF PATELLA Right 11/07/2018   Procedure: OPEN REDUCTION INTERNAL (ORIF) FIXATION PATELLA;  Surgeon: Nicholes Stairs, MD;  Location: [facility];  Service: Orthopedics;  Laterality: Right;  . ORIF PATELLA FRACTURE Right 11/07/2018  . PORT-A-CATH REMOVAL     in and now out  . RADIOLOGY WITH ANESTHESIA N/A 07/26/2019   Procedure: RADIOLOGY WITH ANESTHESIA;  Surgeon: Luanne Bras, MD;  Location: [facility];  Service: Radiology;  Laterality: N/A;  . REPAIR EXTENSOR TENDON Left 05/30/2013   Procedure: RELEASE TRANSPOSITION EXTENSOR POLLICUS LONGUS LEFT WRIST;  Surgeon: Wynonia Sours, MD;  Location: [facility];  Service: Orthopedics;  Laterality: Left;  . rt mastectomy  2004   lymph nodes-7-axillary node dissection  . SHOULDER ARTHROSCOPY WITH ROTATOR CUFF REPAIR AND SUBACROMIAL DECOMPRESSION Left 11/22/2013   Procedure: LEFT SHOULDER ARTHROSCOPY WITH SUBACROMIAL DECOMPRESSION DISTAL CLAVICLE RESECTION REPAIR  ROTATOR CUFF AS NEEDED ;  Surgeon: Cammie Sickle., MD;  Location: [facility];  Service: Orthopedics;  Laterality: Left;  .  WRIST FRACTURE SURGERY     Social History   Tobacco Use  . Smoking status: Never Smoker  . Smokeless tobacco: Never Used  Substance Use Topics  . Alcohol use: No    Alcohol/week: 0.0 standard drinks    Comment: rarely   ROS  Review of Systems  Cardiovascular: Negative for dyspnea on exertion and leg swelling.  Gastrointestinal: Negative for melena.   Objective  Blood pressure 130/69, pulse 74, temperature (!) 95.6 F (35.3 C), temperature source Temporal, resp. rate 16, height 5' 2.5" (1.588 m), weight 162 lb 6.4 oz (73.7 kg), last menstrual period 01/10/2003, SpO2 98 %.  Vitals with BMI 09/27/2019 09/18/2019 09/10/2019  Height 5' 2.5" 5' 2.25" 5\' 2"   Weight 162 lbs 6 oz 168 lbs 3 oz 164 lbs 14 oz  BMI 29.21 [id number]  Systolic [id number]  Diastolic 69 72 56  Pulse 74 81 80     Physical Exam  Neck: No thyromegaly present.  Cardiovascular: Normal rate, regular rhythm, intact distal pulses and normal pulses.  S1 normal, S2 paradoxical splitting present.  No gallop or murmur.  No leg edema, no JVD.  Pulmonary/Chest: Effort normal and breath sounds normal.  Abdominal: Soft. Bowel sounds are normal.  Musculoskeletal:     Cervical back: Neck supple.  Skin: Skin is warm and dry.   Laboratory examination:   Recent Labs    07/31/19 0319 08/07/19 0800 09/27/19 1650  NA 140 139 145*  K 3.6 3.6 4.4  CL 103 104 102  CO2 27 26 28   GLUCOSE 98 88 94  BUN 14 19 17   CREATININE 1.11* 0.95 1.10*  CALCIUM 8.4* 8.8* 9.4  GFRNONAA 51* >60 52*  GFRAA 59* >60 60   estimated creatinine clearance is 46.6 mL/min (A) (by C-G formula based on SCr of 1.1 mg/dL (H)).  CMP Latest Ref Rng & Units 09/27/2019 08/07/2019 07/31/2019  Glucose 65 - 99 mg/dL 94 88 98  BUN 8 - 27 mg/dL 17 19 14   Creatinine 0.57 - 1.00 mg/dL 1.10(H) 0.95 1.11(H)  Sodium 134 - 144 mmol/L 145(H) 139 140  Potassium 3.5 - 5.2 mmol/L 4.4 3.6 3.6  Chloride 96 - 106 mmol/L 102 104 103  CO2 20 - 29 mmol/L 28 26 27   Calcium  8.7 - 10.3 mg/dL 9.4 8.8(L) 8.4(L)  Total Protein 6.5 - 8.1 g/dL - - -  Total Bilirubin 0.3 - 1.2 mg/dL - - -  Alkaline Phos 38 - 126 U/L - - -  AST 15 - 41 U/L - - -  ALT 0 - 44 U/L - - -   CBC Latest Ref Rng & Units 08/07/2019 07/31/2019 07/30/2019  WBC 4.0 - 10.5 K/uL 7.2 7.2 8.9  Hemoglobin 12.0 - 15.0 g/dL 13.1 13.7 14.4  Hematocrit 36.0 - 46.0 % 41.8 41.3 44.3  Platelets 150 - 400 K/uL 273 234 263   Lipid Panel     Component Value Date/Time   CHOL 178 08/07/2019 0800   TRIG 94 08/07/2019 0800  HDL 33 (L) 08/07/2019 0800   CHOLHDL 5.4 08/07/2019 0800   VLDL 19 08/07/2019 0800   LDLCALC 126 (H) 08/07/2019 0800   LDLDIRECT 145.4 05/04/2010 0827   HEMOGLOBIN A1C Lab Results  Component Value Date   HGBA1C 6.2 (H) 07/27/2019   MPG 131.24 07/27/2019   TSH 4.312 04/06/2018   BNP    Component Value Date/Time   BNP 408.4 (H) 09/10/2019 1558   BNP 1,409.9 (H) 07/29/2019 0707   BNP CANCELED 09/23/2015 1253   BNP 60.4 09/23/2015 1253   Medications and allergies   Allergies  Allergen Reactions  . Clonazepam Shortness Of Breath and Other (See Comments)    Heart had a "weird feeling" and difficulty breathing  . Hydrocodone-Acetaminophen Nausea And Vomiting    GI Upset (intolerance) Patient stated 4/28 that she is able to take Hydrocodone  . Rosuvastatin Other (See Comments)    myalgia  . Trazodone And Nefazodone Nausea Only and Other (See Comments)    Felt like "I was burning all over" -patient  . Acetaminophen Nausea Only  . Amoxicillin-Pot Clavulanate Nausea Only    flushing Did it involve swelling of the face/tongue/throat, SOB, or low BP? No Did it involve sudden or severe rash/hives, skin peeling, or any reaction on the inside of your mouth or nose? No Did you need to seek medical attention at a hospital or doctor's office? Unknown When did it last happen?5 years If all above answers are "NO", may proceed with cephalosporin use.   . Cefuroxime Axetil Nausea  Only  . Cephalexin Nausea Only  . Levofloxacin Nausea Only  . Morphine And Related Other (See Comments)    Hallucinations  . Ondansetron Nausea And Vomiting  . Propoxyphene N-Acetaminophen Nausea And Vomiting  . Sulfonamide Derivatives Itching  . Vioxx [Rofecoxib] Nausea And Vomiting  . Aspirin Nausea And Vomiting    Gastrointestinal upset  . Azithromycin Palpitations  . Clarithromycin Nausea Only and Anxiety  . Codeine Itching and Rash  . Milk-Related Compounds Nausea And Vomiting  . Seroquel [Quetiapine Fumarate] Palpitations    Heart racing   . Sulfamethoxazole Rash  . Tramadol Nausea And Vomiting     Current Outpatient Medications  Medication Instructions  . albuterol (VENTOLIN HFA) 108 (90 Base) MCG/ACT inhaler 2 puffs, Inhalation, Every 6 hours PRN, As needed for wheezing or SOB. Wait 5 minutes between inhalations of multiple inhalers.   . clopidogrel (PLAVIX) 75 mg, Oral, Daily  . diazepam (VALIUM) 5 mg, Oral, Daily at bedtime  . furosemide (LASIX) 20 mg, Oral, Daily  . metoprolol succinate (TOPROL-XL) 100 mg, Oral, Daily at bedtime  . Multiple Vitamin (MULTIVITAMIN WITH MINERALS) TABS tablet 1 tablet, Oral, Daily  . mupirocin ointment (BACTROBAN) 2 % APPLY A SMALL AMOUNT TOTTHE AFFECTED AREA 3 TIMES PER DAY. KEEP COVERED WITH NON-STICK GAUZE.  Marland Kitchen oxymetazoline (AFRIN) 0.05 % nasal spray 1 spray, Each Nare, 2 times daily  . triazolam (HALCION) 0.25 MG tablet Oral, Daily at bedtime, 1/2 tablet    Radiology:    Cardiac Studies:   Echocardiogram 07/26/2019:  Normal LV size, severe LV systolic dysfunction, EF 25 to 30% with global hypokinesis. Mild left atrial enlargement. Large left pleural effusion and small pericardial effusion. Compared to 04/06/2018, EF is further reduced from 40 to 45%.  Event Monitor for 30 days Start date 08/06/2019:  The patient's monitoring period was 08/06/2019 - 09/09/2019. Baseline sample showed Sinus Rhythm with a heart rate of 62 bpm.  NSR. No significant arrhythmias noted.  EKG:  EKG 08/24/2019: Normal sinus rhythm with rate of 72 bpm, leftward enlargement, left bundle branch block.  No further analysis. No significant change from  From prior EKG.    Assessment     ICD-10-CM   1. Chronic systolic CHF (congestive heart failure), NYHA class 1 (HCC)  [id number] Basic metabolic panel    sacubitril-valsartan (ENTRESTO) 49-51 mg per tablet  2. LBBB (left bundle branch block)  I44.7   3. Acute ischemic cerebrovascular accident (CVA) involving anterior cerebral artery [facility])  I63.529   4. Atrial fibrillation, unspecified type ([place])  I48.91     Meds ordered this encounter  Medications  . sacubitril-valsartan (ENTRESTO) 49-51 mg per tablet    Order Specific Question:   ACE-inhibitors have NOT been administered in the past 36-hours.    Answer:   YES (confirmed by ordering provider)    Medications Discontinued During This Encounter  Medication Reason  . NON FORMULARY Patient Preference  . dextromethorphan-guaiFENesin (ROBITUSSIN-DM) 10-100 MG/5ML liquid No longer needed (for PRN medications)  . mupirocin cream (BACTROBAN) 2 % Duplicate  . sacubitril-valsartan (ENTRESTO) 24-26 MG Change in therapy    Recommendations:   Kylie Tucker  is a 69 y.o. Caucasian female with hypertension, hyperlipidemia, hyperglycemia, chronic LBBB, nonischemic cardiomyopathy secondary to anthracycline therapy for breast cancer diagnosed in 2011, multiple medication allergies, admitted to the hospital on 07/26/2019 with left anterior cerebral circulation embolic CVA, underwent successful endovascular revascularization of the left anterior cerebral artery on 07/27/2019 with resolution of the right sided weakness. Presently on plavix alone.  Long-term plan would be to perform repeat echocardiogram on maximal medical therapy and if no improvement in EF to consider BiV ICD otherwise if EF improved to implanted loop recorder.  Her medications were  reviewed, she is on appropriate medical therapy, I obtain BMP which revealed stable serum creatinine.  Will increase Entresto to 49/51 mg dose twice daily.  Samples were given to the patient and clear-cut instructions given to the patient.  Patient will return in 10 to 12 days for follow-up for further titration of the medication and f/u CHF.  Adrian Prows, MD, [facility] 09/29/2019, 5:37 PM [facility] [place] Office: [phone]

## 2019-09-28 ENCOUNTER — Telehealth: Payer: Self-pay

## 2019-09-28 LAB — BASIC METABOLIC PANEL
BUN/Creatinine Ratio: 15 (ref 12–28)
BUN: 17 mg/dL (ref 8–27)
CO2: 28 mmol/L (ref 20–29)
Calcium: 9.4 mg/dL (ref 8.7–10.3)
Chloride: 102 mmol/L (ref 96–106)
Creatinine, Ser: 1.1 mg/dL — ABNORMAL HIGH (ref 0.57–1.00)
GFR calc Af Amer: 60 mL/min/{1.73_m2} (ref >59)
GFR calc non Af Amer: 52 mL/min/{1.73_m2} — ABNORMAL LOW (ref >59)
Glucose: 94 mg/dL (ref 65–99)
Potassium: 4.4 mmol/L (ref 3.5–5.2)
Sodium: 145 mmol/L — ABNORMAL HIGH (ref 134–144)

## 2019-09-28 NOTE — Telephone Encounter (Signed)
Appt made for 10/22/2019 @ 10am..Marland KitchenMarland KitchenFirst available on JG's schedule close to 2wks from [facility] 09/27/2019.

## 2019-09-29 MED ORDER — SACUBITRIL-VALSARTAN 49-51 MG PO TABS: 1.0000 | ORAL_TABLET | Freq: Two times a day (BID) | ORAL | Status: DC

## 2019-10-03 DIAGNOSIS — I130 Hypertensive heart and chronic kidney disease with heart failure and stage 1 through stage 4 chronic kidney disease, or unspecified chronic kidney disease: Secondary | ICD-10-CM | POA: Diagnosis not present

## 2019-10-03 DIAGNOSIS — I5042 Chronic combined systolic (congestive) and diastolic (congestive) heart failure: Secondary | ICD-10-CM | POA: Diagnosis not present

## 2019-10-03 DIAGNOSIS — I69351 Hemiplegia and hemiparesis following cerebral infarction affecting right dominant side: Secondary | ICD-10-CM | POA: Diagnosis not present

## 2019-10-03 DIAGNOSIS — J449 Chronic obstructive pulmonary disease, unspecified: Secondary | ICD-10-CM | POA: Diagnosis not present

## 2019-10-15 NOTE — Progress Notes (Signed)
I agree with the above plan 

## 2019-10-22 ENCOUNTER — Encounter: Payer: Self-pay | Admitting: Cardiology

## 2019-10-22 ENCOUNTER — Other Ambulatory Visit: Payer: Self-pay

## 2019-10-22 ENCOUNTER — Ambulatory Visit: Payer: PPO | Admitting: Cardiology

## 2019-10-22 VITALS — BP 165/88 | HR 74 | Temp 97.7°F | Resp 16 | Ht 62.5 in | Wt 169.8 lb

## 2019-10-22 DIAGNOSIS — E7800 Pure hypercholesterolemia, unspecified: Secondary | ICD-10-CM

## 2019-10-22 DIAGNOSIS — I63529 Cerebral infarction due to unspecified occlusion or stenosis of unspecified anterior cerebral artery: Secondary | ICD-10-CM | POA: Diagnosis not present

## 2019-10-22 DIAGNOSIS — I5022 Chronic systolic (congestive) heart failure: Secondary | ICD-10-CM

## 2019-10-22 DIAGNOSIS — I447 Left bundle-branch block, unspecified: Secondary | ICD-10-CM

## 2019-10-22 DIAGNOSIS — I10 Essential (primary) hypertension: Secondary | ICD-10-CM | POA: Diagnosis not present

## 2019-10-22 MED ORDER — METOPROLOL SUCCINATE ER 200 MG PO TB24: 200.0000 mg | ORAL_TABLET | Freq: Every day | ORAL | 1 refills | Status: DC

## 2019-10-22 MED ORDER — ENTRESTO 49-51 MG PO TABS: 1.0000 | ORAL_TABLET | Freq: Two times a day (BID) | ORAL | 3 refills | Status: DC

## 2019-10-22 NOTE — Progress Notes (Signed)
Primary Physician/Referring:  Redmond School, MD  Patient ID: Kylie Tucker, female    DOB: [birth year], 69 y.o.   MRN: [id number]  Chief Complaint  Patient presents with  . Congestive Heart Failure  . Hypertension  . Follow-up    2 week   HPI:    Kylie Tucker  is a 69 y.o.  Caucasian female with hypertension, hyperlipidemia, hyperglycemia, chronic LBBB, nonischemic cardiomyopathy secondary to anthracycline therapy for breast cancer diagnosed in 2011, multiple medication allergies, admitted to the hospital on 07/26/2019 with left anterior cerebral circulation embolic CVA, underwent successful endovascular revascularization of the left anterior cerebral artery on 07/27/2019 with resolution of the right sided weakness. Presently on plavix alone. Most of her neurologic deficits are essentially resolved.  30 day event monitor was unyielding.   EP consultation was also obtained who recommended event monitor for 2 weeks and rechecking LVEF by echocardiogram and if EF is improved no further therapy and implant  loop recorder for AF monitoring, if EF is not improved, to consider BiV ICD implantation.  She now presents for 2-week follow-up, she is tolerating Entresto without any dizziness, side effects.  She has not had any new neurologic deficits.  Denies chest pain or dyspnea.  Husband is present.  Past Medical History:  Diagnosis Date  . Allergy   . Anxiety   . Bilateral ovarian cysts    Do yearly ultrasound.  . Cancer ([place]) rt breast   2004/ chemo/ radiation  . Cellulitis and abscess of upper arm and forearm   . Chronic combined systolic and diastolic CHF (congestive heart failure) ([place])   . Dementia ([place])   . Family history of breast cancer   . Family history of rectal cancer   . GERD (gastroesophageal reflux disease)   . H/O Helicobacter infection 2017  . History of right mastectomy 2004   chemo/ radiation  . Hyperlipidemia   . Hypertension   . Insomnia   . Left bundle  branch block 2016  . Migraines   . Moderate mitral regurgitation   . NICM (nonischemic cardiomyopathy) ([place])   . Pneumonia   . PONV (postoperative nausea and vomiting)    cannot take zofran  . Postmastectomy lymphedema rt side  . Sentinel node    Past Surgical History:  Procedure Laterality Date  . BREAST EXCISIONAL BIOPSY Left    benign  . CARPAL TUNNEL RELEASE    . CATARACT EXTRACTION Left 07/12/2013  . IR ANGIO INTRA EXTRACRAN SEL COM CAROTID INNOMINATE UNI L MOD SED  07/26/2019  . IR ANGIO VERTEBRAL SEL SUBCLAVIAN INNOMINATE BILAT MOD SED  07/26/2019  . IR CT HEAD LTD  07/26/2019  . IR PERCUTANEOUS ART THROMBECTOMY/INFUSION INTRACRANIAL INC DIAG ANGIO  07/26/2019  . MANDIBLE SURGERY  1991  . MASTECTOMY Right    malignant  . NASAL SINUS SURGERY  1990/2001  . ORIF FINGER FRACTURE  02/14/2012   Procedure: OPEN REDUCTION INTERNAL FIXATION (ORIF) METACARPAL (FINGER) FRACTURE;  Surgeon: Wynonia Sours, MD;  Location: [facility];  Service: Orthopedics;  Laterality: Right;  RIGHT FIFTH   . ORIF METACARPAL FRACTURE  8/13   rt   . ORIF PATELLA Right 11/07/2018   Procedure: OPEN REDUCTION INTERNAL (ORIF) FIXATION PATELLA;  Surgeon: Nicholes Stairs, MD;  Location: [facility];  Service: Orthopedics;  Laterality: Right;  . ORIF PATELLA FRACTURE Right 11/07/2018  . PORT-A-CATH REMOVAL     in and now out  . RADIOLOGY WITH ANESTHESIA N/A 07/26/2019  Procedure: RADIOLOGY WITH ANESTHESIA;  Surgeon: Luanne Bras, MD;  Location: [facility];  Service: Radiology;  Laterality: N/A;  . REPAIR EXTENSOR TENDON Left 05/30/2013   Procedure: RELEASE TRANSPOSITION EXTENSOR POLLICUS LONGUS LEFT WRIST;  Surgeon: Wynonia Sours, MD;  Location: [facility];  Service: Orthopedics;  Laterality: Left;  . rt mastectomy  2004   lymph nodes-7-axillary node dissection  . SHOULDER ARTHROSCOPY WITH ROTATOR CUFF REPAIR AND SUBACROMIAL DECOMPRESSION Left 11/22/2013   Procedure: LEFT SHOULDER  ARTHROSCOPY WITH SUBACROMIAL DECOMPRESSION DISTAL CLAVICLE RESECTION REPAIR  ROTATOR CUFF AS NEEDED ;  Surgeon: Cammie Sickle., MD;  Location: [facility];  Service: Orthopedics;  Laterality: Left;  . WRIST FRACTURE SURGERY     Social History   Tobacco Use  . Smoking status: Never Smoker  . Smokeless tobacco: Never Used  Substance Use Topics  . Alcohol use: Not Currently    Alcohol/week: 0.0 standard drinks    Comment: rarely  Marital status: Married   ROS  Review of Systems  Cardiovascular: Negative for dyspnea on exertion and leg swelling.  Gastrointestinal: Negative for melena.   Objective  Blood pressure (!) 165/88, pulse 74, temperature 97.7 F (36.5 C), temperature source Temporal, resp. rate 16, height 5' 2.5" (1.588 m), weight 169 lb 12.8 oz (77 kg), last menstrual period 01/10/2003, SpO2 97 %.  Vitals with BMI 10/22/2019 09/27/2019 09/18/2019  Height 5' 2.5" 5' 2.5" 5' 2.25"  Weight 169 lbs 13 oz 162 lbs 6 oz 168 lbs 3 oz  BMI 30.54 40.98 11.91  Systolic 478 295 621  Diastolic 88 69 72  Pulse 74 74 81     Physical Exam  Neck: No thyromegaly present.  Cardiovascular: Normal rate, regular rhythm, intact distal pulses and normal pulses.  S1 normal, S2 paradoxical splitting present.  No gallop or murmur.  No leg edema, no JVD.  Pulmonary/Chest: Effort normal and breath sounds normal.  Abdominal: Soft. Bowel sounds are normal.  Musculoskeletal:     Cervical back: Neck supple.  Skin: Skin is warm and dry.   Laboratory examination:   Recent Labs    07/31/19 0319 08/07/19 0800 09/27/19 1650  NA 140 139 145*  K 3.6 3.6 4.4  CL 103 104 102  CO2 _0 GLUCOSE 98 88 94  BUN _1 CREATININE 1.11* 0.95 1.10*  CALCIUM 8.4* 8.8* 9.4  GFRNONAA 51* >60 52*  GFRAA 59* >60 60   CrCl cannot be calculated (Patient's most recent lab result is older than the maximum 21 days allowed.).  CMP Latest Ref Rng & Units 09/27/2019 08/07/2019 07/31/2019    Glucose 65 - 99 mg/dL 94 88 98  BUN 8 - 27 mg/dL _2 Creatinine 0.57 - 1.00 mg/dL 1.10(H) 0.95 1.11(H)  Sodium 134 - 144 mmol/L 145(H) 139 140  Potassium 3.5 - 5.2 mmol/L 4.4 3.6 3.6  Chloride 96 - 106 mmol/L 102 104 103  CO2 20 - 29 mmol/L _3 Calcium 8.7 - 10.3 mg/dL 9.4 8.8(L) 8.4(L)  Total Protein 6.5 - 8.1 g/dL - - -  Total Bilirubin 0.3 - 1.2 mg/dL - - -  Alkaline Phos 38 - 126 U/L - - -  AST 15 - 41 U/L - - -  ALT 0 - 44 U/L - - -   CBC Latest Ref Rng & Units 08/07/2019 07/31/2019 07/30/2019  WBC 4.0 - 10.5 K/uL 7.2 7.2 8.9  Hemoglobin 12.0 - 15.0 g/dL 13.1  13.7 14.4  Hematocrit 36.0 - 46.0 % 41.8 41.3 44.3  Platelets 150 - 400 K/uL 273 234 263   Lipid Panel     Component Value Date/Time   CHOL 178 08/07/2019 0800   TRIG 94 08/07/2019 0800   HDL 33 (L) 08/07/2019 0800   CHOLHDL 5.4 08/07/2019 0800   VLDL 19 08/07/2019 0800   LDLCALC 126 (H) 08/07/2019 0800   LDLDIRECT 145.4 05/04/2010 0827   HEMOGLOBIN A1C Lab Results  Component Value Date   HGBA1C 6.2 (H) 07/27/2019   MPG 131.24 07/27/2019   TSH 4.312 04/06/2018   BNP    Component Value Date/Time   BNP 408.4 (H) 09/10/2019 1558   BNP 1,409.9 (H) 07/29/2019 0707   BNP CANCELED 09/23/2015 1253   BNP 60.4 09/23/2015 1253   Medications and allergies   Allergies  Allergen Reactions  . Clonazepam Shortness Of Breath and Other (See Comments)    Heart had a "weird feeling" and difficulty breathing  . Hydrocodone-Acetaminophen Nausea And Vomiting    GI Upset (intolerance) Patient stated 4/28 that she is able to take Hydrocodone  . Rosuvastatin Other (See Comments)    myalgia  . Trazodone And Nefazodone Nausea Only and Other (See Comments)    Felt like "I was burning all over" -patient  . Acetaminophen Nausea Only  . Amoxicillin-Pot Clavulanate Nausea Only    flushing Did it involve swelling of the face/tongue/throat, SOB, or low BP? No Did it involve sudden or severe rash/hives, skin peeling,  or any reaction on the inside of your mouth or nose? No Did you need to seek medical attention at a hospital or doctor's office? Unknown When did it last happen?5 years If all above answers are "NO", may proceed with cephalosporin use.   . Cefuroxime Axetil Nausea Only  . Cephalexin Nausea Only  . Levofloxacin Nausea Only  . Morphine And Related Other (See Comments)    Hallucinations  . Ondansetron Nausea And Vomiting  . Propoxyphene N-Acetaminophen Nausea And Vomiting  . Sulfonamide Derivatives Itching  . Vioxx [Rofecoxib] Nausea And Vomiting  . Aspirin Nausea And Vomiting    Gastrointestinal upset  . Azithromycin Palpitations  . Clarithromycin Nausea Only and Anxiety  . Codeine Itching and Rash  . Milk-Related Compounds Nausea And Vomiting  . Seroquel [Quetiapine Fumarate] Palpitations    Heart racing   . Sulfamethoxazole Rash  . Tramadol Nausea And Vomiting     Current Outpatient Medications  Medication Instructions  . clopidogrel (PLAVIX) 75 mg, Oral, Daily  . diazepam (VALIUM) 5 mg, Oral, Daily at bedtime  . furosemide (LASIX) 20 mg, Oral, Daily  . metoprolol (TOPROL-XL) 200 mg, Oral, Daily at bedtime  . Multiple Vitamin (MULTIVITAMIN WITH MINERALS) TABS tablet 1 tablet, Oral, Daily  . oxymetazoline (AFRIN) 0.05 % nasal spray 1 spray, Each Nare, 2 times daily  . sacubitril-valsartan (ENTRESTO) 49-51 MG 1 tablet, Oral, 2 times daily  . triazolam (HALCION) 0.25 MG tablet Oral, Daily at bedtime, 1/2 tablet    Radiology:    Cardiac Studies:   Echocardiogram 07/26/2019:  Normal LV size, severe LV systolic dysfunction, EF 25 to 30% with global hypokinesis. Mild left atrial enlargement. Large left pleural effusion and small pericardial effusion. Compared to 04/06/2018, EF is further reduced from 40 to 45%.  Event Monitor for 30 days Start date 08/06/2019:  The patient's monitoring period was 08/06/2019 - 09/09/2019. Baseline sample showed Sinus Rhythm with a heart  rate of 62 bpm. NSR. No significant arrhythmias noted.  EKG:  EKG 08/24/2019: Normal sinus rhythm with rate of 72 bpm, leftward enlargement, left bundle branch block.  No further analysis. No significant change from  From prior EKG.    Assessment     ICD-10-CM   1. Chronic systolic CHF (congestive heart failure), NYHA class 1 (HCC)  I50.22 metoprolol succinate (TOPROL-XL) 200 MG 24 hr tablet    sacubitril-valsartan (ENTRESTO) 49-51 MG    CMP14+EGFR  2. LBBB (left bundle branch block)  I44.7   3. Acute ischemic cerebrovascular accident (CVA) involving anterior cerebral artery [facility])  I63.529   4. Hypercholesteremia  E78.00 Lipid Panel With LDL/HDL Ratio  5. Primary hypertension  I10 metoprolol succinate (TOPROL-XL) 200 MG 24 hr tablet    Meds ordered this encounter  Medications  . metoprolol succinate (TOPROL-XL) 200 MG 24 hr tablet    Sig: Take 1 tablet (200 mg total) by mouth at bedtime.    Dispense:  30 tablet    Refill:  1  . sacubitril-valsartan (ENTRESTO) 49-51 MG    Sig: Take 1 tablet by mouth 2 (two) times daily.    Dispense:  60 tablet    Refill:  3    Medications Discontinued During This Encounter  Medication Reason  . sacubitril-valsartan (ENTRESTO) 49-51 mg per tablet   . metoprolol succinate (TOPROL-XL) 100 MG 24 hr tablet Reorder  . sacubitril-valsartan (ENTRESTO) 49-51 MG Reorder    Recommendations:   DIERRA RIESGO  is a 69 y.o. Caucasian female with hypertension, hyperlipidemia, hyperglycemia, chronic LBBB, nonischemic cardiomyopathy secondary to anthracycline therapy for breast cancer diagnosed in 2011, multiple medication allergies, admitted to the hospital on 07/26/2019 with left anterior cerebral circulation embolic CVA S/P endovascular revascularization of the left anterior cerebral artery on 07/27/2019 with resolution of the right sided weakness. Presently on plavix alone.  Long-term plan would be to perform repeat echocardiogram on maximal medical  therapy and if no improvement in EF to consider BiV ICD otherwise if EF improves to implanted loop recorder for A. Fib monitoring.  She now presents for up titration of Entresto, tolerating moderate dose.  She is told mild renal insufficiency but asymptomatic and no clinical evidence of heart failure.  Blood pressure is elevated today, will increase metoprolol succinate from 100 mg daily to 200 mg daily.  Try to get heart rate less than 70.  I will see her back in 1 month.  Labs ordered including CMP along with lipid profile testing to follow-up on hyperlipidemia, aggressive management of lipids as indicated in view of recent CVA.    Adrian Prows, MD, [facility] 10/22/2019, 10:21 AM [facility] [place] Office: [phone]

## 2019-10-22 NOTE — Patient Instructions (Signed)
Please get blood draw at [facility] after fasting after midnight.

## 2019-10-25 DIAGNOSIS — I5022 Chronic systolic (congestive) heart failure: Secondary | ICD-10-CM | POA: Diagnosis not present

## 2019-10-25 DIAGNOSIS — E7800 Pure hypercholesterolemia, unspecified: Secondary | ICD-10-CM | POA: Diagnosis not present

## 2019-10-26 LAB — CMP14+EGFR
ALT: 15 IU/L (ref 0–32)
AST: 19 IU/L (ref 0–40)
Albumin/Globulin Ratio: 1.4 (ref 1.2–2.2)
Albumin: 3.9 g/dL (ref 3.8–4.8)
Alkaline Phosphatase: 107 IU/L (ref 39–117)
BUN/Creatinine Ratio: 17 (ref 12–28)
BUN: 21 mg/dL (ref 8–27)
Bilirubin Total: 0.2 mg/dL (ref 0.0–1.2)
CO2: 22 mmol/L (ref 20–29)
Calcium: 9.4 mg/dL (ref 8.7–10.3)
Chloride: 105 mmol/L (ref 96–106)
Creatinine, Ser: 1.24 mg/dL — ABNORMAL HIGH (ref 0.57–1.00)
GFR calc Af Amer: 52 mL/min/{1.73_m2} — ABNORMAL LOW (ref >59)
GFR calc non Af Amer: 45 mL/min/{1.73_m2} — ABNORMAL LOW (ref >59)
Globulin, Total: 2.8 g/dL (ref 1.5–4.5)
Glucose: 80 mg/dL (ref 65–99)
Potassium: 4.4 mmol/L (ref 3.5–5.2)
Sodium: 143 mmol/L (ref 134–144)
Total Protein: 6.7 g/dL (ref 6.0–8.5)

## 2019-10-26 LAB — LIPID PANEL WITH LDL/HDL RATIO
Cholesterol, Total: 262 mg/dL — ABNORMAL HIGH (ref 100–199)
HDL: 54 mg/dL (ref >39)
LDL Chol Calc (NIH): 169 mg/dL — ABNORMAL HIGH (ref 0–99)
LDL/HDL Ratio: 3.1 ratio (ref 0.0–3.2)
Triglycerides: 209 mg/dL — ABNORMAL HIGH (ref 0–149)
VLDL Cholesterol Cal: 39 mg/dL (ref 5–40)

## 2019-10-31 ENCOUNTER — Other Ambulatory Visit: Payer: Self-pay

## 2019-10-31 NOTE — Patient Outreach (Signed)
First telephone outreach attempt to obtain mRS. No answer. Left message for returned call.  Ina Homes [facility] Management Assistant [phone]

## 2019-11-08 ENCOUNTER — Other Ambulatory Visit: Payer: Self-pay

## 2019-11-08 NOTE — Patient Outreach (Signed)
Telephone outreach to patient's caregiver to obtain mRS was successfully completed. MRS=3  Ina Homes [facility] Management Assistant [phone]

## 2019-11-22 NOTE — Progress Notes (Signed)
Primary Physician/Referring:  Redmond School, MD  Patient ID: Kylie Tucker, female    DOB: 1951-04-02, 69 y.o.   MRN: [id number]  Chief Complaint  Patient presents with  . Congestive Heart Failure  . Hypertension  . Follow-up    4 week   HPI:    Kylie Tucker  is a 69 y.o.  Caucasian female with hypertension, hyperlipidemia, hyperglycemia, chronic LBBB, nonischemic cardiomyopathy secondary to anthracycline therapy for breast cancer diagnosed in 2011, multiple medication allergies, admitted to the hospital on 07/26/2019 with left anterior cerebral circulation embolic CVA, underwent successful endovascular revascularization of the left anterior cerebral artery on 07/27/2019 with resolution of the right sided weakness. Presently on plavix alone. Most of her neurologic deficits are essentially resolved.  30 day event monitor was unyielding.    She has no specific complaints today, tolerating all the medications.  Underwent labs and presents for follow-up.  Husband present.  Past Medical History:  Diagnosis Date  . Allergy   . Anxiety   . Bilateral ovarian cysts    Do yearly ultrasound.  . Cancer ([place]) rt breast   2004/ chemo/ radiation  . Cellulitis and abscess of upper arm and forearm   . Chronic combined systolic and diastolic CHF (congestive heart failure) ([place])   . Dementia ([place])   . Family history of breast cancer   . Family history of rectal cancer   . GERD (gastroesophageal reflux disease)   . H/O Helicobacter infection 2017  . History of right mastectomy 2004   chemo/ radiation  . Hyperlipidemia   . Hypertension   . Insomnia   . Left bundle branch block 2016  . Migraines   . Moderate mitral regurgitation   . NICM (nonischemic cardiomyopathy) ([place])   . Pneumonia   . PONV (postoperative nausea and vomiting)    cannot take zofran  . Postmastectomy lymphedema rt side  . Sentinel node    Past Surgical History:  Procedure Laterality Date  . BREAST EXCISIONAL  BIOPSY Left    benign  . CARPAL TUNNEL RELEASE    . CATARACT EXTRACTION Left 07/12/2013  . IR ANGIO INTRA EXTRACRAN SEL COM CAROTID INNOMINATE UNI L MOD SED  07/26/2019  . IR ANGIO VERTEBRAL SEL SUBCLAVIAN INNOMINATE BILAT MOD SED  07/26/2019  . IR CT HEAD LTD  07/26/2019  . IR PERCUTANEOUS ART THROMBECTOMY/INFUSION INTRACRANIAL INC DIAG ANGIO  07/26/2019  . MANDIBLE SURGERY  1991  . MASTECTOMY Right    malignant  . NASAL SINUS SURGERY  1990/2001  . ORIF FINGER FRACTURE  02/14/2012   Procedure: OPEN REDUCTION INTERNAL FIXATION (ORIF) METACARPAL (FINGER) FRACTURE;  Surgeon: Wynonia Sours, MD;  Location: [place];  Service: Orthopedics;  Laterality: Right;  RIGHT FIFTH   . ORIF METACARPAL FRACTURE  8/13   rt   . ORIF PATELLA Right 11/07/2018   Procedure: OPEN REDUCTION INTERNAL (ORIF) FIXATION PATELLA;  Surgeon: Nicholes Stairs, MD;  Location: [place];  Service: Orthopedics;  Laterality: Right;  . ORIF PATELLA FRACTURE Right 11/07/2018  . PORT-A-CATH REMOVAL     in and now out  . RADIOLOGY WITH ANESTHESIA N/A 07/26/2019   Procedure: RADIOLOGY WITH ANESTHESIA;  Surgeon: Luanne Bras, MD;  Location: [place];  Service: Radiology;  Laterality: N/A;  . REPAIR EXTENSOR TENDON Left 05/30/2013   Procedure: RELEASE TRANSPOSITION EXTENSOR POLLICUS LONGUS LEFT WRIST;  Surgeon: Wynonia Sours, MD;  Location: [place];  Service: Orthopedics;  Laterality: Left;  . rt  mastectomy  2004   lymph nodes-7-axillary node dissection  . SHOULDER ARTHROSCOPY WITH ROTATOR CUFF REPAIR AND SUBACROMIAL DECOMPRESSION Left 11/22/2013   Procedure: LEFT SHOULDER ARTHROSCOPY WITH SUBACROMIAL DECOMPRESSION DISTAL CLAVICLE RESECTION REPAIR  ROTATOR CUFF AS NEEDED ;  Surgeon: Cammie Sickle., MD;  Location: [place];  Service: Orthopedics;  Laterality: Left;  . WRIST FRACTURE SURGERY      Family History  Problem Relation Age of Onset  . Heart disease Father   . Heart  disease Mother   . Hypertension Mother   . Rectal cancer Mother        dx in her early 59s  . Colon polyps Mother   . Breast cancer Maternal Aunt        dx in her early 20s  . Heart disease Paternal Uncle   . Heart disease Paternal Grandmother   . Heart disease Paternal Grandfather   . Diabetes Brother   . Breast cancer Maternal Grandmother        dx in her 17s  . Breast cancer Cousin        dx in early 56s  . Breast cancer Cousin 72  . Hepatitis C Son   . Hypertension Son   . Post-traumatic stress disorder Son     Social History   Tobacco Use  . Smoking status: Never Smoker  . Smokeless tobacco: Never Used  Substance Use Topics  . Alcohol use: Not Currently    Alcohol/week: 0.0 standard drinks    Comment: rarely   Marital status: Married   ROS  Review of Systems  Cardiovascular: Negative for dyspnea on exertion and leg swelling.  Gastrointestinal: Negative for melena.   Objective  Blood pressure 140/80, pulse 66, temperature 97.9 F (36.6 C), temperature source Temporal, resp. rate 15, height 5\' 2"  (1.575 m), weight 165 lb (74.8 kg), last menstrual period 01/10/2003, SpO2 98 %.  Vitals with BMI 11/23/2019 10/22/2019 09/27/2019  Height 5\' 2"  5' 2.5" 5' 2.5"  Weight 165 lbs 169 lbs 13 oz 162 lbs 6 oz  BMI 30.17 [id number]  Systolic [id number]  Diastolic 80 88 69  Pulse 66 74 74     Physical Exam  Constitutional: She appears well-developed. No distress.  Mildly obese  Cardiovascular: Normal rate, regular rhythm, intact distal pulses and normal pulses. Exam reveals no gallop.  No murmur heard. S1 normal, S2 paradoxical splitting present.  No gallop or murmur.  No leg edema, no JVD.  Pulmonary/Chest: Effort normal and breath sounds normal. No accessory muscle usage.  Abdominal: Soft. Bowel sounds are normal.   Laboratory examination:   Recent Labs    08/07/19 0800 09/27/19 1650 10/25/19 1136  NA 139 145* 143  K 3.6 4.4 4.4  CL 104 102 105  CO2 26 28 22     GLUCOSE 88 94 80  BUN 19 17 21   CREATININE 0.95 1.10* 1.24*  CALCIUM 8.8* 9.4 9.4  GFRNONAA >60 52* 45*  GFRAA >60 60 52*   CrCl cannot be calculated (Patient's most recent lab result is older than the maximum 21 days allowed.).  CMP Latest Ref Rng & Units 10/25/2019 09/27/2019 08/07/2019  Glucose 65 - 99 mg/dL 80 94 88  BUN 8 - 27 mg/dL 21 17 19   Creatinine 0.57 - 1.00 mg/dL 1.24(H) 1.10(H) 0.95  Sodium 134 - 144 mmol/L 143 145(H) 139  Potassium 3.5 - 5.2 mmol/L 4.4 4.4 3.6  Chloride 96 - 106 mmol/L 105 102 104  CO2  20 - 29 mmol/L 22 28 26   Calcium 8.7 - 10.3 mg/dL 9.4 9.4 8.8(L)  Total Protein 6.0 - 8.5 g/dL 6.7 - -  Total Bilirubin 0.0 - 1.2 mg/dL 0.2 - -  Alkaline Phos 39 - 117 IU/L 107 - -  AST 0 - 40 IU/L 19 - -  ALT 0 - 32 IU/L 15 - -   CBC Latest Ref Rng & Units 08/07/2019 07/31/2019 07/30/2019  WBC 4.0 - 10.5 K/uL 7.2 7.2 8.9  Hemoglobin 12.0 - 15.0 g/dL 13.1 13.7 14.4  Hematocrit 36.0 - 46.0 % 41.8 41.3 44.3  Platelets 150 - 400 K/uL 273 234 263   Lipid Panel     Component Value Date/Time   CHOL 262 (H) 10/25/2019 1136   TRIG 209 (H) 10/25/2019 1136   HDL 54 10/25/2019 1136   CHOLHDL 5.4 08/07/2019 0800   VLDL 19 08/07/2019 0800   LDLCALC 169 (H) 10/25/2019 1136   LDLDIRECT 145.4 05/04/2010 0827   HEMOGLOBIN A1C Lab Results  Component Value Date   HGBA1C 6.2 (H) 07/27/2019   MPG 131.24 07/27/2019   BNP    Component Value Date/Time   BNP 408.4 (H) 09/10/2019 1558   BNP 1,409.9 (H) 07/29/2019 0707   BNP CANCELED 09/23/2015 1253   BNP 60.4 09/23/2015 1253   External Labs:  TSH 4.312 04/06/2018   Medications and allergies   Allergies  Allergen Reactions  . Clonazepam Shortness Of Breath and Other (See Comments)    Heart had a "weird feeling" and difficulty breathing  . Hydrocodone-Acetaminophen Nausea And Vomiting    GI Upset (intolerance) Patient stated 4/28 that she is able to take Hydrocodone  . Rosuvastatin Other (See Comments)    myalgia  .  Trazodone And Nefazodone Nausea Only and Other (See Comments)    Felt like "I was burning all over" -patient  . Acetaminophen Nausea Only  . Amoxicillin-Pot Clavulanate Nausea Only    flushing Did it involve swelling of the face/tongue/throat, SOB, or low BP? No Did it involve sudden or severe rash/hives, skin peeling, or any reaction on the inside of your mouth or nose? No Did you need to seek medical attention at a hospital or doctor's office? Unknown When did it last happen?5 years If all above answers are "NO", may proceed with cephalosporin use.   . Cefuroxime Axetil Nausea Only  . Cephalexin Nausea Only  . Levofloxacin Nausea Only  . Morphine And Related Other (See Comments)    Hallucinations  . Ondansetron Nausea And Vomiting  . Propoxyphene N-Acetaminophen Nausea And Vomiting  . Sulfonamide Derivatives Itching  . Vioxx [Rofecoxib] Nausea And Vomiting  . Aspirin Nausea And Vomiting    Gastrointestinal upset  . Azithromycin Palpitations  . Clarithromycin Nausea Only and Anxiety  . Codeine Itching and Rash  . Milk-Related Compounds Nausea And Vomiting  . Seroquel [Quetiapine Fumarate] Palpitations    Heart racing   . Sulfamethoxazole Rash  . Tramadol Nausea And Vomiting     Current Outpatient Medications  Medication Instructions  . amLODipine (NORVASC) 5 mg, Oral, Daily  . clopidogrel (PLAVIX) 75 mg, Oral, Daily  . diazepam (VALIUM) 5 mg, Oral, Daily at bedtime  . Evolocumab (REPATHA) 140 MG/ML SOSY 2 Syringes, Subcutaneous, Every 14 days  . furosemide (LASIX) 20 mg, Oral, Daily  . metoprolol (TOPROL-XL) 200 mg, Oral, Daily at bedtime  . Multiple Vitamin (MULTIVITAMIN WITH MINERALS) TABS tablet 1 tablet, Oral, Daily  . oxymetazoline (AFRIN) 0.05 % nasal spray 1 spray, Each Nare,  2 times daily  . sacubitril-valsartan (ENTRESTO) 49-51 MG 1 tablet, Oral, 2 times daily  . triazolam (HALCION) 0.25 MG tablet Oral, Daily at bedtime, 1/2 tablet    Radiology:   No  results found.  Cardiac Studies:   Echocardiogram 07/26/2019:  Normal LV size, severe LV systolic dysfunction, EF 25 to 30% with global hypokinesis. Mild left atrial enlargement. Large left pleural effusion and small pericardial effusion. Compared to 04/06/2018, EF is further reduced from 40 to 45%.  IR percutaneous Art Thrombectomy 07/26/2019: Status post endovascular complete revascularization of the left anterior cerebral A2 segment and distally with 1 pass with the 3 mm x 33 mm Trevo ProVue retrieval device, and Penumbra aspiration achieving a TICI 3 revascularization.  Event Monitor for 30 days Start date 08/06/2019:  The patient's monitoring period was 08/06/2019 - 09/09/2019. Baseline sample showed Sinus Rhythm with a heart rate of 62 bpm. NSR. No significant arrhythmias noted.  EKG:  08/24/2019: Normal sinus rhythm with rate of 72 bpm, leftward enlargement, left bundle branch block.  No further analysis. No significant change from  From prior EKG.    Assessment     ICD-10-CM   1. Benign hypertension  I10 amLODipine (NORVASC) 5 MG tablet    PCV ECHOCARDIOGRAM COMPLETE  2. Chronic systolic CHF (congestive heart failure), NYHA class 1 (HCC)  I50.22 PCV ECHOCARDIOGRAM COMPLETE  3. Stage 3a chronic kidney disease  N18.31   4. H/O ischemic left MCA stroke  Z86.73 Evolocumab (REPATHA) 140 MG/ML SOSY  5. Hypercholesteremia  E78.00 Evolocumab (REPATHA) 140 MG/ML SOSY  6. Statin intolerance  Z78.9 Evolocumab (REPATHA) 140 MG/ML SOSY    Meds ordered this encounter  Medications  . amLODipine (NORVASC) 5 MG tablet    Sig: Take 1 tablet (5 mg total) by mouth daily.    Dispense:  30 tablet    Refill:  2  . Evolocumab (REPATHA) 140 MG/ML SOSY    Sig: Inject 2 Syringes into the skin every 14 (fourteen) days.    Dispense:  2.1 mL    Refill:  3    There are no discontinued medications.   Recommendations:   Kylie Tucker  is a 69 y.o. Caucasian female with hypertension,  hyperlipidemia, hyperglycemia, chronic LBBB, nonischemic cardiomyopathy secondary to anthracycline therapy for breast cancer diagnosed in 2011, multiple medication allergies, admitted to the hospital on 07/26/2019 with left anterior cerebral circulation embolic CVA S/P endovascular revascularization of the left anterior cerebral artery on 07/27/2019 with resolution of the right sided weakness. Presently on plavix alone.  Long-term plan would be to perform repeat echocardiogram on maximal medical therapy and if no improvement in EF to consider BiV ICD otherwise if EF improves to implanted loop recorder for A. Fib monitoring.  She is presently on maximum tolerated Entresto, higher doses caused worsening renal function.  He is also on maximum dose of tolerated metoprolol succinate.  Blood pressure is elevated, I have added amlodipine 5 mg daily.  Weight loss was discussed with patient.  I reviewed her lipid profile testing, markedly elevated LDL, she has tried pretty much every statin and had developed severe arthralgias and myalgias.  She has multiple medication intolerances as well.  Best option is to proceed with Repatha and will try to get prior approval for the same, prescription sent today.  I would like to see her back in 1 month with repeat echocardiogram to follow-up on LVEF.  Adrian Prows, MD, [facility] 11/23/2019, 2:11 PM [facility] [place] Pager: [phone] Office: [phone]

## 2019-11-23 ENCOUNTER — Other Ambulatory Visit: Payer: Self-pay

## 2019-11-23 ENCOUNTER — Encounter: Payer: Self-pay | Admitting: Cardiology

## 2019-11-23 ENCOUNTER — Ambulatory Visit: Payer: PPO | Admitting: Cardiology

## 2019-11-23 VITALS — BP 140/80 | HR 66 | Temp 97.9°F | Resp 15 | Ht 62.0 in | Wt 165.0 lb

## 2019-11-23 DIAGNOSIS — N1831 Chronic kidney disease, stage 3a: Secondary | ICD-10-CM

## 2019-11-23 DIAGNOSIS — Z8673 Personal history of transient ischemic attack (TIA), and cerebral infarction without residual deficits: Secondary | ICD-10-CM | POA: Diagnosis not present

## 2019-11-23 DIAGNOSIS — Z789 Other specified health status: Secondary | ICD-10-CM | POA: Diagnosis not present

## 2019-11-23 DIAGNOSIS — I10 Essential (primary) hypertension: Secondary | ICD-10-CM | POA: Diagnosis not present

## 2019-11-23 DIAGNOSIS — E7800 Pure hypercholesterolemia, unspecified: Secondary | ICD-10-CM

## 2019-11-23 DIAGNOSIS — I5022 Chronic systolic (congestive) heart failure: Secondary | ICD-10-CM

## 2019-11-23 MED ORDER — REPATHA 140 MG/ML ~~LOC~~ SOSY: 2.0000 | PREFILLED_SYRINGE | SUBCUTANEOUS | 3 refills | Status: DC

## 2019-11-23 MED ORDER — AMLODIPINE BESYLATE 5 MG PO TABS: 5.0000 mg | ORAL_TABLET | Freq: Every day | ORAL | 2 refills | Status: DC

## 2019-11-29 ENCOUNTER — Other Ambulatory Visit: Payer: PPO

## 2019-11-30 ENCOUNTER — Ambulatory Visit: Payer: PPO

## 2019-11-30 ENCOUNTER — Other Ambulatory Visit: Payer: Self-pay

## 2019-11-30 DIAGNOSIS — I10 Essential (primary) hypertension: Secondary | ICD-10-CM | POA: Diagnosis not present

## 2019-11-30 DIAGNOSIS — I5022 Chronic systolic (congestive) heart failure: Secondary | ICD-10-CM

## 2019-12-03 ENCOUNTER — Other Ambulatory Visit: Payer: Self-pay

## 2019-12-10 DIAGNOSIS — C50111 Malignant neoplasm of central portion of right female breast: Secondary | ICD-10-CM | POA: Diagnosis not present

## 2019-12-10 DIAGNOSIS — M5030 Other cervical disc degeneration, unspecified cervical region: Secondary | ICD-10-CM | POA: Diagnosis not present

## 2019-12-10 DIAGNOSIS — I10 Essential (primary) hypertension: Secondary | ICD-10-CM | POA: Diagnosis not present

## 2019-12-10 DIAGNOSIS — Z170 Estrogen receptor positive status [ER+]: Secondary | ICD-10-CM | POA: Diagnosis not present

## 2019-12-12 ENCOUNTER — Other Ambulatory Visit: Payer: Self-pay

## 2019-12-12 ENCOUNTER — Ambulatory Visit: Payer: PPO | Admitting: Cardiology

## 2019-12-12 DIAGNOSIS — E7800 Pure hypercholesterolemia, unspecified: Secondary | ICD-10-CM

## 2019-12-12 MED ORDER — EVOLOCUMAB 140 MG/ML ~~LOC~~ SOAJ
140.0000 mg | Freq: Once | SUBCUTANEOUS | Status: AC
  Administered 2019-12-12: 140 mg via SUBCUTANEOUS

## 2019-12-12 NOTE — Progress Notes (Signed)
Administration Action Time Recorded Time Documented By Site Comment Reason Patient Supplied  Given : 140 mg :  : Subcutaneous 12/12/19 1322 12/12/19 1323 Mares, Lesley Left Lower Abdomen   No     ICD-10-CM   1. Hypercholesteremia  E78.00 Evolocumab SOAJ 140 mg    Adrian Prows, MD, [facility] 12/12/2019, 6:37 PM [facility]. [place] Office: [phone]

## 2019-12-17 ENCOUNTER — Encounter: Payer: Self-pay | Admitting: Cardiology

## 2019-12-17 ENCOUNTER — Other Ambulatory Visit: Payer: Self-pay

## 2019-12-17 ENCOUNTER — Ambulatory Visit: Payer: PPO | Admitting: Cardiology

## 2019-12-17 VITALS — BP 156/60 | HR 70 | Resp 16 | Ht 62.0 in | Wt 174.0 lb

## 2019-12-17 DIAGNOSIS — I447 Left bundle-branch block, unspecified: Secondary | ICD-10-CM

## 2019-12-17 DIAGNOSIS — I639 Cerebral infarction, unspecified: Secondary | ICD-10-CM

## 2019-12-17 DIAGNOSIS — I5022 Chronic systolic (congestive) heart failure: Secondary | ICD-10-CM

## 2019-12-17 DIAGNOSIS — I10 Essential (primary) hypertension: Secondary | ICD-10-CM

## 2019-12-17 DIAGNOSIS — E7800 Pure hypercholesterolemia, unspecified: Secondary | ICD-10-CM | POA: Diagnosis not present

## 2019-12-17 MED ORDER — AMLODIPINE BESYLATE 10 MG PO TABS: 10.0000 mg | ORAL_TABLET | Freq: Every day | ORAL | 3 refills | Status: AC

## 2019-12-17 NOTE — Progress Notes (Signed)
Primary Physician/Referring:  Redmond School, MD  Patient ID: Kylie Tucker, female    DOB: 12-27-1950, 69 y.o.   MRN: [id number]  Chief Complaint  Patient presents with  . Follow-up    4 week  . Cardiomyopathy  . Hypertension  . Hyperlipidemia   HPI:    Kylie Tucker  is a 69 y.o.  Caucasian female with hypertension, hyperlipidemia, hyperglycemia, chronic LBBB, nonischemic cardiomyopathy secondary to anthracycline therapy for breast cancer diagnosed in 2011, multiple medication allergies, statin intolerance, presently on Repatha, admitted to the hospital on 07/26/2019 with left anterior cerebral circulation embolic CVA, S/P endovascular revascularization of the left anterior cerebral artery on 07/27/2019 with resolution of the right sided weakness. Presently on plavix alone.  30 day event monitor was unyielding.    She has no specific complaints today, tolerating all the medications. Husband present.   Past Medical History:  Diagnosis Date  . Allergy   . Anxiety   . Bilateral ovarian cysts    Do yearly ultrasound.  . Cancer ([place]) rt breast   2004/ chemo/ radiation  . Cellulitis and abscess of upper arm and forearm   . Chronic combined systolic and diastolic CHF (congestive heart failure) ([place])   . Dementia ([place])   . Family history of breast cancer   . Family history of rectal cancer   . GERD (gastroesophageal reflux disease)   . H/O Helicobacter infection 2017  . History of right mastectomy 2004   chemo/ radiation  . Hyperlipidemia   . Hypertension   . Insomnia   . Left bundle branch block 2016  . Migraines   . Moderate mitral regurgitation   . NICM (nonischemic cardiomyopathy) ([place])   . Pneumonia   . PONV (postoperative nausea and vomiting)    cannot take zofran  . Postmastectomy lymphedema rt side  . Sentinel node    Past Surgical History:  Procedure Laterality Date  . BREAST EXCISIONAL BIOPSY Left    benign  . CARPAL TUNNEL RELEASE    . CATARACT  EXTRACTION Left 07/12/2013  . IR ANGIO INTRA EXTRACRAN SEL COM CAROTID INNOMINATE UNI L MOD SED  07/26/2019  . IR ANGIO VERTEBRAL SEL SUBCLAVIAN INNOMINATE BILAT MOD SED  07/26/2019  . IR CT HEAD LTD  07/26/2019  . IR PERCUTANEOUS ART THROMBECTOMY/INFUSION INTRACRANIAL INC DIAG ANGIO  07/26/2019  . MANDIBLE SURGERY  1991  . MASTECTOMY Right    malignant  . NASAL SINUS SURGERY  1990/2001  . ORIF FINGER FRACTURE  02/14/2012   Procedure: OPEN REDUCTION INTERNAL FIXATION (ORIF) METACARPAL (FINGER) FRACTURE;  Surgeon: Wynonia Sours, MD;  Location: [facility];  Service: Orthopedics;  Laterality: Right;  RIGHT FIFTH   . ORIF METACARPAL FRACTURE  8/13   rt   . ORIF PATELLA Right 11/07/2018   Procedure: OPEN REDUCTION INTERNAL (ORIF) FIXATION PATELLA;  Surgeon: Nicholes Stairs, MD;  Location: [facility];  Service: Orthopedics;  Laterality: Right;  . ORIF PATELLA FRACTURE Right 11/07/2018  . PORT-A-CATH REMOVAL     in and now out  . RADIOLOGY WITH ANESTHESIA N/A 07/26/2019   Procedure: RADIOLOGY WITH ANESTHESIA;  Surgeon: Luanne Bras, MD;  Location: [facility];  Service: Radiology;  Laterality: N/A;  . REPAIR EXTENSOR TENDON Left 05/30/2013   Procedure: RELEASE TRANSPOSITION EXTENSOR POLLICUS LONGUS LEFT WRIST;  Surgeon: Wynonia Sours, MD;  Location: [facility];  Service: Orthopedics;  Laterality: Left;  . rt mastectomy  2004   lymph nodes-7-axillary node dissection  .  SHOULDER ARTHROSCOPY WITH ROTATOR CUFF REPAIR AND SUBACROMIAL DECOMPRESSION Left 11/22/2013   Procedure: LEFT SHOULDER ARTHROSCOPY WITH SUBACROMIAL DECOMPRESSION DISTAL CLAVICLE RESECTION REPAIR  ROTATOR CUFF AS NEEDED ;  Surgeon: Cammie Sickle., MD;  Location: [facility];  Service: Orthopedics;  Laterality: Left;  . WRIST FRACTURE SURGERY      Family History  Problem Relation Age of Onset  . Heart disease Father   . Heart disease Mother   . Hypertension Mother   . Rectal cancer Mother          dx in her early 57s  . Colon polyps Mother   . Breast cancer Maternal Aunt        dx in her early 35s  . Heart disease Paternal Uncle   . Heart disease Paternal Grandmother   . Heart disease Paternal Grandfather   . Diabetes Brother   . Breast cancer Maternal Grandmother        dx in her 73s  . Breast cancer Cousin        dx in early 40s  . Breast cancer Cousin 71  . Hepatitis C Son   . Hypertension Son   . Post-traumatic stress disorder Son     Social History   Tobacco Use  . Smoking status: Never Smoker  . Smokeless tobacco: Never Used  Substance Use Topics  . Alcohol use: Not Currently    Alcohol/week: 0.0 standard drinks    Comment: rarely   Marital status: Married   ROS  Review of Systems  Cardiovascular: Negative for dyspnea on exertion and leg swelling.  Gastrointestinal: Negative for melena.   Objective  Blood pressure (!) 156/60, pulse 70, resp. rate 16, height 5\' 2"  (1.575 m), weight 174 lb (78.9 kg), last menstrual period 01/10/2003, SpO2 96 %.  Vitals with BMI 12/17/2019 11/23/2019 10/22/2019  Height 5\' 2"  5\' 2"  5' 2.5"  Weight 174 lbs 165 lbs 169 lbs 13 oz  BMI 31.82 24.09 73.53  Systolic 299 242 683  Diastolic 60 80 88  Pulse 70 66 74     Physical Exam  Constitutional: She appears well-developed. No distress.  Mildly obese  Cardiovascular: Normal rate, regular rhythm, intact distal pulses and normal pulses. Exam reveals no gallop.  No murmur heard. S1 normal, S2 paradoxical splitting present.  No gallop or murmur.  No leg edema, no JVD.  Pulmonary/Chest: Effort normal and breath sounds normal. No accessory muscle usage.  Abdominal: Soft. Bowel sounds are normal.   Laboratory examination:   Recent Labs    08/07/19 0800 09/27/19 1650 10/25/19 1136  NA 139 145* 143  K 3.6 4.4 4.4  CL 104 102 105  CO2 26 28 22   GLUCOSE 88 94 80  BUN 19 17 21   CREATININE 0.95 1.10* 1.24*  CALCIUM 8.8* 9.4 9.4  GFRNONAA >60 52* 45*  GFRAA >60 60 52*    CrCl cannot be calculated (Patient's most recent lab result is older than the maximum 21 days allowed.).  CMP Latest Ref Rng & Units 10/25/2019 09/27/2019 08/07/2019  Glucose 65 - 99 mg/dL 80 94 88  BUN 8 - 27 mg/dL 21 17 19   Creatinine 0.57 - 1.00 mg/dL 1.24(H) 1.10(H) 0.95  Sodium 134 - 144 mmol/L 143 145(H) 139  Potassium 3.5 - 5.2 mmol/L 4.4 4.4 3.6  Chloride 96 - 106 mmol/L 105 102 104  CO2 20 - 29 mmol/L 22 28 26   Calcium 8.7 - 10.3 mg/dL 9.4 9.4 8.8(L)  Total Protein 6.0 -  8.5 g/dL 6.7 - -  Total Bilirubin 0.0 - 1.2 mg/dL 0.2 - -  Alkaline Phos 39 - 117 IU/L 107 - -  AST 0 - 40 IU/L 19 - -  ALT 0 - 32 IU/L 15 - -   CBC Latest Ref Rng & Units 08/07/2019 07/31/2019 07/30/2019  WBC 4.0 - 10.5 K/uL 7.2 7.2 8.9  Hemoglobin 12.0 - 15.0 g/dL 13.1 13.7 14.4  Hematocrit 36.0 - 46.0 % 41.8 41.3 44.3  Platelets 150 - 400 K/uL 273 234 263   Lipid Panel     Component Value Date/Time   CHOL 262 (H) 10/25/2019 1136   TRIG 209 (H) 10/25/2019 1136   HDL 54 10/25/2019 1136   CHOLHDL 5.4 08/07/2019 0800   VLDL 19 08/07/2019 0800   LDLCALC 169 (H) 10/25/2019 1136   LDLDIRECT 145.4 05/04/2010 0827   HEMOGLOBIN A1C Lab Results  Component Value Date   HGBA1C 6.2 (H) 07/27/2019   MPG 131.24 07/27/2019   BNP    Component Value Date/Time   BNP 408.4 (H) 09/10/2019 1558   BNP 1,409.9 (H) 07/29/2019 0707   BNP CANCELED 09/23/2015 1253   BNP 60.4 09/23/2015 1253   External Labs:  Cholesterol, total 156.000 M 04/07/2018 Triglycerides 108.000 11/27/2019 HDL 60.000 11/27/2019 LDL-C 82.000 M 04/07/2018  Glucose Random 85.000 M 04/07/2018  BUN 17.000 M 04/07/2018 Creatinine, Serum 1.120 MG/ 04/07/2018  TSH 4.312 04/06/2018   Medications and allergies   Allergies  Allergen Reactions  . Clonazepam Shortness Of Breath and Other (See Comments)    Heart had a "weird feeling" and difficulty breathing  . Hydrocodone-Acetaminophen Nausea And Vomiting    GI Upset (intolerance) Patient stated  4/28 that she is able to take Hydrocodone  . Rosuvastatin Other (See Comments)    myalgia  . Trazodone And Nefazodone Nausea Only and Other (See Comments)    Felt like "I was burning all over" -patient  . Acetaminophen Nausea Only  . Amoxicillin-Pot Clavulanate Nausea Only    flushing Did it involve swelling of the face/tongue/throat, SOB, or low BP? No Did it involve sudden or severe rash/hives, skin peeling, or any reaction on the inside of your mouth or nose? No Did you need to seek medical attention at a hospital or doctor's office? Unknown When did it last happen?5 years If all above answers are "NO", may proceed with cephalosporin use.   . Cefuroxime Axetil Nausea Only  . Cephalexin Nausea Only  . Levofloxacin Nausea Only  . Morphine And Related Other (See Comments)    Hallucinations  . Ondansetron Nausea And Vomiting  . Propoxyphene N-Acetaminophen Nausea And Vomiting  . Sulfonamide Derivatives Itching  . Vioxx [Rofecoxib] Nausea And Vomiting  . Aspirin Nausea And Vomiting    Gastrointestinal upset  . Azithromycin Palpitations  . Clarithromycin Nausea Only and Anxiety  . Codeine Itching and Rash  . Milk-Related Compounds Nausea And Vomiting  . Seroquel [Quetiapine Fumarate] Palpitations    Heart racing   . Sulfamethoxazole Rash  . Tramadol Nausea And Vomiting     Current Outpatient Medications  Medication Instructions  . amLODipine (NORVASC) 10 mg, Oral, Daily  . calcium carbonate (OSCAL) 1500 (600 Ca) MG TABS tablet Oral  . clopidogrel (PLAVIX) 75 mg, Oral, Daily  . diazepam (VALIUM) 5 mg, Oral, Daily at bedtime  . Evolocumab (REPATHA) 140 MG/ML SOSY 2 Syringes, Subcutaneous, Every 14 days  . furosemide (LASIX) 20 mg, Oral, Daily  . metoprolol (TOPROL-XL) 200 mg, Oral, Daily at bedtime  .  Multiple Vitamin (MULTIVITAMIN WITH MINERALS) TABS tablet 1 tablet, Oral, Daily  . oxymetazoline (AFRIN) 0.05 % nasal spray 1 spray, Each Nare, 2 times daily  .  sacubitril-valsartan (ENTRESTO) 49-51 MG 1 tablet, Oral, 2 times daily  . triazolam (HALCION) 0.25 MG tablet Oral, Daily at bedtime, 1/2 tablet    Medications Discontinued During This Encounter  Medication Reason  . amLODipine (NORVASC) 5 MG tablet Reorder    Radiology:   No results found.  Cardiac Studies:   IR percutaneous Art Thrombectomy 07/26/2019: Status post endovascular complete revascularization of the left anterior cerebral A2 segment and distally with 1 pass with the 3 mm x 33 mm Trevo ProVue retrieval device, and Penumbra aspiration achieving a TICI 3 revascularization.  Event Monitor for 30 days Start date 08/06/2019:  The patient's monitoring period was 08/06/2019 - 09/09/2019. Baseline sample showed Sinus Rhythm with a heart rate of 62 bpm. NSR. No significant arrhythmias noted.  Echocardiogram 11/30/2019:  Left ventricle cavity is mildly dilated. Mild concentric hypertrophy of the left ventricle. Normal global wall motion. Doppler evidence of grade I  (impaired) diastolic dysfunction, elevated LAP. Severely depressed LV systolic function with visual EF 20-25%. Calculated EF 31%.  Left atrial cavity is moderately dilated at 4.4 cm.  Structurally normal aortic valve. Trace aortic regurgitation.  Structurally normal appearing mitral valve. Moderate (Grade II) mitral regurgitation.  Structurally normal tricuspid valve. Mild tricuspid regurgitation. No evidence of pulmonary hypertension.  Structurally normal pulmonic valve. Mild pulmonic regurgitation.  Insignificant pericardial effusion with clear fluids.  Overall no significant change from 07/26/2019.   EKG:  08/24/2019: Normal sinus rhythm with rate of 72 bpm, leftward enlargement, left bundle branch block.  No further analysis. No significant change from  From prior EKG.    Assessment     ICD-10-CM   1. Chronic systolic CHF (congestive heart failure), NYHA class 1 (HCC)  I50.22   2. LBBB (left bundle branch  block)  I44.7   3. Cryptogenic stroke (HCC)  I63.9   4. Hypercholesteremia  E78.00   5. Benign hypertension  I10 amLODipine (NORVASC) 10 MG tablet     Recommendations:   RAINI TILEY  is a 69 y.o. Caucasian female with hypertension, hyperlipidemia, hyperglycemia, chronic LBBB, nonischemic cardiomyopathy secondary to anthracycline therapy for breast cancer diagnosed in 2011, multiple medication allergies, statin intolerance, presently on Repatha, admitted to the hospital on 07/26/2019 with left anterior cerebral circulation embolic CVA, S/P endovascular revascularization of the left anterior cerebral artery on 07/27/2019 with resolution of the right sided weakness. Presently on plavix alone.  30 day event monitor was unyielding.    Long-term plan would be to perform repeat echocardiogram on maximal medical therapy and if no improvement in EF to consider BiV ICD otherwise if EF improves to implanted loop recorder for A. Fib monitoring.  She is presently on maximum tolerated Entresto, could not increase the dose to the highest dose due to development of hyperkalemia and renal insufficiency.  Blood pressure still elevated, she is tolerating amlodipine 5 mg will increase it to 10 mg daily.  There is no clinical evidence of heart failure.  I reviewed the results of the recently performed echocardiogram, we discussed regarding evaluation for ICD versus BiV pacemaker versus loop recorder implantation.  Patient does not want any kind of therapies with regard to device implantation.  Husband is present and all questions answered, I will see her back in 3 months for follow-up.  Adrian Prows, MD, [facility] 12/17/2019, 3:03 PM [facility].  [place] Pager: [phone] Office: [phone]

## 2020-01-07 DIAGNOSIS — F419 Anxiety disorder, unspecified: Secondary | ICD-10-CM | POA: Diagnosis not present

## 2020-01-07 DIAGNOSIS — Z6832 Body mass index (BMI) 32.0-32.9, adult: Secondary | ICD-10-CM | POA: Diagnosis not present

## 2020-01-07 DIAGNOSIS — F329 Major depressive disorder, single episode, unspecified: Secondary | ICD-10-CM | POA: Diagnosis not present

## 2020-01-07 DIAGNOSIS — Z0000 Encounter for general adult medical examination without abnormal findings: Secondary | ICD-10-CM | POA: Diagnosis not present

## 2020-01-07 DIAGNOSIS — J449 Chronic obstructive pulmonary disease, unspecified: Secondary | ICD-10-CM | POA: Diagnosis not present

## 2020-01-07 DIAGNOSIS — J309 Allergic rhinitis, unspecified: Secondary | ICD-10-CM | POA: Diagnosis not present

## 2020-01-07 DIAGNOSIS — N3946 Mixed incontinence: Secondary | ICD-10-CM | POA: Diagnosis not present

## 2020-01-07 DIAGNOSIS — G43B0 Ophthalmoplegic migraine, not intractable: Secondary | ICD-10-CM | POA: Diagnosis not present

## 2020-01-07 DIAGNOSIS — K219 Gastro-esophageal reflux disease without esophagitis: Secondary | ICD-10-CM | POA: Diagnosis not present

## 2020-01-07 DIAGNOSIS — I10 Essential (primary) hypertension: Secondary | ICD-10-CM | POA: Diagnosis not present

## 2020-01-21 ENCOUNTER — Ambulatory Visit: Payer: PPO | Admitting: Adult Health

## 2020-01-25 ENCOUNTER — Other Ambulatory Visit: Payer: Self-pay | Admitting: Cardiology

## 2020-01-25 DIAGNOSIS — I5022 Chronic systolic (congestive) heart failure: Secondary | ICD-10-CM

## 2020-01-25 DIAGNOSIS — I10 Essential (primary) hypertension: Secondary | ICD-10-CM

## 2020-02-08 DIAGNOSIS — E1165 Type 2 diabetes mellitus with hyperglycemia: Secondary | ICD-10-CM | POA: Diagnosis not present

## 2020-02-08 DIAGNOSIS — I10 Essential (primary) hypertension: Secondary | ICD-10-CM | POA: Diagnosis not present

## 2020-02-08 DIAGNOSIS — E7849 Other hyperlipidemia: Secondary | ICD-10-CM | POA: Diagnosis not present

## 2020-03-07 ENCOUNTER — Other Ambulatory Visit: Payer: Self-pay | Admitting: Physician Assistant

## 2020-03-07 DIAGNOSIS — Z1231 Encounter for screening mammogram for malignant neoplasm of breast: Secondary | ICD-10-CM

## 2020-03-20 ENCOUNTER — Ambulatory Visit: Payer: PPO | Admitting: Cardiology

## 2020-03-20 ENCOUNTER — Encounter: Payer: Self-pay | Admitting: Cardiology

## 2020-03-20 ENCOUNTER — Other Ambulatory Visit: Payer: Self-pay

## 2020-03-20 VITALS — BP 130/60 | HR 74 | Resp 14 | Ht 62.0 in | Wt 192.0 lb

## 2020-03-20 DIAGNOSIS — Z789 Other specified health status: Secondary | ICD-10-CM

## 2020-03-20 DIAGNOSIS — Z8673 Personal history of transient ischemic attack (TIA), and cerebral infarction without residual deficits: Secondary | ICD-10-CM | POA: Diagnosis not present

## 2020-03-20 DIAGNOSIS — E7800 Pure hypercholesterolemia, unspecified: Secondary | ICD-10-CM | POA: Diagnosis not present

## 2020-03-20 DIAGNOSIS — I5022 Chronic systolic (congestive) heart failure: Secondary | ICD-10-CM

## 2020-03-20 DIAGNOSIS — I10 Essential (primary) hypertension: Secondary | ICD-10-CM | POA: Diagnosis not present

## 2020-03-20 MED ORDER — REPATHA SURECLICK 140 MG/ML ~~LOC~~ SOAJ: 1.0000 | SUBCUTANEOUS | 3 refills | Status: AC

## 2020-03-20 MED ORDER — ENTRESTO 49-51 MG PO TABS: 1.0000 | ORAL_TABLET | Freq: Two times a day (BID) | ORAL | 3 refills | Status: AC

## 2020-03-20 NOTE — Progress Notes (Signed)
Primary Physician/Referring:  Redmond School, MD  Patient ID: Kylie Tucker, female    DOB: 1951-03-20, 69 y.o.   MRN: [id number]  Chief Complaint  Patient presents with  . Congestive Heart Failure  . Hypertension  . Follow-up    3 month   HPI:    Kylie Tucker  is a 69 y.o.  Caucasian female with hypertension, hyperlipidemia, hyperglycemia, chronic LBBB, nonischemic cardiomyopathy secondary to anthracycline therapy for breast cancer diagnosed in 2011, multiple medication allergies, statin intolerance, presently on Repatha, admitted to the hospital on 07/26/2019 with left anterior cerebral circulation embolic CVA, S/P endovascular revascularization of the left anterior cerebral artery on 07/27/2019 with resolution of the right sided weakness. Presently on plavix alone.  30 day event monitor was unyielding.    She has no specific complaints today, tolerating all the medications.  She was started on Entresto moderate dose as she did not tolerate higher dose due to hyperkalemia, she was also started on Repatha for hyperlipidemia which is tolerating.  Past Medical History:  Diagnosis Date  . Allergy   . Anxiety   . Bilateral ovarian cysts    Do yearly ultrasound.  . Cancer ([place]) rt breast   2004/ chemo/ radiation  . Cellulitis and abscess of upper arm and forearm   . Chronic combined systolic and diastolic CHF (congestive heart failure) ([place])   . Dementia ([place])   . Family history of breast cancer   . Family history of rectal cancer   . GERD (gastroesophageal reflux disease)   . H/O Helicobacter infection 2017  . History of right mastectomy 2004   chemo/ radiation  . Hyperlipidemia   . Hypertension   . Insomnia   . Left bundle branch block 2016  . Migraines   . Moderate mitral regurgitation   . NICM (nonischemic cardiomyopathy) ([place])   . Pneumonia   . PONV (postoperative nausea and vomiting)    cannot take zofran  . Postmastectomy lymphedema rt side  . Sentinel node     Past Surgical History:  Procedure Laterality Date  . BREAST EXCISIONAL BIOPSY Left    benign  . CARPAL TUNNEL RELEASE    . CATARACT EXTRACTION Left 07/12/2013  . IR ANGIO INTRA EXTRACRAN SEL COM CAROTID INNOMINATE UNI L MOD SED  07/26/2019  . IR ANGIO VERTEBRAL SEL SUBCLAVIAN INNOMINATE BILAT MOD SED  07/26/2019  . IR CT HEAD LTD  07/26/2019  . IR PERCUTANEOUS ART THROMBECTOMY/INFUSION INTRACRANIAL INC DIAG ANGIO  07/26/2019  . MANDIBLE SURGERY  1991  . MASTECTOMY Right    malignant  . NASAL SINUS SURGERY  1990/2001  . ORIF FINGER FRACTURE  02/14/2012   Procedure: OPEN REDUCTION INTERNAL FIXATION (ORIF) METACARPAL (FINGER) FRACTURE;  Surgeon: Wynonia Sours, MD;  Location: [place];  Service: Orthopedics;  Laterality: Right;  RIGHT FIFTH   . ORIF METACARPAL FRACTURE  8/13   rt   . ORIF PATELLA Right 11/07/2018   Procedure: OPEN REDUCTION INTERNAL (ORIF) FIXATION PATELLA;  Surgeon: Nicholes Stairs, MD;  Location: [place];  Service: Orthopedics;  Laterality: Right;  . ORIF PATELLA FRACTURE Right 11/07/2018  . PORT-A-CATH REMOVAL     in and now out  . RADIOLOGY WITH ANESTHESIA N/A 07/26/2019   Procedure: RADIOLOGY WITH ANESTHESIA;  Surgeon: Luanne Bras, MD;  Location: [place];  Service: Radiology;  Laterality: N/A;  . REPAIR EXTENSOR TENDON Left 05/30/2013   Procedure: RELEASE TRANSPOSITION EXTENSOR POLLICUS LONGUS LEFT WRIST;  Surgeon: Wynonia Sours, MD;  Location: [place];  Service: Orthopedics;  Laterality: Left;  . rt mastectomy  2004   lymph nodes-7-axillary node dissection  . SHOULDER ARTHROSCOPY WITH ROTATOR CUFF REPAIR AND SUBACROMIAL DECOMPRESSION Left 11/22/2013   Procedure: LEFT SHOULDER ARTHROSCOPY WITH SUBACROMIAL DECOMPRESSION DISTAL CLAVICLE RESECTION REPAIR  ROTATOR CUFF AS NEEDED ;  Surgeon: Cammie Sickle., MD;  Location: [place];  Service: Orthopedics;  Laterality: Left;  . WRIST FRACTURE SURGERY      Family  History  Problem Relation Age of Onset  . Heart disease Father   . Heart disease Mother   . Hypertension Mother   . Rectal cancer Mother        dx in her early 77s  . Colon polyps Mother   . Breast cancer Maternal Aunt        dx in her early 48s  . Heart disease Paternal Uncle   . Heart disease Paternal Grandmother   . Heart disease Paternal Grandfather   . Diabetes Brother   . Breast cancer Maternal Grandmother        dx in her 52s  . Breast cancer Cousin        dx in early 4s  . Breast cancer Cousin 57  . Hepatitis C Son   . Hypertension Son   . Post-traumatic stress disorder Son     Social History   Tobacco Use  . Smoking status: Never Smoker  . Smokeless tobacco: Never Used  Substance Use Topics  . Alcohol use: Not Currently    Alcohol/week: 0.0 standard drinks    Comment: rarely   Marital status: Married   ROS  Review of Systems  Cardiovascular: Negative for dyspnea on exertion and leg swelling.  Gastrointestinal: Negative for melena.   Objective  Blood pressure 130/60, pulse 74, resp. rate 14, height 5\' 2"  (1.575 m), weight 192 lb (87.1 kg), last menstrual period 01/10/2003, SpO2 95 %.  Vitals with BMI 03/20/2020 12/17/2019 11/23/2019  Height 5\' 2"  5\' 2"  5\' 2"   Weight 192 lbs 174 lbs 165 lbs  BMI 35.11 40.08 67.61  Systolic 950 932 671  Diastolic 60 60 80  Pulse 74 70 66     Physical Exam Constitutional:      General: She is not in acute distress.    Appearance: She is well-developed.     Comments: Mildly obese  Cardiovascular:     Rate and Rhythm: Normal rate and regular rhythm.     Pulses: Normal pulses and intact distal pulses.     Heart sounds: No murmur heard.  No gallop.      Comments: S1 normal, S2 paradoxical splitting present.  No gallop or murmur.  No leg edema, no JVD. Pulmonary:     Effort: Pulmonary effort is normal. No accessory muscle usage.     Breath sounds: Normal breath sounds.  Abdominal:     General: Bowel sounds are normal.      Palpations: Abdomen is soft.    Laboratory examination:   Recent Labs    08/07/19 0800 09/27/19 1650 10/25/19 1136  NA 139 145* 143  K 3.6 4.4 4.4  CL 104 102 105  CO2 26 28 22   GLUCOSE 88 94 80  BUN 19 17 21   CREATININE 0.95 1.10* 1.24*  CALCIUM 8.8* 9.4 9.4  GFRNONAA >60 52* 45*  GFRAA >60 60 52*   CrCl cannot be calculated (Patient's most recent lab result is older than the maximum 21 days allowed.).  CMP Latest Ref Rng & Units 10/25/2019 09/27/2019 08/07/2019  Glucose 65 - 99 mg/dL 80 94 88  BUN 8 - 27 mg/dL 21 17 19   Creatinine 0.57 - 1.00 mg/dL 1.24(H) 1.10(H) 0.95  Sodium 134 - 144 mmol/L 143 145(H) 139  Potassium 3.5 - 5.2 mmol/L 4.4 4.4 3.6  Chloride 96 - 106 mmol/L 105 102 104  CO2 20 - 29 mmol/L 22 28 26   Calcium 8.7 - 10.3 mg/dL 9.4 9.4 8.8(L)  Total Protein 6.0 - 8.5 g/dL 6.7 - -  Total Bilirubin 0.0 - 1.2 mg/dL 0.2 - -  Alkaline Phos 39 - 117 IU/L 107 - -  AST 0 - 40 IU/L 19 - -  ALT 0 - 32 IU/L 15 - -   CBC Latest Ref Rng & Units 08/07/2019 07/31/2019 07/30/2019  WBC 4.0 - 10.5 K/uL 7.2 7.2 8.9  Hemoglobin 12.0 - 15.0 g/dL 13.1 13.7 14.4  Hematocrit 36 - 46 % 41.8 41.3 44.3  Platelets 150 - 400 K/uL 273 234 263   Lipid Panel Recent Labs    07/27/19 0651 08/07/19 0800 10/25/19 1136  CHOL 159 178 262*  TRIG 120 94 209*  LDLCALC 100* 126* 169*  VLDL 24 19  --   HDL 35* 33* 54  CHOLHDL 4.5 5.4  --    HEMOGLOBIN A1C Lab Results  Component Value Date   HGBA1C 6.2 (H) 07/27/2019   MPG 131.24 07/27/2019   BNP    Component Value Date/Time   BNP 408.4 (H) 09/10/2019 1558   BNP 1,409.9 (H) 07/29/2019 0707   BNP CANCELED 09/23/2015 1253   BNP 60.4 09/23/2015 1253   External Labs:  Cholesterol, total 156.000 M 04/07/2018 Triglycerides 108.000 11/27/2019 HDL 60.000 11/27/2019 LDL-C 82.000 M 04/07/2018  Glucose Random 85.000 M 04/07/2018  BUN 17.000 M 04/07/2018 Creatinine, Serum 1.120 MG/ 04/07/2018  TSH 4.312 04/06/2018   Medications and  allergies   Allergies  Allergen Reactions  . Clonazepam Shortness Of Breath and Other (See Comments)    Heart had a "weird feeling" and difficulty breathing  . Hydrocodone-Acetaminophen Nausea And Vomiting    GI Upset (intolerance) Patient stated 4/28 that she is able to take Hydrocodone  . Rosuvastatin Other (See Comments)    myalgia  . Trazodone And Nefazodone Nausea Only and Other (See Comments)    Felt like "I was burning all over" -patient  . Acetaminophen Nausea Only  . Amoxicillin-Pot Clavulanate Nausea Only    flushing Did it involve swelling of the face/tongue/throat, SOB, or low BP? No Did it involve sudden or severe rash/hives, skin peeling, or any reaction on the inside of your mouth or nose? No Did you need to seek medical attention at a hospital or doctor's office? Unknown When did it last happen?5 years If all above answers are "NO", may proceed with cephalosporin use.   . Cefuroxime Axetil Nausea Only  . Cephalexin Nausea Only  . Levofloxacin Nausea Only  . Morphine And Related Other (See Comments)    Hallucinations  . Ondansetron Nausea And Vomiting  . Propoxyphene N-Acetaminophen Nausea And Vomiting  . Sulfonamide Derivatives Itching  . Vioxx [Rofecoxib] Nausea And Vomiting  . Aspirin Nausea And Vomiting    Gastrointestinal upset  . Azithromycin Palpitations  . Clarithromycin Nausea Only and Anxiety  . Codeine Itching and Rash  . Milk-Related Compounds Nausea And Vomiting  . Seroquel [Quetiapine Fumarate] Palpitations    Heart racing   . Sulfamethoxazole Rash  . Tramadol Nausea And Vomiting  Outpatient Medications Prior to Visit  Medication Sig Dispense Refill  . amLODipine (NORVASC) 10 MG tablet Take 1 tablet (10 mg total) by mouth daily. 90 tablet 3  . clopidogrel (PLAVIX) 75 MG tablet Take 1 tablet (75 mg total) by mouth daily. 30 tablet 0  . diazepam (VALIUM) 10 MG tablet Take 0.5 tablets (5 mg total) by mouth at bedtime. 4 tablet 0  .  furosemide (LASIX) 20 MG tablet Take 1 tablet (20 mg total) by mouth daily. 30 tablet 0  . metoprolol (TOPROL-XL) 200 MG 24 hr tablet TAKE (1) TABLET BY MOUTH AT BEDTIME. 90 tablet 3  . Multiple Vitamin (MULTIVITAMIN WITH MINERALS) TABS tablet Take 1 tablet by mouth daily.    Marland Kitchen oxymetazoline (AFRIN) 0.05 % nasal spray Place 1 spray into both nostrils 2 (two) times daily.    . triazolam (HALCION) 0.25 MG tablet Take by mouth at bedtime. 1/2 tablet    . Evolocumab (REPATHA) 140 MG/ML SOSY Inject 2 Syringes into the skin every 14 (fourteen) days. 2.1 mL 3  . sacubitril-valsartan (ENTRESTO) 49-51 MG Take 1 tablet by mouth 2 (two) times daily. 60 tablet 3  . calcium carbonate (OSCAL) 1500 (600 Ca) MG TABS tablet Take by mouth.     No facility-administered medications prior to visit.    Radiology:   No results found.  Cardiac Studies:   IR percutaneous Art Thrombectomy 07/26/2019: Status post endovascular complete revascularization of the left anterior cerebral A2 segment and distally with 1 pass with the 3 mm x 33 mm Trevo ProVue retrieval device, and Penumbra aspiration achieving a TICI 3 revascularization.  Event Monitor for 30 days Start date 08/06/2019:  The patient's monitoring period was 08/06/2019 - 09/09/2019. Baseline sample showed Sinus Rhythm with a heart rate of 62 bpm. NSR. No significant arrhythmias noted.  Echocardiogram 11/30/2019:  Left ventricle cavity is mildly dilated. Mild concentric hypertrophy of the left ventricle. Normal global wall motion. Doppler evidence of grade I  (impaired) diastolic dysfunction, elevated LAP. Severely depressed LV systolic function with visual EF 20-25%. Calculated EF 31%.  Left atrial cavity is moderately dilated at 4.4 cm.  Structurally normal aortic valve. Trace aortic regurgitation.  Structurally normal appearing mitral valve. Moderate (Grade II) mitral regurgitation.  Structurally normal tricuspid valve. Mild tricuspid regurgitation.  No evidence of pulmonary hypertension.  Structurally normal pulmonic valve. Mild pulmonic regurgitation.  Insignificant pericardial effusion with clear fluids.  Overall no significant change from 07/26/2019.   EKG:  EKG 03/20/2020: Normal sinus rhythm with rate of 71 bpm, left bundle branch block.  No further analysis.  08/24/2019: Normal sinus rhythm with rate of 72 bpm, leftward enlargement, left bundle branch block.  No further analysis. No significant change from  From prior EKG.    Assessment     ICD-10-CM   1. Chronic systolic CHF (congestive heart failure), NYHA class 1 (HCC)  I50.22 EKG 12-Lead    sacubitril-valsartan (ENTRESTO) 49-51 MG    Lipid Panel With LDL/HDL Ratio    PCV ECHOCARDIOGRAM COMPLETE  2. Benign hypertension  I10   3. H/O ischemic left MCA stroke  Z86.73   4. Hypercholesteremia  E78.00 Evolocumab (REPATHA SURECLICK) 194 MG/ML SOAJ  5. Statin intolerance  Z78.9     Meds ordered this encounter  Medications  . Evolocumab (REPATHA SURECLICK) 174 MG/ML SOAJ    Sig: Inject 1 Syringe into the skin every 14 (fourteen) days.    Dispense:  6 mL    Refill:  3  . sacubitril-valsartan (  ENTRESTO) 49-51 MG    Sig: Take 1 tablet by mouth 2 (two) times daily.    Dispense:  180 tablet    Refill:  3    Medications Discontinued During This Encounter  Medication Reason  . Evolocumab (REPATHA) 588 MG/ML SOSY Duplicate  . sacubitril-valsartan (ENTRESTO) 49-51 MG Reorder    Recommendations:   ALEJANDRA HUNT  is a 69 y.o. Caucasian female with hypertension, hyperlipidemia, hyperglycemia, chronic LBBB, nonischemic cardiomyopathy secondary to anthracycline therapy for breast cancer diagnosed in 2011, multiple medication allergies, statin intolerance, presently on Repatha, admitted to the hospital on 07/26/2019 with left anterior cerebral circulation embolic CVA, S/P endovascular revascularization of the left anterior cerebral artery on 07/27/2019 with resolution of the right  sided weakness. Presently on plavix alone.  30 day event monitor was unyielding.    Long-term plan would be to perform repeat echocardiogram on maximal medical therapy and if no improvement in EF to consider BiV ICD otherwise if EF improves to implanted loop recorder for A. Fib monitoring.  However patient opted not to have any invasive strategy done.  She is now tolerating Repatha, I will repeat lipid profile testing.  Since she is on maximum tolerated dose of heart failure medications, I have recommended repeating the echocardiogram in 3 months and I would like to see her back then.  Could not increase Repatha to maximum dose due to hyperkalemia.  Today there is no clinical evidence of heart failure.  Weight loss was again extensively discussed with the patient.  With regard to heart failure and stroke, she has remained stable.  Blood pressure is now well controlled on amlodipine.   Adrian Prows, MD, [facility] 03/20/2020, 3:00 PM Office: [phone]

## 2020-03-24 ENCOUNTER — Ambulatory Visit: Payer: PPO

## 2020-04-03 ENCOUNTER — Other Ambulatory Visit: Payer: PPO

## 2020-04-10 DIAGNOSIS — I10 Essential (primary) hypertension: Secondary | ICD-10-CM | POA: Diagnosis not present

## 2020-04-10 DIAGNOSIS — F329 Major depressive disorder, single episode, unspecified: Secondary | ICD-10-CM | POA: Diagnosis not present

## 2020-04-10 DIAGNOSIS — Z170 Estrogen receptor positive status [ER+]: Secondary | ICD-10-CM | POA: Diagnosis not present

## 2020-04-10 DIAGNOSIS — M5030 Other cervical disc degeneration, unspecified cervical region: Secondary | ICD-10-CM | POA: Diagnosis not present

## 2020-04-11 DEATH — deceased

## 2020-04-15 ENCOUNTER — Ambulatory Visit: Payer: PPO

## 2020-05-06 ENCOUNTER — Ambulatory Visit: Payer: PPO | Admitting: Obstetrics and Gynecology

## 2020-06-12 ENCOUNTER — Ambulatory Visit: Payer: PPO | Admitting: Cardiology
